# Patient Record
Sex: Female | Born: 1940
Health system: Southern US, Community
[De-identification: ages and names within clinical notes are randomized; demographics above are authoritative.]

## PROBLEM LIST (undated history)

## (undated) DIAGNOSIS — R32 Unspecified urinary incontinence: Secondary | ICD-10-CM

## (undated) DIAGNOSIS — M1990 Unspecified osteoarthritis, unspecified site: Secondary | ICD-10-CM

## (undated) DIAGNOSIS — K269 Duodenal ulcer, unspecified as acute or chronic, without hemorrhage or perforation: Secondary | ICD-10-CM

## (undated) DIAGNOSIS — K429 Umbilical hernia without obstruction or gangrene: Secondary | ICD-10-CM

## (undated) DIAGNOSIS — M722 Plantar fascial fibromatosis: Secondary | ICD-10-CM

## (undated) DIAGNOSIS — R1031 Right lower quadrant pain: Secondary | ICD-10-CM

## (undated) DIAGNOSIS — R7303 Prediabetes: Secondary | ICD-10-CM

## (undated) DIAGNOSIS — I272 Pulmonary hypertension, unspecified: Secondary | ICD-10-CM

## (undated) DIAGNOSIS — G4700 Insomnia, unspecified: Secondary | ICD-10-CM

## (undated) DIAGNOSIS — K219 Gastro-esophageal reflux disease without esophagitis: Secondary | ICD-10-CM

## (undated) DIAGNOSIS — Z981 Arthrodesis status: Secondary | ICD-10-CM

## (undated) DIAGNOSIS — F32A Depression, unspecified: Secondary | ICD-10-CM

## (undated) DIAGNOSIS — J189 Pneumonia, unspecified organism: Secondary | ICD-10-CM

## (undated) DIAGNOSIS — M8580 Other specified disorders of bone density and structure, unspecified site: Secondary | ICD-10-CM

## (undated) DIAGNOSIS — D649 Anemia, unspecified: Secondary | ICD-10-CM

## (undated) DIAGNOSIS — I5189 Other ill-defined heart diseases: Secondary | ICD-10-CM

## (undated) DIAGNOSIS — G4733 Obstructive sleep apnea (adult) (pediatric): Secondary | ICD-10-CM

## (undated) DIAGNOSIS — E119 Type 2 diabetes mellitus without complications: Secondary | ICD-10-CM

## (undated) DIAGNOSIS — G629 Polyneuropathy, unspecified: Secondary | ICD-10-CM

## (undated) DIAGNOSIS — N390 Urinary tract infection, site not specified: Secondary | ICD-10-CM

## (undated) DIAGNOSIS — T8859XA Other complications of anesthesia, initial encounter: Secondary | ICD-10-CM

## (undated) DIAGNOSIS — R0602 Shortness of breath: Secondary | ICD-10-CM

## (undated) DIAGNOSIS — T7840XA Allergy, unspecified, initial encounter: Secondary | ICD-10-CM

## (undated) DIAGNOSIS — Z5189 Encounter for other specified aftercare: Secondary | ICD-10-CM

## (undated) DIAGNOSIS — Z974 Presence of external hearing-aid: Secondary | ICD-10-CM

## (undated) DIAGNOSIS — J449 Chronic obstructive pulmonary disease, unspecified: Secondary | ICD-10-CM

## (undated) DIAGNOSIS — E785 Hyperlipidemia, unspecified: Secondary | ICD-10-CM

## (undated) DIAGNOSIS — G459 Transient cerebral ischemic attack, unspecified: Secondary | ICD-10-CM

## (undated) DIAGNOSIS — G2581 Restless legs syndrome: Secondary | ICD-10-CM

## (undated) DIAGNOSIS — K4090 Unilateral inguinal hernia, without obstruction or gangrene, not specified as recurrent: Secondary | ICD-10-CM

## (undated) DIAGNOSIS — R0600 Dyspnea, unspecified: Secondary | ICD-10-CM

## (undated) DIAGNOSIS — K635 Polyp of colon: Secondary | ICD-10-CM

## (undated) DIAGNOSIS — I10 Essential (primary) hypertension: Secondary | ICD-10-CM

## (undated) DIAGNOSIS — S32010A Wedge compression fracture of first lumbar vertebra, initial encounter for closed fracture: Secondary | ICD-10-CM

## (undated) DIAGNOSIS — J45909 Unspecified asthma, uncomplicated: Secondary | ICD-10-CM

## (undated) DIAGNOSIS — S22080A Wedge compression fracture of T11-T12 vertebra, initial encounter for closed fracture: Secondary | ICD-10-CM

## (undated) DIAGNOSIS — G4730 Sleep apnea, unspecified: Secondary | ICD-10-CM

## (undated) HISTORY — DX: Urinary tract infection, site not specified: N39.0

## (undated) HISTORY — DX: Depression, unspecified: F32.A

## (undated) HISTORY — PX: BACK SURGERY: SHX140

## (undated) HISTORY — DX: Unspecified osteoarthritis, unspecified site: M19.90

## (undated) HISTORY — DX: Hyperlipidemia, unspecified: E78.5

## (undated) HISTORY — DX: Polyp of colon: K63.5

## (undated) HISTORY — DX: Restless legs syndrome: G25.81

## (undated) HISTORY — PX: TUBAL LIGATION: SHX77

## (undated) HISTORY — DX: Type 2 diabetes mellitus without complications: E11.9

## (undated) HISTORY — PX: JOINT REPLACEMENT: SHX530

## (undated) HISTORY — DX: Unspecified urinary incontinence: R32

## (undated) HISTORY — DX: Encounter for other specified aftercare: Z51.89

## (undated) HISTORY — DX: Allergy, unspecified, initial encounter: T78.40XA

## (undated) HISTORY — DX: Shortness of breath: R06.02

## (undated) HISTORY — DX: Prediabetes: R73.03

## (undated) HISTORY — DX: Other specified disorders of bone density and structure, unspecified site: M85.80

## (undated) HISTORY — DX: Duodenal ulcer, unspecified as acute or chronic, without hemorrhage or perforation: K26.9

## (undated) HISTORY — PX: HERNIA REPAIR: SHX51

## (undated) HISTORY — PX: EYE SURGERY: SHX253

## (undated) HISTORY — DX: Gastro-esophageal reflux disease without esophagitis: K21.9

## (undated) HISTORY — DX: Right lower quadrant pain: R10.31

## (undated) HISTORY — DX: Essential (primary) hypertension: I10

---
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
## 1970-02-27 HISTORY — PX: ABDOMINAL HYSTERECTOMY: SHX81

## 1998-09-24 DIAGNOSIS — R7303 Prediabetes: Secondary | ICD-10-CM | POA: Insufficient documentation

## 2000-02-28 HISTORY — PX: SPINAL FUSION: SHX223

## 2000-02-28 HISTORY — PX: BACK SURGERY: SHX140

## 2000-02-28 HISTORY — PX: LUMBAR FUSION: SHX111

## 2000-10-25 DIAGNOSIS — K5730 Diverticulosis of large intestine without perforation or abscess without bleeding: Secondary | ICD-10-CM | POA: Insufficient documentation

## 2012-06-24 DIAGNOSIS — R0602 Shortness of breath: Secondary | ICD-10-CM | POA: Insufficient documentation

## 2012-06-24 DIAGNOSIS — R0609 Other forms of dyspnea: Secondary | ICD-10-CM | POA: Insufficient documentation

## 2016-02-28 HISTORY — PX: HERNIA REPAIR: SHX51

## 2016-07-21 DIAGNOSIS — I2510 Atherosclerotic heart disease of native coronary artery without angina pectoris: Secondary | ICD-10-CM

## 2016-07-21 HISTORY — PX: CARDIAC CATHETERIZATION: SHX172

## 2016-07-21 HISTORY — DX: Atherosclerotic heart disease of native coronary artery without angina pectoris: I25.10

## 2016-12-29 DIAGNOSIS — J449 Chronic obstructive pulmonary disease, unspecified: Secondary | ICD-10-CM | POA: Insufficient documentation

## 2016-12-29 DIAGNOSIS — G4733 Obstructive sleep apnea (adult) (pediatric): Secondary | ICD-10-CM | POA: Insufficient documentation

## 2017-01-05 HISTORY — PX: INGUINAL HERNIA REPAIR: SUR1180

## 2017-01-05 HISTORY — PX: UMBILICAL HERNIA REPAIR: SHX196

## 2017-02-27 HISTORY — PX: LAPAROSCOPIC BILATERAL SALPINGO OOPHERECTOMY: SHX5890

## 2017-02-27 HISTORY — PX: CARDIAC CATHETERIZATION: SHX172

## 2018-01-07 DIAGNOSIS — Z90722 Acquired absence of ovaries, bilateral: Secondary | ICD-10-CM | POA: Insufficient documentation

## 2018-01-07 HISTORY — PX: BILATERAL SALPINGOOPHORECTOMY: SHX1223

## 2018-09-24 ENCOUNTER — Encounter: Payer: Self-pay | Admitting: Gastroenterology

## 2018-10-04 ENCOUNTER — Other Ambulatory Visit: Payer: Self-pay

## 2018-10-04 ENCOUNTER — Encounter: Payer: Self-pay | Admitting: Family Medicine

## 2018-10-04 ENCOUNTER — Ambulatory Visit (INDEPENDENT_AMBULATORY_CARE_PROVIDER_SITE_OTHER): Payer: Medicare Other | Admitting: Family Medicine

## 2018-10-04 VITALS — BP 120/64 | HR 81 | Temp 97.9°F | Ht <= 58 in | Wt 158.9 lb

## 2018-10-04 DIAGNOSIS — E2839 Other primary ovarian failure: Secondary | ICD-10-CM | POA: Diagnosis not present

## 2018-10-04 DIAGNOSIS — R0602 Shortness of breath: Secondary | ICD-10-CM

## 2018-10-04 DIAGNOSIS — Z1231 Encounter for screening mammogram for malignant neoplasm of breast: Secondary | ICD-10-CM

## 2018-10-04 DIAGNOSIS — Z7689 Persons encountering health services in other specified circumstances: Secondary | ICD-10-CM

## 2018-10-04 DIAGNOSIS — Z1239 Encounter for other screening for malignant neoplasm of breast: Secondary | ICD-10-CM

## 2018-10-04 DIAGNOSIS — N3010 Interstitial cystitis (chronic) without hematuria: Secondary | ICD-10-CM | POA: Diagnosis not present

## 2018-10-04 DIAGNOSIS — M79652 Pain in left thigh: Secondary | ICD-10-CM

## 2018-10-04 DIAGNOSIS — R1031 Right lower quadrant pain: Secondary | ICD-10-CM

## 2018-10-04 DIAGNOSIS — G8929 Other chronic pain: Secondary | ICD-10-CM

## 2018-10-04 DIAGNOSIS — G629 Polyneuropathy, unspecified: Secondary | ICD-10-CM

## 2018-10-04 MED ORDER — ALBUTEROL SULFATE HFA 108 (90 BASE) MCG/ACT IN AERS: 2.0000 | INHALATION_SPRAY | RESPIRATORY_TRACT | 1 refills | Status: DC | PRN

## 2018-10-04 NOTE — Patient Instructions (Signed)
Nice to meet you today  I will send your regular medications to [facility]  Please try compression of your left thigh  I have sent in an inhaler to your pharmacy to use prior to exercise  Schedule your Annual wellness visit and a 6 month follow up  You will get a call about your specialist appointments   How to Use a Metered Dose Inhaler A metered dose inhaler is a handheld device for taking medicine that must be breathed into the lungs (inhaled). The device can be used to deliver a variety of inhaled medicines, including:  Quick relief or rescue medicines, such as bronchodilators.  Controller medicines, such as corticosteroids. The medicine is delivered by pushing down on a metal canister to release a preset amount of spray and medicine. Each device contains the amount of medicine that is needed for a preset number of uses (inhalations). Your health care provider may recommend that you use a spacer with your inhaler to help you take the medicine more effectively. A spacer is a plastic tube with a mouthpiece on one end and an opening that connects to the inhaler on the other end. A spacer holds the medicine in a tube for a short time, which allows you to inhale more medicine. What are the risks? If you do not use your inhaler correctly, medicine might not reach your lungs to help you breathe. Inhaler medicine can cause side effects, such as:  Mouth or throat infection.  Cough.  Hoarseness.  Headache.  Nausea and vomiting.  Lung infection (pneumonia) in people who have a lung condition called COPD. How to use a metered dose inhaler without a spacer  1. Remove the cap from the inhaler. 2. If you are using the inhaler for the first time, shake it for 5 seconds, turn it away from your face, then release 4 puffs into the air. This is called priming. 3. Shake the inhaler for 5 seconds. 4. Position the inhaler so the top of the canister faces up. 5. Put your index finger on the top of  the medicine canister. Support the bottom of the inhaler with your thumb. 6. Breathe out normally and as completely as possible, away from the inhaler. 7. Either place the inhaler between your teeth and close your lips tightly around the mouthpiece, or hold the inhaler 1-2 inches (2.5-5 cm) away from your open mouth. Keep your tongue down out of the way. If you are unsure which technique to use, ask your health care provider. 8. Press the canister down with your index finger to release the medicine, then inhale deeply and slowly through your mouth (not your nose) until your lungs are completely filled. Inhaling should take 4-6 seconds. 9. Hold the medicine in your lungs for 5-10 seconds (10 seconds is best). This helps the medicine get into the small airways of your lungs. 10. With your lips in a tight circle (pursed), breathe out slowly. 11. Repeat steps 3-10 until you have taken the number of puffs that your health care provider directed. Wait about 1 minute between puffs or as directed. 12. Put the cap on the inhaler. 13. If you are using a steroid inhaler, rinse your mouth with water, gargle, and spit out the water. Do not swallow the water. How to use a metered dose inhaler with a spacer  1. Remove the cap from the inhaler. 2. If you are using the inhaler for the first time, shake it for 5 seconds, turn it away from your  face, then release 4 puffs into the air. This is called priming. 3. Shake the inhaler for 5 seconds. 4. Place the open end of the spacer onto the inhaler mouthpiece. 5. Position the inhaler so the top of the canister faces up and the spacer mouthpiece faces you. 6. Put your index finger on the top of the medicine canister. Support the bottom of the inhaler and the spacer with your thumb. 7. Breathe out normally and as completely as possible, away from the spacer. 8. Place the spacer between your teeth and close your lips tightly around it. Keep your tongue down out of the way.  9. Press the canister down with your index finger to release the medicine, then inhale deeply and slowly through your mouth (not your nose) until your lungs are completely filled. Inhaling should take 4-6 seconds. 10. Hold the medicine in your lungs for 5-10 seconds (10 seconds is best). This helps the medicine get into the small airways of your lungs. 11. With your lips in a tight circle (pursed), breathe out slowly. 12. Repeat steps 3-11 until you have taken the number of puffs that your health care provider directed. Wait about 1 minute between puffs or as directed. 13. Remove the spacer from the inhaler and put the cap on the inhaler. 14. If you are using a steroid inhaler, rinse your mouth with water, gargle, and spit out the water. Do not swallow the water. Follow these instructions at home:  Take your inhaled medicine only as told by your health care provider. Do not use the inhaler more than directed by your health care provider.  Keep all follow-up visits as told by your health care provider. This is important.  If your inhaler has a counter, you can check it to determine how full your inhaler is. If your inhaler does not have a counter, ask your health care provider when you will need to refill your inhaler and write the refill date on a calendar or on your inhaler canister. Note that you cannot know when an inhaler is empty by shaking it.  Follow directions on the package insert for care and cleaning of your inhaler and spacer. Contact a health care provider if:  Symptoms are only partially relieved with your inhaler.  You are having trouble using your inhaler.  You have an increase in phlegm.  You have headaches. Get help right away if:  You feel little or no relief after using your inhaler.  You have dizziness.  You have a fast heart rate.  You have chills or a fever.  You have night sweats.  There is blood in your phlegm. Summary  A metered dose inhaler is a  handheld device for taking medicine that must be breathed into the lungs (inhaled).  The medicine is delivered by pushing down on a metal canister to release a preset amount of spray and medicine.  Each device contains the amount of medicine that is needed for a preset number of uses (inhalations). This information is not intended to replace advice given to you by your health care provider. Make sure you discuss any questions you have with your health care provider. Document Released: 02/13/2005 Document Revised: 01/26/2017 Document Reviewed: 01/04/2016 Elsevier Patient Education  2020 [employer].

## 2018-10-04 NOTE — Progress Notes (Signed)
Subjective:    Patient ID: Erin Petersen, female    DOB: 1940/05/28, 78 y.o.   MRN: [id number]  HPI This is a 78 yo female who presents today to establish care. She recently moved her from [place]. Lives near her daughter.   Last CPE- 02/2018 Mammo- 10/31/2017 Pap- total hysterectomy Colonoscopy-  2018 Tdap- 2003 Flu- annual Eye- regular Dental- regular Exercise- walks 2x/ day 1 mile with friend  SOB- Since 2014. Always activity induce. Has seen pulmonary. Had slightly abnormal PFTs. Was tried on different inhalers. Has reaction with ? Inhaler. Had cardiac work up- negative cardiac cath. Has been walking 2x a day, feels sob and her friends can hear her breathing and ? Wheezing. No PND. 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   Left leg, mid thigh lateral leg pain- worse with recent with moving. ? Neuropathy, varicose veins. No weakness or falls. Tried IT band, didn't help. Has used lidocaine ointment a couple of times a day, little relief. Worse after walking or standing. Improvement with sitting.   RLS- many years (over 20 years). Good relief with ropinerole. Also on gabapentin for neuropathy.   GERD- good relief with Dexilant. Currently no symptoms.   RLQ pain x several years- mostly at night, worse iwht lying on right side. Has seen GI, surgery, had hernia repairs.   Past Medical History:  Diagnosis Date  . Arthritis   . Colon polyps   . GERD (gastroesophageal reflux disease)   . Hyperlipidemia   . Hypertension   . Prediabetes   . RLS (restless legs syndrome)   . SOB (shortness of breath)   . Urinary incontinence   . Urinary tract infection    Past Surgical History:  Procedure Laterality Date  . ABDOMINAL HYSTERECTOMY  1972  . APPENDECTOMY  1958  . TONSILLECTOMY AND ADENOIDECTOMY  1961   History reviewed. No pertinent family history. Social History   Tobacco Use  . Smoking status: Never Smoker  . Smokeless tobacco: Never Used  Substance Use Topics  . Alcohol  use: Yes    Comment: rare  . Drug use: Never    Review of Systems Per HPI    Objective:   Physical Exam Vitals signs reviewed.  Constitutional:      General: She is not in acute distress.    Appearance: Normal appearance. She is obese. She is not ill-appearing, toxic-appearing or diaphoretic.  HENT:     Head: Normocephalic and atraumatic.  Cardiovascular:     Rate and Rhythm: Normal rate and regular rhythm.     Heart sounds: Normal heart sounds.  Pulmonary:     Effort: Pulmonary effort is normal.     Breath sounds: Normal breath sounds.  Musculoskeletal:     Comments: Left upper anterior thigh with mild tenderness to palpation. No erythema, swelling or deformity.   Skin:    General: Skin is warm and dry.  Neurological:     Mental Status: She is alert and oriented to person, place, and time.       BP 120/64 (BP Location: Left Arm, Patient Position: Sitting, Cuff Size: Normal)   Pulse 81   Temp 97.9 F (36.6 C) (Temporal)   Ht 4' 9.87" (1.47 m)   Wt 158 lb 14.4 oz (72.1 kg)   SpO2 97%   BMI 33.35 kg/m      Assessment & Plan:  1. Encounter to establish care - records visible in Care Everywhere - Reviewed health maintenance and provided recommendations -  referrals for ongoing specialty care made  2. Screening for breast cancer - Screening mammogram  3. Estrogen deficiency - HM DEXA SCAN; Future  4. Interstitial cystitis - Ambulatory referral to Urology  5. Chronic right lower quadrant pain - Ambulatory referral to Gastroenterology  6. Neuropathy - Ambulatory referral to Neurology  7. Left thigh pain - likely strain, discussed heat/ice, compression  8. SOB with exertion - longstanding problem with prior work up by pulmonary and cardiology - will have her try pre exercise albuterol - discussed use, possible side effects  - follow up in 6 months for AWV/follow up Clarene Reamer, FNP-BC  [facility], [place]  10/08/2018 10:13 AM

## 2018-10-08 ENCOUNTER — Encounter: Payer: Self-pay | Admitting: Family Medicine

## 2018-10-08 DIAGNOSIS — R3982 Chronic bladder pain: Secondary | ICD-10-CM | POA: Insufficient documentation

## 2018-10-08 DIAGNOSIS — M1990 Unspecified osteoarthritis, unspecified site: Secondary | ICD-10-CM | POA: Insufficient documentation

## 2018-10-08 DIAGNOSIS — G629 Polyneuropathy, unspecified: Secondary | ICD-10-CM | POA: Insufficient documentation

## 2018-10-08 DIAGNOSIS — J309 Allergic rhinitis, unspecified: Secondary | ICD-10-CM | POA: Insufficient documentation

## 2018-10-08 DIAGNOSIS — I10 Essential (primary) hypertension: Secondary | ICD-10-CM | POA: Insufficient documentation

## 2018-10-09 ENCOUNTER — Other Ambulatory Visit: Payer: Self-pay | Admitting: Family Medicine

## 2018-10-09 ENCOUNTER — Encounter: Payer: Self-pay | Admitting: Family Medicine

## 2018-10-09 MED ORDER — ROPINIROLE HCL 0.5 MG PO TABS: 0.5000 mg | ORAL_TABLET | Freq: Three times a day (TID) | ORAL | 3 refills | Status: DC

## 2018-10-09 MED ORDER — ESTRADIOL 0.1 MG/GM VA CREA: 1.0000 | TOPICAL_CREAM | VAGINAL | 3 refills | Status: AC

## 2018-10-09 MED ORDER — MELOXICAM 15 MG PO TABS: 15.0000 mg | ORAL_TABLET | Freq: Every day | ORAL | 3 refills | Status: DC | PRN

## 2018-10-09 MED ORDER — SIMVASTATIN 10 MG PO TABS: 10.0000 mg | ORAL_TABLET | Freq: Every day | ORAL | 3 refills | Status: DC

## 2018-10-09 MED ORDER — HYDROXYZINE HCL 25 MG PO TABS: 25.0000 mg | ORAL_TABLET | Freq: Two times a day (BID) | ORAL | 3 refills | Status: DC

## 2018-10-09 MED ORDER — GABAPENTIN 300 MG PO CAPS: 900.0000 mg | ORAL_CAPSULE | Freq: Two times a day (BID) | ORAL | 3 refills | Status: DC

## 2018-10-09 MED ORDER — HYDROCHLOROTHIAZIDE 25 MG PO TABS: 25.0000 mg | ORAL_TABLET | Freq: Every day | ORAL | 1 refills | Status: DC

## 2018-10-09 MED ORDER — AMITRIPTYLINE HCL 25 MG PO TABS: 25.0000 mg | ORAL_TABLET | Freq: Every day | ORAL | 3 refills | Status: DC

## 2018-10-09 MED ORDER — AMLODIPINE BESYLATE 5 MG PO TABS: 5.0000 mg | ORAL_TABLET | Freq: Every day | ORAL | 3 refills | Status: DC

## 2018-10-09 MED ORDER — DEXILANT 60 MG PO CPDR: 60.0000 mg | DELAYED_RELEASE_CAPSULE | Freq: Every day | ORAL | 1 refills | Status: DC

## 2018-10-09 NOTE — Progress Notes (Signed)
Patient's regular medications sent to her mail order pharmacy per her request.

## 2018-10-24 ENCOUNTER — Ambulatory Visit: Payer: Self-pay | Admitting: Gastroenterology

## 2018-10-31 ENCOUNTER — Other Ambulatory Visit: Payer: Self-pay

## 2018-10-31 ENCOUNTER — Encounter: Payer: Self-pay | Admitting: Gastroenterology

## 2018-10-31 ENCOUNTER — Ambulatory Visit (INDEPENDENT_AMBULATORY_CARE_PROVIDER_SITE_OTHER): Payer: Medicare Other | Admitting: Gastroenterology

## 2018-10-31 VITALS — BP 160/74 | HR 72 | Temp 98.1°F | Resp 17 | Ht <= 58 in | Wt 158.8 lb

## 2018-10-31 DIAGNOSIS — R1031 Right lower quadrant pain: Secondary | ICD-10-CM

## 2018-10-31 DIAGNOSIS — R933 Abnormal findings on diagnostic imaging of other parts of digestive tract: Secondary | ICD-10-CM | POA: Diagnosis not present

## 2018-10-31 DIAGNOSIS — K5000 Crohn's disease of small intestine without complications: Secondary | ICD-10-CM

## 2018-10-31 MED ORDER — PREDNISONE 10 MG PO TABS: ORAL_TABLET | ORAL | 0 refills | Status: DC

## 2018-10-31 NOTE — Progress Notes (Signed)
Erin Spinner, MD [address]  [place]  [place], [place] [address]  Main: [phone]  Fax: [phone]    Gastroenterology Consultation  Referring Provider:     Elby Beck, FNP Primary Care Physician:  Elby Beck, FNP Primary Gastroenterologist:  Dr. [name] Reason for Consultation:     Right lower quadrant pain, abnormal CT ileum        HPI:   Erin Petersen is a 78 y.o. female referred by Dr. Carlean Purl, Dalbert Batman, FNP  for consultation & management of right lower quadrant pain.  Patient reports having approximately 4 years history of right lower quadrant pain that has progressed over years.  She recently moved from [place] to live with her daughter in 08/2018.  Over the last 4 years, she was evaluated by general surgery and was found to have inguinal hernias which were thought to have caused the pain, underwent hernia repair.  The pain persisted, later evaluated by OB/GYN for ovarian cyst and underwent bilateral oophorectomy.  The pain still persisted.  She then saw urology for possible bladder etiology as she was having urinary urgency nocturnally.  She underwent CT abdomen and pelvis in [place] and was found to have thickening of the terminal ileum, mesenteric edema as well as fluid in the cul-de-sac.  Therefore, she is referred to GI for further evaluation and rule out Crohn's.  Patient reports that the pain has become progressive in last 1 year, worse lying on right side, wakes her up from sleep.  Patient has been taking Advil at night in addition to meloxicam.  She also describes fatigue, large joint arthralgias.  She never smoked in her life.  Does not drink alcohol.  The severity of pain has significantly impacted her physical activity Patient denies having exposure to TB.  She started on amitriptyline, meloxicam and hydroxyzine for her lower abdominal pain.  Patient also reports localized patchy area of burning and tingling in her left  lateral upper thigh  NSAIDs: Advil and meloxicam daily  Antiplts/Anticoagulants/Anti thrombotics: None  GI Procedures: Colonoscopy in 2018, reportedly normal  Past Medical History:  Diagnosis Date  . Arthritis   . Colon polyps   . GERD (gastroesophageal reflux disease)   . Hyperlipidemia   . Hypertension   . Prediabetes   . RLS (restless legs syndrome)   . SOB (shortness of breath)   . Urinary incontinence   . Urinary tract infection     Past Surgical History:  Procedure Laterality Date  . ABDOMINAL HYSTERECTOMY  1972  . APPENDECTOMY  1958  . BILATERAL SALPINGOOPHORECTOMY  01/07/2018  . HERNIA REPAIR     1982 (x1), 2018(x3)  . SPINAL FUSION  2002  . TONSILLECTOMY AND ADENOIDECTOMY  1961    Current Outpatient Medications:  .  albuterol (VENTOLIN HFA) 108 (90 Base) MCG/ACT inhaler, Inhale 2 puffs into the lungs every 4 (four) hours as needed for wheezing or shortness of breath (cough, shortness of breath or wheezing.)., Disp: 16 g, Rfl: 1 .  amitriptyline (ELAVIL) 25 MG tablet, Take 1 tablet (25 mg total) by mouth at bedtime., Disp: 90 tablet, Rfl: 3 .  amLODipine (NORVASC) 5 MG tablet, Take 1 tablet (5 mg total) by mouth daily., Disp: 90 tablet, Rfl: 3 .  aspirin 81 MG chewable tablet, Chew 81 mg DAILY., Disp: , Rfl:  .  Cholecalciferol 50 MCG (2000 UT) TABS, Take by mouth., Disp: , Rfl:  .  DEXILANT 60 MG capsule, Take 1  capsule (60 mg total) by mouth daily., Disp: 90 capsule, Rfl: 1 .  EPINEPHrine 0.3 mg/0.3 mL IJ SOAJ injection, Inject into the muscle., Disp: , Rfl:  .  estradiol (ESTRACE) 0.1 MG/GM vaginal cream, Place 1 Applicatorful vaginally 3 (three) times a week., Disp: 42.5 g, Rfl: 3 .  gabapentin (NEURONTIN) 300 MG capsule, Take 3-4 capsules (900-1,200 mg total) by mouth 2 (two) times daily. Take 3 tablets in am, 4 tablets pm, Disp: 630 capsule, Rfl: 3 .  hydrochlorothiazide (HYDRODIURIL) 25 MG tablet, Take 1 tablet (25 mg total) by mouth daily., Disp: 90 tablet,  Rfl: 1 .  hydrOXYzine (ATARAX/VISTARIL) 25 MG tablet, Take 1 tablet (25 mg total) by mouth 2 (two) times daily., Disp: 180 tablet, Rfl: 3 .  loratadine (CLARITIN) 10 MG tablet, Take by mouth., Disp: , Rfl:  .  Melatonin (MELATONIN MAXIMUM STRENGTH) 5 MG TABS, Take by mouth., Disp: , Rfl:  .  meloxicam (MOBIC) 15 MG tablet, Take 1 tablet (15 mg total) by mouth daily as needed., Disp: 90 tablet, Rfl: 3 .  polyethylene glycol (MIRALAX / GLYCOLAX) 17 g packet, Take 1 packet by mouth daily as needed., Disp: , Rfl:  .  rOPINIRole (REQUIP) 0.5 MG tablet, Take 1-3 tablets (0.5-1.5 mg total) by mouth 3 (three) times daily. Take 1 tablet am, 1 pm and 3 tablets at bedtime, Disp: 450 tablet, Rfl: 3 .  simvastatin (ZOCOR) 10 MG tablet, Take 1 tablet (10 mg total) by mouth daily before supper., Disp: 90 tablet, Rfl: 3 .  vitamin B-12 (CYANOCOBALAMIN) 1000 MCG tablet, Take by mouth., Disp: , Rfl:  .  diclofenac sodium (VOLTAREN) 1 % GEL, Apply 1 application topically 2 (two) times daily as needed., Disp: , Rfl:  .  predniSONE (DELTASONE) 10 MG tablet, Take 4pills daily for 5 days then 3 pills daily for 5 days, 2 pills daily for 5 days, 1 pill daily for 5 days, Disp: 60 tablet, Rfl: 0   Family History  Problem Relation Age of Onset  . Alcohol abuse Mother   . Cancer Mother   . Stroke Mother   . Alcohol abuse Father   . Arthritis Sister   . Arthritis Daughter   . COPD Daughter   . Arthritis Paternal Grandmother   . Asthma Paternal Grandmother   . COPD Paternal Grandmother      Social History   Tobacco Use  . Smoking status: Never Smoker  . Smokeless tobacco: Never Used  Substance Use Topics  . Alcohol use: Yes    Comment: rare  . Drug use: Never    Allergies as of 10/31/2018 - Review Complete 10/31/2018  Allergen Reaction Noted  . Bee pollen Hives 12/19/2009  . Ciprofloxacin hcl  12/29/2016  . Sulfa antibiotics Rash 06/10/2012    Review of Systems:    All systems reviewed and negative  except where noted in HPI.   Physical Exam:  BP (!) 160/74 (BP Location: Left Arm, Patient Position: Sitting, Cuff Size: Large)   Pulse 72   Temp 98.1 F (36.7 C)   Resp 17   Ht 4' 9.87" (1.47 m)   Wt 158 lb 12.8 oz (72 kg)   BMI 33.34 kg/m  No LMP recorded.  General:   Alert,  Well-developed, well-nourished, pleasant and cooperative in NAD Head:  Normocephalic and atraumatic. Eyes:  Sclera clear, no icterus.   Conjunctiva pink. Ears:  Normal auditory acuity. Nose:  No deformity, discharge, or lesions. Mouth:  No deformity or lesions,oropharynx pink &  moist. Neck:  Supple; no masses or thyromegaly. Lungs:  Respirations even and unlabored.  Clear throughout to auscultation.   No wheezes, crackles, or rhonchi. No acute distress. Heart:  Regular rate and rhythm; no murmurs, clicks, rubs, or gallops. Abdomen:  Normal bowel sounds. Soft, mild tenderness in the suprapubic and right lower quadrant and non-distended without masses, hepatosplenomegaly or hernias noted.  No guarding or rebound tenderness.   Rectal: Not performed Msk:  Symmetrical without gross deformities. Good, equal movement & strength bilaterally. Pulses:  Normal pulses noted. Extremities:  No clubbing or edema.  No cyanosis. Neurologic:  Alert and oriented x3;  grossly normal neurologically. Skin:  Intact without significant lesions or rashes. No jaundice. Psych:  Alert and cooperative. Normal mood and affect.  Imaging Studies: Reviewed  Assessment and Plan:   Erin Petersen is a 78 y.o. female with history of hypertension, chronic right lower quadrant pain, history of hernia repair, bilateral oophorectomy seen in consultation for chronic right lower quadrant pain, CT abdomen concerning for ileitis.  Differentials include Crohn's disease or lymphoma or TB enteritis or NSAID induced enteropathy  Recommend colonoscopy with TI evaluation Will hold off on prednisone at this time until colonoscopy is performed Check  CRP, iron studies, B12 and folate levels given chronic fatigue Strongly advised her to avoid NSAIDs if possible   Follow up in 2 to 3 weeks   [name], MD

## 2018-11-01 ENCOUNTER — Other Ambulatory Visit: Payer: Self-pay | Admitting: Gastroenterology

## 2018-11-01 ENCOUNTER — Encounter: Payer: Self-pay | Admitting: Gastroenterology

## 2018-11-01 DIAGNOSIS — D508 Other iron deficiency anemias: Secondary | ICD-10-CM

## 2018-11-01 LAB — IRON AND TIBC
Iron Saturation: 13 % — ABNORMAL LOW (ref 15–55)
Iron: 56 ug/dL (ref 27–139)
Total Iron Binding Capacity: 432 ug/dL (ref 250–450)
UIBC: 376 ug/dL — ABNORMAL HIGH (ref 118–369)

## 2018-11-01 LAB — C-REACTIVE PROTEIN: CRP: <1 mg/L (ref 0–10)

## 2018-11-01 LAB — FERRITIN: Ferritin: 11 ng/mL — ABNORMAL LOW (ref 15–150)

## 2018-11-01 LAB — VITAMIN B12: Vitamin B-12: 782 pg/mL (ref 232–1245)

## 2018-11-01 MED ORDER — FUSION PLUS PO CAPS: 1.0000 | ORAL_CAPSULE | Freq: Every day | ORAL | 2 refills | Status: AC

## 2018-11-01 NOTE — Telephone Encounter (Signed)
Called patient to hold off on prednisone.  Also, discussed with her about iron deficiency and sent in prescription for fusion plus.  She is willing to try  [name], MD [address]  [place]  [place], [place] [address]  Main: [phone]  Fax: [phone] Pager: [phone]

## 2018-11-07 ENCOUNTER — Other Ambulatory Visit: Payer: Self-pay

## 2018-11-07 ENCOUNTER — Other Ambulatory Visit
Admission: RE | Admit: 2018-11-07 | Discharge: 2018-11-07 | Disposition: A | Discharge status: Home / Self Care | Payer: Medicare Other | Source: Ambulatory Visit | Attending: Gastroenterology | Admitting: Gastroenterology

## 2018-11-07 DIAGNOSIS — Z01812 Encounter for preprocedural laboratory examination: Secondary | ICD-10-CM | POA: Insufficient documentation

## 2018-11-07 DIAGNOSIS — K635 Polyp of colon: Secondary | ICD-10-CM | POA: Insufficient documentation

## 2018-11-07 DIAGNOSIS — Z20828 Contact with and (suspected) exposure to other viral communicable diseases: Secondary | ICD-10-CM | POA: Diagnosis not present

## 2018-11-07 LAB — SARS CORONAVIRUS 2 (TAT 6-24 HRS): SARS Coronavirus 2: NEGATIVE

## 2018-11-08 ENCOUNTER — Encounter: Payer: Self-pay | Admitting: *Deleted

## 2018-11-11 ENCOUNTER — Ambulatory Visit
Admission: RE | Admit: 2018-11-11 | Discharge: 2018-11-11 | Disposition: A | Discharge status: Home / Self Care | Payer: Medicare Other | Attending: Gastroenterology | Admitting: Gastroenterology

## 2018-11-11 ENCOUNTER — Ambulatory Visit: Payer: Medicare Other | Admitting: Anesthesiology

## 2018-11-11 ENCOUNTER — Encounter: Payer: Self-pay | Admitting: *Deleted

## 2018-11-11 ENCOUNTER — Other Ambulatory Visit: Payer: Self-pay

## 2018-11-11 ENCOUNTER — Encounter
Admission: RE | Disposition: A | Discharge status: Home / Self Care | Payer: Self-pay | Source: Home / Self Care | Attending: Gastroenterology

## 2018-11-11 DIAGNOSIS — R7303 Prediabetes: Secondary | ICD-10-CM | POA: Diagnosis not present

## 2018-11-11 DIAGNOSIS — K219 Gastro-esophageal reflux disease without esophagitis: Secondary | ICD-10-CM | POA: Insufficient documentation

## 2018-11-11 DIAGNOSIS — R635 Abnormal weight gain: Secondary | ICD-10-CM | POA: Diagnosis not present

## 2018-11-11 DIAGNOSIS — D124 Benign neoplasm of descending colon: Secondary | ICD-10-CM | POA: Diagnosis not present

## 2018-11-11 DIAGNOSIS — R1031 Right lower quadrant pain: Secondary | ICD-10-CM | POA: Diagnosis not present

## 2018-11-11 DIAGNOSIS — Z79899 Other long term (current) drug therapy: Secondary | ICD-10-CM | POA: Diagnosis not present

## 2018-11-11 DIAGNOSIS — G2581 Restless legs syndrome: Secondary | ICD-10-CM | POA: Diagnosis not present

## 2018-11-11 DIAGNOSIS — Z791 Long term (current) use of non-steroidal anti-inflammatories (NSAID): Secondary | ICD-10-CM | POA: Diagnosis not present

## 2018-11-11 DIAGNOSIS — D122 Benign neoplasm of ascending colon: Secondary | ICD-10-CM | POA: Diagnosis not present

## 2018-11-11 DIAGNOSIS — M1990 Unspecified osteoarthritis, unspecified site: Secondary | ICD-10-CM | POA: Insufficient documentation

## 2018-11-11 DIAGNOSIS — E785 Hyperlipidemia, unspecified: Secondary | ICD-10-CM | POA: Insufficient documentation

## 2018-11-11 DIAGNOSIS — I10 Essential (primary) hypertension: Secondary | ICD-10-CM | POA: Insufficient documentation

## 2018-11-11 DIAGNOSIS — R948 Abnormal results of function studies of other organs and systems: Secondary | ICD-10-CM | POA: Insufficient documentation

## 2018-11-11 DIAGNOSIS — Z7982 Long term (current) use of aspirin: Secondary | ICD-10-CM | POA: Insufficient documentation

## 2018-11-11 HISTORY — PX: COLONOSCOPY WITH PROPOFOL: SHX5780

## 2018-11-11 SURGERY — COLONOSCOPY WITH PROPOFOL
Anesthesia: General

## 2018-11-11 MED ORDER — PROPOFOL 500 MG/50ML IV EMUL
INTRAVENOUS | Status: AC
  Filled 2018-11-11: qty 50

## 2018-11-11 MED ORDER — PROPOFOL 10 MG/ML IV BOLUS
INTRAVENOUS | Status: DC | PRN
  Administered 2018-11-11: 20 mg via INTRAVENOUS
  Administered 2018-11-11 (×2): 40 mg via INTRAVENOUS

## 2018-11-11 MED ORDER — LIDOCAINE HCL (PF) 2 % IJ SOLN
INTRAMUSCULAR | Status: AC
  Filled 2018-11-11: qty 10

## 2018-11-11 MED ORDER — DICYCLOMINE HCL 10 MG PO CAPS: 10.0000 mg | ORAL_CAPSULE | Freq: Every day | ORAL | 0 refills | Status: DC

## 2018-11-11 MED ORDER — LIDOCAINE HCL (CARDIAC) PF 100 MG/5ML IV SOSY
PREFILLED_SYRINGE | INTRAVENOUS | Status: DC | PRN
  Administered 2018-11-11: 50 mg via INTRAVENOUS

## 2018-11-11 MED ORDER — SODIUM CHLORIDE 0.9 % IV SOLN
INTRAVENOUS | Status: DC
  Administered 2018-11-11: 10:00:00 1000 mL via INTRAVENOUS

## 2018-11-11 MED ORDER — PROPOFOL 500 MG/50ML IV EMUL
INTRAVENOUS | Status: DC | PRN
  Administered 2018-11-11: 150 ug/kg/min via INTRAVENOUS

## 2018-11-11 NOTE — Anesthesia Procedure Notes (Signed)
Date/Time: 11/11/2018 10:23 AM Performed by: Johnna Acosta, CRNA Pre-anesthesia Checklist: Patient identified, Emergency Drugs available, Suction available, Patient being monitored and Timeout performed Patient Re-evaluated:Patient Re-evaluated prior to induction Oxygen Delivery Method: Nasal cannula Preoxygenation: Pre-oxygenation with 100% oxygen Induction Type: IV induction

## 2018-11-11 NOTE — Anesthesia Postprocedure Evaluation (Signed)
Anesthesia Post Note  Patient: Erin Petersen  Procedure(s) Performed: COLONOSCOPY WITH PROPOFOL (N/A )  Patient location during evaluation: Endoscopy Anesthesia Type: General Level of consciousness: awake and alert Pain management: pain level controlled Vital Signs Assessment: post-procedure vital signs reviewed and stable Respiratory status: spontaneous breathing and respiratory function stable Cardiovascular status: stable Anesthetic complications: no     Last Vitals:  Vitals:   11/11/18 1131 11/11/18 1141  BP: (!) 158/77 (!) 170/69  Pulse: 60 60  Resp: 14   Temp:    SpO2: 99%     Last Pain:  Vitals:   11/11/18 1141  TempSrc:   PainSc: 0-No pain                 KEPHART,WILLIAM K

## 2018-11-11 NOTE — Anesthesia Preprocedure Evaluation (Signed)
Anesthesia Evaluation  Patient identified by MRN, date of birth, ID band Patient awake    Reviewed: Allergy & Precautions, NPO status , Patient's Chart, lab work & pertinent test results  History of Anesthesia Complications Negative for: history of anesthetic complications  Airway Mallampati: I       Dental   Pulmonary sleep apnea and Continuous Positive Airway Pressure Ventilation , COPD,  COPD inhaler, Not current smoker,           Cardiovascular hypertension, Pt. on medications (-) Past MI and (-) CHF (-) dysrhythmias (-) Valvular Problems/Murmurs     Neuro/Psych neg Seizures    GI/Hepatic Neg liver ROS, GERD  Medicated,  Endo/Other  neg diabetes  Renal/GU negative Renal ROS     Musculoskeletal   Abdominal   Peds  Hematology   Anesthesia Other Findings   Reproductive/Obstetrics                             Anesthesia Physical Anesthesia Plan  ASA: III  Anesthesia Plan: General   Post-op Pain Management:    Induction: Intravenous  PONV Risk Score and Plan: 3 and Propofol infusion, TIVA and Ondansetron  Airway Management Planned: Nasal Cannula  Additional Equipment:   Intra-op Plan:   Post-operative Plan:   Informed Consent: I have reviewed the patients History and Physical, chart, labs and discussed the procedure including the risks, benefits and alternatives for the proposed anesthesia with the patient or authorized representative who has indicated his/her understanding and acceptance.       Plan Discussed with:   Anesthesia Plan Comments:         Anesthesia Quick Evaluation

## 2018-11-11 NOTE — Anesthesia Post-op Follow-up Note (Signed)
Anesthesia QCDR form completed.        

## 2018-11-11 NOTE — H&P (Signed)
[name], MD [address]  [place], [place] [address]  Main: [phone]  Fax: [phone] Pager: [phone]  Primary Care Physician:  Elby Beck, FNP Primary Gastroenterologist:  Dr. [name]  Pre-Procedure History & Physical: HPI:  Erin Petersen is a 78 y.o. female is here for an colonoscopy.   Past Medical History:  Diagnosis Date  . Arthritis   . Colon polyps   . GERD (gastroesophageal reflux disease)   . Hyperlipidemia   . Hypertension   . Prediabetes   . RLS (restless legs syndrome)   . SOB (shortness of breath)   . Urinary incontinence   . Urinary tract infection     Past Surgical History:  Procedure Laterality Date  . ABDOMINAL HYSTERECTOMY  1972  . APPENDECTOMY  1958  . BACK SURGERY    . BILATERAL SALPINGOOPHORECTOMY  01/07/2018  . HERNIA REPAIR     1982 (x1), 2018(x3)  . SPINAL FUSION  2002  . TONSILLECTOMY AND ADENOIDECTOMY  1961    Prior to Admission medications   Medication Sig Start Date End Date Taking? Authorizing Provider  amitriptyline (ELAVIL) 25 MG tablet Take 1 tablet (25 mg total) by mouth at bedtime. 10/09/18  Yes Elby Beck, FNP  amLODipine (NORVASC) 5 MG tablet Take 1 tablet (5 mg total) by mouth daily. 10/09/18  Yes Elby Beck, FNP  aspirin 81 MG chewable tablet Chew 81 mg DAILY.   Yes [provider]  Cholecalciferol 50 MCG (2000 UT) TABS Take by mouth.   Yes [provider]  DEXILANT 60 MG capsule Take 1 capsule (60 mg total) by mouth daily. 10/09/18  Yes Elby Beck, FNP  diclofenac sodium (VOLTAREN) 1 % GEL Apply 1 application topically 2 (two) times daily as needed.   Yes [provider]  estradiol (ESTRACE) 0.1 MG/GM vaginal cream Place 1 Applicatorful vaginally 3 (three) times a week. 10/09/18 10/09/19 Yes Elby Beck, FNP  gabapentin (NEURONTIN) 300 MG capsule Take 3-4 capsules (900-1,200 mg total) by mouth 2 (two) times daily. Take 3 tablets  in am, 4 tablets pm 10/09/18  Yes Elby Beck, FNP  hydrochlorothiazide (HYDRODIURIL) 25 MG tablet Take 1 tablet (25 mg total) by mouth daily. 10/09/18  Yes Elby Beck, FNP  hydrOXYzine (ATARAX/VISTARIL) 25 MG tablet Take 1 tablet (25 mg total) by mouth 2 (two) times daily. 10/09/18  Yes Elby Beck, FNP  Iron-FA-B Cmp-C-Biot-Probiotic (FUSION PLUS) CAPS Take 1 capsule by mouth daily with breakfast. 11/01/18 12/01/18 Yes Vanga, Tally Due, MD  loratadine (CLARITIN) 10 MG tablet Take by mouth.   Yes [provider]  Melatonin (MELATONIN MAXIMUM STRENGTH) 5 MG TABS Take by mouth.   Yes [provider]  polyethylene glycol (MIRALAX / GLYCOLAX) 17 g packet Take 1 packet by mouth daily as needed.   Yes [provider]  rOPINIRole (REQUIP) 0.5 MG tablet Take 1-3 tablets (0.5-1.5 mg total) by mouth 3 (three) times daily. Take 1 tablet am, 1 pm and 3 tablets at bedtime 10/09/18  Yes Elby Beck, FNP  simvastatin (ZOCOR) 10 MG tablet Take 1 tablet (10 mg total) by mouth daily before supper. 10/09/18  Yes Elby Beck, FNP  vitamin B-12 (CYANOCOBALAMIN) 1000 MCG tablet Take by mouth.   Yes [provider]  albuterol (VENTOLIN HFA) 108 (90 Base) MCG/ACT inhaler Inhale 2 puffs into the lungs every 4 (four) hours as needed for wheezing or shortness of breath (cough, shortness  of breath or wheezing.). 10/04/18   Elby Beck, FNP  EPINEPHrine 0.3 mg/0.3 mL IJ SOAJ injection Inject into the muscle. 07/20/14   [provider]  meloxicam (MOBIC) 15 MG tablet Take 1 tablet (15 mg total) by mouth daily as needed. Patient not taking: Reported on 11/11/2018 10/09/18   Elby Beck, FNP  predniSONE (DELTASONE) 10 MG tablet Take 4pills daily for 5 days then 3 pills daily for 5 days, 2 pills daily for 5 days, 1 pill daily for 5 days Patient not taking: Reported on 11/11/2018 10/31/18   [name], MD    Allergies as of 10/31/2018 -  Review Complete 10/31/2018  Allergen Reaction Noted  . Bee pollen Hives 12/19/2009  . Ciprofloxacin hcl  12/29/2016  . Sulfa antibiotics Rash 06/10/2012    Family History  Problem Relation Age of Onset  . Alcohol abuse Mother   . Cancer Mother   . Stroke Mother   . Alcohol abuse Father   . Arthritis Sister   . Arthritis Daughter   . COPD Daughter   . Arthritis Paternal Grandmother   . Asthma Paternal Grandmother   . COPD Paternal Grandmother     Social History   Socioeconomic History  . Marital status: Widowed    Spouse name: Not on file  . Number of children: 3  . Years of education: Not on file  . Highest education level: Bachelor's degree (e.g., BA, AB, BS)  Occupational History  . Not on file  Social Needs  . Financial resource strain: Not on file  . Food insecurity    Worry: Not on file    Inability: Not on file  . Transportation needs    Medical: Not on file    Non-medical: Not on file  Tobacco Use  . Smoking status: Never Smoker  . Smokeless tobacco: Never Used  Substance and Sexual Activity  . Alcohol use: Yes    Comment: rare  . Drug use: Never  . Sexual activity: Not Currently  Lifestyle  . Physical activity    Days per week: Not on file    Minutes per session: Not on file  . Stress: Not on file  Relationships  . Social Herbalist on phone: Not on file    Gets together: Not on file    Attends religious service: Not on file    Active member of club or organization: Not on file    Attends meetings of clubs or organizations: Not on file    Relationship status: Not on file  . Intimate partner violence    Fear of current or ex partner: Not on file    Emotionally abused: Not on file    Physically abused: Not on file    Forced sexual activity: Not on file  Other Topics Concern  . Not on file  Social History Narrative   Moved from [place]. 2020, lives near daughter. Retired. Enjoys walking.     Review of Systems: See HPI, otherwise  negative ROS  Physical Exam: BP (!) 151/75   Pulse 72   Temp (!) 96.4 F (35.8 C) (Tympanic)   Resp 16   Ht 4' 10"  (1.473 m)   Wt 70.3 kg   LMP  (LMP Unknown)   SpO2 100%   BMI 32.40 kg/m  General:   Alert,  pleasant and cooperative in NAD Head:  Normocephalic and atraumatic. Neck:  Supple; no masses or thyromegaly. Lungs:  Clear throughout to auscultation.  Heart:  Regular rate and rhythm. Abdomen:  Soft, nontender and nondistended. Normal bowel sounds, without guarding, and without rebound.   Neurologic:  Alert and  oriented x4;  grossly normal neurologically.  Impression/Plan: Erin Petersen is here for an colonoscopy to be performed for RLQ pain, abnormal CT  Risks, benefits, limitations, and alternatives regarding  colonoscopy have been reviewed with the patient.  Questions have been answered.  All parties agreeable.   Sherri Sear, MD  11/11/2018, 10:08 AM

## 2018-11-11 NOTE — Op Note (Signed)
[facility] Gastroenterology Patient Name: Erin Petersen Procedure Date: 11/11/2018 10:13 AM MRN: [id number] Account #: [id number] Date of Birth: 12/25/1940 Admit Type: Outpatient Age: 78 [facility]: [facility] ENDO ROOM 3 Gender: Female Note Status: Finalized Procedure:            Colonoscopy Indications:          Abdominal pain in the right lower quadrant, Abnormal CT                        of the GI tract Providers:            [name] MD, MD Referring MD:         Elby Beck (Referring MD) Medicines:            Monitored Anesthesia Care Complications:        No immediate complications. Estimated blood loss: None. Procedure:            Pre-Anesthesia Assessment:                       - Prior to the procedure, a History and Physical was                        performed, and patient medications and allergies were                        reviewed. The patient is competent. The risks and                        benefits of the procedure and the sedation options and                        risks were discussed with the patient. All questions                        were answered and informed consent was obtained.                        Patient identification and proposed procedure were                        verified by the physician, the nurse, the                        anesthesiologist, the anesthetist and the technician in                        the pre-procedure area in the procedure room in the                        endoscopy suite. Mental Status Examination: alert and                        oriented. Airway Examination: normal oropharyngeal                        airway and neck mobility. Respiratory Examination:                        clear to auscultation. CV Examination: normal.  Prophylactic Antibiotics: The patient does not require                        prophylactic antibiotics. Prior Anticoagulants: The                         patient has taken no previous anticoagulant or                        antiplatelet agents. ASA Grade Assessment: III - A                        patient with severe systemic disease. After reviewing                        the risks and benefits, the patient was deemed in                        satisfactory condition to undergo the procedure. The                        anesthesia plan was to use monitored anesthesia care                        (MAC). Immediately prior to administration of                        medications, the patient was re-assessed for adequacy                        to receive sedatives. The heart rate, respiratory rate,                        oxygen saturations, blood pressure, adequacy of                        pulmonary ventilation, and response to care were                        monitored throughout the procedure. The physical status                        of the patient was re-assessed after the procedure.                       After obtaining informed consent, the colonoscope was                        passed under direct vision. Throughout the procedure,                        the patient's blood pressure, pulse, and oxygen                        saturations were monitored continuously. The                        Colonoscope was introduced through the anus and  advanced to the the terminal ileum, with identification                        of the appendiceal orifice and IC valve. The                        colonoscopy was unusually difficult due to significant                        looping and the patient's body habitus. Successful                        completion of the procedure was aided by applying                        abdominal pressure. The patient tolerated the procedure                        well. The quality of the bowel preparation was adequate. Findings:      The terminal ileum appeared normal. Biopsies were taken with a cold        forceps for histology.      Four sessile polyps were found in the descending colon and ascending       colon. The polyps were 2 to 5 mm in size. These polyps were removed with       a cold snare. Resection and retrieval were complete.      The retroflexed view of the distal rectum and anal verge was normal and       showed no anal or rectal abnormalities.      Normal mucosa was found in the entire colon. Impression:           - The examined portion of the ileum was normal.                        Biopsied.                       - Four 2 to 5 mm polyps in the descending colon and in                        the ascending colon, removed with a cold snare.                        Resected and retrieved.                       - The distal rectum and anal verge are normal on                        retroflexion view.                       - Normal mucosa in the entire examined colon. Recommendation:       - Discharge patient to home (with escort).                       - Resume previous diet today.                       -  Continue present medications.                       - Await pathology results.                       - Repeat colonoscopy in 5 years for surveillance of                        multiple polyps.                       - Return to my office as previously scheduled. Procedure Code(s):    --- Professional ---                       [phone], Colonoscopy, flexible; with removal of tumor(s),                        polyp(s), or other lesion(s) by snare technique                       45380, 59, Colonoscopy, flexible; with biopsy, single                        or multiple Diagnosis Code(s):    --- Professional ---                       K63.5, Polyp of colon                       R10.31, Right lower quadrant pain                       R93.3, Abnormal findings on diagnostic imaging of other                        parts of digestive tract CPT copyright 2019 American Medical Association.  All rights reserved. The codes documented in this report are preliminary and upon coder review may  be revised to meet current compliance requirements. Dr. Ulyess Mort [name] MD, MD 11/11/2018 11:07:54 AM This report has been signed electronically. Number of Addenda: 0 Note Initiated On: 11/11/2018 10:13 AM Scope Withdrawal Time: 0 hours 24 minutes 51 seconds  Total Procedure Duration: 0 hours 35 minutes 11 seconds  Estimated Blood Loss: Estimated blood loss: none.      [facility]

## 2018-11-11 NOTE — Transfer of Care (Signed)
Immediate Anesthesia Transfer of Care Note  Patient: Erin Petersen  Procedure(s) Performed: COLONOSCOPY WITH PROPOFOL (N/A )  Patient Location: PACU  Anesthesia Type:General  Level of Consciousness: sedated  Airway & Oxygen Therapy: Patient Spontanous Breathing and Patient connected to nasal cannula oxygen  Post-op Assessment: Report given to RN and Post -op Vital signs reviewed and stable  Post vital signs: Reviewed and stable  Last Vitals:  Vitals Value Taken Time  BP 110/60 11/11/18 1111  Temp    Pulse 67 11/11/18 1111  Resp 17 11/11/18 1111  SpO2 99 % 11/11/18 1111  Vitals shown include unvalidated device data.  Last Pain:  Vitals:   11/11/18 0941  TempSrc: Tympanic  PainSc: 2       Patients Stated Pain Goal: 0 (58/06/38 6854)  Complications: No apparent anesthesia complications

## 2018-11-12 ENCOUNTER — Encounter: Payer: Self-pay | Admitting: Gastroenterology

## 2018-11-12 LAB — SURGICAL PATHOLOGY

## 2018-11-18 ENCOUNTER — Ambulatory Visit (INDEPENDENT_AMBULATORY_CARE_PROVIDER_SITE_OTHER): Payer: Medicare Other | Admitting: Urology

## 2018-11-18 ENCOUNTER — Encounter: Payer: Self-pay | Admitting: Urology

## 2018-11-18 ENCOUNTER — Other Ambulatory Visit: Payer: Self-pay

## 2018-11-18 VITALS — BP 137/72 | HR 73 | Ht <= 58 in | Wt 158.0 lb

## 2018-11-18 DIAGNOSIS — R300 Dysuria: Secondary | ICD-10-CM | POA: Diagnosis not present

## 2018-11-18 DIAGNOSIS — N3020 Other chronic cystitis without hematuria: Secondary | ICD-10-CM

## 2018-11-18 LAB — MICROSCOPIC EXAMINATION

## 2018-11-18 LAB — URINALYSIS, COMPLETE
Bilirubin, UA: NEGATIVE
Glucose, UA: NEGATIVE
Ketones, UA: NEGATIVE
Nitrite, UA: POSITIVE — AB
Protein,UA: NEGATIVE
Specific Gravity, UA: 1.02 (ref 1.005–1.030)
Urobilinogen, Ur: 0.2 mg/dL (ref 0.2–1.0)
pH, UA: 5 (ref 5.0–7.5)

## 2018-11-18 NOTE — Progress Notes (Signed)
11/18/2018 10:23 AM   Fredda Hammed 1940-04-11 [id number]  Referring provider: Elby Beck, [place],  [place] [address]  Chief Complaint  Patient presents with  . Cystitis    HPI: I was consulted to assess the patient's suprapubic pain.  3 or 4 times a night she wakes up with suprapubic discomfort that is quite severe.  It is relieved by voiding.  If she goes frequently during the day she does not have the pain unless she tries to hold it.  She voids every 2 hour in somewhat of a time voiding fashion gets up to 3-4 times a night.  She rarely at night if she holds it has foot on the floor syndrome is continent during the day  [place] and she said that hydroxyzine helped minimally and she failed amitriptyline and is also been given local estrogen cream.  She thinks she gets 3 or 4 bladder infections a year with frequency urethral dysuria that respond favorably antibiotic and has been worked up for microhematuria.  She [place] CT [place] 02, 2018 demonstrating a right renal cyst and had a recent normal colonoscopy for possible enteritis.  Patient has never been on urinary prophylaxis.  He takes gabapentin for neuropathy and Mobic for arthritis.  She is prone to constipation.  She has had a hysterectomy  No history of previous GU surgery.  No kidney stones  Modifying factors: There are no other modifying factors  Associated signs and symptoms: There are no other associated signs and symptoms Aggravating and relieving factors: There are no other aggravating or relieving factors Severity: Moderate Duration: Persistent   PMH: Past Medical History:  Diagnosis Date  . Arthritis   . Colon polyps   . GERD (gastroesophageal reflux disease)   . Hyperlipidemia   . Hypertension   . Prediabetes   . RLS (restless legs syndrome)   . SOB (shortness of breath)   . Urinary incontinence   . Urinary tract infection      Surgical History: Past Surgical History:  Procedure Laterality Date  . ABDOMINAL HYSTERECTOMY  1972  . APPENDECTOMY  1958  . BACK SURGERY    . BILATERAL SALPINGOOPHORECTOMY  01/07/2018  . COLONOSCOPY WITH PROPOFOL N/A 11/11/2018   Procedure: COLONOSCOPY WITH PROPOFOL;  Surgeon: [name], MD;  Location: [facility] ENDOSCOPY;  Service: Gastroenterology;  Laterality: N/A;  . [place] (x1), 2018(x3)  . SPINAL FUSION  2002  . TONSILLECTOMY AND ADENOIDECTOMY  1961    Home Medications:  Allergies as of 11/18/2018      Reactions   Bee Pollen Hives   Ciprofloxacin Hcl    Sulfa Antibiotics Rash   Bactrim Bactrim, Cipro      Medication List       Accurate as of November 18, 2018 10:23 AM. If you have any questions, ask your nurse or doctor.        STOP taking these medications   predniSONE 10 MG tablet Commonly known as: DELTASONE Stopped by: Reece Packer, MD     TAKE these medications   albuterol 108 (90 Base) MCG/ACT inhaler Commonly known as: VENTOLIN HFA Inhale 2 puffs into the lungs every 4 (four) hours as needed for wheezing or shortness of breath (cough, shortness of breath or wheezing.).   amitriptyline 25 MG tablet Commonly known as: ELAVIL Take 1 tablet (25 mg total) by mouth at bedtime.  amLODipine 5 MG tablet Commonly known as: NORVASC Take 1 tablet (5 mg total) by mouth daily.   aspirin 81 MG chewable tablet Chew 81 mg DAILY.   Cholecalciferol 50 MCG (2000 UT) Tabs Take by mouth.   Dexilant 60 MG capsule Generic drug: dexlansoprazole Take 1 capsule (60 mg total) by mouth daily.   diclofenac sodium 1 % Gel Commonly known as: VOLTAREN Apply 1 application topically 2 (two) times daily as needed.   dicyclomine 10 MG capsule Commonly known as: Bentyl Take 1 capsule (10 mg total) by mouth at bedtime.   EPINEPHrine 0.3 mg/0.3 mL Soaj injection Commonly known as: EPI-PEN Inject into the muscle.   estradiol 0.1 MG/GM vaginal  cream Commonly known as: ESTRACE Place 1 Applicatorful vaginally 3 (three) times a week.   Fusion Plus Caps Take 1 capsule by mouth daily with breakfast.   gabapentin 300 MG capsule Commonly known as: NEURONTIN Take 3-4 capsules (900-1,200 mg total) by mouth 2 (two) times daily. Take 3 tablets in am, 4 tablets pm   hydrochlorothiazide 25 MG tablet Commonly known as: HYDRODIURIL Take 1 tablet (25 mg total) by mouth daily.   hydrOXYzine 25 MG tablet Commonly known as: ATARAX/VISTARIL Take 1 tablet (25 mg total) by mouth 2 (two) times daily.   loratadine 10 MG tablet Commonly known as: CLARITIN Take by mouth.   Melatonin Maximum Strength 5 MG Tabs Generic drug: Melatonin Take by mouth.   meloxicam 15 MG tablet Commonly known as: MOBIC Take 1 tablet (15 mg total) by mouth daily as needed.   polyethylene glycol 17 g packet Commonly known as: MIRALAX / GLYCOLAX Take 1 packet by mouth daily as needed.   rOPINIRole 0.5 MG tablet Commonly known as: REQUIP Take 1-3 tablets (0.5-1.5 mg total) by mouth 3 (three) times daily. Take 1 tablet am, 1 pm and 3 tablets at bedtime   simvastatin 10 MG tablet Commonly known as: ZOCOR Take 1 tablet (10 mg total) by mouth daily before supper.   vitamin B-12 1000 MCG tablet Commonly known as: CYANOCOBALAMIN Take by mouth.       Allergies:  Allergies  Allergen Reactions  . Bee Pollen Hives  . Ciprofloxacin Hcl   . Sulfa Antibiotics Rash    Bactrim Bactrim, Cipro     Family History: Family History  Problem Relation Age of Onset  . Alcohol abuse Mother   . Cancer Mother   . Stroke Mother   . Alcohol abuse Father   . Arthritis Sister   . Arthritis Daughter   . COPD Daughter   . Arthritis Paternal Grandmother   . Asthma Paternal Grandmother   . COPD Paternal Grandmother     Social History:  reports that she has never smoked. She has never used smokeless tobacco. She reports current alcohol use. She reports that she does  not use drugs.  ROS: UROLOGY Frequent Urination?: Yes Hard to postpone urination?: No Burning/pain with urination?: Yes Leakage of urine?: No Urine stream starts and stops?: No Trouble starting stream?: No Do you have to strain to urinate?: No Blood in urine?: Yes Urinary tract infection?: Yes Sexually transmitted disease?: No Injury to kidneys or bladder?: No Painful intercourse?: No Weak stream?: No Currently pregnant?: No Vaginal bleeding?: No Last menstrual period?: n  Gastrointestinal Nausea?: No Vomiting?: No Indigestion/heartburn?: No Diarrhea?: No Constipation?: Yes  Constitutional Fever: No Night sweats?: No Weight loss?: No Fatigue?: No  Skin Skin rash/lesions?: No Itching?: No  Eyes Blurred vision?: No Double vision?: No  Ears/Nose/Throat Sore throat?: No Sinus problems?: No  Hematologic/Lymphatic Swollen glands?: No Easy bruising?: No  Cardiovascular Leg swelling?: No Chest pain?: No  Respiratory Cough?: No Shortness of breath?: No  Endocrine Excessive thirst?: No  Musculoskeletal Back pain?: Yes Joint pain?: Yes  Neurological Headaches?: No Dizziness?: No  Psychologic Depression?: No Anxiety?: No  Physical Exam: BP 137/72   Pulse 73   Ht 4' 10"  (1.473 m)   Wt 71.7 kg   LMP  (LMP Unknown)   BMI 33.02 kg/m   Constitutional:  Alert and oriented, No acute distress. HEENT: [place] AT, moist mucus membranes.  Trachea midline, no masses. Cardiovascular: No clubbing, cyanosis, or edema. Respiratory: Normal respiratory effort, no increased work of breathing. GI: Abdomen is soft, nontender, nondistended, no abdominal masses GU: High grade 1 cystocele with little to no rectocele and no stress incontinence.  Well supported bladder neck.  Tissues looked atrophied.  Bladder and introitus was minimally tender Skin: No rashes, bruises or suspicious lesions. Lymph: No cervical or inguinal adenopathy. Neurologic: Grossly intact, no focal  deficits, moving all 4 extremities. Psychiatric: Normal mood and affect.  Laboratory Data: No results found for: WBC, HGB, HCT, MCV, PLT  No results found for: CREATININE  No results found for: PSA  No results found for: TESTOSTERONE  No results found for: HGBA1C  Urinalysis No results found for: COLORURINE, APPEARANCEUR, LABSPEC, [place], GLUCOSEU, HGBUR, BILIRUBINUR, KETONESUR, PROTEINUR, UROBILINOGEN, NITRITE, LEUKOCYTESUR  Pertinent Imaging:   Assessment & Plan: Patient may have interstitial cystitis and likely does.  The role of a hydrodistention discussed.  Patient had been given a diagnosis of interstitial cystitis I do state and describes a normal cystoscopy.  Work-up for hematuria normal.  Has never had a hydrodistention.  Usual template utilized.  Rescue treatments she was aware of but I would like to do the hydrodistention first for diagnosis and hopefully for treatment and to rule out ulcer.  We will call if urine culture is positive  1. Dysuria  - CULTURE, URINE COMPREHENSIVE - Urinalysis, Complete   No follow-ups on file.  Reece Packer, MD  [facility] [address] [place], [place] [address] [phone]

## 2018-11-21 LAB — CULTURE, URINE COMPREHENSIVE

## 2018-11-25 ENCOUNTER — Telehealth: Payer: Self-pay | Admitting: Family Medicine

## 2018-11-25 MED ORDER — NITROFURANTOIN MACROCRYSTAL 100 MG PO CAPS: 100.0000 mg | ORAL_CAPSULE | Freq: Two times a day (BID) | ORAL | 0 refills | Status: DC

## 2018-11-25 NOTE — Telephone Encounter (Signed)
Patient notified and voiced understanding. RX sent to pharmacy.

## 2018-11-25 NOTE — Telephone Encounter (Signed)
-----   Message from Bjorn Loser, MD sent at 11/22/2018 12:27 PM EDT -----  Macrodantin 100 mg twice a day for 1 week     ----- Message ----- From: Kyra Manges, CMA Sent: 11/20/2018  11:28 AM EDT To: Bjorn Loser, MD   ----- Message ----- From: Interface, [facility] Lab Results In Sent: 11/18/2018   4:36 PM EDT To: [name] Clinical

## 2018-11-27 ENCOUNTER — Encounter: Payer: Self-pay | Admitting: Family Medicine

## 2018-11-28 DIAGNOSIS — G629 Polyneuropathy, unspecified: Secondary | ICD-10-CM | POA: Insufficient documentation

## 2018-11-28 DIAGNOSIS — G2581 Restless legs syndrome: Secondary | ICD-10-CM | POA: Insufficient documentation

## 2018-11-29 ENCOUNTER — Other Ambulatory Visit: Payer: Self-pay | Admitting: Neurology

## 2018-11-29 DIAGNOSIS — R413 Other amnesia: Secondary | ICD-10-CM

## 2018-12-02 ENCOUNTER — Other Ambulatory Visit: Payer: Self-pay | Admitting: Family Medicine

## 2018-12-02 DIAGNOSIS — E2839 Other primary ovarian failure: Secondary | ICD-10-CM

## 2018-12-03 ENCOUNTER — Encounter: Payer: Self-pay | Admitting: Family Medicine

## 2018-12-03 ENCOUNTER — Other Ambulatory Visit: Payer: Self-pay

## 2018-12-03 ENCOUNTER — Encounter: Payer: Self-pay | Admitting: Gastroenterology

## 2018-12-03 ENCOUNTER — Ambulatory Visit (INDEPENDENT_AMBULATORY_CARE_PROVIDER_SITE_OTHER): Payer: Medicare Other | Admitting: Gastroenterology

## 2018-12-03 ENCOUNTER — Ambulatory Visit (INDEPENDENT_AMBULATORY_CARE_PROVIDER_SITE_OTHER): Payer: Medicare Other | Admitting: Family Medicine

## 2018-12-03 ENCOUNTER — Ambulatory Visit: Payer: Medicare Other | Admitting: Gastroenterology

## 2018-12-03 ENCOUNTER — Ambulatory Visit (INDEPENDENT_AMBULATORY_CARE_PROVIDER_SITE_OTHER): Payer: Medicare Other

## 2018-12-03 VITALS — BP 144/78 | HR 70 | Ht <= 58 in

## 2018-12-03 VITALS — BP 162/82 | HR 60 | Temp 98.1°F | Ht <= 58 in | Wt 157.4 lb

## 2018-12-03 DIAGNOSIS — G8929 Other chronic pain: Secondary | ICD-10-CM

## 2018-12-03 DIAGNOSIS — D508 Other iron deficiency anemias: Secondary | ICD-10-CM

## 2018-12-03 DIAGNOSIS — I10 Essential (primary) hypertension: Secondary | ICD-10-CM

## 2018-12-03 DIAGNOSIS — K219 Gastro-esophageal reflux disease without esophagitis: Secondary | ICD-10-CM

## 2018-12-03 DIAGNOSIS — R0789 Other chest pain: Secondary | ICD-10-CM

## 2018-12-03 DIAGNOSIS — T50B95A Adverse effect of other viral vaccines, initial encounter: Secondary | ICD-10-CM

## 2018-12-03 DIAGNOSIS — R1031 Right lower quadrant pain: Secondary | ICD-10-CM

## 2018-12-03 DIAGNOSIS — Z23 Encounter for immunization: Secondary | ICD-10-CM | POA: Diagnosis not present

## 2018-12-03 DIAGNOSIS — E559 Vitamin D deficiency, unspecified: Secondary | ICD-10-CM | POA: Insufficient documentation

## 2018-12-03 MED ORDER — DICYCLOMINE HCL 10 MG PO CAPS: 10.0000 mg | ORAL_CAPSULE | Freq: Every day | ORAL | 0 refills | Status: DC

## 2018-12-03 MED ORDER — DEXILANT 30 MG PO CPDR: 30.0000 mg | DELAYED_RELEASE_CAPSULE | Freq: Every day | ORAL | 0 refills | Status: DC

## 2018-12-03 NOTE — Progress Notes (Signed)
Erin Darby, MD [address]  [place]  [place], [place] [address]  Main: [phone]  Fax: [phone]    Gastroenterology Consultation  Referring Provider:     Elby Beck, FNP Primary Care Physician:  Elby Beck, FNP Primary Gastroenterologist:  Dr. Cephas Petersen Reason for Consultation:     Right lower quadrant pain, abnormal CT ileum        HPI:   Erin Petersen is a 78 y.o. female referred by Dr. Carlean Purl, Dalbert Batman, FNP  for consultation & management of right lower quadrant pain.  Patient reports having approximately 4 years history of right lower quadrant pain that has progressed over years.  She recently moved from [place] to live with her daughter in 08/2018.  Over the last 4 years, she was evaluated by general surgery and was found to have inguinal hernias which were thought to have caused the pain, underwent hernia repair.  The pain persisted, later evaluated by OB/GYN for ovarian cyst and underwent bilateral oophorectomy.  The pain still persisted.  She then saw urology for possible bladder etiology as she was having urinary urgency nocturnally.  She underwent CT abdomen and pelvis in [place] and was found to have thickening of the terminal ileum, mesenteric edema as well as fluid in the cul-de-sac.  Therefore, she is referred to GI for further evaluation and rule out Crohn's.  Patient reports that the pain has become progressive in last 1 year, worse lying on right side, wakes her up from sleep.  Patient has been taking Advil at night in addition to meloxicam.  She also describes fatigue, large joint arthralgias.  She never smoked in her life.  Does not drink alcohol.  The severity of pain has significantly impacted her physical activity Patient denies having exposure to TB.  She started on amitriptyline, meloxicam and hydroxyzine for her lower abdominal pain.  Patient also reports localized patchy area of burning and tingling in her left  lateral upper thigh  Follow-up visit in 12/03/2018  Patient underwent colonoscopy to evaluate for abnormal CT of the terminal ileum.  It was unremarkable including TI biopsies.  There was no evidence of inflammatory bowel disease.  I empirically started her on Bentyl at bedtime which has significantly improved right lower quadrant discomfort.  She does not wake up during sleep due to severe pain.  She has chronic cystitis for which she is currently being followed by urology.  Patient reports that she has been taking Dexilant 60 mg for chronic GERD.  She does report regurgitation of food, known small hiatal hernia.  When she tried to stop Dexilant, she developed severe reflux symptoms in 3 days after stopping the medication. She used to take Nexium for several years which stopped working.  Therefore, switched to [place].  She reports having an upper endoscopy and possibly a pH test about 20 years ago.  She denies dysphagia.  NSAIDs: Advil and meloxicam daily  Antiplts/Anticoagulants/Anti thrombotics: None  GI Procedures: Colonoscopy in 2018, reportedly normal  Colonoscopy 11/11/2018 - The examined portion of the ileum was normal. Biopsied. - Four 2 to 5 mm polyps in the descending colon and in the ascending colon, removed with a cold snare. Resected and retrieved. - The distal rectum and anal verge are normal on retroflexion view. - Normal mucosa in the entire examined colon.  DIAGNOSIS:  A. TERMINAL ILEUM; COLD BIOPSY:  - ILEAL MUCOSA WITH PROMINENT INTRAMUCOSAL LYMPHOID AGGREGATES.  - NEGATIVE FOR ACTIVE INFLAMMATION, DYSPLASIA, AND  MALIGNANCY.   B. COLON POLYP X2, ASCENDING; COLD SNARE:  - TUBULAR ADENOMA, TWO FRAGMENTS.  - NEGATIVE FOR HIGH-GRADE DYSPLASIA AND MALIGNANCY.   C. COLON POLYP X2, DESCENDING; COLD SNARE:  - TUBULAR ADENOMA, ONE FRAGMENT.  - POLYPOID BENIGN COLONIC MUCOSA, ONE FRAGMENT.  - NEGATIVE FOR HIGH-GRADE DYSPLASIA AND MALIGNANCY.  Past Medical History:   Diagnosis Date  . Arthritis   . Colon polyps   . GERD (gastroesophageal reflux disease)   . Hyperlipidemia   . Hypertension   . Prediabetes   . RLS (restless legs syndrome)   . SOB (shortness of breath)   . Urinary incontinence   . Urinary tract infection     Past Surgical History:  Procedure Laterality Date  . ABDOMINAL HYSTERECTOMY  1972  . APPENDECTOMY  1958  . BACK SURGERY    . BILATERAL SALPINGOOPHORECTOMY  01/07/2018  . COLONOSCOPY WITH PROPOFOL N/A 11/11/2018   Procedure: COLONOSCOPY WITH PROPOFOL;  Surgeon: [name], MD;  Location: [facility]COPY;  Service: Gastroenterology;  Laterality: N/A;  . Bandera (x1), 2018(x3)  . SPINAL FUSION  2002  . TONSILLECTOMY AND ADENOIDECTOMY  1961    Current Outpatient Medications:  .  albuterol (VENTOLIN HFA) 108 (90 Base) MCG/ACT inhaler, Inhale 2 puffs into the lungs every 4 (four) hours as needed for wheezing or shortness of breath (cough, shortness of breath or wheezing.)., Disp: 16 g, Rfl: 1 .  amitriptyline (ELAVIL) 25 MG tablet, Take 1 tablet (25 mg total) by mouth at bedtime., Disp: 90 tablet, Rfl: 3 .  amLODipine (NORVASC) 5 MG tablet, Take 1 tablet (5 mg total) by mouth daily., Disp: 90 tablet, Rfl: 3 .  aspirin 81 MG chewable tablet, Chew 81 mg DAILY., Disp: , Rfl:  .  Cholecalciferol 50 MCG (2000 UT) TABS, Take by mouth., Disp: , Rfl:  .  DEXILANT 60 MG capsule, Take 1 capsule (60 mg total) by mouth daily., Disp: 90 capsule, Rfl: 1 .  diclofenac sodium (VOLTAREN) 1 % GEL, Apply 1 application topically 2 (two) times daily as needed., Disp: , Rfl:  .  dicyclomine (BENTYL) 10 MG capsule, Take 1 capsule (10 mg total) by mouth at bedtime., Disp: 90 capsule, Rfl: 0 .  EPINEPHrine 0.3 mg/0.3 mL IJ SOAJ injection, Inject into the muscle., Disp: , Rfl:  .  estradiol (ESTRACE) 0.1 MG/GM vaginal cream, Place 1 Applicatorful vaginally 3 (three) times a week., Disp: 42.5 g, Rfl: 3 .  gabapentin (NEURONTIN) 300  MG capsule, Take 3-4 capsules (900-1,200 mg total) by mouth 2 (two) times daily. Take 3 tablets in am, 4 tablets pm, Disp: 630 capsule, Rfl: 3 .  hydrochlorothiazide (HYDRODIURIL) 25 MG tablet, Take 1 tablet (25 mg total) by mouth daily., Disp: 90 tablet, Rfl: 1 .  hydrOXYzine (ATARAX/VISTARIL) 25 MG tablet, Take 1 tablet (25 mg total) by mouth 2 (two) times daily., Disp: 180 tablet, Rfl: 3 .  loratadine (CLARITIN) 10 MG tablet, Take by mouth., Disp: , Rfl:  .  Melatonin (MELATONIN MAXIMUM STRENGTH) 5 MG TABS, Take by mouth., Disp: , Rfl:  .  meloxicam (MOBIC) 15 MG tablet, Take 1 tablet (15 mg total) by mouth daily as needed., Disp: 90 tablet, Rfl: 3 .  nitrofurantoin (MACRODANTIN) 100 MG capsule, Take 1 capsule (100 mg total) by mouth 2 (two) times daily., Disp: 14 capsule, Rfl: 0 .  polyethylene glycol (MIRALAX / GLYCOLAX) 17 g packet, Take 1 packet by mouth daily as needed., Disp: , Rfl:  .  pregabalin (LYRICA) 100 MG capsule, Take by mouth., Disp: , Rfl:  .  rOPINIRole (REQUIP) 0.5 MG tablet, Take 1-3 tablets (0.5-1.5 mg total) by mouth 3 (three) times daily. Take 1 tablet am, 1 pm and 3 tablets at bedtime, Disp: 450 tablet, Rfl: 3 .  simvastatin (ZOCOR) 10 MG tablet, Take 1 tablet (10 mg total) by mouth daily before supper., Disp: 90 tablet, Rfl: 3 .  vitamin B-12 (CYANOCOBALAMIN) 1000 MCG tablet, Take by mouth., Disp: , Rfl:  .  Dexlansoprazole (DEXILANT) 30 MG capsule, Take 1 capsule (30 mg total) by mouth daily., Disp: 30 capsule, Rfl: 0 .  sulfamethoxazole-trimethoprim (BACTRIM) 400-80 MG tablet, Take by mouth., Disp: , Rfl:    Family History  Problem Relation Age of Onset  . Alcohol abuse Mother   . Cancer Mother   . Stroke Mother   . Alcohol abuse Father   . Arthritis Sister   . Arthritis Daughter   . COPD Daughter   . Arthritis Paternal Grandmother   . Asthma Paternal Grandmother   . COPD Paternal Grandmother      Social History   Tobacco Use  . Smoking status: Never  Smoker  . Smokeless tobacco: Never Used  Substance Use Topics  . Alcohol use: Yes    Comment: rare  . Drug use: Never    Allergies as of 12/03/2018 - Review Complete 12/03/2018  Allergen Reaction Noted  . Other Anaphylaxis 12/03/2018  . Ciprofloxacin hcl  12/29/2016  . Sulfa antibiotics Rash 06/10/2012    Review of Systems:    All systems reviewed and negative except where noted in HPI.   Physical Exam:  BP (!) 162/82   Pulse 60   Temp 98.1 F (36.7 C) (Oral)   Ht 4' 10"  (1.473 m)   Wt 157 lb 6.4 oz (71.4 kg)   LMP  (LMP Unknown)   BMI 32.90 kg/m  No LMP recorded (lmp unknown). Patient is postmenopausal.  General:   Alert,  Well-developed, well-nourished, pleasant and cooperative in NAD Head:  Normocephalic and atraumatic. Eyes:  Sclera clear, no icterus.   Conjunctiva pink. Ears:  Normal auditory acuity. Nose:  No deformity, discharge, or lesions. Mouth:  No deformity or lesions,oropharynx pink & moist. Neck:  Supple; no masses or thyromegaly. Lungs:  Respirations even and unlabored.  Clear throughout to auscultation.   No wheezes, crackles, or rhonchi. No acute distress. Heart:  Regular rate and rhythm; no murmurs, clicks, rubs, or gallops. Abdomen:  Normal bowel sounds. Soft, mild tenderness in the suprapubic and right lower quadrant and non-distended without masses, hepatosplenomegaly or hernias noted.  No guarding or rebound tenderness.   Rectal: Not performed Msk:  Symmetrical without gross deformities. Good, equal movement & strength bilaterally. Pulses:  Normal pulses noted. Extremities:  No clubbing or edema.  No cyanosis. Neurologic:  Alert and oriented x3;  grossly normal neurologically. Skin:  Intact without significant lesions or rashes. No jaundice. Psych:  Alert and cooperative. Normal mood and affect.  Imaging Studies: Reviewed  Assessment and Plan:   Liliyana Thobe is a 78 y.o. female with history of metabolic syndrome, chronic right lower  quadrant pain, history of hernia repair, bilateral oophorectomy seen for follow-up of chronic right lower quadrant pain, CT abdomen concerning for ileitis.  Colonoscopy with TI evaluation was unremarkable including biopsies.  Chronic right lower quadrant pain Work-up unremarkable, likely functional pain Continue Bentyl 10 mg at bedtime which is significantly helping  Severe iron deficiency, microcytosis without anemia contributing to chronic  fatigue and restless legs Took fusion plus for 1 month Recommend to continue for 1 more month Recheck iron studies during next follow-up  Chronic GERD, long-term PPI use Advised to decrease Dexilant to 30 mg daily, monitor symptoms If patient is not able to wean off PPI, recommend EGD for further evaluation   Follow up in 4 weeks   Erin Darby, MD

## 2018-12-03 NOTE — Patient Instructions (Addendum)
I think combination of high dose flu shot and recent cross taper from gabapentin to lyrica contributed to today's symptoms. EKG looking ok today.  I'm glad you're feeling better.  We should probably avoid high dose flu shots in the future.

## 2018-12-03 NOTE — Assessment & Plan Note (Signed)
BP elevated today - she will start monitoring BP more closely at home and let us know if consistently elevated.

## 2018-12-03 NOTE — Progress Notes (Signed)
Patient given HD flu vaccine at 11:00, left deltoid IM. Instantly began having sx's of possible reaction. Pt c.o of metallic taste in mouth, chest tightness/discomfort, dizziness, elevated BP and headache(base of skull). Patient given some water, sx's relieved a little by drinking.  Pt vitals taken,  BP 158/76 L HR 70 O2 98%  Spoke with Dr Darnell Level regarding sx's and he requested she be added on to his schedule to be evaluated vs calling EMS right away. Pt brought into office in a wheelchair d/t dizziness, placed in Dr Synthia Innocent exam room. Spoke with Dr Darnell Level and Lattie Haw to give handoff.   Varney Daily, [place] 12/03/18

## 2018-12-03 NOTE — Assessment & Plan Note (Signed)
Anticipate combination of high dose flu vaccine and current cross taper gabapentin -> lyrica. Did suggest she avoid high dose flu shots in the future. EKG reassuring today. She is feeling better after resting in office 20-30 minutes. Red flags to seek urgent or ER care reviewed including recurrent or worsening dyspnea, chest tightness, dizziness. Pt agrees with plan.

## 2018-12-03 NOTE — Progress Notes (Signed)
This visit was conducted in person.  BP (!) 144/78 (BP Location: Right Arm, Cuff Size: Normal)   Pulse 70   Ht 4' 10"  (1.473 m)   LMP  (LMP Unknown)   SpO2 98%   BMI 33.02 kg/m   BP Readings from Last 3 Encounters:  12/03/18 (!) 162/82  12/03/18 (!) 144/78  11/18/18 137/72    CC: ?flu shot reaction Subjective:    Patient ID: Erin Petersen, female    DOB: 1940-11-07, 78 y.o.   MRN: [id number]  HPI: Erin Petersen is a 79 y.o. female presenting on 12/03/2018 for Allergic Reaction (Pt recieved HD flu shot at office today.  Now c/o bad HA at base of head, dizziness, metallic taste and chest pain. )   Work in for acute reaction after receiving high dose flu shot. Right when she received flu shot she started experiencing metallic taste in mouth, chest tightness and mild dyspnea, lightheadedness with blurry vision, headache at base of skull. Some nausea now with dry mouth.   She already drank a bottle of water.  Last year she did have high dose flu shot and tolerated it fine.  She is currently cross tapering from gabapentin to lyrica (started last week). This morning she decreased from 4 gabapentin 366m to 3, she is currently taking lyrica 596mBID (SManuella Ghazi   H/o bee allergy.  She had colonoscopy last month.   She did take her amlodipine and hctz this morning.      Relevant past medical, surgical, family and social history reviewed and updated as indicated. Interim medical history since our last visit reviewed. Allergies and medications reviewed and updated. Outpatient Medications Prior to Visit  Medication Sig Dispense Refill  . albuterol (VENTOLIN HFA) 108 (90 Base) MCG/ACT inhaler Inhale 2 puffs into the lungs every 4 (four) hours as needed for wheezing or shortness of breath (cough, shortness of breath or wheezing.). 16 g 1  . amitriptyline (ELAVIL) 25 MG tablet Take 1 tablet (25 mg total) by mouth at bedtime. 90 tablet 3  . amLODipine (NORVASC) 5 MG tablet Take 1 tablet (5 mg  total) by mouth daily. 90 tablet 3  . aspirin 81 MG chewable tablet Chew 81 mg DAILY.    . Marland Kitchenholecalciferol 50 MCG (2000 UT) TABS Take by mouth.    . DEXILANT 60 MG capsule Take 1 capsule (60 mg total) by mouth daily. 90 capsule 1  . diclofenac sodium (VOLTAREN) 1 % GEL Apply 1 application topically 2 (two) times daily as needed.    . dicyclomine (BENTYL) 10 MG capsule Take 1 capsule (10 mg total) by mouth at bedtime. 30 capsule 0  . EPINEPHrine 0.3 mg/0.3 mL IJ SOAJ injection Inject into the muscle.    . estradiol (ESTRACE) 0.1 MG/GM vaginal cream Place 1 Applicatorful vaginally 3 (three) times a week. 42.5 g 3  . gabapentin (NEURONTIN) 300 MG capsule Take 3-4 capsules (900-1,200 mg total) by mouth 2 (two) times daily. Take 3 tablets in am, 4 tablets pm 630 capsule 3  . hydrochlorothiazide (HYDRODIURIL) 25 MG tablet Take 1 tablet (25 mg total) by mouth daily. 90 tablet 1  . hydrOXYzine (ATARAX/VISTARIL) 25 MG tablet Take 1 tablet (25 mg total) by mouth 2 (two) times daily. 180 tablet 3  . loratadine (CLARITIN) 10 MG tablet Take by mouth.    . Melatonin (MELATONIN MAXIMUM STRENGTH) 5 MG TABS Take by mouth.    . meloxicam (MOBIC) 15 MG tablet Take 1 tablet (15 mg total) by  mouth daily as needed. 90 tablet 3  . nitrofurantoin (MACRODANTIN) 100 MG capsule Take 1 capsule (100 mg total) by mouth 2 (two) times daily. 14 capsule 0  . polyethylene glycol (MIRALAX / GLYCOLAX) 17 g packet Take 1 packet by mouth daily as needed.    Marland Kitchen rOPINIRole (REQUIP) 0.5 MG tablet Take 1-3 tablets (0.5-1.5 mg total) by mouth 3 (three) times daily. Take 1 tablet am, 1 pm and 3 tablets at bedtime 450 tablet 3  . simvastatin (ZOCOR) 10 MG tablet Take 1 tablet (10 mg total) by mouth daily before supper. 90 tablet 3  . vitamin B-12 (CYANOCOBALAMIN) 1000 MCG tablet Take by mouth.     No facility-administered medications prior to visit.      Per HPI unless specifically indicated in ROS section below Review of Systems  Objective:    BP (!) 144/78 (BP Location: Right Arm, Cuff Size: Normal)   Pulse 70   Ht 4' 10"  (1.473 m)   LMP  (LMP Unknown)   SpO2 98%   BMI 33.02 kg/m   Wt Readings from Last 3 Encounters:  12/03/18 157 lb 6.4 oz (71.4 kg)  11/18/18 158 lb (71.7 kg)  11/11/18 155 lb (70.3 kg)    Physical Exam Vitals signs and nursing note reviewed.  Constitutional:      General: She is not in acute distress.    Appearance: Normal appearance. She is not ill-appearing.  HENT:     Mouth/Throat:     Mouth: Mucous membranes are moist.     Pharynx: Oropharynx is clear. No posterior oropharyngeal erythema.  Eyes:     Extraocular Movements: Extraocular movements intact.     Conjunctiva/sclera: Conjunctivae normal.     Pupils: Pupils are equal, round, and reactive to light.  Neck:     Musculoskeletal: Normal range of motion and neck supple.     Vascular: No carotid bruit.  Cardiovascular:     Rate and Rhythm: Normal rate and regular rhythm.     Pulses: Normal pulses.     Heart sounds: Normal heart sounds. No murmur.  Pulmonary:     Effort: Pulmonary effort is normal. No respiratory distress.     Breath sounds: Normal breath sounds. No wheezing, rhonchi or rales.  Musculoskeletal:     Right lower leg: No edema.     Left lower leg: No edema.  Lymphadenopathy:     Cervical: No cervical adenopathy.  Skin:    General: Skin is warm and dry.     Findings: No rash.  Neurological:     General: No focal deficit present.     Mental Status: She is alert and oriented to person, place, and time.     Cranial Nerves: Cranial nerves are intact.     Sensory: Sensation is intact.     Motor: Motor function is intact.     Coordination: Coordination is intact.     Comments:  CN 2-12 intact EOMI FTN intact   Psychiatric:        Mood and Affect: Mood normal.        Behavior: Behavior normal.       EKG - NSR rate 60, mild LAD, normal intervals, no acute ST/T changes, no old to compare.  Assessment &  Plan:  After 10-15 min, she started feeling better, symptoms largely resolved prior to leaving office. She was able to drink 1 small bottle of water and then another glass of water and tolerated well. She did have 1pm appt  with GI.  Problem List Items Addressed This Visit    Benign essential hypertension    BP elevated today - she will start monitoring BP more closely at home and let us know if consistently elevated.       Adverse reaction to influenza vaccine, initial encounter - Primary    Anticipate combination of high dose flu vaccine and current cross taper gabapentin -> lyrica. Did suggest she avoid high dose flu shots in the future. EKG reassuring today. She is feeling better after resting in office 20-30 minutes. Red flags to seek urgent or ER care reviewed including recurrent or worsening dyspnea, chest tightness, dizziness. Pt agrees with plan.        Other Visit Diagnoses    Feeling of chest tightness       Relevant Orders   EKG 12-Lead (Completed)       No orders of the defined types were placed in this encounter.  Orders Placed This Encounter  Procedures  . EKG 12-Lead    Patient Instructions  I think combination of high dose flu shot and recent cross taper from gabapentin to lyrica contributed to today's symptoms. EKG looking ok today.  I'm glad you're feeling better.  We should probably avoid high dose flu shots in the future.    Follow up plan: Return if symptoms worsen or fail to improve.  Ria Bush, MD

## 2018-12-09 ENCOUNTER — Other Ambulatory Visit: Payer: Self-pay | Admitting: Family Medicine

## 2018-12-09 DIAGNOSIS — R0602 Shortness of breath: Secondary | ICD-10-CM

## 2018-12-09 DIAGNOSIS — J449 Chronic obstructive pulmonary disease, unspecified: Secondary | ICD-10-CM

## 2018-12-09 DIAGNOSIS — G4733 Obstructive sleep apnea (adult) (pediatric): Secondary | ICD-10-CM

## 2018-12-09 NOTE — Progress Notes (Signed)
Referral to pulmonary for DOE, OSA on CPAP, COPD

## 2018-12-11 ENCOUNTER — Other Ambulatory Visit: Payer: Self-pay

## 2018-12-11 ENCOUNTER — Ambulatory Visit
Admission: RE | Admit: 2018-12-11 | Discharge: 2018-12-11 | Disposition: A | Discharge status: Home / Self Care | Payer: Medicare Other | Source: Ambulatory Visit | Attending: Neurology | Admitting: Neurology

## 2018-12-11 DIAGNOSIS — R413 Other amnesia: Secondary | ICD-10-CM | POA: Diagnosis present

## 2018-12-11 LAB — POCT I-STAT CREATININE: Creatinine, Ser: 0.7 mg/dL (ref 0.44–1.00)

## 2018-12-11 MED ORDER — GADOBUTROL 1 MMOL/ML IV SOLN
7.0000 mL | Freq: Once | INTRAVENOUS | Status: AC | PRN
  Administered 2018-12-11: 7 mL via INTRAVENOUS

## 2018-12-26 ENCOUNTER — Other Ambulatory Visit: Payer: Self-pay

## 2018-12-26 ENCOUNTER — Encounter
Admission: RE | Admit: 2018-12-26 | Discharge: 2018-12-26 | Disposition: A | Discharge status: Home / Self Care | Payer: Medicare Other | Source: Ambulatory Visit | Attending: Urology | Admitting: Urology

## 2018-12-26 DIAGNOSIS — Z20828 Contact with and (suspected) exposure to other viral communicable diseases: Secondary | ICD-10-CM | POA: Insufficient documentation

## 2018-12-26 DIAGNOSIS — Z01812 Encounter for preprocedural laboratory examination: Secondary | ICD-10-CM | POA: Insufficient documentation

## 2018-12-26 HISTORY — DX: Dyspnea, unspecified: R06.00

## 2018-12-26 HISTORY — DX: Unspecified asthma, uncomplicated: J45.909

## 2018-12-26 LAB — SARS CORONAVIRUS 2 (TAT 6-24 HRS): SARS Coronavirus 2: NEGATIVE

## 2018-12-26 NOTE — Patient Instructions (Addendum)
Your procedure is scheduled on: Mon 11/2 Report to Day Surgery. To find out your arrival time please call [phone] between 1PM - 3PM on Friday 11/30.  Remember: Instructions that are not followed completely may result in serious medical risk,  up to and including death, or upon the discretion of your surgeon and anesthesiologist your  surgery may need to be rescheduled.     _X__ 1. Do not eat food after midnight the night before your procedure.                 No gum chewing or hard candies. You may drink clear liquids up to 2 hours                 before you are scheduled to arrive for your surgery- DO not drink clear                 liquids within 2 hours of the start of your surgery.                 Clear Liquids include:  water, apple juice without pulp, clear carbohydrate                 drink such as Clearfast of Gatorade, Black Coffee or Tea (Do not add                 anything to coffee or tea).  __X__2.  On the morning of surgery brush your teeth with toothpaste and water, you                may rinse your mouth with mouthwash if you wish.  Do not swallow any toothpaste of mouthwash.     ___ 3.  No Alcohol for 24 hours before or after surgery.   ___ 4.  Do Not Smoke or use e-cigarettes For 24 Hours Prior to Your Surgery.                 Do not use any chewable tobacco products for at least 6 hours prior to                 surgery.  ____  5.  Bring all medications with you on the day of surgery if instructed.   __x__  6.  Notify your doctor if there is any change in your medical condition      (cold, fever, infections).     Do not wear jewelry, make-up, hairpins, clips or nail polish. Do not wear lotions, powders, or perfumes. You may wear deodorant. Do not shave 48 hours prior to surgery. Men may shave face and neck. Do not bring valuables to the hospital.    [facility] is not responsible for any belongings or valuables.  Contacts, dentures  or bridgework may not be worn into surgery. Leave your suitcase in the car. After surgery it may be brought to your room. For patients admitted to the hospital, discharge time is determined by your treatment team.   Patients discharged the day of surgery will not be allowed to drive home.   Please read over the following fact sheets that you were given:    __x__ Take these medicines the morning of surgery with A SIP OF WATER:    1.acetaminophen (TYLENOL) 650 MG CR tablet if needed  2. amLODipine (NORVASC) 5 MG tablet  3. DEXILANT 60 MG capsule  Night before and morning of surgery  4.gabapentin (NEURONTIN) 300 MG capsule  5.pregabalin (  LYRICA) 100 MG capsule  6.  ____ Fleet Enema (as directed)   ___ Use CHG Soap as directed   _x___ Use inhalers on the day of surgeryalbuterol (VENTOLIN HFA) 108 (90 Base) MCG/ACT inhaler    ____ Stop metformin 2 days prior to surgery    ____ Take 1/2 of usual insulin dose the night before surgery. No insulin the morning          of surgery.   __x__ Stop aspirin today  __x__ Stop Anti-inflammatories  meloxicam (MOBIC) 15 MG tablet today   ____ Stop supplements until after surgery.    _x___ Bring C-Pap to the hospital.

## 2018-12-27 NOTE — H&P (Signed)
I was consulted to assess the patient's suprapubic pain.  3 or 4 times a night she wakes up with suprapubic discomfort that is quite severe.  It is relieved by voiding.  If she goes frequently during the day she does not have the pain unless she tries to hold it.  She voids every 2 hour in somewhat of a time voiding fashion gets up to 3-4 times a night.  She rarely at night if she holds it has foot on the floor syndrome is continent during the day  [place] and she said that hydroxyzine helped minimally and she failed amitriptyline and is also been given local estrogen cream.  She thinks she gets 3 or 4 bladder infections a year with frequency urethral dysuria that respond favorably antibiotic and has been worked up for microhematuria.  [place] 02, 2018 demonstrating a right renal cyst and had a recent normal colonoscopy for possible enteritis.  Patient has never been on urinary prophylaxis.  He takes gabapentin for neuropathy and Mobic for arthritis.  She is prone to constipation.  She has had a hysterectomy  No history of previous GU surgery.  No kidney stones  Modifying factors: There are no other modifying factors  Associated signs and symptoms: There are no other associated signs and symptoms Aggravating and relieving factors: There are no other aggravating or relieving factors Severity: Moderate Duration: Persistent   PMH:     Past Medical History:  Diagnosis Date  . Arthritis   . Colon polyps   . GERD (gastroesophageal reflux disease)   . Hyperlipidemia   . Hypertension   . Prediabetes   . RLS (restless legs syndrome)   . SOB (shortness of breath)   . Urinary incontinence   . Urinary tract infection     Surgical History:      Past Surgical History:  Procedure Laterality Date  . ABDOMINAL HYSTERECTOMY  1972  . APPENDECTOMY  1958  . BACK SURGERY    . BILATERAL SALPINGOOPHORECTOMY  01/07/2018   . COLONOSCOPY WITH PROPOFOL N/A 11/11/2018   Procedure: COLONOSCOPY WITH PROPOFOL;  Surgeon: [name], MD;  Location: [facility] ENDOSCOPY;  Service: Gastroenterology;  Laterality: N/A;  . [place] (x1), 2018(x3)  . SPINAL FUSION  2002  . TONSILLECTOMY AND ADENOIDECTOMY  1961    Home Medications:       Allergies as of 11/18/2018      Reactions   Bee Pollen Hives   Ciprofloxacin Hcl    Sulfa Antibiotics Rash   Bactrim Bactrim, Cipro         Medication List       Accurate as of November 18, 2018 10:23 AM. If you have any questions, ask your nurse or doctor.        STOP taking these medications   predniSONE 10 MG tablet Commonly known as: DELTASONE Stopped by: Reece Packer, MD     TAKE these medications   albuterol 108 (90 Base) MCG/ACT inhaler Commonly known as: VENTOLIN HFA Inhale 2 puffs into the lungs every 4 (four) hours as needed for wheezing or shortness of breath (cough, shortness of breath or wheezing.).   amitriptyline 25 MG tablet Commonly known as: ELAVIL Take 1 tablet (25 mg total) by mouth at bedtime.   amLODipine 5 MG tablet Commonly known as: NORVASC Take 1 tablet (5 mg total) by mouth daily.   aspirin 81  MG chewable tablet Chew 81 mg DAILY.   Cholecalciferol 50 MCG (2000 UT) Tabs Take by mouth.   Dexilant 60 MG capsule Generic drug: dexlansoprazole Take 1 capsule (60 mg total) by mouth daily.   diclofenac sodium 1 % Gel Commonly known as: VOLTAREN Apply 1 application topically 2 (two) times daily as needed.   dicyclomine 10 MG capsule Commonly known as: Bentyl Take 1 capsule (10 mg total) by mouth at bedtime.   EPINEPHrine 0.3 mg/0.3 mL Soaj injection Commonly known as: EPI-PEN Inject into the muscle.   estradiol 0.1 MG/GM vaginal cream Commonly known as: ESTRACE Place 1 Applicatorful vaginally 3 (three) times a week.   Fusion Plus Caps Take 1 capsule by mouth daily with  breakfast.   gabapentin 300 MG capsule Commonly known as: NEURONTIN Take 3-4 capsules (900-1,200 mg total) by mouth 2 (two) times daily. Take 3 tablets in am, 4 tablets pm   hydrochlorothiazide 25 MG tablet Commonly known as: HYDRODIURIL Take 1 tablet (25 mg total) by mouth daily.   hydrOXYzine 25 MG tablet Commonly known as: ATARAX/VISTARIL Take 1 tablet (25 mg total) by mouth 2 (two) times daily.   loratadine 10 MG tablet Commonly known as: CLARITIN Take by mouth.   Melatonin Maximum Strength 5 MG Tabs Generic drug: Melatonin Take by mouth.   meloxicam 15 MG tablet Commonly known as: MOBIC Take 1 tablet (15 mg total) by mouth daily as needed.   polyethylene glycol 17 g packet Commonly known as: MIRALAX / GLYCOLAX Take 1 packet by mouth daily as needed.   rOPINIRole 0.5 MG tablet Commonly known as: REQUIP Take 1-3 tablets (0.5-1.5 mg total) by mouth 3 (three) times daily. Take 1 tablet am, 1 pm and 3 tablets at bedtime   simvastatin 10 MG tablet Commonly known as: ZOCOR Take 1 tablet (10 mg total) by mouth daily before supper.   vitamin B-12 1000 MCG tablet Commonly known as: CYANOCOBALAMIN Take by mouth.       Allergies:       Allergies  Allergen Reactions  . Bee Pollen Hives  . Ciprofloxacin Hcl   . Sulfa Antibiotics Rash    Bactrim Bactrim, Cipro     Family History:      Family History  Problem Relation Age of Onset  . Alcohol abuse Mother   . Cancer Mother   . Stroke Mother   . Alcohol abuse Father   . Arthritis Sister   . Arthritis Daughter   . COPD Daughter   . Arthritis Paternal Grandmother   . Asthma Paternal Grandmother   . COPD Paternal Grandmother     Social History:  reports that she has never smoked. She has never used smokeless tobacco. She reports current alcohol use. She reports that she does not use drugs.  ROS: UROLOGY Frequent Urination?: Yes Hard to postpone urination?: No Burning/pain  with urination?: Yes Leakage of urine?: No Urine stream starts and stops?: No Trouble starting stream?: No Do you have to strain to urinate?: No Blood in urine?: Yes Urinary tract infection?: Yes Sexually transmitted disease?: No Injury to kidneys or bladder?: No Painful intercourse?: No Weak stream?: No Currently pregnant?: No Vaginal bleeding?: No Last menstrual period?: n  Gastrointestinal Nausea?: No Vomiting?: No Indigestion/heartburn?: No Diarrhea?: No Constipation?: Yes  Constitutional Fever: No Night sweats?: No Weight loss?: No Fatigue?: No  Skin Skin rash/lesions?: No Itching?: No  Eyes Blurred vision?: No Double vision?: No  Ears/Nose/Throat Sore throat?: No Sinus problems?: No  Hematologic/Lymphatic Swollen  glands?: No Easy bruising?: No  Cardiovascular Leg swelling?: No Chest pain?: No  Respiratory Cough?: No Shortness of breath?: No  Endocrine Excessive thirst?: No  Musculoskeletal Back pain?: Yes Joint pain?: Yes  Neurological Headaches?: No Dizziness?: No  Psychologic Depression?: No Anxiety?: No  Physical Exam: BP 137/72   Pulse 73   Ht 4' 10"  (1.473 m)   Wt 71.7 kg   LMP  (LMP Unknown)   BMI 33.02 kg/m   Constitutional:  Alert and oriented, No acute distress. HEENT: [place] AT, moist mucus membranes.  Trachea midline, no masses. Cardiovascular: No clubbing, cyanosis, or edema. Respiratory: Normal respiratory effort, no increased work of breathing. GI: Abdomen is soft, nontender, nondistended, no abdominal masses GU: High grade 1 cystocele with little to no rectocele and no stress incontinence.  Well supported bladder neck.  Tissues looked atrophied.  Bladder and introitus was minimally tender Skin: No rashes, bruises or suspicious lesions. Lymph: No cervical or inguinal adenopathy. Neurologic: Grossly intact, no focal deficits, moving all 4 extremities. Psychiatric: Normal mood and affect.  Laboratory  Data: Recent Labs  No results found for: WBC, HGB, HCT, MCV, PLT    Recent Labs  No results found for: CREATININE    Recent Labs  No results found for: PSA    Recent Labs  No results found for: TESTOSTERONE    Recent Labs  No results found for: HGBA1C    Urinalysis Labs (Brief)  No results found for: COLORURINE, APPEARANCEUR, LABSPEC, [place], GLUCOSEU, HGBUR, BILIRUBINUR, KETONESUR, PROTEINUR, UROBILINOGEN, NITRITE, LEUKOCYTESUR    Pertinent Imaging:   Assessment & Plan: Patient may have interstitial cystitis and likely does.  The role of a hydrodistention discussed.  Patient had been given a diagnosis of interstitial cystitis I do state and describes a normal cystoscopy.  Work-up for hematuria normal.  Has never had a hydrodistention.  Usual template utilized.  Rescue treatments she was aware of but I would like to do the hydrodistention first for diagnosis and hopefully for treatment and to rule out ulcer.  We will call if urine culture is positive

## 2018-12-30 ENCOUNTER — Other Ambulatory Visit: Payer: Self-pay

## 2018-12-30 ENCOUNTER — Ambulatory Visit
Admission: RE | Admit: 2018-12-30 | Discharge: 2018-12-30 | Disposition: A | Discharge status: Home / Self Care | Payer: Medicare Other | Attending: Urology | Admitting: Urology

## 2018-12-30 ENCOUNTER — Ambulatory Visit: Payer: Medicare Other | Admitting: Anesthesiology

## 2018-12-30 ENCOUNTER — Encounter
Admission: RE | Disposition: A | Discharge status: Home / Self Care | Payer: Self-pay | Source: Home / Self Care | Attending: Urology

## 2018-12-30 ENCOUNTER — Encounter: Payer: Self-pay | Admitting: Anesthesiology

## 2018-12-30 DIAGNOSIS — Z881 Allergy status to other antibiotic agents status: Secondary | ICD-10-CM | POA: Diagnosis not present

## 2018-12-30 DIAGNOSIS — Z7982 Long term (current) use of aspirin: Secondary | ICD-10-CM | POA: Insufficient documentation

## 2018-12-30 DIAGNOSIS — Z882 Allergy status to sulfonamides status: Secondary | ICD-10-CM | POA: Diagnosis not present

## 2018-12-30 DIAGNOSIS — E785 Hyperlipidemia, unspecified: Secondary | ICD-10-CM | POA: Insufficient documentation

## 2018-12-30 DIAGNOSIS — N3010 Interstitial cystitis (chronic) without hematuria: Secondary | ICD-10-CM | POA: Insufficient documentation

## 2018-12-30 DIAGNOSIS — G4730 Sleep apnea, unspecified: Secondary | ICD-10-CM | POA: Insufficient documentation

## 2018-12-30 DIAGNOSIS — G629 Polyneuropathy, unspecified: Secondary | ICD-10-CM | POA: Insufficient documentation

## 2018-12-30 DIAGNOSIS — J449 Chronic obstructive pulmonary disease, unspecified: Secondary | ICD-10-CM | POA: Diagnosis not present

## 2018-12-30 DIAGNOSIS — M1990 Unspecified osteoarthritis, unspecified site: Secondary | ICD-10-CM | POA: Diagnosis not present

## 2018-12-30 DIAGNOSIS — Z791 Long term (current) use of non-steroidal anti-inflammatories (NSAID): Secondary | ICD-10-CM | POA: Diagnosis not present

## 2018-12-30 DIAGNOSIS — G2581 Restless legs syndrome: Secondary | ICD-10-CM | POA: Diagnosis not present

## 2018-12-30 DIAGNOSIS — K219 Gastro-esophageal reflux disease without esophagitis: Secondary | ICD-10-CM | POA: Diagnosis not present

## 2018-12-30 DIAGNOSIS — Z79899 Other long term (current) drug therapy: Secondary | ICD-10-CM | POA: Diagnosis not present

## 2018-12-30 DIAGNOSIS — I10 Essential (primary) hypertension: Secondary | ICD-10-CM | POA: Insufficient documentation

## 2018-12-30 HISTORY — PX: CYSTO WITH HYDRODISTENSION: SHX5453

## 2018-12-30 SURGERY — CYSTOSCOPY, WITH BLADDER HYDRODISTENSION
Anesthesia: General | Site: Bladder

## 2018-12-30 MED ORDER — FLUORESCEIN SODIUM 10 % IV SOLN
INTRAVENOUS | Status: AC
  Filled 2018-12-30: qty 5

## 2018-12-30 MED ORDER — BUPIVACAINE HCL (PF) 0.5 % IJ SOLN
INTRAMUSCULAR | Status: AC
  Filled 2018-12-30: qty 30

## 2018-12-30 MED ORDER — PROPOFOL 10 MG/ML IV BOLUS
INTRAVENOUS | Status: DC | PRN
  Administered 2018-12-30: 150 mg via INTRAVENOUS

## 2018-12-30 MED ORDER — PROPOFOL 10 MG/ML IV BOLUS
INTRAVENOUS | Status: AC
  Filled 2018-12-30: qty 20

## 2018-12-30 MED ORDER — OXYCODONE HCL 5 MG PO TABS
5.0000 mg | ORAL_TABLET | Freq: Once | ORAL | Status: AC | PRN
  Administered 2018-12-30: 5 mg via ORAL

## 2018-12-30 MED ORDER — BUPIVACAINE HCL (PF) 0.5 % IJ SOLN
INTRAMUSCULAR | Status: DC | PRN
  Administered 2018-12-30: 30 mL

## 2018-12-30 MED ORDER — BELLADONNA ALKALOIDS-OPIUM 16.2-60 MG RE SUPP
RECTAL | Status: AC
  Filled 2018-12-30: qty 1

## 2018-12-30 MED ORDER — LACTATED RINGERS IV SOLN
INTRAVENOUS | Status: DC | PRN
  Administered 2018-12-30: 14:00:00 via INTRAVENOUS

## 2018-12-30 MED ORDER — LIDOCAINE HCL URETHRAL/MUCOSAL 2 % EX GEL
CUTANEOUS | Status: AC
  Filled 2018-12-30: qty 10

## 2018-12-30 MED ORDER — LIDOCAINE HCL (CARDIAC) PF 100 MG/5ML IV SOSY
PREFILLED_SYRINGE | INTRAVENOUS | Status: DC | PRN
  Administered 2018-12-30: 80 mg via INTRAVENOUS

## 2018-12-30 MED ORDER — FENTANYL CITRATE (PF) 100 MCG/2ML IJ SOLN: 25.0000 ug | INTRAMUSCULAR | Status: DC | PRN

## 2018-12-30 MED ORDER — CEFAZOLIN SODIUM-DEXTROSE 2-4 GM/100ML-% IV SOLN
INTRAVENOUS | Status: AC
  Filled 2018-12-30: qty 100

## 2018-12-30 MED ORDER — HYDROCODONE-ACETAMINOPHEN 5-325 MG PO TABS: 1.0000 | ORAL_TABLET | Freq: Four times a day (QID) | ORAL | 0 refills | Status: DC | PRN

## 2018-12-30 MED ORDER — OXYCODONE HCL 5 MG PO TABS
ORAL_TABLET | ORAL | Status: AC
  Filled 2018-12-30: qty 1

## 2018-12-30 MED ORDER — OXYCODONE HCL 5 MG/5ML PO SOLN: 5.0000 mg | Freq: Once | ORAL | Status: AC | PRN

## 2018-12-30 MED ORDER — FENTANYL CITRATE (PF) 100 MCG/2ML IJ SOLN
INTRAMUSCULAR | Status: DC | PRN
  Administered 2018-12-30: 50 ug via INTRAVENOUS

## 2018-12-30 MED ORDER — FENTANYL CITRATE (PF) 100 MCG/2ML IJ SOLN
INTRAMUSCULAR | Status: AC
  Filled 2018-12-30: qty 2

## 2018-12-30 MED ORDER — METHYLENE BLUE 0.5 % INJ SOLN
INTRAVENOUS | Status: AC
  Filled 2018-12-30: qty 10

## 2018-12-30 MED ORDER — LIDOCAINE HCL (PF) 2 % IJ SOLN
INTRAMUSCULAR | Status: AC
  Filled 2018-12-30: qty 10

## 2018-12-30 SURGICAL SUPPLY — 13 items
BAG DRAIN CYSTO-URO LG1000N (MISCELLANEOUS) ×3 IMPLANT
CATH ROBINSON RED A/P 16FR (CATHETERS) ×3 IMPLANT
DRAPE UNDER BUTTOCK W/FLU (DRAPES) IMPLANT
GLOVE BIOGEL M STRL SZ7.5 (GLOVE) ×3 IMPLANT
GOWN STRL REUS W/TWL XL LVL3 (GOWN DISPOSABLE) ×3 IMPLANT
NDL HYPO 25X1 1.5 SAFETY (NEEDLE) IMPLANT
NEEDLE HYPO 25X1 1.5 SAFETY (NEEDLE) IMPLANT
PACK CYSTO AR (MISCELLANEOUS) ×3 IMPLANT
SET CYSTO W/LG BORE CLAMP LF (SET/KITS/TRAYS/PACK) ×3 IMPLANT
SURGILUBE 2OZ TUBE FLIPTOP (MISCELLANEOUS) ×3 IMPLANT
SYR 10ML LL (SYRINGE) ×3 IMPLANT
TUBING CONNECTING 10 (TUBING) ×2 IMPLANT
TUBING CONNECTING 10' (TUBING) ×1

## 2018-12-30 NOTE — Progress Notes (Signed)
Report given to OR nurse

## 2018-12-30 NOTE — Progress Notes (Signed)
CV exam and HS normal Lung exam normal

## 2018-12-30 NOTE — Anesthesia Procedure Notes (Signed)
Procedure Name: LMA Insertion Date/Time: 12/30/2018 2:55 PM Performed by: Allean Found, CRNA Pre-anesthesia Checklist: Patient identified, Patient being monitored, Timeout performed, Emergency Drugs available and Suction available Patient Re-evaluated:Patient Re-evaluated prior to induction Oxygen Delivery Method: Circle system utilized Preoxygenation: Pre-oxygenation with 100% oxygen Induction Type: IV induction Ventilation: Mask ventilation without difficulty LMA: LMA inserted LMA Size: 4.0 Tube type: Oral Number of attempts: 1 Placement Confirmation: positive ETCO2 and breath sounds checked- equal and bilateral Tube secured with: Tape Dental Injury: Teeth and Oropharynx as per pre-operative assessment

## 2018-12-30 NOTE — Op Note (Signed)
Preoperative diagnosis: Interstitial cystitis Postoperative diagnosis: Chronic interstitial cystitis Surgery: Cystoscopy bladder hydrodistention and bladder instillation treatment Surgeon: Dr. Nicki Reaper Suraya Vidrine  The patient has the above diagnosis and consented the above procedure.  She has been refractory to medication.  Preoperative antibiotics were given.  Extra care was taken leg positioning to minimize the risk of compartment syndrome and neuropathy and deep vein thrombosis  Initial cystoscopy was normal.  Bladder mucosa and trigone were normal.  No cystitis or carcinoma.  She was hydrodistended for 5 minutes at 500 mL capacity  On reinspection of the bladder there were no findings in keeping with interstitial cystitis.  She had diffuse submucosal blood vessels within normal limits.  No ulcers.  As a separate procedure after emptying her bladder I instilled 30 cc of 0.5% Marcaine with a red rubber catheter.  She was taken to recovery and be treated for interstitial cystitis

## 2018-12-30 NOTE — H&P (Addendum)
was consulted to assess the patient's suprapubic pain. 3 or 4 times a night she wakes up with suprapubic discomfort that is quite severe. It is relieved by voiding. If she goes frequently during the day she does not have the pain unless she tries to hold it. She voids every 2 hour in somewhat of a time voiding fashion gets up to 3-4 times a night. She rarely at night if she holds it has foot on the floor syndrome is continent during the day  [place] and she said that hydroxyzine helped minimally and she failed amitriptyline and is also been given local estrogen cream.  She thinks she gets 3 or 4 bladder infections a year with frequency urethral dysuria that respond favorably antibiotic and has been worked up for microhematuria. She [place], 2018 demonstrating a right renal cyst and had a recent normalcolonoscopy for possible enteritis.  Patient has never been on urinary prophylaxis. He takes gabapentin for neuropathy and Mobic for arthritis.  She is prone to constipation. She has had a hysterectomy  No history of previous GU surgery. No kidney stones  Modifying factors: There are no other modifying factors  Associated signs and symptoms: There are no other associated signs and symptoms Aggravating and relieving factors: There are no other aggravating or relieving factors Severity: Moderate Duration: Persistent   PMH:     Past Medical History:  Diagnosis Date  . Arthritis   . Colon polyps   . GERD (gastroesophageal reflux disease)   . Hyperlipidemia   . Hypertension   . Prediabetes   . RLS (restless legs syndrome)   . SOB (shortness of breath)   . Urinary incontinence   . Urinary tract infection     Surgical History:      Past Surgical History:  Procedure Laterality Date  . ABDOMINAL HYSTERECTOMY  1972  . APPENDECTOMY  1958  . BACK SURGERY    . BILATERAL SALPINGOOPHORECTOMY   01/07/2018  . COLONOSCOPY WITH PROPOFOL N/A 11/11/2018   Procedure: COLONOSCOPY WITH PROPOFOL; Surgeon: [name], MD; Location: [facility] ENDOSCOPY; Service: Gastroenterology; Laterality: N/A;  . [place] (x1), 2018(x3)  . SPINAL FUSION  2002  . TONSILLECTOMY AND ADENOIDECTOMY  1961    Home Medications:              Allergies as of 11/18/2018    Reactions   Bee Pollen Hives   Ciprofloxacin Hcl    Sulfa Antibiotics Rash   Bactrim Bactrim, Cipro           Medication List            Accurate as of November 18, 2018 10:23 AM.If you have any questions, ask your nurse or doctor.              STOP taking these medications    predniSONE10 MG tablet Commonly known as: DELTASONE Stopped by: Reece Packer, MD         TAKE these medications    albuterol108 (90 Base) MCG/ACT inhaler Commonly known as: VENTOLIN HFA Inhale 2 puffs into the lungs every 4 (four) hours as needed for wheezing or shortness of breath (cough, shortness of breath or wheezing.).     amitriptyline25 MG tablet Commonly known as: ELAVIL Take 1 tablet (25 mg total) by mouth at bedtime.     amLODipine5 MG tablet Commonly known as: NORVASC Take 1 tablet (5 mg  total) by mouth daily.     aspirin81 MG chewable tablet Chew 81 mg DAILY.     Cholecalciferol50 MCG (2000 UT) Tabs Take by mouth.     Dexilant60 MG capsule Generic drug: dexlansoprazole Take 1 capsule (60 mg total) by mouth daily.     diclofenac sodium1 % Gel Commonly known as: VOLTAREN Apply 1 application topically 2 (two) times daily as needed.     dicyclomine10 MG capsule Commonly known as: Bentyl Take 1 capsule (10 mg total) by mouth at bedtime.     EPINEPHrine0.3 mg/0.3 mL Soaj injection Commonly known as: EPI-PEN Inject into the muscle.     estradiol0.1 MG/GM vaginal cream Commonly known as: ESTRACE Place 1 Applicatorful vaginally 3 (three) times a  week.     Fusion PlusCaps Take 1 capsule by mouth daily with breakfast.     gabapentin300 MG capsule Commonly known as: NEURONTIN Take 3-4 capsules (900-1,200 mg total) by mouth 2 (two) times daily. Take 3 tablets in am, 4 tablets pm     hydrochlorothiazide25 MG tablet Commonly known as: HYDRODIURIL Take 1 tablet (25 mg total) by mouth daily.     hydrOXYzine25 MG tablet Commonly known as: ATARAX/VISTARIL Take 1 tablet (25 mg total) by mouth 2 (two) times daily.     loratadine10 MG tablet Commonly known as: CLARITIN Take by mouth.     Melatonin Maximum Strength5 MG Tabs Generic drug: Melatonin Take by mouth.     meloxicam15 MG tablet Commonly known as: MOBIC Take 1 tablet (15 mg total) by mouth daily as needed.     polyethylene glycol17 g packet Commonly known as: MIRALAX / GLYCOLAX Take 1 packet by mouth daily as needed.     rOPINIRole0.5 MG tablet Commonly known as: REQUIP Take 1-3 tablets (0.5-1.5 mg total) by mouth 3 (three) times daily. Take 1 tablet am, 1 pm and 3 tablets at bedtime     simvastatin10 MG tablet Commonly known as: ZOCOR Take 1 tablet (10 mg total) by mouth daily before supper.     vitamin B-121000 MCG tablet Commonly known as: CYANOCOBALAMIN Take by mouth.           Allergies:      Allergies  Allergen Reactions  . Bee Pollen Hives  . Ciprofloxacin Hcl   . Sulfa Antibiotics Rash    Bactrim Bactrim, Cipro     Family History:      Family History  Problem Relation Age of Onset  . Alcohol abuse Mother   . Cancer Mother   . Stroke Mother   . Alcohol abuse Father   . Arthritis Sister   . Arthritis Daughter   . COPD Daughter   . Arthritis Paternal Grandmother   . Asthma Paternal Grandmother   . COPD Paternal Grandmother     Social History:reports that she has never smoked. She has never used smokeless tobacco. She reports current alcohol use. She reports that she does not use  drugs.  ROS: UROLOGY Frequent Urination?: Yes Hard to postpone urination?: No Burning/pain with urination?: Yes Leakage of urine?: No Urine stream starts and stops?: No Trouble starting stream?: No Do you have to strain to urinate?: No Blood in urine?: Yes Urinary tract infection?: Yes Sexually transmitted disease?: No Injury to kidneys or bladder?: No Painful intercourse?: No Weak stream?: No Currently pregnant?: No Vaginal bleeding?: No Last menstrual period?: n  Gastrointestinal Nausea?: No Vomiting?: No Indigestion/heartburn?: No Diarrhea?: No Constipation?: Yes  Constitutional Fever: No Night sweats?: No Weight loss?: No Fatigue?:  No  Skin Skin rash/lesions?: No Itching?: No  Eyes Blurred vision?: No Double vision?: No  Ears/Nose/Throat Sore throat?: No Sinus problems?: No  Hematologic/Lymphatic Swollen glands?: No Easy bruising?: No  Cardiovascular Leg swelling?: No Chest pain?: No  Respiratory Cough?: No Shortness of breath?: No  Endocrine Excessive thirst?: No  Musculoskeletal Back pain?: Yes Joint pain?: Yes  Neurological Headaches?: No Dizziness?: No  Psychologic Depression?: No Anxiety?: No  Physical Exam: BP 137/72  Pulse 73  Ht 4' 10"  (1.473 m)  Wt 71.7 kg  LMP (LMP Unknown)  BMI 33.02 kg/m  Constitutional: Alert and oriented, No acute distress. HEENT: [place] AT, moist mucus membranes. Trachea midline, no masses. Cardiovascular: No clubbing, cyanosis, or edema. Respiratory:Normal respiratory effort,no increased work of breathing. GI: Abdomen is soft, nontender, nondistended, no abdominal masses IO:EVOJ grade 1 cystocele with little to no rectocele and no stress incontinence. Well supported bladder neck. Tissues looked atrophied. Bladder and introitus was minimally tender Skin: No rashes, bruises or suspicious lesions. Lymph: No cervical or inguinal adenopathy. Neurologic: Grossly intact, no  focal deficits, moving all 4 extremities. Psychiatric: Normal mood and affect.  Laboratory Data: Recent Labs  No results found for: WBC, HGB, HCT, MCV, PLT    Recent Labs  No results found for: CREATININE    Recent Labs  No results found for: PSA    Recent Labs  No results found for: TESTOSTERONE    Recent Labs  No results found for: HGBA1C    Urinalysis Labs (Brief)  No results found for: COLORURINE, APPEARANCEUR, [place], [place], GLUCOSEU, HGBUR, BILIRUBINUR, KETONESUR, PROTEINUR, UROBILINOGEN, NITRITE, LEUKOCYTESUR    Pertinent Imaging:   Assessment & Plan:Patient may have interstitial cystitis and likely does. The role of a hydrodistention discussed. Patient had been given a diagnosis of interstitial cystitis I do state and describes a normal cystoscopy. Work-up for hematuria normal. Has never had a hydrodistention. Usual template utilized.Rescue treatments she was aware of but I would like to do the hydrodistention first for diagnosis and hopefully for treatment and to rule out ulcer. We will call if urine culture is positive   After a thorough review of the management options for the patient's condition the patient  elected to proceed with surgical therapy as noted above. We have discussed the potential benefits and risks of the procedure, side effects of the proposed treatment, the likelihood of the patient achieving the goals of the procedure, and any potential problems that might occur during the procedure or recuperation. Informed consent has been obtained.

## 2018-12-30 NOTE — Transfer of Care (Signed)
Immediate Anesthesia Transfer of Care Note  Patient: Erin Petersen  Procedure(s) Performed: CYSTOSCOPY/HYDRODISTENSION WITH MARCAINE (N/A Bladder)  Patient Location: PACU  Anesthesia Type:General  Level of Consciousness: sedated  Airway & Oxygen Therapy: Patient Spontanous Breathing and Patient connected to face mask oxygen  Post-op Assessment: Report given to RN and Post -op Vital signs reviewed and stable  Post vital signs: Reviewed and stable  Last Vitals:  Vitals Value Taken Time  BP 141/65 12/30/18 1421  Temp    Pulse 72 12/30/18 1421  Resp    SpO2 98 % 12/30/18 1421  Vitals shown include unvalidated device data.  Last Pain:  Vitals:   12/30/18 1227  TempSrc: Tympanic  PainSc: 0-No pain         Complications: No apparent anesthesia complications

## 2018-12-30 NOTE — Discharge Instructions (Signed)
I have reviewed discharge instructions in detail with the patient. They will follow-up with me or their physician as scheduled. My nurse will also be calling the patients as per protocol.     AMBULATORY SURGERY  DISCHARGE INSTRUCTIONS   1) The drugs that you were given will stay in your system until tomorrow so for the next 24 hours you should not:  A) Drive an automobile B) Make any legal decisions C) Drink any alcoholic beverage   2) You may resume regular meals tomorrow.  Today it is better to start with liquids and gradually work up to solid foods.  You may eat anything you prefer, but it is better to start with liquids, then soup and crackers, and gradually work up to solid foods.   3) Please notify your doctor immediately if you have any unusual bleeding, trouble breathing, redness and pain at the surgery site, drainage, fever, or pain not relieved by medication.     Please contact your physician with any problems or Same Day Surgery at [phone], Monday through Friday 6 am to 4 pm, or [place] at [facility] number at [phone].

## 2018-12-30 NOTE — Interval H&P Note (Signed)
History and Physical Interval Note:  12/30/2018 12:55 PM  Erin Petersen  has presented today for surgery, with the diagnosis of chronic cystitis.  The various methods of treatment have been discussed with the patient and family. After consideration of risks, benefits and other options for treatment, the patient has consented to  Procedure(s): CYSTOSCOPY/HYDRODISTENSION WITH MARCAINE (N/A) as a surgical intervention.  The patient's history has been reviewed, patient examined, no change in status, stable for surgery.  I have reviewed the patient's chart and labs.  Questions were answered to the patient's satisfaction.     Dionisio Aragones A Omkar Stratmann

## 2018-12-30 NOTE — Anesthesia Post-op Follow-up Note (Signed)
Anesthesia QCDR form completed.        

## 2018-12-30 NOTE — Anesthesia Preprocedure Evaluation (Signed)
Anesthesia Evaluation  Patient identified by MRN, date of birth, ID band Patient awake    Reviewed: Allergy & Precautions, H&P , NPO status , Patient's Chart, lab work & pertinent test results  History of Anesthesia Complications Negative for: history of anesthetic complications  Airway Mallampati: II  TM Distance: >3 FB Neck ROM: limited    Dental  (+) Chipped   Pulmonary shortness of breath and with exertion, asthma , sleep apnea , COPD,           Cardiovascular Exercise Tolerance: Good hypertension, (-) angina(-) Past MI and (-) DOE      Neuro/Psych  Neuromuscular disease negative psych ROS   GI/Hepatic Neg liver ROS, GERD  Medicated and Controlled,  Endo/Other  negative endocrine ROS  Renal/GU      Musculoskeletal   Abdominal   Peds  Hematology negative hematology ROS (+)   Anesthesia Other Findings Past Medical History: No date: Arthritis No date: Asthma     Comment:  exercise induced asthma No date: Colon polyps No date: Dyspnea No date: GERD (gastroesophageal reflux disease) No date: Hyperlipidemia No date: Hypertension No date: Prediabetes No date: RLS (restless legs syndrome) No date: SOB (shortness of breath) No date: Urinary incontinence No date: Urinary tract infection  Past Surgical History: 1972: ABDOMINAL HYSTERECTOMY 1958: APPENDECTOMY No date: BACK SURGERY     Comment:  lumbar fusion 01/07/2018: BILATERAL SALPINGOOPHORECTOMY 11/11/2018: COLONOSCOPY WITH PROPOFOL; N/A     Comment:  Procedure: COLONOSCOPY WITH PROPOFOL;  Surgeon: [name], MD;  Location: [facility] ENDOSCOPY;  Service:               Gastroenterology;  Laterality: N/A; No date: HERNIA REPAIR     Comment:  abd No date: HERNIA REPAIR     Comment:  umbilical No date: INGUINAL HERNIA REPAIR; Bilateral     Comment:  inguinal No date: LAPAROSCOPIC BILATERAL SALPINGO OOPHERECTOMY 2002: SPINAL  FUSION 1961: TONSILLECTOMY AND ADENOIDECTOMY     Reproductive/Obstetrics negative OB ROS                             Anesthesia Physical Anesthesia Plan  ASA: III  Anesthesia Plan: General LMA   Post-op Pain Management:    Induction: Intravenous  PONV Risk Score and Plan: Dexamethasone, Ondansetron, Midazolam and Treatment may vary due to age or medical condition  Airway Management Planned: LMA  Additional Equipment:   Intra-op Plan:   Post-operative Plan: Extubation in OR  Informed Consent: I have reviewed the patients History and Physical, chart, labs and discussed the procedure including the risks, benefits and alternatives for the proposed anesthesia with the patient or authorized representative who has indicated his/her understanding and acceptance.     Dental Advisory Given  Plan Discussed with: Anesthesiologist, CRNA and Surgeon  Anesthesia Plan Comments: (Patient consented for risks of anesthesia including but not limited to:  - adverse reactions to medications - damage to teeth, lips or other oral mucosa - sore throat or hoarseness - Damage to heart, brain, lungs or loss of life  Patient voiced understanding.)        Anesthesia Quick Evaluation

## 2018-12-30 NOTE — Interval H&P Note (Signed)
History and Physical Interval Note:  12/30/2018 12:55 PM  Erin Petersen  has presented today for surgery, with the diagnosis of chronic cystitis.  The various methods of treatment have been discussed with the patient and family. After consideration of risks, benefits and other options for treatment, the patient has consented to  Procedure(s): CYSTOSCOPY/HYDRODISTENSION WITH MARCAINE (N/A) as a surgical intervention.  The patient's history has been reviewed, patient examined, no change in status, stable for surgery.  I have reviewed the patient's chart and labs.  Questions were answered to the patient's satisfaction.     Vonya Ohalloran A Annabell Oconnor

## 2018-12-30 NOTE — Interval H&P Note (Signed)
History and Physical Interval Note:  12/30/2018 12:55 PM  Erin Petersen  has presented today for surgery, with the diagnosis of chronic cystitis.  The various methods of treatment have been discussed with the patient and family. After consideration of risks, benefits and other options for treatment, the patient has consented to  Procedure(s): CYSTOSCOPY/HYDRODISTENSION WITH MARCAINE (N/A) as a surgical intervention.  The patient's history has been reviewed, patient examined, no change in status, stable for surgery.  I have reviewed the patient's chart and labs.  Questions were answered to the patient's satisfaction.     Anees Vanecek A Koya Hunger

## 2018-12-31 ENCOUNTER — Encounter: Payer: Self-pay | Admitting: Urology

## 2018-12-31 NOTE — Anesthesia Postprocedure Evaluation (Signed)
Anesthesia Post Note  Patient: Genevra Orne  Procedure(s) Performed: CYSTOSCOPY/HYDRODISTENSION WITH MARCAINE (N/A Bladder)  Patient location during evaluation: PACU Anesthesia Type: General Level of consciousness: awake and alert Pain management: pain level controlled Vital Signs Assessment: post-procedure vital signs reviewed and stable Respiratory status: spontaneous breathing, nonlabored ventilation, respiratory function stable and patient connected to nasal cannula oxygen Cardiovascular status: blood pressure returned to baseline and stable Postop Assessment: no apparent nausea or vomiting Anesthetic complications: no     Last Vitals:  Vitals:   12/30/18 1521 12/30/18 1609  BP:  138/65  Pulse:  74  Resp:  18  Temp: 36.5 C   SpO2:  100%    Last Pain:  Vitals:   12/30/18 1609  TempSrc:   PainSc: 2                  Precious Haws Piscitello

## 2019-01-01 ENCOUNTER — Other Ambulatory Visit: Payer: Self-pay

## 2019-01-01 ENCOUNTER — Encounter: Payer: Self-pay | Admitting: Family Medicine

## 2019-01-01 ENCOUNTER — Ambulatory Visit (INDEPENDENT_AMBULATORY_CARE_PROVIDER_SITE_OTHER): Payer: Medicare Other | Admitting: Family Medicine

## 2019-01-01 VITALS — BP 142/80 | HR 66 | Temp 98.7°F | Ht 59.0 in | Wt 161.1 lb

## 2019-01-01 DIAGNOSIS — R609 Edema, unspecified: Secondary | ICD-10-CM

## 2019-01-01 DIAGNOSIS — G4452 New daily persistent headache (NDPH): Secondary | ICD-10-CM | POA: Diagnosis not present

## 2019-01-01 DIAGNOSIS — J309 Allergic rhinitis, unspecified: Secondary | ICD-10-CM

## 2019-01-01 DIAGNOSIS — R7303 Prediabetes: Secondary | ICD-10-CM | POA: Diagnosis not present

## 2019-01-01 DIAGNOSIS — E669 Obesity, unspecified: Secondary | ICD-10-CM

## 2019-01-01 MED ORDER — FLUTICASONE PROPIONATE 50 MCG/ACT NA SUSP: 2.0000 | Freq: Every day | NASAL | 6 refills | Status: DC

## 2019-01-01 NOTE — Progress Notes (Signed)
Subjective:    Patient ID: Erin Petersen, female    DOB: [birth year], 78 y.o.   MRN: [id number]  HPI Chief Complaint  Patient presents with  . Follow-up    Discuss A1c / Headaches, possibly related to stopping Gabapentin. Pt also states that she has been having some upset stomach - denies vomiting.    Hgba1c- mildly elevated 12/06/18 was 6.3. Dietary recall shows that she eats large amount of simple carbs daily.   Daily headache and nausea- has noticed since she started gabapentin wean. Mostly over right eye, sometimes over left. Achy. Has gnawing feeling in stomach, relieved with eating. Little relief of headache with tylenol. Had negative MRI 12/13/2018. ? Related to allergies, has itchy, watery eyes. Takes Zyrtec, but hasn't taken lately.   Increased weight. Notices that weight went up 9 pounds during the day yesterday, felt bloated and mildly SOB. No leg swelling. She had eaten a homemade crust less quiche, seafood salad and lasagna. Walks daily with her friends. No SOB.     Past Medical History:  Diagnosis Date  . Arthritis   . Asthma    exercise induced asthma  . Colon polyps   . Dyspnea   . GERD (gastroesophageal reflux disease)   . Hyperlipidemia   . Hypertension   . Prediabetes   . RLS (restless legs syndrome)   . SOB (shortness of breath)   . Urinary incontinence   . Urinary tract infection    Past Surgical History:  Procedure Laterality Date  . ABDOMINAL HYSTERECTOMY  1972  . APPENDECTOMY  1958  . BACK SURGERY     lumbar fusion  . BILATERAL SALPINGOOPHORECTOMY  01/07/2018  . COLONOSCOPY WITH PROPOFOL N/A 11/11/2018   Procedure: COLONOSCOPY WITH PROPOFOL;  Surgeon: [name], MD;  Location: [facility] ENDOSCOPY;  Service: Gastroenterology;  Laterality: N/A;  . CYSTO WITH HYDRODISTENSION N/A 12/30/2018   Procedure: CYSTOSCOPY/HYDRODISTENSION WITH MARCAINE;  Surgeon: Bjorn Loser, MD;  Location: [facility] ORS;  Service: Urology;  Laterality: N/A;  . HERNIA  REPAIR     abd  . HERNIA REPAIR     umbilical  . INGUINAL HERNIA REPAIR Bilateral    inguinal  . LAPAROSCOPIC BILATERAL SALPINGO OOPHERECTOMY    . SPINAL FUSION  2002  . TONSILLECTOMY AND ADENOIDECTOMY  1961   Family History  Problem Relation Age of Onset  . Alcohol abuse Mother   . Cancer Mother   . Stroke Mother   . Alcohol abuse Father   . Arthritis Sister   . Arthritis Daughter   . COPD Daughter   . Arthritis Paternal Grandmother   . Asthma Paternal Grandmother   . COPD Paternal Grandmother    Social History   Tobacco Use  . Smoking status: Never Smoker  . Smokeless tobacco: Never Used  Substance Use Topics  . Alcohol use: Yes    Comment: rare  . Drug use: Never    Past Medical History:  Diagnosis Date  . Arthritis   . Asthma    exercise induced asthma  . Colon polyps   . Dyspnea   . GERD (gastroesophageal reflux disease)   . Hyperlipidemia   . Hypertension   . Prediabetes   . RLS (restless legs syndrome)   . SOB (shortness of breath)   . Urinary incontinence   . Urinary tract infection    Past Surgical History:  Procedure Laterality Date  . ABDOMINAL HYSTERECTOMY  1972  . APPENDECTOMY  1958  . BACK SURGERY  lumbar fusion  . BILATERAL SALPINGOOPHORECTOMY  01/07/2018  . COLONOSCOPY WITH PROPOFOL N/A 11/11/2018   Procedure: COLONOSCOPY WITH PROPOFOL;  Surgeon: [name], MD;  Location: [facility] ENDOSCOPY;  Service: Gastroenterology;  Laterality: N/A;  . CYSTO WITH HYDRODISTENSION N/A 12/30/2018   Procedure: CYSTOSCOPY/HYDRODISTENSION WITH MARCAINE;  Surgeon: Bjorn Loser, MD;  Location: [facility] ORS;  Service: Urology;  Laterality: N/A;  . HERNIA REPAIR     abd  . HERNIA REPAIR     umbilical  . INGUINAL HERNIA REPAIR Bilateral    inguinal  . LAPAROSCOPIC BILATERAL SALPINGO OOPHERECTOMY    . SPINAL FUSION  2002  . TONSILLECTOMY AND ADENOIDECTOMY  1961   Family History  Problem Relation Age of Onset  . Alcohol abuse Mother   . Cancer  Mother   . Stroke Mother   . Alcohol abuse Father   . Arthritis Sister   . Arthritis Daughter   . COPD Daughter   . Arthritis Paternal Grandmother   . Asthma Paternal Grandmother   . COPD Paternal Grandmother    Social History   Tobacco Use  . Smoking status: Never Smoker  . Smokeless tobacco: Never Used  Substance Use Topics  . Alcohol use: Yes    Comment: rare  . Drug use: Never      Review of Systems Per HPI    Objective:   Physical Exam Vitals signs reviewed.  Constitutional:      General: She is not in acute distress.    Appearance: Normal appearance. She is obese. She is not ill-appearing, toxic-appearing or diaphoretic.  HENT:     Head: Normocephalic and atraumatic.  Eyes:     Conjunctiva/sclera: Conjunctivae normal.  Cardiovascular:     Rate and Rhythm: Normal rate.  Pulmonary:     Effort: Pulmonary effort is normal.  Neurological:     Mental Status: She is alert and oriented to person, place, and time.  Psychiatric:        Mood and Affect: Mood normal.        Behavior: Behavior normal.        Thought Content: Thought content normal.        Judgment: Judgment normal.      BP (!) 142/80 (BP Location: Left Arm, Patient Position: Sitting, Cuff Size: Normal)   Pulse 66   Temp 98.7 F (37.1 C) (Temporal)   Ht 4' 11"  (1.499 m)   Wt 161 lb 1.9 oz (73.1 kg)   LMP  (LMP Unknown)   SpO2 98%   BMI 32.54 kg/m  Wt Readings from Last 3 Encounters:  01/01/19 161 lb 1.9 oz (73.1 kg)  12/30/18 158 lb 1.1 oz (71.7 kg)  12/26/18 158 lb (71.7 kg)        Assessment & Plan:  1. Allergic rhinitis, unspecified seasonality, unspecified trigger - discussed use of otc long acting antihistamines, inhaled nasal steroid - fluticasone (FLONASE) 50 MCG/ACT nasal spray; Place 2 sprays into both nostrils daily.  Dispense: 16 g; Refill: 6  2. New daily persistent headache - had recent negative MRI, ? Related to weaning of gabapentin. Will treat her allergies and if no  improvement, will consult her neurologist  3. Swelling - singular occurrence with high salt intake, encouraged increased water intake, decrease sodium intake  4. Prediabetes - reviewed her A1C and discussed dietary modifications, provided written and verbal information regarding balanced diet  5. Obesity (BMI 30.0-34.9) - see #4   Clarene Reamer, FNP-BC  [facility],  [facility]  01/01/2019 4:56 PM

## 2019-01-01 NOTE — Patient Instructions (Addendum)
Good to see you today  For headaches, let treat your allergies, I have sent fluticasone nasal spray, use at bedtime, can add Zyrtec, be in touch in 2-3 weeks if no improvement  A resource that I like is www.dietdoctor.com/diabetes/diet  Here are some guidelines to help you with meal planning -  Avoid all processed and packaged foods (bread, pasta, crackers, chips, etc) and beverages containing calories.  Avoid added sugars and excessive natural sugars.  Attention to how you feel if you consume artificial sweeteners.  Do they make you more hungry or raise your blood sugar?  With every meal and snack, aim to get 20 g of protein (3 ounces of meat, 4 ounces of fish, 3 eggs, protein powder, 1 cup [place] yogurt, 1 cup cottage cheese, etc.)  Increase fiber in the form of non-starchy vegetables.  These help you feel full with very little carbohydrates and are good for gut health.  Eat 1 serving healthy carb per meal- 1/2 cup brown rice, beans, potato, corn- pay attention to whether or not this significantly raises your blood sugar. If it does, reduce the frequency you consume these.   Eat 2-3 servings of lower sugar fruits daily.  This includes berries, apples, oranges, peaches, pears, one half banana.  Have small amounts of good fats such as avocado, nuts, olive oil, nut butters, olives.  Add a little cheese to your salads to make them tasty.

## 2019-01-08 ENCOUNTER — Encounter: Payer: Self-pay | Admitting: Family Medicine

## 2019-01-13 ENCOUNTER — Ambulatory Visit (INDEPENDENT_AMBULATORY_CARE_PROVIDER_SITE_OTHER): Payer: Medicare Other | Admitting: Urology

## 2019-01-13 ENCOUNTER — Other Ambulatory Visit: Payer: Self-pay

## 2019-01-13 ENCOUNTER — Encounter: Payer: Self-pay | Admitting: Urology

## 2019-01-13 VITALS — BP 145/68 | HR 82 | Wt 164.0 lb

## 2019-01-13 DIAGNOSIS — N3946 Mixed incontinence: Secondary | ICD-10-CM | POA: Diagnosis not present

## 2019-01-13 MED ORDER — SODIUM BICARBONATE 8.4 % IV SOLN
11.0000 mL | Freq: Once | INTRAVENOUS | Status: AC
  Administered 2019-01-13: 11 mL

## 2019-01-13 NOTE — Progress Notes (Signed)
01/13/2019 1:09 PM   Fredda Hammed 07-23-1940 [id number]  Referring provider: Elby Beck, [place] [place],  [place] [address]  Chief Complaint  Patient presents with   Post-op Follow-up    2wk    HPI: I was consulted to assess the patient's suprapubic pain. 3 or 4 times a night she wakes up with suprapubic discomfort that is quite severe. It is relieved by voiding. If she goes frequently during the day she does not have the pain unless she tries to hold it. She voids every 2 hour in somewhat of a time voiding fashion gets up to 3-4 times a night. She rarely at night if she holds it has foot on the floor syndrome is continent during the day  [place] and she said that hydroxyzine helped minimally and she failed amitriptyline and is also been given local estrogen cream.  She thinks she gets 3 or 4 bladder infections a year with frequency urethral dysuria that respond favorably antibiotic and has been worked up for microhematuria. She [place] CT [place] 02, 2018 demonstrating a right renal cyst and had a recent normalcolonoscopy for possible enteritis.  Patient has never been on urinary prophylaxis. He takes gabapentin for neuropathy and Mobic for arthritis  High grade 1 cystocele with little to no rectocele and no stress incontinence. Well supported bladder neck. Tissues looked atrophied. Bladder and introitus was minimally tender Skin: No rashes, bruises or suspicious   Patient may have interstitial cystitis and likely does. The role of a hydrodistention discussed. Patient had been given a diagnosis of interstitial cystitis I do state Work-up for hematuria normal. Has never had a hydrodistention. Usual template utilized.Rescue treatments she was aware of but I would like to do the hydrodistention first for diagnosis and hopefully for treatment and to rule out ulcer. We will call if urine culture is  positive.   Today Frequency stable.  Urine culture positive in September.  Hydrodistention recently normal with no findings in keeping with interstitial cystitis and performed December 30, 2018  The patient said the hydrodistention did not help.  She understands that the diagnosis is not crystal-clear.  She says she gets up 4 times a night and feels burning in the suprapubic area and it does sound like she likely has interstitial cystitis.  Clinically not infected today but urine sent for culture  She will do bladder instillation treatments with triple mix 2-3 times a week and see me again in 3 weeks.  Hopefully this will help alleviate symptoms   PMH: Past Medical History:  Diagnosis Date   Arthritis    Asthma    exercise induced asthma   Colon polyps    Dyspnea    GERD (gastroesophageal reflux disease)    Hyperlipidemia    Hypertension    Prediabetes    RLS (restless legs syndrome)    SOB (shortness of breath)    Urinary incontinence    Urinary tract infection     Surgical History: Past Surgical History:  Procedure Laterality Date   [place]   BACK SURGERY     lumbar fusion   BILATERAL SALPINGOOPHORECTOMY  01/07/2018   COLONOSCOPY WITH PROPOFOL N/A 11/11/2018   Procedure: COLONOSCOPY WITH PROPOFOL;  Surgeon: [name], MD;  Location: [place];  Service: Gastroenterology;  Laterality: N/A;   CYSTO WITH HYDRODISTENSION N/A 12/30/2018   Procedure: CYSTOSCOPY/HYDRODISTENSION WITH  MARCAINE;  Surgeon: Bjorn Loser, MD;  Location: [facility] ORS;  Service: Urology;  Laterality: N/A;   HERNIA REPAIR     abd   HERNIA REPAIR     umbilical   INGUINAL HERNIA REPAIR Bilateral    inguinal   LAPAROSCOPIC BILATERAL SALPINGO OOPHERECTOMY     SPINAL FUSION  2002   TONSILLECTOMY AND ADENOIDECTOMY  1961    Home Medications:  Allergies as of 01/13/2019      Reactions   Other Anaphylaxis   Bee Sting     Ciprofloxacin Hcl Rash   Mild rash after long term use   Sulfa Antibiotics Rash   Bactrim Bactrim, Cipro      Medication List       Accurate as of January 13, 2019  1:09 PM. If you have any questions, ask your nurse or doctor.        STOP taking these medications   gabapentin 300 MG capsule Commonly known as: NEURONTIN Stopped by: Reece Packer, MD     TAKE these medications   acetaminophen 650 MG CR tablet Commonly known as: TYLENOL Take 1,300 mg by mouth at bedtime.   albuterol 108 (90 Base) MCG/ACT inhaler Commonly known as: VENTOLIN HFA Inhale 2 puffs into the lungs every 4 (four) hours as needed for wheezing or shortness of breath (cough, shortness of breath or wheezing.).   amLODipine 5 MG tablet Commonly known as: NORVASC Take 1 tablet (5 mg total) by mouth daily.   aspirin 81 MG EC tablet Take 81 mg by mouth at bedtime.   Cholecalciferol 50 MCG (2000 UT) Tabs Take 2,000 Units by mouth daily.   Dexilant 60 MG capsule Generic drug: dexlansoprazole Take 1 capsule (60 mg total) by mouth daily. What changed: when to take this   diclofenac sodium 1 % Gel Commonly known as: VOLTAREN Apply 1 application topically 2 (two) times daily as needed (pain.).   dicyclomine 10 MG capsule Commonly known as: Bentyl Take 1 capsule (10 mg total) by mouth at bedtime.   EPINEPHrine 0.3 mg/0.3 mL Soaj injection Commonly known as: EPI-PEN Inject 0.3 mg into the muscle as needed for anaphylaxis.   estradiol 0.1 MG/GM vaginal cream Commonly known as: ESTRACE Place 1 Applicatorful vaginally 3 (three) times a week. What changed: when to take this   fluticasone 50 MCG/ACT nasal spray Commonly known as: FLONASE Place 2 sprays into both nostrils daily.   Fusion Plus Caps Take 1 capsule by mouth daily with breakfast.   hydrochlorothiazide 25 MG tablet Commonly known as: HYDRODIURIL Take 1 tablet (25 mg total) by mouth daily.   lidocaine 2 % jelly Commonly known  as: XYLOCAINE Apply 1 application topically as needed (pain.).   Melatonin 3 MG Tabs Take 3 mg by mouth at bedtime.   meloxicam 15 MG tablet Commonly known as: MOBIC Take 1 tablet (15 mg total) by mouth daily as needed. What changed: when to take this   polyethylene glycol 17 g packet Commonly known as: MIRALAX / GLYCOLAX Take 1 packet by mouth daily.   pregabalin 100 MG capsule Commonly known as: LYRICA Take 100 mg by mouth 2 (two) times daily.   rOPINIRole 0.5 MG tablet Commonly known as: REQUIP Take 1-3 tablets (0.5-1.5 mg total) by mouth 3 (three) times daily. Take 1 tablet am, 1 pm and 3 tablets at bedtime What changed:   when to take this  additional instructions   simvastatin 10 MG tablet Commonly known as: ZOCOR Take 1 tablet (10  mg total) by mouth daily before supper. What changed: when to take this   Vitamin B-12 3000 MCG Subl Take 3,000 mcg by mouth daily.       Allergies:  Allergies  Allergen Reactions   Other Anaphylaxis    Bee Sting    Ciprofloxacin Hcl Rash    Mild rash after long term use   Sulfa Antibiotics Rash    Bactrim Bactrim, Cipro     Family History: Family History  Problem Relation Age of Onset   Alcohol abuse Mother    Cancer Mother    Stroke Mother    Alcohol abuse Father    Arthritis Sister    Arthritis Daughter    COPD Daughter    Arthritis Paternal Grandmother    Asthma Paternal Grandmother    COPD Paternal Grandmother     Social History:  reports that she has never smoked. She has never used smokeless tobacco. She reports current alcohol use. She reports that she does not use drugs.  ROS: UROLOGY Frequent Urination?: Yes Hard to postpone urination?: Yes Burning/pain with urination?: Yes Get up at night to urinate?: Yes Leakage of urine?: No Urine stream starts and stops?: No Trouble starting stream?: No Do you have to strain to urinate?: No Blood in urine?: No Urinary tract infection?:  No Sexually transmitted disease?: No Injury to kidneys or bladder?: No Painful intercourse?: No Weak stream?: No Currently pregnant?: No Vaginal bleeding?: No Last menstrual period?: n  Gastrointestinal Nausea?: No Vomiting?: No Indigestion/heartburn?: No Diarrhea?: No Constipation?: Yes  Constitutional Fever: No Night sweats?: No Weight loss?: No Fatigue?: Yes  Skin Skin rash/lesions?: No Itching?: No  Eyes Blurred vision?: Yes Double vision?: No  Ears/Nose/Throat Sore throat?: No Sinus problems?: Yes  Hematologic/Lymphatic Swollen glands?: No Easy bruising?: No  Cardiovascular Leg swelling?: No Chest pain?: No  Respiratory Cough?: No Shortness of breath?: Yes  Endocrine Excessive thirst?: Yes  Musculoskeletal Back pain?: Yes Joint pain?: Yes  Neurological Headaches?: Yes Dizziness?: No  Psychologic Depression?: No Anxiety?: No  Physical Exam: BP (!) 145/68    Pulse 82    Wt 74.4 kg    LMP  (LMP Unknown)    BMI 33.12 kg/m     Laboratory Data: No results found for: WBC, HGB, HCT, MCV, PLT  Lab Results  Component Value Date   CREATININE 0.70 12/11/2018    No results found for: PSA  No results found for: TESTOSTERONE  No results found for: HGBA1C  Urinalysis    Component Value Date/Time   APPEARANCEUR Hazy (A) 11/18/2018 1124   GLUCOSEU Negative 11/18/2018 1124   BILIRUBINUR Negative 11/18/2018 1124   PROTEINUR Negative 11/18/2018 1124   NITRITE Positive (A) 11/18/2018 1124   LEUKOCYTESUR Trace (A) 11/18/2018 1124    Pertinent Imaging:   Assessment & Plan: As noted above  There are no diagnoses linked to this encounter.  No follow-ups on file.  Reece Packer, [facility] [address] [place], [place] [address] [phone]

## 2019-01-13 NOTE — Progress Notes (Signed)
Bladder Rescue Solution Instillation  Due to IC patient is present today for a Rescue Solution Treatment.  Patient was cleaned and prepped in a sterile fashion with betadine and lidocaine 2% jelly was instilled into the urethra.  A 14FR catheter was inserted, urine return was noted 279m, urine was yellow in color.  Instilled a solution consisting of 886mof Sodium Bicarb, 2 ml Lidocaine and 1 ml of Heparin. The catheter was then removed. Patient tolerated well, no complications were noted.   Performed by: ReVerlene MayerCMA  Follow up/ Additional Notes: Bladder rescue for 3 weeks Monday, Wed, and Friday then follow up with Dr. MaMatilde Sprang

## 2019-01-14 ENCOUNTER — Ambulatory Visit: Payer: Medicare Other | Admitting: Gastroenterology

## 2019-01-15 ENCOUNTER — Ambulatory Visit (INDEPENDENT_AMBULATORY_CARE_PROVIDER_SITE_OTHER): Payer: Medicare Other | Admitting: Physician Assistant

## 2019-01-15 ENCOUNTER — Other Ambulatory Visit: Payer: Self-pay

## 2019-01-15 ENCOUNTER — Encounter: Payer: Self-pay | Admitting: Physician Assistant

## 2019-01-15 VITALS — BP 132/73 | HR 71

## 2019-01-15 DIAGNOSIS — N3020 Other chronic cystitis without hematuria: Secondary | ICD-10-CM

## 2019-01-15 DIAGNOSIS — R300 Dysuria: Secondary | ICD-10-CM | POA: Diagnosis not present

## 2019-01-15 LAB — MICROSCOPIC EXAMINATION
Bacteria, UA: NONE SEEN
Epithelial Cells (non renal): NONE SEEN /hpf (ref 0–10)

## 2019-01-15 LAB — URINALYSIS, COMPLETE
Bilirubin, UA: NEGATIVE
Glucose, UA: NEGATIVE
Ketones, UA: NEGATIVE
Leukocytes,UA: NEGATIVE
Nitrite, UA: NEGATIVE
Protein,UA: NEGATIVE
Specific Gravity, UA: 1.015 (ref 1.005–1.030)
Urobilinogen, Ur: 0.2 mg/dL (ref 0.2–1.0)
pH, UA: 5.5 (ref 5.0–7.5)

## 2019-01-15 MED ORDER — SODIUM BICARBONATE 8.4 % IV SOLN
11.0000 mL | Freq: Once | INTRAVENOUS | Status: AC
  Administered 2019-01-15: 11 mL

## 2019-01-15 NOTE — Progress Notes (Signed)
Bladder Rescue Solution Instillation  Due to IC patient is present today for a Rescue Solution Treatment.  Patient was cleaned and prepped in a sterile fashion with betadine and lidocaine 2% jelly was instilled into the urethra.  A 14 FR catheter was inserted, urine return was noted 22m, urine was clear in color.  Instilled a solution consisting of 844mof Sodium Bicarb, 2 ml Lidocaine and 1 ml of Heparin. The catheter was then removed. Patient tolerated well, no complications were noted.   Performed by: SaCleon DewPAC SaAlva GarnetFollow up/ Additional Notes: keep appt on Friday for next treatment

## 2019-01-17 ENCOUNTER — Encounter: Payer: Self-pay | Admitting: Physician Assistant

## 2019-01-17 ENCOUNTER — Other Ambulatory Visit: Payer: Self-pay

## 2019-01-17 ENCOUNTER — Ambulatory Visit (INDEPENDENT_AMBULATORY_CARE_PROVIDER_SITE_OTHER): Payer: Medicare Other | Admitting: Physician Assistant

## 2019-01-17 VITALS — BP 133/76 | HR 62 | Ht 59.0 in | Wt 164.0 lb

## 2019-01-17 DIAGNOSIS — N3020 Other chronic cystitis without hematuria: Secondary | ICD-10-CM | POA: Diagnosis not present

## 2019-01-17 DIAGNOSIS — R339 Retention of urine, unspecified: Secondary | ICD-10-CM

## 2019-01-17 LAB — URINALYSIS, COMPLETE
Bilirubin, UA: NEGATIVE
Glucose, UA: NEGATIVE
Ketones, UA: NEGATIVE
Leukocytes,UA: NEGATIVE
Nitrite, UA: NEGATIVE
Protein,UA: NEGATIVE
Specific Gravity, UA: 1.02 (ref 1.005–1.030)
Urobilinogen, Ur: 0.2 mg/dL (ref 0.2–1.0)
pH, UA: 6.5 (ref 5.0–7.5)

## 2019-01-17 LAB — BLADDER SCAN AMB NON-IMAGING: Scan Result: 5

## 2019-01-17 LAB — MICROSCOPIC EXAMINATION
Bacteria, UA: NONE SEEN
Epithelial Cells (non renal): NONE SEEN /hpf (ref 0–10)
RBC, Urine: NONE SEEN /hpf (ref 0–2)

## 2019-01-17 MED ORDER — SODIUM BICARBONATE 8.4 % IV SOLN
11.0000 mL | Freq: Once | INTRAVENOUS | Status: AC
  Administered 2019-01-17: 11 mL

## 2019-01-17 NOTE — Progress Notes (Signed)
Bladder Rescue Solution Instillation  Due to IC patient is present today for a Rescue Solution Treatment.  Patient was cleaned and prepped in a sterile fashion with betadine and lidocaine 2% jelly was instilled into the urethra.  A 14 FR catheter was inserted, urine return was noted 450m, urine was yellow in color.  Instilled a solution consisting of 840mof Sodium Bicarb, 2 ml Lidocaine and 1 ml of Heparin. The catheter was then removed. Patient tolerated well, no complications were noted.   Performed by: SaDebroah LoopPA-C and ReVerlene MayerCMA  Follow up/ Additional Notes: Patient reports that she believes she is retaining urine. Bladder scan ordered with 50m50mNo concern for retention at this time, no treatment indicated.  Results for orders placed or performed in visit on 01/17/19  Bladder Scan (Post Void Residual) in office  Result Value Ref Range   Scan Result 5

## 2019-01-20 ENCOUNTER — Ambulatory Visit (INDEPENDENT_AMBULATORY_CARE_PROVIDER_SITE_OTHER): Payer: Medicare Other | Admitting: Physician Assistant

## 2019-01-20 ENCOUNTER — Other Ambulatory Visit: Payer: Self-pay

## 2019-01-20 VITALS — BP 127/74 | HR 67

## 2019-01-20 DIAGNOSIS — N3020 Other chronic cystitis without hematuria: Secondary | ICD-10-CM

## 2019-01-20 DIAGNOSIS — R339 Retention of urine, unspecified: Secondary | ICD-10-CM

## 2019-01-20 LAB — MICROSCOPIC EXAMINATION
Bacteria, UA: NONE SEEN
RBC, Urine: NONE SEEN /hpf (ref 0–2)

## 2019-01-20 LAB — URINALYSIS, COMPLETE
Bilirubin, UA: NEGATIVE
Glucose, UA: NEGATIVE
Ketones, UA: NEGATIVE
Leukocytes,UA: NEGATIVE
Nitrite, UA: NEGATIVE
Protein,UA: NEGATIVE
RBC, UA: NEGATIVE
Specific Gravity, UA: 1.02 (ref 1.005–1.030)
Urobilinogen, Ur: 0.2 mg/dL (ref 0.2–1.0)
pH, UA: 7 (ref 5.0–7.5)

## 2019-01-20 MED ORDER — SODIUM BICARBONATE 8.4 % IV SOLN
11.0000 mL | Freq: Once | INTRAVENOUS | Status: AC
  Administered 2019-01-20: 11 mL

## 2019-01-20 NOTE — Progress Notes (Signed)
Bladder Rescue Solution Instillation  Due to IC patient is present today for a Rescue Solution Treatment.  Patient was cleaned and prepped in a sterile fashion with betadine and lidocaine 2% jelly was instilled into the urethra.  A 14 FR catheter was inserted, urine return was noted 51m, urine was clear yellow in color.  Instilled a solution consisting of 821mof Sodium Bicarb, 2 ml Lidocaine and 1 ml of Heparin. The catheter was then removed. Patient tolerated well, no complications were noted.   Performed by: SaDebroah LoopPA-C and SaFonnie JarvisCMA  Follow up/ Additional Notes: Patient reports improvement in nocturia, only x1-2 over the past couple of days (x5-6 prior to treatments). Patient to return Wednesday for next treatment.

## 2019-01-21 ENCOUNTER — Ambulatory Visit
Admission: RE | Admit: 2019-01-21 | Discharge: 2019-01-21 | Disposition: A | Discharge status: Home / Self Care | Payer: Medicare Other | Source: Ambulatory Visit | Attending: Family Medicine | Admitting: Family Medicine

## 2019-01-21 ENCOUNTER — Encounter: Payer: Self-pay | Admitting: Radiology

## 2019-01-21 DIAGNOSIS — E2839 Other primary ovarian failure: Secondary | ICD-10-CM

## 2019-01-21 DIAGNOSIS — Z1231 Encounter for screening mammogram for malignant neoplasm of breast: Secondary | ICD-10-CM

## 2019-01-22 ENCOUNTER — Encounter: Payer: Self-pay | Admitting: Family Medicine

## 2019-01-22 ENCOUNTER — Encounter: Payer: Self-pay | Admitting: Urology

## 2019-01-22 ENCOUNTER — Ambulatory Visit (INDEPENDENT_AMBULATORY_CARE_PROVIDER_SITE_OTHER): Payer: Medicare Other | Admitting: Urology

## 2019-01-22 ENCOUNTER — Other Ambulatory Visit: Payer: Self-pay

## 2019-01-22 VITALS — BP 146/74 | HR 73 | Ht 59.0 in | Wt 164.0 lb

## 2019-01-22 DIAGNOSIS — R300 Dysuria: Secondary | ICD-10-CM

## 2019-01-22 DIAGNOSIS — R339 Retention of urine, unspecified: Secondary | ICD-10-CM

## 2019-01-22 DIAGNOSIS — N3020 Other chronic cystitis without hematuria: Secondary | ICD-10-CM | POA: Diagnosis not present

## 2019-01-22 LAB — URINALYSIS, COMPLETE
Bilirubin, UA: NEGATIVE
Glucose, UA: NEGATIVE
Ketones, UA: NEGATIVE
Leukocytes,UA: NEGATIVE
Nitrite, UA: NEGATIVE
Protein,UA: NEGATIVE
Specific Gravity, UA: 1.015 (ref 1.005–1.030)
Urobilinogen, Ur: 0.2 mg/dL (ref 0.2–1.0)
pH, UA: 5.5 (ref 5.0–7.5)

## 2019-01-22 LAB — MICROSCOPIC EXAMINATION
Epithelial Cells (non renal): NONE SEEN /hpf (ref 0–10)
RBC, Urine: NONE SEEN /hpf (ref 0–2)

## 2019-01-22 MED ORDER — SODIUM BICARBONATE 8.4 % IV SOLN
11.0000 mL | Freq: Once | INTRAVENOUS | Status: AC
  Administered 2019-01-22: 11 mL

## 2019-01-22 MED ORDER — LIDOCAINE HCL URETHRAL/MUCOSAL 2 % EX GEL
1.0000 "application " | Freq: Once | CUTANEOUS | Status: AC
  Administered 2019-01-22: 1 via URETHRAL

## 2019-01-22 NOTE — Telephone Encounter (Signed)
Sent a message back to patient asking is she or anyone in her group are having any symptoms.   Please advise, thanks.

## 2019-01-22 NOTE — Progress Notes (Signed)
Bladder Rescue Solution Instillation  Due to IC patient is present today for a Rescue Solution Treatment.  Patient was cleaned and prepped in a sterile fashion with betadine and lidocaine 2% jelly was instilled into the urethra.  A 14 FR catheter was inserted, urine return was noted 50 ml, urine was yellow in color.  Instilled a solution consisting of 24m of Sodium Bicarb, 2 ml Lidocaine and 1 ml of Heparin. The catheter was then removed. Patient tolerated well, no complications were noted.   Performed by: SZara Council PA and OGordy Clement COakley  Follow up/ Additional Notes: RTC Monday for next rescue solution

## 2019-01-27 ENCOUNTER — Encounter: Payer: Self-pay | Admitting: Physician Assistant

## 2019-01-27 ENCOUNTER — Other Ambulatory Visit: Payer: Self-pay

## 2019-01-27 ENCOUNTER — Ambulatory Visit (INDEPENDENT_AMBULATORY_CARE_PROVIDER_SITE_OTHER): Payer: Medicare Other | Admitting: Physician Assistant

## 2019-01-27 VITALS — BP 123/73 | HR 70

## 2019-01-27 DIAGNOSIS — R300 Dysuria: Secondary | ICD-10-CM

## 2019-01-27 DIAGNOSIS — N3020 Other chronic cystitis without hematuria: Secondary | ICD-10-CM

## 2019-01-27 LAB — URINALYSIS, COMPLETE
Bilirubin, UA: NEGATIVE
Glucose, UA: NEGATIVE
Ketones, UA: NEGATIVE
Leukocytes,UA: NEGATIVE
Nitrite, UA: NEGATIVE
Protein,UA: NEGATIVE
RBC, UA: NEGATIVE
Specific Gravity, UA: 1.02 (ref 1.005–1.030)
Urobilinogen, Ur: 0.2 mg/dL (ref 0.2–1.0)
pH, UA: 7 (ref 5.0–7.5)

## 2019-01-27 LAB — MICROSCOPIC EXAMINATION: RBC, Urine: NONE SEEN /hpf (ref 0–2)

## 2019-01-27 MED ORDER — SODIUM BICARBONATE 8.4 % IV SOLN
11.0000 mL | Freq: Once | INTRAVENOUS | Status: AC
  Administered 2019-01-27: 11 mL

## 2019-01-27 NOTE — Progress Notes (Signed)
Bladder Rescue Solution Instillation  Due to bladder pain patient is present today for a Rescue Solution Treatment.  Patient was cleaned and prepped in a sterile fashion with betadine and lidocaine 2% jelly was instilled into the urethra.  A 14 FR catheter was inserted, urine return was noted 9m, urine was yellow in color.  Instilled a solution consisting of 842mof Sodium Bicarb, 2 ml Lidocaine and 1 ml of Heparin. The catheter was then removed. Patient tolerated well, no complications were noted.   Performed by: SaDebroah LoopPA-C and ReVerlene MayerCMA  Follow up/ Additional Notes: Patient reports limited to no therapeutic benefit from the last 5 treatments. She is curious about other treatment options. I spoke with Dr. MaMatilde Sprangwho recommends Elmiron and/or [facility]. Patient is hesitant to start Elmiron and would prefer to defer this. Referral placed today. Patient to follow up in 2 days for next rescue solution, will complete one more week of treatments to fully evaluate for therapeutic benefit.

## 2019-01-29 ENCOUNTER — Ambulatory Visit: Payer: Medicare Other | Admitting: Physician Assistant

## 2019-01-29 ENCOUNTER — Telehealth: Payer: Self-pay | Admitting: Physician Assistant

## 2019-01-29 NOTE — Telephone Encounter (Signed)
Pt LMOM and states that the treatments are not working for her and she wants to know if she should come in for her appt today. Please advise.

## 2019-01-29 NOTE — Telephone Encounter (Signed)
Sam spoke to patient and she has been cancelled.

## 2019-01-31 ENCOUNTER — Ambulatory Visit: Payer: Medicare Other | Admitting: Physician Assistant

## 2019-01-31 DIAGNOSIS — J45909 Unspecified asthma, uncomplicated: Secondary | ICD-10-CM | POA: Insufficient documentation

## 2019-02-03 ENCOUNTER — Ambulatory Visit: Payer: Medicare Other | Admitting: Physician Assistant

## 2019-02-10 ENCOUNTER — Ambulatory Visit (INDEPENDENT_AMBULATORY_CARE_PROVIDER_SITE_OTHER): Payer: Medicare Other | Admitting: Urology

## 2019-02-10 ENCOUNTER — Encounter: Payer: Self-pay | Admitting: Urology

## 2019-02-10 ENCOUNTER — Other Ambulatory Visit: Payer: Self-pay

## 2019-02-10 VITALS — BP 138/68 | HR 82 | Ht 59.0 in | Wt 164.0 lb

## 2019-02-10 DIAGNOSIS — N3020 Other chronic cystitis without hematuria: Secondary | ICD-10-CM | POA: Diagnosis not present

## 2019-02-10 MED ORDER — NITROFURANTOIN MONOHYD MACRO 100 MG PO CAPS: 100.0000 mg | ORAL_CAPSULE | Freq: Two times a day (BID) | ORAL | 11 refills | Status: DC

## 2019-02-10 MED ORDER — CEPHALEXIN 500 MG PO CAPS: 500.0000 mg | ORAL_CAPSULE | Freq: Four times a day (QID) | ORAL | 0 refills | Status: DC

## 2019-02-10 NOTE — Progress Notes (Signed)
02/10/2019 1:37 PM   Erin Petersen [birth year] [id number]  Referring provider: Elby Beck, [place],  [place] [address]  Chief Complaint  Patient presents with  . Follow-up    HPI: I was consulted to assess the patient's suprapubic pain.  3 or 4 times a night she wakes up with suprapubic discomfort that is quite severe.  It is relieved by voiding.  If she goes frequently during the day she does not have the pain unless she tries to hold it.  She voids every 2 hour in somewhat of a time voiding fashion gets up to 3-4 times a night.  She rarely at night if she holds it has foot on the floor syndrome is continent during the day  [place] and she said that hydroxyzine helped minimally and she failed amitriptyline and is also been given local estrogen cream.  She thinks she gets 3 or 4 bladder infections a year with frequency urethral dysuria that respond favorably antibiotic and has been worked up for microhematuria.  She [place] CT [place] 02, 2018 demonstrating a right renal cyst and had a recent normal colonoscopy for possible enteritis.  Patient has never been on urinary prophylaxis.  He takes gabapentin for neuropathy and Mobic for arthritis.  Today Patient was diagnosed with MRSA cystitis out of state.  Normal work-up for hematuria.  Negative hydrodistention.  She had bladder rescue treatments.  Last September she had one positive urine culture.  She will get up 4 times a night due to suprapubic burning.  Patient remains symptomatic but she thinks she might have an infection.  Urine is foul-smelling.  Little bit more burning than usual.  Modifying factors: There are no other modifying factors  Associated signs and symptoms: There are no other associated signs and symptoms Aggravating and relieving factors: There are no other aggravating or relieving factors Severity: Moderate Duration:  Persistent     PMH: Past Medical History:  Diagnosis Date  . Arthritis   . Asthma    exercise induced asthma  . Colon polyps   . Dyspnea   . GERD (gastroesophageal reflux disease)   . Hyperlipidemia   . Hypertension   . Prediabetes   . RLS (restless legs syndrome)   . SOB (shortness of breath)   . Urinary incontinence   . Urinary tract infection     Surgical History: Past Surgical History:  Procedure Laterality Date  . ABDOMINAL HYSTERECTOMY  1972  . APPENDECTOMY  1958  . BACK SURGERY     lumbar fusion  . BILATERAL SALPINGOOPHORECTOMY  01/07/2018  . COLONOSCOPY WITH PROPOFOL N/A 11/11/2018   Procedure: COLONOSCOPY WITH PROPOFOL;  Surgeon: [name], MD;  Location: [facility] ENDOSCOPY;  Service: Gastroenterology;  Laterality: N/A;  . CYSTO WITH HYDRODISTENSION N/A 12/30/2018   Procedure: CYSTOSCOPY/HYDRODISTENSION WITH MARCAINE;  Surgeon: Bjorn Loser, MD;  Location: [facility] ORS;  Service: Urology;  Laterality: N/A;  . HERNIA REPAIR     abd  . HERNIA REPAIR     umbilical  . INGUINAL HERNIA REPAIR Bilateral    inguinal  . LAPAROSCOPIC BILATERAL SALPINGO OOPHERECTOMY    . SPINAL FUSION  2002  . TONSILLECTOMY AND ADENOIDECTOMY  1961    Home Medications:  Allergies as of 02/10/2019      Reactions   Other Anaphylaxis   Bee Sting    Ciprofloxacin Hcl Rash   Mild rash after long term use  Sulfa Antibiotics Rash   Bactrim Bactrim, Cipro      Medication List       Accurate as of February 10, 2019  1:37 PM. If you have any questions, ask your nurse or doctor.        acetaminophen 650 MG CR tablet Commonly known as: TYLENOL Take 1,300 mg by mouth at bedtime.   albuterol 108 (90 Base) MCG/ACT inhaler Commonly known as: VENTOLIN HFA Inhale 2 puffs into the lungs every 4 (four) hours as needed for wheezing or shortness of breath (cough, shortness of breath or wheezing.).   amLODipine 5 MG tablet Commonly known as: NORVASC Take 1 tablet (5 mg total) by  mouth daily.   aspirin 81 MG EC tablet Take 81 mg by mouth at bedtime.   Cholecalciferol 50 MCG (2000 UT) Tabs Take 2,000 Units by mouth daily.   Dexilant 60 MG capsule Generic drug: dexlansoprazole Take 1 capsule (60 mg total) by mouth daily. What changed: when to take this   diclofenac sodium 1 % Gel Commonly known as: VOLTAREN Apply 1 application topically 2 (two) times daily as needed (pain.).   dicyclomine 10 MG capsule Commonly known as: Bentyl Take 1 capsule (10 mg total) by mouth at bedtime.   EPINEPHrine 0.3 mg/0.3 mL Soaj injection Commonly known as: EPI-PEN Inject 0.3 mg into the muscle as needed for anaphylaxis.   estradiol 0.1 MG/GM vaginal cream Commonly known as: ESTRACE Place 1 Applicatorful vaginally 3 (three) times a week. What changed: when to take this   fluticasone 44 MCG/ACT inhaler Commonly known as: FLOVENT HFA Inhale into the lungs.   fluticasone 50 MCG/ACT nasal spray Commonly known as: FLONASE Place 2 sprays into both nostrils daily.   Fusion Plus Caps Take 1 capsule by mouth daily with breakfast.   hydrochlorothiazide 25 MG tablet Commonly known as: HYDRODIURIL Take 1 tablet (25 mg total) by mouth daily.   lidocaine 2 % jelly Commonly known as: XYLOCAINE Apply 1 application topically as needed (pain.).   Melatonin 3 MG Tabs Take 3 mg by mouth at bedtime.   meloxicam 15 MG tablet Commonly known as: MOBIC Take 1 tablet (15 mg total) by mouth daily as needed. What changed: when to take this   montelukast 10 MG tablet Commonly known as: SINGULAIR Take by mouth.   polyethylene glycol 17 g packet Commonly known as: MIRALAX / GLYCOLAX Take 1 packet by mouth daily.   pregabalin 100 MG capsule Commonly known as: LYRICA Take 100 mg by mouth 2 (two) times daily.   rOPINIRole 0.5 MG tablet Commonly known as: REQUIP Take 1-3 tablets (0.5-1.5 mg total) by mouth 3 (three) times daily. Take 1 tablet am, 1 pm and 3 tablets at  bedtime What changed:   when to take this  additional instructions   simvastatin 10 MG tablet Commonly known as: ZOCOR Take 1 tablet (10 mg total) by mouth daily before supper. What changed: when to take this   Vitamin B-12 3000 MCG Subl Take 3,000 mcg by mouth daily.       Allergies:  Allergies  Allergen Reactions  . Other Anaphylaxis    Bee Sting   . Ciprofloxacin Hcl Rash    Mild rash after long term use  . Sulfa Antibiotics Rash    Bactrim Bactrim, Cipro     Family History: Family History  Problem Relation Age of Onset  . Alcohol abuse Mother   . Cancer Mother   . Stroke Mother   . Alcohol  abuse Father   . Arthritis Sister   . Arthritis Daughter   . COPD Daughter   . Arthritis Paternal Grandmother   . Asthma Paternal Grandmother   . COPD Paternal Grandmother   . Breast cancer Paternal Aunt     Social History:  reports that she has never smoked. She has never used smokeless tobacco. She reports current alcohol use. She reports that she does not use drugs.  ROS: UROLOGY Frequent Urination?: Yes Hard to postpone urination?: Yes Burning/pain with urination?: Yes Get up at night to urinate?: Yes Leakage of urine?: No Urine stream starts and stops?: No Trouble starting stream?: No Do you have to strain to urinate?: No Blood in urine?: No Urinary tract infection?: No Sexually transmitted disease?: No Injury to kidneys or bladder?: No Painful intercourse?: No Weak stream?: No Currently pregnant?: No Vaginal bleeding?: No Last menstrual period?: n  Gastrointestinal Nausea?: No Vomiting?: No Indigestion/heartburn?: No Diarrhea?: No Constipation?: No  Constitutional Fever: No Night sweats?: No Weight loss?: No Fatigue?: No  Skin Skin rash/lesions?: No Itching?: No  Eyes Blurred vision?: No Double vision?: No  Ears/Nose/Throat Sore throat?: No Sinus problems?: No  Hematologic/Lymphatic Swollen glands?: No Easy bruising?:  No  Cardiovascular Leg swelling?: No Chest pain?: No  Respiratory Cough?: No Shortness of breath?: No  Endocrine Excessive thirst?: No  Musculoskeletal Back pain?: No Joint pain?: No  Neurological Headaches?: No Dizziness?: No  Psychologic Depression?: No Anxiety?: No  Physical Exam: BP 138/68   Pulse 82   Ht 4' 11"  (1.499 m)   Wt 164 lb (74.4 kg)   LMP  (LMP Unknown)   BMI 33.12 kg/m     Laboratory Data: No results found for: WBC, HGB, HCT, MCV, PLT  Lab Results  Component Value Date   CREATININE 0.70 12/11/2018    No results found for: PSA  No results found for: TESTOSTERONE  No results found for: HGBA1C  Urinalysis    Component Value Date/Time   APPEARANCEUR Clear 01/27/2019 1105   GLUCOSEU Negative 01/27/2019 1105   BILIRUBINUR Negative 01/27/2019 1105   PROTEINUR Negative 01/27/2019 1105   NITRITE Negative 01/27/2019 1105   LEUKOCYTESUR Negative 01/27/2019 1105    Pertinent Imaging:   Assessment & Plan: Patient urine sent for culture.  She might have a UTI.  Keflex sent for 1 week followed by daily Macrodantin.  Patient has already has appointment see Dr. Amalia Hailey in February and she will see me as necessary or in a year on or off prophylaxis.  He thinks this is her second recent infection and she did not respond to the installation  1. Chronic cystitis  - Urinalysis, Complete   No follow-ups on file.  Reece Packer, MD  [place] [address], [place], [place] [address] [phone]

## 2019-02-11 LAB — URINALYSIS, COMPLETE
Bilirubin, UA: NEGATIVE
Glucose, UA: NEGATIVE
Ketones, UA: NEGATIVE
Leukocytes,UA: NEGATIVE
Nitrite, UA: POSITIVE — AB
Protein,UA: NEGATIVE
RBC, UA: NEGATIVE
Specific Gravity, UA: 1.02 (ref 1.005–1.030)
Urobilinogen, Ur: 0.2 mg/dL (ref 0.2–1.0)
pH, UA: 6 (ref 5.0–7.5)

## 2019-02-11 LAB — MICROSCOPIC EXAMINATION
Epithelial Cells (non renal): NONE SEEN /hpf (ref 0–10)
RBC, Urine: NONE SEEN /hpf (ref 0–2)

## 2019-02-12 ENCOUNTER — Encounter: Payer: Self-pay | Admitting: Family Medicine

## 2019-02-12 ENCOUNTER — Other Ambulatory Visit: Payer: Self-pay | Admitting: Physician Assistant

## 2019-02-12 DIAGNOSIS — M810 Age-related osteoporosis without current pathological fracture: Secondary | ICD-10-CM | POA: Insufficient documentation

## 2019-02-12 LAB — CULTURE, URINE COMPREHENSIVE

## 2019-02-12 MED ORDER — NITROFURANTOIN MONOHYD MACRO 100 MG PO CAPS: 100.0000 mg | ORAL_CAPSULE | Freq: Every day | ORAL | 11 refills | Status: DC

## 2019-02-13 ENCOUNTER — Other Ambulatory Visit: Payer: Self-pay | Admitting: *Deleted

## 2019-02-13 DIAGNOSIS — R300 Dysuria: Secondary | ICD-10-CM

## 2019-02-13 MED ORDER — CEFDINIR 300 MG PO CAPS: 300.0000 mg | ORAL_CAPSULE | Freq: Two times a day (BID) | ORAL | 0 refills | Status: AC

## 2019-02-13 NOTE — Telephone Encounter (Signed)
patient stop the Keflex due to resistance. Have her take Omnicef 300 mg twice a day for 7 days and then start to daily Macrodantin as prescribed previously  . Rx sent

## 2019-02-18 ENCOUNTER — Other Ambulatory Visit: Payer: Self-pay | Admitting: Gastroenterology

## 2019-02-18 DIAGNOSIS — G8929 Other chronic pain: Secondary | ICD-10-CM

## 2019-02-18 DIAGNOSIS — R1031 Right lower quadrant pain: Secondary | ICD-10-CM

## 2019-02-18 NOTE — Telephone Encounter (Signed)
Medication has been refilled and sent to pharmacy

## 2019-03-04 ENCOUNTER — Encounter: Payer: Self-pay | Admitting: Family Medicine

## 2019-03-06 ENCOUNTER — Telehealth: Payer: Self-pay

## 2019-03-06 NOTE — Telephone Encounter (Signed)
Pt changed insurance to [facility] for 2021.  [employer] benefits resubmitted.

## 2019-03-19 ENCOUNTER — Telehealth: Payer: Self-pay

## 2019-03-19 NOTE — Telephone Encounter (Signed)
Discussed [employer] benefits w/pt.  Pt would owe approx. $265.  Pt understands and agrees.

## 2019-04-03 ENCOUNTER — Ambulatory Visit: Payer: Medicare Other

## 2019-04-03 DIAGNOSIS — R102 Pelvic and perineal pain: Secondary | ICD-10-CM | POA: Insufficient documentation

## 2019-04-03 DIAGNOSIS — N9489 Other specified conditions associated with female genital organs and menstrual cycle: Secondary | ICD-10-CM | POA: Insufficient documentation

## 2019-04-03 DIAGNOSIS — G588 Other specified mononeuropathies: Secondary | ICD-10-CM | POA: Insufficient documentation

## 2019-04-03 DIAGNOSIS — N3010 Interstitial cystitis (chronic) without hematuria: Secondary | ICD-10-CM | POA: Diagnosis not present

## 2019-04-04 ENCOUNTER — Encounter: Payer: Self-pay | Admitting: Family Medicine

## 2019-04-04 DIAGNOSIS — Z9989 Dependence on other enabling machines and devices: Secondary | ICD-10-CM | POA: Diagnosis not present

## 2019-04-04 DIAGNOSIS — G2581 Restless legs syndrome: Secondary | ICD-10-CM | POA: Diagnosis not present

## 2019-04-04 DIAGNOSIS — G4733 Obstructive sleep apnea (adult) (pediatric): Secondary | ICD-10-CM | POA: Diagnosis not present

## 2019-04-04 DIAGNOSIS — G629 Polyneuropathy, unspecified: Secondary | ICD-10-CM | POA: Diagnosis not present

## 2019-04-08 ENCOUNTER — Ambulatory Visit: Payer: Medicare Other | Admitting: Gastroenterology

## 2019-04-08 ENCOUNTER — Other Ambulatory Visit: Payer: Self-pay

## 2019-04-08 ENCOUNTER — Encounter: Payer: Self-pay | Admitting: Gastroenterology

## 2019-04-08 VITALS — BP 133/73 | HR 79 | Temp 98.4°F | Wt 164.0 lb

## 2019-04-08 DIAGNOSIS — R1031 Right lower quadrant pain: Secondary | ICD-10-CM | POA: Diagnosis not present

## 2019-04-08 DIAGNOSIS — K5909 Other constipation: Secondary | ICD-10-CM | POA: Diagnosis not present

## 2019-04-08 DIAGNOSIS — G8929 Other chronic pain: Secondary | ICD-10-CM | POA: Diagnosis not present

## 2019-04-08 DIAGNOSIS — K219 Gastro-esophageal reflux disease without esophagitis: Secondary | ICD-10-CM | POA: Diagnosis not present

## 2019-04-08 DIAGNOSIS — R718 Other abnormality of red blood cells: Secondary | ICD-10-CM

## 2019-04-08 MED ORDER — DEXILANT 60 MG PO CPDR: 60.0000 mg | DELAYED_RELEASE_CAPSULE | Freq: Every day | ORAL | 0 refills | Status: DC

## 2019-04-08 MED ORDER — DEXILANT 60 MG PO CPDR: 60.0000 mg | DELAYED_RELEASE_CAPSULE | Freq: Every day | ORAL | 3 refills | Status: DC

## 2019-04-08 NOTE — Progress Notes (Signed)
d 

## 2019-04-08 NOTE — Progress Notes (Signed)
[name], MD [address]  [place]  [place], [place] [address]  Main: [phone]  Fax: [phone]    Gastroenterology Consultation  Referring Provider:     Elby Beck, FNP Primary Care Physician:  Elby Beck, FNP Primary Gastroenterologist:  Dr. [name] Reason for Consultation: Nausea, regurgitation        HPI:   Erin Petersen is a 79 y.o. female referred by Dr. Carlean Purl, Dalbert Batman, FNP  for consultation & management of right lower quadrant pain.  Patient reports having approximately 4 years history of right lower quadrant pain that has progressed over years.  She recently moved from [place] to live with her daughter in 08/2018.  Over the last 4 years, she was evaluated by general surgery and was found to have inguinal hernias which were thought to have caused the pain, underwent hernia repair.  The pain persisted, later evaluated by OB/GYN for ovarian cyst and underwent bilateral oophorectomy.  The pain still persisted.  She then saw urology for possible bladder etiology as she was having urinary urgency nocturnally.  She underwent CT abdomen and pelvis in [place] and was found to have thickening of the terminal ileum, mesenteric edema as well as fluid in the cul-de-sac.  Therefore, she is referred to GI for further evaluation and rule out Crohn's.  Patient reports that the pain has become progressive in last 1 year, worse lying on right side, wakes her up from sleep.  Patient has been taking Advil at night in addition to meloxicam.  She also describes fatigue, large joint arthralgias.  She never smoked in her life.  Does not drink alcohol.  The severity of pain has significantly impacted her physical activity Patient denies having exposure to TB.  She started on amitriptyline, meloxicam and hydroxyzine for her lower abdominal pain.  Patient also reports localized patchy area of burning and tingling in her left lateral upper thigh  Follow-up  visit in 12/03/2018  Patient underwent colonoscopy to evaluate for abnormal CT of the terminal ileum.  It was unremarkable including TI biopsies.  There was no evidence of inflammatory bowel disease.  I empirically started her on Bentyl at bedtime which has significantly improved right lower quadrant discomfort.  She does not wake up during sleep due to severe pain.  She has chronic cystitis for which she is currently being followed by urology.  Patient reports that she has been taking Dexilant 60 mg for chronic GERD.  She does report regurgitation of food, known small hiatal hernia.  When she tried to stop Dexilant, she developed severe reflux symptoms in 3 days after stopping the medication. She used to take Nexium for several years which stopped working.  Therefore, switched to Cobre.  She reports having an upper endoscopy and possibly a pH test about 20 years ago.  She denies dysphagia.  Follow-up visit 04/08/2019 Patient weaned off Dexilant from 30 mg to Pepcid as needed since last visit.  She noticed recurrence of regurgitation as well as severe nausea when she decreased Dexilant to 5m. Her symptoms got worse when she was completely off Dexilant.  She is here to discuss about further management.  Patient also gained about 7 pounds since last visit.  She is also suffering from constipation since increasing gabapentin as well as on diazepam for her interstitial nephritis.  She takes MiraLAX as needed.  She is also dealing with flareup of plantar fascitis which is limiting her physical activity.  Patient drinks decaffeinated  coffee daily.  She does eat chocolate regularly. Patient has followed up with urologist for second opinion for interstitial cystitis at [facility] who started her on Elmiron, hydroxyzine and amitriptyline.  Patient is not taking amitriptyline.  She does not smoke cigarettes are drink alcohol regularly  NSAIDs: Advil and meloxicam  daily  Antiplts/Anticoagulants/Anti thrombotics: None  GI Procedures: Colonoscopy in 2018, reportedly normal  Colonoscopy 11/11/2018 - The examined portion of the ileum was normal. Biopsied. - Four 2 to 5 mm polyps in the descending colon and in the ascending colon, removed with a cold snare. Resected and retrieved. - The distal rectum and anal verge are normal on retroflexion view. - Normal mucosa in the entire examined colon.  DIAGNOSIS:  A. TERMINAL ILEUM; COLD BIOPSY:  - ILEAL MUCOSA WITH PROMINENT INTRAMUCOSAL LYMPHOID AGGREGATES.  - NEGATIVE FOR ACTIVE INFLAMMATION, DYSPLASIA, AND MALIGNANCY.   B. COLON POLYP X2, ASCENDING; COLD SNARE:  - TUBULAR ADENOMA, TWO FRAGMENTS.  - NEGATIVE FOR HIGH-GRADE DYSPLASIA AND MALIGNANCY.   C. COLON POLYP X2, DESCENDING; COLD SNARE:  - TUBULAR ADENOMA, ONE FRAGMENT.  - POLYPOID BENIGN COLONIC MUCOSA, ONE FRAGMENT.  - NEGATIVE FOR HIGH-GRADE DYSPLASIA AND MALIGNANCY.  Past Medical History:  Diagnosis Date  . Arthritis   . Asthma    exercise induced asthma  . Colon polyps   . Dyspnea   . GERD (gastroesophageal reflux disease)   . Hyperlipidemia   . Hypertension   . Prediabetes   . RLS (restless legs syndrome)   . SOB (shortness of breath)   . Urinary incontinence   . Urinary tract infection     Past Surgical History:  Procedure Laterality Date  . ABDOMINAL HYSTERECTOMY  1972  . APPENDECTOMY  1958  . BACK SURGERY     lumbar fusion  . BILATERAL SALPINGOOPHORECTOMY  01/07/2018  . COLONOSCOPY WITH PROPOFOL N/A 11/11/2018   Procedure: COLONOSCOPY WITH PROPOFOL;  Surgeon: [name], MD;  Location: [facility] ENDOSCOPY;  Service: Gastroenterology;  Laterality: N/A;  . CYSTO WITH HYDRODISTENSION N/A 12/30/2018   Procedure: CYSTOSCOPY/HYDRODISTENSION WITH MARCAINE;  Surgeon: Bjorn Loser, MD;  Location: [facility] ORS;  Service: Urology;  Laterality: N/A;  . HERNIA REPAIR     abd  . HERNIA REPAIR     umbilical  . INGUINAL HERNIA  REPAIR Bilateral    inguinal  . LAPAROSCOPIC BILATERAL SALPINGO OOPHERECTOMY    . SPINAL FUSION  2002  . TONSILLECTOMY AND ADENOIDECTOMY  1961    Current Outpatient Medications:  .  acetaminophen (TYLENOL) 650 MG CR tablet, Take 1,300 mg by mouth at bedtime., Disp: , Rfl:  .  albuterol (VENTOLIN HFA) 108 (90 Base) MCG/ACT inhaler, Inhale 2 puffs into the lungs every 4 (four) hours as needed for wheezing or shortness of breath (cough, shortness of breath or wheezing.)., Disp: 16 g, Rfl: 1 .  amLODipine (NORVASC) 5 MG tablet, Take 1 tablet (5 mg total) by mouth daily., Disp: 90 tablet, Rfl: 3 .  aspirin 81 MG EC tablet, Take 81 mg by mouth at bedtime. , Disp: , Rfl:  .  Coenzyme Q10 100 MG capsule, Take by mouth., Disp: , Rfl:  .  Cyanocobalamin (VITAMIN B-12) 3000 MCG SUBL, Take 3,000 mcg by mouth daily., Disp: , Rfl:  .  diclofenac sodium (VOLTAREN) 1 % GEL, Apply 1 application topically 2 (two) times daily as needed (pain.). , Disp: , Rfl:  .  dicyclomine (BENTYL) 10 MG capsule, TAKE 1 CAPSULE BY MOUTH AT  BEDTIME, Disp:  90 capsule, Rfl: 0 .  EPINEPHrine 0.3 mg/0.3 mL IJ SOAJ injection, Inject 0.3 mg into the muscle as needed for anaphylaxis. , Disp: , Rfl:  .  estradiol (ESTRACE) 0.1 MG/GM vaginal cream, Place 1 Applicatorful vaginally 3 (three) times a week. (Patient taking differently: Place 1 Applicatorful vaginally at bedtime. ), Disp: 42.5 g, Rfl: 3 .  fluticasone (FLONASE) 50 MCG/ACT nasal spray, Place 2 sprays into both nostrils daily., Disp: 16 g, Rfl: 6 .  fluticasone (FLOVENT HFA) 44 MCG/ACT inhaler, Inhale into the lungs., Disp: , Rfl:  .  hydrochlorothiazide (HYDRODIURIL) 25 MG tablet, Take 1 tablet (25 mg total) by mouth daily., Disp: 90 tablet, Rfl: 1 .  hydrOXYzine (ATARAX/VISTARIL) 25 MG tablet, 1po  one hour before sleep, Disp: , Rfl:  .  lidocaine (XYLOCAINE) 2 % jelly, Apply 1 application topically as needed (pain.)., Disp: , Rfl:  .  lidocaine (XYLOCAINE) 5 % ointment, ,  Disp: , Rfl:  .  loratadine (CLARITIN) 10 MG tablet, Take by mouth., Disp: , Rfl:  .  Melatonin 3 MG TABS, Take 3 mg by mouth at bedtime., Disp: , Rfl:  .  meloxicam (MOBIC) 15 MG tablet, Take 1 tablet (15 mg total) by mouth daily as needed. (Patient taking differently: Take 15 mg by mouth daily. ), Disp: 90 tablet, Rfl: 3 .  Meth-Hyo-M Bl-Na Phos-Ph Sal (URIBEL) 118 MG CAPS, 1 po q6 hr prn burning, Disp: , Rfl:  .  pentosan polysulfate (ELMIRON) 100 MG capsule, Take by mouth., Disp: , Rfl:  .  polyethylene glycol (MIRALAX / GLYCOLAX) 17 g packet, Take 1 packet by mouth daily. , Disp: , Rfl:  .  pregabalin (LYRICA) 100 MG capsule, Take 150 mg by mouth 2 (two) times daily. , Disp: , Rfl:  .  rOPINIRole (REQUIP) 0.5 MG tablet, Take 1-3 tablets (0.5-1.5 mg total) by mouth 3 (three) times daily. Take 1 tablet am, 1 pm and 3 tablets at bedtime (Patient taking differently: Take 0.5-1.5 mg by mouth See admin instructions. Take 1 tablet (0.5 mg) by mouth in the morning, 1 tablet (0.5 mg) by mouth in the afternoon, & take 3 tablets (1.5 mg) by mouth at bedtime.), Disp: 450 tablet, Rfl: 3 .  simvastatin (ZOCOR) 10 MG tablet, Take 1 tablet (10 mg total) by mouth daily before supper. (Patient taking differently: Take 10 mg by mouth at bedtime. ), Disp: 90 tablet, Rfl: 3 .  amitriptyline (ELAVIL) 25 MG tablet, Take by mouth., Disp: , Rfl:  .  dexlansoprazole (DEXILANT) 60 MG capsule, Take 1 capsule (60 mg total) by mouth daily., Disp: 90 capsule, Rfl: 0   Family History  Problem Relation Age of Onset  . Alcohol abuse Mother   . Cancer Mother   . Stroke Mother   . Alcohol abuse Father   . Arthritis Sister   . Arthritis Daughter   . COPD Daughter   . Arthritis Paternal Grandmother   . Asthma Paternal Grandmother   . COPD Paternal Grandmother   . Breast cancer Paternal Aunt      Social History   Tobacco Use  . Smoking status: Never Smoker  . Smokeless tobacco: Never Used  Substance Use Topics  .  Alcohol use: Yes    Comment: rare  . Drug use: Never    Allergies as of 04/08/2019 - Review Complete 04/08/2019  Allergen Reaction Noted  . Other Anaphylaxis 12/03/2018  . Ciprofloxacin hcl Rash 12/29/2016  . Sulfa antibiotics Rash 06/10/2012    Review  of Systems:    All systems reviewed and negative except where noted in HPI.   Physical Exam:  BP 133/73 (BP Location: Left Arm, Patient Position: Sitting, Cuff Size: Normal)   Pulse 79   Temp 98.4 F (36.9 C) (Oral)   Wt 164 lb (74.4 kg)   LMP  (LMP Unknown)   BMI 33.12 kg/m  No LMP recorded (lmp unknown). Patient has had a hysterectomy.  General:   Alert,  Well-developed, well-nourished, pleasant and cooperative in NAD Head:  Normocephalic and atraumatic. Eyes:  Sclera clear, no icterus.   Conjunctiva pink. Ears:  Normal auditory acuity. Nose:  No deformity, discharge, or lesions. Mouth:  No deformity or lesions,oropharynx pink & moist. Neck:  Supple; no masses or thyromegaly. Lungs:  Respirations even and unlabored.  Clear throughout to auscultation.   No wheezes, crackles, or rhonchi. No acute distress. Heart:  Regular rate and rhythm; no murmurs, clicks, rubs, or gallops. Abdomen:  Normal bowel sounds. Soft, mild tenderness in the suprapubic and right lower quadrant and non-distended without masses, hepatosplenomegaly or hernias noted.  No guarding or rebound tenderness.   Rectal: Not performed Msk:  Symmetrical without gross deformities. Good, equal movement & strength bilaterally. Pulses:  Normal pulses noted. Extremities:  No clubbing or edema.  No cyanosis. Neurologic:  Alert and oriented x3;  grossly normal neurologically. Skin:  Intact without significant lesions or rashes. No jaundice. Psych:  Alert and cooperative. Normal mood and affect.  Imaging Studies: Reviewed  Assessment and Plan:   Erin Petersen is a 79 y.o. female with history of metabolic syndrome, chronic right lower quadrant pain, history of  hernia repair, bilateral oophorectomy, interstitial cystitis seen for follow-up of new onset of constipation, flareup of regurgitation and nausea since stopping PPI  Recent exacerbation of regurgitation and nausea Most likely secondary to reflux Restart Dexilant 60 mg daily Recommend EGD for further evaluation as patient is not able to wean off PPI with recurrence of reflux symptoms Advised patient to completely eliminate chocolate for few weeks and see if it helps Follow antireflux lifestyle Encouraged patient not to gain weight further  Chronic right lower quadrant pain Work-up unremarkable from GI standpoint.  Her pain is most likely related to interstitial cystitis Follow-up with urology  Severe iron deficiency, microcytosis without anemia contributing to chronic fatigue and restless legs Treated with took fusion plus for 2 months Microcytosis has resolved, hemoglobin has improved  Chronic constipation: Most likely medication related with increase in dose of gabapentin, addition of diazepam and hydroxyzine Encouraged high-fiber diet, adequate intake of water Continues to take MiraLAX 1-2 times a day Discussed about prescription medications to treat constipation, patient would like to try MiraLAX first   Follow up in 2 months   [name], MD

## 2019-04-09 ENCOUNTER — Telehealth: Payer: Self-pay

## 2019-04-09 ENCOUNTER — Encounter: Payer: Self-pay | Admitting: Gastroenterology

## 2019-04-09 ENCOUNTER — Other Ambulatory Visit: Payer: Self-pay

## 2019-04-09 DIAGNOSIS — M722 Plantar fascial fibromatosis: Secondary | ICD-10-CM | POA: Diagnosis not present

## 2019-04-09 NOTE — Telephone Encounter (Signed)
Pt wants to know if orders are in for her to come in for her labs prior to her physical w/Debbie Carlean Purl and when she should come in for these labs.

## 2019-04-09 NOTE — Telephone Encounter (Signed)
Noted  

## 2019-04-09 NOTE — Telephone Encounter (Signed)
Spoke w/pt and advised her about [facility] payment plan.  She will think about if she wants to do this and will c/b to schedule.  If not she will wait until she can afford.  Also lmom to see if any available options for pt.

## 2019-04-10 NOTE — Telephone Encounter (Signed)
Spoke w/Prolia rep and pt this morning.  Pt to apply to see if she can get $1000 grant from [email or web address].  She will let me know if she is approved and then we will send Rx for Prolia to her pharmacy and bring to her appt.  If pt chooses to send to [facility] they can send it to [place].

## 2019-04-11 ENCOUNTER — Other Ambulatory Visit: Payer: Self-pay

## 2019-04-11 ENCOUNTER — Other Ambulatory Visit
Admission: RE | Admit: 2019-04-11 | Discharge: 2019-04-11 | Disposition: A | Discharge status: Home / Self Care | Payer: Medicare Other | Source: Ambulatory Visit | Attending: Gastroenterology | Admitting: Gastroenterology

## 2019-04-11 DIAGNOSIS — Z20822 Contact with and (suspected) exposure to covid-19: Secondary | ICD-10-CM | POA: Insufficient documentation

## 2019-04-11 DIAGNOSIS — Z01812 Encounter for preprocedural laboratory examination: Secondary | ICD-10-CM | POA: Insufficient documentation

## 2019-04-11 NOTE — Discharge Instructions (Signed)
General Anesthesia, Adult, Care After This sheet gives you information about how to care for yourself after your procedure. Your health care provider may also give you more specific instructions. If you have problems or questions, contact your health care provider. What can I expect after the procedure? After the procedure, the following side effects are common:  Pain or discomfort at the IV site.  Nausea.  Vomiting.  Sore throat.  Trouble concentrating.  Feeling cold or chills.  Weak or tired.  Sleepiness and fatigue.  Soreness and body aches. These side effects can affect parts of the body that were not involved in surgery. Follow these instructions at home:  For at least 24 hours after the procedure:  Have a responsible adult stay with you. It is important to have someone help care for you until you are awake and alert.  Rest as needed.  Do not: ? Participate in activities in which you could fall or become injured. ? Drive. ? Use heavy machinery. ? Drink alcohol. ? Take sleeping pills or medicines that cause drowsiness. ? Make important decisions or sign legal documents. ? Take care of children on your own. Eating and drinking  Follow any instructions from your health care provider about eating or drinking restrictions.  When you feel hungry, start by eating small amounts of foods that are soft and easy to digest (bland), such as toast. Gradually return to your regular diet.  Drink enough fluid to keep your urine pale yellow.  If you vomit, rehydrate by drinking water, juice, or clear broth. General instructions  If you have sleep apnea, surgery and certain medicines can increase your risk for breathing problems. Follow instructions from your health care provider about wearing your sleep device: ? Anytime you are sleeping, including during daytime naps. ? While taking prescription pain medicines, sleeping medicines, or medicines that make you drowsy.  Return to  your normal activities as told by your health care provider. Ask your health care provider what activities are safe for you.  Take over-the-counter and prescription medicines only as told by your health care provider.  If you smoke, do not smoke without supervision.  Keep all follow-up visits as told by your health care provider. This is important. Contact a health care provider if:  You have nausea or vomiting that does not get better with medicine.  You cannot eat or drink without vomiting.  You have pain that does not get better with medicine.  You are unable to pass urine.  You develop a skin rash.  You have a fever.  You have redness around your IV site that gets worse. Get help right away if:  You have difficulty breathing.  You have chest pain.  You have blood in your urine or stool, or you vomit blood. Summary  After the procedure, it is common to have a sore throat or nausea. It is also common to feel tired.  Have a responsible adult stay with you for the first 24 hours after general anesthesia. It is important to have someone help care for you until you are awake and alert.  When you feel hungry, start by eating small amounts of foods that are soft and easy to digest (bland), such as toast. Gradually return to your regular diet.  Drink enough fluid to keep your urine pale yellow.  Return to your normal activities as told by your health care provider. Ask your health care provider what activities are safe for you. This information is not   intended to replace advice given to you by your health care provider. Make sure you discuss any questions you have with your health care provider. Document Revised: 02/16/2017 Document Reviewed: 09/29/2016 Elsevier Patient Education  2020 Elsevier Inc.  

## 2019-04-12 LAB — SARS CORONAVIRUS 2 (TAT 6-24 HRS): SARS Coronavirus 2: NEGATIVE

## 2019-04-14 ENCOUNTER — Ambulatory Visit: Payer: Medicare Other

## 2019-04-14 ENCOUNTER — Other Ambulatory Visit: Payer: Self-pay | Admitting: Family Medicine

## 2019-04-14 DIAGNOSIS — E7800 Pure hypercholesterolemia, unspecified: Secondary | ICD-10-CM

## 2019-04-14 DIAGNOSIS — R7303 Prediabetes: Secondary | ICD-10-CM

## 2019-04-14 DIAGNOSIS — E559 Vitamin D deficiency, unspecified: Secondary | ICD-10-CM

## 2019-04-14 DIAGNOSIS — E2839 Other primary ovarian failure: Secondary | ICD-10-CM

## 2019-04-15 ENCOUNTER — Other Ambulatory Visit: Payer: Self-pay | Admitting: Gastroenterology

## 2019-04-15 ENCOUNTER — Ambulatory Visit: Payer: Medicare Other | Admitting: Anesthesiology

## 2019-04-15 ENCOUNTER — Ambulatory Visit
Admission: RE | Admit: 2019-04-15 | Discharge: 2019-04-15 | Disposition: A | Discharge status: Home / Self Care | Payer: Medicare Other | Attending: Gastroenterology | Admitting: Gastroenterology

## 2019-04-15 ENCOUNTER — Encounter
Admission: RE | Disposition: A | Discharge status: Home / Self Care | Payer: Self-pay | Source: Home / Self Care | Attending: Gastroenterology

## 2019-04-15 ENCOUNTER — Other Ambulatory Visit: Payer: Self-pay

## 2019-04-15 ENCOUNTER — Ambulatory Visit: Payer: Medicare Other

## 2019-04-15 ENCOUNTER — Encounter: Payer: Self-pay | Admitting: Gastroenterology

## 2019-04-15 DIAGNOSIS — K3189 Other diseases of stomach and duodenum: Secondary | ICD-10-CM | POA: Insufficient documentation

## 2019-04-15 DIAGNOSIS — G2581 Restless legs syndrome: Secondary | ICD-10-CM | POA: Insufficient documentation

## 2019-04-15 DIAGNOSIS — Z882 Allergy status to sulfonamides status: Secondary | ICD-10-CM | POA: Insufficient documentation

## 2019-04-15 DIAGNOSIS — E785 Hyperlipidemia, unspecified: Secondary | ICD-10-CM | POA: Diagnosis not present

## 2019-04-15 DIAGNOSIS — Z881 Allergy status to other antibiotic agents status: Secondary | ICD-10-CM | POA: Diagnosis not present

## 2019-04-15 DIAGNOSIS — G4730 Sleep apnea, unspecified: Secondary | ICD-10-CM | POA: Diagnosis not present

## 2019-04-15 DIAGNOSIS — M1990 Unspecified osteoarthritis, unspecified site: Secondary | ICD-10-CM | POA: Insufficient documentation

## 2019-04-15 DIAGNOSIS — J45909 Unspecified asthma, uncomplicated: Secondary | ICD-10-CM | POA: Diagnosis not present

## 2019-04-15 DIAGNOSIS — I10 Essential (primary) hypertension: Secondary | ICD-10-CM | POA: Diagnosis not present

## 2019-04-15 DIAGNOSIS — Z7982 Long term (current) use of aspirin: Secondary | ICD-10-CM | POA: Insufficient documentation

## 2019-04-15 DIAGNOSIS — Z79899 Other long term (current) drug therapy: Secondary | ICD-10-CM | POA: Insufficient documentation

## 2019-04-15 DIAGNOSIS — K219 Gastro-esophageal reflux disease without esophagitis: Secondary | ICD-10-CM | POA: Insufficient documentation

## 2019-04-15 DIAGNOSIS — K2980 Duodenitis without bleeding: Secondary | ICD-10-CM | POA: Diagnosis not present

## 2019-04-15 DIAGNOSIS — K269 Duodenal ulcer, unspecified as acute or chronic, without hemorrhage or perforation: Secondary | ICD-10-CM

## 2019-04-15 DIAGNOSIS — G629 Polyneuropathy, unspecified: Secondary | ICD-10-CM | POA: Insufficient documentation

## 2019-04-15 DIAGNOSIS — R1013 Epigastric pain: Secondary | ICD-10-CM | POA: Diagnosis not present

## 2019-04-15 HISTORY — DX: Polyneuropathy, unspecified: G62.9

## 2019-04-15 HISTORY — DX: Plantar fascial fibromatosis: M72.2

## 2019-04-15 HISTORY — PX: BIOPSY: SHX5522

## 2019-04-15 HISTORY — PX: ESOPHAGOGASTRODUODENOSCOPY (EGD) WITH PROPOFOL: SHX5813

## 2019-04-15 HISTORY — DX: Sleep apnea, unspecified: G47.30

## 2019-04-15 HISTORY — DX: Presence of external hearing-aid: Z97.4

## 2019-04-15 SURGERY — ESOPHAGOGASTRODUODENOSCOPY (EGD) WITH PROPOFOL
Anesthesia: General | Site: Throat

## 2019-04-15 MED ORDER — LIDOCAINE HCL (CARDIAC) PF 100 MG/5ML IV SOSY
PREFILLED_SYRINGE | INTRAVENOUS | Status: DC | PRN
  Administered 2019-04-15: 50 mg via INTRAVENOUS

## 2019-04-15 MED ORDER — STERILE WATER FOR IRRIGATION IR SOLN
Status: DC | PRN
  Administered 2019-04-15: 12:00:00 50 mL

## 2019-04-15 MED ORDER — GLYCOPYRROLATE 0.2 MG/ML IJ SOLN
INTRAMUSCULAR | Status: DC | PRN
  Administered 2019-04-15: .1 mg via INTRAVENOUS

## 2019-04-15 MED ORDER — OMEPRAZOLE 40 MG PO CPDR: 40.0000 mg | DELAYED_RELEASE_CAPSULE | Freq: Two times a day (BID) | ORAL | 2 refills | Status: DC

## 2019-04-15 MED ORDER — PROPOFOL 10 MG/ML IV BOLUS
INTRAVENOUS | Status: DC | PRN
  Administered 2019-04-15 (×3): 20 mg via INTRAVENOUS
  Administered 2019-04-15: 80 mg via INTRAVENOUS

## 2019-04-15 MED ORDER — LACTATED RINGERS IV SOLN: INTRAVENOUS | Status: DC

## 2019-04-15 SURGICAL SUPPLY — 33 items
BALLN DILATOR 10-12 8 (BALLOONS)
BALLN DILATOR 12-15 8 (BALLOONS)
BALLN DILATOR 15-18 8 (BALLOONS)
BALLN DILATOR CRE 0-12 8 (BALLOONS)
BALLN DILATOR ESOPH 8 10 CRE (MISCELLANEOUS) IMPLANT
BALLOON DILATOR 12-15 8 (BALLOONS) IMPLANT
BALLOON DILATOR 15-18 8 (BALLOONS) IMPLANT
BALLOON DILATOR CRE 0-12 8 (BALLOONS) IMPLANT
BLOCK BITE 60FR ADLT L/F GRN (MISCELLANEOUS) ×4 IMPLANT
CANISTER SUCT 1200ML W/VALVE (MISCELLANEOUS) ×4 IMPLANT
CLIP HMST 235XBRD CATH ROT (MISCELLANEOUS) IMPLANT
CLIP RESOLUTION 360 11X235 (MISCELLANEOUS)
ELECT REM PT RETURN 9FT ADLT (ELECTROSURGICAL)
ELECTRODE REM PT RTRN 9FT ADLT (ELECTROSURGICAL) IMPLANT
FCP ESCP3.2XJMB 240X2.8X (MISCELLANEOUS) ×2
FORCEPS BIOP RAD 4 LRG CAP 4 (CUTTING FORCEPS) IMPLANT
FORCEPS BIOP RJ4 240 W/NDL (MISCELLANEOUS) ×2
FORCEPS ESCP3.2XJMB 240X2.8X (MISCELLANEOUS) ×2 IMPLANT
GOWN CVR UNV OPN BCK APRN NK (MISCELLANEOUS) ×4 IMPLANT
GOWN ISOL THUMB LOOP REG UNIV (MISCELLANEOUS) ×4
INJECTOR VARIJECT VIN23 (MISCELLANEOUS) IMPLANT
KIT DEFENDO VALVE AND CONN (KITS) IMPLANT
KIT ENDO PROCEDURE OLY (KITS) ×4 IMPLANT
MARKER SPOT ENDO TATTOO 5ML (MISCELLANEOUS) IMPLANT
RETRIEVER NET PLAT FOOD (MISCELLANEOUS) IMPLANT
SNARE SHORT THROW 13M SML OVAL (MISCELLANEOUS) IMPLANT
SNARE SHORT THROW 30M LRG OVAL (MISCELLANEOUS) IMPLANT
SPOT EX ENDOSCOPIC TATTOO (MISCELLANEOUS)
SYR INFLATION 60ML (SYRINGE) IMPLANT
TRAP ETRAP POLY (MISCELLANEOUS) IMPLANT
VARIJECT INJECTOR VIN23 (MISCELLANEOUS)
WATER STERILE IRR 250ML POUR (IV SOLUTION) ×4 IMPLANT
WIRE CRE 18-20MM 8CM F G (MISCELLANEOUS) IMPLANT

## 2019-04-15 NOTE — Op Note (Signed)
[facility] Patient Name: Erin Petersen Procedure Date: 04/15/2019 11:29 AM MRN: [id number] Account #: [id number] Date of Birth: [birth year] Admit Type: Outpatient Age: 79 Room: [facility] OR ROOM 01 Gender: Female Note Status: Finalized Procedure:             Upper GI endoscopy Indications:           Epigastric abdominal pain, Follow-up of esophageal                         reflux Providers:             [name] MD, MD Referring MD:          Elby Beck (Referring MD) Medicines:             Monitored Anesthesia Care Complications:         No immediate complications. Estimated blood loss: None. Procedure:             Pre-Anesthesia Assessment:                        - Prior to the procedure, a History and Physical was                         performed, and patient medications and allergies were                         reviewed. The patient is competent. The risks and                         benefits of the procedure and the sedation options and                         risks were discussed with the patient. All questions                         were answered and informed consent was obtained.                         Patient identification and proposed procedure were                         verified by the physician, the nurse, the                         anesthesiologist, the anesthetist and the technician                         in the pre-procedure area in the procedure room in the                         endoscopy suite. Mental Status Examination: alert and                         oriented. Airway Examination: normal oropharyngeal                         airway and neck mobility. Respiratory Examination:  clear to auscultation. CV Examination: normal.                         Prophylactic Antibiotics: The patient does not require                         prophylactic antibiotics. Prior Anticoagulants: The                          patient has taken no previous anticoagulant or                         antiplatelet agents. ASA Grade Assessment: II - A                         patient with mild systemic disease. After reviewing                         the risks and benefits, the patient was deemed in                         satisfactory condition to undergo the procedure. The                         anesthesia plan was to use monitored anesthesia care                         (MAC). Immediately prior to administration of                         medications, the patient was re-assessed for adequacy                         to receive sedatives. The heart rate, respiratory                         rate, oxygen saturations, blood pressure, adequacy of                         pulmonary ventilation, and response to care were                         monitored throughout the procedure. The physical                         status of the patient was re-assessed after the                         procedure.                        After obtaining informed consent, the endoscope was                         passed under direct vision. Throughout the procedure,                         the patient's blood pressure, pulse, and oxygen  saturations were monitored continuously. The was                         introduced through the mouth, and advanced to the                         second part of duodenum. The upper GI endoscopy was                         accomplished without difficulty. The patient tolerated                         the procedure well. Findings:      One non-bleeding cratered duodenal ulcer with a clean ulcer base       (Forrest Class III) was found in the duodenal bulb. The lesion was 15 mm       in largest dimension. Biopsies from the margin were taken with a cold       forceps for histology. Estimated blood loss was minimal.      The second portion of the duodenum was normal.       Diffuse mildly erythematous mucosa without bleeding was found in the       gastric body. Biopsies were taken with a cold forceps for Helicobacter       pylori testing.      The incisura and gastric antrum were normal. Biopsies were taken with a       cold forceps for Helicobacter pylori testing.      Esophagogastric landmarks were identified: the gastroesophageal junction       was found at 35 cm from the incisors.      The gastroesophageal junction and examined esophagus were normal. Impression:            - Non-bleeding duodenal ulcer with a clean ulcer base                         (Forrest Class III). Biopsied.                        - Normal second portion of the duodenum.                        - Erythematous mucosa in the gastric body. Biopsied.                        - Normal incisura and antrum. Biopsied.                        - Esophagogastric landmarks identified.                        - Normal gastroesophageal junction and esophagus. Recommendation:        - Discharge patient to home (with escort).                        - Resume previous diet today.                        - Continue present medications.                        -  Await pathology results.                        - Use Prilosec (omeprazole) 40 mg PO BID for 3 months.                        - Return to my office as previously scheduled. Procedure Code(s):     --- Professional ---                        [id number], Esophagogastroduodenoscopy, flexible,                         transoral; with biopsy, single or multiple Diagnosis Code(s):     --- Professional ---                        K26.9, Duodenal ulcer, unspecified as acute or                         chronic, without hemorrhage or perforation                        K31.89, Other diseases of stomach and duodenum                        R10.13, Epigastric pain                        K21.9, Gastro-esophageal reflux disease without                          esophagitis CPT copyright 2019 American Medical Association. All rights reserved. The codes documented in this report are preliminary and upon coder review may  be revised to meet current compliance requirements. Dr. Ulyess Mort [name] MD, MD 04/15/2019 11:54:25 AM This report has been signed electronically. Number of Addenda: 0 Note Initiated On: 04/15/2019 11:29 AM Total Procedure Duration: 0 hours 7 minutes 50 seconds  Estimated Blood Loss:  Estimated blood loss: none.      [facility]

## 2019-04-15 NOTE — Anesthesia Preprocedure Evaluation (Signed)
Anesthesia Evaluation  Patient identified by MRN, date of birth, ID band Patient awake    Reviewed: Allergy & Precautions, H&P , NPO status , Patient's Chart, lab work & pertinent test results, reviewed documented beta blocker date and time   Airway Mallampati: II  TM Distance: >3 FB Neck ROM: full    Dental no notable dental hx.    Pulmonary shortness of breath, asthma , sleep apnea ,    Pulmonary exam normal breath sounds clear to auscultation       Cardiovascular Exercise Tolerance: Good hypertension, negative cardio ROS   Rhythm:regular Rate:Normal     Neuro/Psych  Neuromuscular disease (RLS, neuropathy in RLE) negative psych ROS   GI/Hepatic Neg liver ROS, GERD  ,  Endo/Other  negative endocrine ROS  Renal/GU negative Renal ROS  negative genitourinary   Musculoskeletal   Abdominal   Peds  Hematology negative hematology ROS (+)   Anesthesia Other Findings   Reproductive/Obstetrics negative OB ROS                             Anesthesia Physical Anesthesia Plan  ASA: II  Anesthesia Plan: General   Post-op Pain Management:    Induction:   PONV Risk Score and Plan: 3 and Propofol infusion, TIVA and Treatment may vary due to age or medical condition  Airway Management Planned:   Additional Equipment:   Intra-op Plan:   Post-operative Plan:   Informed Consent: I have reviewed the patients History and Physical, chart, labs and discussed the procedure including the risks, benefits and alternatives for the proposed anesthesia with the patient or authorized representative who has indicated his/her understanding and acceptance.     Dental Advisory Given  Plan Discussed with: CRNA  Anesthesia Plan Comments:         Anesthesia Quick Evaluation

## 2019-04-15 NOTE — Transfer of Care (Signed)
Immediate Anesthesia Transfer of Care Note  Patient: Erin Petersen  Procedure(s) Performed: ESOPHAGOGASTRODUODENOSCOPY (EGD) WITH PROPOFOL (N/A Throat) BIOPSY (Throat)  Patient Location: PACU  Anesthesia Type: General  Level of Consciousness: awake, alert  and patient cooperative  Airway and Oxygen Therapy: Patient Spontanous Breathing and Patient connected to supplemental oxygen  Post-op Assessment: Post-op Vital signs reviewed, Patient's Cardiovascular Status Stable, Respiratory Function Stable, Patent Airway and No signs of Nausea or vomiting  Post-op Vital Signs: Reviewed and stable  Complications: No apparent anesthesia complications

## 2019-04-15 NOTE — Anesthesia Postprocedure Evaluation (Signed)
Anesthesia Post Note  Patient: Guenevere Roorda  Procedure(s) Performed: ESOPHAGOGASTRODUODENOSCOPY (EGD) WITH PROPOFOL (N/A Throat) BIOPSY (Throat)     Patient location during evaluation: PACU Anesthesia Type: General Level of consciousness: awake and alert Pain management: pain level controlled Vital Signs Assessment: post-procedure vital signs reviewed and stable Respiratory status: spontaneous breathing, nonlabored ventilation, respiratory function stable and patient connected to nasal cannula oxygen Cardiovascular status: blood pressure returned to baseline and stable Postop Assessment: no apparent nausea or vomiting Anesthetic complications: no    Alisa Graff

## 2019-04-15 NOTE — Anesthesia Procedure Notes (Signed)
Performed by: Sunil Hue, CRNA Pre-anesthesia Checklist: Patient identified, Emergency Drugs available, Suction available, Timeout performed and Patient being monitored Patient Re-evaluated:Patient Re-evaluated prior to induction Oxygen Delivery Method: Nasal cannula Placement Confirmation: positive ETCO2       

## 2019-04-15 NOTE — H&P (Signed)
Erin Darby, MD [address]  [place]  [place], [place] [address]  Main: [phone]  Fax: [phone] Pager: [phone]  Primary Care Physician:  Elby Beck, FNP Primary Gastroenterologist:  Dr. Cephas Petersen  Pre-Procedure History & Physical: HPI:  Erin Petersen is a 79 y.o. female is here for an endoscopy.   Past Medical History:  Diagnosis Date  . Arthritis   . Asthma    exercise induced asthma  . Colon polyps   . Dyspnea   . GERD (gastroesophageal reflux disease)   . Hyperlipidemia   . Hypertension   . Neuropathy    right leg and foot  . Plantar fasciitis   . Prediabetes   . RLS (restless legs syndrome)   . Sleep apnea    CPAP  . SOB (shortness of breath)   . Urinary incontinence   . Urinary tract infection   . Wears hearing aid in right ear     Past Surgical History:  Procedure Laterality Date  . ABDOMINAL HYSTERECTOMY  1972  . APPENDECTOMY  1958  . BACK SURGERY     lumbar fusion  . BILATERAL SALPINGOOPHORECTOMY  01/07/2018  . CARDIAC CATHETERIZATION     over 10 yrs ago.  [place], [place]  . COLONOSCOPY WITH PROPOFOL N/A 11/11/2018   Procedure: COLONOSCOPY WITH PROPOFOL;  Surgeon: [name], MD;  Location: [facility] ENDOSCOPY;  Service: Gastroenterology;  Laterality: N/A;  . CYSTO WITH HYDRODISTENSION N/A 12/30/2018   Procedure: CYSTOSCOPY/HYDRODISTENSION WITH MARCAINE;  Surgeon: Bjorn Loser, MD;  Location: [facility] ORS;  Service: Urology;  Laterality: N/A;  . HERNIA REPAIR     abd  . HERNIA REPAIR     umbilical  . INGUINAL HERNIA REPAIR Bilateral    inguinal  . LAPAROSCOPIC BILATERAL SALPINGO OOPHERECTOMY    . SPINAL FUSION  2002  . TONSILLECTOMY AND ADENOIDECTOMY  1961    Prior to Admission medications   Medication Sig Start Date End Date Taking? Authorizing Provider  acetaminophen (TYLENOL) 650 MG CR tablet Take 1,300 mg by mouth at bedtime.   Yes [provider]  albuterol (VENTOLIN HFA)  108 (90 Base) MCG/ACT inhaler Inhale 2 puffs into the lungs every 4 (four) hours as needed for wheezing or shortness of breath (cough, shortness of breath or wheezing.). 10/04/18  Yes Elby Beck, FNP  amLODipine (NORVASC) 5 MG tablet Take 1 tablet (5 mg total) by mouth daily. 10/09/18  Yes Elby Beck, FNP  aspirin 81 MG EC tablet Take 81 mg by mouth at bedtime.    Yes [provider]  Cholecalciferol (VITAMIN D3) 50 MCG (2000 UT) TABS Take by mouth at bedtime.   Yes [provider]  Cyanocobalamin (VITAMIN B-12) 3000 MCG SUBL Take 3,000 mcg by mouth daily.   Yes [provider]  dexlansoprazole (DEXILANT) 60 MG capsule Take 1 capsule (60 mg total) by mouth daily. 04/08/19  Yes Savon Cobbs, Tally Due, MD  DIAZEPAM RE Place 10 mg rectally at bedtime.   Yes [provider]  dicyclomine (BENTYL) 10 MG capsule TAKE 1 CAPSULE BY MOUTH AT  BEDTIME 02/18/19  Yes Tamira Ryland, Tally Due, MD  EPINEPHrine 0.3 mg/0.3 mL IJ SOAJ injection Inject 0.3 mg into the muscle as needed for anaphylaxis.  07/20/14  Yes [provider]  estradiol (ESTRACE) 0.1 MG/GM vaginal cream Place 1 Applicatorful vaginally 3 (three) times a week. Patient taking differently: Place 1 Applicatorful vaginally at bedtime.  10/09/18 10/09/19 Yes Clarene Reamer  B, FNP  fluticasone (FLONASE) 50 MCG/ACT nasal spray Place 2 sprays into both nostrils daily. 01/01/19  Yes Elby Beck, FNP  fluticasone (FLOVENT HFA) 44 MCG/ACT inhaler Inhale into the lungs. 01/31/19 01/31/20 Yes [provider]  hydrochlorothiazide (HYDRODIURIL) 25 MG tablet Take 1 tablet (25 mg total) by mouth daily. 10/09/18  Yes Elby Beck, FNP  hydrOXYzine (ATARAX/VISTARIL) 25 MG tablet 1po  one hour before sleep 04/03/19  Yes [provider]  Melatonin 3 MG TABS Take 3 mg by mouth at bedtime.   Yes [provider]  meloxicam (MOBIC) 15 MG tablet Take 1 tablet (15 mg total) by mouth daily as  needed. Patient taking differently: Take 15 mg by mouth daily.  10/09/18  Yes Elby Beck, FNP  Meth-Hyo-M Barnett Hatter Phos-Ph Sal (URIBEL) 118 MG CAPS 1 po q6 hr prn burning 04/03/19  Yes [provider]  pentosan polysulfate (ELMIRON) 100 MG capsule Take by mouth. 04/03/19  Yes [provider]  polyethylene glycol (MIRALAX / GLYCOLAX) 17 g packet Take 1 packet by mouth daily.    Yes [provider]  pregabalin (LYRICA) 100 MG capsule Take 150 mg by mouth 2 (two) times daily.  11/25/18 04/09/19 Yes [provider]  rOPINIRole (REQUIP) 0.5 MG tablet Take 1-3 tablets (0.5-1.5 mg total) by mouth 3 (three) times daily. Take 1 tablet am, 1 pm and 3 tablets at bedtime Patient taking differently: Take 0.5-1.5 mg by mouth See admin instructions. Take 1 tablet (0.5 mg) by mouth in the morning, 1 tablet (0.5 mg) by mouth in the afternoon, & take 3 tablets (1.5 mg) by mouth at bedtime. 10/09/18  Yes Elby Beck, FNP  simvastatin (ZOCOR) 10 MG tablet Take 1 tablet (10 mg total) by mouth daily before supper. Patient taking differently: Take 10 mg by mouth at bedtime.  10/09/18  Yes Elby Beck, FNP  amitriptyline (ELAVIL) 25 MG tablet Take by mouth.    [provider]  diclofenac sodium (VOLTAREN) 1 % GEL Apply 1 application topically 2 (two) times daily as needed (pain.).     [provider]  lidocaine (XYLOCAINE) 2 % jelly Apply 1 application topically as needed (pain.).    [provider]  lidocaine (XYLOCAINE) 5 % ointment  04/03/19   [provider]  loratadine (CLARITIN) 10 MG tablet Take by mouth.    [provider]    Allergies as of 04/08/2019 - Review Complete 04/08/2019  Allergen Reaction Noted  . Other Anaphylaxis 12/03/2018  . Ciprofloxacin hcl Rash 12/29/2016  . Sulfa antibiotics Rash 06/10/2012    Family History  Problem Relation Age of Onset  . Alcohol abuse Mother   . Cancer Mother   . Stroke Mother    . Alcohol abuse Father   . Arthritis Sister   . Arthritis Daughter   . COPD Daughter   . Arthritis Paternal Grandmother   . Asthma Paternal Grandmother   . COPD Paternal Grandmother   . Breast cancer Paternal Aunt     Social History   Socioeconomic History  . Marital status: Widowed    Spouse name: Not on file  . Number of children: 3  . Years of education: Not on file  . Highest education level: Bachelor's degree (e.g., BA, AB, BS)  Occupational History  . Not on file  Tobacco Use  . Smoking status: Never Smoker  . Smokeless tobacco: Never Used  Substance and Sexual Activity  . Alcohol use: Yes  Comment: rare  . Drug use: Never  . Sexual activity: Not Currently  Other Topics Concern  . Not on file  Social History Narrative   Moved from [place]. 2020, lives near daughter. Retired. Enjoys walking.    Social Determinants of Health   Financial Resource Strain:   . Difficulty of Paying Living Expenses: Not on file  Food Insecurity:   . Worried About Charity fundraiser in the Last Year: Not on file  . Ran Out of Food in the Last Year: Not on file  Transportation Needs:   . Lack of Transportation (Medical): Not on file  . Lack of Transportation (Non-Medical): Not on file  Physical Activity:   . Days of Exercise per Week: Not on file  . Minutes of Exercise per Session: Not on file  Stress:   . Feeling of Stress : Not on file  Social Connections:   . Frequency of Communication with Friends and Family: Not on file  . Frequency of Social Gatherings with Friends and Family: Not on file  . Attends Religious Services: Not on file  . Active Member of Clubs or Organizations: Not on file  . Attends Archivist Meetings: Not on file  . Marital Status: Not on file  Intimate Partner Violence:   . Fear of Current or Ex-Partner: Not on file  . Emotionally Abused: Not on file  . Physically Abused: Not on file  . Sexually Abused: Not on file    Review of  Systems: See HPI, otherwise negative ROS  Physical Exam: BP (!) 164/71   Pulse 67   Temp (!) 97.2 F (36.2 C) (Temporal)   Ht 4' 11"  (1.499 m)   Wt 73 kg   LMP  (LMP Unknown)   SpO2 100%   BMI 32.52 kg/m  General:   Alert,  pleasant and cooperative in NAD Head:  Normocephalic and atraumatic. Neck:  Supple; no masses or thyromegaly. Lungs:  Clear throughout to auscultation.    Heart:  Regular rate and rhythm. Abdomen:  Soft, nontender and nondistended. Normal bowel sounds, without guarding, and without rebound.   Neurologic:  Alert and  oriented x4;  grossly normal neurologically.  Impression/Plan: Wesleigh Markovic is here for an endoscopy to be performed for chronic gerd  Risks, benefits, limitations, and alternatives regarding  endoscopy have been reviewed with the patient.  Questions have been answered.  All parties agreeable.   Sherri Sear, MD  04/15/2019, 11:00 AM

## 2019-04-16 ENCOUNTER — Encounter: Payer: Self-pay | Admitting: *Deleted

## 2019-04-16 ENCOUNTER — Encounter: Payer: Self-pay | Admitting: Gastroenterology

## 2019-04-17 ENCOUNTER — Other Ambulatory Visit: Payer: Medicare Other

## 2019-04-17 ENCOUNTER — Ambulatory Visit (INDEPENDENT_AMBULATORY_CARE_PROVIDER_SITE_OTHER): Payer: Medicare Other

## 2019-04-17 ENCOUNTER — Other Ambulatory Visit: Payer: Self-pay

## 2019-04-17 VITALS — Wt 160.0 lb

## 2019-04-17 DIAGNOSIS — Z0000 Encounter for general adult medical examination without abnormal findings: Secondary | ICD-10-CM

## 2019-04-17 LAB — SURGICAL PATHOLOGY

## 2019-04-17 NOTE — Progress Notes (Signed)
PCP notes:  Health Maintenance: Prevnar 13- postpone, Patient just received COVID vaccines.   Tdap- insurance/financial   Abnormal Screenings: none   Patient concerns: Discuss weight concerns   Nurse concerns: none   Next PCP appt.: 04/21/2019 @ 10:30 am

## 2019-04-17 NOTE — Progress Notes (Signed)
Subjective:   Naba Sneed is a 79 y.o. female who presents for Medicare Annual (Subsequent) preventive examination.  Review of Systems: N/A   This visit is being conducted through telemedicine via telephone at the nurse health advisor's home address due to the COVID-19 pandemic. This patient has given me verbal consent via doximity to conduct this visit, patient states they are participating from their home address. Patient and myself are on the telephone call. There is no referral for this visit. Some vital signs may be absent or patient reported.    Patient identification: identified by name, DOB, and current address   Cardiac Risk Factors include: advanced age (>57mn, >>41women);hypertension;Other (see comment), Risk factor comments: hypercholesterolemia     Objective:     Vitals: Wt 160 lb (72.6 kg)   LMP  (LMP Unknown)   BMI 32.32 kg/m   Body mass index is 32.32 kg/m.  Advanced Directives 04/17/2019 04/15/2019 12/26/2018 11/11/2018  Does Patient Have a Medical Advance Directive? Yes Yes Yes Yes  Type of AParamedicof AProsperLiving will H[place]linLiving will H[place]illingtonLiving will -  Copy of H[place]inein Chart? No - copy requested Yes - validated most recent copy scanned in chart (See row information) No - copy requested -  Would patient like information on creating a medical advance directive? - - No - Patient declined -    Tobacco Social History   Tobacco Use  Smoking Status Never Smoker  Smokeless Tobacco Never Used     Counseling given: Not Answered   Clinical Intake:  Pre-visit preparation completed: Yes  Pain : No/denies pain Pain Score: 0-No pain     Nutritional Risks: Nausea/ vomitting/ diarrhea(has a lot of nausea) Diabetes: No  How often do you need to have someone help you when you read instructions, pamphlets, or other written materials from your doctor or  pharmacy?: 1 - Never What is the last grade level you completed in school?: Some college  Interpreter Needed?: No  Information entered by :: CJohnson, LPN  Past Medical History:  Diagnosis Date  . Arthritis   . Asthma    exercise induced asthma  . Colon polyps   . Dyspnea   . GERD (gastroesophageal reflux disease)   . Hyperlipidemia   . Hypertension   . Neuropathy    right leg and foot  . Plantar fasciitis   . Prediabetes   . RLS (restless legs syndrome)   . Sleep apnea    CPAP  . SOB (shortness of breath)   . Urinary incontinence   . Urinary tract infection   . Wears hearing aid in right ear    Past Surgical History:  Procedure Laterality Date  . ABDOMINAL HYSTERECTOMY  1972  . APPENDECTOMY  1958  . BACK SURGERY     lumbar fusion  . BILATERAL SALPINGOOPHORECTOMY  01/07/2018  . BIOPSY  04/15/2019   Procedure: BIOPSY;  Surgeon: VLin Landsman MD;  [place] [facility]  Service: Endoscopy;;  . CARDIAC CATHETERIZATION     over 10 yrs ago.  [place] COLONOSCOPY WITH PROPOFOL N/A 11/11/2018   Procedure: COLONOSCOPY WITH PROPOFOL;  Surgeon: VLin Landsman MD;  [facility];  Service: Gastroenterology;  Laterality: N/A;  . CYSTO WITH HYDRODISTENSION N/A 12/30/2018   Procedure: CYSTOSCOPY/HYDRODISTENSION WITH MARCAINE;  Surgeon: MBjorn Loser MD;  Location: [facility];  Service: Urology;  Laterality: N/A;  . ESOPHAGOGASTRODUODENOSCOPY (  EGD) WITH PROPOFOL N/A 04/15/2019   Procedure: ESOPHAGOGASTRODUODENOSCOPY (EGD) WITH PROPOFOL;  Surgeon: [name], MD;  Location: [place];  Service: Endoscopy;  Laterality: N/A;  sleep apnea  . HERNIA REPAIR     abd  . HERNIA REPAIR     umbilical  . INGUINAL HERNIA REPAIR Bilateral    inguinal  . LAPAROSCOPIC BILATERAL SALPINGO OOPHERECTOMY    . SPINAL FUSION  2002  . TONSILLECTOMY AND ADENOIDECTOMY  1961   Family History  Problem Relation Age of Onset  .  Alcohol abuse Mother   . Cancer Mother   . Stroke Mother   . Alcohol abuse Father   . Arthritis Sister   . Arthritis Daughter   . COPD Daughter   . Arthritis Paternal Grandmother   . Asthma Paternal Grandmother   . COPD Paternal Grandmother   . Breast cancer Paternal Aunt    Social History   Socioeconomic History  . Marital status: Widowed    Spouse name: Not on file  . Number of children: 3  . Years of education: Not on file  . Highest education level: Bachelor's degree (e.g., BA, AB, BS)  Occupational History  . Not on file  Tobacco Use  . Smoking status: Never Smoker  . Smokeless tobacco: Never Used  Substance and Sexual Activity  . Alcohol use: Not Currently  . Drug use: Never  . Sexual activity: Not Currently  Other Topics Concern  . Not on file  Social History Narrative   Moved from [place]. 2020, lives near daughter. Retired. Enjoys walking.    Social Determinants of Health   Financial Resource Strain: Low Risk   . Difficulty of Paying Living Expenses: Not hard at all  Food Insecurity: No Food Insecurity  . Worried About Charity fundraiser in the Last Year: Never true  . Ran Out of Food in the Last Year: Never true  Transportation Needs: No Transportation Needs  . Lack of Transportation (Medical): No  . Lack of Transportation (Non-Medical): No  Physical Activity: Inactive  . Days of Exercise per Week: 0 days  . Minutes of Exercise per Session: 0 min  Stress: No Stress Concern Present  . Feeling of Stress : Not at all  Social Connections:   . Frequency of Communication with Friends and Family: Not on file  . Frequency of Social Gatherings with Friends and Family: Not on file  . Attends Religious Services: Not on file  . Active Member of Clubs or Organizations: Not on file  . Attends Archivist Meetings: Not on file  . Marital Status: Not on file    Outpatient Encounter Medications as of 04/17/2019  Medication Sig  . acetaminophen (TYLENOL) 650  MG CR tablet Take 1,300 mg by mouth at bedtime.  Marland Kitchen albuterol (VENTOLIN HFA) 108 (90 Base) MCG/ACT inhaler Inhale 2 puffs into the lungs every 4 (four) hours as needed for wheezing or shortness of breath (cough, shortness of breath or wheezing.).  Marland Kitchen amLODipine (NORVASC) 5 MG tablet Take 1 tablet (5 mg total) by mouth daily.  Marland Kitchen aspirin 81 MG EC tablet Take 81 mg by mouth at bedtime.   . Cholecalciferol (VITAMIN D3) 50 MCG (2000 UT) TABS Take by mouth at bedtime.  . Cyanocobalamin (VITAMIN B-12) 3000 MCG SUBL Take 3,000 mcg by mouth daily.  Marland Kitchen DIAZEPAM RE Place 10 mg rectally at bedtime.  . diclofenac sodium (VOLTAREN) 1 % GEL Apply 1 application topically 2 (two) times daily as needed (  pain.).   Marland Kitchen dicyclomine (BENTYL) 10 MG capsule TAKE 1 CAPSULE BY MOUTH AT  BEDTIME  . EPINEPHrine 0.3 mg/0.3 mL IJ SOAJ injection Inject 0.3 mg into the muscle as needed for anaphylaxis.   Marland Kitchen estradiol (ESTRACE) 0.1 MG/GM vaginal cream Place 1 Applicatorful vaginally 3 (three) times a week. (Patient taking differently: Place 1 Applicatorful vaginally at bedtime. )  . fluticasone (FLONASE) 50 MCG/ACT nasal spray Place 2 sprays into both nostrils daily.  . fluticasone (FLOVENT HFA) 44 MCG/ACT inhaler Inhale into the lungs.  . hydrochlorothiazide (HYDRODIURIL) 25 MG tablet Take 1 tablet (25 mg total) by mouth daily.  . hydrOXYzine (ATARAX/VISTARIL) 25 MG tablet 1po  one hour before sleep  . lidocaine (XYLOCAINE) 2 % jelly Apply 1 application topically as needed (pain.).  Marland Kitchen lidocaine (XYLOCAINE) 5 % ointment   . Melatonin 3 MG TABS Take 3 mg by mouth at bedtime.  Marland Kitchen omeprazole (PRILOSEC) 40 MG capsule Take 1 capsule (40 mg total) by mouth 2 (two) times daily before a meal.  . pentosan polysulfate (ELMIRON) 100 MG capsule Take by mouth.  . polyethylene glycol (MIRALAX / GLYCOLAX) 17 g packet Take 1 packet by mouth daily.   Marland Kitchen rOPINIRole (REQUIP) 0.5 MG tablet Take 1-3 tablets (0.5-1.5 mg total) by mouth 3 (three) times  daily. Take 1 tablet am, 1 pm and 3 tablets at bedtime (Patient taking differently: Take 0.5-1.5 mg by mouth See admin instructions. Take 1 tablet (0.5 mg) by mouth in the morning, 1 tablet (0.5 mg) by mouth in the afternoon, & take 3 tablets (1.5 mg) by mouth at bedtime.)  . simvastatin (ZOCOR) 10 MG tablet Take 1 tablet (10 mg total) by mouth daily before supper. (Patient taking differently: Take 10 mg by mouth at bedtime. )  . amitriptyline (ELAVIL) 25 MG tablet Take by mouth.  . loratadine (CLARITIN) 10 MG tablet Take by mouth.  . Meth-Hyo-M Bl-Na Phos-Ph Sal (URIBEL) 118 MG CAPS 1 po q6 hr prn burning  . pregabalin (LYRICA) 100 MG capsule Take 150 mg by mouth 2 (two) times daily.    No facility-administered encounter medications on file as of 04/17/2019.    Activities of Daily Living In your present state of health, do you have any difficulty performing the following activities: 04/17/2019 04/15/2019  Hearing? Y N  Comment wears hearing aid -  Vision? N N  Difficulty concentrating or making decisions? Y N  Comment takes medications for memory loss -  Walking or climbing stairs? N N  Comment - -  Dressing or bathing? N N  Doing errands, shopping? N -  Preparing Food and eating ? N -  Using the Toilet? N -  In the past six months, have you accidently leaked urine? Y -  Comment some leakage at night time -  Do you have problems with loss of bowel control? N -  Managing your Medications? N -  Managing your Finances? N -  Housekeeping or managing your Housekeeping? N -    Patient Care Team: Elby Beck, FNP as PCP - General (Nurse Practitioner)    Assessment:   This is a routine wellness examination for [place].  Exercise Activities and Dietary recommendations Current Exercise Habits: The patient does not participate in regular exercise at present, Exercise limited by: Other - see comments(patient has shortness of breath, foot pain)  Goals    . Patient Stated      04/17/2019, I will maintain and continue medications as prescribed.  Fall Risk Fall Risk  04/17/2019 10/04/2018  Falls in the past year? 1 1  Comment balance issues balance issues  Number falls in past yr: 1 1  Injury with Fall? 0 1  Comment - 2019 one fall resulted in broken ribs  Risk for fall due to : Medication side effect;Impaired balance/gait -  Follow up Falls evaluation completed;Falls prevention discussed -   Is the patient's home free of loose throw rugs in walkways, pet beds, electrical cords, etc?   yes      Grab bars in the bathroom? yes      Handrails on the stairs?   no      Adequate lighting?   yes  Timed Get Up and Go performed: N/A  Depression Screen PHQ 2/9 Scores 04/17/2019 10/04/2018  PHQ - 2 Score 0 0  PHQ- 9 Score 0 -     Cognitive Function MMSE - Mini Mental State Exam 04/17/2019  Orientation to time 5  Orientation to Place 5  Registration 3  Attention/ Calculation 5  Recall 3  Language- repeat 1       Mini Cog  Mini-Cog screen was completed. Maximum score is 22. A value of 0 denotes this part of the MMSE was not completed or the patient failed this part of the Mini-Cog screening.  Immunization History  Administered Date(s) Administered  . Fluad Quad(high Dose 65+) 12/03/2018  . Influenza Split 02/22/2001  . Influenza-Unspecified 11/27/2017  . PPD Test 02/27/2014  . Pneumococcal Polysaccharide-23 10/26/1999  . Td 11/01/2001    Qualifies for Shingles Vaccine: Yes  Screening Tests Health Maintenance  Topic Date Due  . PNA vac Low Risk Adult (1 of 2 - PCV13) 09/24/2006  . TETANUS/TDAP  04/19/2021 (Originally 11/02/2011)  . INFLUENZA VACCINE  Completed  . DEXA SCAN  Completed    Cancer Screenings: Lung: Low Dose CT Chest recommended if Age 39-80 years, 30 pack-year currently smoking OR have quit w/in 15years. Patient does not qualify. Breast:  Up to date on Mammogram? Yes, completed 01/21/2019   Up to date of Bone Density/Dexa? Yes,  completed 01/21/2019 Colorectal: completed 11/11/2018  Additional Screenings:  Hepatitis C Screening: N/A     Plan:   Patient will maintain and continue medications as prescribed.    I have personally reviewed and noted the following in the patient's chart:   . Medical and social history . Use of alcohol, tobacco or illicit drugs  . Current medications and supplements . Functional ability and status . Nutritional status . Physical activity . Advanced directives . List of other physicians . Hospitalizations, surgeries, and ER visits in previous 12 months . Vitals . Screenings to include cognitive, depression, and falls . Referrals and appointments  In addition, I have reviewed and discussed with patient certain preventive protocols, quality metrics, and best practice recommendations. A written personalized care plan for preventive services as well as general preventive health recommendations were provided to patient.     Andrez Grime, LPN  [id number]/[id number]

## 2019-04-17 NOTE — Patient Instructions (Signed)
[name] , Thank you for taking time to come for your Medicare Wellness Visit. I appreciate your ongoing commitment to your health goals. Please review the following plan we discussed and let me know if I can assist you in the future.   Screening recommendations/referrals: Colonoscopy: Up to date, completed 11/11/2018 Mammogram: Up to date, completed 01/21/2019 Bone Density: Up to date, completed 01/21/2019 Recommended yearly ophthalmology/optometry visit for glaucoma screening and checkup Recommended yearly dental visit for hygiene and checkup  Vaccinations: Influenza vaccine: Up to date, completed 12/03/2018 Pneumococcal vaccine: postpone due to COVID vaccine Tdap vaccine: decline Shingles vaccine: discussed    Advanced directives: Please bring a copy of your POA (Power of Attorney) and/or Living Will to your next appointment.  Conditions/risks identified: hypertension, hypercholesterolemia  Next appointment: 04/21/2019 @ 10:30 am    Preventive Care 65 Years and Older, Female Preventive care refers to lifestyle choices and visits with your health care provider that can promote health and wellness. What does preventive care include?  A yearly physical exam. This is also called an annual well check.  Dental exams once or twice a year.  Routine eye exams. Ask your health care provider how often you should have your eyes checked.  Personal lifestyle choices, including:  Daily care of your teeth and gums.  Regular physical activity.  Eating a healthy diet.  Avoiding tobacco and drug use.  Limiting alcohol use.  Practicing safe sex.  Taking low-dose aspirin every day.  Taking vitamin and mineral supplements as recommended by your health care provider. What happens during an annual well check? The services and screenings done by your health care provider during your annual well check will depend on your age, overall health, lifestyle risk factors, and family history of  disease. Counseling  Your health care provider may ask you questions about your:  Alcohol use.  Tobacco use.  Drug use.  Emotional well-being.  Home and relationship well-being.  Sexual activity.  Eating habits.  History of falls.  Memory and ability to understand (cognition).  Work and work Statistician.  Reproductive health. Screening  You may have the following tests or measurements:  Height, weight, and BMI.  Blood pressure.  Lipid and cholesterol levels. These may be checked every 5 years, or more frequently if you are over 76 years old.  Skin check.  Lung cancer screening. You may have this screening every year starting at age 17 if you have a 30-pack-year history of smoking and currently smoke or have quit within the past 15 years.  Fecal occult blood test (FOBT) of the stool. You may have this test every year starting at age 2.  Flexible sigmoidoscopy or colonoscopy. You may have a sigmoidoscopy every 5 years or a colonoscopy every 10 years starting at age 9.  Hepatitis C blood test.  Hepatitis B blood test.  Sexually transmitted disease (STD) testing.  Diabetes screening. This is done by checking your blood sugar (glucose) after you have not eaten for a while (fasting). You may have this done every 1-3 years.  Bone density scan. This is done to screen for osteoporosis. You may have this done starting at age 86.  Mammogram. This may be done every 1-2 years. Talk to your health care provider about how often you should have regular mammograms. Talk with your health care provider about your test results, treatment options, and if necessary, the need for more tests. Vaccines  Your health care provider may recommend certain vaccines, such as:  Influenza  vaccine. This is recommended every year.  Tetanus, diphtheria, and acellular pertussis (Tdap, Td) vaccine. You may need a Td booster every 10 years.  Zoster vaccine. You may need this after age  23.  Pneumococcal 13-valent conjugate (PCV13) vaccine. One dose is recommended after age 1.  Pneumococcal polysaccharide (PPSV23) vaccine. One dose is recommended after age 74. Talk to your health care provider about which screenings and vaccines you need and how often you need them. This information is not intended to replace advice given to you by your health care provider. Make sure you discuss any questions you have with your health care provider. Document Released: 03/12/2015 Document Revised: 11/03/2015 Document Reviewed: 12/15/2014 Elsevier Interactive Patient Education  2017 [place] Prevention in the Home Falls can cause injuries. They can happen to people of all ages. There are many things you can do to make your home safe and to help prevent falls. What can I do on the outside of my home?  Regularly fix the edges of walkways and driveways and fix any cracks.  Remove anything that might make you trip as you walk through a door, such as a raised step or threshold.  Trim any bushes or trees on the path to your home.  Use bright outdoor lighting.  Clear any walking paths of anything that might make someone trip, such as rocks or tools.  Regularly check to see if handrails are loose or broken. Make sure that both sides of any steps have handrails.  Any raised decks and porches should have guardrails on the edges.  Have any leaves, snow, or ice cleared regularly.  Use sand or salt on walking paths during winter.  Clean up any spills in your garage right away. This includes oil or grease spills. What can I do in the bathroom?  Use night lights.  Install grab bars by the toilet and in the tub and shower. Do not use towel bars as grab bars.  Use non-skid mats or decals in the tub or shower.  If you need to sit down in the shower, use a plastic, non-slip stool.  Keep the floor dry. Clean up any water that spills on the floor as soon as it happens.  Remove  soap buildup in the tub or shower regularly.  Attach bath mats securely with double-sided non-slip rug tape.  Do not have throw rugs and other things on the floor that can make you trip. What can I do in the bedroom?  Use night lights.  Make sure that you have a light by your bed that is easy to reach.  Do not use any sheets or blankets that are too big for your bed. They should not hang down onto the floor.  Have a firm chair that has side arms. You can use this for support while you get dressed.  Do not have throw rugs and other things on the floor that can make you trip. What can I do in the kitchen?  Clean up any spills right away.  Avoid walking on wet floors.  Keep items that you use a lot in easy-to-reach places.  If you need to reach something above you, use a strong step stool that has a grab bar.  Keep electrical cords out of the way.  Do not use floor polish or wax that makes floors slippery. If you must use wax, use non-skid floor wax.  Do not have throw rugs and other things on the floor that can  make you trip. What can I do with my stairs?  Do not leave any items on the stairs.  Make sure that there are handrails on both sides of the stairs and use them. Fix handrails that are broken or loose. Make sure that handrails are as long as the stairways.  Check any carpeting to make sure that it is firmly attached to the stairs. Fix any carpet that is loose or worn.  Avoid having throw rugs at the top or bottom of the stairs. If you do have throw rugs, attach them to the floor with carpet tape.  Make sure that you have a light switch at the top of the stairs and the bottom of the stairs. If you do not have them, ask someone to add them for you. What else can I do to help prevent falls?  Wear shoes that:  Do not have high heels.  Have rubber bottoms.  Are comfortable and fit you well.  Are closed at the toe. Do not wear sandals.  If you use a  stepladder:  Make sure that it is fully opened. Do not climb a closed stepladder.  Make sure that both sides of the stepladder are locked into place.  Ask someone to hold it for you, if possible.  Clearly mark and make sure that you can see:  Any grab bars or handrails.  First and last steps.  Where the edge of each step is.  Use tools that help you move around (mobility aids) if they are needed. These include:  Canes.  Walkers.  Scooters.  Crutches.  Turn on the lights when you go into a dark area. Replace any light bulbs as soon as they burn out.  Set up your furniture so you have a clear path. Avoid moving your furniture around.  If any of your floors are uneven, fix them.  If there are any pets around you, be aware of where they are.  Review your medicines with your doctor. Some medicines can make you feel dizzy. This can increase your chance of falling. Ask your doctor what other things that you can do to help prevent falls. This information is not intended to replace advice given to you by your health care provider. Make sure you discuss any questions you have with your health care provider. Document Released: 12/10/2008 Document Revised: 07/22/2015 Document Reviewed: 03/20/2014 Elsevier Interactive Patient Education  2017 [employer].

## 2019-04-21 ENCOUNTER — Other Ambulatory Visit: Payer: Self-pay

## 2019-04-21 ENCOUNTER — Other Ambulatory Visit: Payer: Self-pay | Admitting: Family Medicine

## 2019-04-21 ENCOUNTER — Encounter: Payer: Self-pay | Admitting: Family Medicine

## 2019-04-21 ENCOUNTER — Ambulatory Visit (INDEPENDENT_AMBULATORY_CARE_PROVIDER_SITE_OTHER): Payer: Medicare Other | Admitting: Family Medicine

## 2019-04-21 VITALS — BP 124/76 | HR 72 | Temp 98.0°F | Ht 59.0 in | Wt 167.4 lb

## 2019-04-21 DIAGNOSIS — E559 Vitamin D deficiency, unspecified: Secondary | ICD-10-CM

## 2019-04-21 DIAGNOSIS — J3089 Other allergic rhinitis: Secondary | ICD-10-CM

## 2019-04-21 DIAGNOSIS — Z91030 Bee allergy status: Secondary | ICD-10-CM

## 2019-04-21 DIAGNOSIS — R0602 Shortness of breath: Secondary | ICD-10-CM

## 2019-04-21 DIAGNOSIS — R7303 Prediabetes: Secondary | ICD-10-CM | POA: Diagnosis not present

## 2019-04-21 DIAGNOSIS — G2581 Restless legs syndrome: Secondary | ICD-10-CM

## 2019-04-21 DIAGNOSIS — R350 Frequency of micturition: Secondary | ICD-10-CM | POA: Diagnosis not present

## 2019-04-21 DIAGNOSIS — G588 Other specified mononeuropathies: Secondary | ICD-10-CM

## 2019-04-21 DIAGNOSIS — N3010 Interstitial cystitis (chronic) without hematuria: Secondary | ICD-10-CM

## 2019-04-21 DIAGNOSIS — E2839 Other primary ovarian failure: Secondary | ICD-10-CM | POA: Diagnosis not present

## 2019-04-21 DIAGNOSIS — Z85828 Personal history of other malignant neoplasm of skin: Secondary | ICD-10-CM

## 2019-04-21 DIAGNOSIS — J309 Allergic rhinitis, unspecified: Secondary | ICD-10-CM

## 2019-04-21 DIAGNOSIS — E7800 Pure hypercholesterolemia, unspecified: Secondary | ICD-10-CM | POA: Diagnosis not present

## 2019-04-21 DIAGNOSIS — M6289 Other specified disorders of muscle: Secondary | ICD-10-CM

## 2019-04-21 DIAGNOSIS — N9489 Other specified conditions associated with female genital organs and menstrual cycle: Secondary | ICD-10-CM

## 2019-04-21 DIAGNOSIS — L989 Disorder of the skin and subcutaneous tissue, unspecified: Secondary | ICD-10-CM

## 2019-04-21 LAB — LIPID PANEL
Cholesterol: 179 mg/dL (ref 0–200)
HDL: 54.9 mg/dL (ref >39.00)
LDL Cholesterol: 97 mg/dL (ref 0–99)
NonHDL: 123.86
Total CHOL/HDL Ratio: 3
Triglycerides: 135 mg/dL (ref 0.0–149.0)
VLDL: 27 mg/dL (ref 0.0–40.0)

## 2019-04-21 LAB — HEMOGLOBIN A1C: Hgb A1c MFr Bld: 6.3 % (ref 4.6–6.5)

## 2019-04-21 LAB — VITAMIN D 25 HYDROXY (VIT D DEFICIENCY, FRACTURES): VITD: 33.46 ng/mL (ref 30.00–100.00)

## 2019-04-21 NOTE — Addendum Note (Signed)
Addended by: Clarene Reamer B on: 04/21/2019 11:04 AM   Modules accepted: Orders

## 2019-04-21 NOTE — Progress Notes (Signed)
Subjective:    Patient ID: Erin Petersen, female    DOB: 04/22/1940, 79 y.o.   MRN: [id number]  HPI Chief Complaint  Patient presents with  . Annual Exam    Referral to Dermatologist / Stomach ulcer dx by GI - discuss BP meds /  Muscle stiffness/body soreness.   This is a 79 year old female who presents today for follow-up of chronic medical conditions and with the above chief complaints.  Skin issues-  Prior skin cancer on face. Has lesion on left lower leg. Needs referral to dermatology.   GI concerns-recently been diagnosed with duodenal ulcer by Dr. Marius Ditch.  She was put on omeprazole 40 mg twice daily for 3 months. Has been off meloxicam.   Stiffness, pain- muscles, worse in am. Was taken off meloxicam and noticed increased symptoms. Unable to walk which is her preferred exercise due to plantar fasciitis of left foot and is currently wearing the boot.  Diet recall- quiche for breakfast, deviled eggs. Salad lunch. Dinner is typically spaghetti squash, meatballs; rotisserie chicken on sandwich thins. Has increased vegetables. Eats fish, eggs, proteins. Premier protein shakes as snack. Water. No soda or sweet tea or juice. Snacks in the evenings.  Can struggle with portion control/boredom.  IC- saw urologist. Going to Dr. Amalia Hailey. Right side of vagina with discomfort on exam. Small bladder.  Was diagnosed with high tone pelvic floor dysfunction, neuralgia of both pudendal nerves and interstitial cystitis.  Pelvic floor physical therapy was recommended but patient was unable to go to [facility], [facility], [facility] for this. Taking hydroxyzine at bedtime which has significantly improved her sleep and decreased her nocturia.   Left foot- plantar fasciitis. Wearing a boot for 3 weeks.   Review of Systems Per HPI    Objective:   Physical Exam Vitals reviewed.  Constitutional:      General: She is not in acute distress.    Appearance: Normal appearance. She is obese. She is not ill-appearing, toxic-appearing  or diaphoretic.     Comments: Appears younger than stated age.   HENT:     Head: Normocephalic and atraumatic.  Cardiovascular:     Rate and Rhythm: Normal rate.  Pulmonary:     Effort: Pulmonary effort is normal.  Skin:    General: Skin is warm and dry.  Neurological:     Mental Status: She is alert and oriented to person, place, and time.  Psychiatric:        Mood and Affect: Mood normal.        Behavior: Behavior normal.        Thought Content: Thought content normal.        Judgment: Judgment normal.       BP 124/76 (BP Location: Left Arm, Patient Position: Sitting, Cuff Size: Normal)   Pulse 72   Temp 98 F (36.7 C) (Temporal)   Ht 4' 11"  (1.499 m)   Wt 167 lb 6.4 oz (75.9 kg)   LMP  (LMP Unknown)   SpO2 98%   BMI 33.81 kg/m  Wt Readings from Last 3 Encounters:  04/21/19 167 lb 6.4 oz (75.9 kg)  04/17/19 160 lb (72.6 kg)  04/15/19 161 lb (73 kg)       Assessment & Plan:  1. Prediabetes - reviewed diet recall, encouraged increased activity with chair exercises while wearing boot, continue to watch carbs - Hemoglobin A1c  2. High cholesterol - not at goal, simvastatin increased from 10-20 - Lipid panel - simvastatin (ZOCOR) 20 MG tablet; Take 0.5  tablets (10 mg total) by mouth at bedtime.  Dispense: 90 tablet; Refill: 3  3. Vitamin D deficiency - VITAMIN D 25 Hydroxy (Vit-D Deficiency, Fractures)  4. Estrogen deficiency - VITAMIN D 25 Hydroxy (Vit-D Deficiency, Fractures)  5. Urinary frequency - continued follow up with urology, pelvic floor PT  6. High-tone pelvic floor dysfunction in female - Ambulatory referral to Physical Therapy  7. Interstitial cystitis - Ambulatory referral to Physical Therapy  8. Neuralgia of both pudendal nerves - Ambulatory referral to Physical Therapy  9. History of skin cancer - Ambulatory referral to Dermatology  10. Skin lesion of left lower extremity - Ambulatory referral to Dermatology  11. Allergic rhinitis,  unspecified seasonality, unspecified trigger - fluticasone (FLONASE) 50 MCG/ACT nasal spray; Place 2 sprays into both nostrils daily.  Dispense: 16 g; Refill: 3  12. SOB (shortness of breath) on exertion - albuterol (VENTOLIN HFA) 108 (90 Base) MCG/ACT inhaler; Inhale 2 puffs into the lungs every 4 (four) hours as needed for wheezing or shortness of breath (cough, shortness of breath or wheezing.).  Dispense: 16 g; Refill: 1  13. Restless legs syndrome - rOPINIRole (REQUIP) 0.5 MG tablet; Take 1-3 tablets (0.5-1.5 mg total) by mouth See admin instructions. Take 1 tablet (0.5 mg) by mouth in the morning, 1 tablet (0.5 mg) by mouth in the afternoon, & take 3 tablets (1.5 mg) by mouth at bedtime.  Dispense: 450 tablet; Refill: 3  14. Non-seasonal allergic rhinitis, unspecified trigger - continue fluticasone nasal spray  15. Bee sting allergy - needs refill of epi pen, hers has expired - EPINEPHrine 0.3 mg/0.3 mL IJ SOAJ injection; Inject 0.3 mLs (0.3 mg total) into the muscle as needed for anaphylaxis.  Dispense: 1 each; Refill: 1  This visit occurred during the SARS-CoV-2 public health emergency.  Safety protocols were in place, including screening questions prior to the visit, additional usage of staff PPE, and extensive cleaning of exam room while observing appropriate contact time as indicated for disinfecting solutions.    Clarene Reamer, FNP-BC  [facility]  04/22/2019 3:00 PM

## 2019-04-21 NOTE — Telephone Encounter (Signed)
Pt seen today in office. Not filled by our office prior.   Please advise, thanks.

## 2019-04-21 NOTE — Patient Instructions (Addendum)
Work on anti- inflammation diet-  No grains or sugars  Avoid seed oils.  Use olive oil, grass fed butter, avocado oil and coconut  Look on Youtube for seated yoga, seated exercises for seniors  Follow up in 6 months

## 2019-04-22 ENCOUNTER — Encounter: Payer: Self-pay | Admitting: Family Medicine

## 2019-04-22 ENCOUNTER — Other Ambulatory Visit (INDEPENDENT_AMBULATORY_CARE_PROVIDER_SITE_OTHER): Payer: Medicare Other

## 2019-04-22 DIAGNOSIS — Z85828 Personal history of other malignant neoplasm of skin: Secondary | ICD-10-CM | POA: Insufficient documentation

## 2019-04-22 DIAGNOSIS — E119 Type 2 diabetes mellitus without complications: Secondary | ICD-10-CM | POA: Insufficient documentation

## 2019-04-22 DIAGNOSIS — K269 Duodenal ulcer, unspecified as acute or chronic, without hemorrhage or perforation: Secondary | ICD-10-CM | POA: Diagnosis not present

## 2019-04-22 DIAGNOSIS — R7303 Prediabetes: Secondary | ICD-10-CM | POA: Insufficient documentation

## 2019-04-22 DIAGNOSIS — E2839 Other primary ovarian failure: Secondary | ICD-10-CM | POA: Insufficient documentation

## 2019-04-22 DIAGNOSIS — R350 Frequency of micturition: Secondary | ICD-10-CM | POA: Insufficient documentation

## 2019-04-22 LAB — CBC WITH DIFFERENTIAL/PLATELET
Basophils Absolute: 0.1 10*3/uL (ref 0.0–0.1)
Basophils Relative: 0.8 % (ref 0.0–3.0)
Eosinophils Absolute: 0.2 10*3/uL (ref 0.0–0.7)
Eosinophils Relative: 2.1 % (ref 0.0–5.0)
HCT: 41.2 % (ref 36.0–46.0)
Hemoglobin: 13.6 g/dL (ref 12.0–15.0)
Lymphocytes Relative: 29.7 % (ref 12.0–46.0)
Lymphs Abs: 2.3 10*3/uL (ref 0.7–4.0)
MCHC: 33.1 g/dL (ref 30.0–36.0)
MCV: 83.2 fl (ref 78.0–100.0)
Monocytes Absolute: 0.8 10*3/uL (ref 0.1–1.0)
Monocytes Relative: 10.8 % (ref 3.0–12.0)
Neutro Abs: 4.4 10*3/uL (ref 1.4–7.7)
Neutrophils Relative %: 56.6 % (ref 43.0–77.0)
Platelets: 365 10*3/uL (ref 150.0–400.0)
RBC: 4.95 Mil/uL (ref 3.87–5.11)
RDW: 15.7 % — ABNORMAL HIGH (ref 11.5–15.5)
WBC: 7.8 10*3/uL (ref 4.0–10.5)

## 2019-04-22 LAB — IBC PANEL
Iron: 58 ug/dL (ref 42–145)
Saturation Ratios: 15 % — ABNORMAL LOW (ref 20.0–50.0)
Transferrin: 276 mg/dL (ref 212.0–360.0)

## 2019-04-22 LAB — FERRITIN: Ferritin: 16.8 ng/mL (ref 10.0–291.0)

## 2019-04-22 LAB — VITAMIN B12: Vitamin B-12: 457 pg/mL (ref 211–911)

## 2019-04-22 LAB — FOLATE: Folate: 20.7 ng/mL (ref >5.9)

## 2019-04-22 MED ORDER — ALBUTEROL SULFATE HFA 108 (90 BASE) MCG/ACT IN AERS: 2.0000 | INHALATION_SPRAY | RESPIRATORY_TRACT | 1 refills | Status: DC | PRN

## 2019-04-22 MED ORDER — EPINEPHRINE 0.3 MG/0.3ML IJ SOAJ: 0.3000 mg | INTRAMUSCULAR | 1 refills | Status: DC | PRN

## 2019-04-22 MED ORDER — ROPINIROLE HCL 0.5 MG PO TABS: 0.5000 mg | ORAL_TABLET | ORAL | 3 refills | Status: DC

## 2019-04-22 MED ORDER — SIMVASTATIN 20 MG PO TABS: 10.0000 mg | ORAL_TABLET | Freq: Every day | ORAL | 3 refills | Status: DC

## 2019-04-22 MED ORDER — FLUTICASONE PROPIONATE 50 MCG/ACT NA SUSP: 2.0000 | Freq: Every day | NASAL | 3 refills | Status: DC

## 2019-04-22 MED ORDER — AMLODIPINE BESYLATE 5 MG PO TABS: 5.0000 mg | ORAL_TABLET | Freq: Every day | ORAL | 3 refills | Status: DC

## 2019-04-25 ENCOUNTER — Telehealth: Payer: Self-pay

## 2019-04-25 ENCOUNTER — Encounter: Payer: Self-pay | Admitting: Gastroenterology

## 2019-04-25 MED ORDER — LINACLOTIDE 72 MCG PO CAPS: 72.0000 ug | ORAL_CAPSULE | Freq: Every day | ORAL | 0 refills | Status: DC

## 2019-04-25 NOTE — Telephone Encounter (Signed)
Patient verbalized  understanding. Patient states she wants medication sent to her Mail order pharmacy for a 90 day supply. Sent medication to the pharmacy

## 2019-04-25 NOTE — Telephone Encounter (Signed)
-----   Message from [name], MD sent at 04/25/2019  9:19 AM EST ----- Yes, she can  I don't think she was on it before Will try 38mg daily given her age  [name] Please send it in or she can pick up samples Let her know  Thanks  RV ----- Message ----- From: GElby Beck FNP Sent: 04/24/2019   7:03 PM EST To: RLin Landsman MD  FYI- I had these labs drawn that you had ordered when patient was in the office to save her a trip/ stick. She was wanting to know about going back on Linzess for her constipation? Thanks!

## 2019-04-27 ENCOUNTER — Other Ambulatory Visit: Payer: Self-pay | Admitting: Gastroenterology

## 2019-04-27 DIAGNOSIS — R1031 Right lower quadrant pain: Secondary | ICD-10-CM

## 2019-04-27 DIAGNOSIS — G8929 Other chronic pain: Secondary | ICD-10-CM

## 2019-04-28 NOTE — Telephone Encounter (Signed)
Are we prescribing Lyrica for this patient. I see on Patient Medication list but it says historical medication

## 2019-04-29 ENCOUNTER — Telehealth: Payer: Self-pay

## 2019-04-29 ENCOUNTER — Other Ambulatory Visit: Payer: Self-pay | Admitting: Family Medicine

## 2019-04-29 MED ORDER — PREGABALIN 150 MG PO CAPS: 150.0000 mg | ORAL_CAPSULE | Freq: Two times a day (BID) | ORAL | 0 refills | Status: DC

## 2019-04-29 NOTE — Telephone Encounter (Signed)
Please tell patient that I have sent 10 day of Lyrica to her pharmacy. I am looking into her other question regarding Prolia and will let her know what I find out.

## 2019-04-29 NOTE — Telephone Encounter (Signed)
[name] Rx: Pregabalin 150 mb bid won't arrive until March 8 or 10th.  She has been cutting down her dose to make it last but she only has enough for tomorrow.  Can you send a script to [facility] for a 7 to 10 day supply?

## 2019-04-29 NOTE — Telephone Encounter (Signed)
Pt states didn't qualify for 1K grant from [facility] and feels she can't afford Prolia.  Pt wants to know your thoughts on going back on Fosamax.  She had endoscopy done (report in chart) and has a 20m ulcer.

## 2019-04-29 NOTE — Telephone Encounter (Signed)
Pt aware that Lyrica at pharmacy.   Pts concern/question was if she can take Fosamax instead of Prolia - pt cannot afford Prolia at this time.  If so, Will Fosamax interfere with stomach ulcers?

## 2019-04-30 NOTE — Telephone Encounter (Signed)
Please call patient and let her know that I am checking with her gastroenterologist.

## 2019-05-01 NOTE — Telephone Encounter (Signed)
Patient aware. Will await our call back

## 2019-05-04 ENCOUNTER — Encounter: Payer: Self-pay | Admitting: Family Medicine

## 2019-05-05 NOTE — Telephone Encounter (Signed)
See mychart message for further details. Patient's gastroenterologist consulted and ok to put on bisphosphonate.

## 2019-05-05 NOTE — Telephone Encounter (Signed)
Sent patient mychart message

## 2019-05-06 ENCOUNTER — Encounter: Payer: Self-pay | Admitting: Family Medicine

## 2019-05-07 ENCOUNTER — Encounter: Payer: Self-pay | Admitting: Family Medicine

## 2019-05-07 NOTE — Telephone Encounter (Signed)
Pt is responding to your question about her Fosamax Elby Beck, FNP to Fredda Hammed  2:40 PM Do you want me to send generic Fosamax to your mail order pharmacy. Dr. Marius Ditch thought it would be fine. Jackelyn Poling

## 2019-05-09 ENCOUNTER — Other Ambulatory Visit: Payer: Self-pay | Admitting: Family Medicine

## 2019-05-09 DIAGNOSIS — M8580 Other specified disorders of bone density and structure, unspecified site: Secondary | ICD-10-CM

## 2019-05-09 MED ORDER — ALENDRONATE SODIUM 70 MG PO TABS: 70.0000 mg | ORAL_TABLET | ORAL | 4 refills | Status: DC

## 2019-05-09 NOTE — Progress Notes (Signed)
ale

## 2019-05-12 DIAGNOSIS — M7672 Peroneal tendinitis, left leg: Secondary | ICD-10-CM | POA: Diagnosis not present

## 2019-05-12 DIAGNOSIS — M76822 Posterior tibial tendinitis, left leg: Secondary | ICD-10-CM | POA: Diagnosis not present

## 2019-05-12 DIAGNOSIS — M722 Plantar fascial fibromatosis: Secondary | ICD-10-CM | POA: Diagnosis not present

## 2019-05-14 ENCOUNTER — Encounter: Payer: Self-pay | Admitting: Physical Therapy

## 2019-05-18 ENCOUNTER — Other Ambulatory Visit: Payer: Self-pay | Admitting: Family Medicine

## 2019-05-18 ENCOUNTER — Encounter: Payer: Self-pay | Admitting: Family Medicine

## 2019-05-18 MED ORDER — ALBUTEROL SULFATE HFA 108 (90 BASE) MCG/ACT IN AERS: 2.0000 | INHALATION_SPRAY | Freq: Four times a day (QID) | RESPIRATORY_TRACT | 2 refills | Status: DC | PRN

## 2019-05-19 DIAGNOSIS — M1711 Unilateral primary osteoarthritis, right knee: Secondary | ICD-10-CM | POA: Diagnosis not present

## 2019-05-28 DIAGNOSIS — M9903 Segmental and somatic dysfunction of lumbar region: Secondary | ICD-10-CM | POA: Diagnosis not present

## 2019-05-28 DIAGNOSIS — M722 Plantar fascial fibromatosis: Secondary | ICD-10-CM | POA: Diagnosis not present

## 2019-05-28 DIAGNOSIS — M25561 Pain in right knee: Secondary | ICD-10-CM | POA: Diagnosis not present

## 2019-05-28 DIAGNOSIS — M9906 Segmental and somatic dysfunction of lower extremity: Secondary | ICD-10-CM | POA: Diagnosis not present

## 2019-06-02 ENCOUNTER — Encounter: Payer: Self-pay | Admitting: Family Medicine

## 2019-06-02 NOTE — Telephone Encounter (Signed)
Called and spoke to patient. Feels a little better today. Feels like she has a virus/ flu. She will touch base with me tomorrow to let me know how she is doing. ER/UC precautions reviewed.

## 2019-06-02 NOTE — Telephone Encounter (Signed)
Please advise, thanks.  I sent a message back for further information about symptoms.

## 2019-06-03 ENCOUNTER — Emergency Department: Payer: Medicare Other

## 2019-06-03 ENCOUNTER — Inpatient Hospital Stay
Admission: EM | Admit: 2019-06-03 | Discharge: 2019-06-07 | DRG: 690 | Disposition: A | Discharge status: Home / Self Care | Payer: Medicare Other | Attending: Internal Medicine | Admitting: Internal Medicine

## 2019-06-03 ENCOUNTER — Other Ambulatory Visit: Payer: Self-pay

## 2019-06-03 ENCOUNTER — Encounter: Payer: Self-pay | Admitting: Family Medicine

## 2019-06-03 ENCOUNTER — Ambulatory Visit: Payer: Medicare Other | Attending: Family Medicine | Admitting: Physical Therapy

## 2019-06-03 ENCOUNTER — Telehealth: Payer: Self-pay

## 2019-06-03 DIAGNOSIS — G629 Polyneuropathy, unspecified: Secondary | ICD-10-CM | POA: Diagnosis present

## 2019-06-03 DIAGNOSIS — B9689 Other specified bacterial agents as the cause of diseases classified elsewhere: Secondary | ICD-10-CM | POA: Diagnosis present

## 2019-06-03 DIAGNOSIS — E876 Hypokalemia: Secondary | ICD-10-CM

## 2019-06-03 DIAGNOSIS — G8929 Other chronic pain: Secondary | ICD-10-CM | POA: Insufficient documentation

## 2019-06-03 DIAGNOSIS — A419 Sepsis, unspecified organism: Secondary | ICD-10-CM | POA: Diagnosis not present

## 2019-06-03 DIAGNOSIS — I4581 Long QT syndrome: Secondary | ICD-10-CM

## 2019-06-03 DIAGNOSIS — E860 Dehydration: Secondary | ICD-10-CM | POA: Diagnosis present

## 2019-06-03 DIAGNOSIS — G4730 Sleep apnea, unspecified: Secondary | ICD-10-CM | POA: Diagnosis present

## 2019-06-03 DIAGNOSIS — R2689 Other abnormalities of gait and mobility: Secondary | ICD-10-CM | POA: Insufficient documentation

## 2019-06-03 DIAGNOSIS — E861 Hypovolemia: Secondary | ICD-10-CM | POA: Diagnosis present

## 2019-06-03 DIAGNOSIS — Z6834 Body mass index (BMI) 34.0-34.9, adult: Secondary | ICD-10-CM

## 2019-06-03 DIAGNOSIS — Z79899 Other long term (current) drug therapy: Secondary | ICD-10-CM

## 2019-06-03 DIAGNOSIS — Z90710 Acquired absence of both cervix and uterus: Secondary | ICD-10-CM

## 2019-06-03 DIAGNOSIS — R7881 Bacteremia: Secondary | ICD-10-CM | POA: Diagnosis present

## 2019-06-03 DIAGNOSIS — Z981 Arthrodesis status: Secondary | ICD-10-CM

## 2019-06-03 DIAGNOSIS — K529 Noninfective gastroenteritis and colitis, unspecified: Secondary | ICD-10-CM | POA: Diagnosis not present

## 2019-06-03 DIAGNOSIS — K219 Gastro-esophageal reflux disease without esophagitis: Secondary | ICD-10-CM | POA: Diagnosis present

## 2019-06-03 DIAGNOSIS — G4733 Obstructive sleep apnea (adult) (pediatric): Secondary | ICD-10-CM | POA: Diagnosis present

## 2019-06-03 DIAGNOSIS — I10 Essential (primary) hypertension: Secondary | ICD-10-CM | POA: Diagnosis not present

## 2019-06-03 DIAGNOSIS — Z20822 Contact with and (suspected) exposure to covid-19: Secondary | ICD-10-CM | POA: Diagnosis present

## 2019-06-03 DIAGNOSIS — Z811 Family history of alcohol abuse and dependence: Secondary | ICD-10-CM

## 2019-06-03 DIAGNOSIS — Z974 Presence of external hearing-aid: Secondary | ICD-10-CM

## 2019-06-03 DIAGNOSIS — M2170 Unequal limb length (acquired), unspecified site: Secondary | ICD-10-CM | POA: Insufficient documentation

## 2019-06-03 DIAGNOSIS — Z8719 Personal history of other diseases of the digestive system: Secondary | ICD-10-CM

## 2019-06-03 DIAGNOSIS — R509 Fever, unspecified: Secondary | ICD-10-CM | POA: Diagnosis not present

## 2019-06-03 DIAGNOSIS — E785 Hyperlipidemia, unspecified: Secondary | ICD-10-CM | POA: Diagnosis present

## 2019-06-03 DIAGNOSIS — E871 Hypo-osmolality and hyponatremia: Secondary | ICD-10-CM | POA: Diagnosis not present

## 2019-06-03 DIAGNOSIS — N3010 Interstitial cystitis (chronic) without hematuria: Secondary | ICD-10-CM | POA: Diagnosis present

## 2019-06-03 DIAGNOSIS — E7800 Pure hypercholesterolemia, unspecified: Secondary | ICD-10-CM | POA: Diagnosis present

## 2019-06-03 DIAGNOSIS — M5136 Other intervertebral disc degeneration, lumbar region: Secondary | ICD-10-CM | POA: Diagnosis present

## 2019-06-03 DIAGNOSIS — B9620 Unspecified Escherichia coli [E. coli] as the cause of diseases classified elsewhere: Secondary | ICD-10-CM | POA: Diagnosis present

## 2019-06-03 DIAGNOSIS — J45990 Exercise induced bronchospasm: Secondary | ICD-10-CM | POA: Diagnosis present

## 2019-06-03 DIAGNOSIS — R109 Unspecified abdominal pain: Secondary | ICD-10-CM | POA: Diagnosis not present

## 2019-06-03 DIAGNOSIS — R197 Diarrhea, unspecified: Secondary | ICD-10-CM | POA: Diagnosis not present

## 2019-06-03 DIAGNOSIS — M1990 Unspecified osteoarthritis, unspecified site: Secondary | ICD-10-CM | POA: Diagnosis present

## 2019-06-03 DIAGNOSIS — G2581 Restless legs syndrome: Secondary | ICD-10-CM | POA: Diagnosis present

## 2019-06-03 DIAGNOSIS — M722 Plantar fascial fibromatosis: Secondary | ICD-10-CM | POA: Insufficient documentation

## 2019-06-03 DIAGNOSIS — Z823 Family history of stroke: Secondary | ICD-10-CM

## 2019-06-03 DIAGNOSIS — M4125 Other idiopathic scoliosis, thoracolumbar region: Secondary | ICD-10-CM | POA: Insufficient documentation

## 2019-06-03 DIAGNOSIS — N12 Tubulo-interstitial nephritis, not specified as acute or chronic: Secondary | ICD-10-CM | POA: Diagnosis not present

## 2019-06-03 DIAGNOSIS — J45909 Unspecified asthma, uncomplicated: Secondary | ICD-10-CM | POA: Diagnosis present

## 2019-06-03 DIAGNOSIS — U071 COVID-19: Secondary | ICD-10-CM

## 2019-06-03 DIAGNOSIS — Z803 Family history of malignant neoplasm of breast: Secondary | ICD-10-CM

## 2019-06-03 DIAGNOSIS — Z7982 Long term (current) use of aspirin: Secondary | ICD-10-CM

## 2019-06-03 DIAGNOSIS — Z825 Family history of asthma and other chronic lower respiratory diseases: Secondary | ICD-10-CM

## 2019-06-03 DIAGNOSIS — Z7951 Long term (current) use of inhaled steroids: Secondary | ICD-10-CM

## 2019-06-03 DIAGNOSIS — N281 Cyst of kidney, acquired: Secondary | ICD-10-CM | POA: Diagnosis present

## 2019-06-03 DIAGNOSIS — I959 Hypotension, unspecified: Secondary | ICD-10-CM | POA: Diagnosis not present

## 2019-06-03 DIAGNOSIS — R7303 Prediabetes: Secondary | ICD-10-CM | POA: Diagnosis present

## 2019-06-03 DIAGNOSIS — E669 Obesity, unspecified: Secondary | ICD-10-CM | POA: Diagnosis present

## 2019-06-03 DIAGNOSIS — M25561 Pain in right knee: Secondary | ICD-10-CM | POA: Insufficient documentation

## 2019-06-03 LAB — CBC
HCT: 36.1 % (ref 36.0–46.0)
Hemoglobin: 12.1 g/dL (ref 12.0–15.0)
MCH: 27.4 pg (ref 26.0–34.0)
MCHC: 33.5 g/dL (ref 30.0–36.0)
MCV: 81.9 fL (ref 80.0–100.0)
Platelets: 226 10*3/uL (ref 150–400)
RBC: 4.41 MIL/uL (ref 3.87–5.11)
RDW: 13.9 % (ref 11.5–15.5)
WBC: 10.7 10*3/uL — ABNORMAL HIGH (ref 4.0–10.5)
nRBC: 0 % (ref 0.0–0.2)

## 2019-06-03 LAB — COMPREHENSIVE METABOLIC PANEL
ALT: 32 U/L (ref 0–44)
AST: 28 U/L (ref 15–41)
Albumin: 3.3 g/dL — ABNORMAL LOW (ref 3.5–5.0)
Alkaline Phosphatase: 75 U/L (ref 38–126)
Anion gap: 13 (ref 5–15)
BUN: 27 mg/dL — ABNORMAL HIGH (ref 8–23)
CO2: 25 mmol/L (ref 22–32)
Calcium: 7.8 mg/dL — ABNORMAL LOW (ref 8.9–10.3)
Chloride: 92 mmol/L — ABNORMAL LOW (ref 98–111)
Creatinine, Ser: 1.03 mg/dL — ABNORMAL HIGH (ref 0.44–1.00)
GFR calc Af Amer: >60 mL/min (ref >60)
GFR calc non Af Amer: 52 mL/min — ABNORMAL LOW (ref >60)
Glucose, Bld: 131 mg/dL — ABNORMAL HIGH (ref 70–99)
Potassium: 2.5 mmol/L — CL (ref 3.5–5.1)
Sodium: 130 mmol/L — ABNORMAL LOW (ref 135–145)
Total Bilirubin: 0.8 mg/dL (ref 0.3–1.2)
Total Protein: 6.7 g/dL (ref 6.5–8.1)

## 2019-06-03 LAB — GASTROINTESTINAL PANEL BY PCR, STOOL (REPLACES STOOL CULTURE)

## 2019-06-03 LAB — POC SARS CORONAVIRUS 2 AG: SARS Coronavirus 2 Ag: NEGATIVE

## 2019-06-03 LAB — URINALYSIS, COMPLETE (UACMP) WITH MICROSCOPIC
Bilirubin Urine: NEGATIVE
Glucose, UA: NEGATIVE mg/dL
Ketones, ur: NEGATIVE mg/dL
Nitrite: NEGATIVE
Protein, ur: 30 mg/dL — AB
Specific Gravity, Urine: 1.013 (ref 1.005–1.030)
pH: 5 (ref 5.0–8.0)

## 2019-06-03 LAB — C DIFFICILE QUICK SCREEN W PCR REFLEX
C Diff antigen: NEGATIVE
C Diff interpretation: NOT DETECTED
C Diff toxin: NEGATIVE

## 2019-06-03 LAB — MAGNESIUM: Magnesium: 1.8 mg/dL (ref 1.7–2.4)

## 2019-06-03 LAB — LACTIC ACID, PLASMA: Lactic Acid, Venous: 0.9 mmol/L (ref 0.5–1.9)

## 2019-06-03 LAB — LIPASE, BLOOD: Lipase: 17 U/L (ref 11–51)

## 2019-06-03 LAB — PROCALCITONIN: Procalcitonin: 83.89 ng/mL

## 2019-06-03 MED ORDER — POTASSIUM CHLORIDE IN NACL 40-0.9 MEQ/L-% IV SOLN
INTRAVENOUS | Status: AC
  Administered 2019-06-04: 01:00:00 100 mL/h via INTRAVENOUS
  Filled 2019-06-03 (×2): qty 1000

## 2019-06-03 MED ORDER — POTASSIUM CHLORIDE 20 MEQ PO PACK
40.0000 meq | PACK | Freq: Once | ORAL | Status: AC
  Administered 2019-06-03: 40 meq via ORAL
  Filled 2019-06-03: qty 2

## 2019-06-03 MED ORDER — IOHEXOL 9 MG/ML PO SOLN
500.0000 mL | ORAL | Status: AC
  Administered 2019-06-03: 500 mL via ORAL

## 2019-06-03 MED ORDER — POTASSIUM CHLORIDE 20 MEQ/15ML (10%) PO SOLN
40.0000 meq | Freq: Once | ORAL | Status: AC
  Administered 2019-06-04: 40 meq via ORAL
  Filled 2019-06-03: qty 30

## 2019-06-03 MED ORDER — ONDANSETRON 4 MG PO TBDP
4.0000 mg | ORAL_TABLET | Freq: Once | ORAL | Status: AC | PRN
  Administered 2019-06-03: 17:00:00 4 mg via ORAL
  Filled 2019-06-03: qty 1

## 2019-06-03 MED ORDER — SODIUM CHLORIDE 0.9 % IV BOLUS
500.0000 mL | Freq: Once | INTRAVENOUS | Status: AC
  Administered 2019-06-03: 500 mL via INTRAVENOUS

## 2019-06-03 MED ORDER — ENOXAPARIN SODIUM 40 MG/0.4ML ~~LOC~~ SOLN
40.0000 mg | SUBCUTANEOUS | Status: DC
  Administered 2019-06-04 – 2019-06-06 (×4): 40 mg via SUBCUTANEOUS
  Filled 2019-06-03 (×5): qty 0.4

## 2019-06-03 MED ORDER — SODIUM CHLORIDE 0.9% FLUSH
3.0000 mL | Freq: Two times a day (BID) | INTRAVENOUS | Status: DC
  Administered 2019-06-04 – 2019-06-07 (×7): 3 mL via INTRAVENOUS

## 2019-06-03 MED ORDER — MAGNESIUM SULFATE IN D5W 1-5 GM/100ML-% IV SOLN
1.0000 g | Freq: Once | INTRAVENOUS | Status: AC
  Administered 2019-06-04: 1 g via INTRAVENOUS
  Filled 2019-06-03: qty 100

## 2019-06-03 MED ORDER — ACETAMINOPHEN 325 MG PO TABS
ORAL_TABLET | ORAL | Status: AC
  Filled 2019-06-03: qty 2

## 2019-06-03 MED ORDER — ACETAMINOPHEN 650 MG RE SUPP: 650.0000 mg | Freq: Four times a day (QID) | RECTAL | Status: DC | PRN

## 2019-06-03 MED ORDER — SODIUM CHLORIDE 0.9 % IV SOLN
1.0000 g | INTRAVENOUS | Status: DC
  Filled 2019-06-03: qty 10

## 2019-06-03 MED ORDER — SIMVASTATIN 10 MG PO TABS
10.0000 mg | ORAL_TABLET | Freq: Every day | ORAL | Status: DC
  Administered 2019-06-04 – 2019-06-06 (×4): 10 mg via ORAL
  Filled 2019-06-03 (×5): qty 1

## 2019-06-03 MED ORDER — ACETAMINOPHEN 325 MG PO TABS
650.0000 mg | ORAL_TABLET | Freq: Four times a day (QID) | ORAL | Status: DC | PRN
  Administered 2019-06-03 – 2019-06-04 (×3): 650 mg via ORAL
  Filled 2019-06-03 (×2): qty 2

## 2019-06-03 MED ORDER — POTASSIUM CHLORIDE 10 MEQ/100ML IV SOLN
10.0000 meq | Freq: Once | INTRAVENOUS | Status: AC
  Administered 2019-06-03: 20:00:00 10 meq via INTRAVENOUS
  Filled 2019-06-03: qty 100

## 2019-06-03 MED ORDER — BUDESONIDE 0.25 MG/2ML IN SUSP
2.0000 mL | Freq: Two times a day (BID) | RESPIRATORY_TRACT | Status: DC
  Administered 2019-06-04 – 2019-06-07 (×7): 0.25 mg via RESPIRATORY_TRACT
  Filled 2019-06-03 (×7): qty 2

## 2019-06-03 MED ORDER — ONDANSETRON HCL 4 MG/2ML IJ SOLN: 4.0000 mg | Freq: Four times a day (QID) | INTRAMUSCULAR | Status: DC | PRN

## 2019-06-03 MED ORDER — ACETAMINOPHEN 325 MG PO TABS: 650.0000 mg | ORAL_TABLET | Freq: Four times a day (QID) | ORAL | Status: DC | PRN

## 2019-06-03 MED ORDER — SODIUM CHLORIDE 0.9 % IV SOLN
1.0000 g | Freq: Once | INTRAVENOUS | Status: AC
  Administered 2019-06-03: 1 g via INTRAVENOUS
  Filled 2019-06-03: qty 10

## 2019-06-03 MED ORDER — IOHEXOL 300 MG/ML  SOLN
100.0000 mL | Freq: Once | INTRAMUSCULAR | Status: AC | PRN
  Administered 2019-06-03: 100 mL via INTRAVENOUS

## 2019-06-03 MED ORDER — LOPERAMIDE HCL 2 MG PO CAPS
2.0000 mg | ORAL_CAPSULE | ORAL | Status: DC | PRN
  Administered 2019-06-04 – 2019-06-05 (×2): 2 mg via ORAL
  Filled 2019-06-03 (×2): qty 1

## 2019-06-03 MED ORDER — ONDANSETRON HCL 4 MG PO TABS: 4.0000 mg | ORAL_TABLET | Freq: Four times a day (QID) | ORAL | Status: DC | PRN

## 2019-06-03 NOTE — Telephone Encounter (Signed)
Noted  

## 2019-06-03 NOTE — ED Provider Notes (Signed)
[facility] Emergency Department Provider Note   ____________________________________________   First MD Initiated Contact with Patient 06/03/19 1841     (approximate)  I have reviewed the triage vital signs and the nursing notes.   HISTORY  Chief Complaint Fever and Diarrhea    HPI Erin Petersen is a 79 y.o. female history of prediabetes, urinary tract infection, hyperlipidemia hypertension  Patient presents today states since Sunday she been having pretty severe body aches chills intermittent fluctuating fevers nausea.  Yesterday she had diarrhea with several loose watery stools and nausea decreased appetite, but able to eat a little bit of Jell-O today and has not been vomiting  Not really having any severe abdominal pain, but reports some soreness especially over the left.  She feels a little short of breath but has not been coughing  Reports she received a completed Covid vaccine series at the end of January  Denies known Covid exposure  She is also been having a mild throbbing headache for the last day.  She feels dry and dehydrated   Past Medical History:  Diagnosis Date  . Arthritis   . Asthma    exercise induced asthma  . Colon polyps   . Dyspnea   . GERD (gastroesophageal reflux disease)   . Hyperlipidemia   . Hypertension   . Neuropathy    right leg and foot  . Plantar fasciitis   . Prediabetes   . RLS (restless legs syndrome)   . Sleep apnea    CPAP  . SOB (shortness of breath)   . Urinary incontinence   . Urinary tract infection   . Wears hearing aid in right ear     Patient Active Problem List   Diagnosis Date Noted  . Prediabetes 04/22/2019  . Estrogen deficiency 04/22/2019  . Urinary frequency 04/22/2019  . History of skin cancer 04/22/2019  . Duodenal ulcer   . High-tone pelvic floor dysfunction 04/03/2019  . IC (interstitial cystitis) 04/03/2019  . Neuralgia of both pudendal nerves 04/03/2019  . Pelvic pain  in female 04/03/2019  . Osteoporosis 02/12/2019  . Asthma 01/31/2019  . Adverse reaction to influenza vaccine, initial encounter 12/03/2018  . Vitamin D deficiency 12/03/2018  . Restless legs syndrome 11/28/2018  . Small fiber neuropathy 11/28/2018  . RLQ abdominal pain   . Allergic rhinitis 10/08/2018  . Benign essential hypertension 10/08/2018  . Chronic bladder pain 10/08/2018  . Neuropathy 10/08/2018  . Osteoarthritis 10/08/2018  . OSA on CPAP 12/29/2016  . Shortness of breath 06/24/2012  . Other and unspecified disc disorder of lumbar region 10/25/2000  . High cholesterol 09/24/1998  . GERD (gastroesophageal reflux disease) 09/23/1988  . Tinnitus of both ears 09/24/1983    Past Surgical History:  Procedure Laterality Date  . ABDOMINAL HYSTERECTOMY  1972  . APPENDECTOMY  1958  . BACK SURGERY     lumbar fusion  . BILATERAL SALPINGOOPHORECTOMY  01/07/2018  . BIOPSY  04/15/2019   Procedure: BIOPSY;  Surgeon: [name], MD;  Location: [facility];  Service: Endoscopy;;  . CARDIAC CATHETERIZATION     over 10 yrs ago.  [place], [employer]  . COLONOSCOPY WITH PROPOFOL N/A 11/11/2018   Procedure: COLONOSCOPY WITH PROPOFOL;  Surgeon: [name], MD;  Location: [facility] ENDOSCOPY;  Service: Gastroenterology;  Laterality: N/A;  . CYSTO WITH HYDRODISTENSION N/A 12/30/2018   Procedure: CYSTOSCOPY/HYDRODISTENSION WITH MARCAINE;  Surgeon: Bjorn Loser, MD;  Location: [facility] ORS;  Service: Urology;  Laterality:  N/A;  . ESOPHAGOGASTRODUODENOSCOPY (EGD) WITH PROPOFOL N/A 04/15/2019   Procedure: ESOPHAGOGASTRODUODENOSCOPY (EGD) WITH PROPOFOL;  Surgeon: [name], MD;  Location: [place];  Service: Endoscopy;  Laterality: N/A;  sleep apnea  . HERNIA REPAIR     abd  . HERNIA REPAIR     umbilical  . INGUINAL HERNIA REPAIR Bilateral    inguinal  . LAPAROSCOPIC BILATERAL SALPINGO OOPHERECTOMY    . SPINAL FUSION  2002  . TONSILLECTOMY  AND ADENOIDECTOMY  1961    Prior to Admission medications   Medication Sig Start Date End Date Taking? Authorizing Provider  alendronate (FOSAMAX) 70 MG tablet Take 70 mg by mouth once a week. Take with a full glass of water on an empty stomach.   Yes [provider]  acetaminophen (TYLENOL) 650 MG CR tablet Take 1,300 mg by mouth at bedtime.    [provider]  albuterol (VENTOLIN HFA) 108 (90 Base) MCG/ACT inhaler Inhale 2 puffs into the lungs every 6 (six) hours as needed for wheezing or shortness of breath. 05/18/19   Elby Beck, FNP  amLODipine (NORVASC) 5 MG tablet Take 1 tablet (5 mg total) by mouth daily. 04/22/19   Elby Beck, FNP  aspirin 81 MG EC tablet Take 81 mg by mouth at bedtime.     [provider]  Cholecalciferol (VITAMIN D3) 50 MCG (2000 UT) TABS Take by mouth at bedtime.    [provider]  Cyanocobalamin (VITAMIN B-12) 3000 MCG SUBL Take 3,000 mcg by mouth daily.    [provider]  DIAZEPAM RE Place 10 mg rectally at bedtime.    [provider]  diclofenac sodium (VOLTAREN) 1 % GEL Apply 1 application topically 2 (two) times daily as needed (pain.).     [provider]  dicyclomine (BENTYL) 10 MG capsule TAKE 1 CAPSULE BY MOUTH AT  BEDTIME 04/29/19   Vanga, Tally Due, MD  EPINEPHrine 0.3 mg/0.3 mL IJ SOAJ injection Inject 0.3 mLs (0.3 mg total) into the muscle as needed for anaphylaxis. 04/22/19   Elby Beck, FNP  estradiol (ESTRACE) 0.1 MG/GM vaginal cream Place 1 Applicatorful vaginally 3 (three) times a week. Patient taking differently: Place 1 Applicatorful vaginally at bedtime.  10/09/18 10/09/19  Elby Beck, FNP  fluticasone (FLONASE) 50 MCG/ACT nasal spray Place 2 sprays into both nostrils daily. 04/22/19   Elby Beck, FNP  fluticasone (FLOVENT HFA) 44 MCG/ACT inhaler Inhale into the lungs. 01/31/19 01/31/20  [provider]  hydrochlorothiazide (HYDRODIURIL) 25  MG tablet TAKE 1 TABLET BY MOUTH  DAILY 04/21/19   Elby Beck, FNP  hydrOXYzine (ATARAX/VISTARIL) 25 MG tablet 1po  one hour before sleep 04/03/19   [provider]  lidocaine (XYLOCAINE) 2 % jelly Apply 1 application topically as needed (pain.).    [provider]  lidocaine (XYLOCAINE) 5 % ointment  04/03/19   [provider]  linaclotide (LINZESS) 72 MCG capsule Take 1 capsule (72 mcg total) by mouth daily before breakfast. 04/25/19   Vanga, Tally Due, MD  omeprazole (PRILOSEC) 40 MG capsule Take 1 capsule (40 mg total) by mouth 2 (two) times daily before a meal. 04/15/19 05/15/19  Vanga, Tally Due, MD  pentosan polysulfate (ELMIRON) 100 MG capsule Take by mouth. 04/03/19   [provider]  polyethylene glycol (MIRALAX / GLYCOLAX) 17 g packet Take 1 packet by mouth daily.     [provider]  pregabalin (LYRICA) 150 MG capsule Take 1 capsule (  150 mg total) by mouth 2 (two) times daily. 04/29/19   Elby Beck, FNP  rOPINIRole (REQUIP) 0.5 MG tablet Take 1-3 tablets (0.5-1.5 mg total) by mouth See admin instructions. Take 1 tablet (0.5 mg) by mouth in the morning, 1 tablet (0.5 mg) by mouth in the afternoon, & take 3 tablets (1.5 mg) by mouth at bedtime. 04/22/19   Elby Beck, FNP  simvastatin (ZOCOR) 20 MG tablet Take 0.5 tablets (10 mg total) by mouth at bedtime. 04/22/19   Elby Beck, FNP    Allergies Other, Ciprofloxacin hcl, and Sulfa antibiotics  Family History  Problem Relation Age of Onset  . Alcohol abuse Mother   . Cancer Mother   . Stroke Mother   . Alcohol abuse Father   . Arthritis Sister   . Arthritis Daughter   . COPD Daughter   . Arthritis Paternal Grandmother   . Asthma Paternal Grandmother   . COPD Paternal Grandmother   . Breast cancer Paternal Aunt     Social History Social History   Tobacco Use  . Smoking status: Never Smoker  . Smokeless tobacco: Never Used  Substance Use Topics  . Alcohol  use: Not Currently  . Drug use: Never    Review of Systems Constitutional: No fever/chills Eyes: No visual changes. ENT: No sore throat. Cardiovascular: Denies chest pain. Respiratory: A little short of breath over the last day Gastrointestinal: See HPI Genitourinary: Negative for dysuria. Musculoskeletal: Negative for back pain.  General muscle aches. Skin: Negative for rash. Neurological: Negative for areas of focal weakness or numbness.    ____________________________________________   PHYSICAL EXAM:  VITAL SIGNS: ED Triage Vitals  Enc Vitals Group     BP 06/03/19 1721 (!) 103/56     Pulse Rate 06/03/19 1721 92     Resp 06/03/19 1721 20     Temp 06/03/19 1721 (!) 100.7 F (38.2 C)     Temp Source 06/03/19 1721 Oral     SpO2 06/03/19 1721 95 %     Weight 06/03/19 1722 160 lb (72.6 kg)     Height 06/03/19 1722 4' 11"  (1.499 m)     Head Circumference --      Peak Flow --      Pain Score 06/03/19 1722 6     Pain Loc --      Pain Edu? --      Excl. in [place]? --     Constitutional: Alert and oriented.  Mildly ill-appearing but in no acute distress.  Transfer self from wheelchair to bed without difficulty Eyes: Conjunctivae are normal. Head: Atraumatic. Nose: No congestion/rhinnorhea. Mouth/Throat: Mucous membranes are dry. Neck: No stridor.  Cardiovascular: Normal rate, regular rhythm. Grossly normal heart sounds.  Good peripheral circulation. Respiratory: Normal respiratory effort.  No retractions. Lungs CTAB. Gastrointestinal: Soft and mild to moderate tenderness over the left side of the abdomen.  No rebound or guarding.  No right-sided abdominal tenderness. Musculoskeletal: No lower extremity tenderness nor edema. Neurologic:  Normal speech and language. No gross focal neurologic deficits are appreciated.  Skin:  Skin is warm, dry and intact.  Hot to touch.  No rash noted. Psychiatric: Mood and affect are normal. Speech and behavior are  normal.  ____________________________________________   LABS (all labs ordered are listed, but only abnormal results are displayed)  Labs Reviewed  COMPREHENSIVE METABOLIC PANEL - Abnormal; Notable for the following components:      Result Value   Sodium 130 (*)  Potassium 2.5 (*)    Chloride 92 (*)    Glucose, Bld 131 (*)    BUN 27 (*)    Creatinine, Ser 1.03 (*)    Calcium 7.8 (*)    Albumin 3.3 (*)    GFR calc non Af Amer 52 (*)    All other components within normal limits  CBC - Abnormal; Notable for the following components:   WBC 10.7 (*)    All other components within normal limits  URINALYSIS, COMPLETE (UACMP) WITH MICROSCOPIC - Abnormal; Notable for the following components:   Color, Urine YELLOW (*)    APPearance HAZY (*)    Hgb urine dipstick SMALL (*)    Protein, ur 30 (*)    Leukocytes,Ua SMALL (*)    Bacteria, UA MANY (*)    All other components within normal limits  GASTROINTESTINAL PANEL BY PCR, STOOL (REPLACES STOOL CULTURE)  C DIFFICILE QUICK SCREEN W PCR REFLEX  CULTURE, BLOOD (ROUTINE X 2)  CULTURE, BLOOD (ROUTINE X 2)  LIPASE, BLOOD  MAGNESIUM  PROCALCITONIN  LACTIC ACID, PLASMA  POC SARS CORONAVIRUS 2 AG -  ED  POC SARS CORONAVIRUS 2 AG   ____________________________________________  EKG  Reviewed inter by me at 1730 Heart rate 90 QRS 99 QTc 480 Normal sinus rhythm, slight slurring of the T waves and lengthening of QT appears possible in precordial leads  QTc appears slightly prolonged compared to previous   ____________________________________________  RADIOLOGY  CT ABDOMEN PELVIS W CONTRAST  Result Date: 06/03/2019 CLINICAL DATA:  Left-sided abdominal pain. Fever. Diarrhea. EXAM: CT ABDOMEN AND PELVIS WITH CONTRAST TECHNIQUE: Multidetector CT imaging of the abdomen and pelvis was performed using the standard protocol following bolus administration of intravenous contrast. CONTRAST:  155m OMNIPAQUE IOHEXOL 300 MG/ML  SOLN  COMPARISON:  None. FINDINGS: Lower chest: Normal. Hepatobiliary: No focal liver abnormality is seen. No gallstones, gallbladder wall thickening, or biliary dilatation. Pancreas: Unremarkable. No pancreatic ductal dilatation or surrounding inflammatory changes. Spleen: Normal in size without focal abnormality. Adrenals/Urinary Tract: There is perinephric soft tissue stranding adjacent to a slightly complex cyst on the lower pole right kidney with slight abnormal perfusion to the adjacent renal parenchyma. This lesion is approximately 3.2 cm in size. There is slight enhancement of the mucosa of the renal pelvis and the right ureter is slightly prominent with enhancement of the mucosa. The left kidney appears normal. Bladder is normal. Adrenal glands are normal. Stomach/Bowel: Stomach is within normal limits. Appendix has been removed. No evidence of bowel wall thickening, distention, or inflammatory changes. Vascular/Lymphatic: Aortic atherosclerosis. No enlarged abdominal or pelvic lymph nodes. Reproductive: Status post hysterectomy. No adnexal masses. Other: Small amount of fluid in the pelvic cul-de-sac. No free air. No abdominal hernia. Musculoskeletal: Old compression fracture of L1. Previous lumbar fusion at L4-5 and L5-S1. Severe degenerative disc disease at L2-3 and L3-4. No acute bone abnormality. IMPRESSION: 1. Slightly complex cyst on the lower pole of the right kidney with adjacent perinephric soft tissue stranding and enhancement of the mucosa of the right renal pelvis and right ureter consistent with pyelonephritis. 2. No other significant abnormality. 3. Aortic atherosclerosis. 4. Old compression fracture of L1. Previous lumbar fusion at L4-5 and L5-S1. 5. Small amount of nonspecific fluid in the pelvic cul-de-sac. Aortic Atherosclerosis (ICD10-I70.0). Electronically Signed   By: JLorriane ShireM.D.   On: 06/03/2019 21:50   DG Chest Port 1 View  Result Date: 06/03/2019 CLINICAL DATA:  Fever. EXAM:  PORTABLE CHEST 1 VIEW COMPARISON:  None. FINDINGS: Very mildly increased lung markings are seen with a trace amount of atelectasis noted within the left lung base. There is no evidence of a pleural effusion or pneumothorax. The heart size and mediastinal contours are within normal limits. There is marked severity calcification of the aortic arch. The visualized skeletal structures are unremarkable. IMPRESSION: No active disease. Electronically Signed   By: Virgina Norfolk M.D.   On: 06/03/2019 20:08     CT imaging reviewed, incidental findings.  Most notable though is possible right-sided pyelonephritis with associated complex cyst  ____________________________________________   PROCEDURES  Procedure(s) performed: None  Procedures  Critical Care performed: No  ____________________________________________   INITIAL IMPRESSION / ASSESSMENT AND PLAN / ED COURSE  Pertinent labs & imaging results that were available during my care of the patient were reviewed by me and considered in my medical decision making (see chart for details).   Patient presents with symptoms that seem viral-like in etiology, but certainly we will work her up here in the ER for concerns of possible bacterial infection including pulmonary, GI, urinary, and other etiologies.  Await rapid Covid test as this certainly does appear on my differential given her presentation.  If her Covid test is negative anticipate CT abdomen pelvis to evaluate for intra-abdominal etiology.  In the interim we will start to hydrate her, replete potassium as she does demonstrate mild QT prolongation and hypokalemia  ----------------------------------------- 10:29 PM on 06/03/2019 -----------------------------------------  Patient understanding comfortable plan for admission.  Admission discussed with Dr. Posey Pronto.      ____________________________________________   FINAL CLINICAL IMPRESSION(S) / ED DIAGNOSES  Final diagnoses:  EKG  abnormality (Q-T prolongation present at birth)  Hypokalemia  Fever, unspecified fever cause  Pyelonephritis  Sepsis, due to unspecified organism, unspecified whether acute organ dysfunction present [facility])  Renal cyst, right      Note:  This document was prepared using Dragon voice recognition software and may include unintentional dictation errors       Delman Kitten, MD 06/03/19 2229

## 2019-06-03 NOTE — Telephone Encounter (Signed)
I spoke with Zacarias Pontes (DPR signed) pts symptoms seemed to start 2 days ago with temp fluctuations going from 95. 3 to 103.8, shivering and pt in extreme pain in entire body with no appetite. Pt is continuously moaning. Zacarias Pontes is on  Her way to [facility] ED with pt; pt is concerned pt is dehydrated after not eating or drinking in 1 1/2 days. Zacarias Pontes will cb with update after pt's eval at ED. FYI to Glenda Chroman FNP.

## 2019-06-03 NOTE — ED Triage Notes (Signed)
Reports fever of 103F since Sunday, gets better with Tylenol. Body aches, nausea, diarrhea. Took tylenol at 200PM today.

## 2019-06-03 NOTE — H&P (Signed)
History and Physical    Margarita Croke IRS:[id number] DOB: [birth year] DOA: 06/03/2019  PCP: Elby Beck, FNP  Patient coming from: Home  I have personally briefly reviewed patient's old medical records in [place]  Chief Complaint: Fevers, diarrhea  HPI: Galadriel Shroff is a 79 y.o. female with medical history significant for asthma, GERD, small fiber neuropathy, restless leg syndrome, and OSA on CPAP who presents to the ED for evaluation of fevers and diarrhea.    Patient states she was in her usual state of health until 2 days ago when she began to develop intermittent fevers with rigors.  She has been taking Tylenol with transient improvement.  Yesterday she developed frequent watery nonbloody diarrhea.  She has had associated nausea without emesis as well as abdominal cramping.  She has had significant fatigue, generalized weakness, muscle aches, and lightheadedness without syncope or fall.  She denies any dysuria, chest pain, dyspnea, cough.  She reports occasional swelling in her legs at night which improves with elevation but no swelling currently.  She denies any prior positive COVID-19 testing.  She does state that she has received her 2 doses of COVID-19 vaccination.  ED Course:  Initial vitals showed BP 103/56, pulse 92, RR 20, temp 100.7 Fahrenheit, SPO2 95% on room air.  Labs are notable for potassium 2.5, magnesium 1.8, sodium 130, chloride 92, bicarb 25, BUN 27, creatinine 1.03, serum glucose 131, LFTs within normal limits, WBC 10.7, hemoglobin 12.1, platelets 226,000, lipase 17, procalcitonin 83.89, lactic acid 0.9.  POC SARS-CoV-2 test is negative.  Urinalysis shows negative nitrates, small leukocytes, 0-5 RBC/hpf, 21-50 WBC/hpf, many bacteria on microscopy.  Blood cultures were obtained and pending.  C. difficile and GI pathogen panels were obtained and negative.  Portable chest x-ray was negative for focal consolidation, effusion, or pneumothorax.  CT  abdomen/pelvis with contrast showed slightly complex cyst on the lower pole of the right kidney which changes consistent with pyelonephritis.  No other significant abnormality noted.  Old compression fracture of L1 and prior lumbar fusion at L4-5 and L5-S1 noted.  Patient was given 500 cc normal saline, IV K 10 mEq x 1 and oral K 40 mEq x 1, IV ceftriaxone 1 g, and Zofran.  The hospitalist service was consulted to admit for further evaluation and management.  Review of Systems: All systems reviewed and are negative except as documented in history of present illness above.   Past Medical History:  Diagnosis Date  . Arthritis   . Asthma    exercise induced asthma  . Colon polyps   . Dyspnea   . GERD (gastroesophageal reflux disease)   . Hyperlipidemia   . Hypertension   . Neuropathy    right leg and foot  . Plantar fasciitis   . Prediabetes   . RLS (restless legs syndrome)   . Sleep apnea    CPAP  . SOB (shortness of breath)   . Urinary incontinence   . Urinary tract infection   . Wears hearing aid in right ear     Past Surgical History:  Procedure Laterality Date  . ABDOMINAL HYSTERECTOMY  1972  . APPENDECTOMY  1958  . BACK SURGERY     lumbar fusion  . BILATERAL SALPINGOOPHORECTOMY  01/07/2018  . BIOPSY  04/15/2019   Procedure: BIOPSY;  Surgeon: [name], MD;  Location: [place];  Service: Endoscopy;;  . CARDIAC CATHETERIZATION     over 10 yrs ago.  [place], [employer]  .  COLONOSCOPY WITH PROPOFOL N/A 11/11/2018   Procedure: COLONOSCOPY WITH PROPOFOL;  Surgeon: [name], MD;  Location: [facility] ENDOSCOPY;  Service: Gastroenterology;  Laterality: N/A;  . CYSTO WITH HYDRODISTENSION N/A 12/30/2018   Procedure: CYSTOSCOPY/HYDRODISTENSION WITH MARCAINE;  Surgeon: Bjorn Loser, MD;  Location: [facility] ORS;  Service: Urology;  Laterality: N/A;  . ESOPHAGOGASTRODUODENOSCOPY (EGD) WITH PROPOFOL N/A 04/15/2019   Procedure:  ESOPHAGOGASTRODUODENOSCOPY (EGD) WITH PROPOFOL;  Surgeon: [name], MD;  Location: [place];  Service: Endoscopy;  Laterality: N/A;  sleep apnea  . HERNIA REPAIR     abd  . HERNIA REPAIR     umbilical  . INGUINAL HERNIA REPAIR Bilateral    inguinal  . LAPAROSCOPIC BILATERAL SALPINGO OOPHERECTOMY    . SPINAL FUSION  2002  . TONSILLECTOMY AND ADENOIDECTOMY  1961    Social History:  reports that she has never smoked. She has never used smokeless tobacco. She reports previous alcohol use. She reports that she does not use drugs.  Allergies  Allergen Reactions  . Other Anaphylaxis    Bee Sting   . Ciprofloxacin Hcl Rash    Mild rash after long term use  . Sulfa Antibiotics Rash    Bactrim Bactrim, Cipro     Family History  Problem Relation Age of Onset  . Alcohol abuse Mother   . Cancer Mother   . Stroke Mother   . Alcohol abuse Father   . Arthritis Sister   . Arthritis Daughter   . COPD Daughter   . Arthritis Paternal Grandmother   . Asthma Paternal Grandmother   . COPD Paternal Grandmother   . Breast cancer Paternal Aunt      Prior to Admission medications   Medication Sig Start Date End Date Taking? Authorizing Provider  alendronate (FOSAMAX) 70 MG tablet Take 70 mg by mouth once a week. Take with a full glass of water on an empty stomach.   Yes [provider]  acetaminophen (TYLENOL) 650 MG CR tablet Take 1,300 mg by mouth at bedtime.    [provider]  albuterol (VENTOLIN HFA) 108 (90 Base) MCG/ACT inhaler Inhale 2 puffs into the lungs every 6 (six) hours as needed for wheezing or shortness of breath. 05/18/19   Elby Beck, FNP  amLODipine (NORVASC) 5 MG tablet Take 1 tablet (5 mg total) by mouth daily. 04/22/19   Elby Beck, FNP  aspirin 81 MG EC tablet Take 81 mg by mouth at bedtime.     [provider]  Cholecalciferol (VITAMIN D3) 50 MCG (2000 UT) TABS Take by mouth at bedtime.    [provider]  Cyanocobalamin (VITAMIN B-12) 3000 MCG SUBL Take 3,000 mcg by mouth daily.    [provider]  DIAZEPAM RE Place 10 mg rectally at bedtime.    [provider]  diclofenac sodium (VOLTAREN) 1 % GEL Apply 1 application topically 2 (two) times daily as needed (pain.).     [provider]  dicyclomine (BENTYL) 10 MG capsule TAKE 1 CAPSULE BY MOUTH AT  BEDTIME 04/29/19   Vanga, Tally Due, MD  EPINEPHrine 0.3 mg/0.3 mL IJ SOAJ injection Inject 0.3 mLs (0.3 mg total) into the muscle as needed for anaphylaxis. 04/22/19   Elby Beck, FNP  estradiol (ESTRACE) 0.1 MG/GM vaginal cream Place 1 Applicatorful vaginally 3 (three) times a week. Patient taking differently: Place 1 Applicatorful vaginally at bedtime.  10/09/18 10/09/19  Elby Beck, FNP  fluticasone (FLONASE) 50 MCG/ACT nasal spray  Place 2 sprays into both nostrils daily. 04/22/19   Elby Beck, FNP  fluticasone (FLOVENT HFA) 44 MCG/ACT inhaler Inhale into the lungs. 01/31/19 01/31/20  [provider]  hydrochlorothiazide (HYDRODIURIL) 25 MG tablet TAKE 1 TABLET BY MOUTH  DAILY 04/21/19   Elby Beck, FNP  hydrOXYzine (ATARAX/VISTARIL) 25 MG tablet 1po  one hour before sleep 04/03/19   [provider]  lidocaine (XYLOCAINE) 2 % jelly Apply 1 application topically as needed (pain.).    [provider]  lidocaine (XYLOCAINE) 5 % ointment  04/03/19   [provider]  linaclotide (LINZESS) 72 MCG capsule Take 1 capsule (72 mcg total) by mouth daily before breakfast. 04/25/19   Vanga, Tally Due, MD  omeprazole (PRILOSEC) 40 MG capsule Take 1 capsule (40 mg total) by mouth 2 (two) times daily before a meal. 04/15/19 05/15/19  Vanga, Tally Due, MD  pentosan polysulfate (ELMIRON) 100 MG capsule Take by mouth. 04/03/19   [provider]  polyethylene glycol (MIRALAX / GLYCOLAX) 17 g packet Take 1 packet by mouth daily.     [provider]    pregabalin (LYRICA) 150 MG capsule Take 1 capsule (150 mg total) by mouth 2 (two) times daily. 04/29/19   Elby Beck, FNP  rOPINIRole (REQUIP) 0.5 MG tablet Take 1-3 tablets (0.5-1.5 mg total) by mouth See admin instructions. Take 1 tablet (0.5 mg) by mouth in the morning, 1 tablet (0.5 mg) by mouth in the afternoon, & take 3 tablets (1.5 mg) by mouth at bedtime. 04/22/19   Elby Beck, FNP  simvastatin (ZOCOR) 20 MG tablet Take 0.5 tablets (10 mg total) by mouth at bedtime. 04/22/19   Elby Beck, FNP    Physical Exam: Vitals:   06/03/19 2251 06/03/19 2252 06/03/19 2253 06/03/19 2254  BP:      Pulse: 78 80 79 80  Resp:      Temp:      TempSrc:      SpO2: 97% 99% 100% 97%  Weight:      Height:       Constitutional: Resting supine in bed, NAD, calm, appears tired but comfortable with intermittent rigors Eyes: PERRL, lids and conjunctivae normal ENMT: Mucous membranes are dry. Posterior pharynx clear of any exudate or lesions.Normal dentition.  Neck: normal, supple, no masses. Respiratory: clear to auscultation bilaterally, no wheezing, no crackles. Normal respiratory effort. No accessory muscle use.  Cardiovascular: Regular rate and rhythm, no murmurs / rubs / gallops. No extremity edema. 2+ pedal pulses. Abdomen: Mild lower abdominal and suprapubic tenderness, no masses palpated. No hepatosplenomegaly. Bowel sounds hyperactive.  Musculoskeletal: no clubbing / cyanosis. No joint deformity upper and lower extremities. Good ROM, no contractures. Normal muscle tone.  Skin: no rashes, lesions, ulcers. No induration Neurologic: CN 2-12 grossly intact. Sensation intact, Strength 5/5 in all 4.  Psychiatric: Normal judgment and insight. Alert and oriented x 3. Normal mood.   Labs on Admission: I have personally reviewed following labs and imaging studies  CBC: Recent Labs  Lab 06/03/19 1724  WBC 10.7*  HGB 12.1  HCT 36.1  MCV 81.9  PLT 297   Basic Metabolic  Panel: Recent Labs  Lab 06/03/19 1724  NA 130*  K 2.5*  CL 92*  CO2 25  GLUCOSE 131*  BUN 27*  CREATININE 1.03*  CALCIUM 7.8*  MG 1.8   GFR: Estimated Creatinine Clearance: 39.7 mL/min (A) (by C-G formula based on SCr of 1.03 mg/dL (H)). Liver Function Tests:  Recent Labs  Lab 06/03/19 1724  AST 28  ALT 32  ALKPHOS 75  BILITOT 0.8  PROT 6.7  ALBUMIN 3.3*   Recent Labs  Lab 06/03/19 1724  LIPASE 17   No results for input(s): AMMONIA in the last 168 hours. Coagulation Profile: No results for input(s): INR, PROTIME in the last 168 hours. Cardiac Enzymes: No results for input(s): CKTOTAL, CKMB, CKMBINDEX, TROPONINI in the last 168 hours. BNP (last 3 results) No results for input(s): PROBNP in the last 8760 hours. HbA1C: No results for input(s): HGBA1C in the last 72 hours. CBG: No results for input(s): GLUCAP in the last 168 hours. Lipid Profile: No results for input(s): CHOL, HDL, LDLCALC, TRIG, CHOLHDL, LDLDIRECT in the last 72 hours. Thyroid Function Tests: No results for input(s): TSH, T4TOTAL, FREET4, T3FREE, THYROIDAB in the last 72 hours. Anemia Panel: No results for input(s): VITAMINB12, FOLATE, FERRITIN, TIBC, IRON, RETICCTPCT in the last 72 hours. Urine analysis:    Component Value Date/Time   COLORURINE YELLOW (A) 06/03/2019 1948   APPEARANCEUR HAZY (A) 06/03/2019 1948   APPEARANCEUR Hazy (A) 02/10/2019 1335   LABSPEC 1.013 06/03/2019 1948   PHURINE 5.0 06/03/2019 1948   GLUCOSEU NEGATIVE 06/03/2019 1948   HGBUR SMALL (A) 06/03/2019 [place] NEGATIVE 06/03/2019 1948   BILIRUBINUR Negative 02/10/2019 [place] 06/03/2019 1948   PROTEINUR 30 (A) 06/03/2019 1948   NITRITE NEGATIVE 06/03/2019 1948   LEUKOCYTESUR SMALL (A) 06/03/2019 1948    Radiological Exams on Admission: CT ABDOMEN PELVIS W CONTRAST  Result Date: 06/03/2019 CLINICAL DATA:  Left-sided abdominal pain. Fever. Diarrhea. EXAM: CT ABDOMEN AND PELVIS WITH  CONTRAST TECHNIQUE: Multidetector CT imaging of the abdomen and pelvis was performed using the standard protocol following bolus administration of intravenous contrast. CONTRAST:  194m OMNIPAQUE IOHEXOL 300 MG/ML  SOLN COMPARISON:  None. FINDINGS: Lower chest: Normal. Hepatobiliary: No focal liver abnormality is seen. No gallstones, gallbladder wall thickening, or biliary dilatation. Pancreas: Unremarkable. No pancreatic ductal dilatation or surrounding inflammatory changes. Spleen: Normal in size without focal abnormality. Adrenals/Urinary Tract: There is perinephric soft tissue stranding adjacent to a slightly complex cyst on the lower pole right kidney with slight abnormal perfusion to the adjacent renal parenchyma. This lesion is approximately 3.2 cm in size. There is slight enhancement of the mucosa of the renal pelvis and the right ureter is slightly prominent with enhancement of the mucosa. The left kidney appears normal. Bladder is normal. Adrenal glands are normal. Stomach/Bowel: Stomach is within normal limits. Appendix has been removed. No evidence of bowel wall thickening, distention, or inflammatory changes. Vascular/Lymphatic: Aortic atherosclerosis. No enlarged abdominal or pelvic lymph nodes. Reproductive: Status post hysterectomy. No adnexal masses. Other: Small amount of fluid in the pelvic cul-de-sac. No free air. No abdominal hernia. Musculoskeletal: Old compression fracture of L1. Previous lumbar fusion at L4-5 and L5-S1. Severe degenerative disc disease at L2-3 and L3-4. No acute bone abnormality. IMPRESSION: 1. Slightly complex cyst on the lower pole of the right kidney with adjacent perinephric soft tissue stranding and enhancement of the mucosa of the right renal pelvis and right ureter consistent with pyelonephritis. 2. No other significant abnormality. 3. Aortic atherosclerosis. 4. Old compression fracture of L1. Previous lumbar fusion at L4-5 and L5-S1. 5. Small amount of nonspecific  fluid in the pelvic cul-de-sac. Aortic Atherosclerosis (ICD10-I70.0). Electronically Signed   By: JLorriane ShireM.D.   On: 06/03/2019 21:50   DG Chest Port 1 View  Result Date: 06/03/2019 CLINICAL  DATA:  Fever. EXAM: PORTABLE CHEST 1 VIEW COMPARISON:  None. FINDINGS: Very mildly increased lung markings are seen with a trace amount of atelectasis noted within the left lung base. There is no evidence of a pleural effusion or pneumothorax. The heart size and mediastinal contours are within normal limits. There is marked severity calcification of the aortic arch. The visualized skeletal structures are unremarkable. IMPRESSION: No active disease. Electronically Signed   By: Virgina Norfolk M.D.   On: 06/03/2019 20:08    EKG: Independently reviewed. Sinus rhythm, no acute ischemic changes.  Not significantly changed when compared to prior.  Assessment/Plan Principal Problem:   Gastroenteritis Active Problems:   Benign essential hypertension   OSA on CPAP   High cholesterol   Restless legs syndrome   Small fiber neuropathy   Asthma   Pyelonephritis of right kidney   Hypokalemia   Hyponatremia  Zyriah Mask is a 79 y.o. female with medical history significant for asthma, GERD, small fiber neuropathy, restless leg syndrome, and OSA on CPAP who is admitted with gastroenteritis and right-sided pyelonephritis.  Gastroenteritis with dehydration: Presumed viral etiology as C. difficile and GI pathogen panels are negative.  She is having continued watery diarrhea.  We will continue supportive care. -Continue IV fluid resuscitation overnight -Imodium and antiemetics as needed -Obtain SARS-CoV-2 PCR -Advance diet as tolerated  Complicated UTI with right-sided pyelonephritis: Patient with suprapubic pain, UA suggestive of UTI, and CT findings suggestive of a right-sided pyelonephritis.  Last urine culture from 02/10/2019 grew pansensitive E. Coli. -Continue empiric IV ceftriaxone -Add on urine  culture, follow blood cultures  Hypokalemia: K 2.5 in setting of GI losses and HCTZ.  Initial supplementation provided in the ED.  We will also replete magnesium and give additional potassium with fluids and orally.  Recheck labs in the morning.  Hyponatremia: Mild, likely hypovolemic from GI losses.  Continue IV fluid hydration as above and repeat labs in the morning.  Hypertension: Hold home antihypertensives in setting of hypotension.  Hyperlipidemia: Continue simvastatin.  Asthma: Chronic and stable.  Continue Flovent.  Neuropathy: Holding home Lyrica for now.  Restless leg syndrome: Holding home Requip for now.  OSA: Continue CPAP nightly.  DVT prophylaxis: Lovenox Code Status: Full code, confirmed with patient Family Communication: Discussed with patient, she has discussed with family Disposition Plan: From home, likely discharge to home pending improvement in electrolyte abnormalities, diarrhea, and ability to maintain adequate oral intake and transition to oral antibiotics. Consults called: None Admission status: Observation   Zada Finders MD [facility]  If 7PM-7AM, please contact night-coverage [email or web address]  06/03/2019, 11:05 PM

## 2019-06-03 NOTE — Telephone Encounter (Signed)
The following message came through [place], please triage:  This is Saylor's daughter Erin Petersen. She is having huge temp fluctuations, shivering until she is in extreme pain, and zero appetite for two days. Yer skin does not bounce back at all when pinched. I am very concerned. Can this be a result of her ulcer? Can it be bleeding?  She is moaning in distress.  You have a release to speak with me in her chart. My direct number is [phone]. Erin Petersen.

## 2019-06-03 NOTE — ED Notes (Signed)
Date and time results received: 06/03/19 6:02 PM    Test: potassium Critical Value: 2.5 Name of Provider Notified: Dr. Merlyn Lot

## 2019-06-04 ENCOUNTER — Inpatient Hospital Stay (HOSPITAL_COMMUNITY)
Admit: 2019-06-04 | Discharge: 2019-06-04 | Disposition: A | Discharge status: Home / Self Care | Payer: Medicare Other | Attending: Internal Medicine | Admitting: Internal Medicine

## 2019-06-04 DIAGNOSIS — B9620 Unspecified Escherichia coli [E. coli] as the cause of diseases classified elsewhere: Secondary | ICD-10-CM | POA: Diagnosis present

## 2019-06-04 DIAGNOSIS — E785 Hyperlipidemia, unspecified: Secondary | ICD-10-CM | POA: Diagnosis present

## 2019-06-04 DIAGNOSIS — B9689 Other specified bacterial agents as the cause of diseases classified elsewhere: Secondary | ICD-10-CM | POA: Diagnosis present

## 2019-06-04 DIAGNOSIS — I10 Essential (primary) hypertension: Secondary | ICD-10-CM | POA: Diagnosis not present

## 2019-06-04 DIAGNOSIS — E871 Hypo-osmolality and hyponatremia: Secondary | ICD-10-CM | POA: Diagnosis present

## 2019-06-04 DIAGNOSIS — R7881 Bacteremia: Secondary | ICD-10-CM | POA: Diagnosis not present

## 2019-06-04 DIAGNOSIS — E669 Obesity, unspecified: Secondary | ICD-10-CM | POA: Diagnosis present

## 2019-06-04 DIAGNOSIS — N3010 Interstitial cystitis (chronic) without hematuria: Secondary | ICD-10-CM | POA: Diagnosis present

## 2019-06-04 DIAGNOSIS — K529 Noninfective gastroenteritis and colitis, unspecified: Secondary | ICD-10-CM | POA: Diagnosis not present

## 2019-06-04 DIAGNOSIS — G4730 Sleep apnea, unspecified: Secondary | ICD-10-CM | POA: Diagnosis present

## 2019-06-04 DIAGNOSIS — Z6834 Body mass index (BMI) 34.0-34.9, adult: Secondary | ICD-10-CM | POA: Diagnosis not present

## 2019-06-04 DIAGNOSIS — G629 Polyneuropathy, unspecified: Secondary | ICD-10-CM | POA: Diagnosis present

## 2019-06-04 DIAGNOSIS — G4733 Obstructive sleep apnea (adult) (pediatric): Secondary | ICD-10-CM | POA: Diagnosis present

## 2019-06-04 DIAGNOSIS — Z20822 Contact with and (suspected) exposure to covid-19: Secondary | ICD-10-CM | POA: Diagnosis present

## 2019-06-04 DIAGNOSIS — E861 Hypovolemia: Secondary | ICD-10-CM | POA: Diagnosis present

## 2019-06-04 DIAGNOSIS — N12 Tubulo-interstitial nephritis, not specified as acute or chronic: Principal | ICD-10-CM

## 2019-06-04 DIAGNOSIS — E860 Dehydration: Secondary | ICD-10-CM | POA: Diagnosis present

## 2019-06-04 DIAGNOSIS — I959 Hypotension, unspecified: Secondary | ICD-10-CM | POA: Diagnosis present

## 2019-06-04 DIAGNOSIS — G2581 Restless legs syndrome: Secondary | ICD-10-CM | POA: Diagnosis present

## 2019-06-04 DIAGNOSIS — K219 Gastro-esophageal reflux disease without esophagitis: Secondary | ICD-10-CM | POA: Diagnosis present

## 2019-06-04 DIAGNOSIS — E7800 Pure hypercholesterolemia, unspecified: Secondary | ICD-10-CM | POA: Diagnosis present

## 2019-06-04 DIAGNOSIS — E876 Hypokalemia: Secondary | ICD-10-CM | POA: Diagnosis not present

## 2019-06-04 HISTORY — DX: Bacteremia: R78.81

## 2019-06-04 LAB — BLOOD CULTURE ID PANEL (REFLEXED)
Acinetobacter baumannii: NOT DETECTED
Candida albicans: NOT DETECTED
Candida glabrata: NOT DETECTED
Candida krusei: NOT DETECTED
Candida parapsilosis: NOT DETECTED
Candida tropicalis: NOT DETECTED
Carbapenem resistance: NOT DETECTED
Enterobacter cloacae complex: NOT DETECTED
Enterobacteriaceae species: DETECTED — AB
Enterococcus species: NOT DETECTED
Escherichia coli: DETECTED — AB
Haemophilus influenzae: NOT DETECTED
Klebsiella oxytoca: NOT DETECTED
Klebsiella pneumoniae: NOT DETECTED
Listeria monocytogenes: NOT DETECTED
Methicillin resistance: NOT DETECTED
Neisseria meningitidis: NOT DETECTED
Proteus species: NOT DETECTED
Pseudomonas aeruginosa: NOT DETECTED
Serratia marcescens: NOT DETECTED
Staphylococcus aureus (BCID): NOT DETECTED
Staphylococcus species: NOT DETECTED
Streptococcus agalactiae: NOT DETECTED
Streptococcus pneumoniae: NOT DETECTED
Streptococcus pyogenes: NOT DETECTED
Streptococcus species: NOT DETECTED
Vancomycin resistance: NOT DETECTED

## 2019-06-04 LAB — BASIC METABOLIC PANEL
Anion gap: 11 (ref 5–15)
BUN: 17 mg/dL (ref 8–23)
CO2: 25 mmol/L (ref 22–32)
Calcium: 7.7 mg/dL — ABNORMAL LOW (ref 8.9–10.3)
Chloride: 98 mmol/L (ref 98–111)
Creatinine, Ser: 0.82 mg/dL (ref 0.44–1.00)
GFR calc Af Amer: >60 mL/min (ref >60)
GFR calc non Af Amer: >60 mL/min (ref >60)
Glucose, Bld: 103 mg/dL — ABNORMAL HIGH (ref 70–99)
Potassium: 3.9 mmol/L (ref 3.5–5.1)
Sodium: 134 mmol/L — ABNORMAL LOW (ref 135–145)

## 2019-06-04 LAB — MAGNESIUM: Magnesium: 2.6 mg/dL — ABNORMAL HIGH (ref 1.7–2.4)

## 2019-06-04 LAB — CBC
HCT: 35.2 % — ABNORMAL LOW (ref 36.0–46.0)
Hemoglobin: 11.7 g/dL — ABNORMAL LOW (ref 12.0–15.0)
MCH: 27.4 pg (ref 26.0–34.0)
MCHC: 33.2 g/dL (ref 30.0–36.0)
MCV: 82.4 fL (ref 80.0–100.0)
Platelets: 233 10*3/uL (ref 150–400)
RBC: 4.27 MIL/uL (ref 3.87–5.11)
RDW: 14.2 % (ref 11.5–15.5)
WBC: 9.8 10*3/uL (ref 4.0–10.5)
nRBC: 0 % (ref 0.0–0.2)

## 2019-06-04 LAB — ECHOCARDIOGRAM COMPLETE
Height: 58 in
Weight: 2620.8 oz

## 2019-06-04 LAB — SARS CORONAVIRUS 2 (TAT 6-24 HRS): SARS Coronavirus 2: NEGATIVE

## 2019-06-04 LAB — PHOSPHORUS: Phosphorus: 1.8 mg/dL — ABNORMAL LOW (ref 2.5–4.6)

## 2019-06-04 MED ORDER — SODIUM PHOSPHATES 45 MMOLE/15ML IV SOLN
20.0000 mmol | Freq: Once | INTRAVENOUS | Status: AC
  Administered 2019-06-04: 20 mmol via INTRAVENOUS
  Filled 2019-06-04: qty 6.67

## 2019-06-04 MED ORDER — ROPINIROLE HCL 1 MG PO TABS
0.5000 mg | ORAL_TABLET | Freq: Two times a day (BID) | ORAL | Status: DC
  Administered 2019-06-04 – 2019-06-06 (×6): 0.5 mg via ORAL
  Filled 2019-06-04 (×7): qty 1

## 2019-06-04 MED ORDER — TRAMADOL HCL 50 MG PO TABS
50.0000 mg | ORAL_TABLET | Freq: Two times a day (BID) | ORAL | Status: DC | PRN
  Administered 2019-06-04 – 2019-06-06 (×3): 50 mg via ORAL
  Filled 2019-06-04 (×3): qty 1

## 2019-06-04 MED ORDER — ONDANSETRON HCL 4 MG/2ML IJ SOLN
4.0000 mg | Freq: Three times a day (TID) | INTRAMUSCULAR | Status: DC | PRN
  Administered 2019-06-05 – 2019-06-07 (×6): 4 mg via INTRAVENOUS
  Filled 2019-06-04 (×6): qty 2

## 2019-06-04 MED ORDER — ROPINIROLE HCL 1 MG PO TABS
1.5000 mg | ORAL_TABLET | Freq: Every day | ORAL | Status: DC
  Administered 2019-06-04 – 2019-06-06 (×3): 1.5 mg via ORAL
  Filled 2019-06-04 (×3): qty 2

## 2019-06-04 MED ORDER — SODIUM CHLORIDE 0.9 % IV SOLN
1.0000 g | Freq: Two times a day (BID) | INTRAVENOUS | Status: DC
  Administered 2019-06-04 (×2): 1 g via INTRAVENOUS
  Filled 2019-06-04 (×5): qty 1

## 2019-06-04 MED ORDER — ROPINIROLE HCL 1 MG PO TABS: 0.5000 mg | ORAL_TABLET | ORAL | Status: DC

## 2019-06-04 NOTE — Progress Notes (Signed)
Patient refused cpap. States she wears one at home however since she is sick at the moment she cannot tolerate therapy at this time.

## 2019-06-04 NOTE — Consult Note (Signed)
PHARMACY CONSULT NOTE - FOLLOW UP  Pharmacy Consult for Electrolyte Monitoring and Replacement   Recent Labs: Potassium (mmol/L)  Date Value  06/04/2019 3.9   Magnesium (mg/dL)  Date Value  06/04/2019 2.6 (H)   Calcium (mg/dL)  Date Value  06/04/2019 7.7 (L)   Albumin (g/dL)  Date Value  06/03/2019 3.3 (L)   Phosphorus (mg/dL)  Date Value  06/04/2019 1.8 (L)   Sodium (mmol/L)  Date Value  06/04/2019 134 (L)   Corrected Ca 8.3  Assessment: 79 y.o. female with medical history significant for asthma, GERD, small fiber neuropathy, restless leg syndrome, and OSA on CPAP who presents to the ED for evaluation of fevers and diarrhea.    Goal of Therapy:  WNL  Plan:  Pharmacy ordered sodium phosphate 20 mmol. Will follow up with BMP and phos level with AM labs.   Oswald Hillock ,PharmD, BCPS Clinical Pharmacist 06/04/2019 8:17 AM

## 2019-06-04 NOTE — Progress Notes (Signed)
PROGRESS NOTE    Erin Petersen   BHA:[id number]  DOB: 1940-11-25  PCP: Elby Beck, FNP    DOA: 06/03/2019 LOS: 0    Brief Narrative  Erin Petersen is a 79 y.o. female with medical history significant for  interstitial cystitis, asthma, GERD, small fiber neuropathy, restless leg syndrome, and OSA on CPAP who presented to the ED on 4/6 for evaluation of fevers and diarrhea x 2 days.  Associated symptoms of intermittent fevers with rigors, nausea without emesis, abdominal cramping, fatigue, generalized weakness, body aches and lightheadedness.  In the ED, borderline BP 103/56, HR 92, RR 20, temp 100.7 (later up to 103).  Labs with marked electrolytes abnormalities, WBC 10.7k, normal lipase, normal lactic acid.  Covid-19 negative.  UA consistent with infection, sent for cultures.  C diff and GI pathogen panel negative.  CXR negative.  CT abdomen/pelvis showed R pyelonephritis.  Patient's initial blood culture returned positive for GNR's.  Antibiotics changed to meropenem pending identification and susceptibilities.  Treated with IV fluids, potassium and IV antibiotics.  Admitted to hospitalist service.    Assessment & Plan   Principal Problem:   Gastroenteritis Active Problems:   Pyelonephritis of right kidney   Hypokalemia   Hyponatremia   Bacteremia due to Gram-negative bacteria   Benign essential hypertension   OSA on CPAP   High cholesterol   Restless legs syndrome   Small fiber neuropathy   Asthma   Gastroenteritis -patient presented with persistent watery, nonbloody diarrhea.  C. difficile and GI panel negative.  COVID-19 negative as well.  Suspect this is a viral gastroenteritis versus secondary to bacteremia pyelonephritis.  Improved after Imodium.  Continue supportive care with IV fluids, antiemetics as needed, Imodium as needed, advance diet as tolerated. --Diet advanced to soft at lunch today  Bacteremia due to Gram-negative bacteria Complicated UTI with  Pyelonephritis of right kidney Patient presented with suprapubic pain, UA consistent with UTI, CT abdomen pelvis with changes consistent with right-sided pyelonephritis.  Patient was initially treated with Rocephin based on prior cultures showing pansensitive E. coli.  Initial blood culture subsequently grew gram-negative rods.  Antibiotics switched to meropenem pending identification and susceptibility. --Follow-up urine and blood cultures --Repeat blood culture tomorrow --Continue meropenem for now --Echo    Hypokalemia -replaced.  Potassium 2.5 setting of diarrhea. --Monitor daily and replete as needed  Hyponatremia -likely hypovolemic in the setting of diarrhea --IV hydration as above  Benign essential hypertension -chronic, stable, currently borderline low secondary to hypovolemia.  Home regimen is Norvasc 5 mg, HCTZ 25 mg. --Hold HCTZ given hyponatremia --Hold Norvasc as well due to borderline blood pressures  OSA on CPAP -CPAP ordered  Hyperlipidemia - continue home statin  Restless legs syndrome - Requip on hold due to BP's borderline    Small fiber neuropathy - Lyrica held due to BP's borderline  Asthma -not acutely exacerbated.  Continue home Flovent  Obesity: Body mass index is 34.23 kg/m.    DVT prophylaxis: Lovenox  Diet Orders (From admission, onward)    Start     Ordered   06/04/19 1143  DIET SOFT Room service appropriate? Yes; Fluid consistency: Thin  Diet effective now    Question Answer Comment  Room service appropriate? Yes   Fluid consistency: Thin      06/04/19 1142            Code Status: Full Code    Subjective 06/04/19   Patient seen at bedside this morning.  No acute events reported  overnight.  She reports tolerating clear liquid diet and wants to try something like a blueberry muffin today.  She continues to report intermittent hot sweats and chills.  States Imodium helped her diarrhea.  Denies nausea or vomiting at this  time.   Disposition Plan & Communication   Dispo & Barriers: Gram-negative bacteremia with identification and susceptibilities pending Coming from: Home Exp d/c date: 4/9 Medically stable for d/c?  No  Severity of Illness: The appropriate patient status for this patient is INPATIENT. It is not anticipated that the patient will be medically stable for discharge from the hospital within 2 midnights of admission.  The following factors support the patient status of inpatient: --Presenting symptoms include persistent watery diarrhea, weakness, lightheadedness, fever/chills. Abdominal cramping, fatigue, body aches.  --Initial radiographic and laboratory data are worrisome because of hypokalemia, hyponatremia. Gram negative bacteremia, CT showing pyelonephritis, UA showing infection. --Chronic co-morbidities include Hypertension, hyperlipidemia, obstructive sleep apnea, small fiber neuropathy, asthma restless leg syndrome, interstitial cystitis. Further evaluation and management in hospital, as outlined above, is warranted because patient's initial blood cultures returned positive for gram-negative rods, with identification and susceptibilities pending.  Need to continue broad IV antibiotic coverage at this time.   Family Communication: None at bedside during encounter, will attempt to call   Consults, Procedures, Significant Events   Consultants:   None  Procedures:   None  Antimicrobials:   Rocephin: 4/6 >> 4/7  Meropenem: 4/7 >>    Objective   Vitals:   06/04/19 0808 06/04/19 0855 06/04/19 1018 06/04/19 1213  BP:    (!) 111/55  Pulse:    72  Resp:    17  Temp:  (!) 101.3 F (38.5 C) 99.9 F (37.7 C) 98.8 F (37.1 C)  TempSrc:  Oral Oral Oral  SpO2: 95%   95%  Weight:      Height:        Intake/Output Summary (Last 24 hours) at 06/04/2019 1356 Last data filed at 06/04/2019 1331 Gross per 24 hour  Intake 1041.54 ml  Output 1325 ml  Net -283.46 ml   Filed Weights    06/03/19 1722 06/04/19 0005  Weight: 72.6 kg 74.3 kg    Physical Exam:  General exam: awake, alert, no acute distress, overweight HEENT: atraumatic, clear conjunctiva, anicteric sclera, moist mucus membranes, hearing grossly normal  Respiratory system: CTAB, no wheezes, rales or rhonchi, normal respiratory effort. Cardiovascular system: normal S1/S2, RRR, no pedal edema.   Gastrointestinal system: soft, mild epigastric tenderness on palpation, no distention, hyperactive bowel sounds. Central nervous system: A&O x4. no gross focal neurologic deficits, normal speech Extremities: moves all, no edema, normal tone Psychiatry: normal mood, congruent affect, judgement and insight appear normal  Labs   Data Reviewed: I have personally reviewed following labs and imaging studies  CBC: Recent Labs  Lab 06/03/19 1724 06/04/19 0449  WBC 10.7* 9.8  HGB 12.1 11.7*  HCT 36.1 35.2*  MCV 81.9 82.4  PLT 226 287   Basic Metabolic Panel: Recent Labs  Lab 06/03/19 1724 06/04/19 0449  NA 130* 134*  K 2.5* 3.9  CL 92* 98  CO2 25 25  GLUCOSE 131* 103*  BUN 27* 17  CREATININE 1.03* 0.82  CALCIUM 7.8* 7.7*  MG 1.8 2.6*  PHOS  --  1.8*   GFR: Estimated Creatinine Clearance: 49.3 mL/min (by C-G formula based on SCr of 0.82 mg/dL). Liver Function Tests: Recent Labs  Lab 06/03/19 1724  AST 28  ALT 32  ALKPHOS 75  BILITOT 0.8  PROT 6.7  ALBUMIN 3.3*   Recent Labs  Lab 06/03/19 1724  LIPASE 17   No results for input(s): AMMONIA in the last 168 hours. Coagulation Profile: No results for input(s): INR, PROTIME in the last 168 hours. Cardiac Enzymes: No results for input(s): CKTOTAL, CKMB, CKMBINDEX, TROPONINI in the last 168 hours. BNP (last 3 results) No results for input(s): PROBNP in the last 8760 hours. HbA1C: No results for input(s): HGBA1C in the last 72 hours. CBG: No results for input(s): GLUCAP in the last 168 hours. Lipid Profile: No results for input(s): CHOL,  HDL, LDLCALC, TRIG, CHOLHDL, LDLDIRECT in the last 72 hours. Thyroid Function Tests: No results for input(s): TSH, T4TOTAL, FREET4, T3FREE, THYROIDAB in the last 72 hours. Anemia Panel: No results for input(s): VITAMINB12, FOLATE, FERRITIN, TIBC, IRON, RETICCTPCT in the last 72 hours. Sepsis Labs: Recent Labs  Lab 06/03/19 1724 06/03/19 1926  PROCALCITON 83.89  --   LATICACIDVEN  --  0.9    Recent Results (from the past 240 hour(s))  Blood Culture (routine x 2)     Status: None (Preliminary result)   Collection Time: 06/03/19  7:26 PM   Specimen: BLOOD  Result Value Ref Range Status   Specimen Description BLOOD LEFT ANTECUBITAL  Final   Special Requests   Final    BOTTLES DRAWN AEROBIC AND ANAEROBIC Blood Culture adequate volume   Culture  Setup Time   Final    Organism ID to follow GRAM NEGATIVE RODS AEROBIC BOTTLE ONLY CRITICAL RESULT CALLED TO, READ BACK BY AND VERIFIED WITH: CHRISTINE KATSOUDAS AT 8101 06/04/19 [place] Performed at [facility], [address] [place], [place] [address]    Culture GRAM NEGATIVE RODS  Final   Report Status PENDING  Incomplete  Blood Culture (routine x 2)     Status: None (Preliminary result)   Collection Time: 06/03/19  7:26 PM   Specimen: BLOOD  Result Value Ref Range Status   Specimen Description BLOOD RIGHT ANTECUBITAL  Final   Special Requests   Final    BOTTLES DRAWN AEROBIC AND ANAEROBIC Blood Culture adequate volume   Culture   Final    NO GROWTH < 12 HOURS Performed at [facility], [address] [place], [place] [address]    Report Status PENDING  Incomplete  Blood Culture ID Panel (Reflexed)     Status: Abnormal   Collection Time: 06/03/19  7:26 PM  Result Value Ref Range Status   Enterococcus species NOT DETECTED NOT DETECTED Final   Vancomycin resistance NOT DETECTED NOT DETECTED Final   Listeria monocytogenes NOT DETECTED NOT DETECTED Final   Staphylococcus species NOT DETECTED NOT DETECTED Final    Staphylococcus aureus (BCID) NOT DETECTED NOT DETECTED Final   Methicillin resistance NOT DETECTED NOT DETECTED Final   Streptococcus species NOT DETECTED NOT DETECTED Final   Streptococcus agalactiae NOT DETECTED NOT DETECTED Final   Streptococcus pneumoniae NOT DETECTED NOT DETECTED Final   Streptococcus pyogenes NOT DETECTED NOT DETECTED Final   Acinetobacter baumannii NOT DETECTED NOT DETECTED Final   Enterobacteriaceae species DETECTED (A) NOT DETECTED Final    Comment: CRITICAL RESULT CALLED TO, READ BACK BY AND VERIFIED WITH:  CHRISTINE KATSOUDAS AT 7824 06/04/19 [facility] Enterobacteriaceae represent a large family of gram-negative bacteria, not a single organism.    Enterobacter cloacae complex NOT DETECTED NOT DETECTED Final   Escherichia coli DETECTED (A) NOT DETECTED Final    Comment: CRITICAL RESULT CALLED TO, READ BACK BY AND  VERIFIED WITH:  [place] AT 6720 06/04/19 [facility]    Klebsiella oxytoca NOT DETECTED NOT DETECTED Final   Klebsiella pneumoniae NOT DETECTED NOT DETECTED Final   Proteus species NOT DETECTED NOT DETECTED Final   Serratia marcescens NOT DETECTED NOT DETECTED Final   Carbapenem resistance NOT DETECTED NOT DETECTED Final   Haemophilus influenzae NOT DETECTED NOT DETECTED Final   Neisseria meningitidis NOT DETECTED NOT DETECTED Final   Pseudomonas aeruginosa NOT DETECTED NOT DETECTED Final   Candida albicans NOT DETECTED NOT DETECTED Final   Candida glabrata NOT DETECTED NOT DETECTED Final   Candida krusei NOT DETECTED NOT DETECTED Final   Candida parapsilosis NOT DETECTED NOT DETECTED Final   Candida tropicalis NOT DETECTED NOT DETECTED Final    Comment: Performed at [facility], [address] [place],  [address]  Gastrointestinal Panel by PCR , Stool     Status: None   Collection Time: 06/03/19  8:27 PM   Specimen: Stool  Result Value Ref Range Status   Campylobacter species NOT DETECTED NOT DETECTED Final   Plesimonas  shigelloides NOT DETECTED NOT DETECTED Final   Salmonella species NOT DETECTED NOT DETECTED Final   Yersinia enterocolitica NOT DETECTED NOT DETECTED Final   Vibrio species NOT DETECTED NOT DETECTED Final   Vibrio cholerae NOT DETECTED NOT DETECTED Final   Enteroaggregative E coli (EAEC) NOT DETECTED NOT DETECTED Final   Enteropathogenic E coli (EPEC) NOT DETECTED NOT DETECTED Final   Enterotoxigenic E coli (ETEC) NOT DETECTED NOT DETECTED Final   Shiga like toxin producing E coli (STEC) NOT DETECTED NOT DETECTED Final   Shigella/Enteroinvasive E coli (EIEC) NOT DETECTED NOT DETECTED Final   Cryptosporidium NOT DETECTED NOT DETECTED Final   Cyclospora cayetanensis NOT DETECTED NOT DETECTED Final   Entamoeba histolytica NOT DETECTED NOT DETECTED Final   Giardia lamblia NOT DETECTED NOT DETECTED Final   Adenovirus F40/41 NOT DETECTED NOT DETECTED Final   Astrovirus NOT DETECTED NOT DETECTED Final   Norovirus GI/GII NOT DETECTED NOT DETECTED Final   Rotavirus A NOT DETECTED NOT DETECTED Final   Sapovirus (I, II, IV, and V) NOT DETECTED NOT DETECTED Final    Comment: Performed at [facility], [place], [place] [address]  C Difficile Quick Screen w PCR reflex     Status: None   Collection Time: 06/03/19  8:27 PM   Specimen: Stool  Result Value Ref Range Status   C Diff antigen NEGATIVE NEGATIVE Final   C Diff toxin NEGATIVE NEGATIVE Final   C Diff interpretation No C. difficile detected.  Final    Comment: Performed at [facility], [place], [place] [address]  SARS CORONAVIRUS 2 (TAT 6-24 HRS) Nasopharyngeal Nasopharyngeal Swab     Status: None   Collection Time: 06/03/19 10:53 PM   Specimen: Nasopharyngeal Swab  Result Value Ref Range Status   SARS Coronavirus 2 NEGATIVE NEGATIVE Final    Comment: (NOTE) SARS-CoV-2 target nucleic acids are NOT DETECTED. The SARS-CoV-2 RNA is generally detectable in upper and lower respiratory  specimens during the acute phase of infection. Negative results do not preclude SARS-CoV-2 infection, do not rule out co-infections with other pathogens, and should not be used as the sole basis for treatment or other patient management decisions. Negative results must be combined with clinical observations, patient history, and epidemiological information. The expected result is Negative. Fact Sheet for Patients: SugarRoll.be Fact Sheet for Healthcare Providers: https://www.woods-mathews.com/ This test is not yet approved  or cleared by the [place] and  has been authorized for detection and/or diagnosis of SARS-CoV-2 by FDA under an Emergency Use Authorization (EUA). This EUA will remain  in effect (meaning this test can be used) for the duration of the COVID-19 declaration under Section 56 4(b)(1) of the Act, 21 U.S.C. section 360bbb-3(b)(1), unless the authorization is terminated or revoked sooner. Performed at [facility], [place] [address], [place], [place] [address]       Imaging Studies   CT ABDOMEN PELVIS W CONTRAST  Result Date: 06/03/2019 CLINICAL DATA:  Left-sided abdominal pain. Fever. Diarrhea. EXAM: CT ABDOMEN AND PELVIS WITH CONTRAST TECHNIQUE: Multidetector CT imaging of the abdomen and pelvis was performed using the standard protocol following bolus administration of intravenous contrast. CONTRAST:  163m OMNIPAQUE IOHEXOL 300 MG/ML  SOLN COMPARISON:  None. FINDINGS: Lower chest: Normal. Hepatobiliary: No focal liver abnormality is seen. No gallstones, gallbladder wall thickening, or biliary dilatation. Pancreas: Unremarkable. No pancreatic ductal dilatation or surrounding inflammatory changes. Spleen: Normal in size without focal abnormality. Adrenals/Urinary Tract: There is perinephric soft tissue stranding adjacent to a slightly complex cyst on the lower pole right kidney with slight abnormal perfusion to the adjacent  renal parenchyma. This lesion is approximately 3.2 cm in size. There is slight enhancement of the mucosa of the renal pelvis and the right ureter is slightly prominent with enhancement of the mucosa. The left kidney appears normal. Bladder is normal. Adrenal glands are normal. Stomach/Bowel: Stomach is within normal limits. Appendix has been removed. No evidence of bowel wall thickening, distention, or inflammatory changes. Vascular/Lymphatic: Aortic atherosclerosis. No enlarged abdominal or pelvic lymph nodes. Reproductive: Status post hysterectomy. No adnexal masses. Other: Small amount of fluid in the pelvic cul-de-sac. No free air. No abdominal hernia. Musculoskeletal: Old compression fracture of L1. Previous lumbar fusion at L4-5 and L5-S1. Severe degenerative disc disease at L2-3 and L3-4. No acute bone abnormality. IMPRESSION: 1. Slightly complex cyst on the lower pole of the right kidney with adjacent perinephric soft tissue stranding and enhancement of the mucosa of the right renal pelvis and right ureter consistent with pyelonephritis. 2. No other significant abnormality. 3. Aortic atherosclerosis. 4. Old compression fracture of L1. Previous lumbar fusion at L4-5 and L5-S1. 5. Small amount of nonspecific fluid in the pelvic cul-de-sac. Aortic Atherosclerosis (ICD10-I70.0). Electronically Signed   By: JLorriane ShireM.D.   On: 06/03/2019 21:50   DG Chest Port 1 View  Result Date: 06/03/2019 CLINICAL DATA:  Fever. EXAM: PORTABLE CHEST 1 VIEW COMPARISON:  None. FINDINGS: Very mildly increased lung markings are seen with a trace amount of atelectasis noted within the left lung base. There is no evidence of a pleural effusion or pneumothorax. The heart size and mediastinal contours are within normal limits. There is marked severity calcification of the aortic arch. The visualized skeletal structures are unremarkable. IMPRESSION: No active disease. Electronically Signed   By: TVirgina NorfolkM.D.   On:  06/03/2019 20:08     Medications   Scheduled Meds: . budesonide  2 mL Inhalation BID  . enoxaparin (LOVENOX) injection  40 mg Subcutaneous Q24H  . rOPINIRole  0.5 mg Oral BID   And  . rOPINIRole  1.5 mg Oral QHS  . simvastatin  10 mg Oral QHS  . sodium chloride flush  3 mL Intravenous Q12H   Continuous Infusions: . meropenem (MERREM) IV 1 g (06/04/19 1331)  . sodium phosphate  Dextrose 5% IVPB 20 mmol (06/04/19 0916)  LOS: 0 days    Time spent: 40 minutes    Ezekiel Slocumb, DO [facility]   If 7PM-7AM, please contact night-coverage [email or web address] 06/04/2019, 1:56 PM

## 2019-06-04 NOTE — Consult Note (Signed)
PHARMACY - PHYSICIAN COMMUNICATION CRITICAL VALUE ALERT - BLOOD CULTURE IDENTIFICATION (BCID)  Erin Petersen is an 79 y.o. female who presented to [facility] on 06/03/2019 with a chief complaint of fever and diarrhea  Assessment: 1 of 4 bottles (aerobic), GNR, Enterobacteriaciae, E. Coli, KPC not detected (include suspected source if known)  Name of physician (or Provider) Contacted: Dr. Arbutus Ped   Current antibiotics: Ceftriaxone   Changes to prescribed antibiotics recommended: Meropenem 1 g q12H Recommendations accepted by provider  Results for orders placed or performed during the hospital encounter of 06/03/19  Blood Culture ID Panel (Reflexed) (Collected: 06/03/2019  7:26 PM)  Result Value Ref Range   Enterococcus species NOT DETECTED NOT DETECTED   Vancomycin resistance NOT DETECTED NOT DETECTED   Listeria monocytogenes NOT DETECTED NOT DETECTED   Staphylococcus species NOT DETECTED NOT DETECTED   Staphylococcus aureus (BCID) NOT DETECTED NOT DETECTED   Methicillin resistance NOT DETECTED NOT DETECTED   Streptococcus species NOT DETECTED NOT DETECTED   Streptococcus agalactiae NOT DETECTED NOT DETECTED   Streptococcus pneumoniae NOT DETECTED NOT DETECTED   Streptococcus pyogenes NOT DETECTED NOT DETECTED   Acinetobacter baumannii NOT DETECTED NOT DETECTED   Enterobacteriaceae species DETECTED (A) NOT DETECTED   Enterobacter cloacae complex NOT DETECTED NOT DETECTED   Escherichia coli DETECTED (A) NOT DETECTED   Klebsiella oxytoca NOT DETECTED NOT DETECTED   Klebsiella pneumoniae NOT DETECTED NOT DETECTED   Proteus species NOT DETECTED NOT DETECTED   Serratia marcescens NOT DETECTED NOT DETECTED   Carbapenem resistance NOT DETECTED NOT DETECTED   Haemophilus influenzae NOT DETECTED NOT DETECTED   Neisseria meningitidis NOT DETECTED NOT DETECTED   Pseudomonas aeruginosa NOT DETECTED NOT DETECTED   Candida albicans NOT DETECTED NOT DETECTED   Candida glabrata NOT DETECTED NOT  DETECTED   Candida krusei NOT DETECTED NOT DETECTED   Candida parapsilosis NOT DETECTED NOT DETECTED   Candida tropicalis NOT DETECTED NOT DETECTED    Oswald Hillock 06/04/2019  10:47 AM

## 2019-06-04 NOTE — Progress Notes (Signed)
*  PRELIMINARY RESULTS* Echocardiogram 2D Echocardiogram has been performed.  Erin Petersen 06/04/2019, 2:52 PM

## 2019-06-04 NOTE — Plan of Care (Signed)
Patient is alert and oriented. Her temperature is 103. She was given tylenol in the ED and I gave her tramadol for her headache.  Will reassess around 0200. Christene Slates  06/04/2019  1:46 AM

## 2019-06-04 NOTE — Progress Notes (Signed)
Nurse tech rechecked temperature and it was 100.8 axillary. Will continue to monitor.  Christene Slates

## 2019-06-04 NOTE — Progress Notes (Signed)
Spoke with daughter Zacarias Pontes. Added Zacarias Pontes as one of the designated visitors. Updated Lora on patient's status.   Fuller Mandril, RN

## 2019-06-04 NOTE — Telephone Encounter (Signed)
Erin Petersen (DPR signed), pts daughter left v/m that pt was admitted to [facility] on 06/03/19 and Lora wants D Carlean Purl FNP to be aware pt was admitted for kidney infection and low K with dehydration and Erin Petersen wants to know how to get an update on pts condition. Pt seems confused with temp 102. Erin Petersen said pt does not want visitors at this time but due to confusion Erin Petersen is not sure this is correct. Erin Petersen has not heard from anyone at [facility] about pt; I was going to transfer Erin Petersen to [facility] nursing unit but I spoke with Bess Harvest at [facility] nursing unit and she asked if Erin Petersen would cb on [phone] and ask for nurse Misty. Lora notified as instructed and voiced understanding and very appreciative. FYI to Glenda Chroman FNP.

## 2019-06-04 NOTE — Telephone Encounter (Signed)
Noted  

## 2019-06-05 ENCOUNTER — Encounter: Payer: Self-pay | Admitting: Physical Therapy

## 2019-06-05 DIAGNOSIS — E876 Hypokalemia: Secondary | ICD-10-CM

## 2019-06-05 LAB — BASIC METABOLIC PANEL
Anion gap: 7 (ref 5–15)
BUN: 8 mg/dL (ref 8–23)
CO2: 27 mmol/L (ref 22–32)
Calcium: 7.5 mg/dL — ABNORMAL LOW (ref 8.9–10.3)
Chloride: 100 mmol/L (ref 98–111)
Creatinine, Ser: 0.63 mg/dL (ref 0.44–1.00)
GFR calc Af Amer: >60 mL/min (ref >60)
GFR calc non Af Amer: >60 mL/min (ref >60)
Glucose, Bld: 121 mg/dL — ABNORMAL HIGH (ref 70–99)
Potassium: 2.8 mmol/L — ABNORMAL LOW (ref 3.5–5.1)
Sodium: 134 mmol/L — ABNORMAL LOW (ref 135–145)

## 2019-06-05 LAB — PHOSPHORUS: Phosphorus: 2.2 mg/dL — ABNORMAL LOW (ref 2.5–4.6)

## 2019-06-05 LAB — MAGNESIUM: Magnesium: 2.3 mg/dL (ref 1.7–2.4)

## 2019-06-05 MED ORDER — POTASSIUM CHLORIDE CRYS ER 20 MEQ PO TBCR
40.0000 meq | EXTENDED_RELEASE_TABLET | Freq: Once | ORAL | Status: AC
  Administered 2019-06-05: 40 meq via ORAL
  Filled 2019-06-05: qty 2

## 2019-06-05 MED ORDER — CHLORHEXIDINE GLUCONATE CLOTH 2 % EX PADS
6.0000 | MEDICATED_PAD | Freq: Every day | CUTANEOUS | Status: DC
  Administered 2019-06-06 – 2019-06-07 (×2): 6 via TOPICAL

## 2019-06-05 MED ORDER — POTASSIUM CHLORIDE 2 MEQ/ML IV SOLN
INTRAVENOUS | Status: DC
  Filled 2019-06-05 (×2): qty 1000

## 2019-06-05 MED ORDER — SODIUM CHLORIDE 0.9 % IV SOLN
1.0000 g | Freq: Three times a day (TID) | INTRAVENOUS | Status: DC
  Administered 2019-06-05 – 2019-06-06 (×2): 1 g via INTRAVENOUS
  Filled 2019-06-05 (×5): qty 1

## 2019-06-05 MED ORDER — SODIUM CHLORIDE 0.9% FLUSH: 10.0000 mL | INTRAVENOUS | Status: DC | PRN

## 2019-06-05 MED ORDER — POTASSIUM PHOSPHATES 15 MMOLE/5ML IV SOLN
20.0000 mmol | Freq: Once | INTRAVENOUS | Status: AC
  Administered 2019-06-05: 10:00:00 20 mmol via INTRAVENOUS
  Filled 2019-06-05: qty 6.67

## 2019-06-05 NOTE — Progress Notes (Signed)
PROGRESS NOTE    Erin Petersen   KHV:[id number]  DOB: 03-Oct-1940  PCP: Elby Beck, FNP    DOA: 06/03/2019 LOS: 1    Brief Narrative  Erin Petersen is a 79 y.o. female with medical history significant for  interstitial cystitis, asthma, GERD, small fiber neuropathy, restless leg syndrome, and OSA on CPAP who presented to the ED on 4/6 for evaluation of fevers and diarrhea x 2 days.  Associated symptoms of intermittent fevers with rigors, nausea without emesis, abdominal cramping, fatigue, generalized weakness, body aches and lightheadedness.  In the ED, borderline BP 103/56, HR 92, RR 20, temp 100.7 (later up to 103).  Labs with marked electrolytes abnormalities, WBC 10.7k, normal lipase, normal lactic acid.  Covid-19 negative.  UA consistent with infection, sent for cultures.  C diff and GI pathogen panel negative.  CXR negative.  CT abdomen/pelvis showed R pyelonephritis.  Patient's initial blood culture returned positive for GNR's.  Antibiotics changed to meropenem pending identification and susceptibilities.  Treated with IV fluids, potassium and IV antibiotics.  Admitted to hospitalist service.    Assessment & Plan   Principal Problem:   Gastroenteritis Active Problems:   Pyelonephritis of right kidney   Hypokalemia   Hyponatremia   Bacteremia due to Gram-negative bacteria   Benign essential hypertension   OSA on CPAP   High cholesterol   Restless legs syndrome   Small fiber neuropathy   Asthma   Gastroenteritis, Nausea/Vomiting, Diarrhea Patient presented with persistent watery, nonbloody diarrhea.  C. difficile and GI panel negative.  COVID-19 negative as well.  Suspect this is a viral gastroenteritis versus secondary to bacteremia and pyelonephritis.  Diarrhea mproved with Imodium.   --Continue supportive care with IV fluids, antiemetics as needed, Imodium as needed --Advance diet as tolerated. --Diet advanced to soft at lunch today --IV fluids: LR + 64mq KCl @  75 /hr  Bacteremia due to Gram-negative bacteria Complicated UTI with Pyelonephritis of right kidney Patient presented with suprapubic pain, UA consistent with UTI, CT abdomen pelvis with changes consistent with right-sided pyelonephritis.  Patient was initially treated with Rocephin based on prior cultures showing pansensitive E. coli.  Initial blood culture subsequently grew gram-negative rods.  Antibiotics switched to meropenem pending identification and susceptibility. --Follow-up urine and blood cultures --Repeat blood culture tomorrow --Continue meropenem for now --Echo   Hypophosphatemia  Hypokalemia  Hypocalcemia --Monitor daily and replete as needed --maintain K>4.0 --pharmacy consulted   Hyponatremia -likely hypovolemic in the setting of diarrhea.  Improved. --IV hydration as above  Benign essential hypertension -chronic, stable, currently borderline low secondary to hypovolemia.  Home regimen is Norvasc 5 mg, HCTZ 25 mg. --Hold HCTZ given hyponatremia --Hold Norvasc as well due to borderline blood pressures  OSA on CPAP -CPAP ordered  Hyperlipidemia - continue home statin  Restless legs syndrome - Requip on hold due to BP's borderline    Small fiber neuropathy - Lyrica held due to BP's borderline  Asthma -not acutely exacerbated.  Continue home Flovent  Obesity: Body mass index is 34.23 kg/m.    DVT prophylaxis: Lovenox  Diet Orders (From admission, onward)    Start     Ordered   06/04/19 1143  DIET SOFT Room service appropriate? Yes; Fluid consistency: Thin  Diet effective now    Question Answer Comment  Room service appropriate? Yes   Fluid consistency: Thin      06/04/19 1142            Code Status: Full Code  Subjective 06/05/19    Patient seen at bedside this morning.  No acute events reported overnight.  She reports constant nausea and a headache.  Not vomiting.  Had more diarrhea this AM.  She states concern she's not feeling better  yet.   Disposition Plan & Communication   Dispo & Barriers: Gram-negative bacteremia with identification and susceptibilities pending, patient not tolerating PO Coming from: Home Exp d/c date: 4/9 Medically stable for d/c?  No  Family Communication: None at bedside during encounter, will attempt to call   Consults, Procedures, Significant Events   Consultants:   None  Procedures:   None  Antimicrobials:   Rocephin: 4/6 >> 4/7  Meropenem: 4/7 >>    Objective   Vitals:   06/04/19 1608 06/04/19 1917 06/04/19 1939 06/05/19 0415  BP:   (!) 110/52 110/61  Pulse:   71 70  Resp:   16 16  Temp: (!) 102.9 F (39.4 C) 98.3 F (36.8 C) 98.7 F (37.1 C) 98.3 F (36.8 C)  TempSrc: Oral  Oral Oral  SpO2:   97% 94%  Weight:      Height:        Intake/Output Summary (Last 24 hours) at 06/05/2019 0717 Last data filed at 06/05/2019 0441 Gross per 24 hour  Intake 1073.91 ml  Output 1275 ml  Net -201.09 ml   Filed Weights   06/03/19 1722 06/04/19 0005  Weight: 72.6 kg 74.3 kg    Physical Exam:  General exam: awake, alert, no acute distress, overweight HEENT: atraumatic, clear conjunctiva, anicteric sclera, moist mucus membranes, hearing grossly normal  Respiratory system: CTAB, no wheezes, rales or rhonchi, normal respiratory effort. Cardiovascular system: normal S1/S2, RRR, no pedal edema.   Gastrointestinal system: soft, mild epigastric tenderness on palpation, no distention, hyperactive bowel sounds. Central nervous system: A&O x4. no gross focal neurologic deficits, normal speech Extremities: moves all, no edema, normal tone Psychiatry: normal mood, congruent affect, judgement and insight appear normal  Labs   Data Reviewed: I have personally reviewed following labs and imaging studies  CBC: Recent Labs  Lab 06/03/19 1724 06/04/19 0449  WBC 10.7* 9.8  HGB 12.1 11.7*  HCT 36.1 35.2*  MCV 81.9 82.4  PLT 226 465   Basic Metabolic Panel: Recent Labs  Lab  06/03/19 1724 06/04/19 0449 06/05/19 0559  NA 130* 134* 134*  K 2.5* 3.9 2.8*  CL 92* 98 100  CO2 25 25 27   GLUCOSE 131* 103* 121*  BUN 27* 17 8  CREATININE 1.03* 0.82 0.63  CALCIUM 7.8* 7.7* 7.5*  MG 1.8 2.6* 2.3  PHOS  --  1.8* 2.2*   GFR: Estimated Creatinine Clearance: 50.5 mL/min (by C-G formula based on SCr of 0.63 mg/dL). Liver Function Tests: Recent Labs  Lab 06/03/19 1724  AST 28  ALT 32  ALKPHOS 75  BILITOT 0.8  PROT 6.7  ALBUMIN 3.3*   Recent Labs  Lab 06/03/19 1724  LIPASE 17   No results for input(s): AMMONIA in the last 168 hours. Coagulation Profile: No results for input(s): INR, PROTIME in the last 168 hours. Cardiac Enzymes: No results for input(s): CKTOTAL, CKMB, CKMBINDEX, TROPONINI in the last 168 hours. BNP (last 3 results) No results for input(s): PROBNP in the last 8760 hours. HbA1C: No results for input(s): HGBA1C in the last 72 hours. CBG: No results for input(s): GLUCAP in the last 168 hours. Lipid Profile: No results for input(s): CHOL, HDL, LDLCALC, TRIG, CHOLHDL, LDLDIRECT in the last 72 hours. Thyroid  Function Tests: No results for input(s): TSH, T4TOTAL, FREET4, T3FREE, THYROIDAB in the last 72 hours. Anemia Panel: No results for input(s): VITAMINB12, FOLATE, FERRITIN, TIBC, IRON, RETICCTPCT in the last 72 hours. Sepsis Labs: Recent Labs  Lab 06/03/19 1724 06/03/19 1926  PROCALCITON 83.89  --   LATICACIDVEN  --  0.9    Recent Results (from the past 240 hour(s))  Blood Culture (routine x 2)     Status: None (Preliminary result)   Collection Time: 06/03/19  7:26 PM   Specimen: BLOOD  Result Value Ref Range Status   Specimen Description BLOOD LEFT ANTECUBITAL  Final   Special Requests   Final    BOTTLES DRAWN AEROBIC AND ANAEROBIC Blood Culture adequate volume   Culture  Setup Time   Final    Organism ID to follow GRAM NEGATIVE RODS AEROBIC BOTTLE ONLY CRITICAL RESULT CALLED TO, READ BACK BY AND VERIFIED  WITH: CHRISTINE KATSOUDAS AT 1638 06/04/19 [place] Performed at [facility], [address] [place], [place] [address]    Culture GRAM NEGATIVE RODS  Final   Report Status PENDING  Incomplete  Blood Culture (routine x 2)     Status: None (Preliminary result)   Collection Time: 06/03/19  7:26 PM   Specimen: BLOOD  Result Value Ref Range Status   Specimen Description BLOOD RIGHT ANTECUBITAL  Final   Special Requests   Final    BOTTLES DRAWN AEROBIC AND ANAEROBIC Blood Culture adequate volume   Culture   Final    NO GROWTH < 12 HOURS Performed at [facility], [address] [place], [place] [address]    Report Status PENDING  Incomplete  Blood Culture ID Panel (Reflexed)     Status: Abnormal   Collection Time: 06/03/19  7:26 PM  Result Value Ref Range Status   Enterococcus species NOT DETECTED NOT DETECTED Final   Vancomycin resistance NOT DETECTED NOT DETECTED Final   Listeria monocytogenes NOT DETECTED NOT DETECTED Final   Staphylococcus species NOT DETECTED NOT DETECTED Final   Staphylococcus aureus (BCID) NOT DETECTED NOT DETECTED Final   Methicillin resistance NOT DETECTED NOT DETECTED Final   Streptococcus species NOT DETECTED NOT DETECTED Final   Streptococcus agalactiae NOT DETECTED NOT DETECTED Final   Streptococcus pneumoniae NOT DETECTED NOT DETECTED Final   Streptococcus pyogenes NOT DETECTED NOT DETECTED Final   Acinetobacter baumannii NOT DETECTED NOT DETECTED Final   Enterobacteriaceae species DETECTED (A) NOT DETECTED Final    Comment: CRITICAL RESULT CALLED TO, READ BACK BY AND VERIFIED WITH:  CHRISTINE KATSOUDAS AT 1224 06/04/19 [facility] Enterobacteriaceae represent a large family of gram-negative bacteria, not a single organism.    Enterobacter cloacae complex NOT DETECTED NOT DETECTED Final   Escherichia coli DETECTED (A) NOT DETECTED Final    Comment: CRITICAL RESULT CALLED TO, READ BACK BY AND VERIFIED WITH:  [place] AT 8250 06/04/19  SD[facility]    Klebsiella oxytoca NOT DETECTED NOT DETECTED Final   Klebsiella pneumoniae NOT DETECTED NOT DETECTED Final   Proteus species NOT DETECTED NOT DETECTED Final   Serratia marcescens NOT DETECTED NOT DETECTED Final   Carbapenem resistance NOT DETECTED NOT DETECTED Final   Haemophilus influenzae NOT DETECTED NOT DETECTED Final   Neisseria meningitidis NOT DETECTED NOT DETECTED Final   Pseudomonas aeruginosa NOT DETECTED NOT DETECTED Final   Candida albicans NOT DETECTED NOT DETECTED Final   Candida glabrata NOT DETECTED NOT DETECTED Final   Candida krusei NOT DETECTED NOT DETECTED Final   Candida parapsilosis  NOT DETECTED NOT DETECTED Final   Candida tropicalis NOT DETECTED NOT DETECTED Final    Comment: Performed at [facility], [address] [place], [place] [address]  Gastrointestinal Panel by PCR , Stool     Status: None   Collection Time: 06/03/19  8:27 PM   Specimen: Stool  Result Value Ref Range Status   Campylobacter species NOT DETECTED NOT DETECTED Final   Plesimonas shigelloides NOT DETECTED NOT DETECTED Final   Salmonella species NOT DETECTED NOT DETECTED Final   Yersinia enterocolitica NOT DETECTED NOT DETECTED Final   Vibrio species NOT DETECTED NOT DETECTED Final   Vibrio cholerae NOT DETECTED NOT DETECTED Final   Enteroaggregative E coli (EAEC) NOT DETECTED NOT DETECTED Final   Enteropathogenic E coli (EPEC) NOT DETECTED NOT DETECTED Final   Enterotoxigenic E coli (ETEC) NOT DETECTED NOT DETECTED Final   Shiga like toxin producing E coli (STEC) NOT DETECTED NOT DETECTED Final   Shigella/Enteroinvasive E coli (EIEC) NOT DETECTED NOT DETECTED Final   Cryptosporidium NOT DETECTED NOT DETECTED Final   Cyclospora cayetanensis NOT DETECTED NOT DETECTED Final   Entamoeba histolytica NOT DETECTED NOT DETECTED Final   Giardia lamblia NOT DETECTED NOT DETECTED Final   Adenovirus F40/41 NOT DETECTED NOT DETECTED Final   Astrovirus NOT DETECTED NOT DETECTED  Final   Norovirus GI/GII NOT DETECTED NOT DETECTED Final   Rotavirus A NOT DETECTED NOT DETECTED Final   Sapovirus (I, II, IV, and V) NOT DETECTED NOT DETECTED Final    Comment: Performed at [facility], [address] [place], [place] [address]  C Difficile Quick Screen w PCR reflex     Status: None   Collection Time: 06/03/19  8:27 PM   Specimen: Stool  Result Value Ref Range Status   C Diff antigen NEGATIVE NEGATIVE Final   C Diff toxin NEGATIVE NEGATIVE Final   C Diff interpretation No C. difficile detected.  Final    Comment: Performed at [facility], [place], [place] [address]  SARS CORONAVIRUS 2 (TAT 6-24 HRS) Nasopharyngeal Nasopharyngeal Swab     Status: None   Collection Time: 06/03/19 10:53 PM   Specimen: Nasopharyngeal Swab  Result Value Ref Range Status   SARS Coronavirus 2 NEGATIVE NEGATIVE Final    Comment: (NOTE) SARS-CoV-2 target nucleic acids are NOT DETECTED. The SARS-CoV-2 RNA is generally detectable in upper and lower respiratory specimens during the acute phase of infection. Negative results do not preclude SARS-CoV-2 infection, do not rule out co-infections with other pathogens, and should not be used as the sole basis for treatment or other patient management decisions. Negative results must be combined with clinical observations, patient history, and epidemiological information. The expected result is Negative. Fact Sheet for Patients: SugarRoll.be Fact Sheet for Healthcare Providers: https://www.woods-mathews.com/ This test is not yet approved or cleared by the [place] FDA and  has been authorized for detection and/or diagnosis of SARS-CoV-2 by FDA under an Emergency Use Authorization (EUA). This EUA will remain  in effect (meaning this test can be used) for the duration of the COVID-19 declaration under Section 56 4(b)(1) of the Act, 21 U.S.C. section 360bbb-3(b)(1),  unless the authorization is terminated or revoked sooner. Performed at [place], [place] [address], [place], [place] [address]       Imaging Studies   CT ABDOMEN PELVIS W CONTRAST  Result Date: 06/03/2019 CLINICAL DATA:  Left-sided abdominal pain. Fever. Diarrhea. EXAM: CT ABDOMEN AND PELVIS WITH CONTRAST TECHNIQUE:  Multidetector CT imaging of the abdomen and pelvis was performed using the standard protocol following bolus administration of intravenous contrast. CONTRAST:  164m OMNIPAQUE IOHEXOL 300 MG/ML  SOLN COMPARISON:  None. FINDINGS: Lower chest: Normal. Hepatobiliary: No focal liver abnormality is seen. No gallstones, gallbladder wall thickening, or biliary dilatation. Pancreas: Unremarkable. No pancreatic ductal dilatation or surrounding inflammatory changes. Spleen: Normal in size without focal abnormality. Adrenals/Urinary Tract: There is perinephric soft tissue stranding adjacent to a slightly complex cyst on the lower pole right kidney with slight abnormal perfusion to the adjacent renal parenchyma. This lesion is approximately 3.2 cm in size. There is slight enhancement of the mucosa of the renal pelvis and the right ureter is slightly prominent with enhancement of the mucosa. The left kidney appears normal. Bladder is normal. Adrenal glands are normal. Stomach/Bowel: Stomach is within normal limits. Appendix has been removed. No evidence of bowel wall thickening, distention, or inflammatory changes. Vascular/Lymphatic: Aortic atherosclerosis. No enlarged abdominal or pelvic lymph nodes. Reproductive: Status post hysterectomy. No adnexal masses. Other: Small amount of fluid in the pelvic cul-de-sac. No free air. No abdominal hernia. Musculoskeletal: Old compression fracture of L1. Previous lumbar fusion at L4-5 and L5-S1. Severe degenerative disc disease at L2-3 and L3-4. No acute bone abnormality. IMPRESSION: 1. Slightly complex cyst on the lower pole of the right kidney with  adjacent perinephric soft tissue stranding and enhancement of the mucosa of the right renal pelvis and right ureter consistent with pyelonephritis. 2. No other significant abnormality. 3. Aortic atherosclerosis. 4. Old compression fracture of L1. Previous lumbar fusion at L4-5 and L5-S1. 5. Small amount of nonspecific fluid in the pelvic cul-de-sac. Aortic Atherosclerosis (ICD10-I70.0). Electronically Signed   By: JLorriane ShireM.D.   On: 06/03/2019 21:50   DG Chest Port 1 View  Result Date: 06/03/2019 CLINICAL DATA:  Fever. EXAM: PORTABLE CHEST 1 VIEW COMPARISON:  None. FINDINGS: Very mildly increased lung markings are seen with a trace amount of atelectasis noted within the left lung base. There is no evidence of a pleural effusion or pneumothorax. The heart size and mediastinal contours are within normal limits. There is marked severity calcification of the aortic arch. The visualized skeletal structures are unremarkable. IMPRESSION: No active disease. Electronically Signed   By: TVirgina NorfolkM.D.   On: 06/03/2019 20:08   ECHOCARDIOGRAM COMPLETE  Result Date: 06/04/2019    ECHOCARDIOGRAM REPORT   Patient Name:   BANUSHREE DORSIDate of Exam: 06/04/2019 Medical Rec #:  [id number]     Height:       58.0 in Accession #:    [id number]    Weight:       163.8 lb Date of Birth:  [birth year]     BSA:          1.673 m Patient Age:    89years       BP:           111/55 mmHg Patient Gender: F              HR:           79 bpm. Exam Location:  [facility] Procedure: 2D Echo, Color Doppler and Cardiac Doppler Indications:     R78.81 Bacteremia  History:         Patient has no prior history of Echocardiogram examinations.                  Risk Factors:Hypertension and Dyslipidemia.  Sonographer:  Charmayne Sheer RDCS (AE) Referring Phys:  [id number] Floyce Stakes Morley Gaumer Diagnosing Phys: Kate Sable MD IMPRESSIONS  1. Left ventricular ejection fraction, by estimation, is 55 to 60%. The left ventricle has normal function. The  left ventricle has no regional wall motion abnormalities. Left ventricular diastolic parameters were normal.  2. Right ventricular systolic function is normal. The right ventricular size is normal. There is moderately elevated pulmonary artery systolic pressure.  3. Left atrial size was mildly dilated.  4. The mitral valve is normal in structure. No evidence of mitral valve regurgitation. No evidence of mitral stenosis.  5. The aortic valve is normal in structure. Aortic valve regurgitation is not visualized. No aortic stenosis is present.  6. The inferior vena cava is dilated in size with >50% respiratory variability, suggesting right atrial pressure of 8 mmHg. FINDINGS  Left Ventricle: Left ventricular ejection fraction, by estimation, is 55 to 60%. The left ventricle has normal function. The left ventricle has no regional wall motion abnormalities. The left ventricular internal cavity size was normal in size. There is  no left ventricular hypertrophy. Left ventricular diastolic parameters were normal. Right Ventricle: The right ventricular size is normal. No increase in right ventricular wall thickness. Right ventricular systolic function is normal. There is moderately elevated pulmonary artery systolic pressure. The tricuspid regurgitant velocity is 2.84 m/s, and with an assumed right atrial pressure of 8 mmHg, the estimated right ventricular systolic pressure is 76.5 mmHg. Left Atrium: Left atrial size was mildly dilated. Right Atrium: Right atrial size was normal in size. Pericardium: There is no evidence of pericardial effusion. Mitral Valve: The mitral valve is normal in structure. Normal mobility of the mitral valve leaflets. No evidence of mitral valve regurgitation. No evidence of mitral valve stenosis. MV peak gradient, 7.4 mmHg. The mean mitral valve gradient is 3.0 mmHg. Tricuspid Valve: The tricuspid valve is normal in structure. Tricuspid valve regurgitation is not demonstrated. No evidence of  tricuspid stenosis. Aortic Valve: The aortic valve is normal in structure. Aortic valve regurgitation is not visualized. No aortic stenosis is present. Aortic valve mean gradient measures 4.0 mmHg. Aortic valve peak gradient measures 8.0 mmHg. Aortic valve area, by VTI measures 2.51 cm. Pulmonic Valve: The pulmonic valve was not well visualized. Pulmonic valve regurgitation is not visualized. No evidence of pulmonic stenosis. Aorta: The aortic root is normal in size and structure. Venous: The inferior vena cava is dilated in size with greater than 50% respiratory variability, suggesting right atrial pressure of 8 mmHg. IAS/Shunts: No atrial level shunt detected by color flow Doppler.  LEFT VENTRICLE PLAX 2D LVIDd:         4.37 cm  Diastology LVIDs:         3.32 cm  LV e' lateral:   8.59 cm/s LV PW:         1.02 cm  LV E/e' lateral: 12.0 LV IVS:        0.95 cm  LV e' medial:    8.16 cm/s LVOT diam:     2.00 cm  LV E/e' medial:  12.6 LV SV:         67 LV SV Index:   40 LVOT Area:     3.14 cm  RIGHT VENTRICLE RV Basal diam:  2.99 cm LEFT ATRIUM             Index       RIGHT ATRIUM           Index LA diam:  4.60 cm 2.75 cm/m  RA Area:     11.80 cm LA Vol (A2C):   54.2 ml 32.40 ml/m RA Volume:   28.20 ml  16.86 ml/m LA Vol (A4C):   55.1 ml 32.94 ml/m LA Biplane Vol: 55.5 ml 33.18 ml/m  AORTIC VALVE                   PULMONIC VALVE AV Area (Vmax):    2.45 cm    PV Vmax:       1.09 m/s AV Area (Vmean):   2.63 cm    PV Vmean:      73.700 cm/s AV Area (VTI):     2.51 cm    PV VTI:        0.206 m AV Vmax:           141.00 cm/s PV Peak grad:  4.8 mmHg AV Vmean:          94.700 cm/s PV Mean grad:  3.0 mmHg AV VTI:            0.265 m AV Peak Grad:      8.0 mmHg AV Mean Grad:      4.0 mmHg LVOT Vmax:         110.00 cm/s LVOT Vmean:        79.300 cm/s LVOT VTI:          0.212 m LVOT/AV VTI ratio: 0.80  AORTA Ao Root diam: 3.00 cm MITRAL VALVE                TRICUSPID VALVE MV Area (PHT): 3.99 cm     TR Peak grad:    32.3 mmHg MV Peak grad:  7.4 mmHg     TR Vmax:        284.00 cm/s MV Mean grad:  3.0 mmHg MV Vmax:       1.36 m/s     SHUNTS MV Vmean:      80.7 cm/s    Systemic VTI:  0.21 m MV Decel Time: 190 msec     Systemic Diam: 2.00 cm MV E velocity: 103.00 cm/s MV A velocity: 93.60 cm/s MV E/A ratio:  1.10 Kate Sable MD Electronically signed by Kate Sable MD Signature Date/Time: 06/04/2019/3:28:02 PM    Final      Medications   Scheduled Meds: . budesonide  2 mL Inhalation BID  . enoxaparin (LOVENOX) injection  40 mg Subcutaneous Q24H  . rOPINIRole  0.5 mg Oral BID   And  . rOPINIRole  1.5 mg Oral QHS  . simvastatin  10 mg Oral QHS  . sodium chloride flush  3 mL Intravenous Q12H   Continuous Infusions: . meropenem (MERREM) IV Stopped (06/04/19 2302)       LOS: 1 day    Time spent: 30 minutes    Ezekiel Slocumb, DO [facility]   If 7PM-7AM, please contact night-coverage [email or web address] 06/05/2019, 7:17 AM

## 2019-06-05 NOTE — Consult Note (Signed)
PHARMACY CONSULT NOTE - FOLLOW UP  Pharmacy Consult for Electrolyte Monitoring and Replacement   Recent Labs: Potassium (mmol/L)  Date Value  06/05/2019 2.8 (L)   Magnesium (mg/dL)  Date Value  06/05/2019 2.3   Calcium (mg/dL)  Date Value  06/05/2019 7.5 (L)   Albumin (g/dL)  Date Value  06/03/2019 3.3 (L)   Phosphorus (mg/dL)  Date Value  06/05/2019 2.2 (L)   Sodium (mmol/L)  Date Value  06/05/2019 134 (L)   Corrected Ca 8.3  Assessment: 79 y.o. female with medical history significant for asthma, GERD, small fiber neuropathy, restless leg syndrome, and OSA on CPAP who presents to the ED for evaluation of fevers and diarrhea. Patient with E.coli UTI and bacteremia.    Goal of Therapy:  WNL  Plan:  Pharmacy ordered potassium phosphate 20 mmol IV x 1 and potassium 40 mEq PO x 1. MD ordered LR w/ 40 K to run at 75 ml/hr. Will follow up all electrolytes with morning labs.  Tawnya Crook ,PharmD Clinical Pharmacist 06/05/2019 1:41 PM

## 2019-06-05 NOTE — Progress Notes (Signed)
PHARMACY NOTE:  ANTIMICROBIAL RENAL DOSAGE ADJUSTMENT  Current antimicrobial regimen includes a mismatch between antimicrobial dosage and estimated renal function.  As per policy approved by the Pharmacy & Therapeutics and Medical Executive Committees, the antimicrobial dosage will be adjusted accordingly.  Current antimicrobial dosage:  Meropenem 1 g IV q12h  Indication: E.coli bacteremia (pending sensitivities)  Renal Function:  Estimated Creatinine Clearance: 50.5 mL/min (by C-G formula based on SCr of 0.63 mg/dL).     Antimicrobial dosage has been changed to:  Meropenem 1 g IV q8h    Thank you for allowing pharmacy to be a part of this patient's care.  Dorena Bodo, PharmD 06/05/2019 1:43 PM

## 2019-06-06 DIAGNOSIS — I10 Essential (primary) hypertension: Secondary | ICD-10-CM

## 2019-06-06 LAB — CULTURE, BLOOD (ROUTINE X 2): Special Requests: ADEQUATE

## 2019-06-06 LAB — URINE CULTURE: Culture: >=100000 — AB

## 2019-06-06 LAB — CBC
HCT: 34.6 % — ABNORMAL LOW (ref 36.0–46.0)
Hemoglobin: 11.3 g/dL — ABNORMAL LOW (ref 12.0–15.0)
MCH: 27.6 pg (ref 26.0–34.0)
MCHC: 32.7 g/dL (ref 30.0–36.0)
MCV: 84.4 fL (ref 80.0–100.0)
Platelets: 308 10*3/uL (ref 150–400)
RBC: 4.1 MIL/uL (ref 3.87–5.11)
RDW: 14.2 % (ref 11.5–15.5)
WBC: 8 10*3/uL (ref 4.0–10.5)
nRBC: 0 % (ref 0.0–0.2)

## 2019-06-06 LAB — BASIC METABOLIC PANEL
Anion gap: 6 (ref 5–15)
BUN: 7 mg/dL — ABNORMAL LOW (ref 8–23)
CO2: 25 mmol/L (ref 22–32)
Calcium: 7.6 mg/dL — ABNORMAL LOW (ref 8.9–10.3)
Chloride: 103 mmol/L (ref 98–111)
Creatinine, Ser: 0.68 mg/dL (ref 0.44–1.00)
GFR calc Af Amer: >60 mL/min (ref >60)
GFR calc non Af Amer: >60 mL/min (ref >60)
Glucose, Bld: 107 mg/dL — ABNORMAL HIGH (ref 70–99)
Potassium: 4.3 mmol/L (ref 3.5–5.1)
Sodium: 134 mmol/L — ABNORMAL LOW (ref 135–145)

## 2019-06-06 LAB — PHOSPHORUS: Phosphorus: 2.1 mg/dL — ABNORMAL LOW (ref 2.5–4.6)

## 2019-06-06 LAB — MAGNESIUM: Magnesium: 2.5 mg/dL — ABNORMAL HIGH (ref 1.7–2.4)

## 2019-06-06 MED ORDER — SODIUM CHLORIDE 0.9 % IV SOLN
INTRAVENOUS | Status: DC | PRN
  Administered 2019-06-06: 40 mL via INTRAVENOUS
  Administered 2019-06-07: 15:00:00 30 mL via INTRAVENOUS

## 2019-06-06 MED ORDER — SODIUM PHOSPHATES 45 MMOLE/15ML IV SOLN
30.0000 mmol | Freq: Once | INTRAVENOUS | Status: AC
  Administered 2019-06-06: 30 mmol via INTRAVENOUS
  Filled 2019-06-06: qty 10

## 2019-06-06 MED ORDER — PREGABALIN 75 MG PO CAPS
150.0000 mg | ORAL_CAPSULE | Freq: Two times a day (BID) | ORAL | Status: DC
  Administered 2019-06-06 – 2019-06-07 (×2): 150 mg via ORAL
  Filled 2019-06-06 (×2): qty 2

## 2019-06-06 MED ORDER — AMLODIPINE BESYLATE 5 MG PO TABS
5.0000 mg | ORAL_TABLET | Freq: Every day | ORAL | Status: DC
  Administered 2019-06-07: 5 mg via ORAL
  Filled 2019-06-06: qty 1

## 2019-06-06 MED ORDER — ROPINIROLE HCL 1 MG PO TABS
0.5000 mg | ORAL_TABLET | Freq: Two times a day (BID) | ORAL | Status: DC
  Administered 2019-06-07: 0.5 mg via ORAL
  Filled 2019-06-06: qty 1

## 2019-06-06 MED ORDER — CEFAZOLIN SODIUM-DEXTROSE 2-4 GM/100ML-% IV SOLN
2.0000 g | Freq: Three times a day (TID) | INTRAVENOUS | Status: DC
  Administered 2019-06-06 – 2019-06-07 (×4): 2 g via INTRAVENOUS
  Filled 2019-06-06 (×8): qty 100

## 2019-06-06 NOTE — Progress Notes (Signed)
PROGRESS NOTE    Schylar Allard   NUU:[id number]  DOB: [birth year]  PCP: Elby Beck, FNP    DOA: 06/03/2019 LOS: 2    Brief Narrative   Erin Petersen is a 79 y.o. female with medical history significant for  interstitial cystitis, asthma, GERD, small fiber neuropathy, restless leg syndrome, and OSA on CPAP who presented to the ED on 4/6 for evaluation of fevers and diarrhea x 2 days.  Associated symptoms of intermittent fevers with rigors, nausea without emesis, abdominal cramping, fatigue, generalized weakness, body aches and lightheadedness.  In the ED, borderline BP 103/56, HR 92, RR 20, temp 100.7 (later up to 103).  Labs with marked electrolytes abnormalities, WBC 10.7k, normal lipase, normal lactic acid.  Covid-19 negative.  UA consistent with infection, sent for cultures.  C diff and GI pathogen panel negative.  CXR negative.  CT abdomen/pelvis showed R pyelonephritis.  Patient's initial blood culture returned positive for GNR's.  Antibiotics changed to meropenem pending identification and susceptibilities.  Treated with IV fluids, potassium and IV antibiotics.  Admitted to hospitalist service.    Assessment & Plan   Principal Problem:   Gastroenteritis Active Problems:   Pyelonephritis of right kidney   Hypokalemia   Hyponatremia   Bacteremia due to Gram-negative bacteria   Benign essential hypertension   OSA on CPAP   High cholesterol   Restless legs syndrome   Small fiber neuropathy   Asthma   Gastroenteritis, Nausea/Vomiting, Diarrhea Patient presented with persistent watery, nonbloody diarrhea.  C. difficile and GI panel negative.  COVID-19 negative as well.  Suspect this is a viral gastroenteritis versus secondary to bacteremia and pyelonephritis.  Diarrhea improved with Imodium.  Continues to have intermittent nausea. --Imodium as needed for diarrhea --Soft diet --IV fluids: stopped --antiemetics PRN  Bacteremia due to Gram-negative bacteria Complicated  UTI with Pyelonephritis of right kidney Patient presented with suprapubic pain, UA consistent with UTI, CT abdomen pelvis with changes consistent with right-sided pyelonephritis.  Patient was initially treated with Rocephin based on prior cultures showing pansensitive E. coli.  Initial blood culture subsequently grew gram-negative rods.  Antibiotics switched to meropenem pending identification and susceptibility.  TTE negative for vegetations.  Repeat cultures no growth to date.   --de-escalate antibiotics to Ancef based on sensitivities --Keflex on d/c to complete 7 day course  Hypophosphatemia - improved Hypokalemia - resolved Hypocalcemia --Monitor daily and replete as needed --maintain K>4.0 --pharmacy consulted for electrolyte replacement  Hyponatremia -likely hypovolemic in the setting of diarrhea.  Improved. --off IV fluids --monitor BMP  Benign essential hypertension -chronic, stable, currently borderline low secondary to hypovolemia.  Home regimen is Norvasc 5 mg, HCTZ 25 mg. --Hold HCTZ given hyponatremia --resume Norvasc tomorrow  OSA on CPAP -CPAP ordered  Hyperlipidemia - continue home statin  Restless legs syndrome - resume Requip    Small fiber neuropathy - resume Lyrica   Asthma -not acutely exacerbated.  Continue home Flovent.  Obesity: Body mass index is 34.23 kg/m.    DVT prophylaxis: Lovenox  Diet Orders (From admission, onward)    Start     Ordered   06/04/19 1143  DIET SOFT Room service appropriate? Yes; Fluid consistency: Thin  Diet effective now    Question Answer Comment  Room service appropriate? Yes   Fluid consistency: Thin      06/04/19 1142            Code Status: Full Code    Subjective 06/06/19    Patient seen at  bedside this morning.  No acute events reported overnight.  She reports feeling a little better today.  Still with some intermittent nausea but no vomiting.  Has been afebrile but reports subjective chills intermittently.   Is eating a little but causes nausea.  States having breakthrough neuropathy and would like Lyrica resumed.  Asked that I call daughter to update her.   Disposition Plan & Communication   Dispo & Barriers: Anticipate discharge home in 24 to 48 hours pending further clinical improvement and adequate p.o. intake and hydration.   Coming from: Home Exp d/c date: 4/9 Medically stable for d/c?  No  Family Communication: Attempted to call daughter, Zacarias Pontes, twice this afternoon but was unsuccessful in reaching her.   Consults, Procedures, Significant Events   Consultants:   None  Procedures:   None  Antimicrobials:   Rocephin: 4/6 >> 4/7  Meropenem: 4/7 >> 4/9  Ancef: 4/9 >>   Objective   Vitals:   06/05/19 0745 06/05/19 1313 06/05/19 1930 06/06/19 0411  BP:  128/63 127/62 137/64  Pulse:  72 74 67  Resp:   16 20  Temp:  98 F (36.7 C) 99.3 F (37.4 C) 98.5 F (36.9 C)  TempSrc:  Oral Oral Oral  SpO2: 95% 97% 95% 95%  Weight:      Height:        Intake/Output Summary (Last 24 hours) at 06/06/2019 0726 Last data filed at 06/06/2019 0300 Gross per 24 hour  Intake 1137.62 ml  Output 300 ml  Net 837.62 ml   Filed Weights   06/03/19 1722 06/04/19 0005  Weight: 72.6 kg 74.3 kg    Physical Exam:  General exam: awake, alert, no acute distress, overweight Respiratory system: CTAB, no wheezes, rales or rhonchi, normal respiratory effort. Cardiovascular system: normal S1/S2, RRR, no pedal edema.   Gastrointestinal system: soft, nontender, no distention, +bowel sounds. Central nervous system: A&O x4. no gross focal neurologic deficits, normal speech Extremities: moves all, no edema, normal tone Psychiatry: normal mood, congruent affect, judgement and insight appear normal  Labs   Data Reviewed: I have personally reviewed following labs and imaging studies  CBC: Recent Labs  Lab 06/03/19 1724 06/04/19 0449 06/06/19 0537  WBC 10.7* 9.8 8.0  HGB 12.1 11.7* 11.3*    HCT 36.1 35.2* 34.6*  MCV 81.9 82.4 84.4  PLT 226 233 893   Basic Metabolic Panel: Recent Labs  Lab 06/03/19 1724 06/04/19 0449 06/05/19 0559 06/06/19 0537  NA 130* 134* 134* 134*  K 2.5* 3.9 2.8* 4.3  CL 92* 98 100 103  CO2 25 25 27 25   GLUCOSE 131* 103* 121* 107*  BUN 27* 17 8 7*  CREATININE 1.03* 0.82 0.63 0.68  CALCIUM 7.8* 7.7* 7.5* 7.6*  MG 1.8 2.6* 2.3 2.5*  PHOS  --  1.8* 2.2* 2.1*   GFR: Estimated Creatinine Clearance: 50.5 mL/min (by C-G formula based on SCr of 0.68 mg/dL). Liver Function Tests: Recent Labs  Lab 06/03/19 1724  AST 28  ALT 32  ALKPHOS 75  BILITOT 0.8  PROT 6.7  ALBUMIN 3.3*   Recent Labs  Lab 06/03/19 1724  LIPASE 17   No results for input(s): AMMONIA in the last 168 hours. Coagulation Profile: No results for input(s): INR, PROTIME in the last 168 hours. Cardiac Enzymes: No results for input(s): CKTOTAL, CKMB, CKMBINDEX, TROPONINI in the last 168 hours. BNP (last 3 results) No results for input(s): PROBNP in the last 8760 hours. HbA1C: No results for input(s): HGBA1C  in the last 72 hours. CBG: No results for input(s): GLUCAP in the last 168 hours. Lipid Profile: No results for input(s): CHOL, HDL, LDLCALC, TRIG, CHOLHDL, LDLDIRECT in the last 72 hours. Thyroid Function Tests: No results for input(s): TSH, T4TOTAL, FREET4, T3FREE, THYROIDAB in the last 72 hours. Anemia Panel: No results for input(s): VITAMINB12, FOLATE, FERRITIN, TIBC, IRON, RETICCTPCT in the last 72 hours. Sepsis Labs: Recent Labs  Lab 06/03/19 1724 06/03/19 1926  PROCALCITON 83.89  --   LATICACIDVEN  --  0.9    Recent Results (from the past 240 hour(s))  Blood Culture (routine x 2)     Status: Abnormal (Preliminary result)   Collection Time: 06/03/19  7:26 PM   Specimen: BLOOD  Result Value Ref Range Status   Specimen Description   Final    BLOOD LEFT ANTECUBITAL Performed at [facility], [address], [place], [place] [address]     Special Requests   Final    BOTTLES DRAWN AEROBIC AND ANAEROBIC Blood Culture adequate volume Performed at [facility], [address], [place], [place] [address]    Culture  Setup Time   Final    GRAM NEGATIVE RODS AEROBIC BOTTLE ONLY CRITICAL RESULT CALLED TO, READ BACK BY AND VERIFIED WITH: [place] AT 1155 06/04/19 SDR    Culture (A)  Final    ESCHERICHIA COLI SUSCEPTIBILITIES TO FOLLOW Performed at [facility], [place] [address], [place], [place] [address]    Report Status PENDING  Incomplete  Blood Culture (routine x 2)     Status: None (Preliminary result)   Collection Time: 06/03/19  7:26 PM   Specimen: BLOOD  Result Value Ref Range Status   Specimen Description BLOOD RIGHT ANTECUBITAL  Final   Special Requests   Final    BOTTLES DRAWN AEROBIC AND ANAEROBIC Blood Culture adequate volume   Culture   Final    NO GROWTH 2 DAYS Performed at [facility], [address] [place], [place] [address]    Report Status PENDING  Incomplete  Blood Culture ID Panel (Reflexed)     Status: Abnormal   Collection Time: 06/03/19  7:26 PM  Result Value Ref Range Status   Enterococcus species NOT DETECTED NOT DETECTED Final   Vancomycin resistance NOT DETECTED NOT DETECTED Final   Listeria monocytogenes NOT DETECTED NOT DETECTED Final   Staphylococcus species NOT DETECTED NOT DETECTED Final   Staphylococcus aureus (BCID) NOT DETECTED NOT DETECTED Final   Methicillin resistance NOT DETECTED NOT DETECTED Final   Streptococcus species NOT DETECTED NOT DETECTED Final   Streptococcus agalactiae NOT DETECTED NOT DETECTED Final   Streptococcus pneumoniae NOT DETECTED NOT DETECTED Final   Streptococcus pyogenes NOT DETECTED NOT DETECTED Final   Acinetobacter baumannii NOT DETECTED NOT DETECTED Final   Enterobacteriaceae species DETECTED (A) NOT DETECTED Final    Comment: CRITICAL RESULT CALLED TO, READ BACK BY AND VERIFIED WITH:  CHRISTINE KATSOUDAS AT 2244  06/04/19 SDR Enterobacteriaceae represent a large family of gram-negative bacteria, not a single organism.    Enterobacter cloacae complex NOT DETECTED NOT DETECTED Final   Escherichia coli DETECTED (A) NOT DETECTED Final    Comment: CRITICAL RESULT CALLED TO, READ BACK BY AND VERIFIED WITH:  CHRISTINE KATSOUDAS AT 9753 06/04/19 SDR    Klebsiella oxytoca NOT DETECTED NOT DETECTED Final   Klebsiella pneumoniae NOT DETECTED NOT DETECTED Final   Proteus species NOT DETECTED NOT DETECTED Final   Serratia marcescens NOT DETECTED NOT DETECTED Final  Carbapenem resistance NOT DETECTED NOT DETECTED Final   Haemophilus influenzae NOT DETECTED NOT DETECTED Final   Neisseria meningitidis NOT DETECTED NOT DETECTED Final   Pseudomonas aeruginosa NOT DETECTED NOT DETECTED Final   Candida albicans NOT DETECTED NOT DETECTED Final   Candida glabrata NOT DETECTED NOT DETECTED Final   Candida krusei NOT DETECTED NOT DETECTED Final   Candida parapsilosis NOT DETECTED NOT DETECTED Final   Candida tropicalis NOT DETECTED NOT DETECTED Final    Comment: Performed at [facility], [address] [place], [place] [address]  Urine Culture     Status: Abnormal (Preliminary result)   Collection Time: 06/03/19  7:48 PM   Specimen: Urine, Random  Result Value Ref Range Status   Specimen Description   Final    URINE, RANDOM Performed at [facility], [address] [place], [place] [address]    Special Requests   Final    NONE Performed at [facility], [address] [place], [place] [address]    Culture >=100,000 COLONIES/mL ESCHERICHIA COLI (A)  Final   Report Status PENDING  Incomplete  Gastrointestinal Panel by PCR , Stool     Status: None   Collection Time: 06/03/19  8:27 PM   Specimen: Stool  Result Value Ref Range Status   Campylobacter species NOT DETECTED NOT DETECTED Final   Plesimonas shigelloides NOT DETECTED NOT DETECTED Final   Salmonella species NOT DETECTED  NOT DETECTED Final   Yersinia enterocolitica NOT DETECTED NOT DETECTED Final   Vibrio species NOT DETECTED NOT DETECTED Final   Vibrio cholerae NOT DETECTED NOT DETECTED Final   Enteroaggregative E coli (EAEC) NOT DETECTED NOT DETECTED Final   Enteropathogenic E coli (EPEC) NOT DETECTED NOT DETECTED Final   Enterotoxigenic E coli (ETEC) NOT DETECTED NOT DETECTED Final   Shiga like toxin producing E coli (STEC) NOT DETECTED NOT DETECTED Final   Shigella/Enteroinvasive E coli (EIEC) NOT DETECTED NOT DETECTED Final   Cryptosporidium NOT DETECTED NOT DETECTED Final   Cyclospora cayetanensis NOT DETECTED NOT DETECTED Final   Entamoeba histolytica NOT DETECTED NOT DETECTED Final   Giardia lamblia NOT DETECTED NOT DETECTED Final   Adenovirus F40/41 NOT DETECTED NOT DETECTED Final   Astrovirus NOT DETECTED NOT DETECTED Final   Norovirus GI/GII NOT DETECTED NOT DETECTED Final   Rotavirus A NOT DETECTED NOT DETECTED Final   Sapovirus (I, II, IV, and V) NOT DETECTED NOT DETECTED Final    Comment: Performed at [facility], [address] [place], [place] [address]  C Difficile Quick Screen w PCR reflex     Status: None   Collection Time: 06/03/19  8:27 PM   Specimen: Stool  Result Value Ref Range Status   C Diff antigen NEGATIVE NEGATIVE Final   C Diff toxin NEGATIVE NEGATIVE Final   C Diff interpretation No C. difficile detected.  Final    Comment: Performed at [facility], [place], [place] [address]  SARS CORONAVIRUS 2 (TAT 6-24 HRS) Nasopharyngeal Nasopharyngeal Swab     Status: None   Collection Time: 06/03/19 10:53 PM   Specimen: Nasopharyngeal Swab  Result Value Ref Range Status   SARS Coronavirus 2 NEGATIVE NEGATIVE Final    Comment: (NOTE) SARS-CoV-2 target nucleic acids are NOT DETECTED. The SARS-CoV-2 RNA is generally detectable in upper and lower respiratory specimens during the acute phase of infection. Negative results do not preclude  SARS-CoV-2 infection, do not rule out co-infections with other pathogens, and should not  be used as the sole basis for treatment or other patient management decisions. Negative results must be combined with clinical observations, patient history, and epidemiological information. The expected result is Negative. Fact Sheet for Patients: SugarRoll.be Fact Sheet for Healthcare Providers: https://www.woods-mathews.com/ This test is not yet approved or cleared by the [place] FDA and  has been authorized for detection and/or diagnosis of SARS-CoV-2 by FDA under an Emergency Use Authorization (EUA). This EUA will remain  in effect (meaning this test can be used) for the duration of the COVID-19 declaration under Section 56 4(b)(1) of the Act, 21 U.S.C. section 360bbb-3(b)(1), unless the authorization is terminated or revoked sooner. Performed at [facility],  [address], [place], [place] [address]       Imaging Studies   ECHOCARDIOGRAM COMPLETE  Result Date: 06/04/2019    ECHOCARDIOGRAM REPORT   Patient Name:   LYNELL GREENHOUSE Date of Exam: 06/04/2019 Medical Rec #:  [id number]      Height:       58.0 in Accession #:    [id number]     Weight:       163.8 lb Date of Birth:  December 05, 1940      BSA:          1.673 m Patient Age:    59 years       BP:           111/55 mmHg Patient Gender: F              HR:           79 bpm. Exam Location:  [facility] Procedure: 2D Echo, Color Doppler and Cardiac Doppler Indications:     R78.81 Bacteremia  History:         Patient has no prior history of Echocardiogram examinations.                  Risk Factors:Hypertension and Dyslipidemia.  Sonographer:     Charmayne Sheer RDCS (AE) Referring Phys:  [id number] Floyce Stakes Sigurd Pugh Diagnosing Phys: Kate Sable MD IMPRESSIONS  1. Left ventricular ejection fraction, by estimation, is 55 to 60%. The left ventricle has normal function. The left ventricle has no regional wall motion  abnormalities. Left ventricular diastolic parameters were normal.  2. Right ventricular systolic function is normal. The right ventricular size is normal. There is moderately elevated pulmonary artery systolic pressure.  3. Left atrial size was mildly dilated.  4. The mitral valve is normal in structure. No evidence of mitral valve regurgitation. No evidence of mitral stenosis.  5. The aortic valve is normal in structure. Aortic valve regurgitation is not visualized. No aortic stenosis is present.  6. The inferior vena cava is dilated in size with >50% respiratory variability, suggesting right atrial pressure of 8 mmHg. FINDINGS  Left Ventricle: Left ventricular ejection fraction, by estimation, is 55 to 60%. The left ventricle has normal function. The left ventricle has no regional wall motion abnormalities. The left ventricular internal cavity size was normal in size. There is  no left ventricular hypertrophy. Left ventricular diastolic parameters were normal. Right Ventricle: The right ventricular size is normal. No increase in right ventricular wall thickness. Right ventricular systolic function is normal. There is moderately elevated pulmonary artery systolic pressure. The tricuspid regurgitant velocity is 2.84 m/s, and with an assumed right atrial pressure of 8 mmHg, the estimated right ventricular systolic pressure is 15.4 mmHg. Left Atrium: Left atrial size was mildly dilated. Right Atrium: Right atrial size  was normal in size. Pericardium: There is no evidence of pericardial effusion. Mitral Valve: The mitral valve is normal in structure. Normal mobility of the mitral valve leaflets. No evidence of mitral valve regurgitation. No evidence of mitral valve stenosis. MV peak gradient, 7.4 mmHg. The mean mitral valve gradient is 3.0 mmHg. Tricuspid Valve: The tricuspid valve is normal in structure. Tricuspid valve regurgitation is not demonstrated. No evidence of tricuspid stenosis. Aortic Valve: The aortic  valve is normal in structure. Aortic valve regurgitation is not visualized. No aortic stenosis is present. Aortic valve mean gradient measures 4.0 mmHg. Aortic valve peak gradient measures 8.0 mmHg. Aortic valve area, by VTI measures 2.51 cm. Pulmonic Valve: The pulmonic valve was not well visualized. Pulmonic valve regurgitation is not visualized. No evidence of pulmonic stenosis. Aorta: The aortic root is normal in size and structure. Venous: The inferior vena cava is dilated in size with greater than 50% respiratory variability, suggesting right atrial pressure of 8 mmHg. IAS/Shunts: No atrial level shunt detected by color flow Doppler.  LEFT VENTRICLE PLAX 2D LVIDd:         4.37 cm  Diastology LVIDs:         3.32 cm  LV e' lateral:   8.59 cm/s LV PW:         1.02 cm  LV E/e' lateral: 12.0 LV IVS:        0.95 cm  LV e' medial:    8.16 cm/s LVOT diam:     2.00 cm  LV E/e' medial:  12.6 LV SV:         67 LV SV Index:   40 LVOT Area:     3.14 cm  RIGHT VENTRICLE RV Basal diam:  2.99 cm LEFT ATRIUM             Index       RIGHT ATRIUM           Index LA diam:        4.60 cm 2.75 cm/m  RA Area:     11.80 cm LA Vol (A2C):   54.2 ml 32.40 ml/m RA Volume:   28.20 ml  16.86 ml/m LA Vol (A4C):   55.1 ml 32.94 ml/m LA Biplane Vol: 55.5 ml 33.18 ml/m  AORTIC VALVE                   PULMONIC VALVE AV Area (Vmax):    2.45 cm    PV Vmax:       1.09 m/s AV Area (Vmean):   2.63 cm    PV Vmean:      73.700 cm/s AV Area (VTI):     2.51 cm    PV VTI:        0.206 m AV Vmax:           141.00 cm/s PV Peak grad:  4.8 mmHg AV Vmean:          94.700 cm/s PV Mean grad:  3.0 mmHg AV VTI:            0.265 m AV Peak Grad:      8.0 mmHg AV Mean Grad:      4.0 mmHg LVOT Vmax:         110.00 cm/s LVOT Vmean:        79.300 cm/s LVOT VTI:          0.212 m LVOT/AV VTI ratio: 0.80  AORTA Ao Root diam: 3.00 cm MITRAL VALVE  TRICUSPID VALVE MV Area (PHT): 3.99 cm     TR Peak grad:   32.3 mmHg MV Peak grad:  7.4 mmHg     TR  Vmax:        284.00 cm/s MV Mean grad:  3.0 mmHg MV Vmax:       1.36 m/s     SHUNTS MV Vmean:      80.7 cm/s    Systemic VTI:  0.21 m MV Decel Time: 190 msec     Systemic Diam: 2.00 cm MV E velocity: 103.00 cm/s MV A velocity: 93.60 cm/s MV E/A ratio:  1.10 Kate Sable MD Electronically signed by Kate Sable MD Signature Date/Time: 06/04/2019/3:28:02 PM    Final      Medications   Scheduled Meds: . budesonide  2 mL Inhalation BID  . Chlorhexidine Gluconate Cloth  6 each Topical Daily  . enoxaparin (LOVENOX) injection  40 mg Subcutaneous Q24H  . rOPINIRole  0.5 mg Oral BID   And  . rOPINIRole  1.5 mg Oral QHS  . simvastatin  10 mg Oral QHS  . sodium chloride flush  3 mL Intravenous Q12H   Continuous Infusions: . meropenem (MERREM) IV 1 g (06/06/19 0536)       LOS: 2 days    Time spent: 35 minutes    Ezekiel Slocumb, DO [facility]   If 7PM-7AM, please contact night-coverage [email or web address] 06/06/2019, 7:26 AM

## 2019-06-06 NOTE — Consult Note (Signed)
PHARMACY CONSULT NOTE - FOLLOW UP  Pharmacy Consult for Electrolyte Monitoring and Replacement   Recent Labs: Potassium (mmol/L)  Date Value  06/06/2019 4.3   Magnesium (mg/dL)  Date Value  06/06/2019 2.5 (H)   Calcium (mg/dL)  Date Value  06/06/2019 7.6 (L)   Albumin (g/dL)  Date Value  06/03/2019 3.3 (L)   Phosphorus (mg/dL)  Date Value  06/06/2019 2.1 (L)   Sodium (mmol/L)  Date Value  06/06/2019 134 (L)   Corrected Ca  9.12 mg/dL  Assessment: 79 y.o. female with medical history significant for asthma, GERD, small fiber neuropathy, restless leg syndrome, and OSA on CPAP who presents to the ED for evaluation of fevers and diarrhea. Patient with E.coli UTI and bacteremia.  MD ordered LR w/ 40 K to run at 75 ml/hr. Additionally, on 4/8 she received 20 mmol IV potassium phosphate with no improvement in serum phosphate.  Goal of Therapy:  Electrolytes WNL  Plan:   sodium phosphate 20 mmol IV x 1  (this provides 40 mEq Na)  LR w/ 40 has been stopped  follow up all electrolytes with morning labs.  Dallie Piles ,PharmD Clinical Pharmacist 06/06/2019 7:22 AM

## 2019-06-06 NOTE — Care Management Important Message (Signed)
Important Message  Patient Details  Name: Erin Petersen MRN: [id number] Date of Birth: [birth year]   Medicare Important Message Given:  Yes  Initial Medicare IM given by Patient Access Associate on 06/05/2019 at 10:56am.   Dannette Jawanna 06/06/2019, 8:13 AM

## 2019-06-07 LAB — BASIC METABOLIC PANEL
Anion gap: 9 (ref 5–15)
BUN: 6 mg/dL — ABNORMAL LOW (ref 8–23)
CO2: 26 mmol/L (ref 22–32)
Calcium: 7.6 mg/dL — ABNORMAL LOW (ref 8.9–10.3)
Chloride: 99 mmol/L (ref 98–111)
Creatinine, Ser: 0.64 mg/dL (ref 0.44–1.00)
GFR calc Af Amer: >60 mL/min (ref >60)
GFR calc non Af Amer: >60 mL/min (ref >60)
Glucose, Bld: 107 mg/dL — ABNORMAL HIGH (ref 70–99)
Potassium: 3.7 mmol/L (ref 3.5–5.1)
Sodium: 134 mmol/L — ABNORMAL LOW (ref 135–145)

## 2019-06-07 LAB — CBC
HCT: 36.3 % (ref 36.0–46.0)
Hemoglobin: 11.9 g/dL — ABNORMAL LOW (ref 12.0–15.0)
MCH: 27.6 pg (ref 26.0–34.0)
MCHC: 32.8 g/dL (ref 30.0–36.0)
MCV: 84.2 fL (ref 80.0–100.0)
Platelets: 417 10*3/uL — ABNORMAL HIGH (ref 150–400)
RBC: 4.31 MIL/uL (ref 3.87–5.11)
RDW: 14.4 % (ref 11.5–15.5)
WBC: 11.2 10*3/uL — ABNORMAL HIGH (ref 4.0–10.5)
nRBC: 0 % (ref 0.0–0.2)

## 2019-06-07 LAB — MAGNESIUM: Magnesium: 2.3 mg/dL (ref 1.7–2.4)

## 2019-06-07 LAB — PHOSPHORUS: Phosphorus: 2.8 mg/dL (ref 2.5–4.6)

## 2019-06-07 MED ORDER — CEPHALEXIN 500 MG PO CAPS: 500.0000 mg | ORAL_CAPSULE | Freq: Four times a day (QID) | ORAL | 0 refills | Status: AC

## 2019-06-07 MED ORDER — POLYETHYLENE GLYCOL 3350 17 G PO PACK
17.0000 g | PACK | Freq: Every day | ORAL | Status: DC
  Administered 2019-06-07: 17 g via ORAL
  Filled 2019-06-07: qty 1

## 2019-06-07 MED ORDER — POTASSIUM CHLORIDE CRYS ER 20 MEQ PO TBCR
20.0000 meq | EXTENDED_RELEASE_TABLET | Freq: Once | ORAL | Status: AC
  Administered 2019-06-07: 09:00:00 20 meq via ORAL
  Filled 2019-06-07: qty 1

## 2019-06-07 MED ORDER — ONDANSETRON HCL 4 MG PO TABS: 4.0000 mg | ORAL_TABLET | Freq: Three times a day (TID) | ORAL | 0 refills | Status: DC | PRN

## 2019-06-07 NOTE — Progress Notes (Signed)
Physical Therapy Evaluation Patient Details Name: Tonisha Silvey MRN: [id number] DOB: 02-May-1940 Today's Date: 06/07/2019   History of Present Illness  Per MD note:Erin Petersen is a 79 y.o. female with medical history significant for asthma, GERD, small fiber neuropathy, restless leg syndrome, and OSA on CPAP who presents to the ED for evaluation of fevers and diarrhea.    Clinical Impression  Patient agrees to PT evaluation. She has no strength deficits BLE, sitting balance and standing balance is WNL. She is independent with bed mobility and transfers without AD. She is able to ambulate 300 feet without AD. She has no skilled PT needs at this time and will be DC from PT .     Follow Up Recommendations No PT follow up    Equipment Recommendations  None recommended by PT    Recommendations for Other Services       Precautions / Restrictions Precautions Precautions: Fall Restrictions Weight Bearing Restrictions: No      Mobility  Bed Mobility Overal bed mobility: Independent                Transfers Overall transfer level: Independent Equipment used: None                Ambulation/Gait Ambulation/Gait assistance: Nurse, learning disability (Feet): 300 Feet Assistive device: None Gait Pattern/deviations: Step-through pattern        Stairs            Wheelchair Mobility    Modified Rankin (Stroke Patients Only)       Balance Overall balance assessment: Independent                                           Pertinent Vitals/Pain Pain Assessment: No/denies pain    Home Living Family/patient expects to be discharged to:: Private residence Living Arrangements: Alone     Home Access: Stairs to enter Entrance Stairs-Rails: Right Entrance Stairs-Number of Steps: 1 Home Layout: One level        Prior Function Level of Independence: Independent               Hand Dominance   Dominant Hand: Right     Extremity/Trunk Assessment   Upper Extremity Assessment Upper Extremity Assessment: Overall WFL for tasks assessed    Lower Extremity Assessment Lower Extremity Assessment: Overall WFL for tasks assessed       Communication   Communication: No difficulties  Cognition Arousal/Alertness: Awake/alert Behavior During Therapy: WFL for tasks assessed/performed Overall Cognitive Status: Within Functional Limits for tasks assessed                                        General Comments      Exercises     Assessment/Plan    PT Assessment Patent does not need any further PT services  PT Problem List         PT Treatment Interventions      PT Goals (Current goals can be found in the Care Plan section)  Acute Rehab PT Goals Patient Stated Goal: no goals stated PT Goal Formulation: All assessment and education complete, DC therapy    Frequency     Barriers to discharge        Co-evaluation  AM-PAC PT "6 Clicks" Mobility  Outcome Measure Help needed turning from your back to your side while in a flat bed without using bedrails?: None Help needed moving from lying on your back to sitting on the side of a flat bed without using bedrails?: None Help needed moving to and from a bed to a chair (including a wheelchair)?: None Help needed standing up from a chair using your arms (e.g., wheelchair or bedside chair)?: None Help needed to walk in hospital room?: None Help needed climbing 3-5 steps with a railing? : None 6 Click Score: 24    End of Session Equipment Utilized During Treatment: Gait belt Activity Tolerance: Patient tolerated treatment well Patient left: in bed;with bed alarm set Nurse Communication: Mobility status PT Visit Diagnosis: Difficulty in walking, not elsewhere classified (R26.2)    Time: 1245-1305 PT Time Calculation (min) (ACUTE ONLY): 20 min   Charges:   PT Evaluation $PT Eval Low Complexity: 1 Low             Bransford, Sherryl Barters, PT DPT 06/07/2019, 1:45 PM

## 2019-06-07 NOTE — Consult Note (Signed)
PHARMACY CONSULT NOTE - FOLLOW UP  Pharmacy Consult for Electrolyte Monitoring and Replacement   Recent Labs: Potassium (mmol/L)  Date Value  06/07/2019 3.7   Magnesium (mg/dL)  Date Value  06/07/2019 2.3   Calcium (mg/dL)  Date Value  06/07/2019 7.6 (L)   Albumin (g/dL)  Date Value  06/03/2019 3.3 (L)   Phosphorus (mg/dL)  Date Value  06/07/2019 2.8   Sodium (mmol/L)  Date Value  06/07/2019 134 (L)   Corrected Ca  9.12 mg/dL  Assessment: 79 y.o. female with medical history significant for asthma, GERD, small fiber neuropathy, restless leg syndrome, and OSA on CPAP who presents to the ED for evaluation of fevers and diarrhea. Patient with E.coli UTI and bacteremia.  MD ordered LR w/ 40 K to run at 75 ml/hr. Additionally, on 4/8 she received 20 mmol IV potassium phosphate with no improvement in serum phosphate.  Goal of Therapy:  Electrolytes WNL  Plan:   Will give 66mq KCl po  follow up all electrolytes with morning labs.  CLu Duffel PharmD, BCPS Clinical Pharmacist 06/07/2019 7:11 AM

## 2019-06-07 NOTE — Discharge Summary (Signed)
Physician Discharge Summary  Erin Petersen VQX:[id number] DOB: 1940/08/01 DOA: 06/03/2019  PCP: Elby Beck, FNP  Admit date: 06/03/2019 Discharge date: 06/07/2019  Admitted From: home Disposition:  home  Recommendations for Outpatient Follow-up:  1. Follow up with PCP in 1-2 weeks 2. Please obtain BMP/CBC in one week 3. Please follow up on patient's blood pressure medications.  Her HCTZ was held during admission, and at time of discharge, because patient was requiring potassium replacement during her stay.  Please recheck her potassium and renal function, and resume HCTZ if needed.  Patient's BP remained fairly well controlled with just 5 mg amlodipine during admission.  She was advised to check BP at home, and increase amlodipine to 10 mg daily if BP staying elevated.  Home Health: No  Equipment/Devices: None   Discharge Condition: Stable  CODE STATUS: Full  Diet recommendation: soft bland foods, advance as tolerated with nausea improving  Brief/Interim Summary:  Erin Petersen a 79 y.o.femalewith medical history significant for interstitial cystitis, asthma, GERD, small fiber neuropathy, restless leg syndrome, and OSA on CPAP who presented to the ED on 4/6 for evaluation of fevers and diarrhea x 2 days.  Associated symptoms of intermittent fevers with rigors, nausea without emesis, abdominal cramping, fatigue, generalized weakness, body aches and lightheadedness. In the ED, borderline BP 103/56, HR 92, RR 20, temp 100.7 (later up to 103).  Labs with marked electrolytes abnormalities, WBC 10.7k, normal lipase, normal lactic acid.  Covid-19 negative.  UA consistent with infection, sent for cultures.  C diff and GI pathogen panel negative.  CXR negative.  CT abdomen/pelvis showed R pyelonephritis.  Patient's initial blood culture returned positive for GNR's.  Antibiotics changed to meropenem pending identification and susceptibilities.  Treated with IV fluids, potassium and IV  antibiotics.  Admitted to hospitalist service.   Gastroenteritis, Nausea/Vomiting, Diarrhea Patient presented with persistent watery, nonbloody diarrhea.  C. difficile and GI panel negative.  COVID-19 negative as well.  Suspect this is a viral gastroenteritis versus secondary to bacteremia and pyelonephritis.  Diarrhea improved with Imodium.  Continues to have intermittent nausea. --Imodium as needed for diarrhea --Soft diet --IV fluids: stopped --antiemetics PRN  Bacteremia due to Gram-negative bacteria Complicated UTI with Pyelonephritis of right kidney Patient presented with suprapubic pain, UA consistent with UTI, CT abdomen pelvis with changes consistent with right-sided pyelonephritis.  Patient was initially treated with Rocephin based on prior cultures showing pansensitive E. coli.  Initial blood culture subsequently grew gram-negative rods.  Antibiotics switched to meropenem pending identification and susceptibility.  TTE negative for vegetations.  Repeat cultures no growth to date.   --de-escalated IV antibiotics to Ancef based on sensitivities --Keflex on d/c to complete 7 day course  Generalized Weakness - evaluated by PT and no follow needs recommended. Improved with treatment of infection and improved diarrhea.  Hypophosphatemia - improved Hypokalemia - resolved Hypocalcemia --Monitor daily and replete as needed --maintain K>4.0 --pharmacy consulted for electrolyte replacement  Hyponatremia -likely hypovolemic in the setting of diarrhea.  Improved with IV hydration.  Follow up BMP outpatient to recheck.  Benign essential hypertension -chronic, stable, currently borderline low secondary to hypovolemia.  Home regimen is Norvasc 5 mg, HCTZ 25 mg. HCTZ held given hyponatremia, discontinued on d/c to prevent hypotension as patient BP controlled on Norvasc alone during admission.  PCP to follow up.  OSA on CPAP - continue CPAP qHS  Hyperlipidemia - continue home  statin  Restless legs syndrome - continue Requip    Small fiber neuropathy -  continue Lyrica   Asthma -not acutely exacerbated.  Continue Flovent.  Obesity: Body mass index is 34.23 kg/m.    Discharge Diagnoses: Principal Problem:   Gastroenteritis Active Problems:   Pyelonephritis of right kidney   Hypokalemia   Hyponatremia   Bacteremia due to Gram-negative bacteria   Benign essential hypertension   OSA on CPAP   High cholesterol   Restless legs syndrome   Small fiber neuropathy   Asthma    Discharge Instructions    Allergies as of 06/07/2019      Reactions   Other Anaphylaxis   Bee Sting    Ciprofloxacin Hcl Rash   Mild rash after long term use   Sulfa Antibiotics Rash   Bactrim Bactrim, Cipro      Medication List    STOP taking these medications   hydrochlorothiazide 25 MG tablet Commonly known as: HYDRODIURIL     TAKE these medications   acetaminophen 650 MG CR tablet Commonly known as: TYLENOL Take 1,300 mg by mouth at bedtime.   albuterol 108 (90 Base) MCG/ACT inhaler Commonly known as: VENTOLIN HFA Inhale 2 puffs into the lungs every 6 (six) hours as needed for wheezing or shortness of breath.   alendronate 70 MG tablet Commonly known as: FOSAMAX Take 70 mg by mouth once a week. Take with a full glass of water on an empty stomach.   amLODipine 5 MG tablet Commonly known as: NORVASC Take 1 tablet (5 mg total) by mouth daily.   aspirin 81 MG EC tablet Take 81 mg by mouth at bedtime.   cephALEXin 500 MG capsule Commonly known as: KEFLEX Take 1 capsule (500 mg total) by mouth 4 (four) times daily for 4 days.   DIAZEPAM RE Place 10 mg rectally at bedtime.   diclofenac sodium 1 % Gel Commonly known as: VOLTAREN Apply 1 application topically 2 (two) times daily as needed (pain.).   dicyclomine 10 MG capsule Commonly known as: BENTYL TAKE 1 CAPSULE BY MOUTH AT  BEDTIME   Elmiron 100 MG capsule Generic drug: pentosan  polysulfate Take 100 mg by mouth 3 (three) times daily. With meals   EPINEPHrine 0.3 mg/0.3 mL Soaj injection Commonly known as: EPI-PEN Inject 0.3 mLs (0.3 mg total) into the muscle as needed for anaphylaxis.   estradiol 0.1 MG/GM vaginal cream Commonly known as: ESTRACE Place 1 Applicatorful vaginally 3 (three) times a week. What changed: when to take this   fluticasone 44 MCG/ACT inhaler Commonly known as: FLOVENT HFA Inhale 2 puffs into the lungs 2 (two) times daily.   fluticasone 50 MCG/ACT nasal spray Commonly known as: FLONASE Place 2 sprays into both nostrils daily. What changed: how much to take   hydrOXYzine 25 MG tablet Commonly known as: ATARAX/VISTARIL 1po  one hour before sleep   lidocaine 2 % jelly Commonly known as: XYLOCAINE Apply 1 application topically as needed (pain.).   lidocaine 5 % ointment Commonly known as: XYLOCAINE   linaclotide 72 MCG capsule Commonly known as: LINZESS Take 1 capsule (72 mcg total) by mouth daily before breakfast.   omeprazole 40 MG capsule Commonly known as: PRILOSEC Take 1 capsule (40 mg total) by mouth 2 (two) times daily before a meal.   ondansetron 4 MG tablet Commonly known as: Zofran Take 1 tablet (4 mg total) by mouth every 8 (eight) hours as needed for up to 7 days for nausea or vomiting.   polyethylene glycol 17 g packet Commonly known as: MIRALAX / GLYCOLAX Take 1 packet  by mouth daily.   pregabalin 150 MG capsule Commonly known as: LYRICA Take 1 capsule (150 mg total) by mouth 2 (two) times daily.   rOPINIRole 0.5 MG tablet Commonly known as: REQUIP Take 1-3 tablets (0.5-1.5 mg total) by mouth See admin instructions. Take 1 tablet (0.5 mg) by mouth in the morning, 1 tablet (0.5 mg) by mouth in the afternoon, & take 3 tablets (1.5 mg) by mouth at bedtime.   simvastatin 20 MG tablet Commonly known as: ZOCOR Take 0.5 tablets (10 mg total) by mouth at bedtime. What changed: how much to take   traMADol  50 MG tablet Commonly known as: ULTRAM Take 50 mg by mouth every 6 (six) hours as needed.   Vitamin B-12 3000 MCG Subl Take 3,000 mcg by mouth daily.   Vitamin D3 50 MCG (2000 UT) Tabs Take 2,000 Units by mouth daily.       Allergies  Allergen Reactions  . Other Anaphylaxis    Bee Sting   . Ciprofloxacin Hcl Rash    Mild rash after long term use  . Sulfa Antibiotics Rash    Bactrim Bactrim, Cipro     Consultations:  PHarmacy    Procedures/Studies: CT ABDOMEN PELVIS W CONTRAST  Result Date: 06/03/2019 CLINICAL DATA:  Left-sided abdominal pain. Fever. Diarrhea. EXAM: CT ABDOMEN AND PELVIS WITH CONTRAST TECHNIQUE: Multidetector CT imaging of the abdomen and pelvis was performed using the standard protocol following bolus administration of intravenous contrast. CONTRAST:  151m OMNIPAQUE IOHEXOL 300 MG/ML  SOLN COMPARISON:  None. FINDINGS: Lower chest: Normal. Hepatobiliary: No focal liver abnormality is seen. No gallstones, gallbladder wall thickening, or biliary dilatation. Pancreas: Unremarkable. No pancreatic ductal dilatation or surrounding inflammatory changes. Spleen: Normal in size without focal abnormality. Adrenals/Urinary Tract: There is perinephric soft tissue stranding adjacent to a slightly complex cyst on the lower pole right kidney with slight abnormal perfusion to the adjacent renal parenchyma. This lesion is approximately 3.2 cm in size. There is slight enhancement of the mucosa of the renal pelvis and the right ureter is slightly prominent with enhancement of the mucosa. The left kidney appears normal. Bladder is normal. Adrenal glands are normal. Stomach/Bowel: Stomach is within normal limits. Appendix has been removed. No evidence of bowel wall thickening, distention, or inflammatory changes. Vascular/Lymphatic: Aortic atherosclerosis. No enlarged abdominal or pelvic lymph nodes. Reproductive: Status post hysterectomy. No adnexal masses. Other: Small amount of fluid  in the pelvic cul-de-sac. No free air. No abdominal hernia. Musculoskeletal: Old compression fracture of L1. Previous lumbar fusion at L4-5 and L5-S1. Severe degenerative disc disease at L2-3 and L3-4. No acute bone abnormality. IMPRESSION: 1. Slightly complex cyst on the lower pole of the right kidney with adjacent perinephric soft tissue stranding and enhancement of the mucosa of the right renal pelvis and right ureter consistent with pyelonephritis. 2. No other significant abnormality. 3. Aortic atherosclerosis. 4. Old compression fracture of L1. Previous lumbar fusion at L4-5 and L5-S1. 5. Small amount of nonspecific fluid in the pelvic cul-de-sac. Aortic Atherosclerosis (ICD10-I70.0). Electronically Signed   By: JLorriane ShireM.D.   On: 06/03/2019 21:50   DG Chest Port 1 View  Result Date: 06/03/2019 CLINICAL DATA:  Fever. EXAM: PORTABLE CHEST 1 VIEW COMPARISON:  None. FINDINGS: Very mildly increased lung markings are seen with a trace amount of atelectasis noted within the left lung base. There is no evidence of a pleural effusion or pneumothorax. The heart size and mediastinal contours are within normal limits. There is marked severity  calcification of the aortic arch. The visualized skeletal structures are unremarkable. IMPRESSION: No active disease. Electronically Signed   By: Virgina Norfolk M.D.   On: 06/03/2019 20:08   ECHOCARDIOGRAM COMPLETE  Result Date: 06/04/2019    ECHOCARDIOGRAM REPORT   Patient Name:   Erin Petersen Date of Exam: 06/04/2019 Medical Rec #:  [id number]      Height:       58.0 in Accession #:    [id number]     Weight:       163.8 lb Date of Birth:  20-Mar-1940      BSA:          1.673 m Patient Age:    79 years       BP:           111/55 mmHg Patient Gender: F              HR:           79 bpm. Exam Location:  [facility] Procedure: 2D Echo, Color Doppler and Cardiac Doppler Indications:     R78.81 Bacteremia  History:         Patient has no prior history of Echocardiogram  examinations.                  Risk Factors:Hypertension and Dyslipidemia.  Sonographer:     Charmayne Sheer RDCS (AE) Referring Phys:  [id number] Floyce Stakes Copelyn Widmer Diagnosing Phys: Kate Sable MD IMPRESSIONS  1. Left ventricular ejection fraction, by estimation, is 55 to 60%. The left ventricle has normal function. The left ventricle has no regional wall motion abnormalities. Left ventricular diastolic parameters were normal.  2. Right ventricular systolic function is normal. The right ventricular size is normal. There is moderately elevated pulmonary artery systolic pressure.  3. Left atrial size was mildly dilated.  4. The mitral valve is normal in structure. No evidence of mitral valve regurgitation. No evidence of mitral stenosis.  5. The aortic valve is normal in structure. Aortic valve regurgitation is not visualized. No aortic stenosis is present.  6. The inferior vena cava is dilated in size with >50% respiratory variability, suggesting right atrial pressure of 8 mmHg. FINDINGS  Left Ventricle: Left ventricular ejection fraction, by estimation, is 55 to 60%. The left ventricle has normal function. The left ventricle has no regional wall motion abnormalities. The left ventricular internal cavity size was normal in size. There is  no left ventricular hypertrophy. Left ventricular diastolic parameters were normal. Right Ventricle: The right ventricular size is normal. No increase in right ventricular wall thickness. Right ventricular systolic function is normal. There is moderately elevated pulmonary artery systolic pressure. The tricuspid regurgitant velocity is 2.84 m/s, and with an assumed right atrial pressure of 8 mmHg, the estimated right ventricular systolic pressure is 40.1 mmHg. Left Atrium: Left atrial size was mildly dilated. Right Atrium: Right atrial size was normal in size. Pericardium: There is no evidence of pericardial effusion. Mitral Valve: The mitral valve is normal in structure. Normal  mobility of the mitral valve leaflets. No evidence of mitral valve regurgitation. No evidence of mitral valve stenosis. MV peak gradient, 7.4 mmHg. The mean mitral valve gradient is 3.0 mmHg. Tricuspid Valve: The tricuspid valve is normal in structure. Tricuspid valve regurgitation is not demonstrated. No evidence of tricuspid stenosis. Aortic Valve: The aortic valve is normal in structure. Aortic valve regurgitation is not visualized. No aortic stenosis is present. Aortic valve mean gradient measures 4.0 mmHg. Aortic valve peak gradient  measures 8.0 mmHg. Aortic valve area, by VTI measures 2.51 cm. Pulmonic Valve: The pulmonic valve was not well visualized. Pulmonic valve regurgitation is not visualized. No evidence of pulmonic stenosis. Aorta: The aortic root is normal in size and structure. Venous: The inferior vena cava is dilated in size with greater than 50% respiratory variability, suggesting right atrial pressure of 8 mmHg. IAS/Shunts: No atrial level shunt detected by color flow Doppler.  LEFT VENTRICLE PLAX 2D LVIDd:         4.37 cm  Diastology LVIDs:         3.32 cm  LV e' lateral:   8.59 cm/s LV PW:         1.02 cm  LV E/e' lateral: 12.0 LV IVS:        0.95 cm  LV e' medial:    8.16 cm/s LVOT diam:     2.00 cm  LV E/e' medial:  12.6 LV SV:         67 LV SV Index:   40 LVOT Area:     3.14 cm  RIGHT VENTRICLE RV Basal diam:  2.99 cm LEFT ATRIUM             Index       RIGHT ATRIUM           Index LA diam:        4.60 cm 2.75 cm/m  RA Area:     11.80 cm LA Vol (A2C):   54.2 ml 32.40 ml/m RA Volume:   28.20 ml  16.86 ml/m LA Vol (A4C):   55.1 ml 32.94 ml/m LA Biplane Vol: 55.5 ml 33.18 ml/m  AORTIC VALVE                   PULMONIC VALVE AV Area (Vmax):    2.45 cm    PV Vmax:       1.09 m/s AV Area (Vmean):   2.63 cm    PV Vmean:      73.700 cm/s AV Area (VTI):     2.51 cm    PV VTI:        0.206 m AV Vmax:           141.00 cm/s PV Peak grad:  4.8 mmHg AV Vmean:          94.700 cm/s PV Mean grad:   3.0 mmHg AV VTI:            0.265 m AV Peak Grad:      8.0 mmHg AV Mean Grad:      4.0 mmHg LVOT Vmax:         110.00 cm/s LVOT Vmean:        79.300 cm/s LVOT VTI:          0.212 m LVOT/AV VTI ratio: 0.80  AORTA Ao Root diam: 3.00 cm MITRAL VALVE                TRICUSPID VALVE MV Area (PHT): 3.99 cm     TR Peak grad:   32.3 mmHg MV Peak grad:  7.4 mmHg     TR Vmax:        284.00 cm/s MV Mean grad:  3.0 mmHg MV Vmax:       1.36 m/s     SHUNTS MV Vmean:      80.7 cm/s    Systemic VTI:  0.21 m MV Decel Time: 190 msec     Systemic Diam: 2.00 cm  MV E velocity: 103.00 cm/s MV A velocity: 93.60 cm/s MV E/A ratio:  1.10 Kate Sable MD Electronically signed by Kate Sable MD Signature Date/Time: 06/04/2019/3:28:02 PM    Final        Subjective: Patient reports still some intermittent nausea but better tolerating PO intake now.  No fever chills.  No abdominal pain.  No diarrhea.  States ready to get home.   Discharge Exam: Vitals:   06/07/19 0857 06/07/19 1221  BP: 136/62 (!) 130/59  Pulse: 76 71  Resp: 20   Temp: 99.3 F (37.4 C) 98.4 F (36.9 C)  SpO2: 96% 98%   Vitals:   06/06/19 2011 06/07/19 0558 06/07/19 0857 06/07/19 1221  BP: 139/67 139/68 136/62 (!) 130/59  Pulse: 71 67 76 71  Resp: 16 20 20    Temp: 98.8 F (37.1 C) 98.8 F (37.1 C) 99.3 F (37.4 C) 98.4 F (36.9 C)  TempSrc: Oral Oral Oral Oral  SpO2: 99% 99% 96% 98%  Weight:      Height:        General: Pt is alert, awake, not in acute distress, obese Cardiovascular: RRR, S1/S2 +, no rubs, no gallops Respiratory: CTA bilaterally, no wheezing, no rhonchi Abdominal: Soft, NT, ND, bowel sounds + Extremities: no edema, no cyanosis    The results of significant diagnostics from this hospitalization (including imaging, microbiology, ancillary and laboratory) are listed below for reference.     Microbiology: Recent Results (from the past 240 hour(s))  Blood Culture (routine x 2)     Status: Abnormal   Collection  Time: 06/03/19  7:26 PM   Specimen: BLOOD  Result Value Ref Range Status   Specimen Description   Final    BLOOD LEFT ANTECUBITAL Performed at [facility], [address], [place], [place] [address]    Special Requests   Final    BOTTLES DRAWN AEROBIC AND ANAEROBIC Blood Culture adequate volume Performed at [facility], [address] [place], [place] [address]    Culture  Setup Time   Final    GRAM NEGATIVE RODS AEROBIC BOTTLE ONLY CRITICAL RESULT CALLED TO, READ BACK BY AND VERIFIED WITH: CHRISTINE KATSOUDAS AT 5681 06/04/19 SDR Performed at [facility], [place] [address] [place], [place] [address]    Culture ESCHERICHIA COLI (A)  Final   Report Status 06/06/2019 FINAL  Final   Organism ID, Bacteria ESCHERICHIA COLI  Final      Susceptibility   Escherichia coli - MIC*    AMPICILLIN <=2 SENSITIVE Sensitive     CEFAZOLIN <=4 SENSITIVE Sensitive     CEFEPIME <=0.12 SENSITIVE Sensitive     CEFTAZIDIME <=1 SENSITIVE Sensitive     CEFTRIAXONE <=0.25 SENSITIVE Sensitive     CIPROFLOXACIN <=0.25 SENSITIVE Sensitive     GENTAMICIN <=1 SENSITIVE Sensitive     IMIPENEM <=0.25 SENSITIVE Sensitive     TRIMETH/SULFA <=20 SENSITIVE Sensitive     AMPICILLIN/SULBACTAM <=2 SENSITIVE Sensitive     PIP/TAZO <=4 SENSITIVE Sensitive     * ESCHERICHIA COLI  Blood Culture (routine x 2)     Status: None (Preliminary result)   Collection Time: 06/03/19  7:26 PM   Specimen: BLOOD  Result Value Ref Range Status   Specimen Description BLOOD RIGHT ANTECUBITAL  Final   Special Requests   Final    BOTTLES DRAWN AEROBIC AND ANAEROBIC Blood Culture adequate volume   Culture   Final    NO GROWTH 4 DAYS Performed at [facility], [address] [place], [place] [address]    Report Status PENDING  Incomplete  Blood Culture ID Panel (Reflexed)     Status: Abnormal   Collection Time: 06/03/19  7:26 PM  Result Value Ref Range Status   Enterococcus species NOT DETECTED  NOT DETECTED Final   Vancomycin resistance NOT DETECTED NOT DETECTED Final   Listeria monocytogenes NOT DETECTED NOT DETECTED Final   Staphylococcus species NOT DETECTED NOT DETECTED Final   Staphylococcus aureus (BCID) NOT DETECTED NOT DETECTED Final   Methicillin resistance NOT DETECTED NOT DETECTED Final   Streptococcus species NOT DETECTED NOT DETECTED Final   Streptococcus agalactiae NOT DETECTED NOT DETECTED Final   Streptococcus pneumoniae NOT DETECTED NOT DETECTED Final   Streptococcus pyogenes NOT DETECTED NOT DETECTED Final   Acinetobacter baumannii NOT DETECTED NOT DETECTED Final   Enterobacteriaceae species DETECTED (A) NOT DETECTED Final    Comment: CRITICAL RESULT CALLED TO, READ BACK BY AND VERIFIED WITH:  CHRISTINE KATSOUDAS AT 3546 06/04/19 SDR Enterobacteriaceae represent a large family of gram-negative bacteria, not a single organism.    Enterobacter cloacae complex NOT DETECTED NOT DETECTED Final   Escherichia coli DETECTED (A) NOT DETECTED Final    Comment: CRITICAL RESULT CALLED TO, READ BACK BY AND VERIFIED WITH:  CHRISTINE KATSOUDAS AT 5681 06/04/19 SDR    Klebsiella oxytoca NOT DETECTED NOT DETECTED Final   Klebsiella pneumoniae NOT DETECTED NOT DETECTED Final   Proteus species NOT DETECTED NOT DETECTED Final   Serratia marcescens NOT DETECTED NOT DETECTED Final   Carbapenem resistance NOT DETECTED NOT DETECTED Final   Haemophilus influenzae NOT DETECTED NOT DETECTED Final   Neisseria meningitidis NOT DETECTED NOT DETECTED Final   Pseudomonas aeruginosa NOT DETECTED NOT DETECTED Final   Candida albicans NOT DETECTED NOT DETECTED Final   Candida glabrata NOT DETECTED NOT DETECTED Final   Candida krusei NOT DETECTED NOT DETECTED Final   Candida parapsilosis NOT DETECTED NOT DETECTED Final   Candida tropicalis NOT DETECTED NOT DETECTED Final    Comment: Performed at [facility], [address], [place], [place] [address]  Urine Culture     Status:  Abnormal   Collection Time: 06/03/19  7:48 PM   Specimen: Urine, Random  Result Value Ref Range Status   Specimen Description   Final    URINE, RANDOM Performed at [facility], [address] [place], [place] [address]    Special Requests   Final    NONE Performed at [facility], [address] [place], [place] [address]    Culture >=100,000 COLONIES/mL ESCHERICHIA COLI (A)  Final   Report Status 06/06/2019 FINAL  Final   Organism ID, Bacteria ESCHERICHIA COLI (A)  Final      Susceptibility   Escherichia coli - MIC*    AMPICILLIN <=2 SENSITIVE Sensitive     CEFAZOLIN <=4 SENSITIVE Sensitive     CEFTRIAXONE <=0.25 SENSITIVE Sensitive     CIPROFLOXACIN <=0.25 SENSITIVE Sensitive     GENTAMICIN <=1 SENSITIVE Sensitive     IMIPENEM <=0.25 SENSITIVE Sensitive     NITROFURANTOIN <=16 SENSITIVE Sensitive     TRIMETH/SULFA <=20 SENSITIVE Sensitive     AMPICILLIN/SULBACTAM <=2 SENSITIVE Sensitive     PIP/TAZO <=4 SENSITIVE Sensitive     * >=100,000 COLONIES/mL ESCHERICHIA COLI  Gastrointestinal Panel by PCR , Stool     Status: None   Collection Time: 06/03/19  8:27 PM   Specimen: Stool  Result Value Ref Range Status   Campylobacter  species NOT DETECTED NOT DETECTED Final   Plesimonas shigelloides NOT DETECTED NOT DETECTED Final   Salmonella species NOT DETECTED NOT DETECTED Final   Yersinia enterocolitica NOT DETECTED NOT DETECTED Final   Vibrio species NOT DETECTED NOT DETECTED Final   Vibrio cholerae NOT DETECTED NOT DETECTED Final   Enteroaggregative E coli (EAEC) NOT DETECTED NOT DETECTED Final   Enteropathogenic E coli (EPEC) NOT DETECTED NOT DETECTED Final   Enterotoxigenic E coli (ETEC) NOT DETECTED NOT DETECTED Final   Shiga like toxin producing E coli (STEC) NOT DETECTED NOT DETECTED Final   Shigella/Enteroinvasive E coli (EIEC) NOT DETECTED NOT DETECTED Final   Cryptosporidium NOT DETECTED NOT DETECTED Final   Cyclospora cayetanensis NOT DETECTED  NOT DETECTED Final   Entamoeba histolytica NOT DETECTED NOT DETECTED Final   Giardia lamblia NOT DETECTED NOT DETECTED Final   Adenovirus F40/41 NOT DETECTED NOT DETECTED Final   Astrovirus NOT DETECTED NOT DETECTED Final   Norovirus GI/GII NOT DETECTED NOT DETECTED Final   Rotavirus A NOT DETECTED NOT DETECTED Final   Sapovirus (I, II, IV, and V) NOT DETECTED NOT DETECTED Final    Comment: Performed at [facility], [address] [place], [place] [address]  C Difficile Quick Screen w PCR reflex     Status: None   Collection Time: 06/03/19  8:27 PM   Specimen: Stool  Result Value Ref Range Status   C Diff antigen NEGATIVE NEGATIVE Final   C Diff toxin NEGATIVE NEGATIVE Final   C Diff interpretation No C. difficile detected.  Final    Comment: Performed at [facility], [place], [place] [address]  SARS CORONAVIRUS 2 (TAT 6-24 HRS) Nasopharyngeal Nasopharyngeal Swab     Status: None   Collection Time: 06/03/19 10:53 PM   Specimen: Nasopharyngeal Swab  Result Value Ref Range Status   SARS Coronavirus 2 NEGATIVE NEGATIVE Final    Comment: (NOTE) SARS-CoV-2 target nucleic acids are NOT DETECTED. The SARS-CoV-2 RNA is generally detectable in upper and lower respiratory specimens during the acute phase of infection. Negative results do not preclude SARS-CoV-2 infection, do not rule out co-infections with other pathogens, and should not be used as the sole basis for treatment or other patient management decisions. Negative results must be combined with clinical observations, patient history, and epidemiological information. The expected result is Negative. Fact Sheet for Patients: SugarRoll.be Fact Sheet for Healthcare Providers: https://www.woods-mathews.com/ This test is not yet approved or cleared by the [place] FDA and  has been authorized for detection and/or diagnosis of SARS-CoV-2 by FDA under an  Emergency Use Authorization (EUA). This EUA will remain  in effect (meaning this test can be used) for the duration of the COVID-19 declaration under Section 56 4(b)(1) of the Act, 21 U.S.C. section 360bbb-3(b)(1), unless the authorization is terminated or revoked sooner. Performed at [facility], [place] [address] [place], [place] [address]      Labs: BNP (last 3 results) No results for input(s): BNP in the last 8760 hours. Basic Metabolic Panel: Recent Labs  Lab 06/03/19 1724 06/04/19 0449 06/05/19 0559 06/06/19 0537 06/07/19 0521  NA 130* 134* 134* 134* 134*  K 2.5* 3.9 2.8* 4.3 3.7  CL 92* 98 100 103 99  CO2 25 25 27 25 26   GLUCOSE 131* 103* 121* 107* 107*  BUN 27* 17 8 7* 6*  CREATININE 1.03* 0.82 0.63 0.68 0.64  CALCIUM 7.8* 7.7* 7.5* 7.6* 7.6*  MG 1.8 2.6* 2.3 2.5*  2.3  PHOS  --  1.8* 2.2* 2.1* 2.8   Liver Function Tests: Recent Labs  Lab 06/03/19 1724  AST 28  ALT 32  ALKPHOS 75  BILITOT 0.8  PROT 6.7  ALBUMIN 3.3*   Recent Labs  Lab 06/03/19 1724  LIPASE 17   No results for input(s): AMMONIA in the last 168 hours. CBC: Recent Labs  Lab 06/03/19 1724 06/04/19 0449 06/06/19 0537 06/07/19 0521  WBC 10.7* 9.8 8.0 11.2*  HGB 12.1 11.7* 11.3* 11.9*  HCT 36.1 35.2* 34.6* 36.3  MCV 81.9 82.4 84.4 84.2  PLT 226 233 308 417*   Cardiac Enzymes: No results for input(s): CKTOTAL, CKMB, CKMBINDEX, TROPONINI in the last 168 hours. BNP: Invalid input(s): POCBNP CBG: No results for input(s): GLUCAP in the last 168 hours. D-Dimer No results for input(s): DDIMER in the last 72 hours. Hgb A1c No results for input(s): HGBA1C in the last 72 hours. Lipid Profile No results for input(s): CHOL, HDL, LDLCALC, TRIG, CHOLHDL, LDLDIRECT in the last 72 hours. Thyroid function studies No results for input(s): TSH, T4TOTAL, T3FREE, THYROIDAB in the last 72 hours.  Invalid input(s): FREET3 Anemia work up No results for input(s): VITAMINB12, FOLATE,  FERRITIN, TIBC, IRON, RETICCTPCT in the last 72 hours. Urinalysis    Component Value Date/Time   COLORURINE YELLOW (A) 06/03/2019 1948   APPEARANCEUR HAZY (A) 06/03/2019 1948   APPEARANCEUR Hazy (A) 02/10/2019 1335   LABSPEC 1.013 06/03/2019 1948   PHURINE 5.0 06/03/2019 1948   GLUCOSEU NEGATIVE 06/03/2019 1948   HGBUR SMALL (A) 06/03/2019 [place] NEGATIVE 06/03/2019 1948   BILIRUBINUR Negative 02/10/2019 [place] NEGATIVE 06/03/2019 1948   PROTEINUR 30 (A) 06/03/2019 1948   NITRITE NEGATIVE 06/03/2019 1948   LEUKOCYTESUR SMALL (A) 06/03/2019 1948   Sepsis Labs Invalid input(s): PROCALCITONIN,  WBC,  LACTICIDVEN Microbiology Recent Results (from the past 240 hour(s))  Blood Culture (routine x 2)     Status: Abnormal   Collection Time: 06/03/19  7:26 PM   Specimen: BLOOD  Result Value Ref Range Status   Specimen Description   Final    BLOOD LEFT ANTECUBITAL Performed at [facility], [address] [place], [place] [address]    Special Requests   Final    BOTTLES DRAWN AEROBIC AND ANAEROBIC Blood Culture adequate volume Performed at [facility], [address], [place], [place] [address]    Culture  Setup Time   Final    GRAM NEGATIVE RODS AEROBIC BOTTLE ONLY CRITICAL RESULT CALLED TO, READ BACK BY AND VERIFIED WITH: CHRISTINE KATSOUDAS AT 3532 06/04/19 SDR Performed at [facility], [place] [address] [place], [place] [address]    Culture ESCHERICHIA COLI (A)  Final   Report Status 06/06/2019 FINAL  Final   Organism ID, Bacteria ESCHERICHIA COLI  Final      Susceptibility   Escherichia coli - MIC*    AMPICILLIN <=2 SENSITIVE Sensitive     CEFAZOLIN <=4 SENSITIVE Sensitive     CEFEPIME <=0.12 SENSITIVE Sensitive     CEFTAZIDIME <=1 SENSITIVE Sensitive     CEFTRIAXONE <=0.25 SENSITIVE Sensitive     CIPROFLOXACIN <=0.25 SENSITIVE Sensitive     GENTAMICIN <=1 SENSITIVE Sensitive     IMIPENEM <=0.25 SENSITIVE Sensitive      TRIMETH/SULFA <=20 SENSITIVE Sensitive     AMPICILLIN/SULBACTAM <=2 SENSITIVE Sensitive     PIP/TAZO <=4 SENSITIVE Sensitive     * ESCHERICHIA COLI  Blood Culture (routine x 2)  Status: None (Preliminary result)   Collection Time: 06/03/19  7:26 PM   Specimen: BLOOD  Result Value Ref Range Status   Specimen Description BLOOD RIGHT ANTECUBITAL  Final   Special Requests   Final    BOTTLES DRAWN AEROBIC AND ANAEROBIC Blood Culture adequate volume   Culture   Final    NO GROWTH 4 DAYS Performed at [facility], [address] [place], [place] [address]    Report Status PENDING  Incomplete  Blood Culture ID Panel (Reflexed)     Status: Abnormal   Collection Time: 06/03/19  7:26 PM  Result Value Ref Range Status   Enterococcus species NOT DETECTED NOT DETECTED Final   Vancomycin resistance NOT DETECTED NOT DETECTED Final   Listeria monocytogenes NOT DETECTED NOT DETECTED Final   Staphylococcus species NOT DETECTED NOT DETECTED Final   Staphylococcus aureus (BCID) NOT DETECTED NOT DETECTED Final   Methicillin resistance NOT DETECTED NOT DETECTED Final   Streptococcus species NOT DETECTED NOT DETECTED Final   Streptococcus agalactiae NOT DETECTED NOT DETECTED Final   Streptococcus pneumoniae NOT DETECTED NOT DETECTED Final   Streptococcus pyogenes NOT DETECTED NOT DETECTED Final   Acinetobacter baumannii NOT DETECTED NOT DETECTED Final   Enterobacteriaceae species DETECTED (A) NOT DETECTED Final    Comment: CRITICAL RESULT CALLED TO, READ BACK BY AND VERIFIED WITH:  CHRISTINE KATSOUDAS AT 7062 06/04/19 SDR Enterobacteriaceae represent a large family of gram-negative bacteria, not a single organism.    Enterobacter cloacae complex NOT DETECTED NOT DETECTED Final   Escherichia coli DETECTED (A) NOT DETECTED Final    Comment: CRITICAL RESULT CALLED TO, READ BACK BY AND VERIFIED WITH:  CHRISTINE KATSOUDAS AT 3762 06/04/19 SDR    Klebsiella oxytoca NOT DETECTED NOT DETECTED Final    Klebsiella pneumoniae NOT DETECTED NOT DETECTED Final   Proteus species NOT DETECTED NOT DETECTED Final   Serratia marcescens NOT DETECTED NOT DETECTED Final   Carbapenem resistance NOT DETECTED NOT DETECTED Final   Haemophilus influenzae NOT DETECTED NOT DETECTED Final   Neisseria meningitidis NOT DETECTED NOT DETECTED Final   Pseudomonas aeruginosa NOT DETECTED NOT DETECTED Final   Candida albicans NOT DETECTED NOT DETECTED Final   Candida glabrata NOT DETECTED NOT DETECTED Final   Candida krusei NOT DETECTED NOT DETECTED Final   Candida parapsilosis NOT DETECTED NOT DETECTED Final   Candida tropicalis NOT DETECTED NOT DETECTED Final    Comment: Performed at [facility], [address] [place], [place] [address]  Urine Culture     Status: Abnormal   Collection Time: 06/03/19  7:48 PM   Specimen: Urine, Random  Result Value Ref Range Status   Specimen Description   Final    URINE, RANDOM Performed at [facility], [address] [place], [place] [address]    Special Requests   Final    NONE Performed at [facility], [address] [place],  [address]    Culture >=100,000 COLONIES/mL ESCHERICHIA COLI (A)  Final   Report Status 06/06/2019 FINAL  Final   Organism ID, Bacteria ESCHERICHIA COLI (A)  Final      Susceptibility   Escherichia coli - MIC*    AMPICILLIN <=2 SENSITIVE Sensitive     CEFAZOLIN <=4 SENSITIVE Sensitive     CEFTRIAXONE <=0.25 SENSITIVE Sensitive     CIPROFLOXACIN <=0.25 SENSITIVE Sensitive     GENTAMICIN <=1 SENSITIVE Sensitive     IMIPENEM <=0.25 SENSITIVE Sensitive     NITROFURANTOIN <=16 SENSITIVE Sensitive  TRIMETH/SULFA <=20 SENSITIVE Sensitive     AMPICILLIN/SULBACTAM <=2 SENSITIVE Sensitive     PIP/TAZO <=4 SENSITIVE Sensitive     * >=100,000 COLONIES/mL ESCHERICHIA COLI  Gastrointestinal Panel by PCR , Stool     Status: None   Collection Time: 06/03/19  8:27 PM   Specimen: Stool  Result Value Ref  Range Status   Campylobacter species NOT DETECTED NOT DETECTED Final   Plesimonas shigelloides NOT DETECTED NOT DETECTED Final   Salmonella species NOT DETECTED NOT DETECTED Final   Yersinia enterocolitica NOT DETECTED NOT DETECTED Final   Vibrio species NOT DETECTED NOT DETECTED Final   Vibrio cholerae NOT DETECTED NOT DETECTED Final   Enteroaggregative E coli (EAEC) NOT DETECTED NOT DETECTED Final   Enteropathogenic E coli (EPEC) NOT DETECTED NOT DETECTED Final   Enterotoxigenic E coli (ETEC) NOT DETECTED NOT DETECTED Final   Shiga like toxin producing E coli (STEC) NOT DETECTED NOT DETECTED Final   Shigella/Enteroinvasive E coli (EIEC) NOT DETECTED NOT DETECTED Final   Cryptosporidium NOT DETECTED NOT DETECTED Final   Cyclospora cayetanensis NOT DETECTED NOT DETECTED Final   Entamoeba histolytica NOT DETECTED NOT DETECTED Final   Giardia lamblia NOT DETECTED NOT DETECTED Final   Adenovirus F40/41 NOT DETECTED NOT DETECTED Final   Astrovirus NOT DETECTED NOT DETECTED Final   Norovirus GI/GII NOT DETECTED NOT DETECTED Final   Rotavirus A NOT DETECTED NOT DETECTED Final   Sapovirus (I, II, IV, and V) NOT DETECTED NOT DETECTED Final    Comment: Performed at [facility], [address] [place], [place] [address]  C Difficile Quick Screen w PCR reflex     Status: None   Collection Time: 06/03/19  8:27 PM   Specimen: Stool  Result Value Ref Range Status   C Diff antigen NEGATIVE NEGATIVE Final   C Diff toxin NEGATIVE NEGATIVE Final   C Diff interpretation No C. difficile detected.  Final    Comment: Performed at [facility], [place], [place] [address]  SARS CORONAVIRUS 2 (TAT 6-24 HRS) Nasopharyngeal Nasopharyngeal Swab     Status: None   Collection Time: 06/03/19 10:53 PM   Specimen: Nasopharyngeal Swab  Result Value Ref Range Status   SARS Coronavirus 2 NEGATIVE NEGATIVE Final    Comment: (NOTE) SARS-CoV-2 target nucleic acids are NOT  DETECTED. The SARS-CoV-2 RNA is generally detectable in upper and lower respiratory specimens during the acute phase of infection. Negative results do not preclude SARS-CoV-2 infection, do not rule out co-infections with other pathogens, and should not be used as the sole basis for treatment or other patient management decisions. Negative results must be combined with clinical observations, patient history, and epidemiological information. The expected result is Negative. Fact Sheet for Patients: SugarRoll.be Fact Sheet for Healthcare Providers: https://www.woods-mathews.com/ This test is not yet approved or cleared by the [place] FDA and  has been authorized for detection and/or diagnosis of SARS-CoV-2 by FDA under an Emergency Use Authorization (EUA). This EUA will remain  in effect (meaning this test can be used) for the duration of the COVID-19 declaration under Section 56 4(b)(1) of the Act, 21 U.S.C. section 360bbb-3(b)(1), unless the authorization is terminated or revoked sooner. Performed at [facility], [place] [address] [place], [place] [address]      Time coordinating discharge: Over 30 minutes  SIGNED:   Ezekiel Slocumb, DO [facility] 06/07/2019, 3:05 PM   If 7PM-7AM, please contact night-coverage [email or web address]

## 2019-06-07 NOTE — Progress Notes (Signed)
Erin Petersen to be D/C'd Home per MD order.  Discussed prescriptions and follow up appointments with the patient. Prescriptions given to patient, medication list explained in detail. Pt verbalized understanding.  Allergies as of 06/07/2019      Reactions   Other Anaphylaxis   Bee Sting    Ciprofloxacin Hcl Rash   Mild rash after long term use   Sulfa Antibiotics Rash   Bactrim Bactrim, Cipro      Medication List    STOP taking these medications   hydrochlorothiazide 25 MG tablet Commonly known as: HYDRODIURIL     TAKE these medications   acetaminophen 650 MG CR tablet Commonly known as: TYLENOL Take 1,300 mg by mouth at bedtime.   albuterol 108 (90 Base) MCG/ACT inhaler Commonly known as: VENTOLIN HFA Inhale 2 puffs into the lungs every 6 (six) hours as needed for wheezing or shortness of breath.   alendronate 70 MG tablet Commonly known as: FOSAMAX Take 70 mg by mouth once a week. Take with a full glass of water on an empty stomach.   amLODipine 5 MG tablet Commonly known as: NORVASC Take 1 tablet (5 mg total) by mouth daily.   aspirin 81 MG EC tablet Take 81 mg by mouth at bedtime.   cephALEXin 500 MG capsule Commonly known as: KEFLEX Take 1 capsule (500 mg total) by mouth 4 (four) times daily for 4 days.   DIAZEPAM RE Place 10 mg rectally at bedtime.   diclofenac sodium 1 % Gel Commonly known as: VOLTAREN Apply 1 application topically 2 (two) times daily as needed (pain.).   dicyclomine 10 MG capsule Commonly known as: BENTYL TAKE 1 CAPSULE BY MOUTH AT  BEDTIME   Elmiron 100 MG capsule Generic drug: pentosan polysulfate Take 100 mg by mouth 3 (three) times daily. With meals   EPINEPHrine 0.3 mg/0.3 mL Soaj injection Commonly known as: EPI-PEN Inject 0.3 mLs (0.3 mg total) into the muscle as needed for anaphylaxis.   estradiol 0.1 MG/GM vaginal cream Commonly known as: ESTRACE Place 1 Applicatorful vaginally 3 (three) times a week. What changed:  when to take this   fluticasone 44 MCG/ACT inhaler Commonly known as: FLOVENT HFA Inhale 2 puffs into the lungs 2 (two) times daily.   fluticasone 50 MCG/ACT nasal spray Commonly known as: FLONASE Place 2 sprays into both nostrils daily. What changed: how much to take   hydrOXYzine 25 MG tablet Commonly known as: ATARAX/VISTARIL 1po  one hour before sleep   lidocaine 2 % jelly Commonly known as: XYLOCAINE Apply 1 application topically as needed (pain.).   lidocaine 5 % ointment Commonly known as: XYLOCAINE   linaclotide 72 MCG capsule Commonly known as: LINZESS Take 1 capsule (72 mcg total) by mouth daily before breakfast.   omeprazole 40 MG capsule Commonly known as: PRILOSEC Take 1 capsule (40 mg total) by mouth 2 (two) times daily before a meal.   ondansetron 4 MG tablet Commonly known as: Zofran Take 1 tablet (4 mg total) by mouth every 8 (eight) hours as needed for up to 7 days for nausea or vomiting.   polyethylene glycol 17 g packet Commonly known as: MIRALAX / GLYCOLAX Take 1 packet by mouth daily.   pregabalin 150 MG capsule Commonly known as: LYRICA Take 1 capsule (150 mg total) by mouth 2 (two) times daily.   rOPINIRole 0.5 MG tablet Commonly known as: REQUIP Take 1-3 tablets (0.5-1.5 mg total) by mouth See admin instructions. Take 1 tablet (0.5 mg) by mouth  in the morning, 1 tablet (0.5 mg) by mouth in the afternoon, & take 3 tablets (1.5 mg) by mouth at bedtime.   simvastatin 20 MG tablet Commonly known as: ZOCOR Take 0.5 tablets (10 mg total) by mouth at bedtime. What changed: how much to take   traMADol 50 MG tablet Commonly known as: ULTRAM Take 50 mg by mouth every 6 (six) hours as needed.   Vitamin B-12 3000 MCG Subl Take 3,000 mcg by mouth daily.   Vitamin D3 50 MCG (2000 UT) Tabs Take 2,000 Units by mouth daily.       Vitals:   06/07/19 0857 06/07/19 1221  BP: 136/62 (!) 130/59  Pulse: 76 71  Resp: 20   Temp: 99.3 F (37.4 C)  98.4 F (36.9 C)  SpO2: 96% 98%    Midline catheter discontinued intact. Site without signs and symptoms of complications. Dressing and pressure applied. Pt denies pain at this time. No complaints noted.  An After Visit Summary was printed and given to the patient. Patient escorted via [employer], and D/C home via private auto.  Marry Guan 06/07/2019 6:00 PM

## 2019-06-08 ENCOUNTER — Encounter: Payer: Self-pay | Admitting: Family Medicine

## 2019-06-08 ENCOUNTER — Encounter: Payer: Self-pay | Admitting: Physical Therapy

## 2019-06-08 LAB — CULTURE, BLOOD (ROUTINE X 2)
Culture: NO GROWTH
Special Requests: ADEQUATE

## 2019-06-09 ENCOUNTER — Telehealth: Payer: Self-pay

## 2019-06-09 NOTE — Telephone Encounter (Signed)
Transition Care Management Follow-up Telephone Call  Date of discharge and from where: 06/07/2019, [facility]   How have you been since you were released from the hospital? Spoke with patient and she stated that she has a rash that she is concerned about. Sent images in MyChart to the provider.   Any questions or concerns? Yes , see above  Items Reviewed:  Did the pt receive and understand the discharge instructions provided? Yes   Medications obtained and verified? Yes   Any new allergies since your discharge? No   Dietary orders reviewed? Yes  Do you have support at home? Yes   Functional Questionnaire: (I = Independent and D = Dependent) ADLs: I  Bathing/Dressing- I  Meal Prep- I  Eating- I  Maintaining continence- I  Transferring/Ambulation- I  Managing Meds- I  Follow up appointments reviewed:   PCP Hospital f/u appt confirmed? Yes  Scheduled to see Clarene Reamer, NP on 06/11/2019 @ 3 pm.  [facility] f/u appt confirmed? N/A   Are transportation arrangements needed? No   If their condition worsens, is the pt aware to call PCP or go to the Emergency Dept.? Yes  Was the patient provided with contact information for the PCP's office or ED? Yes  Was to pt encouraged to call back with questions or concerns? Yes

## 2019-06-09 NOTE — Telephone Encounter (Signed)
Please call patient and see how her rash is today? Does she have a fever? Was she discharged from hospital on new medications? Please advise her that mychart messages are for non urgent questions only, they are not monitored overnight or on the weekends.

## 2019-06-09 NOTE — Telephone Encounter (Signed)
Noted  

## 2019-06-11 ENCOUNTER — Other Ambulatory Visit: Payer: Self-pay

## 2019-06-11 ENCOUNTER — Ambulatory Visit: Payer: Medicare Other | Admitting: Dermatology

## 2019-06-11 ENCOUNTER — Ambulatory Visit (INDEPENDENT_AMBULATORY_CARE_PROVIDER_SITE_OTHER): Payer: Medicare Other | Admitting: Family Medicine

## 2019-06-11 ENCOUNTER — Encounter: Payer: Self-pay | Admitting: Family Medicine

## 2019-06-11 VITALS — BP 118/70 | HR 87 | Temp 97.9°F | Ht <= 58 in | Wt 161.0 lb

## 2019-06-11 DIAGNOSIS — E876 Hypokalemia: Secondary | ICD-10-CM | POA: Diagnosis not present

## 2019-06-11 DIAGNOSIS — Z09 Encounter for follow-up examination after completed treatment for conditions other than malignant neoplasm: Secondary | ICD-10-CM

## 2019-06-11 DIAGNOSIS — R7881 Bacteremia: Secondary | ICD-10-CM

## 2019-06-11 DIAGNOSIS — R21 Rash and other nonspecific skin eruption: Secondary | ICD-10-CM | POA: Diagnosis not present

## 2019-06-11 DIAGNOSIS — Z90710 Acquired absence of both cervix and uterus: Secondary | ICD-10-CM | POA: Insufficient documentation

## 2019-06-11 NOTE — Progress Notes (Signed)
Subjective:    Patient ID: Erin Petersen, female    DOB: 1940/12/04, 80 y.o.   MRN: [id number]  HPI Chief Complaint  Patient presents with  . Hospitalization Follow-up   This is a 79 yo female who presents today for hospital follow up. She was admitted 4/6-4/11/2019 for gastroenteritis, bacteremia. Has two more days of antibiotics (cephalexin).  Continues to have itchy rash on her abdomen. Some decreased itch, not spreading.   Nauseous, no vomiting. Tired. Drinking at least 64 ounces of water daily. Urine dark. Able to drink protein shakes, eat oatmeal, eating small amounts meat/potatoes, food brought to her by her neighbor. Taking ondansetron prior to antibiotic. Appetite improved, able to sit up longer. Doesn't feel like weakness, lightheadedness has improved.  Nose is very dry, has not been able to use cpap. Sleeping better.  No UTI symptoms now or prior to admission. No bm since discharge. Resumed Linzess. No fevers.   Past Medical History:  Diagnosis Date  . Arthritis   . Asthma    exercise induced asthma  . Colon polyps   . Dyspnea   . GERD (gastroesophageal reflux disease)   . Hyperlipidemia   . Hypertension   . Neuropathy    right leg and foot  . Plantar fasciitis   . Prediabetes   . RLS (restless legs syndrome)   . Sleep apnea    CPAP  . SOB (shortness of breath)   . Urinary incontinence   . Urinary tract infection   . Wears hearing aid in right ear    Past Surgical History:  Procedure Laterality Date  . ABDOMINAL HYSTERECTOMY  1972  . APPENDECTOMY  1958  . BACK SURGERY     lumbar fusion  . BILATERAL SALPINGOOPHORECTOMY  01/07/2018  . BIOPSY  04/15/2019   Procedure: BIOPSY;  Surgeon: [name], MD;  Location: [facility];  Service: Endoscopy;;  . CARDIAC CATHETERIZATION     over 10 yrs ago.  [place], [employer]  . COLONOSCOPY WITH PROPOFOL N/A 11/11/2018   Procedure: COLONOSCOPY WITH PROPOFOL;  Surgeon: [name],  MD;  Location: [facility] ENDOSCOPY;  Service: Gastroenterology;  Laterality: N/A;  . CYSTO WITH HYDRODISTENSION N/A 12/30/2018   Procedure: CYSTOSCOPY/HYDRODISTENSION WITH MARCAINE;  Surgeon: Bjorn Loser, MD;  Location: [facility];  Service: Urology;  Laterality: N/A;  . ESOPHAGOGASTRODUODENOSCOPY (EGD) WITH PROPOFOL N/A 04/15/2019   Procedure: ESOPHAGOGASTRODUODENOSCOPY (EGD) WITH PROPOFOL;  Surgeon: [name], MD;  Location: [facility];  Service: Endoscopy;  Laterality: N/A;  sleep apnea  . HERNIA REPAIR     abd  . HERNIA REPAIR     umbilical  . INGUINAL HERNIA REPAIR Bilateral    inguinal  . LAPAROSCOPIC BILATERAL SALPINGO OOPHERECTOMY    . SPINAL FUSION  2002  . TONSILLECTOMY AND ADENOIDECTOMY  1961   Family History  Problem Relation Age of Onset  . Alcohol abuse Mother   . Cancer Mother   . Stroke Mother   . Alcohol abuse Father   . Arthritis Sister   . Arthritis Daughter   . COPD Daughter   . Arthritis Paternal Grandmother   . Asthma Paternal Grandmother   . COPD Paternal Grandmother   . Breast cancer Paternal Aunt    Social History   Tobacco Use  . Smoking status: Never Smoker  . Smokeless tobacco: Never Used  Substance Use Topics  . Alcohol use: Not Currently  . Drug use: Never   Outpatient Encounter Medications as  of 06/11/2019  Medication Sig Note  . acetaminophen (TYLENOL) 650 MG CR tablet Take 1,300 mg by mouth at bedtime.   Marland Kitchen albuterol (VENTOLIN HFA) 108 (90 Base) MCG/ACT inhaler Inhale 2 puffs into the lungs every 6 (six) hours as needed for wheezing or shortness of breath.   Marland Kitchen alendronate (FOSAMAX) 70 MG tablet Take 70 mg by mouth once a week. Take with a full glass of water on an empty stomach.   Marland Kitchen amLODipine (NORVASC) 5 MG tablet Take 1 tablet (5 mg total) by mouth daily.   Marland Kitchen aspirin 81 MG EC tablet Take 81 mg by mouth at bedtime.    . [EXPIRED] cephALEXin (KEFLEX) 500 MG capsule Take 1 capsule (500 mg total) by mouth 4 (four) times daily  for 4 days.   . Cholecalciferol (VITAMIN D3) 50 MCG (2000 UT) TABS Take 2,000 Units by mouth daily.    . Cyanocobalamin (VITAMIN B-12) 3000 MCG SUBL Take 3,000 mcg by mouth daily.   Marland Kitchen DIAZEPAM RE Place 10 mg rectally at bedtime.   . diclofenac sodium (VOLTAREN) 1 % GEL Apply 1 application topically 2 (two) times daily as needed (pain.).  12/19/2018: On hand  . dicyclomine (BENTYL) 10 MG capsule TAKE 1 CAPSULE BY MOUTH AT  BEDTIME   . EPINEPHrine 0.3 mg/0.3 mL IJ SOAJ injection Inject 0.3 mLs (0.3 mg total) into the muscle as needed for anaphylaxis.   Marland Kitchen estradiol (ESTRACE) 0.1 MG/GM vaginal cream Place 1 Applicatorful vaginally 3 (three) times a week. (Patient taking differently: Place 1 Applicatorful vaginally at bedtime. )   . fluticasone (FLONASE) 50 MCG/ACT nasal spray Place 2 sprays into both nostrils daily. (Patient taking differently: Place 1 spray into both nostrils daily. )   . fluticasone (FLOVENT HFA) 44 MCG/ACT inhaler Inhale 2 puffs into the lungs 2 (two) times daily.    . hydrOXYzine (ATARAX/VISTARIL) 25 MG tablet 1po  one hour before sleep   . lidocaine (XYLOCAINE) 2 % jelly Apply 1 application topically as needed (pain.).   Marland Kitchen lidocaine (XYLOCAINE) 5 % ointment    . linaclotide (LINZESS) 72 MCG capsule Take 1 capsule (72 mcg total) by mouth daily before breakfast.   . ondansetron (ZOFRAN) 4 MG tablet Take 1 tablet (4 mg total) by mouth every 8 (eight) hours as needed for up to 7 days for nausea or vomiting.   . pentosan polysulfate (ELMIRON) 100 MG capsule Take 100 mg by mouth 3 (three) times daily. With meals   . polyethylene glycol (MIRALAX / GLYCOLAX) 17 g packet Take 1 packet by mouth daily.    . pregabalin (LYRICA) 150 MG capsule Take 1 capsule (150 mg total) by mouth 2 (two) times daily.   Marland Kitchen rOPINIRole (REQUIP) 0.5 MG tablet Take 1-3 tablets (0.5-1.5 mg total) by mouth See admin instructions. Take 1 tablet (0.5 mg) by mouth in the morning, 1 tablet (0.5 mg) by mouth in the  afternoon, & take 3 tablets (1.5 mg) by mouth at bedtime.   . simvastatin (ZOCOR) 20 MG tablet Take 0.5 tablets (10 mg total) by mouth at bedtime. (Patient taking differently: Take 20 mg by mouth at bedtime. )   . traMADol (ULTRAM) 50 MG tablet Take 50 mg by mouth every 6 (six) hours as needed.   Marland Kitchen omeprazole (PRILOSEC) 40 MG capsule Take 1 capsule (40 mg total) by mouth 2 (two) times daily before a meal.    No facility-administered encounter medications on file as of 06/11/2019.     Review of  Systems Per HPI    Objective:   Physical Exam Vitals reviewed.  Constitutional:      General: She is not in acute distress.    Appearance: She is obese. She is ill-appearing. She is not toxic-appearing or diaphoretic.  HENT:     Head: Normocephalic and atraumatic.  Cardiovascular:     Rate and Rhythm: Normal rate and regular rhythm.     Heart sounds: Normal heart sounds.  Pulmonary:     Effort: Pulmonary effort is normal.     Breath sounds: Normal breath sounds.  Skin:    General: Skin is warm and dry.     Findings: Rash (mild, erythematous, macular rash on bilateral low abdomen. ) present.  Neurological:     Mental Status: She is alert and oriented to person, place, and time.  Psychiatric:        Mood and Affect: Mood normal.        Behavior: Behavior normal.        Thought Content: Thought content normal.        Judgment: Judgment normal.       BP 118/70 (BP Location: Left Arm, Patient Position: Sitting, Cuff Size: Normal)   Pulse 87   Temp 97.9 F (36.6 C) (Temporal)   Ht 4' 10"  (1.473 m)   Wt 161 lb (73 kg)   LMP  (LMP Unknown)   SpO2 97%   BMI 33.65 kg/m  Wt Readings from Last 3 Encounters:  06/11/19 161 lb (73 kg)  06/04/19 163 lb 12.8 oz (74.3 kg)  04/21/19 167 lb 6.4 oz (75.9 kg)       Orthostatic VS for the past 72 hrs (Last 3 readings):  Orthostatic BP Patient Position BP Location Cuff Size Orthostatic Pulse  06/11/19 1650 118/60 Standing -- -- 82  06/11/19  1649 128/62 Sitting -- -- 84  06/11/19 1648 132/64 Supine -- -- 74  06/11/19 1457 -- Sitting Left Arm Normal --    Assessment & Plan:  1. Hospital discharge follow-up - reviewed records, patient medications, discharge instructions  2. Hypokalemia - Comprehensive metabolic panel - CBC with Differential  3. Bacteremia - she will finish oral antibiotic tomorrow - Comprehensive metabolic panel - CBC with Differential  4. Rash - unclear etiology, not worsening, ? Related to viral gastroenteritis  - follow up to be determined by labs/ patient's progress  This visit occurred during the SARS-CoV-2 public health emergency.  Safety protocols were in place, including screening questions prior to the visit, additional usage of staff PPE, and extensive cleaning of exam room while observing appropriate contact time as indicated for disinfecting solutions.      Clarene Reamer, FNP-BC  [facility]  06/12/2019 11:28 AM

## 2019-06-11 NOTE — Patient Instructions (Signed)
Good to see you today, I am sorry you have had such a rough time!  I will notify you of lab results  Take 1/2 dose Miralax tonight, if no bowel movement by lunch tomorrow, take a full dose. Remember that ondansetron can make you constipated, only take as needed

## 2019-06-12 ENCOUNTER — Encounter: Payer: Self-pay | Admitting: Family Medicine

## 2019-06-12 LAB — COMPREHENSIVE METABOLIC PANEL
ALT: 14 U/L (ref 0–35)
AST: 21 U/L (ref 0–37)
Albumin: 3.5 g/dL (ref 3.5–5.2)
Alkaline Phosphatase: 90 U/L (ref 39–117)
BUN: 15 mg/dL (ref 6–23)
CO2: 26 mEq/L (ref 19–32)
Calcium: 9.4 mg/dL (ref 8.4–10.5)
Chloride: 99 mEq/L (ref 96–112)
Creatinine, Ser: 0.7 mg/dL (ref 0.40–1.20)
GFR: 80.99 mL/min (ref >60.00)
Glucose, Bld: 92 mg/dL (ref 70–99)
Potassium: 4.3 mEq/L (ref 3.5–5.1)
Sodium: 134 mEq/L — ABNORMAL LOW (ref 135–145)
Total Bilirubin: 0.3 mg/dL (ref 0.2–1.2)
Total Protein: 7.2 g/dL (ref 6.0–8.3)

## 2019-06-12 LAB — CBC WITH DIFFERENTIAL/PLATELET
Basophils Absolute: 0.1 10*3/uL (ref 0.0–0.1)
Basophils Relative: 0.7 % (ref 0.0–3.0)
Eosinophils Absolute: 0.1 10*3/uL (ref 0.0–0.7)
Eosinophils Relative: 0.8 % (ref 0.0–5.0)
HCT: 36.8 % (ref 36.0–46.0)
Hemoglobin: 11.9 g/dL — ABNORMAL LOW (ref 12.0–15.0)
Lymphocytes Relative: 12.4 % (ref 12.0–46.0)
Lymphs Abs: 1.6 10*3/uL (ref 0.7–4.0)
MCHC: 32.4 g/dL (ref 30.0–36.0)
MCV: 84.4 fl (ref 78.0–100.0)
Monocytes Absolute: 1.2 10*3/uL — ABNORMAL HIGH (ref 0.1–1.0)
Monocytes Relative: 9.1 % (ref 3.0–12.0)
Neutro Abs: 10.2 10*3/uL — ABNORMAL HIGH (ref 1.4–7.7)
Neutrophils Relative %: 77 % (ref 43.0–77.0)
Platelets: 836 10*3/uL — ABNORMAL HIGH (ref 150.0–400.0)
RBC: 4.37 Mil/uL (ref 3.87–5.11)
RDW: 14.6 % (ref 11.5–15.5)
WBC: 13.2 10*3/uL — ABNORMAL HIGH (ref 4.0–10.5)

## 2019-06-12 MED ORDER — CEPHALEXIN 500 MG PO CAPS: 500.0000 mg | ORAL_CAPSULE | Freq: Four times a day (QID) | ORAL | 0 refills | Status: AC

## 2019-06-12 MED ORDER — ONDANSETRON HCL 4 MG PO TABS: 4.0000 mg | ORAL_TABLET | Freq: Three times a day (TID) | ORAL | 0 refills | Status: AC | PRN

## 2019-06-12 NOTE — Addendum Note (Signed)
Addended by: Clarene Reamer B on: 06/12/2019 02:24 PM   Modules accepted: Orders

## 2019-06-13 ENCOUNTER — Other Ambulatory Visit (INDEPENDENT_AMBULATORY_CARE_PROVIDER_SITE_OTHER): Payer: Medicare Other

## 2019-06-13 ENCOUNTER — Other Ambulatory Visit: Payer: Medicare Other

## 2019-06-13 ENCOUNTER — Encounter: Payer: Medicare Other | Admitting: Physical Therapy

## 2019-06-13 ENCOUNTER — Other Ambulatory Visit: Payer: Self-pay

## 2019-06-13 DIAGNOSIS — R7881 Bacteremia: Secondary | ICD-10-CM | POA: Diagnosis not present

## 2019-06-13 LAB — CBC WITH DIFFERENTIAL/PLATELET
Basophils Absolute: 0 10*3/uL (ref 0.0–0.1)
Basophils Relative: 0.7 % (ref 0.0–3.0)
Eosinophils Absolute: 0.1 10*3/uL (ref 0.0–0.7)
Eosinophils Relative: 1.5 % (ref 0.0–5.0)
HCT: 35.7 % — ABNORMAL LOW (ref 36.0–46.0)
Hemoglobin: 12.1 g/dL (ref 12.0–15.0)
Lymphocytes Relative: 21.2 % (ref 12.0–46.0)
Lymphs Abs: 1.6 10*3/uL (ref 0.7–4.0)
MCHC: 33.9 g/dL (ref 30.0–36.0)
MCV: 82.5 fl (ref 78.0–100.0)
Monocytes Absolute: 0.9 10*3/uL (ref 0.1–1.0)
Monocytes Relative: 12 % (ref 3.0–12.0)
Neutro Abs: 4.9 10*3/uL (ref 1.4–7.7)
Neutrophils Relative %: 64.6 % (ref 43.0–77.0)
Platelets: 777 10*3/uL — ABNORMAL HIGH (ref 150.0–400.0)
RBC: 4.33 Mil/uL (ref 3.87–5.11)
RDW: 14.5 % (ref 11.5–15.5)
WBC: 7.5 10*3/uL (ref 4.0–10.5)

## 2019-06-14 LAB — URINE CULTURE
MICRO NUMBER:: 10373166
Result:: NO GROWTH
SPECIMEN QUALITY:: ADEQUATE

## 2019-06-16 ENCOUNTER — Other Ambulatory Visit: Payer: Self-pay

## 2019-06-17 ENCOUNTER — Encounter: Payer: Self-pay | Admitting: Gastroenterology

## 2019-06-17 ENCOUNTER — Ambulatory Visit: Payer: Medicare Other | Admitting: Gastroenterology

## 2019-06-17 ENCOUNTER — Encounter: Payer: Medicare Other | Admitting: Physical Therapy

## 2019-06-17 ENCOUNTER — Ambulatory Visit: Payer: Medicare Other | Admitting: Physical Therapy

## 2019-06-17 ENCOUNTER — Other Ambulatory Visit: Payer: Self-pay

## 2019-06-17 VITALS — BP 143/75 | HR 65 | Temp 98.1°F | Ht <= 58 in | Wt 159.2 lb

## 2019-06-17 DIAGNOSIS — M722 Plantar fascial fibromatosis: Secondary | ICD-10-CM | POA: Diagnosis not present

## 2019-06-17 DIAGNOSIS — M4125 Other idiopathic scoliosis, thoracolumbar region: Secondary | ICD-10-CM | POA: Diagnosis not present

## 2019-06-17 DIAGNOSIS — G8929 Other chronic pain: Secondary | ICD-10-CM | POA: Diagnosis not present

## 2019-06-17 DIAGNOSIS — K269 Duodenal ulcer, unspecified as acute or chronic, without hemorrhage or perforation: Secondary | ICD-10-CM | POA: Diagnosis not present

## 2019-06-17 DIAGNOSIS — K5909 Other constipation: Secondary | ICD-10-CM

## 2019-06-17 DIAGNOSIS — M25561 Pain in right knee: Secondary | ICD-10-CM | POA: Diagnosis not present

## 2019-06-17 DIAGNOSIS — M2170 Unequal limb length (acquired), unspecified site: Secondary | ICD-10-CM

## 2019-06-17 DIAGNOSIS — R2689 Other abnormalities of gait and mobility: Secondary | ICD-10-CM | POA: Diagnosis not present

## 2019-06-17 MED ORDER — OMEPRAZOLE 40 MG PO CPDR: 40.0000 mg | DELAYED_RELEASE_CAPSULE | Freq: Two times a day (BID) | ORAL | 1 refills | Status: DC

## 2019-06-17 MED ORDER — LINACLOTIDE 72 MCG PO CAPS: 72.0000 ug | ORAL_CAPSULE | Freq: Every day | ORAL | 1 refills | Status: DC

## 2019-06-17 NOTE — Progress Notes (Signed)
[name], MD [address]  [place]  [place], [place] [address]  Main: [phone]  Fax: [phone]    Gastroenterology Consultation  Referring Provider:     Elby Beck, FNP Primary Care Physician:  Elby Beck, FNP Primary Gastroenterologist:  Dr. [name] Reason for Consultation: Nausea, regurgitation        HPI:   Erin Petersen is a 79 y.o. female referred by Dr. Carlean Purl, Dalbert Batman, FNP  for consultation & management of right lower quadrant pain.  Patient reports having approximately 4 years history of right lower quadrant pain that has progressed over years.  She recently moved from [place] to live with her daughter in 08/2018.  Over the last 4 years, she was evaluated by general surgery and was found to have inguinal hernias which were thought to have caused the pain, underwent hernia repair.  The pain persisted, later evaluated by OB/GYN for ovarian cyst and underwent bilateral oophorectomy.  The pain still persisted.  She then saw urology for possible bladder etiology as she was having urinary urgency nocturnally.  She underwent CT abdomen and pelvis in [place] and was found to have thickening of the terminal ileum, mesenteric edema as well as fluid in the cul-de-sac.  Therefore, she is referred to GI for further evaluation and rule out Crohn's.  Patient reports that the pain has become progressive in last 1 year, worse lying on right side, wakes her up from sleep.  Patient has been taking Advil at night in addition to meloxicam.  She also describes fatigue, large joint arthralgias.  She never smoked in her life.  Does not drink alcohol.  The severity of pain has significantly impacted her physical activity Patient denies having exposure to TB.  She started on amitriptyline, meloxicam and hydroxyzine for her lower abdominal pain.  Patient also reports localized patchy area of burning and tingling in her left lateral upper  thigh  Follow-up visit in 12/03/2018  Patient underwent colonoscopy to evaluate for abnormal CT of the terminal ileum.  It was unremarkable including TI biopsies.  There was no evidence of inflammatory bowel disease.  I empirically started her on Bentyl at bedtime which has significantly improved right lower quadrant discomfort.  She does not wake up during sleep due to severe pain.  She has chronic cystitis for which she is currently being followed by urology.  Patient reports that she has been taking Dexilant 60 mg for chronic GERD.  She does report regurgitation of food, known small hiatal hernia.  When she tried to stop Dexilant, she developed severe reflux symptoms in 3 days after stopping the medication. She used to take Nexium for several years which stopped working.  Therefore, switched to [place].  She reports having an upper endoscopy and possibly a pH test about 20 years ago.  She denies dysphagia.  Follow-up visit 04/08/2019 Patient weaned off Dexilant from 30 mg to Pepcid as needed since last visit.  She noticed recurrence of regurgitation as well as severe nausea when she decreased Dexilant to 42m. Her symptoms got worse when she was completely off Dexilant.  She is here to discuss about further management.  Patient also gained about 7 pounds since last visit.  She is also suffering from constipation since increasing gabapentin as well as on diazepam for her interstitial nephritis.  She takes MiraLAX as needed.  She is also dealing with flareup of plantar fascitis which is limiting her physical activity.  Patient drinks  decaffeinated coffee daily.  She does eat chocolate regularly. Patient has followed up with urologist for second opinion for interstitial cystitis at [facility] who started her on Elmiron, hydroxyzine and amitriptyline.  Patient is not taking amitriptyline.  Follow-up visit 06/17/2019 Since last visit, patient underwent upper endoscopy which revealed  clean-based duodenal ulcer.  I started her on omeprazole 40 mg 2 times a day sucralfate.  Earlier in April, patient was admitted to [facility] secondary to pyelonephritis E. coli UTI which has been treated with Keflex and.  She also had 2 days of nonbloody diarrhea.  Stool studies were negative for infectious diarrhea.  Her diarrhea is currently resolved.  Today, patient like to know if she can restart her meloxicam for severe arthritis of her right knee.  She is currently on a right knee brace due to severe pain and unable to take any nonsteroidal anti-inflammatory medication secondary to peptic ulcer disease.  There is no evidence of edema.  She is currently on omeprazole 40 mg 2 times a day for a peptic ulcer disease.  Patient is taking linaclotide 72 MCG daily which is controlling her constipation  She does not smoke cigarettes or drink alcohol regularly  NSAIDs: Advil and meloxicam daily  Antiplts/Anticoagulants/Anti thrombotics: None  GI Procedures: Colonoscopy in 2018, reportedly normal  Colonoscopy 11/11/2018 - The examined portion of the ileum was normal. Biopsied. - Four 2 to 5 mm polyps in the descending colon and in the ascending colon, removed with a cold snare. Resected and retrieved. - The distal rectum and anal verge are normal on retroflexion view. - Normal mucosa in the entire examined colon.  DIAGNOSIS:  A. TERMINAL ILEUM; COLD BIOPSY:  - ILEAL MUCOSA WITH PROMINENT INTRAMUCOSAL LYMPHOID AGGREGATES.  - NEGATIVE FOR ACTIVE INFLAMMATION, DYSPLASIA, AND MALIGNANCY.   B. COLON POLYP X2, ASCENDING; COLD SNARE:  - TUBULAR ADENOMA, TWO FRAGMENTS.  - NEGATIVE FOR HIGH-GRADE DYSPLASIA AND MALIGNANCY.   C. COLON POLYP X2, DESCENDING; COLD SNARE:  - TUBULAR ADENOMA, ONE FRAGMENT.  - POLYPOID BENIGN COLONIC MUCOSA, ONE FRAGMENT.  - NEGATIVE FOR HIGH-GRADE DYSPLASIA AND MALIGNANCY.  Upper endoscopy 04/15/2019 - Non-bleeding duodenal ulcer with a clean ulcer base ([place] Class III).  Biopsied. - Normal second portion of the duodenum. - Erythematous mucosa in the gastric body. Biopsied. - Normal incisura and antrum. Biopsied. - Esophagogastric landmarks identified. - Normal gastroesophageal junction and esophagus.  DIAGNOSIS:  A. DUODENAL ULCER; COLD BIOPSY:  - ENTERIC MUCOSA WITH FEATURES OF PEPTIC DUODENITIS.  - NEGATIVE FOR FEATURES OF CELIAC, DYSPLASIA, AND MALIGNANCY.   B. STOMACH, RANDOM; COLD BIOPSY:  - GASTRIC ANTRAL AND OXYNTIC MUCOSA WITH FEATURES OF REACTIVE  GASTROPATHY AND PPI EFFECT.  - NEGATIVE FOR H. PYLORI, DYSPLASIA, AND MALIGNANCY.    Past Medical History:  Diagnosis Date  . Arthritis   . Asthma    exercise induced asthma  . Colon polyps   . Dyspnea   . GERD (gastroesophageal reflux disease)   . Hyperlipidemia   . Hypertension   . Neuropathy    right leg and foot  . Plantar fasciitis   . Prediabetes   . RLS (restless legs syndrome)   . Sleep apnea    CPAP  . SOB (shortness of breath)   . Urinary incontinence   . Urinary tract infection   . Wears hearing aid in right ear     Past Surgical History:  Procedure Laterality Date  . ABDOMINAL HYSTERECTOMY  1972  . APPENDECTOMY  1958  .  BACK SURGERY     lumbar fusion  . BILATERAL SALPINGOOPHORECTOMY  01/07/2018  . BIOPSY  04/15/2019   Procedure: BIOPSY;  Surgeon: [name], MD;  Location: [facility];  Service: Endoscopy;;  . CARDIAC CATHETERIZATION     over 10 yrs ago.  [place], [employer]  . COLONOSCOPY WITH PROPOFOL N/A 11/11/2018   Procedure: COLONOSCOPY WITH PROPOFOL;  Surgeon: [name], MD;  Location: [facility];  Service: Gastroenterology;  Laterality: N/A;  . CYSTO WITH HYDRODISTENSION N/A 12/30/2018   Procedure: CYSTOSCOPY/HYDRODISTENSION WITH MARCAINE;  Surgeon: Bjorn Loser, MD;  Location: [facility];  Service: Urology;  Laterality: N/A;  . ESOPHAGOGASTRODUODENOSCOPY (EGD) WITH PROPOFOL N/A 04/15/2019   Procedure:  ESOPHAGOGASTRODUODENOSCOPY (EGD) WITH PROPOFOL;  Surgeon: [name], MD;  Location: [facility];  Service: Endoscopy;  Laterality: N/A;  sleep apnea  . HERNIA REPAIR     abd  . HERNIA REPAIR     umbilical  . INGUINAL HERNIA REPAIR Bilateral    inguinal  . LAPAROSCOPIC BILATERAL SALPINGO OOPHERECTOMY    . SPINAL FUSION  2002  . TONSILLECTOMY AND ADENOIDECTOMY  1961    Current Outpatient Medications:  .  acetaminophen (TYLENOL) 650 MG CR tablet, Take 1,300 mg by mouth at bedtime., Disp: , Rfl:  .  albuterol (VENTOLIN HFA) 108 (90 Base) MCG/ACT inhaler, Inhale 2 puffs into the lungs every 6 (six) hours as needed for wheezing or shortness of breath., Disp: 18 g, Rfl: 2 .  alendronate (FOSAMAX) 70 MG tablet, Take 70 mg by mouth once a week. Take with a full glass of water on an empty stomach., Disp: , Rfl:  .  amLODipine (NORVASC) 5 MG tablet, Take 1 tablet (5 mg total) by mouth daily., Disp: 90 tablet, Rfl: 3 .  aspirin 81 MG EC tablet, Take 81 mg by mouth at bedtime. , Disp: , Rfl:  .  cephALEXin (KEFLEX) 500 MG capsule, Take 1 capsule (500 mg total) by mouth 4 (four) times daily for 5 days., Disp: 20 capsule, Rfl: 0 .  Cholecalciferol (VITAMIN D3) 50 MCG (2000 UT) TABS, Take 2,000 Units by mouth daily. , Disp: , Rfl:  .  Cyanocobalamin (VITAMIN B-12) 3000 MCG SUBL, Take 3,000 mcg by mouth daily., Disp: , Rfl:  .  DIAZEPAM RE, Place 10 mg rectally at bedtime., Disp: , Rfl:  .  diclofenac sodium (VOLTAREN) 1 % GEL, Apply 1 application topically 2 (two) times daily as needed (pain.). , Disp: , Rfl:  .  dicyclomine (BENTYL) 10 MG capsule, TAKE 1 CAPSULE BY MOUTH AT  BEDTIME, Disp: 90 capsule, Rfl: 0 .  EPINEPHrine 0.3 mg/0.3 mL IJ SOAJ injection, Inject 0.3 mLs (0.3 mg total) into the muscle as needed for anaphylaxis., Disp: 1 each, Rfl: 1 .  estradiol (ESTRACE) 0.1 MG/GM vaginal cream, Place 1 Applicatorful vaginally 3 (three) times a week. (Patient taking differently: Place 1  Applicatorful vaginally at bedtime. ), Disp: 42.5 g, Rfl: 3 .  fluticasone (FLONASE) 50 MCG/ACT nasal spray, Place 2 sprays into both nostrils daily. (Patient taking differently: Place 1 spray into both nostrils daily. ), Disp: 16 g, Rfl: 3 .  hydrOXYzine (ATARAX/VISTARIL) 25 MG tablet, 1po  one hour before sleep, Disp: , Rfl:  .  lidocaine (XYLOCAINE) 2 % jelly, Apply 1 application topically as needed (pain.)., Disp: , Rfl:  .  lidocaine (XYLOCAINE) 5 % ointment, , Disp: , Rfl:  .  linaclotide (LINZESS) 72 MCG capsule, Take 1 capsule (72  mcg total) by mouth daily before breakfast., Disp: 90 capsule, Rfl: 1 .  ondansetron (ZOFRAN) 4 MG tablet, Take 1 tablet (4 mg total) by mouth every 8 (eight) hours as needed for up to 10 days for nausea or vomiting., Disp: 30 tablet, Rfl: 0 .  pentosan polysulfate (ELMIRON) 100 MG capsule, Take 100 mg by mouth 3 (three) times daily. With meals, Disp: , Rfl:  .  polyethylene glycol (MIRALAX / GLYCOLAX) 17 g packet, Take 1 packet by mouth daily. , Disp: , Rfl:  .  pregabalin (LYRICA) 150 MG capsule, Take 1 capsule (150 mg total) by mouth 2 (two) times daily., Disp: 20 capsule, Rfl: 0 .  rOPINIRole (REQUIP) 0.5 MG tablet, Take 1-3 tablets (0.5-1.5 mg total) by mouth See admin instructions. Take 1 tablet (0.5 mg) by mouth in the morning, 1 tablet (0.5 mg) by mouth in the afternoon, & take 3 tablets (1.5 mg) by mouth at bedtime., Disp: 450 tablet, Rfl: 3 .  simvastatin (ZOCOR) 20 MG tablet, Take 0.5 tablets (10 mg total) by mouth at bedtime. (Patient taking differently: Take 20 mg by mouth at bedtime. ), Disp: 90 tablet, Rfl: 3 .  traMADol (ULTRAM) 50 MG tablet, Take 50 mg by mouth every 6 (six) hours as needed., Disp: , Rfl:  .  omeprazole (PRILOSEC) 40 MG capsule, Take 1 capsule (40 mg total) by mouth 2 (two) times daily before a meal., Disp: 180 capsule, Rfl: 1   Family History  Problem Relation Age of Onset  . Alcohol abuse Mother   . Cancer Mother   . Stroke  Mother   . Alcohol abuse Father   . Arthritis Sister   . Arthritis Daughter   . COPD Daughter   . Arthritis Paternal Grandmother   . Asthma Paternal Grandmother   . COPD Paternal Grandmother   . Breast cancer Paternal Aunt      Social History   Tobacco Use  . Smoking status: Never Smoker  . Smokeless tobacco: Never Used  Substance Use Topics  . Alcohol use: Not Currently  . Drug use: Never    Allergies as of 06/17/2019 - Review Complete 06/17/2019  Allergen Reaction Noted  . Other Anaphylaxis 12/03/2018  . Ciprofloxacin hcl Rash 12/29/2016  . Sulfa antibiotics Rash 06/10/2012    Review of Systems:    All systems reviewed and negative except where noted in HPI.   Physical Exam:  BP (!) 143/75 (BP Location: Left Arm, Patient Position: Sitting, Cuff Size: Normal)   Pulse 65   Temp 98.1 F (36.7 C) (Oral)   Ht 4' 10"  (1.473 m)   Wt 159 lb 4 oz (72.2 kg)   LMP  (LMP Unknown)   BMI 33.28 kg/m  No LMP recorded (lmp unknown). Patient has had a hysterectomy.  General:   Alert,  Well-developed, well-nourished, pleasant and cooperative in NAD Head:  Normocephalic and atraumatic. Eyes:  Sclera clear, no icterus.   Conjunctiva pink. Ears:  Normal auditory acuity. Nose:  No deformity, discharge, or lesions. Mouth:  No deformity or lesions,oropharynx pink & moist. Neck:  Supple; no masses or thyromegaly. Lungs:  Respirations even and unlabored.  Clear throughout to auscultation.   No wheezes, crackles, or rhonchi. No acute distress. Heart:  Regular rate and rhythm; no murmurs, clicks, rubs, or gallops. Abdomen:  Normal bowel sounds. Soft, nontender and non-distended without masses, hepatosplenomegaly or hernias noted.  No guarding or rebound tenderness.   Rectal: Not performed Msk:  Symmetrical without gross deformities.  Decreased movement in her right lower extremity due to knee osteoarthritis Pulses:  Normal pulses noted. Extremities:  No clubbing or edema.  No  cyanosis. Neurologic:  Alert and oriented x3;  grossly normal neurologically. Skin:  Intact without significant lesions or rashes. No jaundice. Psych:  Alert and cooperative. Normal mood and affect.  Imaging Studies: Reviewed  Assessment and Plan:   SUPRENA TRAVAGLINI is a 79 y.o. female with history of metabolic syndrome, chronic right lower quadrant pain, history of hernia repair, bilateral oophorectomy, interstitial cystitis seen for follow-up of constipation, peptic ulcer disease  Because of disease, clean-based duodenal ulcer based on EGD in 03/2019 performed for nausea and regurgitation Continue omeprazole 40 mg twice daily Reiterated to the patient that she has to stay on acid suppressive therapy as long as she is on nonsteroidal anti-inflammatory medication for osteoarthritis Since she would like to go back on meloxicam for osteoarthritis, I suggested her to have a repeat endoscopy in 2 months to check the status of duodenal ulcer  Chronic right lower quadrant pain Work-up unremarkable from GI standpoint.  Her pain is most likely related to interstitial cystitis Follow-up with urology  Severe iron deficiency, microcytosis without anemia contributing to chronic fatigue and restless legs Treated with took fusion plus for 2 months Microcytosis has resolved, hemoglobin has improved  Chronic constipation: Currently well controlled on Linzess 72 MCG daily Continue the current dose for now  Follow up in 3-4 months   [name], MD

## 2019-06-17 NOTE — Patient Instructions (Signed)
seat marching  2 min  3 different times  Of day    ____   Wear CPAP every night    ___  Bring shoes in next session

## 2019-06-18 ENCOUNTER — Encounter: Payer: Self-pay | Admitting: Family Medicine

## 2019-06-18 NOTE — Therapy (Addendum)
[place] MAIN [facility] SERVICES [address] [place], [place], [address] Phone: [phone]   Fax:  [phone]  Physical Therapy Evaluation  Patient Details  Name: Erin Petersen MRN: [id number] Date of Birth: May 18, 1940 Referring Provider (PT): Carlean Purl, NP   Encounter Date: 06/17/2019  PT End of Session - 06/17/19 2331    Visit Number  1    Number of Visits  10    Date for PT Re-Evaluation  08/27/19   eval 06/17/19   Equipment Utilized During Treatment  Gait belt    Activity Tolerance  Patient tolerated treatment well    Behavior During Therapy  [facility] for tasks assessed/performed       Past Medical History:  Diagnosis Date  . Arthritis   . Asthma    exercise induced asthma  . Colon polyps   . Dyspnea   . GERD (gastroesophageal reflux disease)   . Hyperlipidemia   . Hypertension   . Neuropathy    right leg and foot  . Plantar fasciitis   . Prediabetes   . RLS (restless legs syndrome)   . Sleep apnea    CPAP  . SOB (shortness of breath)   . Urinary incontinence   . Urinary tract infection   . Wears hearing aid in right ear     Past Surgical History:  Procedure Laterality Date  . ABDOMINAL HYSTERECTOMY  1972  . APPENDECTOMY  1958  . BACK SURGERY     lumbar fusion  . BILATERAL SALPINGOOPHORECTOMY  01/07/2018  . BIOPSY  04/15/2019   Procedure: BIOPSY;  Surgeon: [name], MD;  Location: [place];  Service: Endoscopy;;  . CARDIAC CATHETERIZATION     over 10 yrs ago.  [place] [employer]  . COLONOSCOPY WITH PROPOFOL N/A 11/11/2018   Procedure: COLONOSCOPY WITH PROPOFOL;  Surgeon: [name], MD;  Location: [facility] ENDOSCOPY;  Service: Gastroenterology;  Laterality: N/A;  . CYSTO WITH HYDRODISTENSION N/A 12/30/2018   Procedure: CYSTOSCOPY/HYDRODISTENSION WITH MARCAINE;  Surgeon: Bjorn Loser, MD;  Location: [facility] ORS;  Service: Urology;  Laterality: N/A;  . ESOPHAGOGASTRODUODENOSCOPY  (EGD) WITH PROPOFOL N/A 04/15/2019   Procedure: ESOPHAGOGASTRODUODENOSCOPY (EGD) WITH PROPOFOL;  Surgeon: [name], MD;  Location: [place];  Service: Endoscopy;  Laterality: N/A;  sleep apnea  . HERNIA REPAIR     abd  . HERNIA REPAIR     umbilical  . INGUINAL HERNIA REPAIR Bilateral    inguinal  . LAPAROSCOPIC BILATERAL SALPINGO OOPHERECTOMY    . SPINAL FUSION  2002  . TONSILLECTOMY AND ADENOIDECTOMY  1961    There were no vitals filed for this visit.   Subjective Assessment - 06/24/19 1320    Subjective 1) IC related Sx for the past 3 years:   1a) When her bladder is full, it feels like it is on fire and she wakes 3-4 x a night with feeling. Pt does empty her bladder completely each time. Pt strains everytime to get every drop out. Pt has been to many doctors for IC Tx but they have not been helpful for decreasing this  burning sensation bladder. Pt has also tried a medication and it has not showed improvement.   1b)  During her internal pelvic assessment by a provider, pt noticed pain at at a R sided deep area where there is scar tissue. Pain level 8/10 when doctor was pressing on it. Pt is currently not sexually active but in the past, pt  has had painful intercourse for the past 10 years.   1c) urinary frequency has increased since she had this infection. Pt goes to the bathroom before leaving house. Pt avoids public restrooms.   Denied initiating urine.    2) R sided groin pain that started 5-6 years ago which came on gradually.  Pt dealt with it with her gynecologist. CT scan showed 3 hernias with one of them at this spot and the other one was at the belly button and L  abdominal area.  All hernias been repaired in 3 years ago.  The pain did not go away. Pt insisted another CT scan which showed a benign ovarian cyst on the R which was then removed along with L ovary and B fallopian tubes. Pt had already partial vaginal hysterectomy years ago.  The pain was still  there after these cysts. Currently the R sided groin pain only occurs with R sidelying for 20 min before she has to change positions. Pt sleeps on her L side with pillow between knees.  Pt does end up on her back at night.    3) R knee pain since her last fall 1/1/1 21 when she fell onto her R knee and hands in her garage. Pt had a fall on her R knee and hands.  Pt has recovered ok except her R knee.   4) CLBP: Hx of fusion surgery, skiinginjury 2004 caused fractured in the middle of fusion.  CLBP occurs with lifting, standing too long for cooking ( sits on a bench to peel potatoes).  Denied radiating pain.    5) L plantar fascitiis . Pt used to walk with her neighborhood with less than 1 mile but then her R knee and L planat fasciits started hurting.      Pertinent History Gynecological - 4 vaginal deliveries with epiostomies everytime. Falls onto tailbone down a steps for "half a dozen times" with the last being 15 years ago.   GI: Recent colonscopy included removal of benign polyps and was explained that intestines were looped and twisted and it was difficult to remove the scope. Takes Linzess medicaiton with daily bowel movements. No straining with bowel movements. Daily fluid intake: 64 fl oz of water, 1 cup of tea.   Pt has OSA and has been using her CPAP until she has had a bacteria infection 2 weeks ago and ended up in the hospital where she slept without it and she felt she slept better without it.   Pt was in the hospital for 5 days ( 06/03/19 - 06/07/19 ) .  Bacterial infection was found in her R kidney, blood, and also a UTI was present.  Pt has been taking oral antibiotics and on her 4th day, pt saw her PCP for f/u.  Her WBC/ platelets was higher than in the hospital and she was put on antibiotics again. Labs came back normal since 2nd round of antibiotics. Her last dose will be tonight and will be seeing Dr. Ellard Artis ( urologist) this Thursday.              [facility] PT Assessment -  06/24/19 1321      Assessment   Medical Diagnosis  high pelvic floor tone     Referring Provider (PT)  Carlean Purl, NP      Precautions   Precautions  None      Restrictions   Weight Bearing Restrictions  No      Balance Screen   Has the patient  fallen in the past 6 months  Yes      [place] residence      Prior Function   Level of Independence  Independent      Observation/Other Assessments   Scoliosis  L shoulder/ iliac crest higher than R, L thoracic posterior curve,      Palpation   Spinal mobility  Tightness at L T3-5 intercostals, medial scapular mm/ interspinals,    SI assessment   R iliacc rest lowered     Palpation comment          Gait: decreased R stance, minimize thoracic rotation, SOB from clinic waiting room to office, requried standing rest breaks.          Objective measurements completed on examination: See above findings.      [place] [facility] PT Treatment/Exercise - 06/24/19 1321      Ambulation/Gait   Gait Comments  0.99 m/s       Therapeutic Activites    Other Therapeutic Activities  explained anatomy/ physiology explanations, POC for addressing pt's multiple Sx                   PT Long Term Goals - 06/18/19 1429      PT LONG TERM GOAL #1   Title  Pt will report on FOTO, less leakage  "How much urine usually leaks before you can get to the toilet?" in order to improve ADLs    Baseline  Enough to make underpants / pads wet    Time  5    Period  Weeks    Status  New    Target Date  07/23/19      PT LONG TERM GOAL #2   Title  Pt 's FOTO for Constipation will improve from 60 pts to > 64 pts in order to restore GI and pelvic function    Time  10    Period  Weeks    Status  New    Target Date  08/27/19      PT LONG TERM GOAL #3   Title  Pt will demo equal alignment of pelvis, less spinal deviations with compliance to scoliosis specific HEP in order to optimize IAP system for continence and  bowel elimination and less pelvic pain    Time  5    Period  Weeks    Status  New    Target Date  07/23/19      PT LONG TERM GOAL #4   Title  Pt will demo decreased abdominal scar adhesions to progress to deep core exercises and improve motility    Time  8    Period  Weeks    Status  New    Target Date  08/13/19      PT LONG TERM GOAL #5   Title  Pt will demo IND with body mechanics to minimize straining pelvic floor and spine    Time  6    Period  Weeks    Status  New    Target Date  07/30/19      PT LONG TERM GOAL #6   Title  Pt will report decreased pain with plantar fascittis by 50% in order to return to walking with walking group in her neighborhood    Time  4    Period  Weeks    Status  New    Target Date  07/16/19      PT LONG TERM GOAL #  7   Title  Pt will be able to report decreased R groin pain with sidelying to sleep for > 1 hour in this position    Time  8    Period  Weeks    Status  New    Target Date  08/13/19      PT LONG TERM GOAL #8   Title  Pt will report decreased R knee pain by 75% ( < 1-2/10 pain)  in order to walk > 1.0 m/s gait speed    Time  10    Period  Weeks    Status  New    Target Date  08/27/19      PT LONG TERM GOAL  #9   TITLE  Pt will increase gait speed >1.0 m/s without SOB and to be able to walk 6MWT of  feet without reast breaks in order to return to walking for aerobic fitness    Time  10    Period  Weeks    Status  New    Target Date  08/27/19      PT LONG TERM GOAL  #10   TITLE  Pt will be compliant with wearing her CPAP machine daily in order to minimize risk factors associated with noncompliance to CPAP    Time  2    Period  Weeks    Status  New    Target Date  07/02/19             Plan - 06/24/19 1325    Clinical Impression Statement  Pt is a 79 yo female who presents with chronic conditions related to pelvic floor function:   Difficulty with eliminating urine, burning sensation with full bladder, nocturia,  urinary frequency, pelvic pain with pelvic assessments and sexual intercourse. Pt also presents with CLBP, L plantar fasciitis, and R knee pain. These deficits impact her ability with walking, hiking, R sidelying, standing for long periods for cooking, and experience pelvic assessments and sexual intercourse.    Clinical assessment showed leg length difference ( shorter leg on R), pelvic obliquities, thoracolumbar scoliosis, and gait deviations, slowed gait speed with SOB. These impact her intraabdominal pressure system and pelvic floor functions. Provided education with anatomy/physiology and regional interdependent approach to her POC to address scoliosis which in turn will help with R knee and L plantar fasciitis and CLBP issues.  Educated to pt that pelvic functions and gait will improve after scoliosis, leg length difference are addressed. Explained plan to address pelvic misalignment with shoe lift at next session and asked pt to return wearing tennis shoes and not sandals. Pt voiced understanding. Educated ot on the importance to be compliant with CPAP use to minimize complications of OSA. Pt voiced understanding.    Pt benefits from skilled PT      Personal Factors and Comorbidities  Comorbidity 3+;Other    Comorbidities  abdominal surgeries, 3 hernia repair, scoliosis, leg length difference, Arthritis, exercise induced asthma,GERD (gastroesophageal reflux disease), Hyperlipidemia, Hypertension, Neuropathy, right leg and foot, Plantar fasciitis, Prediabetes, RLS, Sleep apnea , SOB (shortness of breath)    Examination-Activity Limitations  Continence;Toileting    Stability/Clinical Decision Making  Evolving/Moderate complexity    Rehab Potential  Good    PT Frequency  1x / week   10   PT Duration  Other (comment)   10   PT Treatment/Interventions  Neuromuscular re-education;Gait training;Moist Heat;Therapeutic exercise;Therapeutic activities;Patient/family education;Manual lymph  drainage;Compression bandaging;Energy conservation;Manual techniques;Scar mobilization;Stair training;Functional mobility training;Balance training  Consulted and Agree with Plan of Care  Patient       Patient will benefit from skilled therapeutic intervention in order to improve the following deficits and impairments:  Decreased activity tolerance, Decreased coordination, Decreased knowledge of use of DME, Decreased safety awareness, Decreased strength, Increased fascial restricitons, Postural dysfunction, Pain, Improper body mechanics, Increased muscle spasms, Decreased endurance, Decreased balance, Abnormal gait, Decreased cognition, Decreased knowledge of precautions, Decreased range of motion, Decreased scar mobility, Impaired tone, Decreased mobility, Decreased skin integrity, Difficulty walking  Visit Diagnosis: Other abnormalities of gait and mobility  Unequal leg length  Other idiopathic scoliosis, thoracolumbar region  Chronic pain of right knee  Plantar fasciitis, left     Problem List Patient Active Problem List   Diagnosis Date Noted  . History of vaginal hysterectomy 06/11/2019  . Bacteremia due to Gram-negative bacteria 06/04/2019  . Pyelonephritis of right kidney 06/03/2019  . Hypokalemia 06/03/2019  . Hyponatremia 06/03/2019  . Prediabetes 04/22/2019  . Estrogen deficiency 04/22/2019  . Urinary frequency 04/22/2019  . History of skin cancer 04/22/2019  . Duodenal ulcer   . High-tone pelvic floor dysfunction 04/03/2019  . IC (interstitial cystitis) 04/03/2019  . Neuralgia of both pudendal nerves 04/03/2019  . Pelvic pain in female 04/03/2019  . Osteoporosis 02/12/2019  . Asthma 01/31/2019  . Adverse reaction to influenza vaccine, initial encounter 12/03/2018  . Vitamin D deficiency 12/03/2018  . Restless legs syndrome 11/28/2018  . Small fiber neuropathy 11/28/2018  . RLQ abdominal pain   . Allergic rhinitis 10/08/2018  . Benign essential hypertension  10/08/2018  . Chronic bladder pain 10/08/2018  . Neuropathy 10/08/2018  . Osteoarthritis 10/08/2018  . S/P BSO (bilateral salpingo-oophorectomy) 01/07/2018  . OSA on CPAP 12/29/2016  . Shortness of breath 06/24/2012  . Other and unspecified disc disorder of lumbar region 10/25/2000  . High cholesterol 09/24/1998  . Tinnitus of both ears 09/24/1983    Jerl Mina ,PT, DPT, E-RYT  06/24/2019, 1:31 PM  [place] MAIN [facility] SERVICES [address] [place], [place], [address] Phone: [phone]   Fax:  [phone]  Name: OLANDA BOUGHNER MRN: [id number] Date of Birth: 11/02/1940

## 2019-06-19 ENCOUNTER — Other Ambulatory Visit: Payer: Self-pay | Admitting: Family Medicine

## 2019-06-19 DIAGNOSIS — R7881 Bacteremia: Secondary | ICD-10-CM

## 2019-06-19 DIAGNOSIS — G588 Other specified mononeuropathies: Secondary | ICD-10-CM | POA: Diagnosis not present

## 2019-06-19 DIAGNOSIS — N3010 Interstitial cystitis (chronic) without hematuria: Secondary | ICD-10-CM | POA: Diagnosis not present

## 2019-06-23 ENCOUNTER — Other Ambulatory Visit: Payer: Self-pay

## 2019-06-23 ENCOUNTER — Ambulatory Visit: Payer: Medicare Other | Admitting: Physical Therapy

## 2019-06-23 DIAGNOSIS — M2170 Unequal limb length (acquired), unspecified site: Secondary | ICD-10-CM | POA: Diagnosis not present

## 2019-06-23 DIAGNOSIS — M4125 Other idiopathic scoliosis, thoracolumbar region: Secondary | ICD-10-CM | POA: Diagnosis not present

## 2019-06-23 DIAGNOSIS — M722 Plantar fascial fibromatosis: Secondary | ICD-10-CM

## 2019-06-23 DIAGNOSIS — G8929 Other chronic pain: Secondary | ICD-10-CM

## 2019-06-23 DIAGNOSIS — R2689 Other abnormalities of gait and mobility: Secondary | ICD-10-CM | POA: Diagnosis not present

## 2019-06-23 DIAGNOSIS — M25561 Pain in right knee: Secondary | ICD-10-CM | POA: Diagnosis not present

## 2019-06-23 NOTE — Patient Instructions (Addendum)
Lengthen Back rib by L  shoulder    Lie on R   side , pillow between knees  Pull  arm overhead over mattress, grab the edge of mattress,pull it upward, drawing elbow away from ears  Breathing 15 reps   Increase L posterior rotation of L shoulder  Open book (handout)    Lie on R   side , pillow between knees ,   rotating L, dragging palm from palm across below breast, and turn 3/4 turn, relax shoulder  only this week onto a pillow behind back 15 reps   ANGEL wings on your back  15 reps    _  wear shoe lift in R shoe

## 2019-06-24 ENCOUNTER — Encounter: Payer: Medicare Other | Admitting: Physical Therapy

## 2019-06-24 NOTE — Therapy (Signed)
[place] MAIN [facility] SERVICES [address] [place], [place], [address] Phone: [phone]   Fax:  [phone]  Physical Therapy Treatment  Patient Details  Name: Erin Petersen MRN: [id number] Date of Birth: May 16, 1940 Referring Provider (PT): Carlean Purl, NP   Encounter Date: 06/23/2019  PT End of Session - 06/24/19 1355    Visit Number  2    Number of Visits  10    Date for PT Re-Evaluation  08/27/19   eval 06/17/19   PT Start Time  0800    PT Stop Time  0900    PT Time Calculation (min)  60 min    Equipment Utilized During Treatment  Gait belt    Activity Tolerance  Patient tolerated treatment well    Behavior During Therapy  [facility] for tasks assessed/performed       Past Medical History:  Diagnosis Date  . Arthritis   . Asthma    exercise induced asthma  . Colon polyps   . Dyspnea   . GERD (gastroesophageal reflux disease)   . Hyperlipidemia   . Hypertension   . Neuropathy    right leg and foot  . Plantar fasciitis   . Prediabetes   . RLS (restless legs syndrome)   . Sleep apnea    CPAP  . SOB (shortness of breath)   . Urinary incontinence   . Urinary tract infection   . Wears hearing aid in right ear     Past Surgical History:  Procedure Laterality Date  . ABDOMINAL HYSTERECTOMY  1972  . APPENDECTOMY  1958  . BACK SURGERY     lumbar fusion  . BILATERAL SALPINGOOPHORECTOMY  01/07/2018  . BIOPSY  04/15/2019   Procedure: BIOPSY;  Surgeon: [name], MD;  Location: [place];  Service: Endoscopy;;  . CARDIAC CATHETERIZATION     over 10 yrs ago.  [place], [employer]  . COLONOSCOPY WITH PROPOFOL N/A 11/11/2018   Procedure: COLONOSCOPY WITH PROPOFOL;  Surgeon: [name], MD;  Location: [facility] ENDOSCOPY;  Service: Gastroenterology;  Laterality: N/A;  . CYSTO WITH HYDRODISTENSION N/A 12/30/2018   Procedure: CYSTOSCOPY/HYDRODISTENSION WITH MARCAINE;  Surgeon: Bjorn Loser, MD;   Location: [facility];  Service: Urology;  Laterality: N/A;  . ESOPHAGOGASTRODUODENOSCOPY (EGD) WITH PROPOFOL N/A 04/15/2019   Procedure: ESOPHAGOGASTRODUODENOSCOPY (EGD) WITH PROPOFOL;  Surgeon: [name], MD;  Location: [place];  Service: Endoscopy;  Laterality: N/A;  sleep apnea  . HERNIA REPAIR     abd  . HERNIA REPAIR     umbilical  . INGUINAL HERNIA REPAIR Bilateral    inguinal  . LAPAROSCOPIC BILATERAL SALPINGO OOPHERECTOMY    . SPINAL FUSION  2002  . TONSILLECTOMY AND ADENOIDECTOMY  1961    There were no vitals filed for this visit.  Subjective Assessment - 06/24/19 1355    Subjective  Pt tried the seated marching and was not able to do it for a full 2 mins.  The first time 1.5 min, second 1 min, and the last time half a min.  Pt has returned to using her CPAP machine every night. Pt has gotten up 5 x during the night    Pertinent History  Gynecological - 4 vaginal deliveries with epiostomies everytime. Falls onto tailbone down a steps for "half a dozen times" with the last being 15 years ago.  Within the last 6 months, pt had a fall but not on her tailbone, instead on her R  knee and hands.  Pt has recovered ok except her R knee. Recent colonscopy included removal of benign polyps and was explained that intestines were looped and twisted and it was difficult to remove the scope. Takes Linzess medicaiton with daily bowel movements. No straining with bowel movements. Daily fluid intake: 64 fl oz of water, 1 cup of tea. 5) L plantar fascitiis . Pt used to walk with her neighborhood with less than 1 mile but then her R knee and L planat fasciits started hurting.         [facility] PT Assessment - 06/24/19 1356      Palpation   Spinal mobility   Knee   Tightness at L T3-5 intercostals, medial scapular mm/ interspinals,    Tightness at R peroneal longus. Brevis, tib ant     SI assessment   with shoe lift in R shoe ( iliac crest levelled)       Ambulation/Gait   Gait  Comments  minimal trunk rotation, limited L posterior UE rotation                    [facility] Adult PT Treatment/Exercise - 06/24/19 1356      Therapeutic Activites    Therapeutic Activities  Other Therapeutic Activities    Other Therapeutic Activities  provided shoe lift in R shoe due to leg length shorter       Neuro Re-ed    Neuro Re-ed Details   cued for cervical retraciton, scapular depression  for HEP       Manual Therapy   Manual therapy comments  STM/MWM at problem areas noted in assessment to promote more downward scapular rotation and L thoracic posterior rotation     STM/MWM at peroneal longus / brevis, tib ant  at R leg                  PT Long Term Goals - 06/18/19 1429      PT LONG TERM GOAL #1   Title  Pt will report on FOTO, less leakage  "How much urine usually leaks before you can get to the toilet?" in order to improve ADLs    Baseline  Enough to make underpants / pads wet    Time  5    Period  Weeks    Status  New    Target Date  07/23/19      PT LONG TERM GOAL #2   Title  Pt 's FOTO for Constipation will improve from 60 pts to > 64 pts in order to restore GI and pelvic function    Time  10    Period  Weeks    Status  New    Target Date  08/27/19      PT LONG TERM GOAL #3   Title  Pt will demo equal alignment of pelvis, less spinal deviations with compliance to scoliosis specific HEP in order to optimize IAP system for continence and bowel elimination and less pelvic pain    Time  5    Period  Weeks    Status  New    Target Date  07/23/19      PT LONG TERM GOAL #4   Title  Pt will demo decreased abdominal scar adhesions to progress to deep core exercises and improve motility    Time  8    Period  Weeks    Status  New    Target Date  08/13/19  PT LONG TERM GOAL #5   Title  Pt will demo IND with body mechanics to minimize straining pelvic floor and spine    Time  6    Period  Weeks    Status  New    Target Date   07/30/19      PT LONG TERM GOAL #6   Title  Pt will report decreased pain with plantar fascittis by 50% in order to return to walking with walking group in her neighborhood    Time  4    Period  Weeks    Status  New    Target Date  07/16/19      PT LONG TERM GOAL #7   Title  Pt will be able to report decreased R groin pain with sidelying to sleep for > 1 hour in this position    Time  8    Period  Weeks    Status  New    Target Date  08/13/19      PT LONG TERM GOAL #8   Title  Pt will report decreased R knee pain by 75% ( < 1-2/10 pain)  in order to walk > 1.0 m/s gait speed    Time  10    Period  Weeks    Status  New    Target Date  08/27/19      PT LONG TERM GOAL  #9   TITLE  Pt will increase gait speed >1.0 m/s without SOB and to be able to walk 6MWT of  feet without reast breaks in order to return to walking for aerobic fitness    Time  10    Period  Weeks    Status  New    Target Date  08/27/19      PT LONG TERM GOAL  #10   TITLE  Pt will be compliant with wearing her CPAP machine daily in order to minimize risk factors associated with noncompliance to CPAP    Time  2    Period  Weeks    Status  New    Target Date  07/02/19            Plan - 06/24/19 1355    Clinical Impression Statement  With the shoe lift in R shoe, pt demo'd levelled pelvic alignment. Manual Tx helped to decrease L thoracic limitations. Following Tx, pt demo'd more reciporcal movement of thorax and pelvic girdle in gait along with L rotation of thorax with cuing for arm swings. Pt was provided manual Tx at R leg and kinesiotape to provide more stability at medial knee. Post Tx, pt reported R knee pain decreased from 6/10 to 3/10.  Plan to address scoliosis, lower kinetic chain to help improve walking. Plan to perform pelvic floor assessment ( external assessment) at upcoming sessions.   Pt continues to benefit from skilled PT    Personal Factors and Comorbidities  Comorbidity 3+;Other     Comorbidities  abdominal surgeries, 3 hernia repair, scoliosis, leg length difference, Arthritis, exercise induced asthma,GERD (gastroesophageal reflux disease), Hyperlipidemia, Hypertension, Neuropathy, right leg and foot, Plantar fasciitis, Prediabetes, RLS, Sleep apnea , SOB (shortness of breath)    Examination-Activity Limitations  Continence;Toileting    Stability/Clinical Decision Making  Evolving/Moderate complexity    Rehab Potential  Good    PT Frequency  1x / week   10   PT Duration  Other (comment)   10   PT Treatment/Interventions  Neuromuscular re-education;Gait training;Moist Heat;Therapeutic exercise;Therapeutic activities;Patient/family education;Manual  lymph drainage;Compression bandaging;Energy conservation;Manual techniques;Scar mobilization;Stair training;Functional mobility training;Balance training    Consulted and Agree with Plan of Care  Patient       Patient will benefit from skilled therapeutic intervention in order to improve the following deficits and impairments:  Decreased activity tolerance, Decreased coordination, Decreased knowledge of use of DME, Decreased safety awareness, Decreased strength, Increased fascial restricitons, Postural dysfunction, Pain, Improper body mechanics, Increased muscle spasms, Decreased endurance, Decreased balance, Abnormal gait, Decreased cognition, Decreased knowledge of precautions, Decreased range of motion, Decreased scar mobility, Impaired tone, Decreased mobility, Decreased skin integrity, Difficulty walking  Visit Diagnosis: Unequal leg length  Other abnormalities of gait and mobility  Other idiopathic scoliosis, thoracolumbar region  Chronic pain of right knee  Plantar fasciitis, left     Problem List Patient Active Problem List   Diagnosis Date Noted  . History of vaginal hysterectomy 06/11/2019  . Bacteremia due to Gram-negative bacteria 06/04/2019  . Pyelonephritis of right kidney 06/03/2019  . Hypokalemia  06/03/2019  . Hyponatremia 06/03/2019  . Prediabetes 04/22/2019  . Estrogen deficiency 04/22/2019  . Urinary frequency 04/22/2019  . History of skin cancer 04/22/2019  . Duodenal ulcer   . High-tone pelvic floor dysfunction 04/03/2019  . IC (interstitial cystitis) 04/03/2019  . Neuralgia of both pudendal nerves 04/03/2019  . Pelvic pain in female 04/03/2019  . Osteoporosis 02/12/2019  . Asthma 01/31/2019  . Adverse reaction to influenza vaccine, initial encounter 12/03/2018  . Vitamin D deficiency 12/03/2018  . Restless legs syndrome 11/28/2018  . Small fiber neuropathy 11/28/2018  . RLQ abdominal pain   . Allergic rhinitis 10/08/2018  . Benign essential hypertension 10/08/2018  . Chronic bladder pain 10/08/2018  . Neuropathy 10/08/2018  . Osteoarthritis 10/08/2018  . S/P BSO (bilateral salpingo-oophorectomy) 01/07/2018  . OSA on CPAP 12/29/2016  . Shortness of breath 06/24/2012  . Other and unspecified disc disorder of lumbar region 10/25/2000  . High cholesterol 09/24/1998  . Tinnitus of both ears 09/24/1983    Jerl Mina ,PT, DPT, E-RYT  06/24/2019, 1:59 PM  [place] [facility] MAIN [facility] SERVICES [address] [place], [place], [address] Phone: [phone]   Fax:  [phone]  Name: TEKELA GARGUILO MRN: [id number] Date of Birth: 03-09-1940

## 2019-06-24 NOTE — Addendum Note (Signed)
Addended by: Jerl Mina on: 06/24/2019 01:52 PM   Modules accepted: Orders

## 2019-07-01 ENCOUNTER — Encounter: Payer: Medicare Other | Admitting: Physical Therapy

## 2019-07-07 ENCOUNTER — Other Ambulatory Visit
Admission: RE | Admit: 2019-07-07 | Discharge: 2019-07-07 | Disposition: A | Discharge status: Home / Self Care | Payer: Medicare Other | Source: Ambulatory Visit | Attending: Gastroenterology | Admitting: Gastroenterology

## 2019-07-07 ENCOUNTER — Other Ambulatory Visit (INDEPENDENT_AMBULATORY_CARE_PROVIDER_SITE_OTHER): Payer: Medicare Other

## 2019-07-07 DIAGNOSIS — Z20822 Contact with and (suspected) exposure to covid-19: Secondary | ICD-10-CM | POA: Diagnosis not present

## 2019-07-07 DIAGNOSIS — R7881 Bacteremia: Secondary | ICD-10-CM | POA: Diagnosis not present

## 2019-07-07 DIAGNOSIS — Z01812 Encounter for preprocedural laboratory examination: Secondary | ICD-10-CM | POA: Diagnosis not present

## 2019-07-07 LAB — CBC WITH DIFFERENTIAL/PLATELET
Basophils Absolute: 0 10*3/uL (ref 0.0–0.1)
Basophils Relative: 0.6 % (ref 0.0–3.0)
Eosinophils Absolute: 0.2 10*3/uL (ref 0.0–0.7)
Eosinophils Relative: 1.8 % (ref 0.0–5.0)
HCT: 36.8 % (ref 36.0–46.0)
Hemoglobin: 11.9 g/dL — ABNORMAL LOW (ref 12.0–15.0)
Lymphocytes Relative: 19.7 % (ref 12.0–46.0)
Lymphs Abs: 1.6 10*3/uL (ref 0.7–4.0)
MCHC: 32.3 g/dL (ref 30.0–36.0)
MCV: 82.6 fl (ref 78.0–100.0)
Monocytes Absolute: 0.9 10*3/uL (ref 0.1–1.0)
Monocytes Relative: 11.1 % (ref 3.0–12.0)
Neutro Abs: 5.5 10*3/uL (ref 1.4–7.7)
Neutrophils Relative %: 66.8 % (ref 43.0–77.0)
Platelets: 343 10*3/uL (ref 150.0–400.0)
RBC: 4.46 Mil/uL (ref 3.87–5.11)
RDW: 15.2 % (ref 11.5–15.5)
WBC: 8.2 10*3/uL (ref 4.0–10.5)

## 2019-07-07 LAB — SARS CORONAVIRUS 2 (TAT 6-24 HRS): SARS Coronavirus 2: NEGATIVE

## 2019-07-08 ENCOUNTER — Other Ambulatory Visit: Payer: Self-pay

## 2019-07-08 ENCOUNTER — Encounter: Payer: Self-pay | Admitting: Physical Therapy

## 2019-07-08 ENCOUNTER — Ambulatory Visit: Payer: Medicare Other | Attending: Family Medicine | Admitting: Physical Therapy

## 2019-07-08 ENCOUNTER — Encounter: Payer: Medicare Other | Admitting: Physical Therapy

## 2019-07-08 DIAGNOSIS — M25561 Pain in right knee: Secondary | ICD-10-CM | POA: Insufficient documentation

## 2019-07-08 DIAGNOSIS — M722 Plantar fascial fibromatosis: Secondary | ICD-10-CM | POA: Insufficient documentation

## 2019-07-08 DIAGNOSIS — M2170 Unequal limb length (acquired), unspecified site: Secondary | ICD-10-CM | POA: Insufficient documentation

## 2019-07-08 DIAGNOSIS — G8929 Other chronic pain: Secondary | ICD-10-CM | POA: Diagnosis not present

## 2019-07-08 DIAGNOSIS — R2689 Other abnormalities of gait and mobility: Secondary | ICD-10-CM | POA: Insufficient documentation

## 2019-07-08 DIAGNOSIS — M4125 Other idiopathic scoliosis, thoracolumbar region: Secondary | ICD-10-CM | POA: Insufficient documentation

## 2019-07-08 NOTE — Patient Instructions (Addendum)
Stretches :    6 directions of the neck :  5 reps   Rotation: rotate the neck with chin tucked   Side flexion: like holding old phone with ear ( do not rotate)        chin up and down   ___  [employer], 10 reps dragging elbow down,   ___  During day 3 x day Bear stretch:  at doorway Rotate L with chin tuck , head against door Feel doorway against middle of L shoulder blade  ___  ___

## 2019-07-08 NOTE — Therapy (Signed)
[place] MAIN [facility] SERVICES [address] [place], [place], [address] Phone: [phone]   Fax:  [phone]  Physical Therapy Treatment  Patient Details  Name: Erin Petersen MRN: [id number] Date of Birth: [birth year] Referring Provider (PT): Carlean Purl, NP   Encounter Date: 07/08/2019  PT End of Session - 07/08/19 1458    Visit Number  3    Number of Visits  10    Date for PT Re-Evaluation  08/27/19   eval 06/17/19   PT Start Time  1402    PT Stop Time  1500    PT Time Calculation (min)  58 min    Equipment Utilized During Treatment  Gait belt    Activity Tolerance  Patient tolerated treatment well    Behavior During Therapy  WFL for tasks assessed/performed       Past Medical History:  Diagnosis Date  . Arthritis   . Asthma    exercise induced asthma  . Colon polyps   . Dyspnea   . GERD (gastroesophageal reflux disease)   . Hyperlipidemia   . Hypertension   . Neuropathy    right leg and foot  . Plantar fasciitis   . Prediabetes   . RLS (restless legs syndrome)   . Sleep apnea    CPAP  . SOB (shortness of breath)   . Urinary incontinence   . Urinary tract infection   . Wears hearing aid in right ear     Past Surgical History:  Procedure Laterality Date  . ABDOMINAL HYSTERECTOMY  1972  . APPENDECTOMY  1958  . BACK SURGERY     lumbar fusion  . BILATERAL SALPINGOOPHORECTOMY  01/07/2018  . BIOPSY  04/15/2019   Procedure: BIOPSY;  Surgeon: [name], MD;  Location: [place];  Service: Endoscopy;;  . CARDIAC CATHETERIZATION     over 10 yrs ago.  [place], [employer]  . COLONOSCOPY WITH PROPOFOL N/A 11/11/2018   Procedure: COLONOSCOPY WITH PROPOFOL;  Surgeon: [name], MD;  Location: [facility] ENDOSCOPY;  Service: Gastroenterology;  Laterality: N/A;  . CYSTO WITH HYDRODISTENSION N/A 12/30/2018   Procedure: CYSTOSCOPY/HYDRODISTENSION WITH MARCAINE;  Surgeon: Bjorn Loser, MD;   Location: [facility];  Service: Urology;  Laterality: N/A;  . ESOPHAGOGASTRODUODENOSCOPY (EGD) WITH PROPOFOL N/A 04/15/2019   Procedure: ESOPHAGOGASTRODUODENOSCOPY (EGD) WITH PROPOFOL;  Surgeon: [name], MD;  Location: [place];  Service: Endoscopy;  Laterality: N/A;  sleep apnea  . HERNIA REPAIR     abd  . HERNIA REPAIR     umbilical  . INGUINAL HERNIA REPAIR Bilateral    inguinal  . LAPAROSCOPIC BILATERAL SALPINGO OOPHERECTOMY    . SPINAL FUSION  2002  . TONSILLECTOMY AND ADENOIDECTOMY  1961    There were no vitals filed for this visit.  Subjective Assessment - 07/08/19 1410    Subjective  [name] R knee hurt while on her beach trip. Pt has been doing her exercises 2 x day    Pertinent History  Gynecological - 4 vaginal deliveries with epiostomies everytime. Falls onto tailbone down a steps for "half a dozen times" with the last being 15 years ago.  Within the last 6 months, pt had a fall but not on her tailbone, instead on her R knee and hands.  Pt has recovered ok except her R knee. Recent colonscopy included removal of benign polyps and was explained that intestines were looped and twisted and it was difficult to remove the  scope. Takes Linzess medicaiton with daily bowel movements. No straining with bowel movements. Daily fluid intake: 64 fl oz of water, 1 cup of tea. 5) L plantar fascitiis . Pt used to walk with her neighborhood with less than 1 mile but then her R knee and L planat fasciits started hurting.         [facility] PT Assessment - 07/08/19 1411      Coordination   Fine Motor Movements are Fluid and Coordinated  --   cervical sideflexion w/ ext ,did not retract first     Single Leg Stance   Comments  without UE support: 3 sec on R, 2 sec on L,   with hand on wall: 30 reps B       Palpation   Spinal mobility  tightness at L medial scapula , hypomobile T 2-3 L ( improved post Tx)    L shoulder / thorax more posteriorly rotated                    [facility] Adult PT Treatment/Exercise - 07/08/19 1453      Neuro Re-ed    Neuro Re-ed Details   cued for cervical retraction, sideflexion without ext       Moist Heat Therapy   Number Minutes Moist Heat  5 Minutes    Moist Heat Location  --   neck/ shoulder during instruction of hew HEP     Manual Therapy   Manual therapy comments  STM/MWM L medial scapula , distraction/ medial glide at  T 2-3 L                  PT Long Term Goals - 06/18/19 1429      PT LONG TERM GOAL #1   Title  Pt will report on FOTO, less leakage  "How much urine usually leaks before you can get to the toilet?" in order to improve ADLs    Baseline  Enough to make underpants / pads wet    Time  5    Period  Weeks    Status  New    Target Date  07/23/19      PT LONG TERM GOAL #2   Title  Pt 's FOTO for Constipation will improve from 60 pts to > 64 pts in order to restore GI and pelvic function    Time  10    Period  Weeks    Status  New    Target Date  08/27/19      PT LONG TERM GOAL #3   Title  Pt will demo equal alignment of pelvis, less spinal deviations with compliance to scoliosis specific HEP in order to optimize IAP system for continence and bowel elimination and less pelvic pain    Time  5    Period  Weeks    Status  New    Target Date  07/23/19      PT LONG TERM GOAL #4   Title  Pt will demo decreased abdominal scar adhesions to progress to deep core exercises and improve motility    Time  8    Period  Weeks    Status  New    Target Date  08/13/19      PT LONG TERM GOAL #5   Title  Pt will demo IND with body mechanics to minimize straining pelvic floor and spine    Time  6    Period  Weeks    Status  New    Target Date  07/30/19      PT LONG TERM GOAL #6   Title  Pt will report decreased pain with plantar fascittis by 50% in order to return to walking with walking group in her neighborhood    Time  4    Period  Weeks    Status  New    Target Date   07/16/19      PT LONG TERM GOAL #7   Title  Pt will be able to report decreased R groin pain with sidelying to sleep for > 1 hour in this position    Time  8    Period  Weeks    Status  New    Target Date  08/13/19      PT LONG TERM GOAL #8   Title  Pt will report decreased R knee pain by 75% ( < 1-2/10 pain)  in order to walk > 1.0 m/s gait speed    Time  10    Period  Weeks    Status  New    Target Date  08/27/19      PT LONG TERM GOAL  #9   TITLE  Pt will increase gait speed >1.0 m/s without SOB and to be able to walk 6MWT of  feet without reast breaks in order to return to walking for aerobic fitness    Time  10    Period  Weeks    Status  New    Target Date  08/27/19      PT LONG TERM GOAL  #10   TITLE  Pt will be compliant with wearing her CPAP machine daily in order to minimize risk factors associated with noncompliance to CPAP    Time  2    Period  Weeks    Status  New    Target Date  07/02/19            Plan - 07/08/19 1503    Clinical Impression Statement Pt demo'd improved trunk stability with compliance with deep core exercises. Pt required manual Tx to decreased tightness at L shoulder/ thoracic segments to address L thoracic convex curve. Pt demo'd increased mobility at this area and anticipate that pt's gait will improve with more reciprocal pattern and thus, will improve lower kinetic chain deficits.  Pt continues to  benefit from skilled PT.    Personal Factors and Comorbidities  Comorbidity 3+;Other    Comorbidities  abdominal surgeries, 3 hernia repair, scoliosis, leg length difference, Arthritis, exercise induced asthma,GERD (gastroesophageal reflux disease), Hyperlipidemia, Hypertension, Neuropathy, right leg and foot, Plantar fasciitis, Prediabetes, RLS, Sleep apnea , SOB (shortness of breath)    Examination-Activity Limitations  Continence;Toileting    Stability/Clinical Decision Making  Evolving/Moderate complexity    Rehab Potential  Good    PT  Frequency  1x / week   10   PT Duration  Other (comment)   10   PT Treatment/Interventions  Neuromuscular re-education;Gait training;Moist Heat;Therapeutic exercise;Therapeutic activities;Patient/family education;Manual lymph drainage;Compression bandaging;Energy conservation;Manual techniques;Scar mobilization;Stair training;Functional mobility training;Balance training    Consulted and Agree with Plan of Care  Patient       Patient will benefit from skilled therapeutic intervention in order to improve the following deficits and impairments:  Decreased activity tolerance, Decreased coordination, Decreased knowledge of use of DME, Decreased safety awareness, Decreased strength, Increased fascial restricitons, Postural dysfunction, Pain, Improper body mechanics, Increased muscle spasms, Decreased endurance, Decreased balance, Abnormal gait, Decreased cognition, Decreased knowledge of  precautions, Decreased range of motion, Decreased scar mobility, Impaired tone, Decreased mobility, Decreased skin integrity, Difficulty walking  Visit Diagnosis: Other idiopathic scoliosis, thoracolumbar region     Problem List Patient Active Problem List   Diagnosis Date Noted  . History of vaginal hysterectomy 06/11/2019  . Bacteremia due to Gram-negative bacteria 06/04/2019  . Pyelonephritis of right kidney 06/03/2019  . Hypokalemia 06/03/2019  . Hyponatremia 06/03/2019  . Prediabetes 04/22/2019  . Estrogen deficiency 04/22/2019  . Urinary frequency 04/22/2019  . History of skin cancer 04/22/2019  . Duodenal ulcer   . High-tone pelvic floor dysfunction 04/03/2019  . IC (interstitial cystitis) 04/03/2019  . Neuralgia of both pudendal nerves 04/03/2019  . Pelvic pain in female 04/03/2019  . Osteoporosis 02/12/2019  . Asthma 01/31/2019  . Adverse reaction to influenza vaccine, initial encounter 12/03/2018  . Vitamin D deficiency 12/03/2018  . Restless legs syndrome 11/28/2018  . Small fiber  neuropathy 11/28/2018  . RLQ abdominal pain   . Allergic rhinitis 10/08/2018  . Benign essential hypertension 10/08/2018  . Chronic bladder pain 10/08/2018  . Neuropathy 10/08/2018  . Osteoarthritis 10/08/2018  . S/P BSO (bilateral salpingo-oophorectomy) 01/07/2018  . OSA on CPAP 12/29/2016  . Shortness of breath 06/24/2012  . Other and unspecified disc disorder of lumbar region 10/25/2000  . High cholesterol 09/24/1998  . Tinnitus of both ears 09/24/1983    Jerl Mina ,PT, DPT, E-RYT  07/08/2019, 3:05 PM  [place] MAIN [facility] SERVICES [address] [place], [place], [address] Phone: [phone]   Fax:  [phone]  Name: CYMONE YESKE MRN: [id number] Date of Birth: March 15, 1940

## 2019-07-09 ENCOUNTER — Other Ambulatory Visit: Payer: Self-pay

## 2019-07-09 ENCOUNTER — Encounter
Admission: RE | Disposition: A | Discharge status: Home / Self Care | Payer: Self-pay | Source: Home / Self Care | Attending: Gastroenterology

## 2019-07-09 ENCOUNTER — Encounter: Payer: Self-pay | Admitting: Gastroenterology

## 2019-07-09 ENCOUNTER — Ambulatory Visit: Payer: Medicare Other | Admitting: Anesthesiology

## 2019-07-09 ENCOUNTER — Ambulatory Visit
Admission: RE | Admit: 2019-07-09 | Discharge: 2019-07-09 | Disposition: A | Discharge status: Home / Self Care | Payer: Medicare Other | Attending: Gastroenterology | Admitting: Gastroenterology

## 2019-07-09 DIAGNOSIS — G4730 Sleep apnea, unspecified: Secondary | ICD-10-CM | POA: Insufficient documentation

## 2019-07-09 DIAGNOSIS — Z7982 Long term (current) use of aspirin: Secondary | ICD-10-CM | POA: Diagnosis not present

## 2019-07-09 DIAGNOSIS — K269 Duodenal ulcer, unspecified as acute or chronic, without hemorrhage or perforation: Secondary | ICD-10-CM

## 2019-07-09 DIAGNOSIS — I10 Essential (primary) hypertension: Secondary | ICD-10-CM | POA: Diagnosis not present

## 2019-07-09 DIAGNOSIS — K3189 Other diseases of stomach and duodenum: Secondary | ICD-10-CM | POA: Diagnosis not present

## 2019-07-09 DIAGNOSIS — Z79899 Other long term (current) drug therapy: Secondary | ICD-10-CM | POA: Insufficient documentation

## 2019-07-09 DIAGNOSIS — Z09 Encounter for follow-up examination after completed treatment for conditions other than malignant neoplasm: Secondary | ICD-10-CM | POA: Diagnosis not present

## 2019-07-09 DIAGNOSIS — Z7983 Long term (current) use of bisphosphonates: Secondary | ICD-10-CM | POA: Insufficient documentation

## 2019-07-09 DIAGNOSIS — G4733 Obstructive sleep apnea (adult) (pediatric): Secondary | ICD-10-CM | POA: Diagnosis not present

## 2019-07-09 DIAGNOSIS — G629 Polyneuropathy, unspecified: Secondary | ICD-10-CM | POA: Insufficient documentation

## 2019-07-09 DIAGNOSIS — Z79890 Hormone replacement therapy (postmenopausal): Secondary | ICD-10-CM | POA: Diagnosis not present

## 2019-07-09 DIAGNOSIS — E7800 Pure hypercholesterolemia, unspecified: Secondary | ICD-10-CM | POA: Diagnosis not present

## 2019-07-09 DIAGNOSIS — Z8711 Personal history of peptic ulcer disease: Secondary | ICD-10-CM | POA: Insufficient documentation

## 2019-07-09 DIAGNOSIS — K219 Gastro-esophageal reflux disease without esophagitis: Secondary | ICD-10-CM | POA: Insufficient documentation

## 2019-07-09 DIAGNOSIS — J45990 Exercise induced bronchospasm: Secondary | ICD-10-CM | POA: Insufficient documentation

## 2019-07-09 DIAGNOSIS — E785 Hyperlipidemia, unspecified: Secondary | ICD-10-CM | POA: Insufficient documentation

## 2019-07-09 DIAGNOSIS — G2581 Restless legs syndrome: Secondary | ICD-10-CM | POA: Insufficient documentation

## 2019-07-09 HISTORY — PX: ESOPHAGOGASTRODUODENOSCOPY (EGD) WITH PROPOFOL: SHX5813

## 2019-07-09 SURGERY — ESOPHAGOGASTRODUODENOSCOPY (EGD) WITH PROPOFOL
Anesthesia: General

## 2019-07-09 MED ORDER — PROPOFOL 10 MG/ML IV BOLUS
INTRAVENOUS | Status: DC | PRN
  Administered 2019-07-09: 70 mg via INTRAVENOUS

## 2019-07-09 MED ORDER — LIDOCAINE HCL (CARDIAC) PF 100 MG/5ML IV SOSY
PREFILLED_SYRINGE | INTRAVENOUS | Status: DC | PRN
  Administered 2019-07-09: 50 mg via INTRAVENOUS

## 2019-07-09 MED ORDER — PROPOFOL 500 MG/50ML IV EMUL
INTRAVENOUS | Status: AC
  Filled 2019-07-09: qty 50

## 2019-07-09 MED ORDER — LIDOCAINE HCL (PF) 2 % IJ SOLN
INTRAMUSCULAR | Status: AC
  Filled 2019-07-09: qty 5

## 2019-07-09 MED ORDER — SODIUM CHLORIDE 0.9 % IV SOLN: INTRAVENOUS | Status: DC

## 2019-07-09 MED ORDER — PROPOFOL 500 MG/50ML IV EMUL
INTRAVENOUS | Status: DC | PRN
  Administered 2019-07-09: 150 ug/kg/min via INTRAVENOUS

## 2019-07-09 MED ORDER — LINACLOTIDE 145 MCG PO CAPS
145.0000 ug | ORAL_CAPSULE | Freq: Every day | ORAL | 2 refills | Status: DC
Start: 2019-07-09 — End: 2019-12-01

## 2019-07-09 MED ORDER — OMEPRAZOLE 40 MG PO CPDR: 40.0000 mg | DELAYED_RELEASE_CAPSULE | Freq: Every day | ORAL | 1 refills | Status: DC

## 2019-07-09 NOTE — Anesthesia Preprocedure Evaluation (Signed)
Anesthesia Evaluation  Patient identified by MRN, date of birth, ID band Patient awake    Reviewed: Allergy & Precautions, H&P , NPO status , Patient's Chart, lab work & pertinent test results  History of Anesthesia Complications Negative for: history of anesthetic complications  Airway Mallampati: II  TM Distance: <3 FB Neck ROM: limited    Dental  (+) Chipped   Pulmonary shortness of breath, asthma , sleep apnea and Continuous Positive Airway Pressure Ventilation ,    Pulmonary exam normal        Cardiovascular Exercise Tolerance: Good hypertension, (-) anginaNormal cardiovascular exam     Neuro/Psych  Neuromuscular disease negative psych ROS   GI/Hepatic Neg liver ROS, PUD, GERD  Medicated and Controlled,  Endo/Other  negative endocrine ROS  Renal/GU negative Renal ROS  negative genitourinary   Musculoskeletal  (+) Arthritis ,   Abdominal   Peds  Hematology negative hematology ROS (+)   Anesthesia Other Findings Past Medical History: No date: Arthritis No date: Asthma     Comment:  exercise induced asthma No date: Colon polyps No date: Dyspnea No date: GERD (gastroesophageal reflux disease) No date: Hyperlipidemia No date: Hypertension No date: Neuropathy     Comment:  right leg and foot No date: Plantar fasciitis No date: Prediabetes No date: RLS (restless legs syndrome) No date: Sleep apnea     Comment:  CPAP No date: SOB (shortness of breath) No date: Urinary incontinence No date: Urinary tract infection No date: Wears hearing aid in right ear  Past Surgical History: 1972: ABDOMINAL HYSTERECTOMY 1958: APPENDECTOMY No date: BACK SURGERY     Comment:  lumbar fusion 01/07/2018: BILATERAL SALPINGOOPHORECTOMY 04/15/2019: BIOPSY     Comment:  Procedure: BIOPSY;  Surgeon: [name], MD;                Location: [place];  Service: Endoscopy;; No date: CARDIAC CATHETERIZATION     Comment:  over 10 yrs ago.  [place], [place] 11/11/2018: COLONOSCOPY WITH PROPOFOL; N/A     Comment:  Procedure: COLONOSCOPY WITH PROPOFOL;  Surgeon: [name], MD;  Location: [facility] ENDOSCOPY;  Service:               Gastroenterology;  Laterality: N/A; 12/30/2018: CYSTO WITH HYDRODISTENSION; N/A     Comment:  Procedure: CYSTOSCOPY/HYDRODISTENSION WITH MARCAINE;                Surgeon: Bjorn Loser, MD;  Location: [facility];                Service: Urology;  Laterality: N/A; 04/15/2019: ESOPHAGOGASTRODUODENOSCOPY (EGD) WITH PROPOFOL; N/A     Comment:  Procedure: ESOPHAGOGASTRODUODENOSCOPY (EGD) WITH               PROPOFOL;  Surgeon: [name], MD;  Location:               [place];  Service: Endoscopy;  Laterality:               N/A;  sleep apnea No date: HERNIA REPAIR     Comment:  abd No date: HERNIA REPAIR     Comment:  umbilical No date: INGUINAL HERNIA REPAIR; Bilateral     Comment:  inguinal No date: LAPAROSCOPIC BILATERAL SALPINGO OOPHERECTOMY 2002: SPINAL FUSION 1961: TONSILLECTOMY AND ADENOIDECTOMY  BMI    Body Mass Index: 31.71 kg/m  Reproductive/Obstetrics negative OB ROS                             Anesthesia Physical Anesthesia Plan  ASA: III  Anesthesia Plan: General   Post-op Pain Management:    Induction: Intravenous  PONV Risk Score and Plan: Propofol infusion and TIVA  Airway Management Planned: Natural Airway and Nasal Cannula  Additional Equipment:   Intra-op Plan:   Post-operative Plan:   Informed Consent: I have reviewed the patients History and Physical, chart, labs and discussed the procedure including the risks, benefits and alternatives for the proposed anesthesia with the patient or authorized representative who has indicated his/her understanding and acceptance.     Dental Advisory Given  Plan Discussed with: Anesthesiologist, CRNA and  Surgeon  Anesthesia Plan Comments: (Patient consented for risks of anesthesia including but not limited to:  - adverse reactions to medications - risk of intubation if required - damage to eyes, teeth, lips or other oral mucosa - nerve damage due to positioning  - sore throat or hoarseness - Damage to heart, brain, nerves, lungs or loss of life  Patient voiced understanding.)        Anesthesia Quick Evaluation

## 2019-07-09 NOTE — Anesthesia Procedure Notes (Signed)
Date/Time: 07/09/2019 10:22 AM Performed by: Johnna Acosta, CRNA Pre-anesthesia Checklist: Patient identified, Emergency Drugs available, Suction available, Patient being monitored and Timeout performed Patient Re-evaluated:Patient Re-evaluated prior to induction Oxygen Delivery Method: Nasal cannula Preoxygenation: Pre-oxygenation with 100% oxygen Induction Type: IV induction

## 2019-07-09 NOTE — Transfer of Care (Signed)
Immediate Anesthesia Transfer of Care Note  Patient: Erin Petersen  Procedure(s) Performed: ESOPHAGOGASTRODUODENOSCOPY (EGD) WITH PROPOFOL (N/A )  Patient Location: PACU  Anesthesia Type:General  Level of Consciousness: sedated  Airway & Oxygen Therapy: Patient Spontanous Breathing  Post-op Assessment: Report given to RN and Post -op Vital signs reviewed and stable  Post vital signs: Reviewed and stable  Last Vitals:  Vitals Value Taken Time  BP 132/63 07/09/19 1041  Temp 36.7 C 07/09/19 1039  Pulse 57 07/09/19 1041  Resp 19 07/09/19 1041  SpO2 95 % 07/09/19 1041    Last Pain:  Vitals:   07/09/19 1039  TempSrc: Temporal  PainSc: Asleep         Complications: No apparent anesthesia complications

## 2019-07-09 NOTE — Anesthesia Postprocedure Evaluation (Signed)
Anesthesia Post Note  Patient: Erin Petersen  Procedure(s) Performed: ESOPHAGOGASTRODUODENOSCOPY (EGD) WITH PROPOFOL (N/A )  Patient location during evaluation: Endoscopy Anesthesia Type: General Level of consciousness: awake and alert Pain management: pain level controlled Vital Signs Assessment: post-procedure vital signs reviewed and stable Respiratory status: spontaneous breathing, nonlabored ventilation, respiratory function stable and patient connected to nasal cannula oxygen Cardiovascular status: blood pressure returned to baseline and stable Postop Assessment: no apparent nausea or vomiting Anesthetic complications: no     Last Vitals:  Vitals:   07/09/19 1049 07/09/19 1059  BP: 135/71 (!) 175/79  Pulse:    Resp:    Temp:    SpO2:      Last Pain:  Vitals:   07/09/19 1059  TempSrc:   PainSc: 0-No pain                 Precious Haws Keona Bilyeu

## 2019-07-09 NOTE — Op Note (Addendum)
[facility] Gastroenterology Patient Name: Erin Petersen Procedure Date: 07/09/2019 10:10 AM MRN: [id number] Account #: [id number] Date of Birth: February 23, 1941 Admit Type: Outpatient Age: 79 Room: [facility] ENDO ROOM 4 Gender: Female Note Status: Finalized Procedure:             Upper GI endoscopy Indications:           Follow-up of acute duodenal ulcer Providers:             [name] MD, MD Referring MD:          Elby Beck (Referring MD) Medicines:             Monitored Anesthesia Care Complications:         No immediate complications. Estimated blood loss: None. Procedure:             Pre-Anesthesia Assessment:                        - Prior to the procedure, a History and Physical was                         performed, and patient medications and allergies were                         reviewed. The patient is competent. The risks and                         benefits of the procedure and the sedation options and                         risks were discussed with the patient. All questions                         were answered and informed consent was obtained.                         Patient identification and proposed procedure were                         verified by the physician, the nurse, the                         anesthesiologist, the anesthetist and the technician                         in the pre-procedure area in the procedure room in the                         endoscopy suite. Mental Status Examination: alert and                         oriented. Airway Examination: normal oropharyngeal                         airway and neck mobility. Respiratory Examination:                         clear to auscultation. CV Examination: normal.  Prophylactic Antibiotics: The patient does not require                         prophylactic antibiotics. Prior Anticoagulants: The                         patient has taken no previous  anticoagulant or                         antiplatelet agents. ASA Grade Assessment: III - A                         patient with severe systemic disease. After reviewing                         the risks and benefits, the patient was deemed in                         satisfactory condition to undergo the procedure. The                         anesthesia plan was to use monitored anesthesia care                         (MAC). Immediately prior to administration of                         medications, the patient was re-assessed for adequacy                         to receive sedatives. The heart rate, respiratory                         rate, oxygen saturations, blood pressure, adequacy of                         pulmonary ventilation, and response to care were                         monitored throughout the procedure. The physical                         status of the patient was re-assessed after the                         procedure.                        After obtaining informed consent, the endoscope was                         passed under direct vision. Throughout the procedure,                         the patient's blood pressure, pulse, and oxygen                         saturations were monitored continuously. The Endoscope  was introduced through the mouth, and advanced to the                         second part of duodenum. The upper GI endoscopy was                         accomplished without difficulty. The patient tolerated                         the procedure well. Findings:      A small healed ulcer was found in the duodenal bulb. The scar tissue was       healthy in appearance.      The second portion of the duodenum was normal.      The entire examined stomach was normal.      The cardia and gastric fundus were normal on retroflexion.      The gastroesophageal junction and examined esophagus were normal. Impression:            - Duodenal  scar.                        - Normal second portion of the duodenum.                        - Normal stomach.                        - Normal gastroesophageal junction and esophagus.                        - No specimens collected. Recommendation:        - Discharge patient to home (with escort).                        - Resume previous diet today.                        - Continue present medications.                        - Await pathology results.                        - Use Prilosec (omeprazole) 40 mg PO daily as long as                         you take NSIADs. Procedure Code(s):     --- Professional ---                        [phone], Esophagogastroduodenoscopy, flexible,                         transoral; diagnostic, including collection of                         specimen(s) by brushing or washing, when performed                         (separate procedure) Diagnosis Code(s):     --- Professional ---  K31.89, Other diseases of stomach and duodenum                        K26.3, Acute duodenal ulcer without hemorrhage or                         perforation CPT copyright 2019 American Medical Association. All rights reserved. The codes documented in this report are preliminary and upon coder review may  be revised to meet current compliance requirements. Dr. Ulyess Mort [name] MD, MD 07/09/2019 10:38:36 AM This report has been signed electronically. Number of Addenda: 0 Note Initiated On: 07/09/2019 10:10 AM Estimated Blood Loss:  Estimated blood loss: none.      [facility]

## 2019-07-09 NOTE — H&P (Signed)
[name], MD [address]  [place]  [place], [place] [address]  Main: [phone]  Fax: [phone] Pager: [phone]  Primary Care Physician:  Elby Beck, FNP Primary Gastroenterologist:  Dr. [name]  Pre-Procedure History & Physical: HPI:  Erin Petersen is a 79 y.o. female is here for an endoscopy.   Past Medical History:  Diagnosis Date  . Arthritis   . Asthma    exercise induced asthma  . Colon polyps   . Dyspnea   . GERD (gastroesophageal reflux disease)   . Hyperlipidemia   . Hypertension   . Neuropathy    right leg and foot  . Plantar fasciitis   . Prediabetes   . RLS (restless legs syndrome)   . Sleep apnea    CPAP  . SOB (shortness of breath)   . Urinary incontinence   . Urinary tract infection   . Wears hearing aid in right ear     Past Surgical History:  Procedure Laterality Date  . ABDOMINAL HYSTERECTOMY  1972  . APPENDECTOMY  1958  . BACK SURGERY     lumbar fusion  . BILATERAL SALPINGOOPHORECTOMY  01/07/2018  . BIOPSY  04/15/2019   Procedure: BIOPSY;  Surgeon: [name], MD;  Location: [place];  Service: Endoscopy;;  . CARDIAC CATHETERIZATION     over 10 yrs ago.  [place], [place]  . COLONOSCOPY WITH PROPOFOL N/A 11/11/2018   Procedure: COLONOSCOPY WITH PROPOFOL;  Surgeon: [name], MD;  Location: [facility] ENDOSCOPY;  Service: Gastroenterology;  Laterality: N/A;  . CYSTO WITH HYDRODISTENSION N/A 12/30/2018   Procedure: CYSTOSCOPY/HYDRODISTENSION WITH MARCAINE;  Surgeon: Bjorn Loser, MD;  Location: [facility] OR[facility];  Service: Urology;  Laterality: N/A;  . ESOPHAGOGASTRODUODENOSCOPY (EGD) WITH PROPOFOL N/A 04/15/2019   Procedure: ESOPHAGOGASTRODUODENOSCOPY (EGD) WITH PROPOFOL;  Surgeon: [name], MD;  Location: [place];  Service: Endoscopy;  Laterality: N/A;  sleep apnea  . HERNIA REPAIR     abd  . HERNIA REPAIR     umbilical  . INGUINAL HERNIA REPAIR  Bilateral    inguinal  . LAPAROSCOPIC BILATERAL SALPINGO OOPHERECTOMY    . SPINAL FUSION  2002  . TONSILLECTOMY AND ADENOIDECTOMY  1961    Prior to Admission medications   Medication Sig Start Date End Date Taking? Authorizing Provider  acetaminophen (TYLENOL) 650 MG CR tablet Take 1,300 mg by mouth at bedtime.   Yes [provider]  albuterol (VENTOLIN HFA) 108 (90 Base) MCG/ACT inhaler Inhale 2 puffs into the lungs every 6 (six) hours as needed for wheezing or shortness of breath. 05/18/19  Yes Elby Beck, FNP  alendronate (FOSAMAX) 70 MG tablet Take 70 mg by mouth once a week. Take with a full glass of water on an empty stomach.   Yes [provider]  amLODipine (NORVASC) 5 MG tablet Take 1 tablet (5 mg total) by mouth daily. 04/22/19  Yes Elby Beck, FNP  aspirin 81 MG EC tablet Take 81 mg by mouth at bedtime.    Yes [provider]  Cholecalciferol (VITAMIN D3) 50 MCG (2000 UT) TABS Take 2,000 Units by mouth daily.    Yes [provider]  Cyanocobalamin (VITAMIN B-12) 3000 MCG SUBL Take 3,000 mcg by mouth daily.   Yes [provider]  diclofenac sodium (VOLTAREN) 1 % GEL Apply 1 application topically 2 (two) times daily as needed (pain.).    Yes [provider]  dicyclomine (BENTYL)  10 MG capsule TAKE 1 CAPSULE BY MOUTH AT  BEDTIME 04/29/19  Yes Garon Melander, Tally Due, MD  EPINEPHrine 0.3 mg/0.3 mL IJ SOAJ injection Inject 0.3 mLs (0.3 mg total) into the muscle as needed for anaphylaxis. 04/22/19  Yes Elby Beck, FNP  estradiol (ESTRACE) 0.1 MG/GM vaginal cream Place 1 Applicatorful vaginally 3 (three) times a week. Patient taking differently: Place 1 Applicatorful vaginally at bedtime.  10/09/18 10/09/19 Yes Elby Beck, FNP  fluticasone (FLONASE) 50 MCG/ACT nasal spray Place 2 sprays into both nostrils daily. Patient taking differently: Place 1 spray into both nostrils daily.  04/22/19  Yes Elby Beck, FNP   hydrOXYzine (ATARAX/VISTARIL) 25 MG tablet 1po  one hour before sleep 04/03/19  Yes [provider]  linaclotide (LINZESS) 72 MCG capsule Take 1 capsule (72 mcg total) by mouth daily before breakfast. 06/17/19  Yes Brandee Markin, Tally Due, MD  omeprazole (PRILOSEC) 40 MG capsule Take 1 capsule (40 mg total) by mouth 2 (two) times daily before a meal. 06/17/19 07/17/19 Yes Nieve Rojero, Tally Due, MD  pentosan polysulfate (ELMIRON) 100 MG capsule Take 100 mg by mouth 3 (three) times daily. With meals 04/03/19  Yes [provider]  polyethylene glycol (MIRALAX / GLYCOLAX) 17 g packet Take 1 packet by mouth daily.    Yes [provider]  pregabalin (LYRICA) 150 MG capsule Take 1 capsule (150 mg total) by mouth 2 (two) times daily. 04/29/19  Yes Elby Beck, FNP  rOPINIRole (REQUIP) 0.5 MG tablet Take 1-3 tablets (0.5-1.5 mg total) by mouth See admin instructions. Take 1 tablet (0.5 mg) by mouth in the morning, 1 tablet (0.5 mg) by mouth in the afternoon, & take 3 tablets (1.5 mg) by mouth at bedtime. 04/22/19  Yes Elby Beck, FNP  simvastatin (ZOCOR) 20 MG tablet Take 0.5 tablets (10 mg total) by mouth at bedtime. Patient taking differently: Take 20 mg by mouth at bedtime.  04/22/19  Yes Elby Beck, FNP  DIAZEPAM RE Place 10 mg rectally at bedtime.    [provider]  lidocaine (XYLOCAINE) 2 % jelly Apply 1 application topically as needed (pain.).    [provider]  lidocaine (XYLOCAINE) 5 % ointment  04/03/19   [provider]  traMADol (ULTRAM) 50 MG tablet Take 50 mg by mouth every 6 (six) hours as needed. 05/28/19   [provider]    Allergies as of 06/17/2019 - Review Complete 06/17/2019  Allergen Reaction Noted  . Other Anaphylaxis 12/03/2018  . Ciprofloxacin hcl Rash 12/29/2016  . Sulfa antibiotics Rash 06/10/2012    Family History  Problem Relation Age of Onset  . Alcohol abuse Mother   . Cancer Mother   . Stroke Mother    . Alcohol abuse Father   . Arthritis Sister   . Arthritis Daughter   . COPD Daughter   . Arthritis Paternal Grandmother   . Asthma Paternal Grandmother   . COPD Paternal Grandmother   . Breast cancer Paternal Aunt     Social History   Socioeconomic History  . Marital status: Widowed    Spouse name: Not on file  . Number of children: 3  . Years of education: Not on file  . Highest education level: Bachelor's degree (e.g., BA, AB, BS)  Occupational History  . Not on file  Tobacco Use  . Smoking status: Never Smoker  . Smokeless tobacco: Never Used  Substance and Sexual Activity  . Alcohol use: Not Currently  . Drug  use: Never  . Sexual activity: Not Currently  Other Topics Concern  . Not on file  Social History Narrative   Moved from [place]. 2020, lives near daughter. Retired. Enjoys walking.    Social Determinants of Health   Financial Resource Strain: Low Risk   . Difficulty of Paying Living Expenses: Not hard at all  Food Insecurity: No Food Insecurity  . Worried About Charity fundraiser in the Last Year: Never true  . Ran Out of Food in the Last Year: Never true  Transportation Needs: No Transportation Needs  . Lack of Transportation (Medical): No  . Lack of Transportation (Non-Medical): No  Physical Activity: Inactive  . Days of Exercise per Week: 0 days  . Minutes of Exercise per Session: 0 min  Stress: No Stress Concern Present  . Feeling of Stress : Not at all  Social Connections:   . Frequency of Communication with Friends and Family:   . Frequency of Social Gatherings with Friends and Family:   . Attends Religious Services:   . Active Member of Clubs or Organizations:   . Attends Archivist Meetings:   Marland Kitchen Marital Status:   Intimate Partner Violence: Not At Risk  . Fear of Current or Ex-Partner: No  . Emotionally Abused: No  . Physically Abused: No  . Sexually Abused: No    Review of Systems: See HPI, otherwise negative ROS  Physical  Exam: BP (!) 148/70   Pulse 77   Temp (!) 96 F (35.6 C) (Temporal)   Resp 20   Ht 4' 11"  (1.499 m)   Wt 71.2 kg   LMP  (LMP Unknown)   SpO2 100%   BMI 31.71 kg/m  General:   Alert,  pleasant and cooperative in NAD Head:  Normocephalic and atraumatic. Neck:  Supple; no masses or thyromegaly. Lungs:  Clear throughout to auscultation.    Heart:  Regular rate and rhythm. Abdomen:  Soft, nontender and nondistended. Normal bowel sounds, without guarding, and without rebound.   Neurologic:  Alert and  oriented x4;  grossly normal neurologically.  Impression/Plan: GIFT RUECKERT is here for an endoscopy to be performed for h/o PUD  Risks, benefits, limitations, and alternatives regarding  endoscopy have been reviewed with the patient.  Questions have been answered.  All parties agreeable.   Sherri Sear, MD  07/09/2019, 10:22 AM

## 2019-07-10 ENCOUNTER — Encounter: Payer: Self-pay | Admitting: *Deleted

## 2019-07-10 NOTE — Progress Notes (Signed)
Discussed with the patient her planned procedure, she stated that Dr. Marius Ditch has spoken with her and answered her questions and she is ready to proceed.  Verbal consent obtained.  Taken to the procedure room via stretcher.

## 2019-07-15 ENCOUNTER — Other Ambulatory Visit: Payer: Self-pay

## 2019-07-15 ENCOUNTER — Encounter: Payer: Medicare Other | Admitting: Physical Therapy

## 2019-07-15 ENCOUNTER — Ambulatory Visit: Payer: Medicare Other | Admitting: Physical Therapy

## 2019-07-15 DIAGNOSIS — M2170 Unequal limb length (acquired), unspecified site: Secondary | ICD-10-CM | POA: Diagnosis not present

## 2019-07-15 DIAGNOSIS — G8929 Other chronic pain: Secondary | ICD-10-CM | POA: Diagnosis not present

## 2019-07-15 DIAGNOSIS — M722 Plantar fascial fibromatosis: Secondary | ICD-10-CM | POA: Diagnosis not present

## 2019-07-15 DIAGNOSIS — M4125 Other idiopathic scoliosis, thoracolumbar region: Secondary | ICD-10-CM | POA: Diagnosis not present

## 2019-07-15 DIAGNOSIS — R2689 Other abnormalities of gait and mobility: Secondary | ICD-10-CM | POA: Diagnosis not present

## 2019-07-15 DIAGNOSIS — M25561 Pain in right knee: Secondary | ICD-10-CM | POA: Diagnosis not present

## 2019-07-15 NOTE — Therapy (Signed)
[place] MAIN [facility] SERVICES [address] [place], [place], [address] Phone: [phone]   Fax:  [phone]  Physical Therapy Treatment  Patient Details  Name: Erin Petersen MRN: [id number] Date of Birth: September 25, 1940 Referring Provider (PT): Carlean Purl, NP   Encounter Date: 07/15/2019  PT End of Session - 07/15/19 1528    Visit Number  4    Number of Visits  10    Date for PT Re-Evaluation  08/27/19   eval 06/17/19   PT Start Time  1400    PT Stop Time  1502    PT Time Calculation (min)  62 min    Equipment Utilized During Treatment  Gait belt    Activity Tolerance  Patient tolerated treatment well    Behavior During Therapy  WFL for tasks assessed/performed       Past Medical History:  Diagnosis Date  . Arthritis   . Asthma    exercise induced asthma  . Colon polyps   . Dyspnea   . GERD (gastroesophageal reflux disease)   . Hyperlipidemia   . Hypertension   . Neuropathy    right leg and foot  . Plantar fasciitis   . Prediabetes   . RLS (restless legs syndrome)   . Sleep apnea    CPAP  . SOB (shortness of breath)   . Urinary incontinence   . Urinary tract infection   . Wears hearing aid in right ear     Past Surgical History:  Procedure Laterality Date  . ABDOMINAL HYSTERECTOMY  1972  . APPENDECTOMY  1958  . BACK SURGERY     lumbar fusion  . BILATERAL SALPINGOOPHORECTOMY  01/07/2018  . BIOPSY  04/15/2019   Procedure: BIOPSY;  Surgeon: [name], MD;  Location: [place];  Service: Endoscopy;;  . CARDIAC CATHETERIZATION     over 10 yrs ago.  [place], [employer]  . COLONOSCOPY WITH PROPOFOL N/A 11/11/2018   Procedure: COLONOSCOPY WITH PROPOFOL;  Surgeon: [name], MD;  Location: [facility] ENDOSCOPY;  Service: Gastroenterology;  Laterality: N/A;  . CYSTO WITH HYDRODISTENSION N/A 12/30/2018   Procedure: CYSTOSCOPY/HYDRODISTENSION WITH MARCAINE;  Surgeon: Bjorn Loser, MD;   Location: [facility] ORS;  Service: Urology;  Laterality: N/A;  . ESOPHAGOGASTRODUODENOSCOPY (EGD) WITH PROPOFOL N/A 04/15/2019   Procedure: ESOPHAGOGASTRODUODENOSCOPY (EGD) WITH PROPOFOL;  Surgeon: [name], MD;  Location: [place];  Service: Endoscopy;  Laterality: N/A;  sleep apnea  . ESOPHAGOGASTRODUODENOSCOPY (EGD) WITH PROPOFOL N/A 07/09/2019   Procedure: ESOPHAGOGASTRODUODENOSCOPY (EGD) WITH PROPOFOL;  Surgeon: [name], MD;  Location: [place];  Service: Gastroenterology;  Laterality: N/A;  . HERNIA REPAIR     abd  . HERNIA REPAIR     umbilical  . INGUINAL HERNIA REPAIR Bilateral    inguinal  . LAPAROSCOPIC BILATERAL SALPINGO OOPHERECTOMY    . SPINAL FUSION  2002  . TONSILLECTOMY AND ADENOIDECTOMY  1961    There were no vitals filed for this visit.  Subjective Assessment - 07/15/19 1405    Subjective  Pt reported she is 80% improved in her shoulder by her scoliosis curve after last session. R knee pain had felt better after one session  but it has been hurting . It got real bad across the past 3 days.  Pt is limping because it feels like it might give out.         [facility] PT Assessment - 07/15/19 1449      Observation/Other  Assessments   Observations  bear stretch withheld bc pt reported knee pain today despite cues       Strength   Overall Strength Comments  hip / knee flex 3+/5 R, L 4/5 , B DF/EV 3+/5       Palpation   Spinal mobility  no spinal deviations, levelled shoulders    Palpation comment  tightness at peroneal longus/ brevis R , hamstring                     [facility] Adult PT Treatment/Exercise - 07/15/19 1530      Neuro Re-ed    Neuro Re-ed Details   cued for wider BOS, more mid foot strike, alignment with new HEP to minimize knee pain      Manual Therapy   Manual therapy comments  STM/MWM peroneal longus/ brevis     Kinesiotex  Facilitate Muscle      Kinesiotix   Facilitate Muscle   everters                   PT Long Term Goals - 07/15/19 1446      PT LONG TERM GOAL #1   Title  Pt will report on FOTO, less leakage  "How much urine usually leaks before you can get to the toilet?" in order to improve ADLs    Baseline  Enough to make underpants / pads wet    Time  5    Period  Weeks    Status  On-going      PT LONG TERM GOAL #2   Title  Pt 's FOTO for Constipation will improve from 60 pts to > 64 pts in order to restore GI and pelvic function    Time  10    Period  Weeks    Status  On-going      PT LONG TERM GOAL #3   Title  Pt will demo equal alignment of pelvis, less spinal deviations with compliance to scoliosis specific HEP in order to optimize IAP system for continence and bowel elimination and less pelvic pain    Time  5    Period  Weeks    Status  Achieved      PT LONG TERM GOAL #4   Title  Pt will demo decreased abdominal scar adhesions to progress to deep core exercises and improve motility    Time  8    Period  Weeks    Status  On-going      PT LONG TERM GOAL #5   Title  Pt will demo IND with body mechanics to minimize straining pelvic floor and spine    Time  6    Period  Weeks    Status  On-going      PT LONG TERM GOAL #6   Title  Pt will report decreased pain with plantar fascittis by 50% in order to return to walking with walking group in her neighborhood ( 07/15/19: 40% )    Time  4    Period  Weeks    Status  Partially Met      PT LONG TERM GOAL #7   Title  Pt will be able to report decreased R groin pain with sidelying to sleep for > 1 hour in this position    Time  8    Period  Weeks    Status  On-going      PT LONG TERM GOAL #8   Title  Pt  will report decreased R knee pain by 75% ( < 1-2/10 pain)  in order to walk > 1.0 m/s gait speed    Time  10    Period  Weeks    Status  On-going      PT LONG TERM GOAL  #9   TITLE  Pt will increase gait speed >1.0 m/s without SOB and to be able to walk 6MWT of  feet without reast breaks in  order to return to walking for aerobic fitness    Time  10    Period  Weeks    Status  New      PT LONG TERM GOAL  #10   TITLE  Pt will be compliant with wearing her CPAP machine daily in order to minimize risk factors associated with noncompliance to CPAP    Time  2    Period  Weeks    Status  New            Plan - 07/15/19 1528    Clinical Impression Statement Pt demo'd more equal shoulder height and less spinal deviations today which indicates good carry voer from last session.  Today, focused on R chronic knee pain which pt reported to be at 8/10. Pt demo'd poor propioception with knee during turns and new HEP and had narrow BOS which lead to genu valgus and associated pain with knee. Pt also demo'd increased peroneal longus/ brevis on R, supination, heel strike. Knee pain caused pt to stop and stabilize with UE support on wall. Following Tx that helped to decreased mm tightness and increase propioception / alignment, pt reported kne pain at 4/10. However, as pt walked the hallway, pt demo'd narrow BOS and reported pain again. Pt continued to require cues for wider BOS . Advised pt to use cane which pt has in her car. DPT called volunteer services to escort pt in [facility] to Val[employer] to minimize further walking. Pt sat in [facility] and was escorted at end of session. Pt was advised to contact her orthopedic MD for pain management.  Plan to continue to work lower kinetic chain. Pt continues to benefit from skilled PT   Personal Factors and Comorbidities  Comorbidity 3+;Other    Comorbidities  abdominal surgeries, 3 hernia repair, scoliosis, leg length difference, Arthritis, exercise induced asthma,GERD (gastroesophageal reflux disease), Hyperlipidemia, Hypertension, Neuropathy, right leg and foot, Plantar fasciitis, Prediabetes, RLS, Sleep apnea , SOB (shortness of breath)    Examination-Activity Limitations  Continence;Toileting    Stability/Clinical Decision Making  Evolving/Moderate complexity     Rehab Potential  Good    PT Frequency  1x / week   10   PT Duration  Other (comment)   10   PT Treatment/Interventions  Neuromuscular re-education;Gait training;Moist Heat;Therapeutic exercise;Therapeutic activities;Patient/family education;Manual lymph drainage;Compression bandaging;Energy conservation;Manual techniques;Scar mobilization;Stair training;Functional mobility training;Balance training    Consulted and Agree with Plan of Care  Patient       Patient will benefit from skilled therapeutic intervention in order to improve the following deficits and impairments:  Decreased activity tolerance, Decreased coordination, Decreased knowledge of use of DME, Decreased safety awareness, Decreased strength, Increased fascial restricitons, Postural dysfunction, Pain, Improper body mechanics, Increased muscle spasms, Decreased endurance, Decreased balance, Abnormal gait, Decreased cognition, Decreased knowledge of precautions, Decreased range of motion, Decreased scar mobility, Impaired tone, Decreased mobility, Decreased skin integrity, Difficulty walking  Visit Diagnosis: Other idiopathic scoliosis, thoracolumbar region     Problem List Patient Active Problem List   Diagnosis Date Noted  .  History of vaginal hysterectomy 06/11/2019  . Bacteremia due to Gram-negative bacteria 06/04/2019  . Pyelonephritis of right kidney 06/03/2019  . Hypokalemia 06/03/2019  . Hyponatremia 06/03/2019  . Prediabetes 04/22/2019  . Estrogen deficiency 04/22/2019  . Urinary frequency 04/22/2019  . History of skin cancer 04/22/2019  . Duodenal ulcer   . High-tone pelvic floor dysfunction 04/03/2019  . IC (interstitial cystitis) 04/03/2019  . Neuralgia of both pudendal nerves 04/03/2019  . Pelvic pain in female 04/03/2019  . Osteoporosis 02/12/2019  . Asthma 01/31/2019  . Adverse reaction to influenza vaccine, initial encounter 12/03/2018  . Vitamin D deficiency 12/03/2018  . Restless legs syndrome  11/28/2018  . Small fiber neuropathy 11/28/2018  . RLQ abdominal pain   . Allergic rhinitis 10/08/2018  . Benign essential hypertension 10/08/2018  . Chronic bladder pain 10/08/2018  . Neuropathy 10/08/2018  . Osteoarthritis 10/08/2018  . S/P BSO (bilateral salpingo-oophorectomy) 01/07/2018  . OSA on CPAP 12/29/2016  . Shortness of breath 06/24/2012  . Other and unspecified disc disorder of lumbar region 10/25/2000  . High cholesterol 09/24/1998  . Tinnitus of both ears 09/24/1983    Jerl Mina ,PT, DPT, E-RYT  07/15/2019, 3:38 PM  [place] MAIN [facility] SERVICES [address] [place], [place], [address] Phone: [phone]   Fax:  [phone]  Name: Erin Petersen MRN: [id number] Date of Birth: 11/04/1940

## 2019-07-15 NOTE — Patient Instructions (Addendum)
Ankle strengthening on L with band band wrapped around outer L side of foot ballmound of L foot pressing onto band , R foot is placed on top of band hip width apart, with the ballmound ,  R hand holds the band 20  reps swinging L pinky toe out to the L  X 3x day  __   ONLY on R :  Loop R foot, spread the toes Lifting R thigh  Press the band down , lower thigh   20 reps x 3 x day   ____ Walk with wider feet under hips  _____ Stop doing bear stretch for now until knees feel better

## 2019-07-16 ENCOUNTER — Other Ambulatory Visit: Payer: Self-pay | Admitting: Orthopedic Surgery

## 2019-07-16 DIAGNOSIS — M1711 Unilateral primary osteoarthritis, right knee: Secondary | ICD-10-CM

## 2019-07-16 DIAGNOSIS — M25561 Pain in right knee: Secondary | ICD-10-CM | POA: Diagnosis not present

## 2019-07-19 ENCOUNTER — Ambulatory Visit
Admission: RE | Admit: 2019-07-19 | Discharge: 2019-07-19 | Disposition: A | Discharge status: Home / Self Care | Payer: Medicare Other | Source: Ambulatory Visit | Attending: Orthopedic Surgery | Admitting: Orthopedic Surgery

## 2019-07-19 DIAGNOSIS — M23221 Derangement of posterior horn of medial meniscus due to old tear or injury, right knee: Secondary | ICD-10-CM | POA: Diagnosis not present

## 2019-07-19 DIAGNOSIS — M1711 Unilateral primary osteoarthritis, right knee: Secondary | ICD-10-CM | POA: Insufficient documentation

## 2019-07-19 DIAGNOSIS — M25461 Effusion, right knee: Secondary | ICD-10-CM | POA: Diagnosis not present

## 2019-07-19 DIAGNOSIS — M25561 Pain in right knee: Secondary | ICD-10-CM | POA: Diagnosis not present

## 2019-07-22 ENCOUNTER — Encounter: Payer: Medicare Other | Admitting: Physical Therapy

## 2019-07-22 ENCOUNTER — Ambulatory Visit: Payer: Medicare Other | Admitting: Physical Therapy

## 2019-07-22 ENCOUNTER — Other Ambulatory Visit: Payer: Self-pay

## 2019-07-22 DIAGNOSIS — M2170 Unequal limb length (acquired), unspecified site: Secondary | ICD-10-CM

## 2019-07-22 DIAGNOSIS — M722 Plantar fascial fibromatosis: Secondary | ICD-10-CM

## 2019-07-22 DIAGNOSIS — M4125 Other idiopathic scoliosis, thoracolumbar region: Secondary | ICD-10-CM

## 2019-07-22 DIAGNOSIS — M25561 Pain in right knee: Secondary | ICD-10-CM | POA: Diagnosis not present

## 2019-07-22 DIAGNOSIS — R2689 Other abnormalities of gait and mobility: Secondary | ICD-10-CM | POA: Diagnosis not present

## 2019-07-22 DIAGNOSIS — G8929 Other chronic pain: Secondary | ICD-10-CM | POA: Diagnosis not present

## 2019-07-22 NOTE — Patient Instructions (Addendum)
Feet care :  Self -feet massage   Handshake : fingers between toes, moving ballmounds/toes back and forth several times while other hand anchors at arch. Do the same at the hind/mid foot.  Heel to toes upward to a letter Big Letter T strokes to spread ballmounds and toes, several times, pinch between webs of toes  Run finger tips along top of foot between long bones "comb between the bones"    Wiggle toes and spread them out when relaxing    ___    Feet slides :   Points of contact at sitting bones  Four points of contact of foot, Heel up, ankle not twist out Lower heel Four points of contact of foot, Slide foot back   Repeated with other foot     ___  Walking with higher knees, arm swings, lower with midfoot and ballmounds , wide toes    ___  Deep core level 1 and 2 *( handout)    ___  Proper body mechanics with getting out of a chair to decrease strain  on back &pelvic floor   Avoid holding your breath when Getting out of the chair:  Scoot to front part of chair chair Heels behind feet, feet are hip width apart, nose over toes  Inhale like you are smelling roses Exhale to stand

## 2019-07-22 NOTE — Therapy (Addendum)
[place] MAIN [facility] SERVICES [address] [place], [place], [address] Phone: [phone]   Fax:  [phone]  Physical Therapy Treatment  Patient Details  Name: Erin Petersen MRN: [id number] Date of Birth: Feb 06, 1941 Referring Provider (PT): Carlean Purl, NP   Encounter Date: 07/22/2019  PT End of Session - 07/22/19 1457    Visit Number  5    Number of Visits  10    Date for PT Re-Evaluation  08/27/19   eval 06/17/19   PT Start Time  1400    PT Stop Time  1457    PT Time Calculation (min)  57 min    Equipment Utilized During Treatment  Gait belt    Activity Tolerance  Patient tolerated treatment well    Behavior During Therapy  [facility] for tasks assessed/performed       Past Medical History:  Diagnosis Date  . Arthritis   . Asthma    exercise induced asthma  . Colon polyps   . Dyspnea   . GERD (gastroesophageal reflux disease)   . Hyperlipidemia   . Hypertension   . Neuropathy    right leg and foot  . Plantar fasciitis   . Prediabetes   . RLS (restless legs syndrome)   . Sleep apnea    CPAP  . SOB (shortness of breath)   . Urinary incontinence   . Urinary tract infection   . Wears hearing aid in right ear     Past Surgical History:  Procedure Laterality Date  . ABDOMINAL HYSTERECTOMY  1972  . APPENDECTOMY  1958  . BACK SURGERY     lumbar fusion  . BILATERAL SALPINGOOPHORECTOMY  01/07/2018  . BIOPSY  04/15/2019   Procedure: BIOPSY;  Surgeon: [name], MD;  Location: [place];  Service: Endoscopy;;  . CARDIAC CATHETERIZATION     over 10 yrs ago.  [place], [employer]  . COLONOSCOPY WITH PROPOFOL N/A 11/11/2018   Procedure: COLONOSCOPY WITH PROPOFOL;  Surgeon: [name], MD;  Location: [facility] ENDOSCOPY;  Service: Gastroenterology;  Laterality: N/A;  . CYSTO WITH HYDRODISTENSION N/A 12/30/2018   Procedure: CYSTOSCOPY/HYDRODISTENSION WITH MARCAINE;  Surgeon: Bjorn Loser, MD;   Location: [facility] ORS;  Service: Urology;  Laterality: N/A;  . ESOPHAGOGASTRODUODENOSCOPY (EGD) WITH PROPOFOL N/A 04/15/2019   Procedure: ESOPHAGOGASTRODUODENOSCOPY (EGD) WITH PROPOFOL;  Surgeon: [name], MD;  Location: [place];  Service: Endoscopy;  Laterality: N/A;  sleep apnea  . ESOPHAGOGASTRODUODENOSCOPY (EGD) WITH PROPOFOL N/A 07/09/2019   Procedure: ESOPHAGOGASTRODUODENOSCOPY (EGD) WITH PROPOFOL;  Surgeon: [name], MD;  Location: [place];  Service: Gastroenterology;  Laterality: N/A;  . HERNIA REPAIR     abd  . HERNIA REPAIR     umbilical  . INGUINAL HERNIA REPAIR Bilateral    inguinal  . LAPAROSCOPIC BILATERAL SALPINGO OOPHERECTOMY    . SPINAL FUSION  2002  . TONSILLECTOMY AND ADENOIDECTOMY  1961    There were no vitals filed for this visit.  Subjective Assessment - 07/22/19 1406    Subjective  Pt reported she saw her orthopedist and had a MRI on her knee.  Her back and plantar fascia are both getting better        [facility] PT Assessment - 07/22/19 1407      Observation/Other Assessments   Observations  shoulder and iliac crest levelled        Sit to Stand   Comments  weight on heels, posterior COM  Strength   Overall Strength Comments  L ankle DF/EV 3+/5, R 4-/5       Palpation   Palpation comment  increased hypmobility at midfoot  PAD/DAB mm, adductor hallucis transverse head B tightness        Ambulation/Gait   Gait Comments  heel striking, short strides                    [facility] Adult PT Treatment/Exercise - 07/22/19 1438      Therapeutic Activites    Other Therapeutic Activities  gait training      Neuro Re-ed    Neuro Re-ed Details   cued for more transverse arch co-activation, les heel streiking, increased hip flexion, cued for deep core strengthening, sit tos tand with more weight in ballmound and less heels        Manual Therapy   Manual therapy comments  STM/ MWM PAD/DAB mm, adductor hallucis  transverse head B   PA/AP mob grade II mdifoot                  PT Long Term Goals - 07/15/19 1446      PT LONG TERM GOAL #1   Title  Pt will report on FOTO, less leakage  "How much urine usually leaks before you can get to the toilet?" in order to improve ADLs    Baseline  Enough to make underpants / pads wet    Time  5    Period  Weeks    Status  On-going      PT LONG TERM GOAL #2   Title  Pt 's FOTO for Constipation will improve from 60 pts to > 64 pts in order to restore GI and pelvic function    Time  10    Period  Weeks    Status  On-going      PT LONG TERM GOAL #3   Title  Pt will demo equal alignment of pelvis, less spinal deviations with compliance to scoliosis specific HEP in order to optimize IAP system for continence and bowel elimination and less pelvic pain    Time  5    Period  Weeks    Status  Achieved      PT LONG TERM GOAL #4   Title  Pt will demo decreased abdominal scar adhesions to progress to deep core exercises and improve motility    Time  8    Period  Weeks    Status  On-going      PT LONG TERM GOAL #5   Title  Pt will demo IND with body mechanics to minimize straining pelvic floor and spine    Time  6    Period  Weeks    Status  On-going      PT LONG TERM GOAL #6   Title  Pt will report decreased pain with plantar fascittis by 50% in order to return to walking with walking group in her neighborhood ( 07/15/19: 40% )    Time  4    Period  Weeks    Status  Partially Met      PT LONG TERM GOAL #7   Title  Pt will be able to report decreased R groin pain with sidelying to sleep for > 1 hour in this position    Time  8    Period  Weeks    Status  On-going      PT LONG TERM GOAL #8  Title  Pt will report decreased R knee pain by 75% ( < 1-2/10 pain)  in order to walk > 1.0 m/s gait speed    Time  10    Period  Weeks    Status  On-going      PT LONG TERM GOAL  #9   TITLE  Pt will increase gait speed >1.0 m/s without SOB and to be  able to walk 6MWT of  feet without reast breaks in order to return to walking for aerobic fitness    Time  10    Period  Weeks    Status  New      PT LONG TERM GOAL  #10   TITLE  Pt will be compliant with wearing her CPAP machine daily in order to minimize risk factors associated with noncompliance to CPAP    Time  2    Period  Weeks    Status  New            Plan - 07/22/19 1457    Clinical Impression Statement Today, pt continues to show levelled shoulders/ iliac crest and no more spinal deviations with shoe lift in the R shoe.  Pt required manual Tx to increase intrinsic feet mm/ midfoot/ hind mobility to minimize heel striking. After Tx,  pt walked 6 minutes with cues for more hip/ knee flexion, transverse arch co-activation with tolerable pain at R knee. This training and today's Tx will continue to help minimize knee pain and plantar fascia pain.   Initiated deep core strengthening today which pt demo'd properly after cues. Anticipate this addition to HEP will help pelvic floor issues and enhance postural stability.   Pt continues to benefit from skilled PT.      Personal Factors and Comorbidities  Comorbidity 3+;Other    Comorbidities  abdominal surgeries, 3 hernia repair, scoliosis, leg length difference, Arthritis, exercise induced asthma,GERD (gastroesophageal reflux disease), Hyperlipidemia, Hypertension, Neuropathy, right leg and foot, Plantar fasciitis, Prediabetes, RLS, Sleep apnea , SOB (shortness of breath)    Examination-Activity Limitations  Continence;Toileting    Stability/Clinical Decision Making  Evolving/Moderate complexity    Rehab Potential  Good    PT Frequency  1x / week   10   PT Duration  Other (comment)   10   PT Treatment/Interventions  Neuromuscular re-education;Gait training;Moist Heat;Therapeutic exercise;Therapeutic activities;Patient/family education;Manual lymph drainage;Compression bandaging;Energy conservation;Manual techniques;Scar  mobilization;Stair training;Functional mobility training;Balance training    Consulted and Agree with Plan of Care  Patient       Patient will benefit from skilled therapeutic intervention in order to improve the following deficits and impairments:  Decreased activity tolerance, Decreased coordination, Decreased knowledge of use of DME, Decreased safety awareness, Decreased strength, Increased fascial restricitons, Postural dysfunction, Pain, Improper body mechanics, Increased muscle spasms, Decreased endurance, Decreased balance, Abnormal gait, Decreased cognition, Decreased knowledge of precautions, Decreased range of motion, Decreased scar mobility, Impaired tone, Decreased mobility, Decreased skin integrity, Difficulty walking  Visit Diagnosis: Other idiopathic scoliosis, thoracolumbar region  Other abnormalities of gait and mobility  Unequal leg length  Chronic pain of right knee  Plantar fasciitis, left     Problem List Patient Active Problem List   Diagnosis Date Noted  . History of vaginal hysterectomy 06/11/2019  . Bacteremia due to Gram-negative bacteria 06/04/2019  . Pyelonephritis of right kidney 06/03/2019  . Hypokalemia 06/03/2019  . Hyponatremia 06/03/2019  . Prediabetes 04/22/2019  . Estrogen deficiency 04/22/2019  . Urinary frequency 04/22/2019  . History of skin cancer  04/22/2019  . Duodenal ulcer   . High-tone pelvic floor dysfunction 04/03/2019  . IC (interstitial cystitis) 04/03/2019  . Neuralgia of both pudendal nerves 04/03/2019  . Pelvic pain in female 04/03/2019  . Osteoporosis 02/12/2019  . Asthma 01/31/2019  . Adverse reaction to influenza vaccine, initial encounter 12/03/2018  . Vitamin D deficiency 12/03/2018  . Restless legs syndrome 11/28/2018  . Small fiber neuropathy 11/28/2018  . RLQ abdominal pain   . Allergic rhinitis 10/08/2018  . Benign essential hypertension 10/08/2018  . Chronic bladder pain 10/08/2018  . Neuropathy 10/08/2018   . Osteoarthritis 10/08/2018  . S/P BSO (bilateral salpingo-oophorectomy) 01/07/2018  . OSA on CPAP 12/29/2016  . Shortness of breath 06/24/2012  . Other and unspecified disc disorder of lumbar region 10/25/2000  . High cholesterol 09/24/1998  . Tinnitus of both ears 09/24/1983    Jerl Mina ,PT, DPT, E-RYT  07/22/2019, 3:00 PM  [place] MAIN [facility] SERVICES [address] [place], [place], [address] Phone: [phone]   Fax:  [phone]  Name: Erin Petersen MRN: [id number] Date of Birth: Mar 17, 1940

## 2019-07-23 ENCOUNTER — Ambulatory Visit: Payer: Medicare Other | Admitting: Psychology

## 2019-07-29 ENCOUNTER — Ambulatory Visit: Payer: Medicare Other | Attending: Family Medicine | Admitting: Physical Therapy

## 2019-07-29 ENCOUNTER — Other Ambulatory Visit: Payer: Self-pay

## 2019-07-29 DIAGNOSIS — R2689 Other abnormalities of gait and mobility: Secondary | ICD-10-CM | POA: Insufficient documentation

## 2019-07-29 DIAGNOSIS — M2170 Unequal limb length (acquired), unspecified site: Secondary | ICD-10-CM | POA: Insufficient documentation

## 2019-07-29 DIAGNOSIS — M4125 Other idiopathic scoliosis, thoracolumbar region: Secondary | ICD-10-CM | POA: Diagnosis not present

## 2019-07-29 DIAGNOSIS — M25561 Pain in right knee: Secondary | ICD-10-CM | POA: Diagnosis not present

## 2019-07-29 DIAGNOSIS — M722 Plantar fascial fibromatosis: Secondary | ICD-10-CM | POA: Insufficient documentation

## 2019-07-29 DIAGNOSIS — G8929 Other chronic pain: Secondary | ICD-10-CM | POA: Diagnosis not present

## 2019-07-29 NOTE — Therapy (Signed)
[place] MAIN [facility] SERVICES [address] [place], [place], [address] Phone: [phone]   Fax:  [phone]  Physical Therapy Treatment  Patient Details  Name: Erin Petersen MRN: [id number] Date of Birth: 1940-05-03 Referring Provider (PT): Carlean Purl, NP   Encounter Date: 07/29/2019  PT End of Session - 07/29/19 1417    Visit Number  6    Number of Visits  10    Date for PT Re-Evaluation  08/27/19   eval 06/17/19   PT Start Time  1404    PT Stop Time  1512    PT Time Calculation (min)  68 min    Equipment Utilized During Treatment  Gait belt    Activity Tolerance  Patient tolerated treatment well    Behavior During Therapy  WFL for tasks assessed/performed       Past Medical History:  Diagnosis Date  . Arthritis   . Asthma    exercise induced asthma  . Colon polyps   . Dyspnea   . GERD (gastroesophageal reflux disease)   . Hyperlipidemia   . Hypertension   . Neuropathy    right leg and foot  . Plantar fasciitis   . Prediabetes   . RLS (restless legs syndrome)   . Sleep apnea    CPAP  . SOB (shortness of breath)   . Urinary incontinence   . Urinary tract infection   . Wears hearing aid in right ear     Past Surgical History:  Procedure Laterality Date  . ABDOMINAL HYSTERECTOMY  1972  . APPENDECTOMY  1958  . BACK SURGERY     lumbar fusion  . BILATERAL SALPINGOOPHORECTOMY  01/07/2018  . BIOPSY  04/15/2019   Procedure: BIOPSY;  Surgeon: [name], MD;  Location: [place];  Service: Endoscopy;;  . CARDIAC CATHETERIZATION     over 10 yrs ago.  [place], [employer]  . COLONOSCOPY WITH PROPOFOL N/A 11/11/2018   Procedure: COLONOSCOPY WITH PROPOFOL;  Surgeon: [name], MD;  Location[facility];  Service: Gastroenterology;  Laterality: N/A;  . CYSTO WITH HYDRODISTENSION N/A 12/30/2018   Procedure: CYSTOSCOPY/HYDRODISTENSION WITH MARCAINE;  Surgeon: Bjorn Loser, MD;   Location: [facility];  Service: Urology;  Laterality: N/A;  . ESOPHAGOGASTRODUODENOSCOPY (EGD) WITH PROPOFOL N/A 04/15/2019   Procedure: ESOPHAGOGASTRODUODENOSCOPY (EGD) WITH PROPOFOL;  Surgeon: [name], MD;  Location: [place];  Service: Endoscopy;  Laterality: N/A;  sleep apnea  . ESOPHAGOGASTRODUODENOSCOPY (EGD) WITH PROPOFOL N/A 07/09/2019   Procedure: ESOPHAGOGASTRODUODENOSCOPY (EGD) WITH PROPOFOL;  Surgeon: [name], MD;  Location: [place];  Service: Gastroenterology;  Laterality: N/A;  . HERNIA REPAIR     abd  . HERNIA REPAIR     umbilical  . INGUINAL HERNIA REPAIR Bilateral    inguinal  . LAPAROSCOPIC BILATERAL SALPINGO OOPHERECTOMY    . SPINAL FUSION  2002  . TONSILLECTOMY AND ADENOIDECTOMY  1961    There were no vitals filed for this visit.  Subjective Assessment - 07/29/19 1409    Subjective  Pt reported her knee felt better until she walked back to the car. When she puts her feet on the floor in the morning , it hurts but not when laying down. The more she walks the more it hurts. Pt consulted her orthopedist who offerred laproscopic surgery and to ice and elevate the knee. Pt is not interested in surgery.  The R low abdomen area remains to be painful for years  when she is R sidelying. Pt is still having pain when her bladder is full. Pt has returned to using her CPAP. Pt stop drinking water after 6pm.  Pt drinks Sleepytime tea at 9pm and goes to bed at 11pm.  Pt has to get up to urinate at every 1.5 hours to 2 hours.         [facility] PT Assessment - 07/29/19 1453      Observation/Other Assessments   Observations  levelled shoulders and pelvis       Single Leg Stance   Comments  with arm reaching outward for stability       Strength   Overall Strength Comments  knee flex/ ext L 4+/5, R 3+/5        Palpation   Spinal mobility  hypomobility at T10-12, intercostals     SI assessment   tightness at lumbar scar       Noted short  breathing,  Limited diaphragmatic expansion              [facility] Adult PT Treatment/Exercise - 07/29/19 1453      Ambulation/Gait   Gait Comments  using cane, upper trap overuse on R, excessive leaning R onto cane which is too high for her height, cued for increased hip flexion      Therapeutic Activites    Other Therapeutic Activities  gait training: attempted to adjust cane height but lowest height was still too high, pt trialed use of hiking poles which showed less shoulder unevenness      Neuro Re-ed    Neuro Re-ed Details   cued for exercise technique       Exercises   Exercises  --   see pt instructions: knee ext/flex strength, thoracic length     Moist Heat Therapy   Number Minutes Moist Heat  5 Minutes    Moist Heat Location  --   back      Manual Therapy   Manual therapy comments  PA mob at T10-13, STM/MWM at intercostalsT10-13 , scar mobilization at lumbar scar                   PT Long Term Goals - 07/15/19 1446      PT LONG TERM GOAL #1   Title  Pt will report on FOTO, less leakage  "How much urine usually leaks before you can get to the toilet?" in order to improve ADLs    Baseline  Enough to make underpants / pads wet    Time  5    Period  Weeks    Status  On-going      PT LONG TERM GOAL #2   Title  Pt 's FOTO for Constipation will improve from 60 pts to > 64 pts in order to restore GI and pelvic function    Time  10    Period  Weeks    Status  On-going      PT LONG TERM GOAL #3   Title  Pt will demo equal alignment of pelvis, less spinal deviations with compliance to scoliosis specific HEP in order to optimize IAP system for continence and bowel elimination and less pelvic pain    Time  5    Period  Weeks    Status  Achieved      PT LONG TERM GOAL #4   Title  Pt will demo decreased abdominal scar adhesions to progress to deep core exercises and improve motility    Time  8    Period  Weeks    Status  On-going      PT LONG TERM  GOAL #5   Title  Pt will demo IND with body mechanics to minimize straining pelvic floor and spine    Time  6    Period  Weeks    Status  On-going      PT LONG TERM GOAL #6   Title  Pt will report decreased pain with plantar fascittis by 50% in order to return to walking with walking group in her neighborhood ( 07/15/19: 40% )    Time  4    Period  Weeks    Status  Partially Met      PT LONG TERM GOAL #7   Title  Pt will be able to report decreased R groin pain with sidelying to sleep for > 1 hour in this position    Time  8    Period  Weeks    Status  On-going      PT LONG TERM GOAL #8   Title  Pt will report decreased R knee pain by 75% ( < 1-2/10 pain)  in order to walk > 1.0 m/s gait speed    Time  10    Period  Weeks    Status  On-going      PT LONG TERM GOAL  #9   TITLE  Pt will increase gait speed >1.0 m/s without SOB and to be able to walk 6MWT of  feet without reast breaks in order to return to walking for aerobic fitness    Time  10    Period  Weeks    Status  New      PT LONG TERM GOAL  #10   TITLE  Pt will be compliant with wearing her CPAP machine daily in order to minimize risk factors associated with noncompliance to CPAP    Time  2    Period  Weeks    Status  New            Plan - 07/29/19 1513    Clinical Impression Statement   Knee focus: Pt showed excessive lean onto cane which was too high for her height to offload on R knee due to pain. Attempted to adjust cane height but lowest height was still too high, pt trialed use of hiking poles which showed less upper trap overuse. Recommended hiking pole use instead of cane to minimize relapse of spinal deviations which has been corrected over the past sessions. Added R knee ext/flex strengthening with theraband and with education to perform in the pool as pt demo'd weakness on the R.   Bladder issues: Assessed thoracic spine which showed less spinal curve deviations. Hypomobility at T10-13 and  intercostals and short breaths are likely related to bladder issues due to innervations of iliohypogastric/ inguinal nerves. Hypomobility at this area is related to the lumbar fusion surgery. Manual Tx addressed this thoracic area and scar restrictions. Pt demo'd increased diaphragmatic expansion post Tx. Plan to address low abdominal/ inguinal, suprapubic area at next session.   Pt continues to benefit from skilled PT        Personal Factors and Comorbidities  Comorbidity 3+;Other    Comorbidities  abdominal surgeries, 3 hernia repair, scoliosis, leg length difference, Arthritis, exercise induced asthma,GERD (gastroesophageal reflux disease), Hyperlipidemia, Hypertension, Neuropathy, right leg and foot, Plantar fasciitis, Prediabetes, RLS, Sleep apnea , SOB (shortness of breath)    Examination-Activity Limitations  Continence;Toileting  Stability/Clinical Decision Making  Evolving/Moderate complexity    Rehab Potential  Good    PT Frequency  1x / week   10   PT Duration  Other (comment)   10   PT Treatment/Interventions  Neuromuscular re-education;Gait training;Moist Heat;Therapeutic exercise;Therapeutic activities;Patient/family education;Manual lymph drainage;Compression bandaging;Energy conservation;Manual techniques;Scar mobilization;Stair training;Functional mobility training;Balance training    Consulted and Agree with Plan of Care  Patient       Patient will benefit from skilled therapeutic intervention in order to improve the following deficits and impairments:  Decreased activity tolerance, Decreased coordination, Decreased knowledge of use of DME, Decreased safety awareness, Decreased strength, Increased fascial restricitons, Postural dysfunction, Pain, Improper body mechanics, Increased muscle spasms, Decreased endurance, Decreased balance, Abnormal gait, Decreased cognition, Decreased knowledge of precautions, Decreased range of motion, Decreased scar mobility, Impaired tone,  Decreased mobility, Decreased skin integrity, Difficulty walking  Visit Diagnosis: Other idiopathic scoliosis, thoracolumbar region  Other abnormalities of gait and mobility  Unequal leg length  Chronic pain of right knee  Plantar fasciitis, left     Problem List Patient Active Problem List   Diagnosis Date Noted  . History of vaginal hysterectomy 06/11/2019  . Bacteremia due to Gram-negative bacteria 06/04/2019  . Pyelonephritis of right kidney 06/03/2019  . Hypokalemia 06/03/2019  . Hyponatremia 06/03/2019  . Prediabetes 04/22/2019  . Estrogen deficiency 04/22/2019  . Urinary frequency 04/22/2019  . History of skin cancer 04/22/2019  . Duodenal ulcer   . High-tone pelvic floor dysfunction 04/03/2019  . IC (interstitial cystitis) 04/03/2019  . Neuralgia of both pudendal nerves 04/03/2019  . Pelvic pain in female 04/03/2019  . Osteoporosis 02/12/2019  . Asthma 01/31/2019  . Adverse reaction to influenza vaccine, initial encounter 12/03/2018  . Vitamin D deficiency 12/03/2018  . Restless legs syndrome 11/28/2018  . Small fiber neuropathy 11/28/2018  . RLQ abdominal pain   . Allergic rhinitis 10/08/2018  . Benign essential hypertension 10/08/2018  . Chronic bladder pain 10/08/2018  . Neuropathy 10/08/2018  . Osteoarthritis 10/08/2018  . S/P BSO (bilateral salpingo-oophorectomy) 01/07/2018  . OSA on CPAP 12/29/2016  . Shortness of breath 06/24/2012  . Other and unspecified disc disorder of lumbar region 10/25/2000  . High cholesterol 09/24/1998  . Tinnitus of both ears 09/24/1983    Jerl Mina ,PT, DPT, E-RYT   07/29/2019, 3:47 PM  [place] MAIN [facility] SERVICES [address] [place], [place], [address] Phone: [phone]   Fax:  [phone]  Name: ODESSIE POLZIN MRN: [id number] Date of Birth: 15-Oct-1940

## 2019-07-29 NOTE — Patient Instructions (Addendum)
  Ardine Eng poses rocking  Toes tucked, shoulders down and back, on forearms , hands shoulder width apart  10 reps  Straight 10 reps with hands to the right  10 reps with hands to the left  * this can be done standing at kitchen counter throughout the day  ___    Thread the needle modified   Table top position, toes tucked under for 6 points of contact, hands shoulder width apart   Wrists under shoulder Slide one forearm under and come back to table top. Repeat on this side for 5 reps,.  Switch   * this can be done standing at kitchen counter throughout the day  ___  Strengthening of R knee  1) lie on back  Green band on ballmounds of R foot,  Keep elbowsand shoulders and head pressed to bed without pillows Knee is at 90 deg above hips, in hale, exhale press band, straightening knees  12 reps    2) seated with Green band on ballmounds of R foot,  Straighten knee  20 reps    The above exercises can be done seated on a step in the pool (without green band, using resistance of water)  ___  Using hiking poles to walk in the community and wear a bookbag to instead of purse  To offload on  R knee

## 2019-07-31 ENCOUNTER — Ambulatory Visit (INDEPENDENT_AMBULATORY_CARE_PROVIDER_SITE_OTHER): Payer: Medicare Other | Admitting: Psychology

## 2019-07-31 DIAGNOSIS — F4322 Adjustment disorder with anxiety: Secondary | ICD-10-CM

## 2019-08-01 DIAGNOSIS — R918 Other nonspecific abnormal finding of lung field: Secondary | ICD-10-CM | POA: Diagnosis not present

## 2019-08-01 DIAGNOSIS — R0602 Shortness of breath: Secondary | ICD-10-CM | POA: Diagnosis not present

## 2019-08-01 DIAGNOSIS — J398 Other specified diseases of upper respiratory tract: Secondary | ICD-10-CM | POA: Diagnosis not present

## 2019-08-01 DIAGNOSIS — J386 Stenosis of larynx: Secondary | ICD-10-CM | POA: Diagnosis not present

## 2019-08-02 ENCOUNTER — Encounter: Payer: Self-pay | Admitting: Gastroenterology

## 2019-08-03 ENCOUNTER — Encounter: Payer: Self-pay | Admitting: Physical Therapy

## 2019-08-05 ENCOUNTER — Ambulatory Visit: Payer: Medicare Other | Admitting: Physical Therapy

## 2019-08-05 ENCOUNTER — Other Ambulatory Visit: Payer: Self-pay

## 2019-08-05 DIAGNOSIS — G8929 Other chronic pain: Secondary | ICD-10-CM | POA: Diagnosis not present

## 2019-08-05 DIAGNOSIS — M4125 Other idiopathic scoliosis, thoracolumbar region: Secondary | ICD-10-CM | POA: Diagnosis not present

## 2019-08-05 DIAGNOSIS — R2689 Other abnormalities of gait and mobility: Secondary | ICD-10-CM

## 2019-08-05 DIAGNOSIS — M25561 Pain in right knee: Secondary | ICD-10-CM | POA: Diagnosis not present

## 2019-08-05 DIAGNOSIS — M722 Plantar fascial fibromatosis: Secondary | ICD-10-CM | POA: Diagnosis not present

## 2019-08-05 DIAGNOSIS — M2170 Unequal limb length (acquired), unspecified site: Secondary | ICD-10-CM

## 2019-08-05 NOTE — Patient Instructions (Addendum)
Water exercises   _backward walking _side step one lap Right, another lap left  _ side step R , minsquat ( knees behind toes, butt back)      Side step L , mini squat   _seated leg circles _seated with h ands on bench, behind hips, scissor legs \ _ standing, hand on wall, kick back, leg circles  10 reps   Relaxation  __   Clam Shell 45 Degrees  Lying with hips and knees bent 45, one pillow between knees and ankles. Lift knee with exhale. Be sure pelvis does not roll backward. Do not arch back. Do 10 times, each leg, 2 times per day.,       Complimentary stretch: Figure-4  Cross _ foot over _ thigh, opposite knee straight  3 breaths  * Keep pelvis levelled with tactile cue with hand under back of hips  * Slide the ankle of the supporting foot out to decrease the angle which can help level the pelvis   Cross thigh over thigh, exhale to hug the thighs in with arms pulling back of thigh, shoulders/ head is relaxed down , Use towel behind thigh is needed.  10 reps

## 2019-08-06 ENCOUNTER — Ambulatory Visit: Payer: Medicare Other | Admitting: Psychology

## 2019-08-06 NOTE — Therapy (Addendum)
[place] MAIN [facility] SERVICES [address] [place], [place], [address] Phone: [phone]   Fax:  [phone]  Physical Therapy Treatment / Discharge Note   Patient Details  Name: Erin Petersen MRN: [id number] Date of Birth: 03-04-1940 Referring Provider (PT): Carlean Purl, NP   Encounter Date: 08/05/2019  PT End of Session - 08/05/19 1428    Visit Number  7    Number of Visits  10    Date for PT Re-Evaluation  08/27/19   eval 06/17/19   PT Start Time  1400    PT Stop Time  1500    PT Time Calculation (min)  60 min    Equipment Utilized During Treatment  Gait belt    Activity Tolerance  Patient tolerated treatment well    Behavior During Therapy  WFL for tasks assessed/performed       Past Medical History:  Diagnosis Date  . Arthritis   . Asthma    exercise induced asthma  . Colon polyps   . Dyspnea   . GERD (gastroesophageal reflux disease)   . Hyperlipidemia   . Hypertension   . Neuropathy    right leg and foot  . Plantar fasciitis   . Prediabetes   . RLS (restless legs syndrome)   . Sleep apnea    CPAP  . SOB (shortness of breath)   . Urinary incontinence   . Urinary tract infection   . Wears hearing aid in right ear     Past Surgical History:  Procedure Laterality Date  . ABDOMINAL HYSTERECTOMY  1972  . APPENDECTOMY  1958  . BACK SURGERY     lumbar fusion  . BILATERAL SALPINGOOPHORECTOMY  01/07/2018  . BIOPSY  04/15/2019   Procedure: BIOPSY;  Surgeon: [name], MD;  Location: [place];  Service: Endoscopy;;  . CARDIAC CATHETERIZATION     over 10 yrs ago.  [place], [employer]  . COLONOSCOPY WITH PROPOFOL N/A 11/11/2018   Procedure: COLONOSCOPY WITH PROPOFOL;  Surgeon: [name], MD;  Location: [facility] ENDOSCOPY;  Service: Gastroenterology;  Laterality: N/A;  . CYSTO WITH HYDRODISTENSION N/A 12/30/2018   Procedure: CYSTOSCOPY/HYDRODISTENSION WITH MARCAINE;  Surgeon: Bjorn Loser, MD;  Location: [facility];  Service: Urology;  Laterality: N/A;  . ESOPHAGOGASTRODUODENOSCOPY (EGD) WITH PROPOFOL N/A 04/15/2019   Procedure: ESOPHAGOGASTRODUODENOSCOPY (EGD) WITH PROPOFOL;  Surgeon: [name], MD;  Location: [place];  Service: Endoscopy;  Laterality: N/A;  sleep apnea  . ESOPHAGOGASTRODUODENOSCOPY (EGD) WITH PROPOFOL N/A 07/09/2019   Procedure: ESOPHAGOGASTRODUODENOSCOPY (EGD) WITH PROPOFOL;  Surgeon: [name], MD;  Location: [place];  Service: Gastroenterology;  Laterality: N/A;  . HERNIA REPAIR     abd  . HERNIA REPAIR     umbilical  . INGUINAL HERNIA REPAIR Bilateral    inguinal  . LAPAROSCOPIC BILATERAL SALPINGO OOPHERECTOMY    . SPINAL FUSION  2002  . TONSILLECTOMY AND ADENOIDECTOMY  1961    There were no vitals filed for this visit.  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 Pt has to stop Pelvic PT due to high cost of copay.  Pt reports her R knee pain is still pain when walking on it. Pt has tried using hte hiking poles  starting this morning         [facility] PT Assessment - 08/06/19 1417      Coordination   Gross Motor Movements are Fluid and Coordinated  --   poor propioception of foot placement      Strength   Overall Strength Comments  hip abd L 2/5                  Pelvic Floor Special Questions - 08/05/19 1429    External Perineal Exam  through clothing with tenderess and tightness at R obt int, anterior triangle B         [facility] Adult PT Treatment/Exercise - 08/06/19 1414      Therapeutic Activites    Other Therapeutic Activities  cued for aquatic exercises to strengthen hip abduction and gluts      Neuro Re-ed    Neuro Re-ed Details   cued for clam shells and minisquat ,m side stepping  and propioception                   PT Long Term Goals - 08/05/19 1410      PT LONG TERM GOAL #1   Title  Pt will report on FOTO, less leakage  "How much urine usually leaks before you can get to the toilet?" in order to improve ADLs.    Baseline  Enough to make underpants / pads wet    Time  5    Period  Weeks    Status  Not Met      PT LONG TERM GOAL #2   Title  Pt 's FOTO for Constipation will improve from 60 pts to > 64 pts in order to restore GI and pelvic function  ( 6/8: 81 pts pt 's Linzess dose was doubled )    Time  10    Period  Weeks    Status  Achieved      PT LONG TERM GOAL #3   Title  Pt will demo equal alignment of pelvis, less spinal deviations with compliance to scoliosis specific HEP in order to optimize IAP system for continence and bowel elimination and less pelvic pain    Time  5    Period  Weeks    Status  Achieved      PT LONG TERM GOAL #4   Title  Pt will demo decreased abdominal scar adhesions to progress to deep core exercises and improve motility    Time  8    Period  Weeks    Status  Achieved      PT LONG TERM GOAL #5   Title  Pt will demo IND with body mechanics to minimize straining pelvic floor and spine    Time  6    Period  Weeks    Status  Achieved      PT LONG TERM GOAL #6   Title  Pt will report decreased pain with plantar fascittis by 50% in order to return to walking with walking group in her neighborhood ( 07/15/19: 40% , 6/ 8: 60% )    Time  4    Period  Weeks    Status  Achieved      PT LONG TERM GOAL #7   Title  Pt will be able to report decreased R groin pain with sidelying to sleep for > 1 hour in this position    Time  8    Period  Weeks    Status  Not  Met      PT LONG TERM GOAL #8   Title  Pt will report decreased R knee pain by 75% ( < 1-2/10 pain)  in order to walk > 1.0 m/s gait speed    Time  10    Period  Weeks    Status  Not Met      PT LONG TERM GOAL  #9   TITLE  Pt will increase gait speed >1.0  m/s without SOB and to be able to walk 6MWT of  feet without rest breaks in order to return to walking for aerobic fitness    Time  10    Period  Weeks    Status  Not Met      PT LONG TERM GOAL  #10   TITLE  Pt will be compliant with wearing her CPAP machine daily in order to minimize risk factors associated with noncompliance to CPAP    Time  2    Period  Weeks    Status  Achieved            Plan - 08/05/19 1428    Clinical Impression Statement Pt has achieved 6/10 goals and was not able to meet her remaining goals  due to finances.  Pt has demo'd equal pelvic girdle, less spinal deviations 2/2 scoliosis and leg length difference, improved gait mechanics, and deep core strength/ coordination.  FOTO score for Constipation has improved from 60 pts to 63 pts, Urinary Problem from 44 pts to 52 pts which indicates improved pelvic function.   Further pelvic floor assessment was planned in upcoming sessions to further address her urinary issues but pt has to stop PT due to finances.   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    While plantar fasciitis has improved with Tx, [name] R knee is still painful but heel lift provided to pt has helped with her leg length difference and gait deviations. Further strengthening at hips will benefit pt and has been provided to pt today to further decrease her knee pain.  Pt was explained exercises to help in the pool to  Continue strengthening her lower kinetic chain and deep core mm.   Pt is self-d/c at this time.     Personal Factors and Comorbidities  Comorbidity 3+;Other    Comorbidities  abdominal surgeries, 3 hernia repair, scoliosis, leg length difference, Arthritis, exercise induced asthma,GERD (gastroesophageal reflux disease), Hyperlipidemia, Hypertension, Neuropathy, right leg and foot,  Plantar fasciitis, Prediabetes, RLS, Sleep apnea , SOB (shortness of breath)    Examination-Activity Limitations  Continence;Toileting    Stability/Clinical Decision Making  Evolving/Moderate complexity    Rehab Potential  Good    PT Frequency  1x / week   10   PT Duration  Other (comment)   10   PT Treatment/Interventions  Neuromuscular re-education;Gait training;Moist Heat;Therapeutic exercise;Therapeutic activities;Patient/family education;Manual lymph drainage;Compression bandaging;Energy conservation;Manual techniques;Scar mobilization;Stair training;Functional mobility training;Balance training    Consulted and Agree with Plan of Care  Patient       Patient will benefit from skilled therapeutic intervention in order to improve the following deficits and impairments:  Decreased activity tolerance, Decreased coordination, Decreased knowledge of use of DME, Decreased safety awareness, Decreased strength, Increased fascial restricitons, Postural dysfunction, Pain, Improper body mechanics, Increased muscle spasms, Decreased endurance, Decreased balance, Abnormal gait, Decreased  cognition, Decreased knowledge of precautions, Decreased range of motion, Decreased scar mobility, Impaired tone, Decreased mobility, Decreased skin integrity, Difficulty walking  Visit Diagnosis: Other idiopathic scoliosis, thoracolumbar region  Other abnormalities of gait and mobility  Unequal leg length  Chronic pain of right knee  Plantar fasciitis, left     Problem List Patient Active Problem List   Diagnosis Date Noted  . History of vaginal hysterectomy 06/11/2019  . Bacteremia due to Gram-negative bacteria 06/04/2019  . Pyelonephritis of right kidney 06/03/2019  . Hypokalemia 06/03/2019  . Hyponatremia 06/03/2019  . Prediabetes 04/22/2019  . Estrogen deficiency 04/22/2019  . Urinary frequency 04/22/2019  . History of skin cancer 04/22/2019  . Duodenal ulcer   . High-tone pelvic floor  dysfunction 04/03/2019  . IC (interstitial cystitis) 04/03/2019  . Neuralgia of both pudendal nerves 04/03/2019  . Pelvic pain in female 04/03/2019  . Osteoporosis 02/12/2019  . Asthma 01/31/2019  . Adverse reaction to influenza vaccine, initial encounter 12/03/2018  . Vitamin D deficiency 12/03/2018  . Restless legs syndrome 11/28/2018  . Small fiber neuropathy 11/28/2018  . RLQ abdominal pain   . Allergic rhinitis 10/08/2018  . Benign essential hypertension 10/08/2018  . Chronic bladder pain 10/08/2018  . Neuropathy 10/08/2018  . Osteoarthritis 10/08/2018  . S/P BSO (bilateral salpingo-oophorectomy) 01/07/2018  . OSA on CPAP 12/29/2016  . Shortness of breath 06/24/2012  . Other and unspecified disc disorder of lumbar region 10/25/2000  . High cholesterol 09/24/1998  . Tinnitus of both ears 09/24/1983    Jerl Mina ,PT, DPT, E-RYT  08/06/2019, 2:18 PM  [place] MAIN [facility] SERVICES [address] [place], [place], [address] Phone: [phone]   Fax:  [phone]  Name: Erin Petersen MRN: [id number] Date of Birth: 1940/04/22

## 2019-08-07 ENCOUNTER — Ambulatory Visit (INDEPENDENT_AMBULATORY_CARE_PROVIDER_SITE_OTHER): Payer: Medicare Other | Admitting: Psychology

## 2019-08-07 DIAGNOSIS — R05 Cough: Secondary | ICD-10-CM | POA: Diagnosis not present

## 2019-08-07 DIAGNOSIS — R061 Stridor: Secondary | ICD-10-CM | POA: Diagnosis not present

## 2019-08-07 DIAGNOSIS — J386 Stenosis of larynx: Secondary | ICD-10-CM | POA: Diagnosis not present

## 2019-08-07 DIAGNOSIS — F4322 Adjustment disorder with anxiety: Secondary | ICD-10-CM

## 2019-08-07 DIAGNOSIS — J383 Other diseases of vocal cords: Secondary | ICD-10-CM | POA: Diagnosis not present

## 2019-08-12 ENCOUNTER — Ambulatory Visit: Payer: Medicare Other | Admitting: Physical Therapy

## 2019-08-19 ENCOUNTER — Ambulatory Visit: Payer: Medicare Other | Admitting: Physical Therapy

## 2019-08-20 ENCOUNTER — Ambulatory Visit: Payer: Medicare Other | Admitting: Psychology

## 2019-08-21 ENCOUNTER — Ambulatory Visit (INDEPENDENT_AMBULATORY_CARE_PROVIDER_SITE_OTHER): Payer: Medicare Other | Admitting: Psychology

## 2019-08-21 ENCOUNTER — Ambulatory Visit: Payer: Medicare Other | Admitting: Dermatology

## 2019-08-21 DIAGNOSIS — F4322 Adjustment disorder with anxiety: Secondary | ICD-10-CM

## 2019-08-28 ENCOUNTER — Encounter: Payer: Medicare Other | Admitting: Physical Therapy

## 2019-09-02 ENCOUNTER — Encounter: Payer: Medicare Other | Admitting: Physical Therapy

## 2019-09-04 ENCOUNTER — Other Ambulatory Visit: Payer: Self-pay | Admitting: Gastroenterology

## 2019-09-04 DIAGNOSIS — G8929 Other chronic pain: Secondary | ICD-10-CM

## 2019-09-11 ENCOUNTER — Encounter: Payer: Medicare Other | Admitting: Physical Therapy

## 2019-09-15 ENCOUNTER — Ambulatory Visit: Payer: Medicare Other | Admitting: Psychology

## 2019-09-16 ENCOUNTER — Encounter: Payer: Medicare Other | Admitting: Physical Therapy

## 2019-09-25 ENCOUNTER — Encounter: Payer: Medicare Other | Admitting: Physical Therapy

## 2019-10-05 DIAGNOSIS — Z20822 Contact with and (suspected) exposure to covid-19: Secondary | ICD-10-CM | POA: Diagnosis not present

## 2019-10-09 DIAGNOSIS — G2581 Restless legs syndrome: Secondary | ICD-10-CM | POA: Diagnosis not present

## 2019-10-09 DIAGNOSIS — Z9989 Dependence on other enabling machines and devices: Secondary | ICD-10-CM | POA: Diagnosis not present

## 2019-10-09 DIAGNOSIS — G4733 Obstructive sleep apnea (adult) (pediatric): Secondary | ICD-10-CM | POA: Diagnosis not present

## 2019-10-09 DIAGNOSIS — G629 Polyneuropathy, unspecified: Secondary | ICD-10-CM | POA: Diagnosis not present

## 2019-10-10 ENCOUNTER — Encounter: Payer: Self-pay | Admitting: Family Medicine

## 2019-10-14 ENCOUNTER — Encounter: Payer: Self-pay | Admitting: Family Medicine

## 2019-10-14 DIAGNOSIS — N3010 Interstitial cystitis (chronic) without hematuria: Secondary | ICD-10-CM | POA: Diagnosis not present

## 2019-10-21 ENCOUNTER — Other Ambulatory Visit: Payer: Self-pay

## 2019-10-21 ENCOUNTER — Ambulatory Visit: Payer: Medicare Other | Admitting: Gastroenterology

## 2019-10-21 ENCOUNTER — Encounter: Payer: Self-pay | Admitting: Gastroenterology

## 2019-10-21 VITALS — BP 136/75 | HR 92 | Temp 97.9°F | Wt 163.1 lb

## 2019-10-21 DIAGNOSIS — D508 Other iron deficiency anemias: Secondary | ICD-10-CM

## 2019-10-21 DIAGNOSIS — R140 Abdominal distension (gaseous): Secondary | ICD-10-CM | POA: Diagnosis not present

## 2019-10-21 DIAGNOSIS — K269 Duodenal ulcer, unspecified as acute or chronic, without hemorrhage or perforation: Secondary | ICD-10-CM | POA: Diagnosis not present

## 2019-10-21 MED ORDER — RIFAXIMIN 550 MG PO TABS: 550.0000 mg | ORAL_TABLET | Freq: Three times a day (TID) | ORAL | 0 refills | Status: AC

## 2019-10-21 NOTE — Progress Notes (Signed)
[name], MD [address]  [place]  [place], [place] [address]  Main: [phone]  Fax: [phone]    Gastroenterology Consultation  Referring Provider:     Elby Beck, FNP Primary Care Physician:  Elby Beck, FNP Primary Gastroenterologist:  Dr. [name] Reason for Consultation: Follow-up of iron deficiency anemia, peptic ulcer disease, chronic constipation        HPI:   Erin Petersen is a 79 y.o. female referred by Dr. Carlean Purl, Dalbert Batman, FNP  for consultation & management of right lower quadrant pain.  Patient reports having approximately 4 years history of right lower quadrant pain that has progressed over years.  She recently moved from [place] to live with her daughter in 08/2018.  Over the last 4 years, she was evaluated by general surgery and was found to have inguinal hernias which were thought to have caused the pain, underwent hernia repair.  The pain persisted, later evaluated by OB/GYN for ovarian cyst and underwent bilateral oophorectomy.  The pain still persisted.  She then saw urology for possible bladder etiology as she was having urinary urgency nocturnally.  She underwent CT abdomen and pelvis in [place] and was found to have thickening of the terminal ileum, mesenteric edema as well as fluid in the cul-de-sac.  Therefore, she is referred to GI for further evaluation and rule out Crohn's.  Patient reports that the pain has become progressive in last 1 year, worse lying on right side, wakes her up from sleep.  Patient has been taking Advil at night in addition to meloxicam.  She also describes fatigue, large joint arthralgias.  She never smoked in her life.  Does not drink alcohol.  The severity of pain has significantly impacted her physical activity Patient denies having exposure to TB.  She started on amitriptyline, meloxicam and hydroxyzine for her lower abdominal pain.  Patient also reports localized patchy area of  burning and tingling in her left lateral upper thigh  Follow-up visit in 12/03/2018  Patient underwent colonoscopy to evaluate for abnormal CT of the terminal ileum.  It was unremarkable including TI biopsies.  There was no evidence of inflammatory bowel disease.  I empirically started her on Bentyl at bedtime which has significantly improved right lower quadrant discomfort.  She does not wake up during sleep due to severe pain.  She has chronic cystitis for which she is currently being followed by urology.  Patient reports that she has been taking Dexilant 60 mg for chronic GERD.  She does report regurgitation of food, known small hiatal hernia.  When she tried to stop Dexilant, she developed severe reflux symptoms in 3 days after stopping the medication. She used to take Nexium for several years which stopped working.  Therefore, switched to [place].  She reports having an upper endoscopy and possibly a pH test about 20 years ago.  She denies dysphagia.  Follow-up visit 04/08/2019 Patient weaned off Dexilant from 30 mg to Pepcid as needed since last visit.  She noticed recurrence of regurgitation as well as severe nausea when she decreased Dexilant to 8m. Her symptoms got worse when she was completely off Dexilant.  She is here to discuss about further management.  Patient also gained about 7 pounds since last visit.  She is also suffering from constipation since increasing gabapentin as well as on diazepam for her interstitial nephritis.  She takes MiraLAX as needed.  She is also dealing with flareup of plantar fascitis which  is limiting her physical activity.  Patient drinks decaffeinated coffee daily.  She does eat chocolate regularly. Patient has followed up with urologist for second opinion for interstitial cystitis at [facility] who started her on [place], hydroxyzine and amitriptyline.  Patient is not taking amitriptyline.  Follow-up visit 06/17/2019 Since last visit,  patient underwent upper endoscopy which revealed clean-based duodenal ulcer.  I started her on omeprazole 40 mg 2 times a day sucralfate.  Earlier in April, patient was admitted to [facility] secondary to pyelonephritis E. coli UTI which has been treated with Keflex and.  She also had 2 days of nonbloody diarrhea.  Stool studies were negative for infectious diarrhea.  Her diarrhea is currently resolved.  Today, patient like to know if she can restart her meloxicam for severe arthritis of her right knee.  She is currently on a right knee brace due to severe pain and unable to take any nonsteroidal anti-inflammatory medication secondary to peptic ulcer disease.  There is no evidence of edema.  She is currently on omeprazole 40 mg 2 times a day for a peptic ulcer disease.  Patient is taking linaclotide 72 MCG daily which is controlling her constipation  Follow-up visit 10/21/2019 Patient reports doing fairly well.  She underwent upper endoscopy which revealed healing of duodenal ulcer.  She is currently taking omeprazole 40 mg twice daily along with meloxicam 15 mg daily.  She reports that [place] is working well for her constipation.  Her main concern today is significant belching which occurs about 30 minutes after she eats, when she bends over.  She takes Premier protein shake for breakfast daily.  She does report her bloating gets worse end of the day and subsides by next day morning.  Her weight has been stable.  She does have history of sleep apnea, she does acknowledge swallowing air when she drinks protein shakes using a straw  She does not smoke cigarettes or drink alcohol regularly  NSAIDs: Advil and meloxicam daily  Antiplts/Anticoagulants/Anti thrombotics: None  GI Procedures: Colonoscopy in 2018, reportedly normal  Colonoscopy 11/11/2018 - The examined portion of the ileum was normal. Biopsied. - Four 2 to 5 mm polyps in the descending colon and in the ascending colon, removed with a cold  snare. Resected and retrieved. - The distal rectum and anal verge are normal on retroflexion view. - Normal mucosa in the entire examined colon.  DIAGNOSIS:  A. TERMINAL ILEUM; COLD BIOPSY:  - ILEAL MUCOSA WITH PROMINENT INTRAMUCOSAL LYMPHOID AGGREGATES.  - NEGATIVE FOR ACTIVE INFLAMMATION, DYSPLASIA, AND MALIGNANCY.   B. COLON POLYP X2, ASCENDING; COLD SNARE:  - TUBULAR ADENOMA, TWO FRAGMENTS.  - NEGATIVE FOR HIGH-GRADE DYSPLASIA AND MALIGNANCY.   C. COLON POLYP X2, DESCENDING; COLD SNARE:  - TUBULAR ADENOMA, ONE FRAGMENT.  - POLYPOID BENIGN COLONIC MUCOSA, ONE FRAGMENT.  - NEGATIVE FOR HIGH-GRADE DYSPLASIA AND MALIGNANCY.  Upper endoscopy 04/15/2019 - Non-bleeding duodenal ulcer with a clean ulcer base ([place] Class III). Biopsied. - Normal second portion of the duodenum. - Erythematous mucosa in the gastric body. Biopsied. - Normal incisura and antrum. Biopsied. - Esophagogastric landmarks identified. - Normal gastroesophageal junction and esophagus.  DIAGNOSIS:  A. DUODENAL ULCER; COLD BIOPSY:  - ENTERIC MUCOSA WITH FEATURES OF PEPTIC DUODENITIS.  - NEGATIVE FOR FEATURES OF CELIAC, DYSPLASIA, AND MALIGNANCY.   B. STOMACH, RANDOM; COLD BIOPSY:  - GASTRIC ANTRAL AND OXYNTIC MUCOSA WITH FEATURES OF REACTIVE  GASTROPATHY AND PPI EFFECT.  - NEGATIVE FOR H. PYLORI, DYSPLASIA,  AND MALIGNANCY.    Past Medical History:  Diagnosis Date  . Arthritis   . Asthma    exercise induced asthma  . Bacteremia due to Gram-negative bacteria 06/04/2019  . Colon polyps   . Duodenal ulcer   . Dyspnea   . GERD (gastroesophageal reflux disease)   . Hyperlipidemia   . Hypertension   . Neuropathy    right leg and foot  . Plantar fasciitis   . Prediabetes   . RLQ abdominal pain   . RLS (restless legs syndrome)   . Sleep apnea    CPAP  . SOB (shortness of breath)   . Urinary incontinence   . Urinary tract infection   . Wears hearing aid in right ear     Past Surgical History:    Procedure Laterality Date  . ABDOMINAL HYSTERECTOMY  1972  . APPENDECTOMY  1958  . BACK SURGERY     lumbar fusion  . BILATERAL SALPINGOOPHORECTOMY  01/07/2018  . BIOPSY  04/15/2019   Procedure: BIOPSY;  Surgeon: [name], MD;  Location: [facility];  Service: Endoscopy;;  . CARDIAC CATHETERIZATION     over 10 yrs ago.  [employer]  . COLONOSCOPY WITH PROPOFOL N/A 11/11/2018   Procedure: COLONOSCOPY WITH PROPOFOL;  Surgeon: [name], MD;  Location: [facility] ENDOSCOPY;  Service: Gastroenterology;  Laterality: N/A;  . CYSTO WITH HYDRODISTENSION N/A 12/30/2018   Procedure: CYSTOSCOPY/HYDRODISTENSION WITH MARCAINE;  Surgeon: Bjorn Loser, MD;  Location: [facility] ORS;  Service: Urology;  Laterality: N/A;  . ESOPHAGOGASTRODUODENOSCOPY (EGD) WITH PROPOFOL N/A 04/15/2019   Procedure: ESOPHAGOGASTRODUODENOSCOPY (EGD) WITH PROPOFOL;  Surgeon: [name], MD;  Location: [facility];  Service: Endoscopy;  Laterality: N/A;  sleep apnea  . ESOPHAGOGASTRODUODENOSCOPY (EGD) WITH PROPOFOL N/A 07/09/2019   Procedure: ESOPHAGOGASTRODUODENOSCOPY (EGD) WITH PROPOFOL;  Surgeon: [name], MD;  Location: [facility];  Service: Gastroenterology;  Laterality: N/A;  . HERNIA REPAIR     abd  . HERNIA REPAIR     umbilical  . INGUINAL HERNIA REPAIR Bilateral    inguinal  . LAPAROSCOPIC BILATERAL SALPINGO OOPHERECTOMY    . SPINAL FUSION  2002  . TONSILLECTOMY AND ADENOIDECTOMY  1961    Current Outpatient Medications:  .  acetaminophen (TYLENOL) 650 MG CR tablet, Take 1,300 mg by mouth at bedtime., Disp: , Rfl:  .  alendronate (FOSAMAX) 70 MG tablet, Take 70 mg by mouth once a week. Take with a full glass of water on an empty stomach., Disp: , Rfl:  .  amitriptyline (ELAVIL) 25 MG tablet, Take 25 mg by mouth at bedtime., Disp: , Rfl:  .  amLODipine (NORVASC) 5 MG tablet, Take 1 tablet (5 mg total) by mouth daily., Disp: 90 tablet, Rfl: 3 .   aspirin 81 MG EC tablet, Take 81 mg by mouth at bedtime. , Disp: , Rfl:  .  Cholecalciferol (VITAMIN D3) 50 MCG (2000 UT) TABS, Take 2,000 Units by mouth daily. , Disp: , Rfl:  .  Cyanocobalamin (VITAMIN B-12) 3000 MCG SUBL, Take 3,000 mcg by mouth daily., Disp: , Rfl:  .  DIAZEPAM RE, Place 10 mg rectally at bedtime., Disp: , Rfl:  .  diclofenac sodium (VOLTAREN) 1 % GEL, Apply 1 application topically 2 (two) times daily as needed (pain.). , Disp: , Rfl:  .  dicyclomine (BENTYL) 10 MG capsule, TAKE 1 CAPSULE BY MOUTH AT  BEDTIME, Disp: 90 capsule, Rfl: 0 .  EPINEPHrine 0.3 mg/0.3 mL IJ  SOAJ injection, Inject 0.3 mLs (0.3 mg total) into the muscle as needed for anaphylaxis., Disp: 1 each, Rfl: 1 .  FLOVENT HFA 44 MCG/ACT inhaler, , Disp: , Rfl:  .  fluticasone (FLONASE) 50 MCG/ACT nasal spray, Place 2 sprays into both nostrils daily. (Patient taking differently: Place 1 spray into both nostrils daily. ), Disp: 16 g, Rfl: 3 .  hydrochlorothiazide (HYDRODIURIL) 25 MG tablet, Take 25 mg by mouth daily., Disp: , Rfl:  .  lidocaine (XYLOCAINE) 2 % jelly, Apply 1 application topically as needed (pain.)., Disp: , Rfl:  .  lidocaine (XYLOCAINE) 5 % ointment, , Disp: , Rfl:  .  linaclotide (LINZESS) 145 MCG CAPS capsule, Take 1 capsule (145 mcg total) by mouth daily before breakfast., Disp: 30 capsule, Rfl: 2 .  meloxicam (MOBIC) 15 MG tablet, Take 15 mg by mouth daily., Disp: , Rfl:  .  omeprazole (PRILOSEC) 40 MG capsule, Take 1 capsule (40 mg total) by mouth daily before breakfast., Disp: 180 capsule, Rfl: 1 .  polyethylene glycol (MIRALAX / GLYCOLAX) 17 g packet, Take 1 packet by mouth daily. , Disp: , Rfl:  .  pregabalin (LYRICA) 150 MG capsule, Take 1 capsule (150 mg total) by mouth 2 (two) times daily., Disp: 20 capsule, Rfl: 0 .  rOPINIRole (REQUIP) 1 MG tablet, Take 1 mg by mouth 3 (three) times daily., Disp: , Rfl:  .  simvastatin (ZOCOR) 20 MG tablet, Take 0.5 tablets (10 mg total) by mouth at  bedtime. (Patient taking differently: Take 20 mg by mouth at bedtime. ), Disp: 90 tablet, Rfl: 3 .  traMADol (ULTRAM) 50 MG tablet, Take 50 mg by mouth every 6 (six) hours as needed., Disp: , Rfl:  .  rifaximin (XIFAXAN) 550 MG TABS tablet, Take 1 tablet (550 mg total) by mouth 3 (three) times daily for 14 days., Disp: 42 tablet, Rfl: 0   Family History  Problem Relation Age of Onset  . Alcohol abuse Mother   . Cancer Mother   . Stroke Mother   . Alcohol abuse Father   . Arthritis Sister   . Arthritis Daughter   . COPD Daughter   . Arthritis Paternal Grandmother   . Asthma Paternal Grandmother   . COPD Paternal Grandmother   . Breast cancer Paternal Aunt      Social History   Tobacco Use  . Smoking status: Never Smoker  . Smokeless tobacco: Never Used  Vaping Use  . Vaping Use: Never used  Substance Use Topics  . Alcohol use: Not Currently  . Drug use: Never    Allergies as of 10/21/2019 - Review Complete 10/21/2019  Allergen Reaction Noted  . Other Anaphylaxis 12/03/2018  . Ciprofloxacin hcl Rash 12/29/2016  . Sulfa antibiotics Rash 06/10/2012    Review of Systems:    All systems reviewed and negative except where noted in HPI.   Physical Exam:  BP 136/75 (BP Location: Left Arm, Patient Position: Sitting, Cuff Size: Normal)   Pulse 92   Temp 97.9 F (36.6 C) (Oral)   Wt 163 lb 2 oz (74 kg)   LMP  (LMP Unknown)   BMI 32.95 kg/m  No LMP recorded (lmp unknown). Patient has had a hysterectomy.  General:   Alert,  Well-developed, well-nourished, pleasant and cooperative in NAD Head:  Normocephalic and atraumatic. Eyes:  Sclera clear, no icterus.   Conjunctiva pink. Ears:  Normal auditory acuity. Nose:  No deformity, discharge, or lesions. Mouth:  No deformity or lesions,oropharynx pink &  moist. Neck:  Supple; no masses or thyromegaly. Lungs:  Respirations even and unlabored.  Clear throughout to auscultation.   No wheezes, crackles, or rhonchi. No acute  distress. Heart:  Regular rate and rhythm; no murmurs, clicks, rubs, or gallops. Abdomen:  Normal bowel sounds. Soft, nontender and moderately distended, tympanic to percussion without masses, hepatosplenomegaly or hernias noted.  No guarding or rebound tenderness.   Rectal: Not performed Msk:  Symmetrical without gross deformities.  Decreased movement in her right lower extremity due to knee osteoarthritis Pulses:  Normal pulses noted. Extremities:  No clubbing or edema.  No cyanosis. Neurologic:  Alert and oriented x3;  grossly normal neurologically. Skin:  Intact without significant lesions or rashes. No jaundice. Psych:  Alert and cooperative. Normal mood and affect.  Imaging Studies: Reviewed  Assessment and Plan:   Erin Petersen is a 79 y.o. female with history of metabolic syndrome, chronic right lower quadrant pain, history of hernia repair, bilateral oophorectomy, interstitial cystitis seen for follow-up of constipation, peptic ulcer disease  Peptic ulcer disease: Resolved No evidence of H. pylori on gastric biopsies Continue omeprazole 40 mg once a day Reiterated to the patient that she has to stay on acid suppressive therapy as long as she is on nonsteroidal anti-inflammatory medication for osteoarthritis  Chronic abdominal bloating Likely component of aerophagia Advised her to stop drinking shakes using a straw Trial of rifaximin 550 mg 3 times daily for 2 weeks if her insurance allows  Chronic right lower quadrant pain Work-up unremarkable from GI standpoint.  Her pain is most likely related to interstitial cystitis Follow-up with urology  Severe iron deficiency, microcytosis without anemia contributing to chronic fatigue and restless legs Treated with fusion plus for 2 months Microcytosis has resolved, hemoglobin has improved Recheck CBC, iron studies today  Chronic constipation: Currently well controlled on Linzess 145 MCG daily Had to increase the dose from 72  MCG Continue the current dose for now  Follow up in 6 to 12 months   [name], MD

## 2019-10-21 NOTE — Telephone Encounter (Signed)
Camille with [employer] requesting status of cpap supplies. Rosendo Gros said looking at order pt does not need machine but does need water chamber, heated tubing,disposable filter, naso mask, naso pillows and head gear. Rosendo Gros said [facility] is in network with pts insurance.

## 2019-10-22 ENCOUNTER — Other Ambulatory Visit: Payer: Self-pay | Admitting: Family Medicine

## 2019-10-22 DIAGNOSIS — K635 Polyp of colon: Secondary | ICD-10-CM | POA: Insufficient documentation

## 2019-10-22 DIAGNOSIS — G4733 Obstructive sleep apnea (adult) (pediatric): Secondary | ICD-10-CM

## 2019-10-22 LAB — IRON,TIBC AND FERRITIN PANEL
Ferritin: 24 ng/mL (ref 15–150)
Iron Saturation: 17 % (ref 15–55)
Iron: 58 ug/dL (ref 27–139)
Total Iron Binding Capacity: 346 ug/dL (ref 250–450)
UIBC: 288 ug/dL (ref 118–369)

## 2019-10-22 LAB — CBC
Hematocrit: 39.4 % (ref 34.0–46.6)
Hemoglobin: 12.5 g/dL (ref 11.1–15.9)
MCH: 24.3 pg — ABNORMAL LOW (ref 26.6–33.0)
MCHC: 31.7 g/dL (ref 31.5–35.7)
MCV: 77 fL — ABNORMAL LOW (ref 79–97)
Platelets: 413 10*3/uL (ref 150–450)
RBC: 5.15 x10E6/uL (ref 3.77–5.28)
RDW: 14.6 % (ref 11.7–15.4)
WBC: 9.9 10*3/uL (ref 3.4–10.8)

## 2019-10-23 ENCOUNTER — Telehealth: Payer: Self-pay

## 2019-10-23 MED ORDER — FUSION PLUS PO CAPS: 1.0000 | ORAL_CAPSULE | Freq: Every day | ORAL | 1 refills | Status: DC

## 2019-10-23 NOTE — Telephone Encounter (Signed)
Got a Fax from pharmacy that patient prescription for Xifaxan needed a prior authorization. Did Prior authorization through cover my meds and sent information to insurance company. Sent last office visit notes also.

## 2019-10-23 NOTE — Telephone Encounter (Signed)
Patient verbalized understanding of results. States she would like the Fusion plus called in to [facility]. Sent medication to the pharmacy

## 2019-10-23 NOTE — Telephone Encounter (Signed)
Called and left a message for call back  

## 2019-10-23 NOTE — Telephone Encounter (Signed)
-----   Message from [name], MD sent at 10/22/2019  4:54 PM EDT ----- Patient's anemia has resolved.  However, she does have mild iron deficiency.  Recommend fusion plus for 1 to 2 months  Erin Petersen

## 2019-10-27 ENCOUNTER — Other Ambulatory Visit: Payer: Self-pay | Admitting: Family Medicine

## 2019-10-27 ENCOUNTER — Telehealth: Payer: Self-pay | Admitting: Family Medicine

## 2019-10-27 DIAGNOSIS — R931 Abnormal findings on diagnostic imaging of heart and coronary circulation: Secondary | ICD-10-CM

## 2019-10-27 DIAGNOSIS — R9431 Abnormal electrocardiogram [ECG] [EKG]: Secondary | ICD-10-CM

## 2019-10-27 NOTE — Telephone Encounter (Signed)
Conversation: Echo cardiogram [facility]) October 27, 2019  Erin Beck, FNP  8:31 AM I agree. I looked to see if you had been seen by Petersen cardiologist while in the hospital, but didn't see Petersen note.  I have put in Petersen referral for you to see Erin Petersen. It can take 1-2 weeks to get Petersen call regarding an appointment.  Warm regards,  Tor Netters, FNP-BC    October 26, 2019 Erin Petersen, Erin Petersen 8:28 PM Thank you so much for all the research.   I think that it is strange that they did not address that in the hospital because they told me that one of the medications that they were giving me could affect my heart. I concluded that it was so minor that they did not think that i needed Petersen cardiologist. However, I would like Petersen referral to someone from [place]. [facility] is very convenient.    October 24, 2019  Erin Beck, FNP  6:41 PM Hi Erin Petersen,  I wanted to review the echocardiogram (the ultrasound of your heart) that was done 06/04/2019 as well as the EKGs (electrocardiogram- heart tracings) that we have on file and one that I was able to see from 12/16/2014.  Your QT interval was very slightly prolonged on the EKG from 06/03/19 at 479 (upper limit of normal for Petersen female is 470). The other EKGs showed normal QT intervals.  Your echocardiogram showed some elevated pulmonary pressures and I couldn't see where this was addressed?  I recommend we have you see Petersen cardiologist (I know, another specialist) to look at the qt and the echocardiogram results.  The cardiologist could help determine if you can resume the hydroxyzine. I was able to review the note from Dr. Amalia Petersen and it sounds like your interstitial cystitis has really been bothering you lately- I'm sorry to hear that! Let me know if you are ok to see the cardiologist and I am happy to put in Petersen referral.  Warm regards,  Tor Netters, FNP-BC   October 17, 2019  Erin Petersen, Erin Petersen  11:17 PM How does that compare  to the echo cardiogram that was done while I was in [facility] hospital.    October 15, 2019 Erin Beck, FNP  1:23 PM Erin Petersen,  The last EKG I see in the computer was from 06/23/2019. It did show some prolonged QT interval that was new from the previous tracing.  It is recommended to avoid hydroxyzine with QT prolongation.  It is also reasonable to have you follow-up with Petersen cardiologist regarding this.  I am happy to place Petersen referral for you. Warm regards,  Tor Netters, FNP-BC   October 14, 2019  Erin Petersen  4:46 PM I saw Erin Petersen today. He received Petersen letter from [employer] stating that he should stop my hydroxixune because of Petersen heart issue.  I don't have any heart issues that I know about.  They are basing this decision on an echo cardio gram done at [facility] when I was in the hospital in April. Dr Erin Petersen wants to know when I had my last cardiogram and if it was normal.  I remember having one in the fall when I had Petersen reaction to my flu shot.

## 2019-10-27 NOTE — Telephone Encounter (Signed)
Patient verbalized understanding of instructions  

## 2019-10-27 NOTE — Telephone Encounter (Signed)
Patient insurance company denied the Xifaxan please advised

## 2019-10-27 NOTE — Telephone Encounter (Signed)
Please try [facility], she can pick up few samples  RV

## 2019-10-27 NOTE — Telephone Encounter (Signed)
Patient is scheduled with Doristine Mango, PA-C  Wednesday 10/29/2019 at 3:15pm  [facility] [place], [place] [address]  (PHONE) [phone]   Message sent to patient making her aware.

## 2019-10-27 NOTE — Progress Notes (Signed)
Referral for cardiology placed.

## 2019-10-30 DIAGNOSIS — E559 Vitamin D deficiency, unspecified: Secondary | ICD-10-CM | POA: Diagnosis not present

## 2019-10-30 DIAGNOSIS — G2581 Restless legs syndrome: Secondary | ICD-10-CM | POA: Diagnosis not present

## 2019-10-30 DIAGNOSIS — E782 Mixed hyperlipidemia: Secondary | ICD-10-CM | POA: Diagnosis not present

## 2019-10-30 DIAGNOSIS — G4733 Obstructive sleep apnea (adult) (pediatric): Secondary | ICD-10-CM | POA: Diagnosis not present

## 2019-10-30 DIAGNOSIS — I10 Essential (primary) hypertension: Secondary | ICD-10-CM | POA: Diagnosis not present

## 2019-11-05 ENCOUNTER — Other Ambulatory Visit: Payer: Self-pay | Admitting: Family Medicine

## 2019-11-05 NOTE — Telephone Encounter (Signed)
Last visit 04/21/2019.  Last refill 09/04/2019 #90.  No upcoming appt.

## 2019-11-06 DIAGNOSIS — J386 Stenosis of larynx: Secondary | ICD-10-CM | POA: Diagnosis not present

## 2019-11-06 DIAGNOSIS — J383 Other diseases of vocal cords: Secondary | ICD-10-CM | POA: Diagnosis not present

## 2019-11-06 DIAGNOSIS — R061 Stridor: Secondary | ICD-10-CM | POA: Diagnosis not present

## 2019-11-06 DIAGNOSIS — R682 Dry mouth, unspecified: Secondary | ICD-10-CM | POA: Diagnosis not present

## 2019-11-07 DIAGNOSIS — G4733 Obstructive sleep apnea (adult) (pediatric): Secondary | ICD-10-CM | POA: Diagnosis not present

## 2019-11-12 DIAGNOSIS — M1711 Unilateral primary osteoarthritis, right knee: Secondary | ICD-10-CM | POA: Diagnosis not present

## 2019-11-21 ENCOUNTER — Encounter: Payer: Self-pay | Admitting: Family Medicine

## 2019-11-21 NOTE — Telephone Encounter (Signed)
Called pt in regards to her mychart message pertaining to flu shot.  I tried to provide information about scheduling a nurse visit and pt said "ok".  I attempted to ask questions about her reaction last year after receiving the flu vaccine but pt didn't have time to talk.  I advise pt to call back and schedule appt when she can.

## 2019-12-01 ENCOUNTER — Other Ambulatory Visit: Payer: Self-pay | Admitting: Gastroenterology

## 2019-12-01 DIAGNOSIS — R1031 Right lower quadrant pain: Secondary | ICD-10-CM

## 2019-12-02 ENCOUNTER — Encounter: Payer: Self-pay | Admitting: Family Medicine

## 2019-12-15 ENCOUNTER — Other Ambulatory Visit: Payer: Self-pay | Admitting: Orthopedic Surgery

## 2019-12-23 ENCOUNTER — Encounter
Admission: RE | Admit: 2019-12-23 | Discharge: 2019-12-23 | Disposition: A | Discharge status: Home / Self Care | Payer: Medicare Other | Source: Ambulatory Visit | Attending: Orthopedic Surgery | Admitting: Orthopedic Surgery

## 2019-12-23 ENCOUNTER — Other Ambulatory Visit: Payer: Self-pay

## 2019-12-23 DIAGNOSIS — Z01812 Encounter for preprocedural laboratory examination: Secondary | ICD-10-CM | POA: Insufficient documentation

## 2019-12-23 HISTORY — DX: Prediabetes: R73.03

## 2019-12-23 LAB — CBC WITH DIFFERENTIAL/PLATELET
Abs Immature Granulocytes: 0.03 10*3/uL (ref 0.00–0.07)
Basophils Absolute: 0.1 10*3/uL (ref 0.0–0.1)
Basophils Relative: 1 %
Eosinophils Absolute: 0.4 10*3/uL (ref 0.0–0.5)
Eosinophils Relative: 5 %
HCT: 38.9 % (ref 36.0–46.0)
Hemoglobin: 12.5 g/dL (ref 12.0–15.0)
Immature Granulocytes: 0 %
Lymphocytes Relative: 26 %
Lymphs Abs: 2.1 10*3/uL (ref 0.7–4.0)
MCH: 25 pg — ABNORMAL LOW (ref 26.0–34.0)
MCHC: 32.1 g/dL (ref 30.0–36.0)
MCV: 77.6 fL — ABNORMAL LOW (ref 80.0–100.0)
Monocytes Absolute: 1 10*3/uL (ref 0.1–1.0)
Monocytes Relative: 13 %
Neutro Abs: 4.4 10*3/uL (ref 1.7–7.7)
Neutrophils Relative %: 55 %
Platelets: 389 10*3/uL (ref 150–400)
RBC: 5.01 MIL/uL (ref 3.87–5.11)
RDW: 16 % — ABNORMAL HIGH (ref 11.5–15.5)
WBC: 7.9 10*3/uL (ref 4.0–10.5)
nRBC: 0 % (ref 0.0–0.2)

## 2019-12-23 LAB — COMPREHENSIVE METABOLIC PANEL
ALT: 20 U/L (ref 0–44)
AST: 20 U/L (ref 15–41)
Albumin: 4.3 g/dL (ref 3.5–5.0)
Alkaline Phosphatase: 92 U/L (ref 38–126)
Anion gap: 10 (ref 5–15)
BUN: 21 mg/dL (ref 8–23)
CO2: 28 mmol/L (ref 22–32)
Calcium: 9.7 mg/dL (ref 8.9–10.3)
Chloride: 99 mmol/L (ref 98–111)
Creatinine, Ser: 0.51 mg/dL (ref 0.44–1.00)
GFR, Estimated: >60 mL/min (ref >60)
Glucose, Bld: 121 mg/dL — ABNORMAL HIGH (ref 70–99)
Potassium: 3.4 mmol/L — ABNORMAL LOW (ref 3.5–5.1)
Sodium: 137 mmol/L (ref 135–145)
Total Bilirubin: 0.4 mg/dL (ref 0.3–1.2)
Total Protein: 7.8 g/dL (ref 6.5–8.1)

## 2019-12-23 LAB — URINALYSIS, ROUTINE W REFLEX MICROSCOPIC
Bilirubin Urine: NEGATIVE
Glucose, UA: NEGATIVE mg/dL
Hgb urine dipstick: NEGATIVE
Ketones, ur: NEGATIVE mg/dL
Leukocytes,Ua: NEGATIVE
Nitrite: NEGATIVE
Protein, ur: NEGATIVE mg/dL
Specific Gravity, Urine: 1.013 (ref 1.005–1.030)
pH: 5 (ref 5.0–8.0)

## 2019-12-23 LAB — SURGICAL PCR SCREEN
MRSA, PCR: NEGATIVE
Staphylococcus aureus: NEGATIVE

## 2019-12-23 LAB — TYPE AND SCREEN
ABO/RH(D): O POS
Antibody Screen: NEGATIVE

## 2019-12-23 NOTE — Patient Instructions (Signed)
Your procedure is scheduled on: Thurs. 11/4 Report to Day Surgery. To find out your arrival time please call [phone] between 1PM - 3PM on Wed. 11/3.  Remember: Instructions that are not followed completely may result in serious medical risk,  up to and including death, or upon the discretion of your surgeon and anesthesiologist your  surgery may need to be rescheduled.     _X__ 1. Do not eat food after midnight the night before your procedure.                 No chewing gum or hard candies. You may drink clear liquids up to 2 hours                 before you are scheduled to arrive for your surgery- DO not drink clear                 liquids within 2 hours of the start of your surgery.                 Clear Liquids include:  water, apple juice without pulp, clear Gatorade, G2 or                  Gatorade Zero (avoid Red/Purple/Blue), Black Coffee or Tea (Do not add                 anything to coffee or tea). __x___2.   Complete the "Ensure Clear Pre-surgery Clear Carbohydrate Drink" provided to you, 2 hours before arrival. **If you       are diabetic you will be provided with an alternative drink, Gatorade Zero or G2.  __X__2.  On the morning of surgery brush your teeth with toothpaste and water, you                may rinse your mouth with mouthwash if you wish.  Do not swallow any toothpaste of mouthwash.     ___ 3.  No Alcohol for 24 hours before or after surgery.   ___ 4.  Do Not Smoke or use e-cigarettes For 24 Hours Prior to Your Surgery.                 Do not use any chewable tobacco products for at least 6 hours prior to                 Surgery.  ___  5.  Do not use any recreational drugs (marijuana, cocaine, heroin, ecstasy, MDMA or other)                For at least one week prior to your surgery.  Combination of these drugs with anesthesia                May have life threatening results.  ____  6.  Bring all medications with you on the day of  surgery if instructed.   __x__  7.  Notify your doctor if there is any change in your medical condition      (cold, fever, infections).     Do not wear jewelry, make-up, hairpins, clips or nail polish. Do not wear lotions, powders, or perfumes. You may wear deodorant. Do not shave 48 hours prior to surgery. Do not bring valuables to the hospital.    [facility] is not responsible for any belongings or valuables.  Contacts, dentures or bridgework may not be worn into surgery. Leave your suitcase in the car. After surgery  it may be brought to your room. For patients admitted to the hospital, discharge time is determined by your treatment team.   Patients discharged the day of surgery will not be allowed to drive home.   Make arrangements for someone to be with you for the first 24 hours of your Same Day Discharge.    Please read over the following fact sheets that you were given:    __x__ Take these medicines the morning of surgery with A SIP OF WATER:    1. acetaminophen (TYLENOL) 650 MG CR tablet if needed  2. amLODipine (NORVASC) 5 MG tablet  3. LINZESS 145 MCG CAPS capsule  4.omeprazole (PRILOSEC) 40 MG capsule  Night before and morning of surgery  5.pregabalin (LYRICA) 150 MG capsule  6.rOPINIRole (REQUIP) 1 MG tablet  ____ Fleet Enema (as directed)   __x__ Use CHG Soap (or wipes) as directed  ____ Use Benzoyl Peroxide Gel as instructed  __x__ Use inhalers on the day of surgery albuterol (VENTOLIN HFA) 108 (90 Base) MCG/ACT inhaler and bring with you  ____ Stop metformin 2 days prior to surgery    ____ Take 1/2 of usual insulin dose the night before surgery. No insulin the morning          of surgery.   __x__ Stop aspirin as per MD instruction   __x__ Stop Anti-inflammatories   meloxicam (MOBIC) 15 MG tablet ibuprofen aleve on 10/28   __x__ Stop supplements until after surgery.  Melatonin 10 MG TABS on 10/28  __x__ Bring C-Pap to the hospital.    If you have  any questions regarding your pre-procedure instructions,  Please call Pre-admit Testing at [place]

## 2019-12-30 ENCOUNTER — Other Ambulatory Visit
Admission: RE | Admit: 2019-12-30 | Discharge: 2019-12-30 | Disposition: A | Discharge status: Home / Self Care | Payer: Medicare Other | Source: Ambulatory Visit | Attending: Orthopedic Surgery | Admitting: Orthopedic Surgery

## 2019-12-30 ENCOUNTER — Other Ambulatory Visit: Payer: Self-pay

## 2019-12-30 DIAGNOSIS — R7303 Prediabetes: Secondary | ICD-10-CM | POA: Diagnosis not present

## 2019-12-30 DIAGNOSIS — Z87440 Personal history of urinary (tract) infections: Secondary | ICD-10-CM | POA: Diagnosis not present

## 2019-12-30 DIAGNOSIS — M1711 Unilateral primary osteoarthritis, right knee: Secondary | ICD-10-CM | POA: Diagnosis not present

## 2019-12-30 DIAGNOSIS — G629 Polyneuropathy, unspecified: Secondary | ICD-10-CM | POA: Diagnosis not present

## 2019-12-30 DIAGNOSIS — I10 Essential (primary) hypertension: Secondary | ICD-10-CM | POA: Diagnosis not present

## 2019-12-30 DIAGNOSIS — Z79899 Other long term (current) drug therapy: Secondary | ICD-10-CM | POA: Diagnosis not present

## 2019-12-30 DIAGNOSIS — E785 Hyperlipidemia, unspecified: Secondary | ICD-10-CM | POA: Diagnosis not present

## 2019-12-30 DIAGNOSIS — Z791 Long term (current) use of non-steroidal anti-inflammatories (NSAID): Secondary | ICD-10-CM | POA: Diagnosis not present

## 2019-12-30 DIAGNOSIS — Z01812 Encounter for preprocedural laboratory examination: Secondary | ICD-10-CM | POA: Insufficient documentation

## 2019-12-30 DIAGNOSIS — M810 Age-related osteoporosis without current pathological fracture: Secondary | ICD-10-CM | POA: Diagnosis not present

## 2019-12-30 DIAGNOSIS — Z87892 Personal history of anaphylaxis: Secondary | ICD-10-CM | POA: Diagnosis not present

## 2019-12-30 DIAGNOSIS — Z20822 Contact with and (suspected) exposure to covid-19: Secondary | ICD-10-CM | POA: Insufficient documentation

## 2019-12-30 DIAGNOSIS — G2581 Restless legs syndrome: Secondary | ICD-10-CM | POA: Diagnosis not present

## 2019-12-30 DIAGNOSIS — M722 Plantar fascial fibromatosis: Secondary | ICD-10-CM | POA: Diagnosis not present

## 2019-12-30 DIAGNOSIS — Z8711 Personal history of peptic ulcer disease: Secondary | ICD-10-CM | POA: Diagnosis not present

## 2019-12-30 DIAGNOSIS — J449 Chronic obstructive pulmonary disease, unspecified: Secondary | ICD-10-CM | POA: Diagnosis not present

## 2019-12-30 DIAGNOSIS — Z981 Arthrodesis status: Secondary | ICD-10-CM | POA: Diagnosis not present

## 2019-12-30 DIAGNOSIS — Z85828 Personal history of other malignant neoplasm of skin: Secondary | ICD-10-CM | POA: Diagnosis not present

## 2019-12-30 DIAGNOSIS — G4733 Obstructive sleep apnea (adult) (pediatric): Secondary | ICD-10-CM | POA: Diagnosis not present

## 2019-12-30 DIAGNOSIS — E559 Vitamin D deficiency, unspecified: Secondary | ICD-10-CM | POA: Diagnosis not present

## 2019-12-30 DIAGNOSIS — Z974 Presence of external hearing-aid: Secondary | ICD-10-CM | POA: Diagnosis not present

## 2019-12-30 DIAGNOSIS — Z7982 Long term (current) use of aspirin: Secondary | ICD-10-CM | POA: Diagnosis not present

## 2019-12-30 DIAGNOSIS — K219 Gastro-esophageal reflux disease without esophagitis: Secondary | ICD-10-CM | POA: Diagnosis not present

## 2019-12-30 DIAGNOSIS — G4730 Sleep apnea, unspecified: Secondary | ICD-10-CM | POA: Diagnosis not present

## 2019-12-30 LAB — SARS CORONAVIRUS 2 (TAT 6-24 HRS): SARS Coronavirus 2: NEGATIVE

## 2020-01-01 ENCOUNTER — Encounter
Admission: RE | Disposition: A | Discharge status: Home Health | Payer: Self-pay | Source: Home / Self Care | Attending: Orthopedic Surgery

## 2020-01-01 ENCOUNTER — Inpatient Hospital Stay: Payer: Medicare Other | Admitting: Certified Registered"

## 2020-01-01 ENCOUNTER — Other Ambulatory Visit: Payer: Self-pay

## 2020-01-01 ENCOUNTER — Inpatient Hospital Stay: Payer: Medicare Other

## 2020-01-01 ENCOUNTER — Encounter: Payer: Self-pay | Admitting: Orthopedic Surgery

## 2020-01-01 ENCOUNTER — Inpatient Hospital Stay
Admission: RE | Admit: 2020-01-01 | Discharge: 2020-01-04 | DRG: 470 | Disposition: A | Discharge status: Home Health | Payer: Medicare Other | Attending: Orthopedic Surgery | Admitting: Orthopedic Surgery

## 2020-01-01 DIAGNOSIS — M722 Plantar fascial fibromatosis: Secondary | ICD-10-CM | POA: Diagnosis present

## 2020-01-01 DIAGNOSIS — Z87440 Personal history of urinary (tract) infections: Secondary | ICD-10-CM

## 2020-01-01 DIAGNOSIS — Z7982 Long term (current) use of aspirin: Secondary | ICD-10-CM | POA: Diagnosis not present

## 2020-01-01 DIAGNOSIS — Z974 Presence of external hearing-aid: Secondary | ICD-10-CM | POA: Diagnosis not present

## 2020-01-01 DIAGNOSIS — R7303 Prediabetes: Secondary | ICD-10-CM | POA: Diagnosis present

## 2020-01-01 DIAGNOSIS — Z20822 Contact with and (suspected) exposure to covid-19: Secondary | ICD-10-CM | POA: Diagnosis present

## 2020-01-01 DIAGNOSIS — K219 Gastro-esophageal reflux disease without esophagitis: Secondary | ICD-10-CM | POA: Diagnosis present

## 2020-01-01 DIAGNOSIS — G4730 Sleep apnea, unspecified: Secondary | ICD-10-CM | POA: Diagnosis present

## 2020-01-01 DIAGNOSIS — J449 Chronic obstructive pulmonary disease, unspecified: Secondary | ICD-10-CM | POA: Diagnosis present

## 2020-01-01 DIAGNOSIS — Z96651 Presence of right artificial knee joint: Secondary | ICD-10-CM | POA: Diagnosis not present

## 2020-01-01 DIAGNOSIS — G4733 Obstructive sleep apnea (adult) (pediatric): Secondary | ICD-10-CM | POA: Diagnosis not present

## 2020-01-01 DIAGNOSIS — M810 Age-related osteoporosis without current pathological fracture: Secondary | ICD-10-CM | POA: Diagnosis present

## 2020-01-01 DIAGNOSIS — G8918 Other acute postprocedural pain: Secondary | ICD-10-CM

## 2020-01-01 DIAGNOSIS — I517 Cardiomegaly: Secondary | ICD-10-CM | POA: Diagnosis not present

## 2020-01-01 DIAGNOSIS — M1711 Unilateral primary osteoarthritis, right knee: Secondary | ICD-10-CM | POA: Diagnosis not present

## 2020-01-01 DIAGNOSIS — Z87892 Personal history of anaphylaxis: Secondary | ICD-10-CM

## 2020-01-01 DIAGNOSIS — Z881 Allergy status to other antibiotic agents status: Secondary | ICD-10-CM

## 2020-01-01 DIAGNOSIS — Z79899 Other long term (current) drug therapy: Secondary | ICD-10-CM

## 2020-01-01 DIAGNOSIS — Z981 Arthrodesis status: Secondary | ICD-10-CM

## 2020-01-01 DIAGNOSIS — R062 Wheezing: Secondary | ICD-10-CM

## 2020-01-01 DIAGNOSIS — E785 Hyperlipidemia, unspecified: Secondary | ICD-10-CM | POA: Diagnosis not present

## 2020-01-01 DIAGNOSIS — E559 Vitamin D deficiency, unspecified: Secondary | ICD-10-CM | POA: Diagnosis present

## 2020-01-01 DIAGNOSIS — E7800 Pure hypercholesterolemia, unspecified: Secondary | ICD-10-CM | POA: Diagnosis not present

## 2020-01-01 DIAGNOSIS — Z85828 Personal history of other malignant neoplasm of skin: Secondary | ICD-10-CM | POA: Diagnosis not present

## 2020-01-01 DIAGNOSIS — J45909 Unspecified asthma, uncomplicated: Secondary | ICD-10-CM | POA: Diagnosis not present

## 2020-01-01 DIAGNOSIS — Z8711 Personal history of peptic ulcer disease: Secondary | ICD-10-CM

## 2020-01-01 DIAGNOSIS — I10 Essential (primary) hypertension: Secondary | ICD-10-CM | POA: Diagnosis present

## 2020-01-01 DIAGNOSIS — R531 Weakness: Secondary | ICD-10-CM | POA: Diagnosis not present

## 2020-01-01 DIAGNOSIS — G629 Polyneuropathy, unspecified: Secondary | ICD-10-CM | POA: Diagnosis present

## 2020-01-01 DIAGNOSIS — Z91030 Bee allergy status: Secondary | ICD-10-CM

## 2020-01-01 DIAGNOSIS — Z79890 Hormone replacement therapy (postmenopausal): Secondary | ICD-10-CM | POA: Diagnosis not present

## 2020-01-01 DIAGNOSIS — Z471 Aftercare following joint replacement surgery: Secondary | ICD-10-CM | POA: Diagnosis not present

## 2020-01-01 DIAGNOSIS — G2581 Restless legs syndrome: Secondary | ICD-10-CM | POA: Diagnosis present

## 2020-01-01 DIAGNOSIS — Z791 Long term (current) use of non-steroidal anti-inflammatories (NSAID): Secondary | ICD-10-CM

## 2020-01-01 DIAGNOSIS — Z882 Allergy status to sulfonamides status: Secondary | ICD-10-CM

## 2020-01-01 HISTORY — PX: TOTAL KNEE ARTHROPLASTY: SHX125

## 2020-01-01 LAB — CBC
HCT: 37.4 % (ref 36.0–46.0)
Hemoglobin: 11.9 g/dL — ABNORMAL LOW (ref 12.0–15.0)
MCH: 25.2 pg — ABNORMAL LOW (ref 26.0–34.0)
MCHC: 31.8 g/dL (ref 30.0–36.0)
MCV: 79.1 fL — ABNORMAL LOW (ref 80.0–100.0)
Platelets: 391 10*3/uL (ref 150–400)
RBC: 4.73 MIL/uL (ref 3.87–5.11)
RDW: 15.5 % (ref 11.5–15.5)
WBC: 15.7 10*3/uL — ABNORMAL HIGH (ref 4.0–10.5)
nRBC: 0 % (ref 0.0–0.2)

## 2020-01-01 LAB — CREATININE, SERUM
Creatinine, Ser: 0.57 mg/dL (ref 0.44–1.00)
GFR, Estimated: >60 mL/min (ref >60)

## 2020-01-01 LAB — ABO/RH: ABO/RH(D): O POS

## 2020-01-01 SURGERY — ARTHROPLASTY, KNEE, TOTAL
Anesthesia: Spinal | Site: Knee | Laterality: Right

## 2020-01-01 MED ORDER — BISACODYL 10 MG RE SUPP
10.0000 mg | Freq: Every day | RECTAL | Status: DC | PRN
  Administered 2020-01-04: 10 mg via RECTAL
  Filled 2020-01-01 (×2): qty 1

## 2020-01-01 MED ORDER — ALBUTEROL SULFATE HFA 108 (90 BASE) MCG/ACT IN AERS
2.0000 | INHALATION_SPRAY | Freq: Four times a day (QID) | RESPIRATORY_TRACT | Status: DC | PRN
  Filled 2020-01-01: qty 6.7

## 2020-01-01 MED ORDER — HYDROMORPHONE HCL 1 MG/ML IJ SOLN
0.2500 mg | INTRAMUSCULAR | Status: DC | PRN
  Administered 2020-01-01 (×2): 0.5 mg via INTRAVENOUS

## 2020-01-01 MED ORDER — PANTOPRAZOLE SODIUM 40 MG PO TBEC
80.0000 mg | DELAYED_RELEASE_TABLET | Freq: Every day | ORAL | Status: DC
  Administered 2020-01-02 – 2020-01-04 (×3): 80 mg via ORAL
  Filled 2020-01-01 (×3): qty 2

## 2020-01-01 MED ORDER — METHOCARBAMOL 500 MG PO TABS
500.0000 mg | ORAL_TABLET | Freq: Four times a day (QID) | ORAL | Status: DC | PRN
  Administered 2020-01-02 – 2020-01-03 (×3): 500 mg via ORAL
  Filled 2020-01-01 (×3): qty 1

## 2020-01-01 MED ORDER — ONDANSETRON HCL 4 MG/2ML IJ SOLN
4.0000 mg | Freq: Four times a day (QID) | INTRAMUSCULAR | Status: DC | PRN
  Administered 2020-01-02: 4 mg via INTRAVENOUS
  Filled 2020-01-01: qty 2

## 2020-01-01 MED ORDER — DOCUSATE SODIUM 100 MG PO CAPS
100.0000 mg | ORAL_CAPSULE | Freq: Two times a day (BID) | ORAL | Status: DC
  Administered 2020-01-01 – 2020-01-04 (×6): 100 mg via ORAL
  Filled 2020-01-01 (×6): qty 1

## 2020-01-01 MED ORDER — ROPINIROLE HCL 1 MG PO TABS: 1.0000 mg | ORAL_TABLET | ORAL | Status: DC

## 2020-01-01 MED ORDER — HYDROCHLOROTHIAZIDE 25 MG PO TABS
25.0000 mg | ORAL_TABLET | Freq: Every day | ORAL | Status: DC
  Administered 2020-01-02 – 2020-01-04 (×3): 25 mg via ORAL
  Filled 2020-01-01 (×3): qty 1

## 2020-01-01 MED ORDER — PROPOFOL 500 MG/50ML IV EMUL
INTRAVENOUS | Status: AC
  Filled 2020-01-01: qty 50

## 2020-01-01 MED ORDER — AMITRIPTYLINE HCL 25 MG PO TABS
25.0000 mg | ORAL_TABLET | Freq: Every day | ORAL | Status: DC
  Administered 2020-01-01 – 2020-01-03 (×3): 25 mg via ORAL
  Filled 2020-01-01 (×4): qty 1

## 2020-01-01 MED ORDER — METOCLOPRAMIDE HCL 10 MG PO TABS: 5.0000 mg | ORAL_TABLET | Freq: Three times a day (TID) | ORAL | Status: DC | PRN

## 2020-01-01 MED ORDER — ROPINIROLE HCL 1 MG PO TABS
1.0000 mg | ORAL_TABLET | Freq: Every day | ORAL | Status: DC
  Administered 2020-01-02 – 2020-01-04 (×3): 1 mg via ORAL
  Filled 2020-01-01 (×3): qty 1

## 2020-01-01 MED ORDER — SODIUM CHLORIDE 0.9 % IV SOLN: INTRAVENOUS | Status: DC

## 2020-01-01 MED ORDER — ASPIRIN EC 81 MG PO TBEC
81.0000 mg | DELAYED_RELEASE_TABLET | Freq: Every day | ORAL | Status: DC
  Administered 2020-01-01 – 2020-01-03 (×3): 81 mg via ORAL
  Filled 2020-01-01 (×3): qty 1

## 2020-01-01 MED ORDER — CEFAZOLIN SODIUM-DEXTROSE 2-4 GM/100ML-% IV SOLN
INTRAVENOUS | Status: AC
  Filled 2020-01-01: qty 100

## 2020-01-01 MED ORDER — ROPINIROLE HCL 1 MG PO TABS
4.0000 mg | ORAL_TABLET | Freq: Every day | ORAL | Status: DC
  Administered 2020-01-01 – 2020-01-03 (×3): 4 mg via ORAL
  Filled 2020-01-01 (×3): qty 4

## 2020-01-01 MED ORDER — FLUTICASONE PROPIONATE 50 MCG/ACT NA SUSP
1.0000 | Freq: Every day | NASAL | Status: DC
  Administered 2020-01-02 – 2020-01-04 (×3): 1 via NASAL
  Filled 2020-01-01: qty 16

## 2020-01-01 MED ORDER — SIMVASTATIN 20 MG PO TABS
20.0000 mg | ORAL_TABLET | Freq: Every day | ORAL | Status: DC
  Administered 2020-01-01 – 2020-01-03 (×3): 20 mg via ORAL
  Filled 2020-01-01 (×3): qty 1

## 2020-01-01 MED ORDER — HYDROCODONE-ACETAMINOPHEN 7.5-325 MG PO TABS
ORAL_TABLET | ORAL | Status: AC
  Filled 2020-01-01: qty 1

## 2020-01-01 MED ORDER — BUPIVACAINE LIPOSOME 1.3 % IJ SUSP
INTRAMUSCULAR | Status: AC
  Filled 2020-01-01: qty 20

## 2020-01-01 MED ORDER — MORPHINE SULFATE (PF) 2 MG/ML IV SOLN
INTRAVENOUS | Status: AC
  Administered 2020-01-01: 1 mg via INTRAVENOUS
  Filled 2020-01-01: qty 1

## 2020-01-01 MED ORDER — FENTANYL CITRATE (PF) 100 MCG/2ML IJ SOLN
25.0000 ug | INTRAMUSCULAR | Status: AC | PRN
  Administered 2020-01-01 (×2): 25 ug via INTRAVENOUS

## 2020-01-01 MED ORDER — ALUM & MAG HYDROXIDE-SIMETH 200-200-20 MG/5ML PO SUSP: 30.0000 mL | ORAL | Status: DC | PRN

## 2020-01-01 MED ORDER — ENOXAPARIN SODIUM 30 MG/0.3ML ~~LOC~~ SOLN
30.0000 mg | Freq: Two times a day (BID) | SUBCUTANEOUS | Status: DC
  Administered 2020-01-02 – 2020-01-04 (×5): 30 mg via SUBCUTANEOUS
  Filled 2020-01-01 (×5): qty 0.3

## 2020-01-01 MED ORDER — MORPHINE SULFATE (PF) 2 MG/ML IV SOLN
0.5000 mg | INTRAVENOUS | Status: DC | PRN
  Administered 2020-01-01 – 2020-01-02 (×2): 1 mg via INTRAVENOUS
  Filled 2020-01-01 (×2): qty 1

## 2020-01-01 MED ORDER — ESTRADIOL 0.1 MG/GM VA CREA
1.0000 | TOPICAL_CREAM | Freq: Every day | VAGINAL | Status: DC
  Administered 2020-01-01: 1 via VAGINAL
  Filled 2020-01-01: qty 42.5

## 2020-01-01 MED ORDER — PHENOL 1.4 % MT LIQD
1.0000 | OROMUCOSAL | Status: DC | PRN
  Filled 2020-01-01: qty 177

## 2020-01-01 MED ORDER — FENTANYL CITRATE (PF) 100 MCG/2ML IJ SOLN
INTRAMUSCULAR | Status: AC
  Filled 2020-01-01: qty 2

## 2020-01-01 MED ORDER — FENTANYL CITRATE (PF) 100 MCG/2ML IJ SOLN
INTRAMUSCULAR | Status: AC
  Administered 2020-01-01: 25 ug via INTRAVENOUS
  Filled 2020-01-01: qty 2

## 2020-01-01 MED ORDER — VITAMIN B-12 1000 MCG PO TABS
3000.0000 ug | ORAL_TABLET | Freq: Every day | ORAL | Status: DC
  Administered 2020-01-02 – 2020-01-04 (×3): 3000 ug via ORAL
  Filled 2020-01-01 (×3): qty 3

## 2020-01-01 MED ORDER — MELATONIN 5 MG PO TABS
5.0000 mg | ORAL_TABLET | Freq: Every day | ORAL | Status: DC
  Administered 2020-01-01 – 2020-01-03 (×3): 5 mg via ORAL
  Filled 2020-01-01 (×3): qty 1

## 2020-01-01 MED ORDER — CEFAZOLIN SODIUM-DEXTROSE 1-4 GM/50ML-% IV SOLN
1.0000 g | Freq: Four times a day (QID) | INTRAVENOUS | Status: AC
  Administered 2020-01-01 (×2): 1 g via INTRAVENOUS
  Filled 2020-01-01 (×2): qty 50

## 2020-01-01 MED ORDER — POLYETHYLENE GLYCOL 3350 17 G PO PACK
1.0000 | PACK | Freq: Every day | ORAL | Status: DC
  Administered 2020-01-02 – 2020-01-04 (×3): 17 g via ORAL
  Filled 2020-01-01 (×3): qty 1

## 2020-01-01 MED ORDER — ONDANSETRON HCL 4 MG PO TABS
4.0000 mg | ORAL_TABLET | Freq: Four times a day (QID) | ORAL | Status: DC | PRN
  Administered 2020-01-01: 4 mg via ORAL
  Filled 2020-01-01: qty 1

## 2020-01-01 MED ORDER — FENTANYL CITRATE (PF) 100 MCG/2ML IJ SOLN
25.0000 ug | INTRAMUSCULAR | Status: AC | PRN
  Administered 2020-01-01 (×4): 25 ug via INTRAVENOUS

## 2020-01-01 MED ORDER — AMLODIPINE BESYLATE 5 MG PO TABS
5.0000 mg | ORAL_TABLET | Freq: Every day | ORAL | Status: DC
  Administered 2020-01-02 – 2020-01-04 (×3): 5 mg via ORAL
  Filled 2020-01-01 (×3): qty 1

## 2020-01-01 MED ORDER — PREGABALIN 75 MG PO CAPS
150.0000 mg | ORAL_CAPSULE | Freq: Two times a day (BID) | ORAL | Status: DC
  Administered 2020-01-01 – 2020-01-04 (×6): 150 mg via ORAL
  Filled 2020-01-01 (×6): qty 2

## 2020-01-01 MED ORDER — MENTHOL 3 MG MT LOZG
1.0000 | LOZENGE | OROMUCOSAL | Status: DC | PRN
  Filled 2020-01-01: qty 9

## 2020-01-01 MED ORDER — ZOLPIDEM TARTRATE 5 MG PO TABS: 5.0000 mg | ORAL_TABLET | Freq: Every evening | ORAL | Status: DC | PRN

## 2020-01-01 MED ORDER — MAGNESIUM HYDROXIDE 400 MG/5ML PO SUSP
30.0000 mL | Freq: Every day | ORAL | Status: DC | PRN
  Filled 2020-01-01: qty 30

## 2020-01-01 MED ORDER — BUPIVACAINE-EPINEPHRINE (PF) 0.25% -1:200000 IJ SOLN
INTRAMUSCULAR | Status: AC
  Filled 2020-01-01: qty 30

## 2020-01-01 MED ORDER — MORPHINE SULFATE (PF) 10 MG/ML IV SOLN
INTRAVENOUS | Status: DC | PRN
  Administered 2020-01-01: 10 mg

## 2020-01-01 MED ORDER — CHLORHEXIDINE GLUCONATE 0.12 % MT SOLN: 15.0000 mL | Freq: Once | OROMUCOSAL | Status: AC

## 2020-01-01 MED ORDER — HYDROMORPHONE HCL 1 MG/ML IJ SOLN
INTRAMUSCULAR | Status: AC
  Administered 2020-01-01: 0.5 mg via INTRAVENOUS
  Filled 2020-01-01: qty 1

## 2020-01-01 MED ORDER — MORPHINE SULFATE (PF) 10 MG/ML IV SOLN
INTRAVENOUS | Status: AC
  Filled 2020-01-01: qty 1

## 2020-01-01 MED ORDER — HYDROXYZINE HCL 25 MG PO TABS
25.0000 mg | ORAL_TABLET | Freq: Every day | ORAL | Status: DC
  Administered 2020-01-01 – 2020-01-03 (×3): 25 mg via ORAL
  Filled 2020-01-01 (×4): qty 1

## 2020-01-01 MED ORDER — PHENYLEPHRINE HCL (PRESSORS) 10 MG/ML IV SOLN
INTRAVENOUS | Status: AC
  Filled 2020-01-01: qty 1

## 2020-01-01 MED ORDER — CEFAZOLIN SODIUM-DEXTROSE 2-4 GM/100ML-% IV SOLN
2.0000 g | INTRAVENOUS | Status: AC
  Administered 2020-01-01: 2 g via INTRAVENOUS

## 2020-01-01 MED ORDER — FUSION PLUS PO CAPS: 1.0000 | ORAL_CAPSULE | Freq: Every day | ORAL | Status: DC

## 2020-01-01 MED ORDER — CHLORHEXIDINE GLUCONATE CLOTH 2 % EX PADS
6.0000 | MEDICATED_PAD | Freq: Every day | CUTANEOUS | Status: DC
  Administered 2020-01-02 – 2020-01-04 (×3): 6 via TOPICAL

## 2020-01-01 MED ORDER — SODIUM CHLORIDE FLUSH 0.9 % IV SOLN
INTRAVENOUS | Status: AC
  Filled 2020-01-01: qty 40

## 2020-01-01 MED ORDER — ONDANSETRON HCL 4 MG/2ML IJ SOLN: 4.0000 mg | Freq: Once | INTRAMUSCULAR | Status: DC | PRN

## 2020-01-01 MED ORDER — BUPIVACAINE-EPINEPHRINE (PF) 0.25% -1:200000 IJ SOLN
INTRAMUSCULAR | Status: DC | PRN
  Administered 2020-01-01: 30 mL

## 2020-01-01 MED ORDER — METOCLOPRAMIDE HCL 5 MG/ML IJ SOLN: 5.0000 mg | Freq: Three times a day (TID) | INTRAMUSCULAR | Status: DC | PRN

## 2020-01-01 MED ORDER — ORAL CARE MOUTH RINSE: 15.0000 mL | Freq: Once | OROMUCOSAL | Status: AC

## 2020-01-01 MED ORDER — HYDROCODONE-ACETAMINOPHEN 5-325 MG PO TABS
1.0000 | ORAL_TABLET | ORAL | Status: DC | PRN
  Administered 2020-01-01: 1 via ORAL
  Administered 2020-01-01: 2 via ORAL
  Administered 2020-01-02: 1 via ORAL
  Filled 2020-01-01 (×2): qty 2
  Filled 2020-01-01 (×2): qty 1

## 2020-01-01 MED ORDER — VITAMIN D3 25 MCG (1000 UNIT) PO TABS
2000.0000 [IU] | ORAL_TABLET | Freq: Every day | ORAL | Status: DC
  Administered 2020-01-02 – 2020-01-04 (×3): 2000 [IU] via ORAL
  Filled 2020-01-01 (×7): qty 2

## 2020-01-01 MED ORDER — NEOMYCIN-POLYMYXIN B GU 40-200000 IR SOLN
Status: DC | PRN
  Administered 2020-01-01: 2 mL

## 2020-01-01 MED ORDER — MAGNESIUM CITRATE PO SOLN
1.0000 | Freq: Once | ORAL | Status: DC | PRN
  Filled 2020-01-01: qty 296

## 2020-01-01 MED ORDER — HYDROCODONE-ACETAMINOPHEN 7.5-325 MG PO TABS
1.0000 | ORAL_TABLET | ORAL | Status: DC | PRN
  Administered 2020-01-01: 1 via ORAL
  Administered 2020-01-02: 2 via ORAL
  Filled 2020-01-01 (×3): qty 2

## 2020-01-01 MED ORDER — METHOCARBAMOL 1000 MG/10ML IJ SOLN
500.0000 mg | Freq: Four times a day (QID) | INTRAVENOUS | Status: DC | PRN
  Filled 2020-01-01: qty 5

## 2020-01-01 MED ORDER — DICYCLOMINE HCL 10 MG PO CAPS
10.0000 mg | ORAL_CAPSULE | Freq: Every day | ORAL | Status: DC
  Administered 2020-01-01 – 2020-01-03 (×3): 10 mg via ORAL
  Filled 2020-01-01 (×4): qty 1

## 2020-01-01 MED ORDER — LACTATED RINGERS IV SOLN: INTRAVENOUS | Status: DC

## 2020-01-01 MED ORDER — SODIUM CHLORIDE 0.9 % IV SOLN
INTRAVENOUS | Status: DC | PRN
  Administered 2020-01-01: 60 mL

## 2020-01-01 MED ORDER — PHENYLEPHRINE HCL (PRESSORS) 10 MG/ML IV SOLN
INTRAVENOUS | Status: DC | PRN
  Administered 2020-01-01: 100 ug via INTRAVENOUS

## 2020-01-01 MED ORDER — PROPOFOL 500 MG/50ML IV EMUL
INTRAVENOUS | Status: DC | PRN
  Administered 2020-01-01: 100 ug/kg/min via INTRAVENOUS

## 2020-01-01 MED ORDER — FENTANYL CITRATE (PF) 100 MCG/2ML IJ SOLN
INTRAMUSCULAR | Status: DC | PRN
  Administered 2020-01-01 (×2): 25 ug via INTRAVENOUS

## 2020-01-01 MED ORDER — TRAMADOL HCL 50 MG PO TABS
50.0000 mg | ORAL_TABLET | Freq: Four times a day (QID) | ORAL | Status: DC
  Administered 2020-01-01 – 2020-01-04 (×12): 50 mg via ORAL
  Filled 2020-01-01 (×12): qty 1

## 2020-01-01 MED ORDER — LINACLOTIDE 145 MCG PO CAPS
145.0000 ug | ORAL_CAPSULE | Freq: Every day | ORAL | Status: DC
  Administered 2020-01-02 – 2020-01-04 (×3): 145 ug via ORAL
  Filled 2020-01-01 (×3): qty 1

## 2020-01-01 MED ORDER — CHLORHEXIDINE GLUCONATE 0.12 % MT SOLN
OROMUCOSAL | Status: AC
  Administered 2020-01-01: 15 mL via OROMUCOSAL
  Filled 2020-01-01: qty 15

## 2020-01-01 MED ORDER — ACETAMINOPHEN 325 MG PO TABS: 325.0000 mg | ORAL_TABLET | Freq: Four times a day (QID) | ORAL | Status: DC | PRN

## 2020-01-01 MED ORDER — SODIUM CHLORIDE 0.9 % IV SOLN
INTRAVENOUS | Status: DC | PRN
  Administered 2020-01-01: 25 ug/min via INTRAVENOUS

## 2020-01-01 SURGICAL SUPPLY — 70 items
BLADE SAGITTAL 25.0X1.19X90 (BLADE) ×2 IMPLANT
BLADE SAGITTAL 25.0X1.19X90MM (BLADE) ×1
BLADE SAGITTAL AGGR TOOTH XLG (BLADE) ×3 IMPLANT
BNDG ELASTIC 6X5.8 VLCR STR LF (GAUZE/BANDAGES/DRESSINGS) ×3 IMPLANT
CANISTER SUCT 1200ML W/VALVE (MISCELLANEOUS) ×3 IMPLANT
CANISTER SUCT 3000ML PPV (MISCELLANEOUS) ×6 IMPLANT
CANISTER WOUND CARE 500ML ATS (WOUND CARE) ×3 IMPLANT
CEMENT HV SMART SET (Cement) ×6 IMPLANT
CHLORAPREP W/TINT 26 (MISCELLANEOUS) ×6 IMPLANT
COOLER POLAR GLACIER W/PUMP (MISCELLANEOUS) ×3 IMPLANT
COVER WAND RF STERILE (DRAPES) ×3 IMPLANT
CUFF TOURN SGL QUICK 24 (TOURNIQUET CUFF)
CUFF TOURN SGL QUICK 30 (TOURNIQUET CUFF)
CUFF TRNQT CYL 24X4X16.5-23 (TOURNIQUET CUFF) IMPLANT
CUFF TRNQT CYL 30X4X21-28X (TOURNIQUET CUFF) IMPLANT
DRAPE 3/4 80X56 (DRAPES) ×6 IMPLANT
DRSG MEPILEX SACRM 8.7X9.8 (GAUZE/BANDAGES/DRESSINGS) ×3 IMPLANT
ELECT CAUTERY BLADE 6.4 (BLADE) ×3 IMPLANT
ELECT REM PT RETURN 9FT ADLT (ELECTROSURGICAL) ×3
ELECTRODE REM PT RTRN 9FT ADLT (ELECTROSURGICAL) ×1 IMPLANT
FEMORAL COMP SZ3P RT SPHERE (Femur) ×3 IMPLANT
GAUZE SPONGE 4X4 12PLY STRL (GAUZE/BANDAGES/DRESSINGS) ×3 IMPLANT
GAUZE XEROFORM 1X8 LF (GAUZE/BANDAGES/DRESSINGS) ×3 IMPLANT
GLOVE BIOGEL PI IND STRL 9 (GLOVE) ×1 IMPLANT
GLOVE BIOGEL PI INDICATOR 9 (GLOVE) ×2
GLOVE INDICATOR 8.0 STRL GRN (GLOVE) ×3 IMPLANT
GLOVE SURG ORTHO 8.0 STRL STRW (GLOVE) ×3 IMPLANT
GLOVE SURG SYN 9.0  PF PI (GLOVE) ×2
GLOVE SURG SYN 9.0 PF PI (GLOVE) ×1 IMPLANT
GOWN SRG 2XL LVL 4 RGLN SLV (GOWNS) ×1 IMPLANT
GOWN STRL NON-REIN 2XL LVL4 (GOWNS) ×2
GOWN STRL REUS W/ TWL LRG LVL3 (GOWN DISPOSABLE) ×1 IMPLANT
GOWN STRL REUS W/ TWL XL LVL3 (GOWN DISPOSABLE) ×1 IMPLANT
GOWN STRL REUS W/TWL LRG LVL3 (GOWN DISPOSABLE) ×2
GOWN STRL REUS W/TWL XL LVL3 (GOWN DISPOSABLE) ×2
HOLDER FOLEY CATH W/STRAP (MISCELLANEOUS) ×3 IMPLANT
HOOD PEEL AWAY FLYTE STAYCOOL (MISCELLANEOUS) ×6 IMPLANT
INSERT TIBAIL SZ3 HT12 FLEX (Insert) ×3 IMPLANT
IRRIGATION SURGIPHOR STRL (IV SOLUTION) IMPLANT
KIT PREVENA INCISION MGT20CM45 (CANNISTER) ×3 IMPLANT
KIT TURNOVER KIT A (KITS) ×3 IMPLANT
NDL SAFETY ECLIPSE 18X1.5 (NEEDLE) ×1 IMPLANT
NEEDLE HYPO 18GX1.5 SHARP (NEEDLE) ×2
NEEDLE SPNL 18GX3.5 QUINCKE PK (NEEDLE) ×3 IMPLANT
NEEDLE SPNL 20GX3.5 QUINCKE YW (NEEDLE) ×3 IMPLANT
NS IRRIG 1000ML POUR BTL (IV SOLUTION) ×3 IMPLANT
PACK TOTAL KNEE (MISCELLANEOUS) ×3 IMPLANT
PAD WRAPON POLAR KNEE (MISCELLANEOUS) ×1 IMPLANT
PATELLA RESURFACING MEDACTA 02 (Bone Implant) ×3 IMPLANT
PENCIL SMOKE EVACUATOR COATED (MISCELLANEOUS) ×3 IMPLANT
PULSAVAC PLUS IRRIG FAN TIP (DISPOSABLE) ×3
SCALPEL PROTECTED #10 DISP (BLADE) ×6 IMPLANT
SOL .9 NS 3000ML IRR  AL (IV SOLUTION) ×2
SOL .9 NS 3000ML IRR UROMATIC (IV SOLUTION) ×1 IMPLANT
STAPLER SKIN PROX 35W (STAPLE) ×3 IMPLANT
STEM EXTENSION 11MMX30MM (Stem) ×3 IMPLANT
SUCTION FRAZIER HANDLE 10FR (MISCELLANEOUS) ×2
SUCTION TUBE FRAZIER 10FR DISP (MISCELLANEOUS) ×1 IMPLANT
SUT DVC 2 QUILL PDO  T11 36X36 (SUTURE) ×2
SUT DVC 2 QUILL PDO T11 36X36 (SUTURE) ×1 IMPLANT
SUT ETHIBOND 2 V 37 (SUTURE) IMPLANT
SUT V-LOC 90 ABS DVC 3-0 CL (SUTURE) ×3 IMPLANT
SYR 20ML LL LF (SYRINGE) ×3 IMPLANT
SYR 50ML LL SCALE MARK (SYRINGE) ×6 IMPLANT
TIP FAN IRRIG PULSAVAC PLUS (DISPOSABLE) ×1 IMPLANT
TOWEL OR 17X26 4PK STRL BLUE (TOWEL DISPOSABLE) ×3 IMPLANT
TOWER CARTRIDGE SMART MIX (DISPOSABLE) ×3 IMPLANT
TRAY FOLEY MTR SLVR 16FR STAT (SET/KITS/TRAYS/PACK) ×3 IMPLANT
TRAY TIB FX RT SZ 3 (Joint) ×3 IMPLANT
WRAPON POLAR PAD KNEE (MISCELLANEOUS) ×3

## 2020-01-01 NOTE — Transfer of Care (Signed)
Immediate Anesthesia Transfer of Care Note  Patient: Erin Petersen  Procedure(s) Performed: Right total knee arthroplasty - [name] to Assist (Right Knee)  Patient Location: PACU  Anesthesia Type:Spinal  Level of Consciousness: awake, alert  and oriented  Airway & Oxygen Therapy: Patient Spontanous Breathing  Post-op Assessment: Report given to RN and Post -op Vital signs reviewed and stable  Post vital signs: Reviewed and stable  Last Vitals:  Vitals Value Taken Time  BP 116/54 01/01/20 0935  Temp 36.8 C 01/01/20 0935  Pulse 77 01/01/20 0935  Resp 14 01/01/20 0935  SpO2 95 % 01/01/20 0935  Vitals shown include unvalidated device data.  Last Pain:  Vitals:   01/01/20 0637  TempSrc: Oral  PainSc: 4          Complications: No complications documented.

## 2020-01-01 NOTE — Anesthesia Preprocedure Evaluation (Signed)
Anesthesia Evaluation  Patient identified by MRN, date of birth, ID band Patient awake    Reviewed: Allergy & Precautions, H&P , NPO status , Patient's Chart, lab work & pertinent test results  History of Anesthesia Complications (+) AWARENESS UNDER ANESTHESIA and history of anesthetic complications  Airway Mallampati: II  TM Distance: <3 FB Neck ROM: limited    Dental  (+) Chipped, Dental Advidsory Given   Pulmonary shortness of breath, asthma , sleep apnea and Continuous Positive Airway Pressure Ventilation , neg recent URI,    Pulmonary exam normal        Cardiovascular Exercise Tolerance: Good hypertension, (-) angina(-) Past MI and (-) Cardiac Stents Normal cardiovascular exam+ dysrhythmias (long QT) (-) Valvular Problems/Murmurs     Neuro/Psych neg Seizures  Neuromuscular disease negative psych ROS   GI/Hepatic Neg liver ROS, PUD, GERD  Medicated and Controlled,  Endo/Other  negative endocrine ROS  Renal/GU negative Renal ROS  negative genitourinary   Musculoskeletal  (+) Arthritis ,   Abdominal   Peds  Hematology negative hematology ROS (+)   Anesthesia Other Findings Past Medical History: No date: Arthritis No date: Asthma     Comment:  exercise induced asthma No date: Colon polyps No date: Dyspnea No date: GERD (gastroesophageal reflux disease) No date: Hyperlipidemia No date: Hypertension No date: Neuropathy     Comment:  right leg and foot No date: Plantar fasciitis No date: Prediabetes No date: RLS (restless legs syndrome) No date: Sleep apnea     Comment:  CPAP No date: SOB (shortness of breath) No date: Urinary incontinence No date: Urinary tract infection No date: Wears hearing aid in right ear  Past Surgical History: 1972: ABDOMINAL HYSTERECTOMY 1958: APPENDECTOMY No date: BACK SURGERY     Comment:  lumbar fusion 01/07/2018: BILATERAL SALPINGOOPHORECTOMY 04/15/2019: BIOPSY      Comment:  Procedure: BIOPSY;  Surgeon: [name], MD;                Location: [facility];  Service: Endoscopy;; No date: CARDIAC CATHETERIZATION     Comment:  over 10 yrs ago.  [place], [place] 11/11/2018: COLONOSCOPY WITH PROPOFOL; N/A     Comment:  Procedure: COLONOSCOPY WITH PROPOFOL;  Surgeon: [name], MD;  Location: [facility];  Service:               Gastroenterology;  Laterality: N/A; 12/30/2018: CYSTO WITH HYDRODISTENSION; N/A     Comment:  Procedure: CYSTOSCOPY/HYDRODISTENSION WITH MARCAINE;                Surgeon: Bjorn Loser, MD;  Location: [facility];                Service: Urology;  Laterality: N/A; 04/15/2019: ESOPHAGOGASTRODUODENOSCOPY (EGD) WITH PROPOFOL; N/A     Comment:  Procedure: ESOPHAGOGASTRODUODENOSCOPY (EGD) WITH               PROPOFOL;  Surgeon: [name], MD;  Location:               [place];  Service: Endoscopy;  Laterality:               N/A;  sleep apnea No date: HERNIA REPAIR     Comment:  abd No date: HERNIA REPAIR     Comment:  umbilical No date: INGUINAL HERNIA REPAIR; Bilateral     Comment:  inguinal  No date: LAPAROSCOPIC BILATERAL SALPINGO OOPHERECTOMY 2002: SPINAL FUSION 1961: TONSILLECTOMY AND ADENOIDECTOMY  BMI    Body Mass Index: 31.71 kg/m      Reproductive/Obstetrics negative OB ROS                             Anesthesia Physical  Anesthesia Plan  ASA: III  Anesthesia Plan: Spinal   Post-op Pain Management:    Induction:   PONV Risk Score and Plan: Propofol infusion and TIVA  Airway Management Planned: Natural Airway and Nasal Cannula  Additional Equipment:   Intra-op Plan:   Post-operative Plan:   Informed Consent: I have reviewed the patients History and Physical, chart, labs and discussed the procedure including the risks, benefits and alternatives for the proposed anesthesia with the patient or authorized  representative who has indicated his/her understanding and acceptance.     Dental Advisory Given  Plan Discussed with: Anesthesiologist, CRNA and Surgeon  Anesthesia Plan Comments: (Patient consented for risks of anesthesia including but not limited to:  - adverse reactions to medications - risk of intubation if required - damage to eyes, teeth, lips or other oral mucosa - nerve damage due to positioning  - sore throat or hoarseness - Damage to heart, brain, nerves, lungs or loss of life  Patient voiced understanding.)        Anesthesia Quick Evaluation

## 2020-01-01 NOTE — Op Note (Signed)
01/01/2020  9:36 AM  PATIENT:  Erin Petersen  79 y.o. female  PRE-OPERATIVE DIAGNOSIS:  Primary osteoarthritis of right knee M17.11  POST-OPERATIVE DIAGNOSIS:  Primary osteoarthritis of right knee M17.11  PROCEDURE:  Procedure(s): Right total knee arthroplasty - [name] to Assist (Right)  SURGEON: Laurene Footman, MD  ASSISTANTS: [name], PA-C  ANESTHESIA:   spinal  EBL:  Total I/O In: 1000 [I.V.:900; IV Piggyback:100] Out: -   BLOOD ADMINISTERED:none  DRAINS: Incisional wound VAC   LOCAL MEDICATIONS USED:  MARCAINE    and OTHER Exparel and morphine  SPECIMEN:  No Specimen  DISPOSITION OF SPECIMEN:  N/A  COUNTS:  YES  TOURNIQUET:   Total Tourniquet Time Documented: Thigh (Right) - 59 minutes Total: Thigh (Right) - 59 minutes   IMPLANTS: Medacta GM K sphere system, 3+ right femur, 3 tibia, short stem, 12 mm insert and 2 patella, all components cemented  DICTATION: .Dragon Dictation patient was brought to the operating room and after adequate spinal anesthesia was obtained the right leg was prepped and draped in the usual sterile fashion.  After patient identification and timeout procedures were completed the leg was exsanguinated and tourniquet raised to 300 mmHg.  A midline skin incision made followed by medial parapatellar arthrotomy.  Medial compartment was bone-on-bone lateral and patellofemoral joints also had moderate to advanced osteoarthritis.  The fat pad was excised along with the ACL and PCL.  The tibial extra medullary guide was applied and appropriate proximal tibia cut carried out followed by excision of menisci.  Distal femoral drill hole was made and a 6 degree valgus cut was made in the distal femur measuring and placing the appropriate block gave a 3+ 03+ cutting guide placed anterior posterior and chamfer cuts carried out.  The 3 tibia was put in position and it appeared to cover the tibial well proximal tibial preparation carried out followed  by application of the 3+ femur and a 12 mm insert gave good range of motion and stability.  The distal femoral drill holes were made following this after removal of the trials and the trochlear groove cut made for final implant.  The patella was cut using the patellar cutting guide and measured to a size 2 after drill holes were made.  At this point the above local was infiltrated in the periarticular tissues.  The bony surfaces were thoroughly irrigated and dried and the tibial component cemented in place first with excess cement removed followed by placing the polyethylene component with setscrew with a torque screwdriver used.  The distal femoral component was placed and the knee held in extension while the patellar button was clamped into place.  After the cemented set excess cement was removed patella tracked well with no touch technique.  The knee was thoroughly irrigated with pulsatile lavage and tourniquet let down.  The arthrotomy was repaired using a heavy Quill suture followed by 3 OV lock for the subcutaneous tissue followed by skin staples.  Incisional wound VAC was applied along with Polar Care.  PLAN OF CARE: Admit to inpatient   PATIENT DISPOSITION:  PACU - hemodynamically stable.

## 2020-01-01 NOTE — Evaluation (Signed)
Physical Therapy Evaluation Patient Details Name: Erin Petersen MRN: [id number] DOB: 13-Sep-1940 Today's Date: 01/01/2020   History of Present Illness  Pt is a 79 y.o. female s/p R TKA 11/4 secondary primary OA.  PMH includes asthma, COPD, htn, sleep apnea, neuro-degenerative disorders, spine fusion 2002, neuropathy R leg and foot, RLS.  Clinical Impression  Prior to hospital admission, pt was independent with ambulation; h/o 3 falls in past 6 months; lives alone in 1 level home with 1 STE.  Pt became nauseas after performing LE ex's in bed; cold wash cloth and emesis basin provided; nurse notified of pt's symptoms and brought nausea medication.  Pt requesting to still get up to chair despite symptoms.  Currently pt is min assist semi-supine to sitting edge of bed and CGA to min assist x2 to perform transfers and walk a few feet bed to recliner with RW.  Pain 6/10 R knee end of session (nurse reporting pt not due for pain medication yet); polar care applied/activated and pt resting in recliner end of session.  Pt would benefit from skilled PT to address noted impairments and functional limitations (see below for any additional details).  Upon hospital discharge, pt would benefit from STR pending pt's progress.    Follow Up Recommendations SNF (pending progress)    Equipment Recommendations  Rolling walker with 5" wheels;3in1 (PT) (youth sized)    Recommendations for Other Services OT consult     Precautions / Restrictions Precautions Precautions: Fall;Knee Precaution Booklet Issued: Yes (comment) Precaution Comments: wound vac Restrictions Weight Bearing Restrictions: Yes RLE Weight Bearing: Weight bearing as tolerated      Mobility  Bed Mobility Overal bed mobility: Needs Assistance Bed Mobility: Supine to Sit     Supine to sit: Min assist;HOB elevated     General bed mobility comments: assist for R LE; vc's for technique; increased effort for pt to perform     Transfers Overall transfer level: Needs assistance Equipment used: Rolling walker (2 wheeled) Transfers: Sit to/from Stand Sit to Stand: Min guard;Min assist;+2 safety/equipment         General transfer comment: vc's for UE/LE placement for transfers; assist to initiate stand up to RW and control descent sitting down in recliner  Ambulation/Gait Ambulation/Gait assistance: Min guard;Min assist;+2 safety/equipment Gait Distance (Feet): 3 Feet (bed to recliner) Assistive device: Rolling walker (2 wheeled)   Gait velocity: decreased   General Gait Details: antalgic; decreased stance time R LE; vc's for walker use and taking steps  Stairs            Wheelchair Mobility    Modified Rankin (Stroke Patients Only)       Balance Overall balance assessment: Needs assistance Sitting-balance support: No upper extremity supported;Feet supported Sitting balance-Leahy Scale: Good Sitting balance - Comments: steady sitting reaching within BOS   Standing balance support: Single extremity supported Standing balance-Leahy Scale: Poor Standing balance comment: pt requiring at least single UE support for static standing balance                             Pertinent Vitals/Pain Pain Assessment: 0-10 Pain Score: 6  Pain Location: R knee Pain Descriptors / Indicators: Aching;Sore;Tender;Operative site guarding Pain Intervention(s): Limited activity within patient's tolerance;Monitored during session;Premedicated before session;Repositioned;Other (comment) (nurse present and aware; polar care applied and activated)  Vitals (HR and O2 on 2 L O2 via nasal cannula) stable and WFL throughout treatment session.    Home  Living Family/patient expects to be discharged to:: Private residence Living Arrangements: Alone   Type of Home: House Home Access: Stairs to enter Entrance Stairs-Rails: None Entrance Stairs-Number of Steps: 1 Home Layout: One level Home Equipment: Grab  bars - tub/shower;Shower seat - built in;Walker - 2 wheels;Cane - single point;Shower seat;Bedside commode      Prior Function Level of Independence: Independent         Comments: 3 falls in past 6 months     Hand Dominance   Dominant Hand: Right    Extremity/Trunk Assessment   Upper Extremity Assessment Upper Extremity Assessment: Overall WFL for tasks assessed    Lower Extremity Assessment Lower Extremity Assessment: RLE deficits/detail (L LE WFL) RLE Deficits / Details: fair R quad set strength; at least 3/5 AROM ankle DF/PF RLE: Unable to fully assess due to pain    Cervical / Trunk Assessment Cervical / Trunk Assessment: Normal  Communication   Communication: HOH (Hearing aide R ear)  Cognition Arousal/Alertness: Awake/alert Behavior During Therapy: WFL for tasks assessed/performed Overall Cognitive Status: Within Functional Limits for tasks assessed                                        General Comments General comments (skin integrity, edema, etc.): R LE dressings and wound vac in place.  Nursing cleared pt for participation in physical therapy.  Pt agreeable to PT session.    Exercises Total Joint Exercises Ankle Circles/Pumps: AROM;Strengthening;Both;10 reps;Supine Quad Sets: AROM;Strengthening;Both;10 reps;Supine Short Arc Quad:  (pt unable to tolerate so deferred) Heel Slides: AAROM;Strengthening;Right;10 reps;Supine Hip ABduction/ADduction: AAROM;Strengthening;Right;10 reps;Supine Goniometric ROM: R knee extension 5 degrees short of neutral; R knee flexion at least to 60 degrees (limited d/t R knee pain)   Assessment/Plan    PT Assessment Patient needs continued PT services  PT Problem List Decreased strength;Decreased range of motion;Decreased activity tolerance;Decreased balance;Decreased mobility;Decreased knowledge of use of DME;Decreased knowledge of precautions;Pain;Decreased skin integrity       PT Treatment Interventions  DME instruction;Gait training;Stair training;Functional mobility training;Therapeutic activities;Therapeutic exercise;Balance training;Patient/family education    PT Goals (Current goals can be found in the Care Plan section)  Acute Rehab PT Goals Patient Stated Goal: to improve pain and mobility PT Goal Formulation: With patient Time For Goal Achievement: 01/15/20 Potential to Achieve Goals: Good    Frequency BID   Barriers to discharge Decreased caregiver support      Co-evaluation               AM-PAC PT "6 Clicks" Mobility  Outcome Measure Help needed turning from your back to your side while in a flat bed without using bedrails?: A Little Help needed moving from lying on your back to sitting on the side of a flat bed without using bedrails?: A Little Help needed moving to and from a bed to a chair (including a wheelchair)?: A Little Help needed standing up from a chair using your arms (e.g., wheelchair or bedside chair)?: A Little Help needed to walk in hospital room?: A Little Help needed climbing 3-5 steps with a railing? : A Lot 6 Click Score: 17    End of Session Equipment Utilized During Treatment: Gait belt;Oxygen (2 L O2 via nasal cannula) Activity Tolerance: Patient limited by pain;Other (comment) (limited d/t nausea) Patient left: in chair;with call bell/phone within reach;with chair alarm set;with SCD's reapplied;Other (comment) (B heels floating via towel rolls;  polar care in place and activated) Nurse Communication: Mobility status;Precautions;Weight bearing status;Other (comment) (pt's nausea and pain) PT Visit Diagnosis: Other abnormalities of gait and mobility (R26.89);Muscle weakness (generalized) (M62.81);History of falling (Z91.81);Difficulty in walking, not elsewhere classified (R26.2);Pain Pain - Right/Left: Right Pain - part of body: Knee    Time: 1549-1630 PT Time Calculation (min) (ACUTE ONLY): 41 min   Charges:   PT Evaluation $PT Eval Low  Complexity: 1 Low PT Treatments $Therapeutic Exercise: 8-22 mins $Therapeutic Activity: 8-22 mins       Leitha Bleak, PT 01/01/20, 5:13 PM

## 2020-01-01 NOTE — H&P (Signed)
Chief Complaint  Patient presents with  . Right Knee - Pain  . Knee Pain  H&P-RT TKA -11.4.21/Sophiarose Eades   Reason for Visit Erin Petersen is a 79 y.o. who presents today for history and physical. She is to undergo a right total knee arthroplasty on 01/01/2020. Last seen in the clinic on 11/12/2019. There is been no change in her condition since that time.  Patient initially presented to the clinic complaining of pain to both knees.Right greater than left. Both knees cause her pain along the medial joint line. She was seen in September 2020 and received cortisone injections to the right knee that worked great for 3 months. Couple weeks later patient had a fall on to both knees, and denies breaking anything but states just has some soreness to the medial joint line of both knees. She had been walking 2 to 3 miles a day but due to deep aching pain only able to walk about 1 mile a day. She has not tried gel injections.  03/12/2019 patient received bilateral knee cortisone injections which gave her very little relief with her right knee pain. Right knee pain is been increased due to left foot plantar fasciitis which she is being treated by podiatry. She is having to put more weight on the right knee, feeling some discomfort along the medial joint line. Has aching in the evenings along the medial joint line and feels as if the knee is wanting to give way.  Patient received Durolane injection with no improvement. Still having severe pain along the medial joint line and medial tibial plateau. She has severe pain with weightbearing activity only. Has tried tramadol with no improvement. Prior to her fall back in January she had no knee pain.   Past Medical History Past Medical History:  Diagnosis Date  . Allergy 1959  Post nasal drip  . Arthritis 1984  . Asthma 01/31/2019  . COPD (chronic obstructive pulmonary disease) (CMS-HCC) 12/29/2016  . GERD (gastroesophageal reflux disease) 10/22/2019  . History of  blood transfusion 2002  Fusion surgery  . Hypertension 25+ years  Amlodopine & HZTZ  . Neuro-degenerative disorders (CMS-HCC) 2002  . Sleep apnea 2009  Cap   Past Surgical History Past Surgical History:  Procedure Laterality Date  . SPINE SURGERY 2002  Fusion  . TONSILLECTOMY 1961   Past Family History Family History  Problem Relation Age of Onset  . Cancer Mother  Died of lung cancer. Heavy smoker 2nd hand smoke  . Cancer Father  Died of brain cancer heavy smoker   Medications Current Outpatient Medications Ordered in Epic  Medication Sig Dispense Refill  . rOPINIRole (REQUIP) 1 MG tablet Take 1 mg by mouth 2 (two) times daily 73m tablet in the morning, 469mtablet at bedtime.  . traMADoL (ULTRAM) 50 mg tablet Take by mouth  . acetaminophen (TYLENOL) 650 MG ER tablet Take by mouth  . acyclovir (ZOVIRAX) 5 % cream Apply 1 Application topically every 4 (four) hours as needed (patient uses 30g as needed.)  . alendronate (FOSAMAX) 70 MG tablet Take 1 tablet by mouth once a week  . amitriptyline (ELAVIL) 25 MG tablet Take 1 tablet by mouth once daily  . amLODIPine (NORVASC) 5 MG tablet Take 5 mg by mouth once daily  . aspirin 81 MG EC tablet Take by mouth Take 81 mg by mouth  . cholecalciferol (VITAMIN D3) 2,000 unit tablet Take by mouth Take 2,000 Units by mouth daily  . cyanocobalamin (VITAMIN B12) 1000 MCG tablet  Take by mouth Take 1,000 mcg by mouth.  . dicyclomine (BENTYL) 10 mg capsule Take 10 mg by mouth nightly  . EPINEPHrine (EPIPEN) 0.3 mg/0.3 mL pen injector Inject 0.3 mg into the muscle once as needed for Anaphylaxis  . estradioL (ESTRACE) 0.01 % (0.1 mg/gram) vaginal cream Place 2 g vaginally once daily  . fluticasone propionate (FLONASE) 50 mcg/actuation nasal spray 1 spray by Nasal route  . hydroCHLOROthiazide (HYDRODIURIL) 25 MG tablet Take 25 mg by mouth once daily  . hydrOXYzine (ATARAX) 25 MG tablet 1po one hour before sleep  . iron fum,ps-FA-vit B,C#18-Lact  (FUSION PLUS) 130 mg iron -1,250 mcg Cap Take by mouth  . linaCLOtide (LINZESS) 145 mcg capsule Take 145 mcg by mouth once daily  . melatonin 5 mg Cap Use  . meloxicam (MOBIC) 15 MG tablet Take 1 tablet by mouth once daily  . omeprazole (PRILOSEC) 40 MG DR capsule Take 40 mg by mouth once daily 60 capsule 11  . polyethylene glycol (MIRALAX) powder Take 17 g by mouth once daily  . pregabalin (LYRICA) 150 MG capsule Take 1 capsule (150 mg total) by mouth 2 (two) times daily for 30 days After initial titration 180 capsule 2  . simvastatin (ZOCOR) 20 MG tablet Take 20 mg by mouth nightly  . UNABLE TO FIND 6 g [place]. Patient uses 6g as needed   No current Epic-ordered facility-administered medications on file.   Allergies Allergies  Allergen Reactions  . Other Anaphylaxis  Bee Sting  . Ciprofloxacin Rash  . Venom-Honey Bee Hives  . Sulfa (Sulfonamide Antibiotics) Rash  Bactrim Bactrim, Cipro    Review of Systems A comprehensive 14 point ROS was performed, reviewed, and the pertinent orthopaedic findings are documented in the HPI.  Exam BP 130/80  Ht 149.9 cm (4' 11" )  Wt 73.8 kg (162 lb 9.6 oz)  LMP (LMP Unknown)  BMI 32.84 kg/m   General: Well-developed well-nourished female seen in no acute distress.   HEENT: Atraumatic,normocephalic. Pupils are equal and reactive to light. Oropharynx is clear with moist mucosa  Lungs: Clear to auscultation bilaterally   Cardiovascular: Regular rate and rhythm. Normal S1, S2. No murmurs. No appreciable gallops or rubs. Peripheral pulses are palpable.  Abdomen: Soft, non-tender, nondistended. Bowel sounds present  Extremity: Right lower Extremities: Examination of the right lower extremity reveals no bony abnormality, no edema, no effusion and no ecchymosis. There is no valgus or varus abnormality. The patient is non-tender along the lateral joint line, and is tender along the medial joint line bilaterally. The patient has full knee  flexion and extension. There is no discomfort with range of motion exercises. The patient has a negative rotational Mcmurray test. There is no retropatellar discomfort. The patient has a negative patella stretch test. The patient has a negative varus stress test and a negative valgus stress test, in looking for stability. The patient has a negative Lachman's test. Patient able to straight leg raise bilaterally.   Neurological:  The patient is alert and oriented Sensation to light touch appears to be intact and within normal limits Gross motor strength appeared to be equal to 5/5  Vascular :  Peripheral pulses felt to be palpable. Capillary refill appears to be intact and within normal limits Mild swelling or edema to the lower extremities  X-ray  1. X-rays taken on 07/16/2019 showed moderate joint space narrowing in the medial compartment with subchondral sclerosing and spurring along the medial tibial plateau. Slight varus deformity. Normal lateral compartment with  mild patellofemoral arthritic changes.  Impression  1. Degenerative arthrosis right knee  Plan   1. Patient is to discontinue her aspirin and meloxicam 1 week prior to surgery 2. Patient is fine most spending 1 night in the hospital 3. Did discuss postop course 4. Return to clinic 2 weeks postop  This note was generated in part with voice recognition software and I apologize for any typographical errors that were not detected and corrected   Watt Climes PA  Electronically signed by Regino Bellow, PA at 12/24/2019 11:41 AM EDT   Reviewed  H+P, No changes noted.

## 2020-01-01 NOTE — Anesthesia Procedure Notes (Signed)
Spinal  Patient location during procedure: OR Staffing Performed: resident/CRNA  Resident/CRNA: Fredderick Phenix, CRNA Preanesthetic Checklist Completed: patient identified, IV checked, site marked, risks and benefits discussed, surgical consent, monitors and equipment checked, pre-op evaluation and timeout performed Spinal Block Patient position: sitting Prep: DuraPrep Patient monitoring: heart rate, cardiac monitor, continuous pulse ox and blood pressure Approach: midline Location: L3-4 Injection technique: single-shot Needle Needle type: Sprotte  Needle gauge: 24 G Needle length: 9 cm Assessment Sensory level: T4

## 2020-01-02 ENCOUNTER — Encounter: Payer: Self-pay | Admitting: Orthopedic Surgery

## 2020-01-02 LAB — CBC
HCT: 33.4 % — ABNORMAL LOW (ref 36.0–46.0)
Hemoglobin: 10.6 g/dL — ABNORMAL LOW (ref 12.0–15.0)
MCH: 25.1 pg — ABNORMAL LOW (ref 26.0–34.0)
MCHC: 31.7 g/dL (ref 30.0–36.0)
MCV: 79 fL — ABNORMAL LOW (ref 80.0–100.0)
Platelets: 328 10*3/uL (ref 150–400)
RBC: 4.23 MIL/uL (ref 3.87–5.11)
RDW: 15.5 % (ref 11.5–15.5)
WBC: 9.5 10*3/uL (ref 4.0–10.5)
nRBC: 0 % (ref 0.0–0.2)

## 2020-01-02 LAB — BASIC METABOLIC PANEL
Anion gap: 7 (ref 5–15)
BUN: 8 mg/dL (ref 8–23)
CO2: 28 mmol/L (ref 22–32)
Calcium: 8.4 mg/dL — ABNORMAL LOW (ref 8.9–10.3)
Chloride: 96 mmol/L — ABNORMAL LOW (ref 98–111)
Creatinine, Ser: 0.71 mg/dL (ref 0.44–1.00)
GFR, Estimated: >60 mL/min (ref >60)
Glucose, Bld: 152 mg/dL — ABNORMAL HIGH (ref 70–99)
Potassium: 3.9 mmol/L (ref 3.5–5.1)
Sodium: 131 mmol/L — ABNORMAL LOW (ref 135–145)

## 2020-01-02 MED ORDER — OXYCODONE HCL ER 15 MG PO T12A: 15.0000 mg | EXTENDED_RELEASE_TABLET | Freq: Two times a day (BID) | ORAL | 0 refills | Status: DC

## 2020-01-02 MED ORDER — OXYCODONE HCL ER 15 MG PO T12A
15.0000 mg | EXTENDED_RELEASE_TABLET | Freq: Two times a day (BID) | ORAL | Status: DC
  Administered 2020-01-02 – 2020-01-04 (×5): 15 mg via ORAL
  Filled 2020-01-02 (×5): qty 1

## 2020-01-02 MED ORDER — ENOXAPARIN SODIUM 40 MG/0.4ML ~~LOC~~ SOLN: 40.0000 mg | SUBCUTANEOUS | 0 refills | Status: DC

## 2020-01-02 MED ORDER — HYDROCODONE-ACETAMINOPHEN 5-325 MG PO TABS
1.0000 | ORAL_TABLET | ORAL | 0 refills | Status: DC | PRN
Start: 2020-01-02 — End: 2020-02-13

## 2020-01-02 MED ORDER — DOCUSATE SODIUM 100 MG PO CAPS: 100.0000 mg | ORAL_CAPSULE | Freq: Two times a day (BID) | ORAL | 0 refills | Status: DC

## 2020-01-02 MED ORDER — TRAMADOL HCL 50 MG PO TABS
50.0000 mg | ORAL_TABLET | Freq: Four times a day (QID) | ORAL | 0 refills | Status: DC | PRN
Start: 2020-01-02 — End: 2020-10-14

## 2020-01-02 NOTE — Discharge Summary (Signed)
Physician Discharge Summary  Patient ID: Erin Petersen MRN: [id number] DOB/AGE: 09-18-1940 79 y.o.  Admit date: 01/01/2020 Discharge date: 01/04/2020  Admission Diagnoses:  S/P TKR (total knee replacement) using cement, right [Z96.651]  Discharge Diagnoses: Patient Active Problem List   Diagnosis Date Noted  . S/P TKR (total knee replacement) using cement, right 01/01/2020  . History of vaginal hysterectomy 06/11/2019  . Pyelonephritis of right kidney 06/03/2019  . Prediabetes 04/22/2019  . Estrogen deficiency 04/22/2019  . Urinary frequency 04/22/2019  . History of skin cancer 04/22/2019  . High-tone pelvic floor dysfunction 04/03/2019  . IC (interstitial cystitis) 04/03/2019  . Neuralgia of both pudendal nerves 04/03/2019  . Pelvic pain in female 04/03/2019  . Osteoporosis 02/12/2019  . Asthma 01/31/2019  . Adverse reaction to influenza vaccine, initial encounter 12/03/2018  . Vitamin D deficiency 12/03/2018  . Restless legs syndrome 11/28/2018  . Small fiber neuropathy 11/28/2018  . Allergic rhinitis 10/08/2018  . Benign essential hypertension 10/08/2018  . Chronic bladder pain 10/08/2018  . Neuropathy 10/08/2018  . Osteoarthritis 10/08/2018  . S/P BSO (bilateral salpingo-oophorectomy) 01/07/2018  . OSA on CPAP 12/29/2016  . Shortness of breath 06/24/2012  . Other and unspecified disc disorder of lumbar region 10/25/2000  . High cholesterol 09/24/1998  . Tinnitus of both ears 09/24/1983    Past Medical History:  Diagnosis Date  . Arthritis   . Asthma    exercise induced asthma  . Bacteremia due to Gram-negative bacteria 06/04/2019  . Colon polyps   . Duodenal ulcer   . Dyspnea   . GERD (gastroesophageal reflux disease)   . Hyperlipidemia   . Hypertension   . Neuropathy    right leg and foot  . Plantar fasciitis   . Pre-diabetes   . Prediabetes   . RLQ abdominal pain   . RLS (restless legs syndrome)   . Sleep apnea    CPAP  . SOB (shortness of  breath)   . Urinary incontinence   . Urinary tract infection   . Wears hearing aid in right ear      Transfusion: none   Consultants (if any):   Discharged Condition: Improved  Hospital Course: ANAIRIS KNICK is an 79 y.o. female who was admitted 01/01/2020 with a diagnosis of right knee osteoarthritis and went to the operating room on 01/01/2020 and underwent the above named procedures.    Surgeries: Procedure(s): Right total knee arthroplasty - [name] to Assist on 01/01/2020 Patient tolerated the surgery well. Taken to PACU where she was stabilized and then transferred to the orthopedic floor.  Started on Lovenox 30 milligrams q 12 hrs. Foot pumps applied bilaterally at 80 mm. Heels elevated on bed with rolled towels. No evidence of DVT. Negative Homan. Physical therapy started on day #1 for gait training and transfer. OT started day #1 for ADL and assisted devices.  Patient's foley was d/c on day #1.  On postop day 1 patient with severe pain, OxyContin 15 mg extended release was added to her regimen and throughout the day pain improved and she was able to make some progress with the afternoon therapy but unfortunately pain was so severe in the morning she could not tolerate physical therapy.  Labs and vital signs stable.  Patient was able to work more with therapy on POD2, currently recommending HHPT upon discharge.  Patient completed all the steps needed for discharge by POD3.  Implants: Medacta GM K sphere system, 3+ right femur, 3 tibia, short stem, 12 mm  insert and 2 patella, all components cemented  She was given perioperative antibiotics:  Anti-infectives (From admission, onward)   Start     Dose/Rate Route Frequency Ordered Stop   01/01/20 1330  ceFAZolin (ANCEF) IVPB 1 g/50 mL premix        1 g 100 mL/hr over 30 Minutes Intravenous Every 6 hours 01/01/20 1211 01/01/20 2037   01/01/20 0618  ceFAZolin (ANCEF) 2-4 GM/100ML-% IVPB       Note to Pharmacy: Myles Lipps    : cabinet override      01/01/20 0618 01/01/20 0752   01/01/20 0600  ceFAZolin (ANCEF) IVPB 2g/100 mL premix        2 g 200 mL/hr over 30 Minutes Intravenous On call to O.R. 01/01/20 0124 01/01/20 0747    .  She was given sequential compression devices, early ambulation, and Lovenox, teds for DVT prophylaxis.  She benefited maximally from the hospital stay and there were no complications.    Recent vital signs:  Vitals:   01/04/20 0343 01/04/20 0808  BP: 134/66 (!) 142/59  Pulse: 79 90  Resp: 18 20  Temp: (!) 97.3 F (36.3 C) 98.5 F (36.9 C)  SpO2: 90% 93%    Recent laboratory studies:  Lab Results  Component Value Date   HGB 10.6 (L) 01/02/2020   HGB 11.9 (L) 01/01/2020   HGB 12.5 12/23/2019   Lab Results  Component Value Date   WBC 9.5 01/02/2020   PLT 328 01/02/2020   No results found for: INR Lab Results  Component Value Date   NA 131 (L) 01/02/2020   K 3.9 01/02/2020   CL 96 (L) 01/02/2020   CO2 28 01/02/2020   BUN 8 01/02/2020   CREATININE 0.71 01/02/2020   GLUCOSE 152 (H) 01/02/2020    Discharge Medications:   Allergies as of 01/04/2020      Reactions   Other Anaphylaxis   Bee Sting    Ciprofloxacin Hcl Rash   Mild rash after long term use   Sulfa Antibiotics Rash   Bactrim      Medication List    TAKE these medications   acetaminophen 650 MG CR tablet Commonly known as: TYLENOL Take 1,300 mg by mouth every 8 (eight) hours as needed for pain.   albuterol 108 (90 Base) MCG/ACT inhaler Commonly known as: VENTOLIN HFA Inhale 2 puffs into the lungs every 6 (six) hours as needed for wheezing or shortness of breath.   alendronate 70 MG tablet Commonly known as: FOSAMAX Take 70 mg by mouth once a week. Take with a full glass of water on an empty stomach.   amitriptyline 25 MG tablet Commonly known as: ELAVIL TAKE 1 TABLET BY MOUTH AT  BEDTIME   amLODipine 5 MG tablet Commonly known as: NORVASC Take 1 tablet (5 mg total) by mouth  daily.   aspirin 81 MG EC tablet Take 81 mg by mouth at bedtime.   DIAZEPAM RE Place 10 mg rectally at bedtime.   diclofenac sodium 1 % Gel Commonly known as: VOLTAREN Apply 1 application topically 2 (two) times daily as needed (pain.).   dicyclomine 10 MG capsule Commonly known as: BENTYL TAKE 1 CAPSULE BY MOUTH AT  BEDTIME   docusate sodium 100 MG capsule Commonly known as: COLACE Take 1 capsule (100 mg total) by mouth 2 (two) times daily.   enoxaparin 40 MG/0.4ML injection Commonly known as: Lovenox Inject 0.4 mLs (40 mg total) into the skin daily for 14 days.  EPINEPHrine 0.3 mg/0.3 mL Soaj injection Commonly known as: EPI-PEN Inject 0.3 mLs (0.3 mg total) into the muscle as needed for anaphylaxis.   estradiol 0.1 MG/GM vaginal cream Commonly known as: ESTRACE Place 1 Applicatorful vaginally at bedtime.   fluticasone 50 MCG/ACT nasal spray Commonly known as: FLONASE Place 2 sprays into both nostrils daily. What changed: how much to take   Fusion Plus Caps Take 1 capsule by mouth daily.   hydrochlorothiazide 25 MG tablet Commonly known as: HYDRODIURIL Take 25 mg by mouth daily.   HYDROcodone-acetaminophen 5-325 MG tablet Commonly known as: NORCO/VICODIN Take 1-2 tablets by mouth every 4 (four) hours as needed for moderate pain (pain score 4-6).   hydrOXYzine 25 MG tablet Commonly known as: ATARAX/VISTARIL Take 25 mg by mouth at bedtime.   Linzess 145 MCG Caps capsule Generic drug: linaclotide TAKE 1 CAPSULE BY MOUTH  DAILY BEFORE BREAKFAST What changed: See the new instructions.   Melatonin 10 MG Tabs Take 10 mg by mouth at bedtime.   meloxicam 15 MG tablet Commonly known as: MOBIC Take 15 mg by mouth daily.   omeprazole 40 MG capsule Commonly known as: PRILOSEC Take 1 capsule (40 mg total) by mouth daily before breakfast.   oxyCODONE 15 mg 12 hr tablet Commonly known as: OXYCONTIN Take 1 tablet (15 mg total) by mouth every 12 (twelve)  hours.   polyethylene glycol 17 g packet Commonly known as: MIRALAX / GLYCOLAX Take 1 packet by mouth daily.   pregabalin 150 MG capsule Commonly known as: LYRICA Take 1 capsule (150 mg total) by mouth 2 (two) times daily.   rOPINIRole 1 MG tablet Commonly known as: REQUIP Take 1-4 mg by mouth See admin instructions. Take 1 mg by mouth in the morning and 4 mg at bedtime   simvastatin 20 MG tablet Commonly known as: ZOCOR Take 0.5 tablets (10 mg total) by mouth at bedtime. What changed: how much to take   traMADol 50 MG tablet Commonly known as: ULTRAM Take 1 tablet (50 mg total) by mouth every 6 (six) hours as needed. What changed: reasons to take this   Vitamin B-12 3000 MCG Subl Take 3,000 mcg by mouth daily.   Vitamin D3 50 MCG (2000 UT) Tabs Take 2,000 Units by mouth daily.            Durable Medical Equipment  (From admission, onward)         Start     Ordered   01/01/20 1212  DME Walker rolling  Once       Question Answer Comment  Walker: With [place] Wheels   Patient needs a walker to treat with the following condition S/P TKR (total knee replacement) using cement, right      01/01/20 1211   01/01/20 1212  DME 3 n 1  Once        01/01/20 1211   01/01/20 1212  DME Bedside commode  Once       Question:  Patient needs a bedside commode to treat with the following condition  Answer:  S/P TKR (total knee replacement) using cement, right   01/01/20 1211          Diagnostic Studies: DG Knee 1-2 Views Right  Result Date: 01/01/2020 CLINICAL DATA:  Post knee replacement EXAM: RIGHT KNEE - 1-2 VIEW COMPARISON:  None. FINDINGS: Postoperative changes of right total knee arthroplasty. There is postoperative soft tissue swelling and air. No acute fracture or malalignment. IMPRESSION: Standard postoperative appearance of  right total knee arthroplasty. Electronically Signed   By: Macy Mis M.D.   On: 01/01/2020 10:05   DG Chest Port 1 View  Result Date:  01/03/2020 CLINICAL DATA:  Wheezing, asthma EXAM: PORTABLE CHEST 1 VIEW COMPARISON:  06/03/2019 chest radiograph. FINDINGS: Stable cardiomediastinal silhouette with mild cardiomegaly. No pneumothorax. No pleural effusion. No overt pulmonary edema. Mild elevation of the right hemidiaphragm. No acute consolidative airspace disease. Mild medial left lung base scarring. IMPRESSION: Stable mild cardiomegaly without overt pulmonary edema. Mild medial left lung base scarring. Electronically Signed   By: Ilona Sorrel M.D.   On: 01/03/2020 15:54   Disposition: Discharge home today pending a bowel movement.    Follow-up Information    Duanne Guess, PA-C Follow up in 2 week(s).   Specialties: Orthopedic Surgery, Emergency Medicine Contact information: [place] [address] [phone]              Signed: Judson Roch PA-C 01/04/2020, 9:06 AM

## 2020-01-02 NOTE — Discharge Instructions (Signed)
TOTAL KNEE REPLACEMENT POSTOPERATIVE DIRECTIONS  Knee Rehabilitation, Guidelines Following Surgery  Results after knee surgery are often greatly improved when you follow the exercise, range of motion and muscle strengthening exercises prescribed by your doctor. Safety measures are also important to protect the knee from further injury. Any time any of these exercises cause you to have increased pain or swelling in your knee joint, decrease the amount until you are comfortable again and slowly increase them. If you have problems or questions, call your caregiver or physical therapist for advice.   HOME CARE INSTRUCTIONS  Remove items at home which could result in a fall. This includes throw rugs or furniture in walking pathways.   Continue to use polar care unit on the knee for pain and swelling from surgery. You may notice swelling that will progress down to the foot and ankle.  This is normal after surgery.  Elevate the leg when you are not up walking on it.    Continue to use the breathing machine which will help keep your temperature down.  It is common for your temperature to cycle up and down following surgery, especially at night when you are not up moving around and exerting yourself.  The breathing machine keeps your lungs expanded and your temperature down.  Do not place pillow under knee, focus on keeping the knee STRAIGHT while resting  DIET You may resume your previous home diet once your are discharged from the hospital.  DRESSING / WOUND CARE / SHOWERING Please remove provena negative pressure dressing on 01/10/2020 and apply honey comb dressing. Keep dressing clean and dry at all times.   ACTIVITY Walk with your walker as instructed.  Pain Scale Information, Adult A pain scale is a tool to help you describe your pain. A pain scale often uses pictures, numbers, or words. It can help you explain to your health care provider:  What your pain feels like, such as dull, achy,  throbbing, or sharp.  Where pain is located in your body.  How often you have pain. Pain scales range from simple to complex. Which pain scale your health care provider uses depends on your condition. Some pain scales only measure pain intensity. These can be useful if the cause of your pain is known. Other pain scales measure more factors, including if you are able to do your usual activities and how the pain is affecting your mood. These scales are useful if you have long-term (chronic) pain. How is a pain scale used? A pain scale may be used in your health care provider's office or in the hospital. Pain scales for adults are usually in the form of a survey. Your health care provider will ask you the questions on the pain scale or have you fill out a form. Your health care provider may also give you a pain scale to use at home. If you have chronic pain, you may use a pain scale for several weeks or months. Keeping a record of your pain symptoms helps your health care provider see how your pain changes over time. Your health care provider can use a pain scale rating to guide your treatment plan. Why is it important to communicate about pain? Being in pain can make you feel unwell and have negative feelings. It can interfere with your daily activities, such as work, school, hobbies, or relationships. Pain can be a sign you have a condition that needs to be treated. A pain scale can help you describe your  pain so your health care provider has a better idea of what you are feeling and how to treat your condition. What are some questions to ask my health care provider?  How accurate are the results of this pain scale?  How often should I use a pain scale?  What is causing my pain?  How long will I need treatment?  What are the risks of treatment with medicines?  What other treatments can help?  What if my pain does not go away with treatment?  Should I keep a record of pain symptoms or pain  scale results at home? This information is not intended to replace advice given to you by your health care provider. Make sure you discuss any questions you have with your health care provider. Document Revised: 03/01/2016 Document Reviewed: 02/28/2015 Elsevier Patient Education  [place].  Use walker as long as suggested by your caregivers. Avoid periods of inactivity such as sitting longer than an hour when not asleep. This helps prevent blood clots.  You may resume a sexual relationship in one month or when given the OK by your doctor.  You may return to work once you are cleared by your doctor.  Do not drive a car for 6 weeks or until released by you surgeon.  Do not drive while taking narcotics.  WEIGHT BEARING Weight bearing as tolerated with assist device (walker, cane, etc) as directed, use it as long as suggested by your surgeon or therapist, typically at least 4-6 weeks.  POSTOPERATIVE CONSTIPATION PROTOCOL Constipation - defined medically as fewer than three stools per week and severe constipation as less than one stool per week.  One of the most common issues patients have following surgery is constipation.  Even if you have a regular bowel pattern at home, your normal regimen is likely to be disrupted due to multiple reasons following surgery.  Combination of anesthesia, postoperative narcotics, change in appetite and fluid intake all can affect your bowels.  In order to avoid complications following surgery, here are some recommendations in order to help you during your recovery period.  Colace (docusate) - Pick up an over-the-counter form of Colace or another stool softener and take twice a day as long as you are requiring postoperative pain medications.  Take with a full glass of water daily.  If you experience loose stools or diarrhea, hold the colace until you stool forms back up.  If your symptoms do not get better within 1 week or if they get worse, check with your  doctor.  Dulcolax (bisacodyl) - Pick up over-the-counter and take as directed by the product packaging as needed to assist with the movement of your bowels.  Take with a full glass of water.  Use this product as needed if not relieved by Colace only.   MiraLax (polyethylene glycol) - Pick up over-the-counter to have on hand.  MiraLax is a solution that will increase the amount of water in your bowels to assist with bowel movements.  Take as directed and can mix with a glass of water, juice, soda, coffee, or tea.  Take if you go more than two days without a movement. Do not use MiraLax more than once per day. Call your doctor if you are still constipated or irregular after using this medication for 7 days in a row.  If you continue to have problems with postoperative constipation, please contact the office for further assistance and recommendations.  If you experience "the worst abdominal pain  ever" or develop nausea or vomiting, please contact the office immediatly for further recommendations for treatment.  ITCHING  If you experience itching with your medications, try taking only a single pain pill, or even half a pain pill at a time.  You can also use Benadryl over the counter for itching or also to help with sleep.   TED HOSE STOCKINGS Wear the elastic stockings on both legs for six weeks following surgery during the day but you may remove then at night for sleeping.  MEDICATIONS See your medication summary on the "After Visit Summary" that the nursing staff will review with you prior to discharge.  You may have some home medications which will be placed on hold until you complete the course of blood thinner medication.  It is important for you to complete the blood thinner medication as prescribed by your surgeon.  Continue your approved medications as instructed at time of discharge.  PRECAUTIONS If you experience chest pain or shortness of breath - call 911 immediately for transfer to the  hospital emergency department.  If you develop a fever greater that 101 F, purulent drainage from wound, increased redness or drainage from wound, foul odor from the wound/dressing, or calf pain - CONTACT YOUR SURGEON.                                                   FOLLOW-UP APPOINTMENTS Make sure you keep all of your appointments after your operation with your surgeon and caregivers. You should call the office at the above phone number and make an appointment for approximately two weeks after the date of your surgery or on the date instructed by your surgeon outlined in the "After Visit Summary".   RANGE OF MOTION AND STRENGTHENING EXERCISES  Rehabilitation of the knee is important following a knee injury or an operation. After just a few days of immobilization, the muscles of the thigh which control the knee become weakened and shrink (atrophy). Knee exercises are designed to build up the tone and strength of the thigh muscles and to improve knee motion. Often times heat used for twenty to thirty minutes before working out will loosen up your tissues and help with improving the range of motion but do not use heat for the first two weeks following surgery. These exercises can be done on a training (exercise) mat, on the floor, on a table or on a bed. Use what ever works the best and is most comfortable for you Knee exercises include:  Leg Lifts - While your knee is still immobilized in a splint or cast, you can do straight leg raises. Lift the leg to 60 degrees, hold for 3 sec, and slowly lower the leg. Repeat 10-20 times 2-3 times daily. Perform this exercise against resistance later as your knee gets better.  Quad and Hamstring Sets - Tighten up the muscle on the front of the thigh (Quad) and hold for 5-10 sec. Repeat this 10-20 times hourly. Hamstring sets are done by pushing the foot backward against an object and holding for 5-10 sec. Repeat as with quad sets.   Leg Slides: Lying on your back,  slowly slide your foot toward your buttocks, bending your knee up off the floor (only go as far as is comfortable). Then slowly slide your foot back down until your leg is flat  on the floor again.  Angel Wings: Lying on your back spread your legs to the side as far apart as you can without causing discomfort.  A rehabilitation program following serious knee injuries can speed recovery and prevent re-injury in the future due to weakened muscles. Contact your doctor or a physical therapist for more information on knee rehabilitation.   IF YOU ARE TRANSFERRED TO A SKILLED REHAB FACILITY If the patient is transferred to a skilled rehab facility following release from the hospital, a list of the current medications will be sent to the facility for the patient to continue.  When discharged from the skilled rehab facility, please have the facility set up the patient's [place] prior to being released. Also, the skilled facility will be responsible for providing the patient with their medications at time of release from the facility to include their pain medication, the muscle relaxants, and their blood thinner medication. If the patient is still at the rehab facility at time of the two week follow up appointment, the skilled rehab facility will also need to assist the patient in arranging follow up appointment in our office and any transportation needs.  MAKE SURE YOU:  Understand these instructions.  Get help right away if you are not doing well or get worse.

## 2020-01-02 NOTE — NC FL2 (Signed)
[place] LEVEL OF CARE SCREENING TOOL     IDENTIFICATION  Patient Name: Erin Petersen Birthdate: Jul 17, 1940 Sex: female Admission Date (Current Location): 01/01/2020  [place] and [place] Number:  Engineering geologist and Address:  [facility], [address] [place], [place] [address]      Provider Number: [id number]  Attending Physician Name and Address:  Hessie Knows, MD  Relative Name and Phone Number:  Lilyan Gilford [phone]    Current Level of Care: Hospital Recommended Level of Care: [place] Prior Approval Number:    Date Approved/Denied:   PASRR Number: [id number]  Discharge Plan: SNF    Current Diagnoses: Patient Active Problem List   Diagnosis Date Noted  . S/P TKR (total knee replacement) using cement, right 01/01/2020  . History of vaginal hysterectomy 06/11/2019  . Pyelonephritis of right kidney 06/03/2019  . Prediabetes 04/22/2019  . Estrogen deficiency 04/22/2019  . Urinary frequency 04/22/2019  . History of skin cancer 04/22/2019  . High-tone pelvic floor dysfunction 04/03/2019  . IC (interstitial cystitis) 04/03/2019  . Neuralgia of both pudendal nerves 04/03/2019  . Pelvic pain in female 04/03/2019  . Osteoporosis 02/12/2019  . Asthma 01/31/2019  . Adverse reaction to influenza vaccine, initial encounter 12/03/2018  . Vitamin D deficiency 12/03/2018  . Restless legs syndrome 11/28/2018  . Small fiber neuropathy 11/28/2018  . Allergic rhinitis 10/08/2018  . Benign essential hypertension 10/08/2018  . Chronic bladder pain 10/08/2018  . Neuropathy 10/08/2018  . Osteoarthritis 10/08/2018  . S/P BSO (bilateral salpingo-oophorectomy) 01/07/2018  . OSA on CPAP 12/29/2016  . Shortness of breath 06/24/2012  . Other and unspecified disc disorder of lumbar region 10/25/2000  . High cholesterol 09/24/1998  . Tinnitus of both ears 09/24/1983    Orientation RESPIRATION BLADDER Height &  Weight     Self, Time, Situation, Place  Normal External catheter Weight: 73 kg Height:  4' 11"  (149.9 cm)  BEHAVIORAL SYMPTOMS/MOOD NEUROLOGICAL BOWEL NUTRITION STATUS      Continent Diet (Regular)  AMBULATORY STATUS COMMUNICATION OF NEEDS Skin   Extensive Assist Verbally Surgical wounds                       Personal Care Assistance Level of Assistance  Dressing, Feeding, Bathing Bathing Assistance: Limited assistance Feeding assistance: Independent Dressing Assistance: Limited assistance     Functional Limitations Info  Sight, Hearing, Speech Sight Info:  (glasses) Hearing Info: Adequate Speech Info: Adequate    SPECIAL CARE FACTORS FREQUENCY  PT (By licensed PT), OT (By licensed OT)                    Contractures Contractures Info: Not present    Additional Factors Info  Code Status, Allergies Code Status Info: Full Allergies Info: Bee Sting, Sulfa, Ciprofloxacin           Current Medications (01/02/2020):  This is the current hospital active medication list Current Facility-Administered Medications  Medication Dose Route Frequency Provider Last Rate Last Admin  . 0.9 %  sodium chloride infusion   Intravenous Continuous Hessie Knows, MD 75 mL/hr at 01/02/20 0400 Rate Verify at 01/02/20 0400  . acetaminophen (TYLENOL) tablet 325-650 mg  325-650 mg Oral Q6H PRN Hessie Knows, MD      . albuterol (VENTOLIN HFA) 108 (90 Base) MCG/ACT inhaler 2 puff  2 puff Inhalation Q6H PRN Hessie Knows, MD      . alum & mag hydroxide-simeth (MAALOX/MYLANTA)  200-200-20 MG/5ML suspension 30 mL  30 mL Oral Q4H PRN Hessie Knows, MD      . amitriptyline (ELAVIL) tablet 25 mg  25 mg Oral QHS Hessie Knows, MD   25 mg at 01/01/20 2001  . amLODipine (NORVASC) tablet 5 mg  5 mg Oral Daily Hessie Knows, MD   5 mg at 01/02/20 0939  . aspirin EC tablet 81 mg  81 mg Oral QHS Hessie Knows, MD   81 mg at 01/01/20 2001  . bisacodyl (DULCOLAX) suppository 10 mg  10 mg Rectal Daily  PRN Hessie Knows, MD      . Chlorhexidine Gluconate Cloth 2 % PADS 6 each  6 each Topical Daily Hessie Knows, MD   6 each at 01/02/20 1000  . cholecalciferol (VITAMIN D) tablet 2,000 Units  2,000 Units Oral Daily Hessie Knows, MD   2,000 Units at 01/02/20 [phone]  . dicyclomine (BENTYL) capsule 10 mg  10 mg Oral QHS Hessie Knows, MD   10 mg at 01/01/20 2002  . docusate sodium (COLACE) capsule 100 mg  100 mg Oral BID Hessie Knows, MD   100 mg at 01/02/20 0939  . enoxaparin (LOVENOX) injection 30 mg  30 mg Subcutaneous Q12H Hessie Knows, MD   30 mg at 01/02/20 0800  . estradiol (ESTRACE) vaginal cream 1 Applicatorful  1 Applicatorful Vaginal QHS Hessie Knows, MD   1 Applicatorful at 40/76/80 2003  . fluticasone (FLONASE) 50 MCG/ACT nasal spray 1 spray  1 spray Each Nare Daily Hessie Knows, MD   1 spray at 01/02/20 0949  . hydrochlorothiazide (HYDRODIURIL) tablet 25 mg  25 mg Oral Daily Hessie Knows, MD   25 mg at 01/02/20 0939  . HYDROcodone-acetaminophen (NORCO) 7.5-325 MG per tablet 1-2 tablet  1-2 tablet Oral Q4H PRN Hessie Knows, MD   2 tablet at 01/02/20 0403  . HYDROcodone-acetaminophen (NORCO/VICODIN) 5-325 MG per tablet 1-2 tablet  1-2 tablet Oral Q4H PRN Hessie Knows, MD   1 tablet at 01/02/20 1314  . hydrOXYzine (ATARAX/VISTARIL) tablet 25 mg  25 mg Oral QHS Hessie Knows, MD   25 mg at 01/01/20 2003  . linaclotide (LINZESS) capsule 145 mcg  145 mcg Oral QAC breakfast Hessie Knows, MD   145 mcg at 01/02/20 0800  . magnesium citrate solution 1 Bottle  1 Bottle Oral Once PRN Hessie Knows, MD      . magnesium hydroxide (MILK OF MAGNESIA) suspension 30 mL  30 mL Oral Daily PRN Hessie Knows, MD      . melatonin tablet 5 mg  5 mg Oral QHS Hessie Knows, MD   5 mg at 01/01/20 2001  . menthol-cetylpyridinium (CEPACOL) lozenge 3 mg  1 lozenge Oral PRN Hessie Knows, MD       Or  . phenol (CHLORASEPTIC) mouth spray 1 spray  1 spray Mouth/Throat PRN Hessie Knows, MD      . methocarbamol  (ROBAXIN) tablet 500 mg  500 mg Oral Q6H PRN Hessie Knows, MD   500 mg at 01/02/20 0800   Or  . methocarbamol (ROBAXIN) 500 mg in dextrose 5 % 50 mL IVPB  500 mg Intravenous Q6H PRN Hessie Knows, MD      . metoCLOPramide (REGLAN) tablet 5-10 mg  5-10 mg Oral Q8H PRN Hessie Knows, MD       Or  . metoCLOPramide (REGLAN) injection 5-10 mg  5-10 mg Intravenous Q8H PRN Hessie Knows, MD      . morphine 2 MG/ML injection 0.5-1 mg  0.5-1  mg Intravenous Q2H PRN Hessie Knows, MD   1 mg at 01/02/20 0942  . ondansetron (ZOFRAN) tablet 4 mg  4 mg Oral Q6H PRN Hessie Knows, MD   4 mg at 01/01/20 1617   Or  . ondansetron (ZOFRAN) injection 4 mg  4 mg Intravenous Q6H PRN Hessie Knows, MD   4 mg at 01/02/20 0749  . oxyCODONE (OXYCONTIN) 12 hr tablet 15 mg  15 mg Oral Q12H Hessie Knows, MD   15 mg at 01/02/20 0748  . pantoprazole (PROTONIX) EC tablet 80 mg  80 mg Oral Daily Hessie Knows, MD   80 mg at 01/02/20 0939  . polyethylene glycol (MIRALAX / GLYCOLAX) packet 17 g  1 packet Oral Daily Hessie Knows, MD   17 g at 01/02/20 1000  . pregabalin (LYRICA) capsule 150 mg  150 mg Oral BID Hessie Knows, MD   150 mg at 01/02/20 0939  . rOPINIRole (REQUIP) tablet 1 mg  1 mg Oral Daily Hessie Knows, MD   1 mg at 01/02/20 0939  . rOPINIRole (REQUIP) tablet 4 mg  4 mg Oral QHS Hessie Knows, MD   4 mg at 01/01/20 2002  . simvastatin (ZOCOR) tablet 20 mg  20 mg Oral QHS Hessie Knows, MD   20 mg at 01/01/20 2002  . traMADol (ULTRAM) tablet 50 mg  50 mg Oral Q6H Hessie Knows, MD   50 mg at 01/02/20 1131  . vitamin B-12 (CYANOCOBALAMIN) tablet 3,000 mcg  3,000 mcg Oral Daily Hessie Knows, MD   3,000 mcg at 01/02/20 5974  . zolpidem (AMBIEN) tablet 5 mg  5 mg Oral QHS PRN Hessie Knows, MD         Discharge Medications: Please see discharge summary for a list of discharge medications.  Relevant Imaging Results:  Relevant Lab Results:   Additional Information SS# [id number]  Shelbie Ammons, RN

## 2020-01-02 NOTE — Evaluation (Signed)
Occupational Therapy Evaluation Patient Details Name: Erin Petersen MRN: [id number] DOB: 11-29-1940 Today's Date: 01/02/2020    History of Present Illness Pt is a 79 y.o. female s/p R TKA 11/4 secondary primary OA.  PMH includes asthma, COPD, htn, sleep apnea, neuro-degenerative disorders, spine fusion 2002, neuropathy R leg and foot, RLS.   Clinical Impression   Pt seen for OT evaluation this date, POD#1 from above surgery. Pt was independent in all ADL prior to surgery and is eager to return to PLOF with less pain and improved safety and independence. Pt currently requires MOD A for LB dressing and bathing while in seated position, Min A for ADL transfers, CGA with RW for limited ADL mobility, and Min A for bed mobility due to significant pain and muscle spasms as well as limited AROM of R knee. Pt instructed in home/routines modifications, DME/AE for LB bathing and dressing tasks, and RW mgt during ADL tasks and ADL transfers. Handout provided. Pt would benefit from skilled OT services including additional instruction in dressing techniques with or without assistive devices for dressing and bathing skills to support recall and carryover prior to discharge and ultimately to maximize safety, independence, and minimize falls risk and caregiver burden. Given current functional deficits, recommending SNF at this time. Will continue to work towards pt's goal of return home with assist and will update discharge recommendation accordingly pending pt's progress.       Follow Up Recommendations  SNF    Equipment Recommendations  3 in 1 bedside commode    Recommendations for Other Services       Precautions / Restrictions Precautions Precautions: Fall;Knee Precaution Comments: wound vac Restrictions Weight Bearing Restrictions: Yes RLE Weight Bearing: Weight bearing as tolerated      Mobility Bed Mobility Overal bed mobility: Needs Assistance Bed Mobility: Supine to Sit     Supine to  sit: Min assist;HOB elevated     General bed mobility comments: Increased time/effort, significant pain, Min A for RLE mgt    Transfers Overall transfer level: Needs assistance Equipment used: Rolling walker (2 wheeled) Transfers: Sit to/from Stand Sit to Stand: Min assist         General transfer comment: VC for RW mgt, hand/foot placement    Balance Overall balance assessment: Needs assistance Sitting-balance support: No upper extremity supported;Feet supported Sitting balance-Leahy Scale: Fair Sitting balance - Comments: pain limited   Standing balance support: Bilateral upper extremity supported Standing balance-Leahy Scale: Poor Standing balance comment: heavy reliance on BUE on RW 2/2 pain                           ADL either performed or assessed with clinical judgement   ADL Overall ADL's : Needs assistance/impaired                                       General ADL Comments: Mod A for LB ADL tasks, set up and supervision for seated UB ADL tasks, Min A for ADL transfers, set up and SBA for pericare with lateral lean from [place] Patient Visual Report: No change from baseline       Perception     Praxis      Pertinent Vitals/Pain Pain Assessment: 0-10 Pain Score: 7  Pain Location: R knee at rest, up to 10/10 with muscle spasms Pain Descriptors /  Indicators: Aching;Sore;Tender;Operative site guarding Pain Intervention(s): Limited activity within patient's tolerance;Monitored during session;Premedicated before session;Repositioned;Ice applied;Patient requesting pain meds-RN notified     Hand Dominance Right   Extremity/Trunk Assessment Upper Extremity Assessment Upper Extremity Assessment: Overall WFL for tasks assessed   Lower Extremity Assessment Lower Extremity Assessment: RLE deficits/detail;Defer to PT evaluation RLE: Unable to fully assess due to pain       Communication Communication Communication: HOH  (Hearing aide R ear)   Cognition Arousal/Alertness: Awake/alert Behavior During Therapy: WFL for tasks assessed/performed Overall Cognitive Status: Within Functional Limits for tasks assessed                                     General Comments       Exercises Other Exercises Other Exercises: Pt/dtr instructed in home/routines modifications, falls prevention, RW mgt for ADL transfers, AE for LB ADL; would benefit from further instruction and teach back   Shoulder Instructions      Home Living Family/patient expects to be discharged to:: Private residence Living Arrangements: Alone   Type of Home: House Home Access: Stairs to enter [employer] of Steps: 1 Entrance Stairs-Rails: None Home Layout: One level     Bathroom Shower/Tub: Occupational psychologist: Handicapped height     Home Equipment: [place];Shower seat - built in;Walker - 2 wheels;Cane - single point;Shower seat   Additional Comments: has BSC frame but no bucket      Prior Functioning/Environment Level of Independence: Independent        Comments: 3 falls in past 6 months        OT Problem List: Decreased strength;Decreased range of motion;Pain;Impaired balance (sitting and/or standing);Decreased knowledge of use of DME or AE;Decreased knowledge of precautions      OT Treatment/Interventions: Self-care/ADL training;Therapeutic exercise;Therapeutic activities;DME and/or AE instruction;Patient/family education;Balance training    OT Goals(Current goals can be found in the care plan section) Acute Rehab OT Goals Patient Stated Goal: to improve pain and mobility OT Goal Formulation: With patient/family Time For Goal Achievement: 01/16/20 Potential to Achieve Goals: Good ADL Goals Pt Will Perform Lower Body Dressing: sit to/from stand;with min guard assist Pt Will Transfer to Toilet: with min guard assist;ambulating;bedside commode (LRAD for  amb) Additional ADL Goal #1: Pt will independently instruct family/caregiver in compression stocking and polar care mgt Additional ADL Goal #2: Pt will verbalize plan to implement at least 1 learned cognitive behavioral pain coping strategy to maximize pain mgt during recovery.  OT Frequency: Min 2X/week   Barriers to D/C:            Co-evaluation              AM-PAC OT "6 Clicks" Daily Activity     Outcome Measure Help from another person eating meals?: None Help from another person taking care of personal grooming?: None Help from another person toileting, which includes using toliet, bedpan, or urinal?: A Little Help from another person bathing (including washing, rinsing, drying)?: A Lot Help from another person to put on and taking off regular upper body clothing?: None Help from another person to put on and taking off regular lower body clothing?: A Lot 6 Click Score: 19   End of Session Equipment Utilized During Treatment: Gait belt;Rolling walker Nurse Communication: Patient requests pain meds;Mobility status;Other (comment) (nasal cannula removed, room air, SpO2 >96%)  Activity Tolerance: Patient tolerated treatment well;Patient  limited by pain Patient left: Other (comment) (seated EOB with PT)  OT Visit Diagnosis: Other abnormalities of gait and mobility (R26.89);Pain Pain - Right/Left: Right Pain - part of body: Knee                Time: 1683-7290 OT Time Calculation (min): 46 min Charges:  OT General Charges $OT Visit: 1 Visit OT Evaluation $OT Eval Moderate Complexity: 1 Mod OT Treatments $Self Care/Home Management : 23-37 mins $Therapeutic Activity: 8-22 mins  Jeni Salles, MPH, MS, OTR/L ascom [phone] 01/02/20, 10:14 AM

## 2020-01-02 NOTE — Progress Notes (Signed)
Physical Therapy Treatment Patient Details Name: Erin Petersen MRN: [id number] DOB: 01-14-1941 Today's Date: 01/02/2020    History of Present Illness Pt is a 79 y.o. female s/p R TKA 11/4 secondary primary OA.  PMH includes asthma, COPD, htn, sleep apnea, neuro-degenerative disorders, spine fusion 2002, neuropathy R leg and foot, RLS.    PT Comments    Pt resting in bed upon PT arrival and reporting feeling better than this morning.  Tolerated LE ex's in bed with assist for R LE.  Min assist semi-supine to sitting edge of bed; min assist with transfers with RW; and CGA to min assist to ambulate a few feet bed to recliner with RW.  Limited activity d/t R knee pain (7/10 at rest beginning/end of session but increased pain noted with activity).  R knee flexion ROM limited d/t significant R knee pain.  Will continue to focus on strengthening, ROM, and progressive functional mobility per pt tolerance.   Follow Up Recommendations  SNF     Equipment Recommendations  Rolling walker with 5" wheels;3in1 (PT) (youth sized)    Recommendations for Other Services OT consult     Precautions / Restrictions Precautions Precautions: Fall;Knee Precaution Booklet Issued: Yes (comment) Precaution Comments: wound vac Restrictions Weight Bearing Restrictions: Yes RLE Weight Bearing: Weight bearing as tolerated    Mobility  Bed Mobility Overal bed mobility: Needs Assistance Bed Mobility: Supine to Sit     Supine to sit: Min assist;HOB elevated     General bed mobility comments: assist for R LE; increased time/effort to perform; vc's for technique  Transfers Overall transfer level: Needs assistance Equipment used: Rolling walker (2 wheeled) Transfers: Sit to/from Stand Sit to Stand: Min assist         General transfer comment: vc's for UE/LE placement and overall technique  Ambulation/Gait Ambulation/Gait assistance: Min guard;Min assist Gait Distance (Feet): 3 Feet (bed to  recliner) Assistive device: Rolling walker (2 wheeled)   Gait velocity: decreased   General Gait Details: antalgic; decreased stance time R LE; vc's for walker use   Stairs             Wheelchair Mobility    Modified Rankin (Stroke Patients Only)       Balance Overall balance assessment: Needs assistance Sitting-balance support: No upper extremity supported;Feet supported Sitting balance-Leahy Scale: Good Sitting balance - Comments: steady sitting reaching within BOS   Standing balance support: Single extremity supported Standing balance-Leahy Scale: Poor Standing balance comment: pt requiring at least single UE support for static standing balance                            Cognition Arousal/Alertness: Awake/alert Behavior During Therapy: WFL for tasks assessed/performed Overall Cognitive Status: Within Functional Limits for tasks assessed                                        Exercises Total Joint Exercises Ankle Circles/Pumps: AROM;Strengthening;Both;10 reps;Supine Quad Sets: AROM;Strengthening;Both;10 reps;Supine Heel Slides: AAROM;Strengthening;Right;10 reps;Supine Hip ABduction/ADduction: AAROM;Strengthening;Right;10 reps;Supine Long Arc Quad: AAROM;Strengthening;Right;10 reps;Seated Knee Flexion: AROM;Strengthening;Right;10 reps;Seated (limited flexion ROM noted) Goniometric ROM: R knee extension 5 degrees short of neutral; R knee flexion at least to 65 degrees (limited d/t R knee pain) General Exercises - Lower Extremity Hip Flexion/Marching: AAROM;Strengthening;Right;10 reps;Seated    General Comments General comments (skin integrity, edema, etc.): R LE dressings  and wound vac in place.  Pt agreeable to PT session.      Pertinent Vitals/Pain Pain Assessment: 0-10 Pain Score: 7  Pain Location: R knee Pain Descriptors / Indicators: Aching;Sore;Tender;Operative site guarding Pain Intervention(s): Limited activity within  patient's tolerance;Monitored during session;Premedicated before session;Repositioned;Other (comment) (polar care applied and activated)  Vitals (HR and O2 on room air) stable and WFL throughout treatment session.    Home Living     Prior Function        PT Goals (current goals can now be found in the care plan section) Acute Rehab PT Goals Patient Stated Goal: to improve pain and mobility PT Goal Formulation: With patient Time For Goal Achievement: 01/15/20 Potential to Achieve Goals: Good Progress towards PT goals: Progressing toward goals    Frequency    BID      PT Plan Current plan remains appropriate    Co-evaluation              AM-PAC PT "6 Clicks" Mobility   Outcome Measure  Help needed turning from your back to your side while in a flat bed without using bedrails?: A Little Help needed moving from lying on your back to sitting on the side of a flat bed without using bedrails?: A Little Help needed moving to and from a bed to a chair (including a wheelchair)?: A Little Help needed standing up from a chair using your arms (e.g., wheelchair or bedside chair)?: A Little Help needed to walk in hospital room?: A Little Help needed climbing 3-5 steps with a railing? : A Lot 6 Click Score: 17    End of Session Equipment Utilized During Treatment: Gait belt Activity Tolerance: Patient limited by pain Patient left: in chair;with call bell/phone within reach;with chair alarm set;with nursing/sitter in room;with SCD's reapplied;Other (comment) (B heels floating via towel rolls; polar care in place and activated) Nurse Communication: Mobility status;Precautions;Weight bearing status;Other (comment) (pt's pain status) PT Visit Diagnosis: Other abnormalities of gait and mobility (R26.89);Muscle weakness (generalized) (M62.81);History of falling (Z91.81);Difficulty in walking, not elsewhere classified (R26.2);Pain Pain - Right/Left: Right Pain - part of body: Knee      Time: 3762-8315 PT Time Calculation (min) (ACUTE ONLY): 33 min  Charges:  $Therapeutic Exercise: 8-22 mins $Therapeutic Activity: 8-22 mins                     Leitha Bleak, PT 01/02/20, 1:24 PM

## 2020-01-02 NOTE — Progress Notes (Signed)
Physical Therapy Treatment Patient Details Name: Erin Petersen MRN: [id number] DOB: [birth year] Today's Date: 01/02/2020    History of Present Illness Pt is a 79 y.o. female s/p R TKA 11/4 secondary primary OA.  PMH includes asthma, COPD, htn, sleep apnea, neuro-degenerative disorders, spine fusion 2002, neuropathy R leg and foot, RLS.    PT Comments    Pt on BSC with NT present upon PT arrival.  Pt reporting 7-8/10 R knee pain beginning, during, and end of session.  Able to progress to ambulating 12 feet x2 with RW CGA (limited distance ambulating d/t R knee pain and fatigue).  Pt would like to go home if possible.  Will continue to progress pt with strengthening, ROM, and progressive functional mobility per pt tolerance.  Will monitor pt's status and update PT recommendations as appropriate.    Follow Up Recommendations  SNF (pending pt's progress)     Equipment Recommendations  Rolling walker with 5" wheels;3in1 (PT) (youth sized)    Recommendations for Other Services OT consult     Precautions / Restrictions Precautions Precautions: Fall;Knee Precaution Booklet Issued: Yes (comment) Precaution Comments: wound vac Restrictions Weight Bearing Restrictions: Yes RLE Weight Bearing: Weight bearing as tolerated    Mobility  Bed Mobility Overal bed mobility: Needs Assistance Bed Mobility: Sit to Supine     Sit to supine: Min assist;HOB elevated   General bed mobility comments: assist for R LE  Transfers Overall transfer level: Needs assistance Equipment used: Rolling walker (2 wheeled) Transfers: Sit to/from Stand Sit to Stand: Min guard         General transfer comment: occasional vc's for technique; increased effort to stand on own  Ambulation/Gait Ambulation/Gait assistance: Min guard Gait Distance (Feet):  (12 feet x2) Assistive device: Rolling walker (2 wheeled) (youth sized)   Gait velocity: decreased   General Gait Details: antalgic; decreased stance  time R LE; vc's for gait technique   Stairs             Wheelchair Mobility    Modified Rankin (Stroke Patients Only)       Balance Overall balance assessment: Needs assistance Sitting-balance support: No upper extremity supported;Feet supported Sitting balance-Leahy Scale: Good Sitting balance - Comments: steady sitting reaching within BOS   Standing balance support: No upper extremity supported Standing balance-Leahy Scale: Fair Standing balance comment: steady static standing                            Cognition Arousal/Alertness: Awake/alert Behavior During Therapy: WFL for tasks assessed/performed Overall Cognitive Status: Within Functional Limits for tasks assessed                                 General Comments: Pt appearing tired      Exercises    General Comments General comments (skin integrity, edema, etc.): R LE dressings and wound vac in place.  Pt agreeable to PT session.      Pertinent Vitals/Pain Pain Assessment: 0-10 Pain Score: 8  Pain Location: R knee Pain Descriptors / Indicators: Aching;Sore;Tender;Operative site guarding Pain Intervention(s): Limited activity within patient's tolerance;Monitored during session;Premedicated before session;Repositioned;Other (comment) (polar care applied and activated)    Home Living                      Prior Function  PT Goals (current goals can now be found in the care plan section) Acute Rehab PT Goals Patient Stated Goal: to improve pain and mobility PT Goal Formulation: With patient Time For Goal Achievement: 01/15/20 Potential to Achieve Goals: Good Progress towards PT goals: Progressing toward goals    Frequency    BID      PT Plan Current plan remains appropriate    Co-evaluation              AM-PAC PT "6 Clicks" Mobility   Outcome Measure  Help needed turning from your back to your side while in a flat bed without using  bedrails?: A Little Help needed moving from lying on your back to sitting on the side of a flat bed without using bedrails?: A Little Help needed moving to and from a bed to a chair (including a wheelchair)?: A Little Help needed standing up from a chair using your arms (e.g., wheelchair or bedside chair)?: A Little Help needed to walk in hospital room?: A Little Help needed climbing 3-5 steps with a railing? : A Lot 6 Click Score: 17    End of Session Equipment Utilized During Treatment: Gait belt Activity Tolerance: Patient limited by pain Patient left: in bed;with call bell/phone within reach;with bed alarm set;with SCD's reapplied;Other (comment) (B heels floating via towel rolls; polar care in place and activated) Nurse Communication: Mobility status;Precautions;Weight bearing status;Other (comment);Patient requests pain meds (Nurse did not answer phone but NT reported she would let nurse know about pt's pain status) PT Visit Diagnosis: Other abnormalities of gait and mobility (R26.89);Muscle weakness (generalized) (M62.81);History of falling (Z91.81);Difficulty in walking, not elsewhere classified (R26.2);Pain Pain - Right/Left: Right Pain - part of body: Knee     Time: 8295-6213 PT Time Calculation (min) (ACUTE ONLY): 36 min  Charges:  $Gait Training: 8-22 mins $Therapeutic Activity: 8-22 mins                    Leitha Bleak, PT 01/02/20, 4:51 PM

## 2020-01-02 NOTE — Progress Notes (Signed)
Occupational Therapy Treatment Patient Details Name: Erin Petersen MRN: [id number] DOB: September 21, 1940 Today's Date: 01/02/2020    History of present illness Pt is a 79 y.o. female s/p R TKA 11/4 secondary primary OA.  PMH includes asthma, COPD, htn, sleep apnea, neuro-degenerative disorders, spine fusion 2002, neuropathy R leg and foot, RLS.   OT comments  Pt seen for OT tx this afternoon. Pt in bed, fatigued, suspect due to recent pain medication taken. Pt instructed in compression stocking mgt, polar care mgt, AE/DME for ADL tasks, and falls prevention strategies. Pt verbalized understanding. Too lethargic to trial learned strategies this session. Will continue to instruct next session.    Follow Up Recommendations  SNF    Equipment Recommendations  3 in 1 bedside commode    Recommendations for Other Services      Precautions / Restrictions Precautions Precautions: Fall;Knee Precaution Booklet Issued: No Precaution Comments: wound vac Restrictions Weight Bearing Restrictions: Yes RLE Weight Bearing: Weight bearing as tolerated       Mobility Bed Mobility Overal bed mobility: Needs Assistance Bed Mobility: Supine to Sit     Supine to sit: Min assist;HOB elevated     General bed mobility comments: deferred 2/2 lethargy  Transfers Overall transfer level: Needs assistance Equipment used: Rolling walker (2 wheeled) Transfers: Sit to/from Stand Sit to Stand: Min assist         General transfer comment: deferred 2/2 lethargy    Balance Overall balance assessment: Needs assistance Sitting-balance support: No upper extremity supported;Feet supported Sitting balance-Leahy Scale: Good Sitting balance - Comments: steady sitting reaching within BOS   Standing balance support: Single extremity supported Standing balance-Leahy Scale: Poor Standing balance comment: pt requiring at least single UE support for static standing balance                            ADL either performed or assessed with clinical judgement   ADL Overall ADL's : Needs assistance/impaired                                       General ADL Comments: Mod A for LB ADL tasks, set up and supervision for seated UB ADL tasks, Min A for ADL transfers, set up and SBA for pericare with lateral lean from [place] Patient Visual Report: No change from baseline     Perception     Praxis      Cognition Arousal/Alertness: Suspect due to medications;Lethargic Behavior During Therapy: WFL for tasks assessed/performed Overall Cognitive Status: Within Functional Limits for tasks assessed                                          Exercises Total Joint Exercises Ankle Circles/Pumps: AROM;Strengthening;Both;10 reps;Supine Quad Sets: AROM;Strengthening;Both;10 reps;Supine Heel Slides: AAROM;Strengthening;Right;10 reps;Supine Hip ABduction/ADduction: AAROM;Strengthening;Right;10 reps;Supine Long Arc Quad: AAROM;Strengthening;Right;10 reps;Seated Knee Flexion: AROM;Strengthening;Right;10 reps;Seated (limited flexion ROM noted) Goniometric ROM: R knee extension 5 degrees short of neutral; R knee flexion at least to 65 degrees (limited d/t R knee pain) General Exercises - Lower Extremity Hip Flexion/Marching: AAROM;Strengthening;Right;10 reps;Seated Other Exercises Other Exercises: Pt/dtr instructed in home/routines modifications, falls prevention, RW mgt for ADL transfers, AE for LB ADL; would benefit from further instruction and teach back Other Exercises:  Pt instructed in compression stocking mgt, polar care mgt, AE/DME for ADL tasks, and falls prevention strategies   Shoulder Instructions       General Comments R LE dressings and wound vac in place    Pertinent Vitals/ Pain       Pain Assessment: 0-10 Pain Score: 8  Pain Location: R knee Pain Descriptors / Indicators: Aching;Sore;Tender;Operative site guarding Pain Intervention(s):  Limited activity within patient's tolerance;Monitored during session;Premedicated before session;Ice applied  Home Living Family/patient expects to be discharged to:: Private residence Living Arrangements: Alone   Type of Home: House Home Access: Stairs to enter Technical brewer of Steps: 1 Entrance Stairs-Rails: None Home Layout: One level     Bathroom Shower/Tub: Occupational psychologist: Handicapped height     Home Equipment: Grab bars - tub/shower;Shower seat - built in;Walker - 2 wheels;Cane - single point;Shower seat   Additional Comments: has BSC frame but no bucket      Prior Functioning/Environment Level of Independence: Independent        Comments: 3 falls in past 6 months   Frequency  Min 2X/week        Progress Toward Goals  OT Goals(current goals can now be found in the care plan section)  Progress towards OT goals: Progressing toward goals;OT to reassess next treatment  Acute Rehab OT Goals Patient Stated Goal: to improve pain and mobility OT Goal Formulation: With patient/family Time For Goal Achievement: 01/16/20 Potential to Achieve Goals: Good ADL Goals Pt Will Perform Lower Body Dressing: sit to/from stand;with min guard assist Pt Will Transfer to Toilet: with min guard assist;ambulating;bedside commode (LRAD for amb) Additional ADL Goal #1: Pt will independently instruct family/caregiver in compression stocking and polar care mgt Additional ADL Goal #2: Pt will verbalize plan to implement at least 1 learned cognitive behavioral pain coping strategy to maximize pain mgt during recovery.  Plan Discharge plan remains appropriate;Frequency remains appropriate    Co-evaluation                 AM-PAC OT "6 Clicks" Daily Activity     Outcome Measure   Help from another person eating meals?: None Help from another person taking care of personal grooming?: None Help from another person toileting, which includes using toliet,  bedpan, or urinal?: A Little Help from another person bathing (including washing, rinsing, drying)?: A Lot Help from another person to put on and taking off regular upper body clothing?: None Help from another person to put on and taking off regular lower body clothing?: A Lot 6 Click Score: 19    End of Session Equipment Utilized During Treatment: Gait belt;Rolling walker  OT Visit Diagnosis: Other abnormalities of gait and mobility (R26.89);Pain Pain - Right/Left: Right Pain - part of body: Knee   Activity Tolerance Patient limited by lethargy;Patient limited by fatigue   Patient Left in bed;with call bell/phone within reach;with bed alarm set;with SCD's reapplied;Other (comment) (polar care in place, rolled towel under R ankle)   Nurse Communication Patient requests pain meds;Mobility status;Other (comment) (nasal cannula removed, room air, SpO2 >96%)        Time: 1315-1340 OT Time Calculation (min): 25 min  Charges: OT General Charges $OT Visit: 1 Visit OT Evaluation $OT Eval Moderate Complexity: 1 Mod OT Treatments $Self Care/Home Management : 23-37 mins $Therapeutic Activity: 8-22 mins  Jeni Salles, MPH, MS, OTR/L ascom [phone] 01/02/20, 1:52 PM

## 2020-01-02 NOTE — TOC Initial Note (Signed)
Transition of Care [facility]) - Initial/Assessment Note    Patient Details  Name: Erin Petersen MRN: [id number] Date of Birth: Sep 29, 1940  Transition of Care [facility]) CM/SW Contact:    Shelbie Ammons, RN Phone Number: 01/02/2020, 2:16 PM  Clinical Narrative:     RNCM met with patient at bedside, patient is sleepy but agreeable to speak with this CM. Patient reports she had a very bad night with pain and has not done as well with this knee replacement as she expected. Patient reports that she lives alone and at this time is unsure if she will be able to go home or if SNF may be a better option. Patient ultimately agreeable to bed search but reports she is still very hopeful to go home.   RNCM will complete PASSR, FL-2 and start bed search.   Patient is already set up with [facility] if she does decide to go home, if that is the case will need to order rolling walker and 3N1.               Expected Discharge Plan: Skilled Nursing Facility Barriers to Discharge: No Barriers Identified   Patient Goals and CMS Choice     Choice offered to / list presented to : Patient  Expected Discharge Plan and Services Expected Discharge Plan: [place]       Living arrangements for the past 2 months: Single Family Home                                      Prior Living Arrangements/Services Living arrangements for the past 2 months: Single Family Home Lives with:: Self Patient language and need for interpreter reviewed:: Yes Do you feel safe going back to the place where you live?: Yes      Need for Family Participation in Patient Care: Yes (Comment) Care giver support system in place?: Yes (comment)   Criminal Activity/Legal Involvement Pertinent to Current Situation/Hospitalization: No - Comment as needed  Activities of Daily Living Home Assistive Devices/Equipment: CPAP, Walker (specify type) ADL Screening (condition at time of admission) Patient's cognitive ability  adequate to safely complete daily activities?: No Is the patient deaf or have difficulty hearing?: No Does the patient have difficulty seeing, even when wearing glasses/contacts?: No Does the patient have difficulty concentrating, remembering, or making decisions?: No Patient able to express need for assistance with ADLs?: Yes Does the patient have difficulty dressing or bathing?: No Independently performs ADLs?: No Does the patient have difficulty walking or climbing stairs?: Yes Weakness of Legs: Right Weakness of Arms/Hands: None  Permission Sought/Granted                  Emotional Assessment Appearance:: Appears stated age   Affect (typically observed): Appropriate Orientation: : Oriented to Self, Oriented to Place, Oriented to  Time, Oriented to Situation Alcohol / Substance Use: Not Applicable Psych Involvement: No (comment)  Admission diagnosis:  S/P TKR (total knee replacement) using cement, right [Z96.651] Patient Active Problem List   Diagnosis Date Noted  . S/P TKR (total knee replacement) using cement, right 01/01/2020  . History of vaginal hysterectomy 06/11/2019  . Pyelonephritis of right kidney 06/03/2019  . Prediabetes 04/22/2019  . Estrogen deficiency 04/22/2019  . Urinary frequency 04/22/2019  . History of skin cancer 04/22/2019  . High-tone pelvic floor dysfunction 04/03/2019  . IC (interstitial cystitis) 04/03/2019  . Neuralgia of  both pudendal nerves 04/03/2019  . Pelvic pain in female 04/03/2019  . Osteoporosis 02/12/2019  . Asthma 01/31/2019  . Adverse reaction to influenza vaccine, initial encounter 12/03/2018  . Vitamin D deficiency 12/03/2018  . Restless legs syndrome 11/28/2018  . Small fiber neuropathy 11/28/2018  . Allergic rhinitis 10/08/2018  . Benign essential hypertension 10/08/2018  . Chronic bladder pain 10/08/2018  . Neuropathy 10/08/2018  . Osteoarthritis 10/08/2018  . S/P BSO (bilateral salpingo-oophorectomy) 01/07/2018  .  OSA on CPAP 12/29/2016  . Shortness of breath 06/24/2012  . Other and unspecified disc disorder of lumbar region 10/25/2000  . High cholesterol 09/24/1998  . Tinnitus of both ears 09/24/1983   PCP:  Elby Beck, FNP Pharmacy:   [facility], [address], [address] [place], [place] [address] Phone: [phone] Fax: [phone]  [id number] [address]Phone: [phone]: [phone]    Social Determinants of Health (SDOH) Interventions    Readmission Risk Interventions No flowsheet data found.

## 2020-01-02 NOTE — Plan of Care (Signed)

## 2020-01-02 NOTE — Anesthesia Postprocedure Evaluation (Signed)
Anesthesia Post Note  Patient: TRINITI GRUETZMACHER  Procedure(s) Performed: Right total knee arthroplasty - [name] to Assist (Right Knee)  Patient location during evaluation: Nursing Unit Anesthesia Type: Spinal Level of consciousness: oriented and awake and alert Pain management: pain level not controlled Vital Signs Assessment: post-procedure vital signs reviewed and stable Respiratory status: spontaneous breathing and respiratory function stable Cardiovascular status: blood pressure returned to baseline and stable Postop Assessment: no headache, no backache, no apparent nausea or vomiting and patient able to bend at knees Anesthetic complications: no Comments: Patient in significant pain.  RN and Surgeon notified. Patient reports no anesthetic questions/concerns.    No complications documented.   Last Vitals:  Vitals:   01/02/20 0451 01/02/20 0828  BP: (!) 147/72 (!) 146/65  Pulse: 78 78  Resp: 17 17  Temp: 37.1 C 36.9 C  SpO2: 99% 99%    Last Pain:  Vitals:   01/02/20 0828  TempSrc: Oral  PainSc:                  Jerrye Noble

## 2020-01-02 NOTE — Progress Notes (Signed)
   Subjective: 1 Day Post-Op Procedure(s) (LRB): Right total knee arthroplasty - [name] to Assist (Right) Patient reports pain as severe.   Patient is well, and has had no acute complaints or problems Denies any CP, SOB, ABD pain. We will continue therapy today.   Objective: Vital signs in last 24 hours: Temp:  [98 F (36.7 C)-98.9 F (37.2 C)] 98.8 F (37.1 C) (11/05 0451) Pulse Rate:  [67-96] 78 (11/05 0451) Resp:  [10-21] 17 (11/05 0451) BP: (110-166)/(54-86) 147/72 (11/05 0451) SpO2:  [94 %-100 %] 99 % (11/05 0451)  Intake/Output from previous day: 11/04 0701 - 11/05 0700 In: 2832.1 [P.O.:200; I.V.:2482.1; IV Piggyback:150] Out: 1800 [Urine:1775; Blood:25] Intake/Output this shift: No intake/output data recorded.  Recent Labs    01/01/20 1444 01/02/20 0357  HGB 11.9* 10.6*   Recent Labs    01/01/20 1444 01/02/20 0357  WBC 15.7* 9.5  RBC 4.73 4.23  HCT 37.4 33.4*  PLT 391 328   Recent Labs    01/01/20 1444 01/02/20 0357  NA  --  131*  K  --  3.9  CL  --  96*  CO2  --  28  BUN  --  8  CREATININE 0.57 0.71  GLUCOSE  --  152*  CALCIUM  --  8.4*   No results for input(s): LABPT, INR in the last 72 hours.  EXAM General - Patient is Alert, Appropriate and Oriented Extremity - Neurovascular intact Sensation intact distally Intact pulses distally Dorsiflexion/Plantar flexion intact No cellulitis present Compartment soft Dressing - dressing C/D/I and no drainage, Praveena intact without drainage Motor Function - intact, moving foot and toes well on exam.   Past Medical History:  Diagnosis Date  . Arthritis   . Asthma    exercise induced asthma  . Bacteremia due to Gram-negative bacteria 06/04/2019  . Colon polyps   . Duodenal ulcer   . Dyspnea   . GERD (gastroesophageal reflux disease)   . Hyperlipidemia   . Hypertension   . Neuropathy    right leg and foot  . Plantar fasciitis   . Pre-diabetes   . Prediabetes   . RLQ abdominal pain    . RLS (restless legs syndrome)   . Sleep apnea    CPAP  . SOB (shortness of breath)   . Urinary incontinence   . Urinary tract infection   . Wears hearing aid in right ear     Assessment/Plan:   1 Day Post-Op Procedure(s) (LRB): Right total knee arthroplasty - [name] to Assist (Right) Active Problems:   S/P TKR (total knee replacement) using cement, right  Estimated body mass index is 32.51 kg/m as calculated from the following:   Height as of this encounter: 4' 11"  (1.499 m).   Weight as of this encounter: 73 kg. Advance diet Up with therapy  Work on bowel movement Labs and vital signs are stable Severe pain-OxyContin 15 mg extended release added to pain regimen.  We will continue to monitor pain today. Care management to assist with discharge.  DVT Prophylaxis - Lovenox, TED hose and SCDs Weight-Bearing as tolerated to right leg   T. [name], PA-C [place] 01/02/2020, 8:19 AM

## 2020-01-03 ENCOUNTER — Inpatient Hospital Stay: Payer: Medicare Other

## 2020-01-03 NOTE — Progress Notes (Signed)
Physical Therapy Treatment Patient Details Name: Erin Petersen MRN: [id number] DOB: [birth year] Today's Date: 01/03/2020    History of Present Illness Pt is a 79 y.o. female s/p R TKA 11/4 secondary primary OA.  PMH includes asthma, COPD, htn, sleep apnea, neuro-degenerative disorders, spine fusion 2002, neuropathy R leg and foot, RLS.    PT Comments    Pt was long sitting in bed upon author returning for PM session. Pt has 2 L o2 reapplied due to sao2 85% on rm air. On 2 L throughout this session. Pt was able to exit bed with supervision + use of sheet to progress RLE. Stood with supervision and ambulated 80 ft. Continues to fatigue quickly but overall less fatigue noted than in earlier session. She returned to room and perform ascending/descending 1 step with RW. She was able to perform with CGA only. Pt has one step to enter home and will benefit from performing step training again in morning. AROM seated EOB 92 degree flexion. Is tolerating towel roll to promote ext in supine. [name] arrived at conclusion of session. Therapist discussed new recommendation for DC home with HHPT to follow if able to have someone home 24/7 at first. Daughter reports she will have needed assistance at home. DC recs updated to home with HHPT. Will perform step training in next session.     Follow Up Recommendations  Home health PT;Supervision/Assistance - 24 hour;Supervision for mobility/OOB;Follow surgeons recommendation for DC plan and follow-up therapies     Equipment Recommendations  Rolling walker with 5" wheels;3in1 (PT)    Recommendations for Other Services       Precautions / Restrictions Precautions Precautions: Fall;Knee Precaution Booklet Issued: Yes (comment) Precaution Comments: wound vac Restrictions Weight Bearing Restrictions: Yes RLE Weight Bearing: Weight bearing as tolerated    Mobility  Bed Mobility Overal bed mobility: Needs Assistance Bed Mobility: Supine to Sit;Sit  to Supine     Supine to sit: Supervision;HOB elevated Sit to supine: HOB elevated;Min assist   General bed mobility comments: supervision with use of sheet to assist leg OOB. required min assist after OOB activity to return to supine   Transfers Overall transfer level: Needs assistance Equipment used: Rolling walker (2 wheeled) Transfers: Sit to/from Stand Sit to Stand: Supervision         General transfer comment: pt was able to STS from EOB and from [facility] with supervision only  Ambulation/Gait Ambulation/Gait assistance: Supervision Gait Distance (Feet): 80 Feet Assistive device: Rolling walker (2 wheeled) Gait Pattern/deviations: Step-through pattern;Trunk flexed Gait velocity: decreased   General Gait Details: pt was able to ambulate further distances this afternoon but does continue to be limited by fatigue.   Stairs Stairs: Yes Stairs assistance: Min guard Stair Management: No rails;Forwards;With walker;Step to pattern Number of Stairs: 1 General stair comments: pt reports having one step to enter home. was able to perform with CGA ionly after author demonstrated correct sequencing       Balance Overall balance assessment: Needs assistance Sitting-balance support: No upper extremity supported;Feet supported Sitting balance-Leahy Scale: Good Sitting balance - Comments: steady sitting reaching within BOS   Standing balance support: Bilateral upper extremity supported;During functional activity Standing balance-Leahy Scale: Fair Standing balance comment: reliant on RW for balance. let go of RW several times but has some unsteadiness        Cognition Arousal/Alertness: Lethargic;Suspect due to medications Behavior During Therapy: [facility] for tasks assessed/performed Overall Cognitive Status: Within Functional Limits for tasks assessed  General Comments: Pt was more alert but xcontinues to struggle keeping eyes open. question if due to medication.       Exercises Total Joint Exercises Goniometric ROM: 92 degrees flexion        Pertinent Vitals/Pain Pain Assessment: 0-10 Pain Score: 4  Pain Location: R knee Pain Descriptors / Indicators: Aching;Sore;Tender;Operative site guarding Pain Intervention(s): Limited activity within patient's tolerance;Premedicated before session;Monitored during session;Repositioned;Ice applied           PT Goals (current goals can now be found in the care plan section) Acute Rehab PT Goals Patient Stated Goal: to go home with 24 hour supervision from sister Progress towards PT goals: Progressing toward goals    Frequency    BID      PT Plan Discharge plan needs to be updated       AM-PAC PT "6 Clicks" Mobility   Outcome Measure  Help needed turning from your back to your side while in a flat bed without using bedrails?: A Little Help needed moving from lying on your back to sitting on the side of a flat bed without using bedrails?: A Little Help needed moving to and from a bed to a chair (including a wheelchair)?: A Little Help needed standing up from a chair using your arms (e.g., wheelchair or bedside chair)?: A Little Help needed to walk in hospital room?: A Little Help needed climbing 3-5 steps with a railing? : A Little 6 Click Score: 18    End of Session Equipment Utilized During Treatment: Gait belt Activity Tolerance: Patient limited by pain;Patient limited by fatigue Patient left: in bed;with call bell/phone within reach;with bed alarm set;with family/visitor present;with SCD's reapplied (discussed recommendation with pt's daughter) Nurse Communication: Mobility status;Precautions;Weight bearing status;Other (comment);Patient requests pain meds PT Visit Diagnosis: Other abnormalities of gait and mobility (R26.89);Muscle weakness (generalized) (M62.81);History of falling (Z91.81);Difficulty in walking, not elsewhere classified (R26.2);Pain Pain - Right/Left: Right Pain - part of  body: Knee     Time: 1700-1749 PT Time Calculation (min) (ACUTE ONLY): 30 min  Charges:  $Gait Training: 8-22 mins $Therapeutic Exercise: 8-22 mins $Therapeutic Activity: 23-37 mins                     Julaine Fusi PTA 01/03/20, 1:22 PM

## 2020-01-03 NOTE — Progress Notes (Signed)
   Subjective: 2 Days Post-Op Procedure(s) (LRB): Right total knee arthroplasty - Erin Petersen to Assist (Right) Patient reports pain as moderate.  Improved from yesterday. Patient is well, and has had no acute complaints or problems Denies any CP, SOB, ABD pain.  Does report increase in wheeze this AM. We will continue therapy today.   Objective: Vital signs in last 24 hours: Temp:  [98.1 F (36.7 C)-99.9 F (37.7 C)] 99 F (37.2 C) (11/06 0812) Pulse Rate:  [76-94] 94 (11/06 0812) Resp:  [16-18] 16 (11/06 0812) BP: (132-164)/(62-70) 163/70 (11/06 0812) SpO2:  [85 %-100 %] 93 % (11/06 0812)  Intake/Output from previous day: 11/05 0701 - 11/06 0700 In: 120 [P.O.:120] Out: 0  Intake/Output this shift: No intake/output data recorded.  Recent Labs    01/01/20 1444 01/02/20 0357  HGB 11.9* 10.6*   Recent Labs    01/01/20 1444 01/02/20 0357  WBC 15.7* 9.5  RBC 4.73 4.23  HCT 37.4 33.4*  PLT 391 328   Recent Labs    01/01/20 1444 01/02/20 0357  NA  --  131*  K  --  3.9  CL  --  96*  CO2  --  28  BUN  --  8  CREATININE 0.57 0.71  GLUCOSE  --  152*  CALCIUM  --  8.4*   No results for input(s): LABPT, INR in the last 72 hours.  EXAM Lungs: Moderate wheeze on auscultation General - Patient is Alert, Appropriate and Oriented Extremity - Neurovascular intact Sensation intact distally Intact pulses distally Dorsiflexion/Plantar flexion intact No cellulitis present Compartment soft Dressing - dressing C/D/I and no drainage, Praveena intact without drainage Motor Function - intact, moving foot and toes well on exam.   Past Medical History:  Diagnosis Date  . Arthritis   . Asthma    exercise induced asthma  . Bacteremia due to Gram-negative bacteria 06/04/2019  . Colon polyps   . Duodenal ulcer   . Dyspnea   . GERD (gastroesophageal reflux disease)   . Hyperlipidemia   . Hypertension   . Neuropathy    right leg and foot  . Plantar fasciitis   .  Pre-diabetes   . Prediabetes   . RLQ abdominal pain   . RLS (restless legs syndrome)   . Sleep apnea    CPAP  . SOB (shortness of breath)   . Urinary incontinence   . Urinary tract infection   . Wears hearing aid in right ear     Assessment/Plan:   2 Days Post-Op Procedure(s) (LRB): Right total knee arthroplasty - Erin Petersen to Assist (Right) Active Problems:   S/P TKR (total knee replacement) using cement, right  Estimated body mass index is 32.51 kg/m as calculated from the following:   Height as of this encounter: 4' 11"  (1.499 m).   Weight as of this encounter: 73 kg. Advance diet Up with therapy   Work on bowel movement Vital signs stable.  No dizziness or sedation. Continue with Oxycontin, Norco and Tramadol as needed for pain. PT currently recommending SNF, patient would like to go home.  Continue to monitor. Possible d/c home tomorrow.  DVT Prophylaxis - Lovenox, TED hose and SCDs Weight-Bearing as tolerated to right leg  J. Cameron Proud, PA-C [place] 01/03/2020, 9:06 AM

## 2020-01-03 NOTE — Progress Notes (Signed)
Pt states she does not want new IV placed due to MD stating she will be discharged tomorrow. Pt aware that IV should be placed in case of emergency tx, but politely prefers to refuse at this time.

## 2020-01-03 NOTE — Plan of Care (Signed)

## 2020-01-03 NOTE — Progress Notes (Signed)
Occupational Therapy Treatment Patient Details Name: Erin Petersen MRN: [id number] DOB: 05/19/1940 Today's Date: 01/03/2020    History of present illness Pt is a 79 y.o. female s/p R TKA 11/4 secondary primary OA.  PMH includes asthma, COPD, htn, sleep apnea, neuro-degenerative disorders, spine fusion 2002, neuropathy R leg and foot, RLS.   OT comments  Erin Petersen was seen for OT treatment on this date. Upon arrival to room pt reclined in bed agreeable to tx session. Pt reports improved pain and alertness this date - no lethargy noted t/o session. Pt requires SBA + RW for toilet t/f and perihygiene (BSC over toilet). SBA + RW for standing hand washing. MOD A don R sock, SBA don L sock in long sitting. Pt asked appropriate questions and return verbalized instruction in polar care/compression stocking mgmt. Pt making good progress toward goals. Pt continues to benefit from skilled OT services to maximize return to PLOF and minimize risk of future falls, injury, caregiver burden, and readmission. Will continue to follow POC. Discharge updated to [place] c 24/7 supervision to reflect pt's progress.   Follow Up Recommendations  Home health OT;Supervision/Assistance - 24 hour    Equipment Recommendations  3 in 1 bedside commode    Recommendations for Other Services      Precautions / Restrictions Precautions Precautions: Fall;Knee Precaution Booklet Issued: Yes (comment) Precaution Comments: wound vac Restrictions Weight Bearing Restrictions: Yes RLE Weight Bearing: Weight bearing as tolerated       Mobility Bed Mobility Overal bed mobility: Needs Assistance Bed Mobility: Supine to Sit     Supine to sit: Supervision;HOB elevated  Transfers Overall transfer level: Needs assistance Equipment used: Rolling walker (2 wheeled) Transfers: Sit to/from Stand Sit to Stand: Supervision         Balance Overall balance assessment: Needs assistance Sitting-balance support: No upper  extremity supported;Feet supported Sitting balance-Leahy Scale: Good Standing balance support: During functional activity;No upper extremity supported Standing balance-Leahy Scale: Fair          ADL either performed or assessed with clinical judgement   ADL Overall ADL's : Needs assistance/impaired     General ADL Comments: SBA + RW for toilet t/f and perihygiene (BSC over toilet). SBA + RW for standing hand washing. MOD A don R sock, SBA don L sock in long sitting               Cognition Arousal/Alertness: Awake/alert Behavior During Therapy: WFL for tasks assessed/performed Overall Cognitive Status: Within Functional Limits for tasks assessed           General Comments: Pt was alert and attentive to conversation, asked appropriate questions         Exercises Exercises: Other exercises Other Exercises Other Exercises: Pt educated re: polar care mgmt, compression stocking mgmt, safe RW t/fs, falls prevention, home/routines modifications Other Exercises: LBD, toileting, hand washing, sup>sit, sit<>stand, sitting/standing balance/tolerance           Pertinent Vitals/ Pain       Pain Assessment: 0-10 Pain Score: 3  Pain Location: R knee Pain Descriptors / Indicators: Aching;Sore;Tender;Operative site guarding Pain Intervention(s): Limited activity within patient's tolerance;Repositioned         Frequency  Min 2X/week        Progress Toward Goals  OT Goals(current goals can now be found in the care plan section)  Progress towards OT goals: Progressing toward goals  Acute Rehab OT Goals Patient Stated Goal: to go home with 24 hour supervision from sister  OT Goal Formulation: With patient/family Time For Goal Achievement: 01/16/20 Potential to Achieve Goals: Good ADL Goals Pt Will Perform Lower Body Dressing: sit to/from stand;with min guard assist Pt Will Transfer to Toilet: with min guard assist;ambulating;bedside commode (LRAD for amb) Additional ADL  Goal #1: Pt will independently instruct family/caregiver in compression stocking and polar care mgt Additional ADL Goal #2: Pt will verbalize plan to implement at least 1 learned cognitive behavioral pain coping strategy to maximize pain mgt during recovery.  Plan Frequency remains appropriate;Discharge plan needs to be updated       AM-PAC OT "6 Clicks" Daily Activity     Outcome Measure   Help from another person eating meals?: None Help from another person taking care of personal grooming?: None Help from another person toileting, which includes using toliet, bedpan, or urinal?: A Little Help from another person bathing (including washing, rinsing, drying)?: A Lot Help from another person to put on and taking off regular upper body clothing?: None Help from another person to put on and taking off regular lower body clothing?: A Lot 6 Click Score: 19    End of Session Equipment Utilized During Treatment: Rolling walker  OT Visit Diagnosis: Other abnormalities of gait and mobility (R26.89);Pain Pain - Right/Left: Right Pain - part of body: Knee   Activity Tolerance Patient tolerated treatment well   Patient Left in chair;with call bell/phone within reach;with chair alarm set   Nurse Communication          Time: 0998-3382 OT Time Calculation (min): 26 min  Charges: OT General Charges $OT Visit: 1 Visit OT Treatments $Self Care/Home Management : 23-37 mins  Dessie Coma, M.S. OTR/L  01/03/20, 4:16 PM  ascom [phone]

## 2020-01-03 NOTE — Progress Notes (Signed)
Pt has call bell on at this time, this nurse in to assess pt needs, pt states, "I don't know what happened but my IV must have come out and my gown and bed is wet." Pt assessed, no bleeding noted, oncoming nurse updated.

## 2020-01-03 NOTE — Progress Notes (Signed)
Physical Therapy Treatment Patient Details Name: Erin Petersen MRN: [id number] DOB: Mar 24, 1940 Today's Date: 01/03/2020    History of Present Illness Pt is a 79 y.o. female s/p R TKA 11/4 secondary primary OA.  PMH includes asthma, COPD, htn, sleep apnea, neuro-degenerative disorders, spine fusion 2002, neuropathy R leg and foot, RLS.    PT Comments    Pt was long sitting in bed upon arriving. Issued pain meds about 1 hour prior to session. Pt struggles staying awake throughout session. She has O2 laying in her bed bedside her upon arriving. sao2 86%. 2 L o2 applied and quickly recovers to mid 90s. Was able to wean from O2 throughout session and maintained > 90 % on rm air. Recommend continued monitoring throughout the day. She required min assistance to achieve EOB short sit however supervision only to stand to RW. CGA for safety during gait training. She was only able to ambulate to doorway of room prior to endorsing severe fatigue and requesting to return to bed/chair. Pt very limited by fatigue and pain. Will return later this date to continue to progress pt. She does not want to go to rehab at DC however due to fatigue and assistance, acute PT continues to recommend at this time. Will return this afternoon to continue to progress as per POC.  Pt does state she is trying to get her sister from [place] to come stay with her for a week. Will see if pt is able to perform better in PM session to improve safety for DC to home. At conclusion of session, pt was in recliner with call bell in reach and chair alarm in place.     Follow Up Recommendations  SNF     Equipment Recommendations  Rolling walker with 5" wheels;3in1 (PT) (youth RW)    Recommendations for Other Services       Precautions / Restrictions Precautions Precautions: Fall;Knee Precaution Booklet Issued: Yes (comment) Restrictions Weight Bearing Restrictions: Yes RLE Weight Bearing: Weight bearing as tolerated    Mobility  Bed  Mobility Overal bed mobility: Needs Assistance Bed Mobility: Supine to Sit     Supine to sit: HOB elevated;Min assist     General bed mobility comments: Increased time due to pain. pt unwilling to attempt without therapist supporting RLE throughout.  Transfers Overall transfer level: Needs assistance Equipment used: Rolling walker (2 wheeled) Transfers: Sit to/from Stand Sit to Stand: Supervision         General transfer comment: pt was able to STS from EOB and from [facility] with supervision only  Ambulation/Gait Ambulation/Gait assistance: Min guard Gait Distance (Feet): 25 Feet Assistive device: Rolling walker (2 wheeled) Gait Pattern/deviations: Antalgic;Step-to pattern;Trunk flexed Gait velocity: decreased   General Gait Details: Pt very fatigued with minimal gait distances. unable to ambulate further than to doorway and back to recliner      Balance Overall balance assessment: Needs assistance Sitting-balance support: No upper extremity supported;Feet supported Sitting balance-Leahy Scale: Good Sitting balance - Comments: steady sitting reaching within BOS   Standing balance support: Bilateral upper extremity supported;During functional activity Standing balance-Leahy Scale: Fair Standing balance comment: reliant on RW for support         Cognition Arousal/Alertness: Lethargic;Suspect due to medications Behavior During Therapy: [facility] for tasks assessed/performed Overall Cognitive Status: Within Functional Limits for tasks assessed          General Comments: pt has difficulty time keeping eyes open during session. she reports its from pain medication given about hour  prior       Pertinent Vitals/Pain Pain Assessment: 0-10 Pain Score: 8  Pain Location: R knee Pain Descriptors / Indicators: Aching;Sore;Tender;Operative site guarding Pain Intervention(s): Limited activity within patient's tolerance;Monitored during session;Premedicated before  session;Repositioned;Ice applied    \       PT Goals (current goals can now be found in the care plan section) Acute Rehab PT Goals Patient Stated Goal: to improve pain and mobility Progress towards PT goals: Progressing toward goals    Frequency    BID      PT Plan Current plan remains appropriate       AM-PAC PT "6 Clicks" Mobility   Outcome Measure  Help needed turning from your back to your side while in a flat bed without using bedrails?: A Little Help needed moving from lying on your back to sitting on the side of a flat bed without using bedrails?: A Little Help needed moving to and from a bed to a chair (including a wheelchair)?: A Little Help needed standing up from a chair using your arms (e.g., wheelchair or bedside chair)?: A Little Help needed to walk in hospital room?: A Little Help needed climbing 3-5 steps with a railing? : A Little 6 Click Score: 18    End of Session Equipment Utilized During Treatment: Gait belt Activity Tolerance: Patient limited by pain;Patient limited by fatigue Patient left: in chair;with call bell/phone within reach;with chair alarm set;with SCD's reapplied Nurse Communication: Mobility status;Precautions;Weight bearing status;Other (comment);Patient requests pain meds PT Visit Diagnosis: Other abnormalities of gait and mobility (R26.89);Muscle weakness (generalized) (M62.81);History of falling (Z91.81);Difficulty in walking, not elsewhere classified (R26.2);Pain Pain - Right/Left: Right Pain - part of body: Knee     Time: 0930-1008 PT Time Calculation (min) (ACUTE ONLY): 38 min  Charges:  $Gait Training: 8-22 mins $Therapeutic Activity: 23-37 mins                     Julaine Fusi PTA 01/03/20, 11:57 AM

## 2020-01-04 NOTE — Progress Notes (Signed)
Physical Therapy Treatment Patient Details Name: Erin Petersen MRN: [id number] DOB: 11/20/1940 Today's Date: 01/04/2020    History of Present Illness Pt is a 79 y.o. female s/p R TKA 11/4 secondary primary OA.  PMH includes asthma, COPD, htn, sleep apnea, neuro-degenerative disorders, spine fusion 2002, neuropathy R leg and foot, RLS.    PT Comments    Pt able to compete lap around nursing unit and stair training with walker and min guard.  Good understanding of mobility, ex and safety concerns.  Sister arrived to assist upon discharge.  No further questions or concerns regarding mobility skills.   Follow Up Recommendations  Home health PT;Supervision/Assistance - 24 hour;Supervision for mobility/OOB;Follow surgeon's recommendation for DC plan and follow-up therapies     Equipment Recommendations  Rolling walker with 5" wheels;3in1 (PT)    Recommendations for Other Services       Precautions / Restrictions Precautions Precautions: Fall;Knee Precaution Booklet Issued: Yes (comment) Restrictions Weight Bearing Restrictions: Yes RLE Weight Bearing: Weight bearing as tolerated    Mobility  Bed Mobility Overal bed mobility: Needs Assistance Bed Mobility: Sit to Supine       Sit to supine: Min assist   General bed mobility comments: For LE management  Transfers Overall transfer level: Needs assistance Equipment used: Rolling walker (2 wheeled) Transfers: Sit to/from Stand Sit to Stand: Supervision            Ambulation/Gait Ambulation/Gait assistance: Supervision Gait Distance (Feet): 200 Feet Assistive device: Rolling walker (2 wheeled) Gait Pattern/deviations: Step-through pattern;Trunk flexed Gait velocity: decreased       Stairs Stairs: Yes Stairs assistance: Min guard Stair Management: No rails;Forwards;With walker;Step to pattern Number of Stairs: 1 General stair comments: pt reports having one step to enter home. was able to perform with CGA  ionly after author demonstrated correct sequencing   Wheelchair Mobility    Modified Rankin (Stroke Patients Only)       Balance Overall balance assessment: Needs assistance Sitting-balance support: No upper extremity supported;Feet supported Sitting balance-Leahy Scale: Good     Standing balance support: During functional activity;No upper extremity supported Standing balance-Leahy Scale: Fair Standing balance comment: reliant on RW for balance.                            Cognition Arousal/Alertness: Awake/alert Behavior During Therapy: WFL for tasks assessed/performed Overall Cognitive Status: Within Functional Limits for tasks assessed                                 General Comments: Pt was alert and attentive to conversation, asked appropriate questions       Exercises Total Joint Exercises Ankle Circles/Pumps: AROM;Strengthening;Both;10 reps;Supine Quad Sets: AROM;Strengthening;Both;10 reps;Supine Heel Slides: AAROM;Strengthening;Right;10 reps;Supine Hip ABduction/ADduction: AAROM;Strengthening;Right;10 reps;Supine Long Arc Quad: AAROM;Strengthening;Right;10 reps;Seated Knee Flexion: AROM;Strengthening;Right;10 reps;Seated (limited flexion ROM noted) Goniometric ROM: 94    General Comments        Pertinent Vitals/Pain Pain Assessment: Faces Faces Pain Scale: Hurts a little bit Pain Location: R knee Pain Descriptors / Indicators: Aching;Sore;Tender;Operative site guarding Pain Intervention(s): Limited activity within patient's tolerance;Monitored during session;Repositioned;Ice applied    Home Living                      Prior Function            PT Goals (current goals can now be found in  the care plan section) Progress towards PT goals: Progressing toward goals    Frequency    BID      PT Plan Current plan remains appropriate    Co-evaluation              AM-PAC PT "6 Clicks" Mobility   Outcome  Measure  Help needed turning from your back to your side while in a flat bed without using bedrails?: A Little Help needed moving from lying on your back to sitting on the side of a flat bed without using bedrails?: A Little Help needed moving to and from a bed to a chair (including a wheelchair)?: A Little Help needed standing up from a chair using your arms (e.g., wheelchair or bedside chair)?: A Little Help needed to walk in hospital room?: A Little Help needed climbing 3-5 steps with a railing? : A Little 6 Click Score: 18    End of Session Equipment Utilized During Treatment: Gait belt Activity Tolerance: Patient tolerated treatment well Patient left: in bed;with call bell/phone within reach;with bed alarm set;with family/visitor present Nurse Communication: Mobility status;Precautions;Weight bearing status;Other (comment);Patient requests pain meds Pain - Right/Left: Right Pain - part of body: Knee     Time: 1610-9604 PT Time Calculation (min) (ACUTE ONLY): 19 min  Charges:  $Gait Training: 8-22 mins                    Chesley Noon, PTA 01/04/20, 10:13 AM

## 2020-01-04 NOTE — TOC Transition Note (Signed)
Transition of Care [facility]) - CM/SW Discharge Note   Patient Details  Name: Erin Petersen MRN: [id number] Date of Birth: May 16, 1940  Transition of Care [facility]) CM/SW Contact:  Boris Sharper, LCSW Phone Number: 01/04/2020, 11:24 AM   Clinical Narrative:    CSW was made aware that pt does not have DME, pt needs RW and 3n1, CSW delivered youth size RW and 3n1 through [employer].     Final next level of care: Home w Home Health Services Barriers to Discharge: No Barriers Identified   Patient Goals and CMS Choice     Choice offered to / list presented to : Patient  Discharge Placement                Patient to be transferred to facility by: Daughter Name of family member notified: Lora Patient and family notified of of transfer: 01/04/20  Discharge Plan and Services                DME Arranged: 3-N-1, Walker rolling DME Agency: [employer] Date DME Agency Contacted: 01/04/20 Time DME Agency Contacted: [phone] Representative spoke with at DME Agency: [place]: PT [place]: [place] ([place]) Date [place]: 01/04/20 Time [place]: [phone] Representative spoke with at [place]: [place] ([place]) Interventions     Readmission Risk Interventions No flowsheet data found.

## 2020-01-04 NOTE — TOC Transition Note (Signed)
Transition of Care [facility]) - CM/SW Discharge Note   Patient Details  Name: Erin Petersen MRN: [id number] Date of Birth: 1940/06/08  Transition of Care [facility]) CM/SW Contact:  Boris Sharper, LCSW Phone Number: 01/04/2020, 10:43 AM   Clinical Narrative:    Pt is medically stable of discharge per MD. Pt will be transported home by her sister. CSW spoke with Vaughan Basta at [facility] and arranged HH PT. Pt notfied CSW that her sister is going to come and stay with her for a few weeks to help her out. Pt stated that she already has a RW.       Barriers to Discharge: No Barriers Identified   Patient Goals and CMS Choice     Choice offered to / list presented to : Patient  Discharge Placement                       Discharge Plan and Services                                     Social Determinants of Health (SDOH) Interventions     Readmission Risk Interventions No flowsheet data found.

## 2020-01-04 NOTE — Progress Notes (Signed)
Discharge note:  Pt discharged to home today. Discharge instructions provided to pt. Verbalized understanding. Pt provided with BSC and walker. Driven home by family member. Wheeled to car by staff.  Ronnette Hila, RN

## 2020-01-04 NOTE — Plan of Care (Signed)

## 2020-01-04 NOTE — Progress Notes (Signed)
   Subjective: 3 Days Post-Op Procedure(s) (LRB): Right total knee arthroplasty - [name] to Assist (Right) Patient reports pain as moderate.  Patient is well, and has had no acute complaints or problems Denies any CP, SOB, ABD pain.  No evidence for pneumonia on chest x-ray. We will continue therapy today.   Currently recommending HHPT.  Objective: Vital signs in last 24 hours: Temp:  [97.3 F (36.3 C)-99.4 F (37.4 C)] 98.5 F (36.9 C) (11/07 0808) Pulse Rate:  [79-90] 90 (11/07 0808) Resp:  [16-20] 20 (11/07 0808) BP: (125-146)/(59-66) 142/59 (11/07 0808) SpO2:  [90 %-94 %] 93 % (11/07 0808)  Intake/Output from previous day: 11/06 0701 - 11/07 0700 In: -  Out: 1 [Stool:1] Intake/Output this shift: No intake/output data recorded.  Recent Labs    01/01/20 1444 01/02/20 0357  HGB 11.9* 10.6*   Recent Labs    01/01/20 1444 01/02/20 0357  WBC 15.7* 9.5  RBC 4.73 4.23  HCT 37.4 33.4*  PLT 391 328   Recent Labs    01/01/20 1444 01/02/20 0357  NA  --  131*  K  --  3.9  CL  --  96*  CO2  --  28  BUN  --  8  CREATININE 0.57 0.71  GLUCOSE  --  152*  CALCIUM  --  8.4*   No results for input(s): LABPT, INR in the last 72 hours.  EXAM Lungs: Moderate wheeze on auscultation General - Patient is Alert, Appropriate and Oriented Extremity - Neurovascular intact Sensation intact distally Intact pulses distally Dorsiflexion/Plantar flexion intact No cellulitis present Compartment soft Dressing - dressing C/D/I and no drainage, Praveena intact without drainage Motor Function - intact, moving foot and toes well on exam.   Past Medical History:  Diagnosis Date  . Arthritis   . Asthma    exercise induced asthma  . Bacteremia due to Gram-negative bacteria 06/04/2019  . Colon polyps   . Duodenal ulcer   . Dyspnea   . GERD (gastroesophageal reflux disease)   . Hyperlipidemia   . Hypertension   . Neuropathy    right leg and foot  . Plantar fasciitis   .  Pre-diabetes   . Prediabetes   . RLQ abdominal pain   . RLS (restless legs syndrome)   . Sleep apnea    CPAP  . SOB (shortness of breath)   . Urinary incontinence   . Urinary tract infection   . Wears hearing aid in right ear     Assessment/Plan:   3 Days Post-Op Procedure(s) (LRB): Right total knee arthroplasty - [name] to Assist (Right) Active Problems:   S/P TKR (total knee replacement) using cement, right  Estimated body mass index is 32.51 kg/m as calculated from the following:   Height as of this encounter: 4' 11"  (1.499 m).   Weight as of this encounter: 73 kg. Advance diet Up with therapy   Work on bowel movement, suppository this AM. Vital signs stable.  No dizziness or sedation with pain meds. Continue with Oxycontin, Norco and Tramadol as needed for pain. PT currently recommending HHPT, plan will be for discharge today pending a BM. No signs of pneumonia on x-ray, no chest pain, tachycardia.  Encouraged incentive spirometer.  DVT Prophylaxis - Lovenox, TED hose and SCDs Weight-Bearing as tolerated to right leg  J. Cameron Proud, PA-C [place] 01/04/2020, 9:04 AM

## 2020-01-05 DIAGNOSIS — K219 Gastro-esophageal reflux disease without esophagitis: Secondary | ICD-10-CM | POA: Diagnosis not present

## 2020-01-05 DIAGNOSIS — Z96651 Presence of right artificial knee joint: Secondary | ICD-10-CM | POA: Diagnosis not present

## 2020-01-05 DIAGNOSIS — G4730 Sleep apnea, unspecified: Secondary | ICD-10-CM | POA: Diagnosis not present

## 2020-01-05 DIAGNOSIS — T7840XD Allergy, unspecified, subsequent encounter: Secondary | ICD-10-CM | POA: Diagnosis not present

## 2020-01-05 DIAGNOSIS — Z7901 Long term (current) use of anticoagulants: Secondary | ICD-10-CM | POA: Diagnosis not present

## 2020-01-05 DIAGNOSIS — Z471 Aftercare following joint replacement surgery: Secondary | ICD-10-CM | POA: Diagnosis not present

## 2020-01-05 DIAGNOSIS — I10 Essential (primary) hypertension: Secondary | ICD-10-CM | POA: Diagnosis not present

## 2020-01-05 DIAGNOSIS — G319 Degenerative disease of nervous system, unspecified: Secondary | ICD-10-CM | POA: Diagnosis not present

## 2020-01-05 DIAGNOSIS — J449 Chronic obstructive pulmonary disease, unspecified: Secondary | ICD-10-CM | POA: Diagnosis not present

## 2020-01-07 DIAGNOSIS — G4730 Sleep apnea, unspecified: Secondary | ICD-10-CM | POA: Diagnosis not present

## 2020-01-07 DIAGNOSIS — T7840XD Allergy, unspecified, subsequent encounter: Secondary | ICD-10-CM | POA: Diagnosis not present

## 2020-01-07 DIAGNOSIS — Z471 Aftercare following joint replacement surgery: Secondary | ICD-10-CM | POA: Diagnosis not present

## 2020-01-07 DIAGNOSIS — G319 Degenerative disease of nervous system, unspecified: Secondary | ICD-10-CM | POA: Diagnosis not present

## 2020-01-07 DIAGNOSIS — Z7901 Long term (current) use of anticoagulants: Secondary | ICD-10-CM | POA: Diagnosis not present

## 2020-01-07 DIAGNOSIS — K219 Gastro-esophageal reflux disease without esophagitis: Secondary | ICD-10-CM | POA: Diagnosis not present

## 2020-01-07 DIAGNOSIS — Z96651 Presence of right artificial knee joint: Secondary | ICD-10-CM | POA: Diagnosis not present

## 2020-01-07 DIAGNOSIS — I10 Essential (primary) hypertension: Secondary | ICD-10-CM | POA: Diagnosis not present

## 2020-01-07 DIAGNOSIS — J449 Chronic obstructive pulmonary disease, unspecified: Secondary | ICD-10-CM | POA: Diagnosis not present

## 2020-01-09 DIAGNOSIS — K219 Gastro-esophageal reflux disease without esophagitis: Secondary | ICD-10-CM | POA: Diagnosis not present

## 2020-01-09 DIAGNOSIS — Z471 Aftercare following joint replacement surgery: Secondary | ICD-10-CM | POA: Diagnosis not present

## 2020-01-09 DIAGNOSIS — G4730 Sleep apnea, unspecified: Secondary | ICD-10-CM | POA: Diagnosis not present

## 2020-01-09 DIAGNOSIS — Z96651 Presence of right artificial knee joint: Secondary | ICD-10-CM | POA: Diagnosis not present

## 2020-01-09 DIAGNOSIS — Z7901 Long term (current) use of anticoagulants: Secondary | ICD-10-CM | POA: Diagnosis not present

## 2020-01-09 DIAGNOSIS — G319 Degenerative disease of nervous system, unspecified: Secondary | ICD-10-CM | POA: Diagnosis not present

## 2020-01-09 DIAGNOSIS — J449 Chronic obstructive pulmonary disease, unspecified: Secondary | ICD-10-CM | POA: Diagnosis not present

## 2020-01-09 DIAGNOSIS — T7840XD Allergy, unspecified, subsequent encounter: Secondary | ICD-10-CM | POA: Diagnosis not present

## 2020-01-09 DIAGNOSIS — I10 Essential (primary) hypertension: Secondary | ICD-10-CM | POA: Diagnosis not present

## 2020-01-12 DIAGNOSIS — I10 Essential (primary) hypertension: Secondary | ICD-10-CM | POA: Diagnosis not present

## 2020-01-12 DIAGNOSIS — K219 Gastro-esophageal reflux disease without esophagitis: Secondary | ICD-10-CM | POA: Diagnosis not present

## 2020-01-12 DIAGNOSIS — T7840XD Allergy, unspecified, subsequent encounter: Secondary | ICD-10-CM | POA: Diagnosis not present

## 2020-01-12 DIAGNOSIS — J449 Chronic obstructive pulmonary disease, unspecified: Secondary | ICD-10-CM | POA: Diagnosis not present

## 2020-01-12 DIAGNOSIS — Z96651 Presence of right artificial knee joint: Secondary | ICD-10-CM | POA: Diagnosis not present

## 2020-01-12 DIAGNOSIS — Z7901 Long term (current) use of anticoagulants: Secondary | ICD-10-CM | POA: Diagnosis not present

## 2020-01-12 DIAGNOSIS — G319 Degenerative disease of nervous system, unspecified: Secondary | ICD-10-CM | POA: Diagnosis not present

## 2020-01-12 DIAGNOSIS — G4730 Sleep apnea, unspecified: Secondary | ICD-10-CM | POA: Diagnosis not present

## 2020-01-12 DIAGNOSIS — Z471 Aftercare following joint replacement surgery: Secondary | ICD-10-CM | POA: Diagnosis not present

## 2020-01-14 DIAGNOSIS — G4730 Sleep apnea, unspecified: Secondary | ICD-10-CM | POA: Diagnosis not present

## 2020-01-14 DIAGNOSIS — I10 Essential (primary) hypertension: Secondary | ICD-10-CM | POA: Diagnosis not present

## 2020-01-14 DIAGNOSIS — Z7901 Long term (current) use of anticoagulants: Secondary | ICD-10-CM | POA: Diagnosis not present

## 2020-01-14 DIAGNOSIS — G319 Degenerative disease of nervous system, unspecified: Secondary | ICD-10-CM | POA: Diagnosis not present

## 2020-01-14 DIAGNOSIS — K219 Gastro-esophageal reflux disease without esophagitis: Secondary | ICD-10-CM | POA: Diagnosis not present

## 2020-01-14 DIAGNOSIS — Z96651 Presence of right artificial knee joint: Secondary | ICD-10-CM | POA: Diagnosis not present

## 2020-01-14 DIAGNOSIS — Z471 Aftercare following joint replacement surgery: Secondary | ICD-10-CM | POA: Diagnosis not present

## 2020-01-14 DIAGNOSIS — T7840XD Allergy, unspecified, subsequent encounter: Secondary | ICD-10-CM | POA: Diagnosis not present

## 2020-01-14 DIAGNOSIS — J449 Chronic obstructive pulmonary disease, unspecified: Secondary | ICD-10-CM | POA: Diagnosis not present

## 2020-01-15 DIAGNOSIS — M6281 Muscle weakness (generalized): Secondary | ICD-10-CM | POA: Diagnosis not present

## 2020-01-15 DIAGNOSIS — M25561 Pain in right knee: Secondary | ICD-10-CM | POA: Diagnosis not present

## 2020-01-15 DIAGNOSIS — Z96651 Presence of right artificial knee joint: Secondary | ICD-10-CM | POA: Diagnosis not present

## 2020-01-15 DIAGNOSIS — G8929 Other chronic pain: Secondary | ICD-10-CM | POA: Diagnosis not present

## 2020-01-15 DIAGNOSIS — M25661 Stiffness of right knee, not elsewhere classified: Secondary | ICD-10-CM | POA: Diagnosis not present

## 2020-01-19 DIAGNOSIS — M6281 Muscle weakness (generalized): Secondary | ICD-10-CM | POA: Diagnosis not present

## 2020-01-19 DIAGNOSIS — M25561 Pain in right knee: Secondary | ICD-10-CM | POA: Diagnosis not present

## 2020-01-19 DIAGNOSIS — M25661 Stiffness of right knee, not elsewhere classified: Secondary | ICD-10-CM | POA: Diagnosis not present

## 2020-01-19 DIAGNOSIS — Z96651 Presence of right artificial knee joint: Secondary | ICD-10-CM | POA: Diagnosis not present

## 2020-01-19 DIAGNOSIS — G8929 Other chronic pain: Secondary | ICD-10-CM | POA: Diagnosis not present

## 2020-01-21 DIAGNOSIS — M25561 Pain in right knee: Secondary | ICD-10-CM | POA: Diagnosis not present

## 2020-01-21 DIAGNOSIS — G8929 Other chronic pain: Secondary | ICD-10-CM | POA: Diagnosis not present

## 2020-01-21 DIAGNOSIS — M6281 Muscle weakness (generalized): Secondary | ICD-10-CM | POA: Diagnosis not present

## 2020-01-21 DIAGNOSIS — M25661 Stiffness of right knee, not elsewhere classified: Secondary | ICD-10-CM | POA: Diagnosis not present

## 2020-01-21 DIAGNOSIS — Z96651 Presence of right artificial knee joint: Secondary | ICD-10-CM | POA: Diagnosis not present

## 2020-01-26 DIAGNOSIS — Z96651 Presence of right artificial knee joint: Secondary | ICD-10-CM | POA: Diagnosis not present

## 2020-02-02 DIAGNOSIS — M25561 Pain in right knee: Secondary | ICD-10-CM | POA: Diagnosis not present

## 2020-02-02 DIAGNOSIS — M25661 Stiffness of right knee, not elsewhere classified: Secondary | ICD-10-CM | POA: Diagnosis not present

## 2020-02-02 DIAGNOSIS — Z96651 Presence of right artificial knee joint: Secondary | ICD-10-CM | POA: Diagnosis not present

## 2020-02-02 DIAGNOSIS — G8929 Other chronic pain: Secondary | ICD-10-CM | POA: Diagnosis not present

## 2020-02-02 DIAGNOSIS — M6281 Muscle weakness (generalized): Secondary | ICD-10-CM | POA: Diagnosis not present

## 2020-02-05 DIAGNOSIS — G8929 Other chronic pain: Secondary | ICD-10-CM | POA: Diagnosis not present

## 2020-02-05 DIAGNOSIS — G4733 Obstructive sleep apnea (adult) (pediatric): Secondary | ICD-10-CM | POA: Diagnosis not present

## 2020-02-05 DIAGNOSIS — Z96651 Presence of right artificial knee joint: Secondary | ICD-10-CM | POA: Diagnosis not present

## 2020-02-05 DIAGNOSIS — M25561 Pain in right knee: Secondary | ICD-10-CM | POA: Diagnosis not present

## 2020-02-05 DIAGNOSIS — M25661 Stiffness of right knee, not elsewhere classified: Secondary | ICD-10-CM | POA: Diagnosis not present

## 2020-02-05 DIAGNOSIS — M6281 Muscle weakness (generalized): Secondary | ICD-10-CM | POA: Diagnosis not present

## 2020-02-10 ENCOUNTER — Encounter: Payer: Self-pay | Admitting: Family Medicine

## 2020-02-11 DIAGNOSIS — M25561 Pain in right knee: Secondary | ICD-10-CM | POA: Diagnosis not present

## 2020-02-11 DIAGNOSIS — M1711 Unilateral primary osteoarthritis, right knee: Secondary | ICD-10-CM | POA: Diagnosis not present

## 2020-02-13 ENCOUNTER — Ambulatory Visit (INDEPENDENT_AMBULATORY_CARE_PROVIDER_SITE_OTHER): Payer: Medicare Other | Admitting: Family Medicine

## 2020-02-13 ENCOUNTER — Other Ambulatory Visit: Payer: Self-pay

## 2020-02-13 VITALS — BP 140/78 | HR 75 | Temp 97.4°F | Ht 59.0 in | Wt 153.8 lb

## 2020-02-13 DIAGNOSIS — R5383 Other fatigue: Secondary | ICD-10-CM

## 2020-02-13 DIAGNOSIS — E6609 Other obesity due to excess calories: Secondary | ICD-10-CM | POA: Diagnosis not present

## 2020-02-13 DIAGNOSIS — Z6831 Body mass index (BMI) 31.0-31.9, adult: Secondary | ICD-10-CM

## 2020-02-13 DIAGNOSIS — D649 Anemia, unspecified: Secondary | ICD-10-CM

## 2020-02-13 DIAGNOSIS — R21 Rash and other nonspecific skin eruption: Secondary | ICD-10-CM

## 2020-02-13 LAB — FERRITIN: Ferritin: 66.9 ng/mL (ref 10.0–291.0)

## 2020-02-13 LAB — CBC WITH DIFFERENTIAL/PLATELET
Basophils Absolute: 0.1 10*3/uL (ref 0.0–0.1)
Basophils Relative: 0.6 % (ref 0.0–3.0)
Eosinophils Absolute: 0.3 10*3/uL (ref 0.0–0.7)
Eosinophils Relative: 2.9 % (ref 0.0–5.0)
HCT: 34 % — ABNORMAL LOW (ref 36.0–46.0)
Hemoglobin: 11.2 g/dL — ABNORMAL LOW (ref 12.0–15.0)
Lymphocytes Relative: 15.9 % (ref 12.0–46.0)
Lymphs Abs: 1.5 10*3/uL (ref 0.7–4.0)
MCHC: 33 g/dL (ref 30.0–36.0)
MCV: 74.3 fl — ABNORMAL LOW (ref 78.0–100.0)
Monocytes Absolute: 0.8 10*3/uL (ref 0.1–1.0)
Monocytes Relative: 8.1 % (ref 3.0–12.0)
Neutro Abs: 6.9 10*3/uL (ref 1.4–7.7)
Neutrophils Relative %: 72.5 % (ref 43.0–77.0)
Platelets: 537 10*3/uL — ABNORMAL HIGH (ref 150.0–400.0)
RBC: 4.57 Mil/uL (ref 3.87–5.11)
RDW: 15.6 % — ABNORMAL HIGH (ref 11.5–15.5)
WBC: 9.5 10*3/uL (ref 4.0–10.5)

## 2020-02-13 LAB — COMPREHENSIVE METABOLIC PANEL WITH GFR
ALT: 9 U/L (ref 0–35)
AST: 9 U/L (ref 0–37)
Albumin: 3.9 g/dL (ref 3.5–5.2)
Alkaline Phosphatase: 120 U/L — ABNORMAL HIGH (ref 39–117)
BUN: 22 mg/dL (ref 6–23)
CO2: 30 meq/L (ref 19–32)
Calcium: 9.7 mg/dL (ref 8.4–10.5)
Chloride: 99 meq/L (ref 96–112)
Creatinine, Ser: 0.72 mg/dL (ref 0.40–1.20)
GFR: 80.1 mL/min
Glucose, Bld: 109 mg/dL — ABNORMAL HIGH (ref 70–99)
Potassium: 3.7 meq/L (ref 3.5–5.1)
Sodium: 136 meq/L (ref 135–145)
Total Bilirubin: 0.2 mg/dL (ref 0.2–1.2)
Total Protein: 7.4 g/dL (ref 6.0–8.3)

## 2020-02-13 MED ORDER — BETAMETHASONE DIPROPIONATE 0.05 % EX CREA: TOPICAL_CREAM | Freq: Two times a day (BID) | CUTANEOUS | 1 refills | Status: DC

## 2020-02-13 NOTE — Patient Instructions (Addendum)
Good to see you today  I will notify you of lab results  Please schedule follow up for Feb.

## 2020-02-13 NOTE — Progress Notes (Signed)
Subjective:    Patient ID: Erin Petersen, female    DOB: [birth year], 79 y.o.   MRN: [id number]  HPI Chief Complaint  Patient presents with  . Rash    Abdominal area x months    This is a 79 yo female who presetns today with above cc. She  has a past medical history of Arthritis, Asthma, Bacteremia due to Gram-negative bacteria (06/04/2019), Colon polyps, Duodenal ulcer, Dyspnea, GERD (gastroesophageal reflux disease), Hyperlipidemia, Hypertension, Neuropathy, Plantar fasciitis, Pre-diabetes, Prediabetes, RLQ abdominal pain, RLS (restless legs syndrome), Sleep apnea, SOB (shortness of breath), Urinary incontinence, Urinary tract infection, and Wears hearing aid in right ear.  She had a right knee replacement 6 weeks ago, has been doing well. Has been fatigued since surgery. No change in SOB, no palpitations, occasional lightheadedness.   Has an iron prescription, fusion plus- makes her very nauseous.   Fosamax- also causes brief stomach upset. Her insurance would not cover Prolia.   Intermittent rash of abdomen, for several months. Noticed after hospitalization 5/21. Was worse immediately following hospital discharge. Has never completely resolved, but it is not as bad as it was. Itchy. Limited to abdomen. No known contact irritant. Has used benadryl cream with some improvement. She has photos of rash at its worse- erythematous, macular rash of lower abdomen.   Weight loss- has been working on [employer], some weight loss related to recent surgery. Feels good. Energy improved.   Review of Systems Denies headache, chest pain, no change in SOB, no abdominal pain, diarrhea/ constipation, leg swelling    Objective:   Physical Exam Physical Exam  Constitutional: Oriented to person, place, and time. Appears well-developed and well-nourished. Obese.  HENT:  Head: Normocephalic and atraumatic.  Eyes: Conjunctivae are normal.  Neck: Normal range of motion. Neck supple.   Cardiovascular: Normal rate, regular rhythm and normal heart sounds.   Pulmonary/Chest: Effort normal and breath sounds normal.  Musculoskeletal: No lower extremity edema.   Neurological: Alert and oriented to person, place, and time.  Skin: Skin is warm and dry. Mildly erythematous, confluent, macular rash of lower abdomen.  Psychiatric: Normal mood and affect. Behavior is normal. Judgment and thought content normal.  Vitals reviewed.     BP 140/78   Pulse 75   Temp (!) 97.4 F (36.3 C) (Temporal)   Ht 4' 11"  (1.499 m)   Wt 153 lb 12 oz (69.7 kg)   LMP  (LMP Unknown)   SpO2 98%   BMI 31.05 kg/m  Wt Readings from Last 3 Encounters:  02/13/20 153 lb 12 oz (69.7 kg)  01/01/20 160 lb 15 oz (73 kg)  10/21/19 163 lb 2 oz (74 kg)       Assessment & Plan:  1. Anemia, unspecified type - CBC with Differential - Ferritin - Comprehensive metabolic panel  2. Fatigue, unspecified type - discussed recovery from surgery, expectations - CBC with Differential - Ferritin - Comprehensive metabolic panel  3. Rash - betamethasone dipropionate 0.05 % cream; Apply topically 2 (two) times daily. No more than 10 days in a row.  Dispense: 30 g; Refill: 1  4. Class 1 obesity due to excess calories without serious comorbidity with body mass index (BMI) of 31.0 to 31.9 in adult - she has lost about 17 pounds in last year, feels better, encouraged her to continue with healthy food choices, exercise as tolerated s/p knee surgery  - follow up in 3 months for TOC to new provider  This visit occurred  during the SARS-CoV-2 public health emergency.  Safety protocols were in place, including screening questions prior to the visit, additional usage of staff PPE, and extensive cleaning of exam room while observing appropriate contact time as indicated for disinfecting solutions.      Clarene Reamer, FNP-BC  [facility]  02/17/2020 6:41 AM

## 2020-02-17 ENCOUNTER — Encounter: Payer: Self-pay | Admitting: Family Medicine

## 2020-02-26 ENCOUNTER — Other Ambulatory Visit: Payer: Self-pay

## 2020-02-26 ENCOUNTER — Other Ambulatory Visit (HOSPITAL_COMMUNITY): Payer: Self-pay | Admitting: Orthopedic Surgery

## 2020-02-26 ENCOUNTER — Ambulatory Visit
Admission: RE | Admit: 2020-02-26 | Discharge: 2020-02-26 | Disposition: A | Discharge status: Home / Self Care | Payer: Medicare Other | Source: Ambulatory Visit | Attending: Orthopedic Surgery | Admitting: Orthopedic Surgery

## 2020-02-26 ENCOUNTER — Other Ambulatory Visit: Payer: Self-pay | Admitting: Orthopedic Surgery

## 2020-02-26 DIAGNOSIS — M79661 Pain in right lower leg: Secondary | ICD-10-CM | POA: Insufficient documentation

## 2020-02-26 DIAGNOSIS — Z96651 Presence of right artificial knee joint: Secondary | ICD-10-CM | POA: Diagnosis not present

## 2020-03-02 ENCOUNTER — Other Ambulatory Visit: Payer: Self-pay | Admitting: Family Medicine

## 2020-03-02 NOTE — Telephone Encounter (Signed)
I looked on [facility]-this actually comes from neurology.  Since it is controlled-needs to come from them

## 2020-03-02 NOTE — Telephone Encounter (Signed)
Note says Rx is for Lyrica. Only refill I see Tor Netters, NP was on 04/29/19 #20 caps 0 refills, TOC appt scheduled with Dr. Glori Bickers on 03/12/20

## 2020-03-02 NOTE — Telephone Encounter (Signed)
Pt called in needed to get a prescription and [facility]

## 2020-03-12 ENCOUNTER — Other Ambulatory Visit: Payer: Self-pay

## 2020-03-12 ENCOUNTER — Ambulatory Visit (INDEPENDENT_AMBULATORY_CARE_PROVIDER_SITE_OTHER): Payer: Medicare Other | Admitting: Family Medicine

## 2020-03-12 ENCOUNTER — Encounter: Payer: Self-pay | Admitting: Family Medicine

## 2020-03-12 VITALS — BP 120/65 | HR 74 | Temp 97.1°F | Ht <= 58 in | Wt 156.1 lb

## 2020-03-12 DIAGNOSIS — M810 Age-related osteoporosis without current pathological fracture: Secondary | ICD-10-CM

## 2020-03-12 DIAGNOSIS — I10 Essential (primary) hypertension: Secondary | ICD-10-CM | POA: Diagnosis not present

## 2020-03-12 DIAGNOSIS — Z9989 Dependence on other enabling machines and devices: Secondary | ICD-10-CM

## 2020-03-12 DIAGNOSIS — N3010 Interstitial cystitis (chronic) without hematuria: Secondary | ICD-10-CM

## 2020-03-12 DIAGNOSIS — Z23 Encounter for immunization: Secondary | ICD-10-CM

## 2020-03-12 DIAGNOSIS — M1991 Primary osteoarthritis, unspecified site: Secondary | ICD-10-CM

## 2020-03-12 DIAGNOSIS — J309 Allergic rhinitis, unspecified: Secondary | ICD-10-CM

## 2020-03-12 DIAGNOSIS — G43909 Migraine, unspecified, not intractable, without status migrainosus: Secondary | ICD-10-CM | POA: Insufficient documentation

## 2020-03-12 DIAGNOSIS — G629 Polyneuropathy, unspecified: Secondary | ICD-10-CM | POA: Diagnosis not present

## 2020-03-12 DIAGNOSIS — R7303 Prediabetes: Secondary | ICD-10-CM | POA: Diagnosis not present

## 2020-03-12 DIAGNOSIS — Z91030 Bee allergy status: Secondary | ICD-10-CM

## 2020-03-12 DIAGNOSIS — K589 Irritable bowel syndrome without diarrhea: Secondary | ICD-10-CM

## 2020-03-12 DIAGNOSIS — G4733 Obstructive sleep apnea (adult) (pediatric): Secondary | ICD-10-CM

## 2020-03-12 DIAGNOSIS — E7800 Pure hypercholesterolemia, unspecified: Secondary | ICD-10-CM | POA: Diagnosis not present

## 2020-03-12 DIAGNOSIS — E559 Vitamin D deficiency, unspecified: Secondary | ICD-10-CM

## 2020-03-12 DIAGNOSIS — R0602 Shortness of breath: Secondary | ICD-10-CM

## 2020-03-12 LAB — LIPID PANEL
Cholesterol: 143 mg/dL (ref 0–200)
HDL: 41.9 mg/dL (ref >39.00)
LDL Cholesterol: 67 mg/dL (ref 0–99)
NonHDL: 101.45
Total CHOL/HDL Ratio: 3
Triglycerides: 174 mg/dL — ABNORMAL HIGH (ref 0.0–149.0)
VLDL: 34.8 mg/dL (ref 0.0–40.0)

## 2020-03-12 LAB — HEMOGLOBIN A1C: Hgb A1c MFr Bld: 6.4 % (ref 4.6–6.5)

## 2020-03-12 MED ORDER — EPINEPHRINE 0.3 MG/0.3ML IJ SOAJ
0.3000 mg | INTRAMUSCULAR | 1 refills | Status: DC | PRN
Start: 2020-03-12 — End: 2020-10-14

## 2020-03-12 MED ORDER — HYDROCHLOROTHIAZIDE 25 MG PO TABS: 25.0000 mg | ORAL_TABLET | Freq: Every day | ORAL | 3 refills | Status: DC

## 2020-03-12 MED ORDER — ROPINIROLE HCL 4 MG PO TABS: 4.0000 mg | ORAL_TABLET | Freq: Every day | ORAL | 3 refills | Status: DC

## 2020-03-12 MED ORDER — AMITRIPTYLINE HCL 25 MG PO TABS: 25.0000 mg | ORAL_TABLET | Freq: Every day | ORAL | 3 refills | Status: DC

## 2020-03-12 MED ORDER — SIMVASTATIN 20 MG PO TABS: 20.0000 mg | ORAL_TABLET | Freq: Every day | ORAL | 3 refills | Status: DC

## 2020-03-12 MED ORDER — FLUTICASONE PROPIONATE 50 MCG/ACT NA SUSP: 2.0000 | Freq: Every day | NASAL | 3 refills | Status: DC

## 2020-03-12 MED ORDER — ROPINIROLE HCL 1 MG PO TABS: ORAL_TABLET | ORAL | 0 refills | Status: DC

## 2020-03-12 MED ORDER — MELOXICAM 15 MG PO TABS: 15.0000 mg | ORAL_TABLET | Freq: Every day | ORAL | 3 refills | Status: DC

## 2020-03-12 MED ORDER — HYDROXYZINE HCL 25 MG PO TABS: 25.0000 mg | ORAL_TABLET | Freq: Every day | ORAL | 3 refills | Status: DC

## 2020-03-12 NOTE — Patient Instructions (Addendum)
Stop the alendronate due to the nausea  If nausea does not get better- hold the meloxicam for 2 days  Then if still not better , go to GI doctor   Check and see the ins covers prolia   I will place a referral to pulmonary

## 2020-03-12 NOTE — Progress Notes (Signed)
Subjective:    Patient ID: Erin Petersen, female    DOB: 03/14/1940, 80 y.o.   MRN: [id number]  This visit occurred during the SARS-CoV-2 public health emergency.  Safety protocols were in place, including screening questions prior to the visit, additional usage of staff PPE, and extensive cleaning of exam room while observing appropriate contact time as indicated for disinfecting solutions.    HPI 80 yo pf of Dr Carlean Purl presents to est care   Wt Readings from Last 3 Encounters:  03/12/20 156 lb 2 oz (70.8 kg)  02/13/20 153 lb 12 oz (69.7 kg)  01/01/20 160 lb 15 oz (73 kg)   33.20 kg/m   Fully vaccinated for covid-will bring card for dates Wants a flu vaccine today  New insurance -needs refills    History of HTN  bp is stable today  No cp or palpitations or headaches or edema  No side effects to medicines  BP Readings from Last 3 Encounters:  03/12/20 (!) 146/72  02/13/20 140/78  01/04/20 (!) 142/59     Takes amlodipine 5 mg daily hctz 25 mg daily    OSA with cpap Uses [employer] for supplies    Neuropathy  lyrica 150 mg twice daily -from her neurologist   She has meloxicam from previous place ([facility])  She takes this for arthritis     Anemia Lab Results  Component Value Date   WBC 9.5 02/13/2020   HGB 11.2 (L) 02/13/2020   HCT 34.0 (L) 02/13/2020   MCV 74.3 (L) 02/13/2020   PLT 537.0 (H) 02/13/2020   Lab Results  Component Value Date   FERRITIN 66.9 02/13/2020   Lab Results  Component Value Date   CREATININE 0.72 02/13/2020   BUN 22 02/13/2020   NA 136 02/13/2020   K 3.7 02/13/2020   CL 99 02/13/2020   CO2 30 02/13/2020   Lab Results  Component Value Date   ALT 9 02/13/2020   AST 9 02/13/2020   ALKPHOS 120 (H) 02/13/2020   BILITOT 0.2 02/13/2020    Hyperlipidemia Lab Results  Component Value Date   CHOL 179 04/21/2019   HDL 54.90 04/21/2019   LDLCALC 97 04/21/2019   TRIG 135.0 04/21/2019   CHOLHDL 3 04/21/2019   Takes zocor 20 mg  daily  Diet is pretty good - low fat   Prediabetes Lab Results  Component Value Date   HGBA1C 6.3 04/21/2019  needs labs Eating low carb -thinks it will be better  Does exercise -(just had a knee replacement) -doing PT and just starting to walk   IC-takes elavil 25 mg  Hydroxyzine prn   She takes for rls  ropirinole 1 mg bid and then 4 mg at night   Tramadol 50 mg -she takes occ for headache/last had it for knee pain  She takes 1-2 per month for migraine   Chronic constipation- has linzess  Also miralax  Sees GI Worse after recent surgery   Alendronate for osteo- nausea  dexa was 11/20    Patient Active Problem List   Diagnosis Date Noted  . Migraine 03/12/2020  . IBS (irritable bowel syndrome) 03/12/2020  . S/P TKR (total knee replacement) using cement, right 01/01/2020  . History of vaginal hysterectomy 06/11/2019  . Prediabetes 04/22/2019  . Estrogen deficiency 04/22/2019  . Urinary frequency 04/22/2019  . History of skin cancer 04/22/2019  . High-tone pelvic floor dysfunction 04/03/2019  . IC (interstitial cystitis) 04/03/2019  . Neuralgia of both pudendal nerves  04/03/2019  . Pelvic pain in female 04/03/2019  . Osteoporosis 02/12/2019  . Asthma 01/31/2019  . Adverse reaction to influenza vaccine, initial encounter 12/03/2018  . Vitamin D deficiency 12/03/2018  . Restless legs syndrome 11/28/2018  . Small fiber neuropathy 11/28/2018  . Allergic rhinitis 10/08/2018  . Benign essential hypertension 10/08/2018  . Chronic bladder pain 10/08/2018  . Neuropathy 10/08/2018  . Osteoarthritis 10/08/2018  . S/P BSO (bilateral salpingo-oophorectomy) 01/07/2018  . OSA on CPAP 12/29/2016  . Shortness of breath 06/24/2012  . Other and unspecified disc disorder of lumbar region 10/25/2000  . High cholesterol 09/24/1998  . Tinnitus of both ears 09/24/1983   Past Medical History:  Diagnosis Date  . Arthritis   . Asthma    exercise induced asthma  . Bacteremia due  to Gram-negative bacteria 06/04/2019  . Colon polyps   . Duodenal ulcer   . Dyspnea   . GERD (gastroesophageal reflux disease)   . Hyperlipidemia   . Hypertension   . Neuropathy    right leg and foot  . Plantar fasciitis   . Pre-diabetes   . Prediabetes   . RLQ abdominal pain   . RLS (restless legs syndrome)   . Sleep apnea    CPAP  . SOB (shortness of breath)   . Urinary incontinence   . Urinary tract infection   . Wears hearing aid in right ear    Past Surgical History:  Procedure Laterality Date  . ABDOMINAL HYSTERECTOMY  1972  . APPENDECTOMY  1958  . BACK SURGERY     lumbar fusion  . BILATERAL SALPINGOOPHORECTOMY  01/07/2018  . BIOPSY  04/15/2019   Procedure: BIOPSY;  Surgeon: [name], MD;  Location: [place];  Service: Endoscopy;;  . CARDIAC CATHETERIZATION  2019  . COLONOSCOPY WITH PROPOFOL N/A 11/11/2018   Procedure: COLONOSCOPY WITH PROPOFOL;  Surgeon: [name], MD;  Location: [facility];  Service: Gastroenterology;  Laterality: N/A;  . CYSTO WITH HYDRODISTENSION N/A 12/30/2018   Procedure: CYSTOSCOPY/HYDRODISTENSION WITH MARCAINE;  Surgeon: Bjorn Loser, MD;  Location: [facility];  Service: Urology;  Laterality: N/A;  . ESOPHAGOGASTRODUODENOSCOPY (EGD) WITH PROPOFOL N/A 04/15/2019   Procedure: ESOPHAGOGASTRODUODENOSCOPY (EGD) WITH PROPOFOL;  Surgeon: [name], MD;  Location: [place];  Service: Endoscopy;  Laterality: N/A;  sleep apnea  . ESOPHAGOGASTRODUODENOSCOPY (EGD) WITH PROPOFOL N/A 07/09/2019   Procedure: ESOPHAGOGASTRODUODENOSCOPY (EGD) WITH PROPOFOL;  Surgeon: [name], MD;  Location: [place];  Service: Gastroenterology;  Laterality: N/A;  . EYE SURGERY Bilateral   . HERNIA REPAIR     abd  . HERNIA REPAIR     umbilical  . INGUINAL HERNIA REPAIR Bilateral    inguinal  . LAPAROSCOPIC BILATERAL SALPINGO OOPHERECTOMY    . SPINAL FUSION  2002  . TONSILLECTOMY AND ADENOIDECTOMY  1961   . TOTAL KNEE ARTHROPLASTY Right 01/01/2020   Procedure: Right total knee arthroplasty - [name] to Assist;  Surgeon: Hessie Knows, MD;  Location: [facility];  Service: Orthopedics;  Laterality: Right;  . TUBAL LIGATION     Social History   Tobacco Use  . Smoking status: Never Smoker  . Smokeless tobacco: Never Used  Vaping Use  . Vaping Use: Never used  Substance Use Topics  . Alcohol use: Not Currently  . Drug use: Never   Family History  Problem Relation Age of Onset  . Alcohol abuse Mother   . Cancer Mother   . Stroke Mother   . Alcohol abuse Father   .  Arthritis Sister   . Arthritis Daughter   . COPD Daughter   . Arthritis Paternal Grandmother   . Asthma Paternal Grandmother   . COPD Paternal Grandmother   . Breast cancer Paternal Aunt    Allergies  Allergen Reactions  . Other Anaphylaxis    Bee Sting   . Fosamax [Alendronate]     Nausea   . Ciprofloxacin Hcl Rash    Mild rash after long term use  . Sulfa Antibiotics Rash    Bactrim   Current Outpatient Medications on File Prior to Visit  Medication Sig Dispense Refill  . acetaminophen (TYLENOL) 650 MG CR tablet Take 1,300 mg by mouth every 8 (eight) hours as needed for pain.     Marland Kitchen albuterol (VENTOLIN HFA) 108 (90 Base) MCG/ACT inhaler Inhale 2 puffs into the lungs every 6 (six) hours as needed for wheezing or shortness of breath.    Marland Kitchen alendronate (FOSAMAX) 70 MG tablet Take 70 mg by mouth once a week. Take with a full glass of water on an empty stomach.    Marland Kitchen amLODipine (NORVASC) 5 MG tablet Take 1 tablet (5 mg total) by mouth daily. 90 tablet 3  . aspirin 81 MG EC tablet Take 81 mg by mouth at bedtime.     . cephALEXin (KEFLEX) 500 MG capsule Take 4 pills one hour prior to dental appointment.    . Cholecalciferol (VITAMIN D3) 50 MCG (2000 UT) TABS Take 2,000 Units by mouth daily.     . Cyanocobalamin (VITAMIN B-12) 3000 MCG SUBL Take 3,000 mcg by mouth daily.    Marland Kitchen dicyclomine (BENTYL) 10 MG capsule TAKE 1  CAPSULE BY MOUTH AT  BEDTIME (Patient taking differently: Take 10 mg by mouth at bedtime.) 90 capsule 1  . docusate sodium (COLACE) 100 MG capsule Take 1 capsule (100 mg total) by mouth 2 (two) times daily. 10 capsule 0  . estradiol (ESTRACE) 0.1 MG/GM vaginal cream Place 1 Applicatorful vaginally at bedtime.    Marland Kitchen LINZESS 145 MCG CAPS capsule TAKE 1 CAPSULE BY MOUTH  DAILY BEFORE BREAKFAST (Patient taking differently: Take 145 mcg by mouth daily before breakfast.) 90 capsule 3  . Melatonin 10 MG TABS Take 10 mg by mouth at bedtime.    . polyethylene glycol (MIRALAX / GLYCOLAX) 17 g packet Take 1 packet by mouth daily.     . pregabalin (LYRICA) 150 MG capsule Take 1 capsule (150 mg total) by mouth 2 (two) times daily. 20 capsule 0  . traMADol (ULTRAM) 50 MG tablet Take 1 tablet (50 mg total) by mouth every 6 (six) hours as needed. 30 tablet 0  . omeprazole (PRILOSEC) 40 MG capsule Take 1 capsule (40 mg total) by mouth daily before breakfast. 180 capsule 1   No current facility-administered medications on file prior to visit.    Review of Systems  Constitutional: Negative for activity change, appetite change, fatigue, fever and unexpected weight change.  HENT: Negative for congestion, ear pain, rhinorrhea, sinus pressure and sore throat.   Eyes: Negative for pain, redness and visual disturbance.  Respiratory: Negative for cough, shortness of breath and wheezing.        Chronic sob/exercise intolerance baseline   Cardiovascular: Negative for chest pain and palpitations.  Gastrointestinal: Positive for constipation. Negative for abdominal pain, blood in stool and diarrhea.  Endocrine: Negative for polydipsia and polyuria.  Genitourinary: Positive for dysuria. Negative for frequency and urgency.  Musculoskeletal: Positive for arthralgias and back pain. Negative for myalgias.  Skin:  Negative for pallor and rash.  Allergic/Immunologic: Negative for environmental allergies.  Neurological: Positive  for headaches. Negative for dizziness and syncope.  Hematological: Negative for adenopathy. Does not bruise/bleed easily.  Psychiatric/Behavioral: Negative for decreased concentration and dysphoric mood. The patient is not nervous/anxious.        Objective:   Physical Exam Constitutional:      General: She is not in acute distress.    Appearance: Normal appearance. She is obese. She is not ill-appearing.  Eyes:     General: No scleral icterus.    Conjunctiva/sclera: Conjunctivae normal.     Pupils: Pupils are equal, round, and reactive to light.  Cardiovascular:     Rate and Rhythm: Normal rate and regular rhythm.     Heart sounds: Normal heart sounds.  Pulmonary:     Effort: Pulmonary effort is normal. No respiratory distress.     Breath sounds: Normal breath sounds. No wheezing or rales.  Musculoskeletal:     Cervical back: Normal range of motion and neck supple.     Right lower leg: No edema.     Left lower leg: No edema.  Lymphadenopathy:     Cervical: No cervical adenopathy.  Neurological:     Mental Status: She is alert.     Cranial Nerves: No cranial nerve deficit.  Psychiatric:        Mood and Affect: Mood normal.     Comments: pleasant           Assessment & Plan:   Problem List Items Addressed This Visit      Cardiovascular and Mediastinum   Benign essential hypertension - Primary    bp in fair control at this time  BP Readings from Last 1 Encounters:  03/12/20 120/65   No changes needed Most recent labs reviewed  Disc lifstyle change with low sodium diet and exercise  Plan to continue amlodipine 5 mg daily and hctz 25 mg daily        Relevant Medications   EPINEPHrine 0.3 mg/0.3 mL IJ SOAJ injection   simvastatin (ZOCOR) 20 MG tablet   hydrochlorothiazide (HYDRODIURIL) 25 MG tablet   Migraine    Does not have often Takes tramadol (not my first choice) about 1-2 times pre mo  Elavil may help with prophylaxis      Relevant Medications    EPINEPHrine 0.3 mg/0.3 mL IJ SOAJ injection   simvastatin (ZOCOR) 20 MG tablet   amitriptyline (ELAVIL) 25 MG tablet   meloxicam (MOBIC) 15 MG tablet   hydrochlorothiazide (HYDRODIURIL) 25 MG tablet     Respiratory   Allergic rhinitis    Continues flonase ns daily      Relevant Medications   fluticasone (FLONASE) 50 MCG/ACT nasal spray   OSA on CPAP    Needs ref to new pulmonologist as hers left the practice  Ref done  Due for adjustment she thinks       Relevant Orders   Ambulatory referral to Pulmonology     Digestive   IBS (irritable bowel syndrome)    Pt sees GI Uses linzess and miralax for constipation  Worse after recent surgery  Encouraged a good fluid intake         Nervous and Auditory   Neuropathy    Sees neurology  Take lyrica 150 mg bid  Fair control        Musculoskeletal and Integument   Osteoporosis (Chronic)    Pt has been on alendronate but suffers from nausea and that  may be a side effect  Asked her to hold it  Last dexa 11/20 -will re check at 2 y  No falls/fractures  Takes ca and D Will check to see if prolia may be covered by her new ins and get back to [place]       Osteoarthritis    Pt takes meloxicam 15 mg daily  Disc pros/cons and risks of this med inst to take with food  Watch for GI side eff Watch for swelling/ inc bp and watch renal fxn      Relevant Medications   meloxicam (MOBIC) 15 MG tablet     Genitourinary   IC (interstitial cystitis)    Pt takes amitriptyline 25 mg and hydroxyzine 25 mg both at bedtime Disc sedation/fall risk with these Fairly well controlled        Other   High cholesterol    Disc goals for lipids and reasons to control them Rev last labs with pt Rev low sat fat diet in detail Takes simvastatin 20 mg daily   Labs drawn today for lipids       Relevant Medications   EPINEPHrine 0.3 mg/0.3 mL IJ SOAJ injection   simvastatin (ZOCOR) 20 MG tablet   hydrochlorothiazide (HYDRODIURIL) 25 MG  tablet   Other Relevant Orders   Lipid panel (Completed)   Shortness of breath    Pt notes chronic sob  Has had pulmonary w/u      Vitamin D deficiency    Level of 33.4 2/21 Taking vit D3 2000 iu daily  Disc imp to bone and overall health      Prediabetes    Due for a1c Drawn today  disc imp of low glycemic diet and wt loss to prevent DM2       Relevant Orders   Hemoglobin A1c (Completed)    Other Visit Diagnoses    Bee sting allergy       Relevant Medications   EPINEPHrine 0.3 mg/0.3 mL IJ SOAJ injection   Need for influenza vaccination       Relevant Orders   Flu Vaccine QUAD 6+ mos PF IM (Fluarix Quad PF) (Completed)

## 2020-03-13 ENCOUNTER — Encounter: Payer: Self-pay | Admitting: Family Medicine

## 2020-03-13 ENCOUNTER — Encounter: Payer: Self-pay | Admitting: Gastroenterology

## 2020-03-13 DIAGNOSIS — K269 Duodenal ulcer, unspecified as acute or chronic, without hemorrhage or perforation: Secondary | ICD-10-CM

## 2020-03-13 NOTE — Assessment & Plan Note (Signed)
Continues flonase ns daily

## 2020-03-13 NOTE — Assessment & Plan Note (Signed)
Does not have often Takes tramadol (not my first choice) about 1-2 times pre mo  Elavil may help with prophylaxis

## 2020-03-13 NOTE — Assessment & Plan Note (Signed)
Pt has been on alendronate but suffers from nausea and that may be a side effect  Asked her to hold it  Last dexa 11/20 -will re check at 2 y  No falls/fractures  Takes ca and D Will check to see if prolia may be covered by her new ins and get back to [place]

## 2020-03-13 NOTE — Assessment & Plan Note (Signed)
Pt takes meloxicam 15 mg daily  Disc pros/cons and risks of this med inst to take with food  Watch for GI side eff Watch for swelling/ inc bp and watch renal fxn

## 2020-03-13 NOTE — Assessment & Plan Note (Signed)
Pt sees GI Uses linzess and miralax for constipation  Worse after recent surgery  Encouraged a good fluid intake

## 2020-03-13 NOTE — Assessment & Plan Note (Signed)
Sees neurology  Take lyrica 150 mg bid  Fair control

## 2020-03-13 NOTE — Assessment & Plan Note (Signed)
Disc goals for lipids and reasons to control them Rev last labs with pt Rev low sat fat diet in detail Takes simvastatin 20 mg daily   Labs drawn today for lipids

## 2020-03-13 NOTE — Assessment & Plan Note (Signed)
Pt takes amitriptyline 25 mg and hydroxyzine 25 mg both at bedtime Disc sedation/fall risk with these Fairly well controlled

## 2020-03-13 NOTE — Assessment & Plan Note (Signed)
Level of 33.4 2/21 Taking vit D3 2000 iu daily  Disc imp to bone and overall health

## 2020-03-13 NOTE — Assessment & Plan Note (Signed)
Pt notes chronic sob  Has had pulmonary w/u

## 2020-03-13 NOTE — Assessment & Plan Note (Signed)
bp in fair control at this time  BP Readings from Last 1 Encounters:  03/12/20 120/65   No changes needed Most recent labs reviewed  Disc lifstyle change with low sodium diet and exercise  Plan to continue amlodipine 5 mg daily and hctz 25 mg daily

## 2020-03-13 NOTE — Assessment & Plan Note (Signed)
Needs ref to new pulmonologist as hers left the practice  Ref done  Due for adjustment she thinks

## 2020-03-13 NOTE — Assessment & Plan Note (Signed)
Due for a1c Drawn today  disc imp of low glycemic diet and wt loss to prevent DM2

## 2020-03-14 ENCOUNTER — Encounter: Payer: Self-pay | Admitting: Family Medicine

## 2020-03-15 ENCOUNTER — Encounter: Payer: Self-pay | Admitting: Family Medicine

## 2020-03-15 MED ORDER — OMEPRAZOLE 40 MG PO CPDR: 40.0000 mg | DELAYED_RELEASE_CAPSULE | Freq: Every day | ORAL | 1 refills | Status: DC

## 2020-03-15 MED ORDER — LINACLOTIDE 145 MCG PO CAPS: ORAL_CAPSULE | ORAL | 1 refills | Status: DC

## 2020-03-15 NOTE — Telephone Encounter (Signed)
Please advised about the Linzess   Last office visit 10/21/2019 Ulcer  Last refill Dicyclomine 12/01/2019 1 refills  Omeprazole 26m 07/09/2019 1 refills

## 2020-03-16 MED ORDER — AMLODIPINE BESYLATE 5 MG PO TABS: 5.0000 mg | ORAL_TABLET | Freq: Every day | ORAL | 3 refills | Status: DC

## 2020-03-31 ENCOUNTER — Institutional Professional Consult (permissible substitution): Payer: Medicare Other | Admitting: Primary Care

## 2020-04-08 ENCOUNTER — Ambulatory Visit: Payer: Medicare Other | Admitting: Internal Medicine

## 2020-04-08 ENCOUNTER — Other Ambulatory Visit: Payer: Self-pay

## 2020-04-08 ENCOUNTER — Telehealth: Payer: Self-pay

## 2020-04-08 ENCOUNTER — Encounter: Payer: Self-pay | Admitting: Internal Medicine

## 2020-04-08 VITALS — BP 146/74 | HR 83 | Temp 97.9°F | Resp 16 | Ht 59.0 in | Wt 161.0 lb

## 2020-04-08 DIAGNOSIS — G4733 Obstructive sleep apnea (adult) (pediatric): Secondary | ICD-10-CM | POA: Diagnosis not present

## 2020-04-08 DIAGNOSIS — Z7189 Other specified counseling: Secondary | ICD-10-CM | POA: Diagnosis not present

## 2020-04-08 DIAGNOSIS — J45990 Exercise induced bronchospasm: Secondary | ICD-10-CM | POA: Diagnosis not present

## 2020-04-08 NOTE — Telephone Encounter (Signed)
[name]. Erin Petersen

## 2020-04-08 NOTE — Patient Instructions (Signed)

## 2020-04-08 NOTE — Progress Notes (Signed)
[facility] [place], [place] [address]  Pulmonary Sleep Medicine   Office Visit Note  Patient Name: Erin Petersen DOB: 1940/04/11 MRN [id number]  Date of Service: 04/08/2020  Complaints/HPI: OSA moved from [place]. She had a home study done which reveals presence of MILD AHI of 12.8 per hour. Her machine is 79 years old and is on autoset.  She is now wanting new machine and she also wants to be reassessed for obstructive sleep apnea.  Patient states that she does feel fatigued she is not well rested on waking up in the morning.  Feels that the CPAP device is not effective at this time.  There is no morning headaches.  She denies falling asleep at the wheel.  Denies having any hallucinations no restlessness of the legs.  Other complaints include shortness of breath.  She has not had any recent pulmonary function evaluation done  ROS  General: (-) fever, (-) chills, (-) night sweats, (-) weakness Skin: (-) rashes, (-) itching,. Eyes: (-) visual changes, (-) redness, (-) itching. Nose and Sinuses: (-) nasal stuffiness or itchiness, (-) postnasal drip, (-) nosebleeds, (-) sinus trouble. Mouth and Throat: (-) sore throat, (-) hoarseness. Neck: (-) swollen glands, (-) enlarged thyroid, (-) neck pain. Respiratory: - cough, (-) bloody sputum, - shortness of breath, - wheezing. Cardiovascular: - ankle swelling, (-) chest pain. Lymphatic: (-) lymph node enlargement. Neurologic: (-) numbness, (-) tingling. Psychiatric: (-) anxiety, (-) depression   Current Medication: Outpatient Encounter Medications as of 04/08/2020  Medication Sig  . acetaminophen (TYLENOL) 650 MG CR tablet Take 1,300 mg by mouth every 8 (eight) hours as needed for pain.   Marland Kitchen albuterol (VENTOLIN HFA) 108 (90 Base) MCG/ACT inhaler Inhale 2 puffs into the lungs every 6 (six) hours as needed for wheezing or shortness of breath.  Marland Kitchen alendronate (FOSAMAX) 70 MG tablet Take 70 mg by mouth once a  week. Take with a full glass of water on an empty stomach.  Marland Kitchen amitriptyline (ELAVIL) 25 MG tablet Take 1 tablet (25 mg total) by mouth at bedtime.  Marland Kitchen amLODipine (NORVASC) 5 MG tablet Take 1 tablet (5 mg total) by mouth daily.  Marland Kitchen aspirin 81 MG EC tablet Take 81 mg by mouth at bedtime.   . cephALEXin (KEFLEX) 500 MG capsule Take 4 pills one hour prior to dental appointment.  . Cholecalciferol (VITAMIN D3) 50 MCG (2000 UT) TABS Take 2,000 Units by mouth daily.   . Cyanocobalamin (VITAMIN B-12) 3000 MCG SUBL Take 3,000 mcg by mouth daily.  Marland Kitchen dicyclomine (BENTYL) 10 MG capsule TAKE 1 CAPSULE BY MOUTH AT  BEDTIME (Patient taking differently: Take 10 mg by mouth at bedtime.)  . docusate sodium (COLACE) 100 MG capsule Take 1 capsule (100 mg total) by mouth 2 (two) times daily.  Marland Kitchen EPINEPHrine 0.3 mg/0.3 mL IJ SOAJ injection Inject 0.3 mg into the muscle as needed for anaphylaxis.  Marland Kitchen estradiol (ESTRACE) 0.1 MG/GM vaginal cream Place 1 Applicatorful vaginally at bedtime.  . fluticasone (FLONASE) 50 MCG/ACT nasal spray Place 2 sprays into both nostrils daily.  . hydrochlorothiazide (HYDRODIURIL) 25 MG tablet Take 1 tablet (25 mg total) by mouth daily.  . hydrOXYzine (ATARAX/VISTARIL) 25 MG tablet Take 1 tablet (25 mg total) by mouth at bedtime.  Marland Kitchen linaclotide (LINZESS) 145 MCG CAPS capsule TAKE 1 CAPSULE BY MOUTH  DAILY BEFORE BREAKFAST  . Melatonin 10 MG TABS Take 10 mg by mouth at bedtime.  . meloxicam (MOBIC) 15 MG tablet Take  1 tablet (15 mg total) by mouth daily. Take with food  . omeprazole (PRILOSEC) 40 MG capsule Take 1 capsule (40 mg total) by mouth daily before breakfast.  . polyethylene glycol (MIRALAX / GLYCOLAX) 17 g packet Take 1 packet by mouth daily.   . pregabalin (LYRICA) 150 MG capsule Take 1 capsule (150 mg total) by mouth 2 (two) times daily.  Marland Kitchen rOPINIRole (REQUIP) 1 MG tablet Take 1 pill in am by mouth, 1 pill in afternoon  . rOPINIRole (REQUIP) 4 MG tablet Take 1 tablet (4 mg total)  by mouth at bedtime.  . simvastatin (ZOCOR) 20 MG tablet Take 1 tablet (20 mg total) by mouth at bedtime.  . traMADol (ULTRAM) 50 MG tablet Take 1 tablet (50 mg total) by mouth every 6 (six) hours as needed.   No facility-administered encounter medications on file as of 04/08/2020.    Surgical History: Past Surgical History:  Procedure Laterality Date  . ABDOMINAL HYSTERECTOMY  1972  . APPENDECTOMY  1958  . BACK SURGERY     lumbar fusion  . BILATERAL SALPINGOOPHORECTOMY  01/07/2018  . BIOPSY  04/15/2019   Procedure: BIOPSY;  Surgeon: [name], MD;  Location: [place];  Service: Endoscopy;;  . CARDIAC CATHETERIZATION  2019  . COLONOSCOPY WITH PROPOFOL N/A 11/11/2018   Procedure: COLONOSCOPY WITH PROPOFOL;  Surgeon: [name], MD;  Location: [facility] ENDOSCOPY;  Service: Gastroenterology;  Laterality: N/A;  . CYSTO WITH HYDRODISTENSION N/A 12/30/2018   Procedure: CYSTOSCOPY/HYDRODISTENSION WITH MARCAINE;  Surgeon: Bjorn Loser, MD;  Location: [facility] ORS;  Service: Urology;  Laterality: N/A;  . ESOPHAGOGASTRODUODENOSCOPY (EGD) WITH PROPOFOL N/A 04/15/2019   Procedure: ESOPHAGOGASTRODUODENOSCOPY (EGD) WITH PROPOFOL;  Surgeon: [name], MD;  Location: [place];  Service: Endoscopy;  Laterality: N/A;  sleep apnea  . ESOPHAGOGASTRODUODENOSCOPY (EGD) WITH PROPOFOL N/A 07/09/2019   Procedure: ESOPHAGOGASTRODUODENOSCOPY (EGD) WITH PROPOFOL;  Surgeon: [name], MD;  Location: [place];  Service: Gastroenterology;  Laterality: N/A;  . EYE SURGERY Bilateral   . HERNIA REPAIR     abd  . HERNIA REPAIR     umbilical  . INGUINAL HERNIA REPAIR Bilateral    inguinal  . LAPAROSCOPIC BILATERAL SALPINGO OOPHERECTOMY    . SPINAL FUSION  2002  . TONSILLECTOMY AND ADENOIDECTOMY  1961  . TOTAL KNEE ARTHROPLASTY Right 01/01/2020   Procedure: Right total knee arthroplasty - [name] to Assist;  Surgeon: Hessie Knows, MD;  Location: [facility]  ORS;  Service: Orthopedics;  Laterality: Right;  . TUBAL LIGATION      Medical History: Past Medical History:  Diagnosis Date  . Arthritis   . Asthma    exercise induced asthma  . Bacteremia due to Gram-negative bacteria 06/04/2019  . Colon polyps   . Duodenal ulcer   . Dyspnea   . GERD (gastroesophageal reflux disease)   . Hyperlipidemia   . Hypertension   . Neuropathy    right leg and foot  . Plantar fasciitis   . Pre-diabetes   . Prediabetes   . RLQ abdominal pain   . RLS (restless legs syndrome)   . Sleep apnea    CPAP  . SOB (shortness of breath)   . Urinary incontinence   . Urinary tract infection   . Wears hearing aid in right ear     Family History: Family History  Problem Relation Age of Onset  . Alcohol abuse Mother   . Cancer Mother   . Stroke Mother   .  Alcohol abuse Father   . Arthritis Sister   . Arthritis Daughter   . COPD Daughter   . Arthritis Paternal Grandmother   . Asthma Paternal Grandmother   . COPD Paternal Grandmother   . Breast cancer Paternal Aunt     Social History: Social History   Socioeconomic History  . Marital status: Widowed    Spouse name: Not on file  . Number of children: 3  . Years of education: Not on file  . Highest education level: Bachelor's degree (e.g., BA, AB, BS)  Occupational History  . Not on file  Tobacco Use  . Smoking status: Never Smoker  . Smokeless tobacco: Never Used  Vaping Use  . Vaping Use: Never used  Substance and Sexual Activity  . Alcohol use: Not Currently  . Drug use: Never  . Sexual activity: Not Currently  Other Topics Concern  . Not on file  Social History Narrative   Moved from [place]. 2020, lives near daughter. Retired. Enjoys walking.    Social Determinants of Health   Financial Resource Strain: Low Risk   . Difficulty of Paying Living Expenses: Not hard at all  Food Insecurity: No Food Insecurity  . Worried About Charity fundraiser in the Last Year: Never true  . Ran Out  of Food in the Last Year: Never true  Transportation Needs: No Transportation Needs  . Lack of Transportation (Medical): No  . Lack of Transportation (Non-Medical): No  Physical Activity: Inactive  . Days of Exercise per Week: 0 days  . Minutes of Exercise per Session: 0 min  Stress: No Stress Concern Present  . Feeling of Stress : Not at all  Social Connections: Not on file  Intimate Partner Violence: Not At Risk  . Fear of Current or Ex-Partner: No  . Emotionally Abused: No  . Physically Abused: No  . Sexually Abused: No    Vital Signs: Blood pressure (!) 146/74, pulse 83, temperature 97.9 F (36.6 C), resp. rate 16, height 4' 11"  (1.499 m), weight 161 lb (73 kg), SpO2 99 %.  Examination: General Appearance: The patient is well-developed, well-nourished, and in no distress. Skin: Gross inspection of skin unremarkable. Head: normocephalic, no gross deformities. Eyes: no gross deformities noted. ENT: ears appear grossly normal no exudates. Neck: Supple. No thyromegaly. No LAD. Respiratory: no rhonchi noted. Cardiovascular: Normal S1 and S2 without murmur or rub. Extremities: No cyanosis. pulses are equal. Neurologic: Alert and oriented. No involuntary movements.  LABS: Recent Results (from the past 2160 hour(s))  CBC with Differential     Status: Abnormal   Collection Time: 02/13/20 11:55 AM  Result Value Ref Range   WBC 9.5 4.0 - 10.5 K/uL   RBC 4.57 3.87 - 5.11 Mil/uL   Hemoglobin 11.2 (L) 12.0 - 15.0 g/dL   HCT 34.0 (L) 36.0 - 46.0 %   MCV 74.3 (L) 78.0 - 100.0 fl   MCHC 33.0 30.0 - 36.0 g/dL   RDW 15.6 (H) 11.5 - 15.5 %   Platelets 537.0 (H) 150.0 - 400.0 K/uL   Neutrophils Relative % 72.5 43.0 - 77.0 %   Lymphocytes Relative 15.9 12.0 - 46.0 %   Monocytes Relative 8.1 3.0 - 12.0 %   Eosinophils Relative 2.9 0.0 - 5.0 %   Basophils Relative 0.6 0.0 - 3.0 %   Neutro Abs 6.9 1.4 - 7.7 K/uL   Lymphs Abs 1.5 0.7 - 4.0 K/uL   Monocytes Absolute 0.8 0.1 - 1.0 K/uL  Eosinophils Absolute 0.3 0.0 - 0.7 K/uL   Basophils Absolute 0.1 0.0 - 0.1 K/uL  Ferritin     Status: None   Collection Time: 02/13/20 11:55 AM  Result Value Ref Range   Ferritin 66.9 10.0 - 291.0 ng/mL  Comprehensive metabolic panel     Status: Abnormal   Collection Time: 02/13/20 11:55 AM  Result Value Ref Range   Sodium 136 135 - 145 mEq/L   Potassium 3.7 3.5 - 5.1 mEq/L   Chloride 99 96 - 112 mEq/L   CO2 30 19 - 32 mEq/L   Glucose, Bld 109 (H) 70 - 99 mg/dL   BUN 22 6 - 23 mg/dL   Creatinine, Ser 0.72 0.40 - 1.20 mg/dL   Total Bilirubin 0.2 0.2 - 1.2 mg/dL   Alkaline Phosphatase 120 (H) 39 - 117 U/L   AST 9 0 - 37 U/L   ALT 9 0 - 35 U/L   Total Protein 7.4 6.0 - 8.3 g/dL   Albumin 3.9 3.5 - 5.2 g/dL   GFR 80.10 >60.00 mL/min    Comment: Calculated using the CKD-EPI Creatinine Equation (2021)   Calcium 9.7 8.4 - 10.5 mg/dL  Hemoglobin A1c     Status: None   Collection Time: 03/12/20 10:47 AM  Result Value Ref Range   Hgb A1c MFr Bld 6.4 4.6 - 6.5 %    Comment: Glycemic Control Guidelines for People with Diabetes:Non Diabetic:  <6%Goal of Therapy: <7%Additional Action Suggested:  >8%   Lipid panel     Status: Abnormal   Collection Time: 03/12/20 10:47 AM  Result Value Ref Range   Cholesterol 143 0 - 200 mg/dL    Comment: ATP III Classification       Desirable:  < 200 mg/dL               Borderline High:  200 - 239 mg/dL          High:  > = 240 mg/dL   Triglycerides 174.0 (H) 0.0 - 149.0 mg/dL    Comment: Normal:  <150 mg/dLBorderline High:  150 - 199 mg/dL   HDL 41.90 >39.00 mg/dL   VLDL 34.8 0.0 - 40.0 mg/dL   LDL Cholesterol 67 0 - 99 mg/dL   Total CHOL/HDL Ratio 3     Comment:                Men          Women1/2 Average Risk     3.4          3.3Average Risk          5.0          4.42X Average Risk          9.6          7.13X Average Risk          15.0          11.0                       NonHDL 101.45     Comment: NOTE:  Non-HDL goal should be 30 mg/dL higher than  patient's LDL goal (i.e. LDL goal of < 70 mg/dL, would have non-HDL goal of < 100 mg/dL)    Radiology: US Venous Img Lower Unilateral Right (DVT)  Result Date: 02/26/2020 CLINICAL DATA:  Right calf pain. EXAM: RIGHT LOWER EXTREMITY VENOUS DOPPLER ULTRASOUND TECHNIQUE: Gray-scale sonography with graded compression, as well as color  Doppler and duplex ultrasound were performed to evaluate the lower extremity deep venous systems from the level of the common femoral vein and including the common femoral, femoral, profunda femoral, popliteal and calf veins including the posterior tibial, peroneal and gastrocnemius veins when visible. The superficial great saphenous vein was also interrogated. Spectral Doppler was utilized to evaluate flow at rest and with distal augmentation maneuvers in the common femoral, femoral and popliteal veins. COMPARISON:  None. FINDINGS: Contralateral Common Femoral Vein: Respiratory phasicity is normal and symmetric with the symptomatic side. No evidence of thrombus. Normal compressibility. Common Femoral Vein: No evidence of thrombus. Normal compressibility, respiratory phasicity and response to augmentation. Saphenofemoral Junction: No evidence of thrombus. Normal compressibility and flow on color Doppler imaging. Profunda Femoral Vein: No evidence of thrombus. Normal compressibility and flow on color Doppler imaging. Femoral Vein: No evidence of thrombus. Normal compressibility, respiratory phasicity and response to augmentation. Popliteal Vein: No evidence of thrombus. Normal compressibility, respiratory phasicity and response to augmentation. Calf Veins: No evidence of thrombus. Normal compressibility and flow on color Doppler imaging. Superficial Great Saphenous Vein: No evidence of thrombus. Normal compressibility. Venous Reflux:  None. Other Findings: No evidence of superficial thrombophlebitis or abnormal fluid collection. IMPRESSION: No evidence of right lower extremity deep  venous thrombosis. Electronically Signed   By: Aletta Edouard M.D.   On: 02/26/2020 17:24    No results found.  No results found.    Assessment and Plan: Patient Active Problem List   Diagnosis Date Noted  . Migraine 03/12/2020  . IBS (irritable bowel syndrome) 03/12/2020  . S/P TKR (total knee replacement) using cement, right 01/01/2020  . History of vaginal hysterectomy 06/11/2019  . Prediabetes 04/22/2019  . Estrogen deficiency 04/22/2019  . Urinary frequency 04/22/2019  . History of skin cancer 04/22/2019  . High-tone pelvic floor dysfunction 04/03/2019  . IC (interstitial cystitis) 04/03/2019  . Neuralgia of both pudendal nerves 04/03/2019  . Pelvic pain in female 04/03/2019  . Osteoporosis 02/12/2019  . Asthma 01/31/2019  . Adverse reaction to influenza vaccine, initial encounter 12/03/2018  . Vitamin D deficiency 12/03/2018  . Restless legs syndrome 11/28/2018  . Small fiber neuropathy 11/28/2018  . Allergic rhinitis 10/08/2018  . Benign essential hypertension 10/08/2018  . Chronic bladder pain 10/08/2018  . Neuropathy 10/08/2018  . Osteoarthritis 10/08/2018  . S/P BSO (bilateral salpingo-oophorectomy) 01/07/2018  . OSA on CPAP 12/29/2016  . Shortness of breath 06/24/2012  . Other and unspecified disc disorder of lumbar region 10/25/2000  . High cholesterol 09/24/1998  . Tinnitus of both ears 09/24/1983     1. OSA (obstructive sleep apnea) We will get follow-up for her to get new machine set up and so therefore I will refer her to the DME of her choice. - For home use only DME continuous positive airway pressure (CPAP)  2. Obesity, morbid ([place]) Obesity Counseling: Had a lengthy discussion regarding patients BMI and weight issues. Patient was instructed on portion control as well as increased activity. Also discussed caloric restrictions with trying to maintain intake less than 2000 Kcal. Discussions were made in accordance with the 5As of weight management.  Simple actions such as not eating late and if able to, taking a walk is suggested.   3. CPAP use counseling CPAP Counseling: had a lengthy discussion with the patient regarding the importance of PAP therapy in management of the sleep apnea. Patient appears to understand the risk factor reduction and also understands the risks associated with untreated sleep apnea. Patient  will try to make a good faith effort to remain compliant with therapy. Also instructed the patient on proper cleaning of the device including the water must be changed daily if possible and use of distilled water is preferred. Patient understands that the machine should be regularly cleaned with appropriate recommended cleaning solutions that do not damage the PAP machine for example given white vinegar and water rinses. Other methods such as ozone treatment may not be as good as these simple methods to achieve cleaning.   4. Exercise-induced asthma Continue inhalers and encourage compliance.  The patient will definitely benefit from inhaler regimen as ordered. - Pulmonary function test; Future    General Counseling: I have discussed the findings of the evaluation and examination with Erin Petersen.  I have also discussed any further diagnostic evaluation thatmay be needed or ordered today. Erin Petersen verbalizes understanding of the findings of todays visit. We also reviewed her medications today and discussed drug interactions and side effects including but not limited excessive drowsiness and altered mental states. We also discussed that there is always a risk not just to her but also people around her. she has been encouraged to call the office with any questions or concerns that should arise related to todays visit.  No orders of the defined types were placed in this encounter.    Time spent: 46  I have personally obtained a history, examined the patient, evaluated laboratory and imaging results, formulated the assessment and plan  and placed orders.    Allyne Gee, MD [facility] Pulmonary and Critical Care Sleep medicine

## 2020-04-12 ENCOUNTER — Encounter: Payer: Self-pay | Admitting: Internal Medicine

## 2020-04-13 NOTE — Telephone Encounter (Signed)
Please see

## 2020-04-14 DIAGNOSIS — H903 Sensorineural hearing loss, bilateral: Secondary | ICD-10-CM | POA: Diagnosis not present

## 2020-04-14 DIAGNOSIS — H6123 Impacted cerumen, bilateral: Secondary | ICD-10-CM | POA: Diagnosis not present

## 2020-04-20 DIAGNOSIS — H903 Sensorineural hearing loss, bilateral: Secondary | ICD-10-CM | POA: Diagnosis not present

## 2020-04-27 ENCOUNTER — Encounter: Payer: Self-pay | Admitting: Family Medicine

## 2020-04-28 ENCOUNTER — Ambulatory Visit (INDEPENDENT_AMBULATORY_CARE_PROVIDER_SITE_OTHER): Payer: Medicare Other | Admitting: Internal Medicine

## 2020-04-28 DIAGNOSIS — J4520 Mild intermittent asthma, uncomplicated: Secondary | ICD-10-CM | POA: Diagnosis not present

## 2020-04-28 DIAGNOSIS — J45990 Exercise induced bronchospasm: Secondary | ICD-10-CM

## 2020-04-28 LAB — PULMONARY FUNCTION TEST

## 2020-05-03 ENCOUNTER — Other Ambulatory Visit: Payer: Self-pay | Admitting: Cardiology

## 2020-05-03 DIAGNOSIS — I10 Essential (primary) hypertension: Secondary | ICD-10-CM | POA: Diagnosis not present

## 2020-05-03 DIAGNOSIS — E782 Mixed hyperlipidemia: Secondary | ICD-10-CM | POA: Diagnosis not present

## 2020-05-03 DIAGNOSIS — G459 Transient cerebral ischemic attack, unspecified: Secondary | ICD-10-CM | POA: Diagnosis not present

## 2020-05-03 DIAGNOSIS — G4733 Obstructive sleep apnea (adult) (pediatric): Secondary | ICD-10-CM | POA: Diagnosis not present

## 2020-05-04 ENCOUNTER — Ambulatory Visit
Admission: RE | Admit: 2020-05-04 | Discharge: 2020-05-04 | Disposition: A | Discharge status: Home / Self Care | Payer: Medicare Other | Source: Ambulatory Visit | Attending: Cardiology | Admitting: Cardiology

## 2020-05-04 ENCOUNTER — Other Ambulatory Visit: Payer: Self-pay

## 2020-05-04 DIAGNOSIS — G459 Transient cerebral ischemic attack, unspecified: Secondary | ICD-10-CM | POA: Insufficient documentation

## 2020-05-04 DIAGNOSIS — I6389 Other cerebral infarction: Secondary | ICD-10-CM | POA: Diagnosis not present

## 2020-05-04 DIAGNOSIS — H532 Diplopia: Secondary | ICD-10-CM | POA: Diagnosis not present

## 2020-05-05 DIAGNOSIS — G4733 Obstructive sleep apnea (adult) (pediatric): Secondary | ICD-10-CM | POA: Diagnosis not present

## 2020-05-06 DIAGNOSIS — R061 Stridor: Secondary | ICD-10-CM | POA: Diagnosis not present

## 2020-05-06 DIAGNOSIS — J386 Stenosis of larynx: Secondary | ICD-10-CM | POA: Diagnosis not present

## 2020-05-06 DIAGNOSIS — J383 Other diseases of vocal cords: Secondary | ICD-10-CM | POA: Diagnosis not present

## 2020-05-09 NOTE — Procedures (Signed)
[facility] [address] [place], [address]    Complete Pulmonary Function Testing Interpretation:  FINDINGS:  The forced vital capacity is normal.  FEV1 is within normal limits.  FEV1 FVC ratio was mildly decreased.  Postbronchodilator no significant change in the FEV1 clinical improvement may still occur in the absence of spirometric improvement.  FEV1 FVC ratio was mildly decreased.  Total lung capacity is normal residual volume is increased residual out of past ratio is increased.  FRC is normal.  DLCO was within normal limits.  The flow volume loop did show somewhat flattening of the expiratory limb clinical correlation is recommended.  IMPRESSION:  This pulmonary function study is borderline normal to mild obstructive lung disease.  There was a mild flattening of the expiratory limb of the flow volume loop clinical correlation is recommended  Allyne Gee, MD [facility] Pulmonary Critical Care Medicine Sleep Medicine

## 2020-05-26 ENCOUNTER — Telehealth: Payer: Self-pay

## 2020-05-26 MED ORDER — ONDANSETRON HCL 4 MG PO TABS: 4.0000 mg | ORAL_TABLET | Freq: Three times a day (TID) | ORAL | 0 refills | Status: DC | PRN

## 2020-05-26 NOTE — Addendum Note (Signed)
Addended by: Loura Pardon A on: 05/26/2020 07:15 PM   Modules accepted: Orders

## 2020-05-26 NOTE — Telephone Encounter (Signed)
Spoke to patient and was advised that Tor Netters NP had prescribe it for her some time ago. Patient stated that she has acid reflux real bad at times which causes her to have nausea. Patient stated that she does not have to take the Ondansetron very often unless her acid reflux gets bad. Patient stated that she likes to keep some on hand when she needs it.

## 2020-05-26 NOTE — Telephone Encounter (Signed)
  LAST APPOINTMENT DATE: 03/12/2020   NEXT APPOINTMENT DATE:@Visit  date not found  MEDICATION:ONDANSETRON HCL 4MG   [id number]  Please advise

## 2020-05-26 NOTE — Telephone Encounter (Signed)
Since it was off her medicine list please ask what she takes it for?  If nausea, what from?  Thanks

## 2020-05-26 NOTE — Telephone Encounter (Signed)
See prev note on refill request, med not on med list but last time ondansetron 4 mg was prescribed was by Tor Netters, NP on 06/12/19 #30 tabs with 0 refills, TOC appt was on 03/12/20 with Dr. Glori Bickers, please advise   [place]

## 2020-05-26 NOTE — Telephone Encounter (Signed)
I sent it  Please f/u if this does not improve

## 2020-05-27 NOTE — Telephone Encounter (Signed)
Pt.notified

## 2020-06-07 ENCOUNTER — Encounter: Payer: Self-pay | Admitting: Gastroenterology

## 2020-06-07 DIAGNOSIS — G8929 Other chronic pain: Secondary | ICD-10-CM

## 2020-06-07 MED ORDER — DICYCLOMINE HCL 10 MG PO CAPS: 10.0000 mg | ORAL_CAPSULE | Freq: Every day | ORAL | 0 refills | Status: DC

## 2020-06-07 NOTE — Telephone Encounter (Signed)
Last office visit 10/21/2019 Duodenal Ulcer abdominal bloating  Last refill 12/01/2019 1 refills

## 2020-06-08 DIAGNOSIS — Z90710 Acquired absence of both cervix and uterus: Secondary | ICD-10-CM | POA: Diagnosis not present

## 2020-06-08 DIAGNOSIS — N3010 Interstitial cystitis (chronic) without hematuria: Secondary | ICD-10-CM | POA: Diagnosis not present

## 2020-06-08 DIAGNOSIS — Z90722 Acquired absence of ovaries, bilateral: Secondary | ICD-10-CM | POA: Diagnosis not present

## 2020-06-08 DIAGNOSIS — R102 Pelvic and perineal pain: Secondary | ICD-10-CM | POA: Diagnosis not present

## 2020-06-28 DIAGNOSIS — G629 Polyneuropathy, unspecified: Secondary | ICD-10-CM | POA: Diagnosis not present

## 2020-06-28 DIAGNOSIS — R4189 Other symptoms and signs involving cognitive functions and awareness: Secondary | ICD-10-CM | POA: Diagnosis not present

## 2020-06-28 DIAGNOSIS — G2581 Restless legs syndrome: Secondary | ICD-10-CM | POA: Diagnosis not present

## 2020-07-01 ENCOUNTER — Encounter: Payer: Self-pay | Admitting: Family Medicine

## 2020-07-01 ENCOUNTER — Other Ambulatory Visit: Payer: Self-pay | Admitting: Family Medicine

## 2020-07-01 NOTE — Telephone Encounter (Signed)
Est care appt was done on 03/12/20, last filled on 03/12/20 #180 tabs 0 refills

## 2020-07-07 ENCOUNTER — Encounter: Payer: Self-pay | Admitting: Gastroenterology

## 2020-07-07 DIAGNOSIS — M25562 Pain in left knee: Secondary | ICD-10-CM | POA: Diagnosis not present

## 2020-07-07 DIAGNOSIS — M1712 Unilateral primary osteoarthritis, left knee: Secondary | ICD-10-CM | POA: Diagnosis not present

## 2020-07-07 MED ORDER — ONDANSETRON HCL 4 MG PO TABS: 4.0000 mg | ORAL_TABLET | Freq: Three times a day (TID) | ORAL | 0 refills | Status: DC | PRN

## 2020-07-07 MED ORDER — DICYCLOMINE HCL 10 MG PO CAPS: 10.0000 mg | ORAL_CAPSULE | Freq: Three times a day (TID) | ORAL | 0 refills | Status: DC

## 2020-07-07 NOTE — Telephone Encounter (Signed)
Sent medication to the pharmacy  

## 2020-07-15 ENCOUNTER — Other Ambulatory Visit: Payer: Self-pay

## 2020-07-15 NOTE — Telephone Encounter (Signed)
Dicyclomine got approved by insurance

## 2020-07-15 NOTE — Telephone Encounter (Signed)
[facility] and [facility] said the Dicyclomine needed a PA on the medication to be refill. Sent PA through cover my meds

## 2020-08-03 ENCOUNTER — Other Ambulatory Visit: Payer: Self-pay

## 2020-08-03 ENCOUNTER — Ambulatory Visit: Payer: Medicare Other | Admitting: Podiatry

## 2020-08-03 DIAGNOSIS — L600 Ingrowing nail: Secondary | ICD-10-CM

## 2020-08-04 ENCOUNTER — Encounter: Payer: Self-pay | Admitting: Podiatry

## 2020-08-04 NOTE — Progress Notes (Signed)
Subjective:  Patient ID: Erin Petersen, female    DOB: 1940-06-04,  MRN: [id number]  Chief Complaint  Patient presents with  . Nail Problem    Left hallux nail possible ingrown     80 y.o. female presents with the above complaint.  Patient presents with complaint left hallux medial lateral border ingrown.  Patient states is painful to touch.  She would like to have removed.  She had it done on the right side long time ago which seems to be doing well.  She has not seen anyone else prior to seeing me.  She denies any other acute complaints.   Review of Systems: Negative except as noted in the HPI. Denies N/V/F/Ch.  Past Medical History:  Diagnosis Date  . Arthritis   . Asthma    exercise induced asthma  . Bacteremia due to Gram-negative bacteria 06/04/2019  . Colon polyps   . Duodenal ulcer   . Dyspnea   . GERD (gastroesophageal reflux disease)   . Hyperlipidemia   . Hypertension   . Neuropathy    right leg and foot  . Plantar fasciitis   . Pre-diabetes   . Prediabetes   . RLQ abdominal pain   . RLS (restless legs syndrome)   . Sleep apnea    CPAP  . SOB (shortness of breath)   . Urinary incontinence   . Urinary tract infection   . Wears hearing aid in right ear     Current Outpatient Medications:  .  acetaminophen (TYLENOL) 650 MG CR tablet, Take 1,300 mg by mouth every 8 (eight) hours as needed for pain. , Disp: , Rfl:  .  albuterol (VENTOLIN HFA) 108 (90 Base) MCG/ACT inhaler, Inhale 2 puffs into the lungs every 6 (six) hours as needed for wheezing or shortness of breath., Disp: , Rfl:  .  alendronate (FOSAMAX) 70 MG tablet, Take 70 mg by mouth once a week. Take with a full glass of water on an empty stomach., Disp: , Rfl:  .  amitriptyline (ELAVIL) 25 MG tablet, Take 1 tablet (25 mg total) by mouth at bedtime., Disp: 90 tablet, Rfl: 3 .  amLODipine (NORVASC) 5 MG tablet, Take 1 tablet (5 mg total) by mouth daily., Disp: 90 tablet, Rfl: 3 .  aspirin 81 MG EC tablet,  Take 81 mg by mouth at bedtime. , Disp: , Rfl:  .  cephALEXin (KEFLEX) 500 MG capsule, Take 4 pills one hour prior to dental appointment., Disp: , Rfl:  .  Cholecalciferol (VITAMIN D3) 50 MCG (2000 UT) TABS, Take 2,000 Units by mouth daily. , Disp: , Rfl:  .  Cyanocobalamin (VITAMIN B-12) 3000 MCG SUBL, Take 3,000 mcg by mouth daily., Disp: , Rfl:  .  dicyclomine (BENTYL) 10 MG capsule, Take 1 capsule (10 mg total) by mouth at bedtime., Disp: 90 capsule, Rfl: 0 .  dicyclomine (BENTYL) 10 MG capsule, Take 1 capsule (10 mg total) by mouth 4 (four) times daily -  before meals and at bedtime., Disp: 90 capsule, Rfl: 0 .  docusate sodium (COLACE) 100 MG capsule, Take 1 capsule (100 mg total) by mouth 2 (two) times daily., Disp: 10 capsule, Rfl: 0 .  EPINEPHrine 0.3 mg/0.3 mL IJ SOAJ injection, Inject 0.3 mg into the muscle as needed for anaphylaxis., Disp: 1 each, Rfl: 1 .  estradiol (ESTRACE) 0.1 MG/GM vaginal cream, Place 1 Applicatorful vaginally at bedtime., Disp: , Rfl:  .  fluticasone (FLONASE) 50 MCG/ACT nasal spray, Place 2 sprays into both  nostrils daily., Disp: 16 g, Rfl: 3 .  hydrochlorothiazide (HYDRODIURIL) 25 MG tablet, Take 1 tablet (25 mg total) by mouth daily., Disp: 90 tablet, Rfl: 3 .  hydrOXYzine (ATARAX/VISTARIL) 25 MG tablet, Take 1 tablet (25 mg total) by mouth at bedtime., Disp: 90 tablet, Rfl: 3 .  linaclotide (LINZESS) 145 MCG CAPS capsule, TAKE 1 CAPSULE BY MOUTH  DAILY BEFORE BREAKFAST, Disp: 90 capsule, Rfl: 1 .  Melatonin 10 MG TABS, Take 10 mg by mouth at bedtime., Disp: , Rfl:  .  meloxicam (MOBIC) 15 MG tablet, Take 1 tablet (15 mg total) by mouth daily. Take with food, Disp: 90 tablet, Rfl: 3 .  omeprazole (PRILOSEC) 40 MG capsule, Take 1 capsule (40 mg total) by mouth daily before breakfast., Disp: 180 capsule, Rfl: 1 .  ondansetron (ZOFRAN) 4 MG tablet, Take 1 tablet (4 mg total) by mouth every 8 (eight) hours as needed for nausea or vomiting., Disp: 20 tablet, Rfl:  0 .  ondansetron (ZOFRAN) 4 MG tablet, Take 1 tablet (4 mg total) by mouth every 8 (eight) hours as needed for nausea or vomiting., Disp: 30 tablet, Rfl: 0 .  polyethylene glycol (MIRALAX / GLYCOLAX) 17 g packet, Take 1 packet by mouth daily. , Disp: , Rfl:  .  pregabalin (LYRICA) 150 MG capsule, Take 1 capsule (150 mg total) by mouth 2 (two) times daily., Disp: 20 capsule, Rfl: 0 .  rOPINIRole (REQUIP) 1 MG tablet, TAKE 1 TABLET IN THE MORNING AND 1 TABLET IN THE AFTERNOON, Disp: 180 tablet, Rfl: 2 .  rOPINIRole (REQUIP) 4 MG tablet, Take 1 tablet (4 mg total) by mouth at bedtime., Disp: 90 tablet, Rfl: 3 .  simvastatin (ZOCOR) 20 MG tablet, Take 1 tablet (20 mg total) by mouth at bedtime., Disp: 90 tablet, Rfl: 3 .  traMADol (ULTRAM) 50 MG tablet, Take 1 tablet (50 mg total) by mouth every 6 (six) hours as needed., Disp: 30 tablet, Rfl: 0  Social History   Tobacco Use  Smoking Status Never Smoker  Smokeless Tobacco Never Used    Allergies  Allergen Reactions  . Other Anaphylaxis    Bee Sting   . Fosamax [Alendronate]     Nausea   . Ciprofloxacin Hcl Rash    Mild rash after long term use  . Sulfa Antibiotics Rash    Bactrim   Objective:  There were no vitals filed for this visit. There is no height or weight on file to calculate BMI. Constitutional Well developed. Well nourished.  Vascular Dorsalis pedis pulses palpable bilaterally. Posterior tibial pulses palpable bilaterally. Capillary refill normal to all digits.  No cyanosis or clubbing noted. Pedal hair growth normal.  Neurologic Normal speech. Oriented to person, place, and time. Epicritic sensation to light touch grossly present bilaterally.  Dermatologic Painful ingrowing nail at Medial lateral border nail borders of the hallux nail left. No other open wounds. No skin lesions.  Orthopedic: Normal joint ROM without pain or crepitus bilaterally. No visible deformities. No bony tenderness.   Radiographs:  None Assessment:   1. Ingrown left big toenail    Plan:  Patient was evaluated and treated and all questions answered.  Ingrown Nail, left -Patient elects to proceed with minor surgery to remove ingrown toenail removal today. Consent reviewed and signed by patient. -Ingrown nail excised. See procedure note. -Educated on post-procedure care including soaking. Written instructions provided and reviewed. -Patient to follow up in 2 weeks for nail check.  Procedure: Excision of Ingrown Toenail Location:  Left 1st toe medial and lateral nail borders. Anesthesia: Lidocaine 1% plain; 1.5 mL and Marcaine 0.5% plain; 1.5 mL, digital block. Skin Prep: Betadine. Dressing: Silvadene; telfa; dry, sterile, compression dressing. Technique: Following skin prep, the toe was exsanguinated and a tourniquet was secured at the base of the toe. The affected nail border was freed, split with a nail splitter, and excised. Chemical matrixectomy was then performed with phenol and irrigated out with alcohol. The tourniquet was then removed and sterile dressing applied. Disposition: Patient tolerated procedure well. Patient to return in 2 weeks for follow-up.   No follow-ups on file.

## 2020-08-07 ENCOUNTER — Encounter: Payer: Self-pay | Admitting: Family Medicine

## 2020-08-09 ENCOUNTER — Telehealth: Payer: Self-pay | Admitting: Family Medicine

## 2020-08-09 ENCOUNTER — Other Ambulatory Visit: Payer: Self-pay

## 2020-08-09 ENCOUNTER — Ambulatory Visit: Payer: Medicare Other | Admitting: Internal Medicine

## 2020-08-09 ENCOUNTER — Encounter: Payer: Self-pay | Admitting: Internal Medicine

## 2020-08-09 VITALS — BP 126/73 | HR 85 | Temp 98.2°F | Resp 16 | Ht 59.0 in | Wt 168.0 lb

## 2020-08-09 DIAGNOSIS — R0602 Shortness of breath: Secondary | ICD-10-CM

## 2020-08-09 DIAGNOSIS — E559 Vitamin D deficiency, unspecified: Secondary | ICD-10-CM

## 2020-08-09 DIAGNOSIS — G4733 Obstructive sleep apnea (adult) (pediatric): Secondary | ICD-10-CM | POA: Diagnosis not present

## 2020-08-09 DIAGNOSIS — M810 Age-related osteoporosis without current pathological fracture: Secondary | ICD-10-CM

## 2020-08-09 DIAGNOSIS — I10 Essential (primary) hypertension: Secondary | ICD-10-CM

## 2020-08-09 DIAGNOSIS — J4520 Mild intermittent asthma, uncomplicated: Secondary | ICD-10-CM

## 2020-08-09 DIAGNOSIS — E7800 Pure hypercholesterolemia, unspecified: Secondary | ICD-10-CM

## 2020-08-09 DIAGNOSIS — Z7189 Other specified counseling: Secondary | ICD-10-CM

## 2020-08-09 DIAGNOSIS — R7303 Prediabetes: Secondary | ICD-10-CM

## 2020-08-09 NOTE — Telephone Encounter (Signed)
-----   Message from Cloyd Stagers, RT sent at 07/27/2020 12:24 PM EDT ----- Regarding: Lab Orders for Tuesday 6.14.2022 Please place lab orders for Tuesday 6.14.2022, office visit for physical on Tuesday 6.21.2022 Thank you, Dyke Maes RT(R)

## 2020-08-09 NOTE — Patient Instructions (Signed)
Sleep Apnea Sleep apnea affects breathing during sleep. It causes breathing to stop for 10 seconds or more, or to become shallow. People with sleep apnea usually snoreloudly. It can also increase the risk of: Heart attack. Stroke. Being very overweight (obese). Diabetes. Heart failure. Irregular heartbeat. High blood pressure. The goal of treatment is to help you breathe normally again. What are the causes?  The most common cause of this condition is a collapsed or blocked airway. There are three kinds of sleep apnea: Obstructive sleep apnea. This is caused by a blocked or collapsed airway. Central sleep apnea. This happens when the brain does not send the right signals to the muscles that control breathing. Mixed sleep apnea. This is a combination of obstructive and central sleep apnea. What increases the risk? Being overweight. Smoking. Having a small airway. Being older. Being female. Drinking alcohol. Taking medicines to calm yourself (sedatives or tranquilizers). Having family members with the condition. Having a tongue or tonsils that are larger than normal. What are the signs or symptoms? Trouble staying asleep. Loud snoring. Headaches in the morning. Waking up gasping. Dry mouth or sore throat in the morning. Being sleepy or tired during the day. If you are sleepy or tired during the day, you may also: Not be able to focus your mind (concentrate). Forget things. Get angry a lot and have mood swings. Feel sad (depressed). Have changes in your personality. Have less interest in sex, if you are female. Be unable to have an erection, if you are female. How is this treated?  Sleeping on your side. Using a medicine to get rid of mucus in your nose (decongestant). Avoiding the use of alcohol, medicines to help you relax, or certain pain medicines (narcotics). Losing weight, if needed. Changing your diet. Quitting smoking. Using a machine to open your airway while you  sleep, such as: An oral appliance. This is a mouthpiece that shifts your lower jaw forward. A CPAP device. This device blows air through a mask when you breathe out (exhale). An EPAP device. This has valves that you put in each nostril. A BPAP device. This device blows air through a mask when you breathe in (inhale) and breathe out. Having surgery if other treatments do not work. Follow these instructions at home: Lifestyle Make changes that your doctor recommends. Eat a healthy diet. Lose weight if needed. Avoid alcohol, medicines to help you relax, and some pain medicines. Do not smoke or use any products that contain nicotine or tobacco. If you need help quitting, ask your doctor. General instructions Take over-the-counter and prescription medicines only as told by your doctor. If you were given a machine to use while you sleep, use it only as told by your doctor. If you are having surgery, make sure to tell your doctor you have sleep apnea. You may need to bring your device with you. Keep all follow-up visits. Contact a doctor if: The machine that you were given to use during sleep bothers you or does not seem to be working. You do not get better. You get worse. Get help right away if: Your chest hurts. You have trouble breathing in enough air. You have an uncomfortable feeling in your back, arms, or stomach. You have trouble talking. One side of your body feels weak. A part of your face is hanging down. These symptoms may be an emergency. Get help right away. Call your local emergency services (911 in the [place] Do not wait to see if the symptoms   will go away. Do not drive yourself to the hospital. Summary This condition affects breathing during sleep. The most common cause is a collapsed or blocked airway. The goal of treatment is to help you breathe normally while you sleep. This information is not intended to replace advice given to you by your health care provider. Make  sure you discuss any questions you have with your healthcare provider. Document Revised: 01/23/2020 Document Reviewed: 01/23/2020 [employer] Patient Education  2022 [employer]  

## 2020-08-09 NOTE — Progress Notes (Signed)
[facility] [place] [facility] [place], [place] [address]  Pulmonary Sleep Medicine   Office Visit Note  Patient Name: Erin Petersen DOB: 1940/11/13 MRN [id number]  Date of Service: 08/09/2020  Complaints/HPI: Erin Petersen is doing Ok overall. Erin Petersen notes some upper airway wheeze noted on occasion when Erin Petersen wakes up in the morning. Erin Petersen does note only having it in the mornings. It does seem to go away after an hour. PFT done looked good with no major issues noted. Erin Petersen has not had any admissions since her knee replacement  ROS  General: (-) fever, (-) chills, (-) night sweats, (-) weakness Skin: (-) rashes, (-) itching,. Eyes: (-) visual changes, (-) redness, (-) itching. Nose and Sinuses: (-) nasal stuffiness or itchiness, (-) postnasal drip, (-) nosebleeds, (-) sinus trouble. Mouth and Throat: (-) sore throat, (-) hoarseness. Neck: (-) swollen glands, (-) enlarged thyroid, (-) neck pain. Respiratory: - cough, (-) bloody sputum, + shortness of breath, - wheezing. Cardiovascular: - ankle swelling, (-) chest pain. Lymphatic: (-) lymph node enlargement. Neurologic: (-) numbness, (-) tingling. Psychiatric: (-) anxiety, (-) depression   Current Medication: Outpatient Encounter Medications as of 08/09/2020  Medication Sig   acetaminophen (TYLENOL) 650 MG CR tablet Take 1,300 mg by mouth every 8 (eight) hours as needed for pain.    albuterol (VENTOLIN HFA) 108 (90 Base) MCG/ACT inhaler Inhale 2 puffs into the lungs every 6 (six) hours as needed for wheezing or shortness of breath.   alendronate (FOSAMAX) 70 MG tablet Take 70 mg by mouth once a week. Take with a full glass of water on an empty stomach.   amitriptyline (ELAVIL) 25 MG tablet Take 1 tablet (25 mg total) by mouth at bedtime.   amLODipine (NORVASC) 5 MG tablet Take 1 tablet (5 mg total) by mouth daily.   aspirin 81 MG EC tablet Take 81 mg by mouth at bedtime.    cephALEXin (KEFLEX) 500 MG capsule Take 4 pills one hour prior to  dental appointment.   Cholecalciferol (VITAMIN D3) 50 MCG (2000 UT) TABS Take 2,000 Units by mouth daily.    Cyanocobalamin (VITAMIN B-12) 3000 MCG SUBL Take 3,000 mcg by mouth daily.   docusate sodium (COLACE) 100 MG capsule Take 1 capsule (100 mg total) by mouth 2 (two) times daily.   EPINEPHrine 0.3 mg/0.3 mL IJ SOAJ injection Inject 0.3 mg into the muscle as needed for anaphylaxis.   estradiol (ESTRACE) 0.1 MG/GM vaginal cream Place 1 Applicatorful vaginally at bedtime.   fluticasone (FLONASE) 50 MCG/ACT nasal spray Place 2 sprays into both nostrils daily.   hydrochlorothiazide (HYDRODIURIL) 25 MG tablet Take 1 tablet (25 mg total) by mouth daily.   hydrOXYzine (ATARAX/VISTARIL) 25 MG tablet Take 1 tablet (25 mg total) by mouth at bedtime.   linaclotide (LINZESS) 145 MCG CAPS capsule TAKE 1 CAPSULE BY MOUTH  DAILY BEFORE BREAKFAST   Melatonin 10 MG TABS Take 10 mg by mouth at bedtime.   meloxicam (MOBIC) 15 MG tablet Take 1 tablet (15 mg total) by mouth daily. Take with food   ondansetron (ZOFRAN) 4 MG tablet Take 1 tablet (4 mg total) by mouth every 8 (eight) hours as needed for nausea or vomiting.   polyethylene glycol (MIRALAX / GLYCOLAX) 17 g packet Take 1 packet by mouth daily.    pregabalin (LYRICA) 150 MG capsule Take 1 capsule (150 mg total) by mouth 2 (two) times daily.   rOPINIRole (REQUIP) 1 MG tablet TAKE 1 TABLET IN THE MORNING AND 1 TABLET  IN THE AFTERNOON   rOPINIRole (REQUIP) 4 MG tablet Take 1 tablet (4 mg total) by mouth at bedtime.   simvastatin (ZOCOR) 20 MG tablet Take 1 tablet (20 mg total) by mouth at bedtime.   traMADol (ULTRAM) 50 MG tablet Take 1 tablet (50 mg total) by mouth every 6 (six) hours as needed.   omeprazole (PRILOSEC) 40 MG capsule Take 1 capsule (40 mg total) by mouth daily before breakfast.   [DISCONTINUED] dicyclomine (BENTYL) 10 MG capsule Take 1 capsule (10 mg total) by mouth at bedtime.   [DISCONTINUED] dicyclomine (BENTYL) 10 MG capsule Take 1  capsule (10 mg total) by mouth 4 (four) times daily -  before meals and at bedtime.   [DISCONTINUED] ondansetron (ZOFRAN) 4 MG tablet Take 1 tablet (4 mg total) by mouth every 8 (eight) hours as needed for nausea or vomiting.   No facility-administered encounter medications on file as of 08/09/2020.    Surgical History: Past Surgical History:  Procedure Laterality Date   [place]   BACK SURGERY     lumbar fusion   BILATERAL SALPINGOOPHORECTOMY  01/07/2018   BIOPSY  04/15/2019   Procedure: BIOPSY;  Surgeon: [name], MD;  Location: [facility];  Service: Endoscopy;;   CARDIAC CATHETERIZATION  2019   COLONOSCOPY WITH PROPOFOL N/A 11/11/2018   Procedure: COLONOSCOPY WITH PROPOFOL;  Surgeon: [name], MD;  Location: [facility] ENDOSCOPY;  Service: Gastroenterology;  Laterality: N/A;   CYSTO WITH HYDRODISTENSION N/A 12/30/2018   Procedure: CYSTOSCOPY/HYDRODISTENSION WITH MARCAINE;  Surgeon: Bjorn Loser, MD;  Location: [facility];  Service: Urology;  Laterality: N/A;   ESOPHAGOGASTRODUODENOSCOPY (EGD) WITH PROPOFOL N/A 04/15/2019   Procedure: ESOPHAGOGASTRODUODENOSCOPY (EGD) WITH PROPOFOL;  Surgeon: [name], MD;  Location: [facility];  Service: Endoscopy;  Laterality: N/A;  sleep apnea   ESOPHAGOGASTRODUODENOSCOPY (EGD) WITH PROPOFOL N/A 07/09/2019   Procedure: ESOPHAGOGASTRODUODENOSCOPY (EGD) WITH PROPOFOL;  Surgeon: [name], MD;  Location: [facility];  Service: Gastroenterology;  Laterality: N/A;   EYE SURGERY Bilateral    HERNIA REPAIR     abd   HERNIA REPAIR     umbilical   INGUINAL HERNIA REPAIR Bilateral    inguinal   LAPAROSCOPIC BILATERAL SALPINGO OOPHERECTOMY     SPINAL FUSION  2002   TONSILLECTOMY AND ADENOIDECTOMY  1961   TOTAL KNEE ARTHROPLASTY Right 01/01/2020   Procedure: Right total knee arthroplasty - [name] to Assist;  Surgeon: Hessie Knows, MD;  Location: [facility];   Service: Orthopedics;  Laterality: Right;   TUBAL LIGATION      Medical History: Past Medical History:  Diagnosis Date   Arthritis    Asthma    exercise induced asthma   Bacteremia due to Gram-negative bacteria 06/04/2019   Colon polyps    Duodenal ulcer    Dyspnea    GERD (gastroesophageal reflux disease)    Hyperlipidemia    Hypertension    Neuropathy    right leg and foot   Plantar fasciitis    Pre-diabetes    Prediabetes    RLQ abdominal pain    RLS (restless legs syndrome)    Sleep apnea    CPAP   SOB (shortness of breath)    Urinary incontinence    Urinary tract infection    Wears hearing aid in right ear     Family History: Family History  Problem Relation Age of Onset   Alcohol abuse Mother    Cancer  Mother    Stroke Mother    Alcohol abuse Father    Arthritis Sister    Arthritis Daughter    COPD Daughter    Arthritis Paternal Grandmother    Asthma Paternal Grandmother    COPD Paternal Grandmother    Breast cancer Paternal Aunt     Social History: Social History   Socioeconomic History   Marital status: Widowed    Spouse name: Not on file   Number of children: 3   Years of education: Not on file   Highest education level: Bachelor's degree (e.g., BA, AB, BS)  Occupational History   Not on file  Tobacco Use   Smoking status: Never   Smokeless tobacco: Never  Vaping Use   Vaping Use: Never used  Substance and Sexual Activity   Alcohol use: Not Currently   Drug use: Never   Sexual activity: Not Currently  Other Topics Concern   Not on file  Social History Narrative   Moved from [place] 2020, lives near daughter. Retired. Enjoys walking.    Social Determinants of Health   Financial Resource Strain: Not on file  Food Insecurity: Not on file  Transportation Needs: Not on file  Physical Activity: Not on file  Stress: Not on file  Social Connections: Not on file  Intimate Partner Violence: Not on file    Vital Signs: Blood pressure  126/73, pulse 85, temperature 98.2 F (36.8 C), resp. rate 16, height 4' 11"  (1.499 m), weight 168 lb (76.2 kg), SpO2 97 %.  Examination: General Appearance: The patient is well-developed, well-nourished, and in no distress. Skin: Gross inspection of skin unremarkable. Head: normocephalic, no gross deformities. Eyes: no gross deformities noted. ENT: ears appear grossly normal no exudates. Neck: Supple. No thyromegaly. No LAD. Respiratory: no rhonchi noted. Cardiovascular: Normal S1 and S2 without murmur or rub. Extremities: No cyanosis. pulses are equal. Neurologic: Alert and oriented. No involuntary movements.  LABS: No results found for this or any previous visit (from the past 2160 hour(s)).  Radiology: MR BRAIN WO CONTRAST  Result Date: 05/04/2020 CLINICAL DATA:  TIA; technologist note states episode of sudden onset double vision, word finding difficulty EXAM: MRI HEAD WITHOUT CONTRAST TECHNIQUE: Multiplanar, multiecho pulse sequences of the brain and surrounding structures were obtained without intravenous contrast. COMPARISON:  October 2020 FINDINGS: Brain: There is no acute infarction or intracranial hemorrhage. There is no intracranial mass, mass effect, or edema. There is no hydrocephalus or extra-axial fluid collection. Small chronic infarct of the left corona radiata. Small chronic infarct of the right cerebellum. Minimal additional patchy T2 hyperintensity in the supratentorial white matter is nonspecific but may reflect minor chronic microvascular ischemic changes. Vascular: Major vessel flow voids at the skull base are preserved. Skull and upper cervical spine: Normal marrow signal is preserved. Sinuses/Orbits: Paranasal sinuses are aerated. Orbits are unremarkable. Other: Sella is unremarkable.  Mastoid air cells are clear. IMPRESSION: No evidence of recent infarction, hemorrhage, or mass. Minor chronic microvascular ischemic changes and two chronic small vessel infarcts. No change  since October 2020. Electronically Signed   By: Macy Mis M.D.   On: 05/04/2020 14:36    No results found.  No results found.    Assessment and Plan: Patient Active Problem List   Diagnosis Date Noted   Migraine 03/12/2020   IBS (irritable bowel syndrome) 03/12/2020   S/P TKR (total knee replacement) using cement, right 01/01/2020   History of vaginal hysterectomy 06/11/2019   Prediabetes 04/22/2019   Estrogen deficiency  04/22/2019   Urinary frequency 04/22/2019   History of skin cancer 04/22/2019   High-tone pelvic floor dysfunction 04/03/2019   IC (interstitial cystitis) 04/03/2019   Neuralgia of both pudendal nerves 04/03/2019   Pelvic pain in female 04/03/2019   Osteoporosis 02/12/2019   Asthma 01/31/2019   Adverse reaction to influenza vaccine, initial encounter 12/03/2018   Vitamin D deficiency 12/03/2018   Restless legs syndrome 11/28/2018   Small fiber neuropathy 11/28/2018   Allergic rhinitis 10/08/2018   Benign essential hypertension 10/08/2018   Chronic bladder pain 10/08/2018   Neuropathy 10/08/2018   Osteoarthritis 10/08/2018   S/P BSO (bilateral salpingo-oophorectomy) 01/07/2018   OSA on CPAP 12/29/2016   Shortness of breath 06/24/2012   Other and unspecified disc disorder of lumbar region 10/25/2000   High cholesterol 09/24/1998   Tinnitus of both ears 09/24/1983    1. SOB (shortness of breath) PFT study reviewed - Spirometry with Graph  2. Mild intermittent asthma without complication Has been under control continue with present therapy  3. OSA (obstructive sleep apnea) On CPAP therapy no major issues  4. Obesity, morbid ([place]) Obesity Counseling: Had a lengthy discussion regarding patients BMI and weight issues. Patient was instructed on portion control as well as increased activity. Also discussed caloric restrictions with trying to maintain intake less than 2000 Kcal. Discussions were made in accordance with the 5As of weight management.  Simple actions such as not eating late and if able to, taking a walk is suggested.   5. CPAP use counseling CPAP Counseling: had a lengthy discussion with the patient regarding the importance of PAP therapy in management of the sleep apnea. Patient appears to understand the risk factor reduction and also understands the risks associated with untreated sleep apnea. Patient will try to make a good faith effort to remain compliant with therapy. Also instructed the patient on proper cleaning of the device including the water must be changed daily if possible and use of distilled water is preferred. Patient understands that the machine should be regularly cleaned with appropriate recommended cleaning solutions that do not damage the PAP machine for example given white vinegar and water rinses. Other methods such as ozone treatment may not be as good as these simple methods to achieve cleaning.    General Counseling: I have discussed the findings of the evaluation and examination with Erin Petersen.  I have also discussed any further diagnostic evaluation thatmay be needed or ordered today. Erin Petersen verbalizes understanding of the findings of todays visit. We also reviewed her medications today and discussed drug interactions and side effects including but not limited excessive drowsiness and altered mental states. We also discussed that there is always a risk not just to her but also people around her. Erin Petersen has been encouraged to call the office with any questions or concerns that should arise related to todays visit.  Orders Placed This Encounter  Procedures   Spirometry with Graph    Order Specific Question:   Where should this test be performed?    Answer:   [facility]    Order Specific Question:   Basic spirometry    Answer:   Yes    Order Specific Question:   Spirometry pre & post bronchodilator    Answer:   No     Time spent: 26  I have personally obtained a history, examined the patient,  evaluated laboratory and imaging results, formulated the assessment and plan and placed orders.    Allyne Gee, MD [facility]  Pulmonary and Critical Care Sleep medicine

## 2020-08-10 ENCOUNTER — Other Ambulatory Visit (INDEPENDENT_AMBULATORY_CARE_PROVIDER_SITE_OTHER): Payer: Medicare Other

## 2020-08-10 ENCOUNTER — Other Ambulatory Visit: Payer: Self-pay

## 2020-08-10 DIAGNOSIS — R7303 Prediabetes: Secondary | ICD-10-CM | POA: Diagnosis not present

## 2020-08-10 DIAGNOSIS — E559 Vitamin D deficiency, unspecified: Secondary | ICD-10-CM

## 2020-08-10 DIAGNOSIS — I10 Essential (primary) hypertension: Secondary | ICD-10-CM

## 2020-08-10 DIAGNOSIS — E7800 Pure hypercholesterolemia, unspecified: Secondary | ICD-10-CM | POA: Diagnosis not present

## 2020-08-10 LAB — CBC WITH DIFFERENTIAL/PLATELET
Basophils Absolute: 0.1 10*3/uL (ref 0.0–0.1)
Basophils Relative: 0.9 % (ref 0.0–3.0)
Eosinophils Absolute: 0.2 10*3/uL (ref 0.0–0.7)
Eosinophils Relative: 2.8 % (ref 0.0–5.0)
HCT: 37.6 % (ref 36.0–46.0)
Hemoglobin: 12.3 g/dL (ref 12.0–15.0)
Lymphocytes Relative: 37 % (ref 12.0–46.0)
Lymphs Abs: 2.6 10*3/uL (ref 0.7–4.0)
MCHC: 32.7 g/dL (ref 30.0–36.0)
MCV: 78.4 fl (ref 78.0–100.0)
Monocytes Absolute: 0.8 10*3/uL (ref 0.1–1.0)
Monocytes Relative: 11.7 % (ref 3.0–12.0)
Neutro Abs: 3.4 10*3/uL (ref 1.4–7.7)
Neutrophils Relative %: 47.6 % (ref 43.0–77.0)
Platelets: 362 10*3/uL (ref 150.0–400.0)
RBC: 4.8 Mil/uL (ref 3.87–5.11)
RDW: 15.1 % (ref 11.5–15.5)
WBC: 7.1 10*3/uL (ref 4.0–10.5)

## 2020-08-10 LAB — COMPREHENSIVE METABOLIC PANEL
ALT: 18 U/L (ref 0–35)
AST: 14 U/L (ref 0–37)
Albumin: 4.4 g/dL (ref 3.5–5.2)
Alkaline Phosphatase: 73 U/L (ref 39–117)
BUN: 26 mg/dL — ABNORMAL HIGH (ref 6–23)
CO2: 30 mEq/L (ref 19–32)
Calcium: 9.7 mg/dL (ref 8.4–10.5)
Chloride: 100 mEq/L (ref 96–112)
Creatinine, Ser: 1.14 mg/dL (ref 0.40–1.20)
GFR: 45.99 mL/min — ABNORMAL LOW (ref >60.00)
Glucose, Bld: 106 mg/dL — ABNORMAL HIGH (ref 70–99)
Potassium: 4.3 mEq/L (ref 3.5–5.1)
Sodium: 138 mEq/L (ref 135–145)
Total Bilirubin: 0.2 mg/dL (ref 0.2–1.2)
Total Protein: 6.8 g/dL (ref 6.0–8.3)

## 2020-08-10 LAB — LIPID PANEL
Cholesterol: 163 mg/dL (ref 0–200)
HDL: 36.2 mg/dL — ABNORMAL LOW (ref >39.00)
NonHDL: 127.09
Total CHOL/HDL Ratio: 5
Triglycerides: 368 mg/dL — ABNORMAL HIGH (ref 0.0–149.0)
VLDL: 73.6 mg/dL — ABNORMAL HIGH (ref 0.0–40.0)

## 2020-08-10 LAB — HEMOGLOBIN A1C: Hgb A1c MFr Bld: 6.7 % — ABNORMAL HIGH (ref 4.6–6.5)

## 2020-08-10 LAB — VITAMIN D 25 HYDROXY (VIT D DEFICIENCY, FRACTURES): VITD: 31.2 ng/mL (ref 30.00–100.00)

## 2020-08-10 LAB — LDL CHOLESTEROL, DIRECT: Direct LDL: 90 mg/dL

## 2020-08-10 LAB — TSH: TSH: 1.51 u[IU]/mL (ref 0.35–4.50)

## 2020-08-17 ENCOUNTER — Encounter: Payer: Medicare Other | Admitting: Family Medicine

## 2020-08-17 DIAGNOSIS — G4733 Obstructive sleep apnea (adult) (pediatric): Secondary | ICD-10-CM | POA: Diagnosis not present

## 2020-08-18 ENCOUNTER — Other Ambulatory Visit: Payer: Self-pay | Admitting: Orthopedic Surgery

## 2020-08-18 DIAGNOSIS — M25562 Pain in left knee: Secondary | ICD-10-CM

## 2020-08-19 ENCOUNTER — Encounter: Payer: Self-pay | Admitting: Podiatry

## 2020-08-19 ENCOUNTER — Other Ambulatory Visit: Payer: Self-pay

## 2020-08-19 ENCOUNTER — Ambulatory Visit: Payer: Medicare Other | Admitting: Podiatry

## 2020-08-19 DIAGNOSIS — L600 Ingrowing nail: Secondary | ICD-10-CM

## 2020-08-19 NOTE — Progress Notes (Signed)
Subjective: Erin Petersen is a 80 y.o.  female returns to office today for follow up evaluation after having left Hallux both medial lateral border nail avulsion performed. Patient has been soaking using epsom salt and applying topical antibiotic covered with bandaid daily. Patient denies fevers, chills, nausea, vomiting. Denies any calf pain, chest pain, SOB.   Objective:  Vitals: Reviewed  General: Well developed, nourished, in no acute distress, alert and oriented x3   Dermatology: Skin is warm, dry and supple bilateral.  Both medial lateral hallux nail border appears to be clean, dry, with mild granular tissue and surrounding scab. There is no surrounding erythema, edema, drainage/purulence. The remaining nails appear unremarkable at this time. There are no other lesions or other signs of infection present.  Neurovascular status: Intact. No lower extremity swelling; No pain with calf compression bilateral.  Musculoskeletal: Decreased tenderness to palpation of the both medial lateral hallux nail fold(s). Muscular strength within normal limits bilateral.   Assesement and Plan: S/p partial nail avulsion, doing well.   -Continue soaking in epsom salts twice a day followed by antibiotic ointment and a band-aid as needed. Can leave uncovered at night. .  -If the area does not heal properply, call the office for follow-up appointment, or sooner if any problems arise.  -Monitor for any signs/symptoms of infection. Call the office immediately if any occur or go directly to the emergency room. Call with any questions/concerns.  Boneta Lucks, DPM

## 2020-08-25 ENCOUNTER — Ambulatory Visit (INDEPENDENT_AMBULATORY_CARE_PROVIDER_SITE_OTHER): Payer: Medicare Other | Admitting: Family Medicine

## 2020-08-25 ENCOUNTER — Encounter: Payer: Self-pay | Admitting: Family Medicine

## 2020-08-25 ENCOUNTER — Other Ambulatory Visit: Payer: Self-pay

## 2020-08-25 VITALS — BP 128/76 | HR 73 | Temp 97.0°F | Ht 58.25 in | Wt 167.4 lb

## 2020-08-25 DIAGNOSIS — Z23 Encounter for immunization: Secondary | ICD-10-CM | POA: Diagnosis not present

## 2020-08-25 DIAGNOSIS — Z0000 Encounter for general adult medical examination without abnormal findings: Secondary | ICD-10-CM | POA: Insufficient documentation

## 2020-08-25 DIAGNOSIS — M810 Age-related osteoporosis without current pathological fracture: Secondary | ICD-10-CM | POA: Diagnosis not present

## 2020-08-25 DIAGNOSIS — R42 Dizziness and giddiness: Secondary | ICD-10-CM

## 2020-08-25 DIAGNOSIS — I10 Essential (primary) hypertension: Secondary | ICD-10-CM

## 2020-08-25 DIAGNOSIS — Z0001 Encounter for general adult medical examination with abnormal findings: Secondary | ICD-10-CM | POA: Insufficient documentation

## 2020-08-25 DIAGNOSIS — E782 Mixed hyperlipidemia: Secondary | ICD-10-CM

## 2020-08-25 DIAGNOSIS — E1165 Type 2 diabetes mellitus with hyperglycemia: Secondary | ICD-10-CM

## 2020-08-25 DIAGNOSIS — K219 Gastro-esophageal reflux disease without esophagitis: Secondary | ICD-10-CM | POA: Insufficient documentation

## 2020-08-25 DIAGNOSIS — Z1231 Encounter for screening mammogram for malignant neoplasm of breast: Secondary | ICD-10-CM | POA: Insufficient documentation

## 2020-08-25 DIAGNOSIS — E1169 Type 2 diabetes mellitus with other specified complication: Secondary | ICD-10-CM | POA: Diagnosis not present

## 2020-08-25 DIAGNOSIS — E559 Vitamin D deficiency, unspecified: Secondary | ICD-10-CM

## 2020-08-25 NOTE — Assessment & Plan Note (Signed)
Reviewed health habits including diet and exercise and skin cancer prevention Reviewed appropriate screening tests for age  Also reviewed health mt list, fam hx and immunization status , as well as social and family history   See HPI Labs reviewed  Colonoscopy utd Mammogram ordered  Tetanus shot postponed for ins Interested in shingrix and will check on coverage dexa utd , plan to inc D intake  One fall , fall prev discussed  Enc walking in safe place Adv directive is up to date  No cognitive concerns Worsening dizziness noted along with new DM range a1c- will plan DM teaching and f/u to discuss further  Enc her to address nausea with her GI provider  Hearing aides noted and vision screen is ok

## 2020-08-25 NOTE — Patient Instructions (Addendum)
Don't forget to schedule your mammogram   Pneumonia vaccine today   If you are interested in the new shingles vaccine (Shingrix) - call your local pharmacy to check on coverage and availability  If affordable, get on a wait list at your pharmacy to get the vaccine.  Increase your vitamin D over the counter to 4000 iu daily   For diabetes  Try to get most of your carbohydrates from produce (with the exception of white potatoes)  Eat less bread/pasta/rice/snack foods/cereals/sweets and other items from the middle of the grocery store (processed carbs)   Talk to your GI doctor about the worsened nausea with the change to omeprazole and also their thoughts about meloxicam   You will get a call to set up diabetic teaching in [place]   For kidney health- drink more water , if you can, cut meloxicam in 1/2 and take a half daily

## 2020-08-25 NOTE — Progress Notes (Signed)
Subjective:    Patient ID: Erin Petersen, female    DOB: 1940-09-26, 80 y.o.   MRN: [id number]  This visit occurred during the SARS-CoV-2 public health emergency.  Safety protocols were in place, including screening questions prior to the visit, additional usage of staff PPE, and extensive cleaning of exam room while observing appropriate contact time as indicated for disinfecting solutions.   HPI Pt presents for amw and health mt exam with rev of chronic medical problems   I have personally reviewed the Medicare Annual Wellness questionnaire and have noted 1. The patient's medical and social history 2. Their use of alcohol, tobacco or illicit drugs 3. Their current medications and supplements 4. The patient's functional ability including ADL's, fall risks, home safety risks and hearing or visual             impairment. 5. Diet and physical activities 6. Evidence for depression or mood disorders  The patients weight, height, BMI have been recorded in the chart and visual acuity is per eye clinic.  I have made referrals, counseling and provided education to the patient based review of the above and I have provided the pt with a written personalized care plan for preventive services. Reviewed and updated provider list, see scanned forms.  See scanned forms.  Routine anticipatory guidance given to patient.  See health maintenance. Colon cancer screening-- colonoscopy 2020 -may get recall 5 y  Breast cancer screening-due for mammogram Self breast exam-no lumps  Flu vaccine-1/22 Tetanus vaccine  03 Pneumovax  8/01 pna 23, wants the 20  Covid vaccinated plus booster Zoster vaccine -zostavax , interested in shingrix  Dexa 11/20 Osteoporosis  Had nausea from alendronate [place]-one when she got up fast (has chronic dizziness with nausea)  Fractures-broken rib in the past Supplements D 2000 iu  D level of 31 Exercise -walks (limited) -knee problems (MRI tomorrow)  In a walking  group  Advance directive-utd  Cognitive function addressed- see scanned forms- and if abnormal then additional documentation follows.   Has episodes of light headedness/nausea -for years /worse in last year This affects her thinking speed  Thinks it is sugar related   Disc chronic nausea from GI and originally ? If from gerd  Omeprazole - works less well    PMH and SH reviewed  Meds, vitals, and allergies reviewed.   ROS: See HPI.  Otherwise negative.    Weight : Wt Readings from Last 3 Encounters:  08/25/20 167 lb 7 oz (75.9 kg)  08/09/20 168 lb (76.2 kg)  04/08/20 161 lb (73 kg)   34.69 kg/m   Hearing/vision: Vision Screening   Right eye Left eye Both eyes  Without correction     With correction 20/30 20/40 20/25   Hearing Screening - Comments:: Pt wears hearing aids   Care team Kesa Birky-pcp [place] -gyn Patel- podiatry Gaines-ortho  Khan-pulm Vanga- GI  HTN bp is stable today  No cp or palpitations or headaches or edema  No side effects to medicines  BP Readings from Last 3 Encounters:  08/25/20 128/76  08/09/20 126/73  04/08/20 (!) 146/74    Amlodipine 5 mg daily  Hctz 25 mg daily  Pulse Readings from Last 3 Encounters:  08/25/20 73  08/09/20 85  04/08/20 83      Hyperlipidemia Lab Results  Component Value Date   CHOL 163 08/10/2020   CHOL 143 03/12/2020   CHOL 179 04/21/2019   Lab Results  Component Value Date   HDL 36.20 (L) 08/10/2020  HDL 41.90 03/12/2020   HDL 54.90 04/21/2019   Lab Results  Component Value Date   LDLCALC 67 03/12/2020   LDLCALC 97 04/21/2019   Lab Results  Component Value Date   TRIG 368.0 (H) 08/10/2020   TRIG 174.0 (H) 03/12/2020   TRIG 135.0 04/21/2019   Lab Results  Component Value Date   CHOLHDL 5 08/10/2020   CHOLHDL 3 03/12/2020   CHOLHDL 3 04/21/2019   Lab Results  Component Value Date   LDLDIRECT 90.0 08/10/2020   Simvastatin 20 mg daily   Prediabetes Lab Results  Component Value  Date   HGBA1C 6.7 (H) 08/10/2020  This is up from 6.4  Tries to watch carbs  Eating much better with the weight watchers program  Has a sweet tooth so she tries not to keep sweets in the house   Sees derm at least yearly   Patient Active Problem List   Diagnosis Date Noted   Medicare annual wellness visit, subsequent 08/25/2020   Encounter for screening mammogram for breast cancer 08/25/2020   Encounter for general adult medical examination with abnormal findings 08/25/2020   GERD (gastroesophageal reflux disease) 08/25/2020   Dizzy spells 08/25/2020   Migraine 03/12/2020   IBS (irritable bowel syndrome) 03/12/2020   S/P TKR (total knee replacement) using cement, right 01/01/2020   History of vaginal hysterectomy 06/11/2019   DM type 2 (diabetes mellitus, type 2) ([place]) 04/22/2019   Estrogen deficiency 04/22/2019   Urinary frequency 04/22/2019   History of skin cancer 04/22/2019   High-tone pelvic floor dysfunction 04/03/2019   IC (interstitial cystitis) 04/03/2019   Neuralgia of both pudendal nerves 04/03/2019   Pelvic pain in female 04/03/2019   Osteoporosis 02/12/2019   Asthma 01/31/2019   Adverse reaction to influenza vaccine, initial encounter 12/03/2018   Vitamin D deficiency 12/03/2018   Restless legs syndrome 11/28/2018   Small fiber neuropathy 11/28/2018   Allergic rhinitis 10/08/2018   Benign essential hypertension 10/08/2018   Chronic bladder pain 10/08/2018   Neuropathy 10/08/2018   Osteoarthritis 10/08/2018   S/P BSO (bilateral salpingo-oophorectomy) 01/07/2018   OSA on CPAP 12/29/2016   Shortness of breath 06/24/2012   Other and unspecified disc disorder of lumbar region 10/25/2000   DM type 2 with diabetic mixed hyperlipidemia ([place]) 09/24/1998   Tinnitus of both ears 09/24/1983   Past Medical History:  Diagnosis Date   Arthritis    Asthma    exercise induced asthma   Bacteremia due to Gram-negative bacteria 06/04/2019   Colon polyps    Duodenal ulcer     Dyspnea    GERD (gastroesophageal reflux disease)    Hyperlipidemia    Hypertension    Neuropathy    right leg and foot   Plantar fasciitis    Pre-diabetes    Prediabetes    RLQ abdominal pain    RLS (restless legs syndrome)    Sleep apnea    CPAP   SOB (shortness of breath)    Urinary incontinence    Urinary tract infection    Wears hearing aid in right ear    Past Surgical History:  Procedure Laterality Date   [place]     lumbar fusion   BILATERAL SALPINGOOPHORECTOMY  01/07/2018   BIOPSY  04/15/2019   Procedure: BIOPSY;  Surgeon: [name], MD;  Location: [place];  Service: Endoscopy;;   CARDIAC CATHETERIZATION  2019   COLONOSCOPY WITH PROPOFOL N/A 11/11/2018  Procedure: COLONOSCOPY WITH PROPOFOL;  Surgeon: [name], MD;  Location: [facility] ENDOSCOPY;  Service: Gastroenterology;  Laterality: N/A;   CYSTO WITH HYDRODISTENSION N/A 12/30/2018   Procedure: CYSTOSCOPY/HYDRODISTENSION WITH MARCAINE;  Surgeon: Bjorn Loser, MD;  Location: [facility];  Service: Urology;  Laterality: N/A;   ESOPHAGOGASTRODUODENOSCOPY (EGD) WITH PROPOFOL N/A 04/15/2019   Procedure: ESOPHAGOGASTRODUODENOSCOPY (EGD) WITH PROPOFOL;  Surgeon: [name], MD;  Location: [place];  Service: Endoscopy;  Laterality: N/A;  sleep apnea   ESOPHAGOGASTRODUODENOSCOPY (EGD) WITH PROPOFOL N/A 07/09/2019   Procedure: ESOPHAGOGASTRODUODENOSCOPY (EGD) WITH PROPOFOL;  Surgeon: [name], MD;  Location: [place];  Service: Gastroenterology;  Laterality: N/A;   EYE SURGERY Bilateral    HERNIA REPAIR     abd   HERNIA REPAIR     umbilical   INGUINAL HERNIA REPAIR Bilateral    inguinal   LAPAROSCOPIC BILATERAL SALPINGO OOPHERECTOMY     SPINAL FUSION  2002   TONSILLECTOMY AND ADENOIDECTOMY  1961   TOTAL KNEE ARTHROPLASTY Right 01/01/2020   Procedure: Right total knee arthroplasty - [name] to  Assist;  Surgeon: Hessie Knows, MD;  Location: [facility];  Service: Orthopedics;  Laterality: Right;   TUBAL LIGATION     Social History   Tobacco Use   Smoking status: Never   Smokeless tobacco: Never  Vaping Use   Vaping Use: Never used  Substance Use Topics   Alcohol use: Not Currently   Drug use: Never   Family History  Problem Relation Age of Onset   Alcohol abuse Mother    Cancer Mother    Stroke Mother    Alcohol abuse Father    Arthritis Sister    Arthritis Daughter    COPD Daughter    Arthritis Paternal Grandmother    Asthma Paternal Grandmother    COPD Paternal Grandmother    Breast cancer Paternal Aunt    Allergies  Allergen Reactions   Other Anaphylaxis    Bee Sting    Fosamax [Alendronate]     Nausea    Ciprofloxacin Hcl Rash    Mild rash after long term use   Sulfa Antibiotics Rash    Bactrim   Current Outpatient Medications on File Prior to Visit  Medication Sig Dispense Refill   acetaminophen (TYLENOL) 650 MG CR tablet Take 1,300 mg by mouth every 8 (eight) hours as needed for pain.      albuterol (VENTOLIN HFA) 108 (90 Base) MCG/ACT inhaler Inhale 2 puffs into the lungs every 6 (six) hours as needed for wheezing or shortness of breath.     amitriptyline (ELAVIL) 25 MG tablet Take 1 tablet (25 mg total) by mouth at bedtime. 90 tablet 3   amLODipine (NORVASC) 5 MG tablet Take 1 tablet (5 mg total) by mouth daily. 90 tablet 3   aspirin 81 MG EC tablet Take 81 mg by mouth at bedtime.      cephALEXin (KEFLEX) 500 MG capsule Take 4 pills one hour prior to dental appointment.     Cholecalciferol (VITAMIN D3) 50 MCG (2000 UT) TABS Take 2,000 Units by mouth daily.      Cyanocobalamin (VITAMIN B-12) 3000 MCG SUBL Take 3,000 mcg by mouth daily.     docusate sodium (COLACE) 100 MG capsule Take 1 capsule (100 mg total) by mouth 2 (two) times daily. 10 capsule 0   EPINEPHrine 0.3 mg/0.3 mL IJ SOAJ injection Inject 0.3 mg into the muscle as needed for anaphylaxis.  1 each 1   estradiol (  ESTRACE) 0.1 MG/GM vaginal cream Place 1 Applicatorful vaginally at bedtime.     fluticasone (FLONASE) 50 MCG/ACT nasal spray Place 2 sprays into both nostrils daily. 16 g 3   hydrochlorothiazide (HYDRODIURIL) 25 MG tablet Take 1 tablet (25 mg total) by mouth daily. 90 tablet 3   hydrOXYzine (ATARAX/VISTARIL) 25 MG tablet Take 1 tablet (25 mg total) by mouth at bedtime. 90 tablet 3   linaclotide (LINZESS) 145 MCG CAPS capsule TAKE 1 CAPSULE BY MOUTH  DAILY BEFORE BREAKFAST 90 capsule 1   Melatonin 10 MG TABS Take 10 mg by mouth at bedtime.     meloxicam (MOBIC) 15 MG tablet Take 1 tablet (15 mg total) by mouth daily. Take with food 90 tablet 3   ondansetron (ZOFRAN) 4 MG tablet Take 1 tablet (4 mg total) by mouth every 8 (eight) hours as needed for nausea or vomiting. 20 tablet 0   polyethylene glycol (MIRALAX / GLYCOLAX) 17 g packet Take 1 packet by mouth daily.      pregabalin (LYRICA) 150 MG capsule Take 1 capsule (150 mg total) by mouth 2 (two) times daily. (Patient taking differently: Take 150 mg by mouth. Take one tablet in am and 2 tabs in afternoon) 20 capsule 0   rOPINIRole (REQUIP) 1 MG tablet TAKE 1 TABLET IN THE MORNING AND 1 TABLET IN THE AFTERNOON 180 tablet 2   rOPINIRole (REQUIP) 4 MG tablet Take 1 tablet (4 mg total) by mouth at bedtime. 90 tablet 3   simvastatin (ZOCOR) 20 MG tablet Take 1 tablet (20 mg total) by mouth at bedtime. 90 tablet 3   traMADol (ULTRAM) 50 MG tablet Take 1 tablet (50 mg total) by mouth every 6 (six) hours as needed. 30 tablet 0   omeprazole (PRILOSEC) 40 MG capsule Take 1 capsule (40 mg total) by mouth daily before breakfast. 180 capsule 1   No current facility-administered medications on file prior to visit.    Review of Systems  Constitutional:  Positive for fatigue. Negative for activity change, appetite change, fever and unexpected weight change.  HENT:  Negative for congestion, ear pain, rhinorrhea, sinus pressure and sore  throat.   Eyes:  Negative for pain, redness and visual disturbance.  Respiratory:  Negative for cough, shortness of breath and wheezing.   Cardiovascular:  Negative for chest pain and palpitations.  Gastrointestinal:  Negative for abdominal pain, blood in stool, constipation and diarrhea.  Endocrine: Negative for polydipsia and polyuria.  Genitourinary:  Negative for dysuria, frequency and urgency.  Musculoskeletal:  Positive for arthralgias. Negative for back pain and myalgias.  Skin:  Negative for pallor and rash.  Allergic/Immunologic: Negative for environmental allergies.  Neurological:  Positive for dizziness. Negative for syncope and headaches.  Hematological:  Negative for adenopathy. Does not bruise/bleed easily.  Psychiatric/Behavioral:  Negative for decreased concentration and dysphoric mood. The patient is not nervous/anxious.       Objective:   Physical Exam Constitutional:      General: She is not in acute distress.    Appearance: Normal appearance. She is well-developed. She is obese. She is not ill-appearing or diaphoretic.  HENT:     Head: Normocephalic and atraumatic.     Right Ear: Tympanic membrane, ear canal and external ear normal.     Left Ear: Tympanic membrane, ear canal and external ear normal.     Nose: Nose normal. No congestion.     Mouth/Throat:     Mouth: Mucous membranes are moist.  Pharynx: Oropharynx is clear. No posterior oropharyngeal erythema.  Eyes:     General: No scleral icterus.    Extraocular Movements: Extraocular movements intact.     Conjunctiva/sclera: Conjunctivae normal.     Pupils: Pupils are equal, round, and reactive to light.  Neck:     Thyroid: No thyromegaly.     Vascular: No carotid bruit or JVD.  Cardiovascular:     Rate and Rhythm: Normal rate and regular rhythm.     Pulses: Normal pulses.     Heart sounds: Normal heart sounds.    No gallop.  Pulmonary:     Effort: Pulmonary effort is normal. No respiratory distress.      Breath sounds: Normal breath sounds. No wheezing.     Comments: Good air exch Chest:     Chest wall: No tenderness.  Abdominal:     General: Bowel sounds are normal. There is no distension or abdominal bruit.     Palpations: Abdomen is soft. There is no mass.     Tenderness: There is no abdominal tenderness.     Hernia: No hernia is present.  Genitourinary:    Comments: Breast exam: No mass, nodules, thickening, tenderness, bulging, retraction, inflamation, nipple discharge or skin changes noted.  No axillary or clavicular LA.     Musculoskeletal:        General: No tenderness. Normal range of motion.     Cervical back: Normal range of motion and neck supple. No rigidity. No muscular tenderness.     Right lower leg: No edema.     Left lower leg: No edema.     Comments: No kyphosis   Lymphadenopathy:     Cervical: No cervical adenopathy.  Skin:    General: Skin is warm and dry.     Coloration: Skin is not pale.     Findings: No erythema or rash.     Comments: Solar lentigines diffusely   Neurological:     Mental Status: She is alert. Mental status is at baseline.     Cranial Nerves: No cranial nerve deficit.     Motor: No abnormal muscle tone.     Coordination: Coordination normal.     Gait: Gait normal.     Deep Tendon Reflexes: Reflexes are normal and symmetric. Reflexes normal.  Psychiatric:        Mood and Affect: Mood normal.          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   Plans to check in with her GI provider         Endocrine   DM type 2 with diabetic mixed hyperlipidemia ([place])    Disc goals for lipids and reasons to control them Rev last labs with pt Rev low sat fat diet in detail LDL of 67  At goal with simvastatin 20 mg daily Trig are up likely due to new dm  DM type 2 (diabetes mellitus, type 2) ([facility])    Lab Results  Component Value Date   HGBA1C 6.7 (H) 08/10/2020  Disc low glycemic diet This is up  Ref made to DM teaching  Handout given re: DM2 Enc wt loss F/u 2-3 mo to make plan for treatment         Relevant Orders   Ambulatory referral to diabetic education     Musculoskeletal and Integument   Osteoporosis (Chronic)    dexa 11/20 One fall, no fractures since broken rib Did not tolerate alendronate D level 31- inst to double her supplement  Enc to keep walking           Other   Vitamin D deficiency    Level of 31 Would like this higher for bone health  inst to inc daily D3 intake to 4000 iu daily  Also diet sources       Medicare annual wellness visit, subsequent - Primary    Reviewed health habits including diet and exercise and skin cancer prevention Reviewed appropriate screening tests for age  Also reviewed health mt list, fam hx and immunization status , as well as social and family history   See HPI Labs reviewed  Colonoscopy utd Mammogram ordered  Tetanus shot postponed for ins Interested in shingrix and will check on coverage dexa utd , plan to inc D intake  One fall , fall prev discussed  Enc walking in safe place Adv directive is up to date  No cognitive concerns Worsening dizziness noted along with new DM range a1c- will plan DM teaching and f/u to discuss further  Enc her to address nausea with her GI provider  Hearing aides noted and vision screen is ok        Encounter for screening mammogram for breast cancer    Referral done for mammogram  Pt will call to schedule         Relevant Orders   MM 3D SCREEN BREAST BILATERAL   Encounter for general adult medical examination with abnormal findings    Reviewed health habits including diet and exercise and skin cancer prevention Reviewed appropriate screening tests for age  Also reviewed health mt list, fam hx and immunization status , as well as social and family history   See HPI Labs reviewed  Colonoscopy utd Mammogram ordered  Tetanus shot postponed for ins Interested in shingrix and will check on coverage dexa utd , plan to inc D intake  One fall , fall prev discussed  Enc walking in safe place Adv directive is up to date  No cognitive concerns Worsening dizziness noted along with new DM range a1c- will plan DM teaching and f/u to discuss further  Enc her to address nausea with her GI provider  Hearing aides noted and vision screen is ok        Dizzy spells    Acute on chronic in pt fairly new to me who is medically complex Disc need to eat regularly  Rev med Will f/u to disc further  Pt thinks no past w/u of this but unsure        Other Visit Diagnoses     Need for vaccination against Streptococcus pneumoniae       Relevant Orders   Pneumococcal conjugate vaccine 20-valent (Prevnar 20) (Completed)

## 2020-08-25 NOTE — Assessment & Plan Note (Signed)
bp in fair control at this time  BP Readings from Last 1 Encounters:  08/25/20 128/76   No changes needed Most recent labs reviewed  Disc lifstyle change with low sodium diet and exercise  Plans to continue amlodipine 5 mg daily and hctz 25 mg daily

## 2020-08-25 NOTE — Assessment & Plan Note (Signed)
Level of 31 Would like this higher for bone health  inst to inc daily D3 intake to 4000 iu daily  Also diet sources

## 2020-08-25 NOTE — Assessment & Plan Note (Signed)
Sees GI  Omeprazole does not work as well as prev med Constantly nauseated  Plans to check in with her GI provider

## 2020-08-25 NOTE — Assessment & Plan Note (Signed)
Lab Results  Component Value Date   HGBA1C 6.7 (H) 08/10/2020   Disc low glycemic diet This is up  Ref made to DM teaching  Handout given re: DM2 Enc wt loss F/u 2-3 mo to make plan for treatment

## 2020-08-25 NOTE — Assessment & Plan Note (Signed)
dexa 11/20 One fall, no fractures since broken rib Did not tolerate alendronate D level 31- inst to double her supplement  Enc to keep walking

## 2020-08-25 NOTE — Assessment & Plan Note (Signed)
Acute on chronic in pt fairly new to me who is medically complex Disc need to eat regularly  Rev med Will f/u to disc further  Pt thinks no past w/u of this but unsure

## 2020-08-25 NOTE — Assessment & Plan Note (Signed)
Referral done for mammogram  Pt will call to schedule

## 2020-08-25 NOTE — Assessment & Plan Note (Signed)
Disc goals for lipids and reasons to control them Rev last labs with pt Rev low sat fat diet in detail LDL of 67  At goal with simvastatin 20 mg daily Trig are up likely due to new dm

## 2020-08-26 ENCOUNTER — Ambulatory Visit
Admission: RE | Admit: 2020-08-26 | Discharge: 2020-08-26 | Disposition: A | Discharge status: Home / Self Care | Payer: Medicare Other | Source: Ambulatory Visit | Attending: Orthopedic Surgery | Admitting: Orthopedic Surgery

## 2020-08-26 DIAGNOSIS — M25562 Pain in left knee: Secondary | ICD-10-CM | POA: Diagnosis not present

## 2020-09-05 DIAGNOSIS — I10 Essential (primary) hypertension: Secondary | ICD-10-CM | POA: Diagnosis not present

## 2020-09-05 DIAGNOSIS — B029 Zoster without complications: Secondary | ICD-10-CM | POA: Diagnosis not present

## 2020-09-06 ENCOUNTER — Telehealth: Payer: Self-pay

## 2020-09-06 NOTE — Telephone Encounter (Signed)
Erin Petersen - Client TELEPHONE ADVICE RECORD [facility] Patient Name: Erin Petersen Gender: Female DOB: Dec 13, 1940 Age: 80 Y 11 M 13 D Return Phone Number: [phone] (Primary) Address: City/ State/ Zip: [place]  [address] Client [place] Petersen - Client Client Site [place] MD Contact Type Call Who Is Calling Patient / Member / Family / Caregiver Call Type Triage / Clinical Relationship To Patient Self Return Phone Number [phone] (Primary) Chief Complaint Blood Pressure High Reason for Call Symptomatic / Request for [place] states her blood pressure is167/100 Translation No Nurse Assessment Nurse: Valentino Nose, RN, [place] Date/Time (Eastern Time): 09/06/2020 11:54:22 AM Confirm and document reason for call. If symptomatic, describe symptoms. ---Caller states her BP is high. States she went to a walk in clinic yesterday and its been high ever since. This morning it was 167/100. States she is in a very stressful situation right now along with a rash on her belly. Does the patient have any new or worsening symptoms? ---Yes Will a triage be completed? ---Yes Related visit to physician within the last 2 weeks? ---No Does the PT have any chronic conditions? (i.e. diabetes, asthma, this includes High risk factors for pregnancy, etc.) ---Yes List chronic conditions. ---HTN, restless leg syndrome, fibromyalgia, acid reflux, high cholesterol, interstitial cystitis Is this a behavioral health or substance abuse call? ---No Guidelines Guideline Title Affirmed Question Affirmed Notes Nurse Date/Time (Eastern Time) Blood Pressure - High Systolic BP >= 037 OR Diastolic >= 048 Valentino Nose, RN, [place] 09/06/2020 11:56:34 AM Disp. Time [name] Ghazi Time) Disposition Final User 09/06/2020 11:59:43 AM SEE PCP WITHIN 3 DAYS Yes Valentino Nose,  RN, [place] PLEASE NOTE: All timestamps contained within this report are represented as [place] Standard Time. CONFIDENTIALTY NOTICE: This fax transmission is intended only for the addressee. It contains information that is legally privileged, confidential or otherwise protected from use or disclosure. If you are not the intended recipient, you are strictly prohibited from reviewing, disclosing, copying using or disseminating any of this information or taking any action in reliance on or regarding this information. If you have received this fax in error, please notify us immediately by telephone so that we can arrange for its return to [place]. Phone: [phone], Toll-Free: [phone], Fax: [phone] Page: 2 of 2 Call Id: [id number] [place] Disagree/Comply Comply Caller Understands Yes PreDisposition Call Doctor Care Advice Given Per Guideline SEE PCP WITHIN 3 DAYS: * PCP VISIT: Call your doctor (or NP/PA) during regular office hours and make an appointment. A clinic or urgent care center are good places to go for care if your doctor's office is closed or you can't get an appointment. NOTE: If office will be open tomorrow, tell caller to call then, not in 3 days. HIGH BLOOD PRESSURE - LIFESTYLE MODIFICATIONS: * DECREASE SODIUM INTAKE: Aim to eat less than 2.4 g (100 mmol) of sodium each Petersen. Unfortunately 75% of the salt in the average person's diet is in pre-processed foods. * EAT HEALTHY: Eat a diet rich in fresh fruits and vegetables, dietary fiber, non-animal protein (e.g., soy), and low-fat dairy products. Avoid foods with a high content of saturated fat or cholesterol. * LIMIT ALCOHOL: Limit alcoholic beverages to no more than one drink per Petersen for women and no more than two drinks for men. A drink is 1.5 oz hard liquor (one shot or jigger; 45 ml), 5 oz wine (small glass; 150 ml), 12  oz beer (one can; 360 ml). * EXERCISE, BE MORE PHYSICALLY ACTIVE: Do at least 30 minutes of aerobic exercise  (e.g., brisk walking) most days of the week. Other examples of aerobic activities cycling, jogging, and swimming. * REDUCE WEIGHT AND WAISTLINE: It is important to maintain a normal body weight. The goal should be a BMI (body mass index) under 25 for men and women, a waist circumference under 40 inches (102 cm) in men, and a waist circumference under 35 inches (88 cm) in women. * REDUCE STRESS: Find activities that help reduce your stress. Examples might include meditation, yoga, or even a restful walk in a park. CALL BACK IF: * Weakness or numbness of the face, arm or leg on one side of the body occurs * Difficulty walking, difficulty talking, or severe headache occurs * Your blood pressure is over 180/110 * You become worse * Chest pain or difficulty breathing occurs * The following things can help you reduce your blood pressure. Comments User: Erin Peace, RN Date/Time [name] Ghazi Time): 09/06/2020 11:57:25 AM Caller states she re took her BP 120/58

## 2020-09-06 NOTE — Telephone Encounter (Signed)
Pt was seen in UC yesterday and has apt with Dr. Einar Pheasant on 7/14

## 2020-09-06 NOTE — Telephone Encounter (Signed)
Noted will evaluate later this week

## 2020-09-08 ENCOUNTER — Other Ambulatory Visit: Payer: Self-pay | Admitting: Orthopedic Surgery

## 2020-09-08 ENCOUNTER — Ambulatory Visit: Payer: Medicare Other | Attending: Obstetrics and Gynecology

## 2020-09-08 ENCOUNTER — Other Ambulatory Visit: Payer: Self-pay

## 2020-09-08 VITALS — BP 126/61 | HR 70

## 2020-09-08 DIAGNOSIS — R278 Other lack of coordination: Secondary | ICD-10-CM | POA: Insufficient documentation

## 2020-09-08 DIAGNOSIS — M25562 Pain in left knee: Secondary | ICD-10-CM | POA: Insufficient documentation

## 2020-09-08 DIAGNOSIS — M6281 Muscle weakness (generalized): Secondary | ICD-10-CM | POA: Diagnosis not present

## 2020-09-08 DIAGNOSIS — G8929 Other chronic pain: Secondary | ICD-10-CM | POA: Insufficient documentation

## 2020-09-08 DIAGNOSIS — M25551 Pain in right hip: Secondary | ICD-10-CM | POA: Insufficient documentation

## 2020-09-08 DIAGNOSIS — R2689 Other abnormalities of gait and mobility: Secondary | ICD-10-CM | POA: Insufficient documentation

## 2020-09-08 NOTE — Therapy (Signed)
[place] MAIN [facility] SERVICES [address] [place], [place], [address] Phone: [phone]   Fax:  [phone]  Physical Therapy Evaluation  Patient Details  Name: Erin Petersen MRN: [id number] Date of Birth: 1940-07-24 Referring Provider (PT): Benjaman Kindler, MD   Encounter Date: 09/08/2020   PT End of Session - 09/08/20 1211     Visit Number 1    Number of Visits 10    Date for PT Re-Evaluation 11/07/20    Authorization Type BCBS Medicare-no auth required    Progress Note Due on Visit 10    PT Start Time 1101    PT Stop Time 1208    PT Time Calculation (min) 67 min    Activity Tolerance Patient tolerated treatment well;No increased pain    Behavior During Therapy [facility] for tasks assessed/performed             Past Medical History:  Diagnosis Date   Arthritis    Asthma    exercise induced asthma   Bacteremia due to Gram-negative bacteria 06/04/2019   Colon polyps    Duodenal ulcer    Dyspnea    GERD (gastroesophageal reflux disease)    Hyperlipidemia    Hypertension    Neuropathy    right leg and foot   Plantar fasciitis    Pre-diabetes    Prediabetes    RLQ abdominal pain    RLS (restless legs syndrome)    Sleep apnea    CPAP   SOB (shortness of breath)    Urinary incontinence    Urinary tract infection    Wears hearing aid in right ear     Past Surgical History:  Procedure Laterality Date   [place]     lumbar fusion   BILATERAL SALPINGOOPHORECTOMY  01/07/2018   BIOPSY  04/15/2019   Procedure: BIOPSY;  Surgeon: [name], MD;  Location: [place];  Service: Endoscopy;;   CARDIAC CATHETERIZATION  2019   COLONOSCOPY WITH PROPOFOL N/A 11/11/2018   Procedure: COLONOSCOPY WITH PROPOFOL;  Surgeon: [name], MD;  Location: [facility];  Service: Gastroenterology;  Laterality: N/A;   CYSTO WITH HYDRODISTENSION N/A 12/30/2018    Procedure: CYSTOSCOPY/HYDRODISTENSION WITH MARCAINE;  Surgeon: Bjorn Loser, MD;  Location: [facility];  Service: Urology;  Laterality: N/A;   ESOPHAGOGASTRODUODENOSCOPY (EGD) WITH PROPOFOL N/A 04/15/2019   Procedure: ESOPHAGOGASTRODUODENOSCOPY (EGD) WITH PROPOFOL;  Surgeon: [name], MD;  Location: [place];  Service: Endoscopy;  Laterality: N/A;  sleep apnea   ESOPHAGOGASTRODUODENOSCOPY (EGD) WITH PROPOFOL N/A 07/09/2019   Procedure: ESOPHAGOGASTRODUODENOSCOPY (EGD) WITH PROPOFOL;  Surgeon: [name], MD;  Location: [place];  Service: Gastroenterology;  Laterality: N/A;   EYE SURGERY Bilateral    HERNIA REPAIR     abd   HERNIA REPAIR     umbilical   INGUINAL HERNIA REPAIR Bilateral    inguinal   LAPAROSCOPIC BILATERAL SALPINGO OOPHERECTOMY     SPINAL FUSION  2002   TONSILLECTOMY AND ADENOIDECTOMY  1961   TOTAL KNEE ARTHROPLASTY Right 01/01/2020   Procedure: Right total knee arthroplasty - [name] to Assist;  Surgeon: Hessie Knows, MD;  Location: [facility];  Service: Orthopedics;  Laterality: Right;   TUBAL LIGATION      Vitals:   09/08/20 1121  BP: 126/61  Pulse: 70      Subjective Assessment - 09/08/20 1103     Subjective Pt has  had abdominal pain for years and years, approx. eight years per MD notes. Pt reported she had PT at [facility] before and did not feel llike it helped pelvic pain, as PT had to address alignment first. Pt would like to target pelvic floor specifically in order to decr. pain prior to L TKA on 10/12/20. Pt was told by Dr. Amalia Hailey that her bladder is the size of a child, and has IC. Pt wakes up with burning, painful sensation. Pt had hysterectomy (had parital and now total) and MD lifted bladder. Pt unable to lie on R side for prolonged periods of time. Pt reportd she went to walk in clinic and started medicine for shingles in L UE. [name] BP has been fluctuating at home, ranging: 397Q-73A/LPFXTK of diastolic. Urinary: pt  reported burning sensation when bladder is full, voids approx. once in 2 hours. Pt has leakage at night, and wears pad. Pt nocturia, 3-4 times/night. Pt reported very rarely will have SUI. Bowel: pt has IBS and BMs vary to every day to every 4-5 days, takes medication. She will have a BM and then diarrhea, then constipation. Pt reports stool is type 4 on [place] Stool Chart 50% of the time, then type 7 and types 1-3. Sexual function: pt is not currently sexually active, due to pain. Pt also has pain with speculum insertion. Core: Pt reports hx of LBP, and R groin pain when lying on her R side. Unable to walk right now 2/2 L knee pain. LBP incr. with walking and lifting. Pt has hx of falling down flights of stairs 4-5 when she was younger. She has also fallen forward. Bending forward incr. back pain.    Pertinent History HTN, migraine, OSA on CPAP, asthma, IBS, GERD, HLD, neuropathy, osteoporosis, OA, L TKA scheduled for 10/12/20. hx of R TKA, hx of Lumbar spine fusion, IC, chronic bladder pain, RLS, B tinnitus, Vit D deficiency, neuralgia of B pudenal nerves, hx of skin CA, hx of hysterectomy, dizziness, pt has hearing aides in B ears, colon polyps, duodenal ulcer, plantar fasciitis, hx of UTIs    How long can you sit comfortably? 1-2 hours    How long can you stand comfortably? 3-4 minutes    How long can you walk comfortably? limited by knee: leans on cart at grocery store, can walk to mailbox, not around block    Patient Stated Goals "Get rid of the vaginal tightness and improve IC."    Currently in Pain? No/denies                [facility] PT Assessment - 09/08/20 1142       Assessment   Medical Diagnosis Myalgia other site, pelvic and perineal pain    Referring Provider (PT) Benjaman Kindler, MD    Onset Date/Surgical Date --   approx. eight years ago   Hand Dominance Right    Prior Therapy prior pelvic health at [facility] (last year)      Precautions   Precautions Fall      Restrictions    Weight Bearing Restrictions No      Balance Screen   Has the patient fallen in the past 6 months Yes    How many times? 1    Has the patient had a decrease in activity level because of a fear of falling?  Yes    Is the patient reluctant to leave their home because of a fear of falling?  No      Home Environment  Living Environment Private residence    Living Arrangements Alone    Available Help at Discharge Friend(s);Available PRN/intermittently    Type of Home House    Home Access Stairs to enter    Entrance Stairs-Number of Steps 1    Entrance Stairs-Rails None    Home Layout One level    Gordon - 2 wheels;Cane - single point;Bedside commode;Shower seat - built in;Grab bars - tub/shower;Grab bars - toilet      Prior Function   Level of Independence Independent    Vocation Retired    Solicitor, games with friends, used to walk but unable to with knees      Cognition   Overall Cognitive Status Within Functional Limits for tasks assessed                        Objective measurements completed on examination: See above findings.     Pelvic Floor Special Questions - 09/08/20 1134     Are you Pregnant or attempting pregnancy? No    Prior Pregnancies Yes    Number of Pregnancies 6   4 live births and 2 miscarriages   Number of C-Sections 0    Number of Vaginal Deliveries 4    Any difficulty with labor and deliveries Yes   with her second child, the child did not live after birth   Episiotomy Performed Yes    Diastasis Recti Pt was told she had disastasis and had surgery to fix a hernia and had a tummy tuck.    Currently Sexually Active No   2/2 pain   History of sexually transmitted disease No    Urinary Leakage Yes    How often Every night    Pad use 1    Activities that cause leaking With strong urge;Walking    Fecal incontinence No    Fluid intake Thermos (approx. 32 oz.) in the morning and in the afternoon, and in the evening.  Plus water to take pills in the morning and night. Pt has approx. 2 cups of either coffee/tea/soda (decaf)    Caffeine beverages none    Falling out feeling (prolapse) No               Pelvic Floor Physical Therapy Evaluation and Assessment  Screenings: Red Flags: no Have you had any night sweats? no Unexplained weight loss? no Saddle anesthesia? no Unexplained changes in bowel or bladder changes? no   OBJECTIVE  Posture/Observations:  Sitting: FHP, rounded shoulders, crossed feet at ankles Standing: HFP, rounded shoulders     Range of Motion/Flexibilty:  Spine:Lumbar fusion L2 to sacrum? Pt reported no pain during ext, flex, sidebending, rotation. S required during ROM 2/2 incr. Postural sway. Hips: will perform next session   Strength/MMT:  LE MMT  B knee ext: 4/5 B knee flex: 4/5 B ankle DF: 4/5 LE MMT Left Right  Hip flex:  (L2) 4-/5 4-/5  Hip ext: /5 /5  Hip TKZ:SWFUXN 3+/5 3+/5  Hip add: /5 /5  Hip IR /5 /5  Hip ER /5 /5          Gait Analysis: wide BOS with decr. Trunk rotation and arm swing.    Pelvic Floor Outcome Measures: FOTO: bowel constipation: 52; urinary issues: 58       SELF CARE:  PT Education - 09/08/20 1209     Education Details PT educated pt on PT POC, frequency, duration, and exam findings. PT educated  pt on proper toileting posture to fully empty bladder and reduce strain with constipation.    Person(s) Educated Patient    Methods Explanation;Demonstration;Verbal cues;Handout    Comprehension Verbalized understanding;Returned demonstration;Need further instruction              PT Short Term Goals - 09/08/20 1323       PT SHORT TERM GOAL #1   Title Pt will be IND in HEP to improve leakage, strength, posture, and balance. TARGET DATE FOR ALL STGS: 10/06/20    Time 4    Period Weeks    Status New      PT SHORT TERM GOAL #2   Title Pt will report ability to stand for >/= 10 minutes without incr. in pain in  order to meal prep.    Time 4    Period Weeks    Status New      PT SHORT TERM GOAL #3   Title Perform pelvic muscle assessment and write goals.    Time 4    Period Weeks    Status New      PT SHORT TERM GOAL #4   Title Pt will verbalize proper toileting posture to fully empty bladder and not strain during bowel movements.    Time 4    Period Weeks    Status New               PT Long Term Goals - 09/08/20 1325       PT LONG TERM GOAL #1   Title Pt will report improve of bowel constipation on FOTO to 59 points and improve urinary issues score to 60 points in order to improve QOL.    Time 10    Period Weeks    Status New      PT LONG TERM GOAL #2   Title Pt will report getting up </=1 time per night to void in order to improve sleep and QOL.    Baseline 3-4 times.    Time 10    Period Weeks    Status New      PT LONG TERM GOAL #3   Title Pt will be able to perform pelvic floor contraction, relaxation and bulge in order to improve strength and relaxation of pelvic floor.    Time 10    Period Weeks    Status New      PT LONG TERM GOAL #4   Title Pt will demo decreased abdominal scar adhesions to progress to deep core exercises and improve motility    Baseline pt states she has not been performing this.    Time 10    Period Weeks    Status New      PT LONG TERM GOAL #5   Title Pt will report type 3 or 4 bowel movments on [place] Stool Chart 75% of the time to reduce strain.    Time 10    Period Weeks    Status New                    Plan - 09/08/20 1212     Clinical Impression Statement Pt is a pleasant 79y/o female presenting to pelvic health OPPT for pelvic and perineal pain and incontinence. [name] PMH significant for the following: recent DM diagnosis, undergoing treatment for shingles, HTN, migraine, OSA on CPAP, asthma, IBS, GERD, HLD, neuropathy, osteoporosis, OA, L TKA scheduled for 10/12/20. hx of R TKA, hx of Lumbar spine fusion, IC, chronic  bladder pain, RLS, B tinnitus, Vit D deficiency, neuralgia of B pudenal nerves, hx of skin CA, hx of hysterectomy, dizziness, pt has hearing aides in B ears, colon polyps, duodenal ulcer, plantar fasciitis, hx of UTIs. FOTO, bowel constipation score: 52, with higher scores indicating better function; urinary issues: 58, with higher scores indicating better function. The following impairments were noted upon exam: gait deviations, incontinece, pain, decr. strength, postural dysfunction, decr. ROM and flexibility, impaired balance, and decr. coordination. Pt would benefit from skilled PT to improve safety, strength, balance, and leakage during all ADLs.    Personal Factors and Comorbidities Age;Past/Current Experience;Comorbidity 3+;Time since onset of injury/illness/exacerbation    Comorbidities HTN, migraine, OSA on CPAP, asthma, IBS, GERD, HLD, neuropathy, osteoporosis, OA, L TKA scheduled for 10/12/20. hx of R TKA, hx of Lumbar spine fusion, IC, chronic bladder pain, RLS, B tinnitus, Vit D deficiency, neuralgia of B pudenal nerves, hx of skin CA, hx of hysterectomy, dizziness, pt has hearing aides in B ears, colon polyps, duodenal ulcer, plantar fasciitis, hx of UTIs    Examination-Activity Limitations Bed Mobility;Bend;Continence;Lift;Toileting;Stand;Squat;Sleep;Locomotion Level;Transfers    Examination-Participation Restrictions Meal Prep;Cleaning;Interpersonal Relationship;Laundry    Stability/Clinical Decision Making Evolving/Moderate complexity    Clinical Decision Making Moderate    Rehab Potential Good    PT Frequency 1x / week    PT Duration Other (comment)   10 weeks, however, pt is having L TKA on 10/12/20   PT Treatment/Interventions ADLs/Self Care Home Management;Biofeedback;Neuromuscular re-education;Balance training;Therapeutic exercise;Therapeutic activities;Functional mobility training;Gait training;Patient/family education;Manual techniques;Dry needling    PT Next Visit Plan Further  assess spine and hip ROM/mobility (cautious 2/2 osteoporosis), internal pelvic muslce assessment, manual therapy as needed, assess for LLD, provide HEP for relaxation and B hip/LE strength. Balance training.    Consulted and Agree with Plan of Care Patient             Patient will benefit from skilled therapeutic intervention in order to improve the following deficits and impairments:  Abnormal gait, Decreased coordination, Decreased range of motion, Pain, Decreased balance, Hypomobility, Impaired flexibility, Decreased mobility, Decreased strength, Postural dysfunction  Visit Diagnosis: Other abnormalities of gait and mobility - Plan: PT plan of care cert/re-cert  Other lack of coordination - Plan: PT plan of care cert/re-cert  Muscle weakness (generalized) - Plan: PT plan of care cert/re-cert  Pain in right hip - Plan: PT plan of care cert/re-cert  Chronic pain of left knee - Plan: PT plan of care cert/re-cert     Problem List Patient Active Problem List   Diagnosis Date Noted   Medicare annual wellness visit, subsequent 08/25/2020   Encounter for screening mammogram for breast cancer 08/25/2020   Encounter for general adult medical examination with abnormal findings 08/25/2020   GERD (gastroesophageal reflux disease) 08/25/2020   Dizzy spells 08/25/2020   Migraine 03/12/2020   IBS (irritable bowel syndrome) 03/12/2020   S/P TKR (total knee replacement) using cement, right 01/01/2020   History of vaginal hysterectomy 06/11/2019   DM type 2 (diabetes mellitus, type 2) ([place]) 04/22/2019   Estrogen deficiency 04/22/2019   History of skin cancer 04/22/2019   High-tone pelvic floor dysfunction 04/03/2019   IC (interstitial cystitis) 04/03/2019   Neuralgia of both pudendal nerves 04/03/2019   Pelvic pain in female 04/03/2019   Osteoporosis 02/12/2019   Asthma 01/31/2019   Adverse reaction to influenza vaccine, initial encounter 12/03/2018   Vitamin D deficiency 12/03/2018    Restless legs syndrome 11/28/2018   Small fiber neuropathy 11/28/2018  Allergic rhinitis 10/08/2018   Benign essential hypertension 10/08/2018   Chronic bladder pain 10/08/2018   Neuropathy 10/08/2018   Osteoarthritis 10/08/2018   S/P BSO (bilateral salpingo-oophorectomy) 01/07/2018   OSA on CPAP 12/29/2016   Other and unspecified disc disorder of lumbar region 10/25/2000   DM type 2 with diabetic mixed hyperlipidemia ([place]) 09/24/1998   Tinnitus of both ears 09/24/1983    Hoa Briggs L 09/08/2020, 1:31 PM  [place] MAIN [facility] SERVICES [address] [place], [place], [address] Phone: [phone]   Fax:  [phone]  Name: Erin Petersen MRN: [id number] Date of Birth: [birth year]  Geoffry Paradise, PT,DPT 09/08/20 1:34 PM Phone: [phone] Fax: [phone]

## 2020-09-08 NOTE — Patient Instructions (Signed)
Toileting posture:  1) Peeing posture: feet flat, lean forward and forearms on legs to relax pelvic floor.  2) Pooping posture: feet up on squatty potty (or stool so knees are higher than hips), lean forward to relax pelvic floor.

## 2020-09-09 ENCOUNTER — Other Ambulatory Visit: Payer: Self-pay

## 2020-09-09 ENCOUNTER — Ambulatory Visit (INDEPENDENT_AMBULATORY_CARE_PROVIDER_SITE_OTHER): Payer: Medicare Other | Admitting: Family Medicine

## 2020-09-09 VITALS — BP 142/70 | HR 75 | Temp 97.5°F | Wt 169.5 lb

## 2020-09-09 DIAGNOSIS — R21 Rash and other nonspecific skin eruption: Secondary | ICD-10-CM | POA: Insufficient documentation

## 2020-09-09 DIAGNOSIS — Z8619 Personal history of other infectious and parasitic diseases: Secondary | ICD-10-CM

## 2020-09-09 DIAGNOSIS — I10 Essential (primary) hypertension: Secondary | ICD-10-CM | POA: Diagnosis not present

## 2020-09-09 DIAGNOSIS — G4733 Obstructive sleep apnea (adult) (pediatric): Secondary | ICD-10-CM | POA: Diagnosis not present

## 2020-09-09 DIAGNOSIS — R400 Somnolence: Secondary | ICD-10-CM | POA: Insufficient documentation

## 2020-09-09 DIAGNOSIS — Z9989 Dependence on other enabling machines and devices: Secondary | ICD-10-CM

## 2020-09-09 MED ORDER — TRIAMCINOLONE ACETONIDE 0.1 % EX CREA: 1.0000 "application " | TOPICAL_CREAM | Freq: Two times a day (BID) | CUTANEOUS | 0 refills | Status: DC

## 2020-09-09 NOTE — Patient Instructions (Addendum)
#  Daytime Sleepiness - Reach to neurologist - say you use your CPAP and that you are still falling asleep easily and if he or someone at he office could see you to discuss  #Rash - Use triamcinolone Cream twice daily  #Blood Pressure - Hold amlodipine - continue to monitor blood pressure - if consisting >160/100, recommend restarting - otherwise ok to hold as long no chest pain, breathing difficulty, headaches - see if this improves the daytime sleepiness

## 2020-09-09 NOTE — Assessment & Plan Note (Signed)
Endorses strict adherence to CPAP. But still with daytime sleepiness

## 2020-09-09 NOTE — Progress Notes (Signed)
Subjective:     Erin Petersen is a 80 y.o. female presenting for Rash (L arm and abdomen X 3-4 weeks. UC diagnosed with her with shingles on 09/05/20)     HPI  #Abdominal rash - more obvious when getting out of the shower - itching in the lower abdomen and travels to the waist - on and off x 6 months - treatment: 1% cortisone cream w/o improvement  #Shingles - last Thursday diagnosed with shingles - tingling in the arm - went to UC and diagnosed with shingles - the next night had a rash over the area of the tingling - friend looked them and thought they were vesicles - treated for this and seems to be improved  #Elevated bp - was high at [facility] - was 160-179/76-100 - since then has had 89-115/47-72 - did get some low bp and was not feeling well at this time - concerned because she has been falling asleep easily in the morning   #OSA - endorses being complaint with machine - in the last 90 days missed one day - falling asleep while driving and in conversation with people - endorses waking up to urinate due to interstitial cystitis  -   Review of Systems   Social History   Tobacco Use  Smoking Status Never  Smokeless Tobacco Never        Objective:    BP Readings from Last 3 Encounters:  09/09/20 (!) 142/70  09/08/20 126/61  08/25/20 128/76   Wt Readings from Last 3 Encounters:  09/09/20 169 lb 8 oz (76.9 kg)  08/25/20 167 lb 7 oz (75.9 kg)  08/09/20 168 lb (76.2 kg)    BP (!) 142/70   Pulse 75   Temp (!) 97.5 F (36.4 C) (Temporal)   Wt 169 lb 8 oz (76.9 kg)   LMP  (LMP Unknown)   SpO2 98%   BMI 35.12 kg/m    Physical Exam Constitutional:      General: She is not in acute distress.    Appearance: She is well-developed. She is not diaphoretic.  HENT:     Right Ear: External ear normal.     Left Ear: External ear normal.  Eyes:     Conjunctiva/sclera: Conjunctivae normal.  Cardiovascular:     Rate and Rhythm: Normal rate and regular  rhythm.     Heart sounds: No murmur heard. Pulmonary:     Effort: Pulmonary effort is normal. No respiratory distress.     Breath sounds: Normal breath sounds. No wheezing.  Musculoskeletal:     Cervical back: Neck supple.  Skin:    General: Skin is warm and dry.     Capillary Refill: Capillary refill takes less than 2 seconds.     Comments: Abdomen - a few scattered excoriations and papules. No erythema  Left arm: scattered healing papules along the upper arm.   Neurological:     Mental Status: She is alert. Mental status is at baseline.  Psychiatric:        Mood and Affect: Mood normal.        Behavior: Behavior normal.          Assessment & Plan:   Problem List Items Addressed This Visit       Cardiovascular and Mediastinum   Benign essential hypertension    Labial bp. Was elevated at [facility] but home monitor with BP ranging from 89-115/47-72 most recently. High in the office today. Discussed trial of holding amlodipine 5  mg to see if this improves the low BP which she thinks is associated with the sleepiness. Advised pcp f/u in 2 weeks with home monitoring and to check in on symptoms.          Respiratory   OSA on CPAP    Endorses strict adherence to CPAP. But still with daytime sleepiness         Musculoskeletal and Integument   Rash - Primary    Suspect dermatitis as does not seem consistent with candida. Trial of triamcinolone.        Relevant Medications   triamcinolone cream (KENALOG) 0.1 %     Other   Daytime sleepiness    Pt endorses falling asleep while driving and in the afternoon while visiting with friends. Endorses adherence to CPAP apart from nighttime urination. Recently saw her OSA specialist who she notes advised she pull over if feeling sleepy. Symptoms concerning for additional diagnosis given CPAP adherence. She sees Dr. Manuella Ghazi with neurology and advised calling his office for additional support. Will also temporarily adjust bp medication to see  if this is the cause.        Other Visit Diagnoses     History of shingles          Hx and rash on arm consistent with resolving shingles. Complete antiviral therapy.    Return in about 2 weeks (around 09/23/2020) for For bp and sleep.  Lesleigh Noe, MD  This visit occurred during the SARS-CoV-2 public health emergency.  Safety protocols were in place, including screening questions prior to the visit, additional usage of staff PPE, and extensive cleaning of exam room while observing appropriate contact time as indicated for disinfecting solutions.

## 2020-09-09 NOTE — Assessment & Plan Note (Signed)
Labial bp. Was elevated at [facility] but home monitor with BP ranging from 89-115/47-72 most recently. High in the office today. Discussed trial of holding amlodipine 5 mg to see if this improves the low BP which she thinks is associated with the sleepiness. Advised pcp f/u in 2 weeks with home monitoring and to check in on symptoms.

## 2020-09-09 NOTE — Assessment & Plan Note (Signed)
Suspect dermatitis as does not seem consistent with candida. Trial of triamcinolone.

## 2020-09-09 NOTE — Assessment & Plan Note (Signed)
Pt endorses falling asleep while driving and in the afternoon while visiting with friends. Endorses adherence to CPAP apart from nighttime urination. Recently saw her OSA specialist who she notes advised she pull over if feeling sleepy. Symptoms concerning for additional diagnosis given CPAP adherence. She sees Dr. Manuella Ghazi with neurology and advised calling his office for additional support. Will also temporarily adjust bp medication to see if this is the cause.

## 2020-09-10 ENCOUNTER — Encounter: Payer: Self-pay | Admitting: Family Medicine

## 2020-09-10 ENCOUNTER — Encounter: Payer: Self-pay | Admitting: *Deleted

## 2020-09-10 ENCOUNTER — Encounter: Payer: Medicare Other | Attending: Family Medicine | Admitting: *Deleted

## 2020-09-10 VITALS — BP 120/68 | Ht <= 58 in | Wt 169.6 lb

## 2020-09-10 DIAGNOSIS — E1165 Type 2 diabetes mellitus with hyperglycemia: Secondary | ICD-10-CM | POA: Diagnosis not present

## 2020-09-10 DIAGNOSIS — E119 Type 2 diabetes mellitus without complications: Secondary | ICD-10-CM

## 2020-09-10 NOTE — Progress Notes (Signed)
Diabetes Self-Management Education  Visit Type: First/Initial  Appt. Start Time: 1105 Appt. End Time: 1240  09/10/2020  [name], identified by name and date of birth, is a 80 y.o. female with a diagnosis of Diabetes: Type 2.   ASSESSMENT  Blood pressure 120/68, height 4' 9"  (1.448 m), weight 169 lb 9.6 oz (76.9 kg). Body mass index is 36.7 kg/m.   Diabetes Self-Management Education - 09/10/20 1406       Visit Information   Visit Type First/Initial      Initial Visit   Diabetes Type Type 2    Are you currently following a meal plan? Yes    What type of meal plan do you follow? "low carb"    Are you taking your medications as prescribed? Yes    Date Diagnosed 3 weeks      Health Coping   How would you rate your overall health? Good      Psychosocial Assessment   Patient Belief/Attitude about Diabetes Motivated to manage diabetes   "It's been a long time coming so I feel relieved that treatment is finally happening"   Self-care barriers None    Self-management support Doctor's office;Friends    Patient Concerns Nutrition/Meal planning;Glycemic Control;Monitoring;Weight Control;Healthy Lifestyle    Special Needs None    Preferred Learning Style Auditory;Visual;Hands on    [place] in progress    How often do you need to have someone help you when you read instructions, pamphlets, or other written materials from your doctor or pharmacy? 1 - Never    What is the last grade level you completed in school? [employer]      Pre-Education Assessment   Patient understands the diabetes disease and treatment process. Needs Instruction    Patient understands incorporating nutritional management into lifestyle. Needs Instruction    Patient undertands incorporating physical activity into lifestyle. Needs Instruction    Patient understands using medications safely. Needs Instruction    Patient understands monitoring blood glucose, interpreting and using results  Needs Instruction    Patient understands prevention, detection, and treatment of acute complications. Needs Instruction    Patient understands prevention, detection, and treatment of chronic complications. Needs Instruction    Patient understands how to develop strategies to address psychosocial issues. Needs Instruction    Patient understands how to develop strategies to promote health/change behavior. Needs Instruction      Complications   Last HgB A1C per patient/outside source 6.7 %   08/10/2020   How often do you check your blood sugar? 0 times/day (not testing)   Provided One Touch Verio Reflect and instructed on use. BG upon return demonstration was 148 mg/dL at 12:25 pm - 2 hrs pp.   Number of hypoglycemic episodes per month 1  Pt reports symptoms but no glucometer to check   Can you tell when you blood sugar is low? Yes   What do you do if you blood sugar is low? "eat something"   Have you had a dilated eye exam in the past 12 months? No    Have you had a dental exam in the past 12 months? Yes    Are you checking your feet? Yes    How many days per week are you checking your feet? 3      Dietary Intake   Breakfast oatmeal and 1/2 banana; quiche; eggs and bacon; chicken with tomato, mayo on sandwich thin    Lunch salads with chicken; chicken on sandwich thin  Snack (afternoon) 4:00 pm - fruit (grapes, cheeries, apples, bananas, strawberries, blueberries)    Dinner chicken, meat loaf, pork    Snack (evening) fruit at 8:00 pm and apple at 11:00 pm    Beverage(s) water, coffee, Crystal light      Exercise   Exercise Type ADL's      Patient Education   Previous Diabetes Education No    Disease state  Definition of diabetes, type 1 and 2, and the diagnosis of diabetes;Factors that contribute to the development of diabetes    Nutrition management  Role of diet in the treatment of diabetes and the relationship between the three main macronutrients and blood glucose level;Food label  reading, portion sizes and measuring food.;Carbohydrate counting;Reviewed blood glucose goals for pre and post meals and how to evaluate the patients' food intake on their blood glucose level.    Physical activity and exercise  Role of exercise on diabetes management, blood pressure control and cardiac health.    Monitoring Taught/evaluated SMBG meter.;Purpose and frequency of SMBG.;Taught/discussed recording of test results and interpretation of SMBG.;Identified appropriate SMBG and/or A1C goals.    Acute complications Taught treatment of hypoglycemia - the 15 rule.    Chronic complications Relationship between chronic complications and blood glucose control;Retinopathy and reason for yearly dilated eye exams    Psychosocial adjustment Identified and addressed patients feelings and concerns about diabetes      Individualized Goals (developed by patient)   Reducing Risk Other (comment)   improve blood sugars, prevent diabetes complications, lose weight, lead a healthier lifestyle, become more fit     Outcomes   Expected Outcomes Demonstrated interest in learning. Expect positive outcomes    Future DMSE 4-6 wks             Individualized Plan for Diabetes Self-Management Training:   Learning Objective:  Patient will have a greater understanding of diabetes self-management. Patient education plan is to attend individual and/or group sessions per assessed needs and concerns.   Plan:   Patient Instructions  Check blood sugars before breakfast and 2 hours after one meal 3 x week or as needed for low blood sugar symptoms Bring blood sugar records to the next class  Call your doctor for a prescription for:   1. Meter strips (type)   One Touch Vero  checking  3 times per week  2. Lancets (type)  One Touch Delica Plus checking  3     times per week  Exercise:  Walk as tolerated until knee surgery  Eat 3 meals day,   2-3  snacks a day Space meals 4-6 hours apart Eat 1 serving of protein  when eating fruit for a snack  Make an eye doctor appointment   Carry fast acting glucose and a snack at all times  Expected Outcomes:  Demonstrated interest in learning. Expect positive outcomes  Education material provided:  General Meal Planning Guidelines Simple Meal Plan Meter = One Touch Verio Reflect Glucose tablets Symptoms, causes and treatments of Hypoglycemia   If problems or questions, patient to contact team via:   Johny Drilling, [place], [place] [phone]  Future DSME appointment: 4-6 wks October 07, 2020 for Diabetes Class 1

## 2020-09-10 NOTE — Patient Instructions (Signed)
Check blood sugars before breakfast and 2 hours after one meal 3 x week or as needed for low blood sugar symptoms Bring blood sugar records to the next class  Call your doctor for a prescription for:   1. Meter strips (type)   One Touch Vero  checking  3 times per week  2. Lancets (type)  One Touch Delica Plus checking  3     times per week  Exercise:  Walk as tolerated until knee surgery  Eat 3 meals day,   2-3  snacks a day Space meals 4-6 hours apart Eat 1 serving of protein when eating fruit for a snack  Make an eye doctor appointment   Carry fast acting glucose and a snack at all times  Return for classes on:

## 2020-09-14 ENCOUNTER — Other Ambulatory Visit: Payer: Self-pay

## 2020-09-14 ENCOUNTER — Ambulatory Visit: Payer: Medicare Other

## 2020-09-14 VITALS — BP 140/63 | HR 70

## 2020-09-14 DIAGNOSIS — M25562 Pain in left knee: Secondary | ICD-10-CM | POA: Diagnosis not present

## 2020-09-14 DIAGNOSIS — R278 Other lack of coordination: Secondary | ICD-10-CM

## 2020-09-14 DIAGNOSIS — M6281 Muscle weakness (generalized): Secondary | ICD-10-CM

## 2020-09-14 DIAGNOSIS — M25551 Pain in right hip: Secondary | ICD-10-CM

## 2020-09-14 DIAGNOSIS — R2689 Other abnormalities of gait and mobility: Secondary | ICD-10-CM | POA: Diagnosis not present

## 2020-09-14 DIAGNOSIS — G8929 Other chronic pain: Secondary | ICD-10-CM | POA: Diagnosis not present

## 2020-09-14 MED ORDER — ONETOUCH VERIO W/DEVICE KIT: PACK | 0 refills | Status: DC

## 2020-09-14 MED ORDER — ONETOUCH VERIO VI STRP: ORAL_STRIP | 3 refills | Status: DC

## 2020-09-14 MED ORDER — ONETOUCH DELICA PLUS LANCET33G MISC: 3 refills | Status: DC

## 2020-09-14 NOTE — Therapy (Signed)
[place] MAIN [facility] SERVICES [address] [place], [place], [address] Phone: [phone]   Fax:  [phone]  Physical Therapy Treatment  Patient Details  Name: Erin Petersen MRN: [id number] Date of Birth: [birth year] Referring Provider (PT): Benjaman Kindler, MD   Encounter Date: 09/14/2020   PT End of Session - 09/15/20 0828     Visit Number 2    Number of Visits 10    Date for PT Re-Evaluation 11/07/20    Authorization Type BCBS Medicare-no auth required    Progress Note Due on Visit 10    PT Start Time 0804    PT Stop Time 0901    PT Time Calculation (min) 57 min    Activity Tolerance Patient tolerated treatment well    Behavior During Therapy [facility] for tasks assessed/performed             Past Medical History:  Diagnosis Date   Arthritis    Asthma    exercise induced asthma   Bacteremia due to Gram-negative bacteria 06/04/2019   Colon polyps    Diabetes mellitus without complication (HCC)    Duodenal ulcer    Dyspnea    GERD (gastroesophageal reflux disease)    Hyperlipidemia    Hypertension    Neuropathy    right leg and foot   Plantar fasciitis    Pre-diabetes    Prediabetes    RLQ abdominal pain    RLS (restless legs syndrome)    Sleep apnea    CPAP   SOB (shortness of breath)    Urinary incontinence    Urinary tract infection    Wears hearing aid in right ear     Past Surgical History:  Procedure Laterality Date   [place]     lumbar fusion   BILATERAL SALPINGOOPHORECTOMY  01/07/2018   BIOPSY  04/15/2019   Procedure: BIOPSY;  Surgeon: [name], MD;  Location: [place];  Service: Endoscopy;;   CARDIAC CATHETERIZATION  2019   COLONOSCOPY WITH PROPOFOL N/A 11/11/2018   Procedure: COLONOSCOPY WITH PROPOFOL;  Surgeon: [name], MD;  Location: [facility];  Service: Gastroenterology;  Laterality: N/A;   CYSTO WITH  HYDRODISTENSION N/A 12/30/2018   Procedure: CYSTOSCOPY/HYDRODISTENSION WITH MARCAINE;  Surgeon: Bjorn Loser, MD;  Location: [facility];  Service: Urology;  Laterality: N/A;   ESOPHAGOGASTRODUODENOSCOPY (EGD) WITH PROPOFOL N/A 04/15/2019   Procedure: ESOPHAGOGASTRODUODENOSCOPY (EGD) WITH PROPOFOL;  Surgeon: [name], MD;  Location: [place];  Service: Endoscopy;  Laterality: N/A;  sleep apnea   ESOPHAGOGASTRODUODENOSCOPY (EGD) WITH PROPOFOL N/A 07/09/2019   Procedure: ESOPHAGOGASTRODUODENOSCOPY (EGD) WITH PROPOFOL;  Surgeon: [name], MD;  Location: [place];  Service: Gastroenterology;  Laterality: N/A;   EYE SURGERY Bilateral    HERNIA REPAIR     abd   HERNIA REPAIR     umbilical   INGUINAL HERNIA REPAIR Bilateral    inguinal   LAPAROSCOPIC BILATERAL SALPINGO OOPHERECTOMY     SPINAL FUSION  2002   TONSILLECTOMY AND ADENOIDECTOMY  1961   TOTAL KNEE ARTHROPLASTY Right 01/01/2020   Procedure: Right total knee arthroplasty - [name] to Assist;  Surgeon: Hessie Knows, MD;  Location: [facility];  Service: Orthopedics;  Laterality: Right;   TUBAL LIGATION      Vitals:   09/14/20 0809  BP: 140/63  Pulse: 70     Subjective Assessment - 09/14/20 0807  Subjective Pt reported she had an appt with the nurse re: fluctuating BP and was told to stop Norvasc and take BP daily and make f/u in two weeks-ceased BP meds on 7/15 and BP has been fine. Pt reported she felt bad went she woke up yesterday morning (blurred vision, nausea) and checked blood glucose (which was fine, she was recently dx'd with DM). It lasted approx. 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    Pertinent History HTN, migraine, OSA on CPAP, asthma, IBS, GERD, HLD, neuropathy, osteoporosis, OA, L TKA scheduled for 10/12/20. hx of R TKA, hx of Lumbar spine fusion, IC, chronic bladder pain, RLS, B tinnitus, Vit D deficiency, neuralgia of B pudenal nerves,  hx of skin CA, hx of hysterectomy, dizziness, pt has hearing aides in B ears, colon polyps, duodenal ulcer, plantar fasciitis, hx of UTIs    Patient Stated Goals "Get rid of the vaginal tightness and improve IC."    Currently in Pain? No/denies               Pelvic Floor Physical Therapy Evaluation and Assessment    OBJECTIVE    Palpation/Segmental Motion/Joint Play: TTP at R hips, with pt reporting fibromyalgia pain similar.     Range of Motion/Flexibilty:  Spine: limited Lx spine ext 2/2 pain, from "catching back" after being at pool on Sunday. Hips: decr. B hip IR with pain reported during ER and IR at hips with PA pressure into B SI joints.   Strength/MMT:  LE MMT  LE MMT Left Right  Hip flex:  (L2) /5 /5  Hip ext: /5 /5  Hip abd: 3/5 3/5  Hip add: /5 /5  Hip IR /5 /5  Hip ER /5 /5     Abdominal:  Palpation: no TTP Diastasis: 1.5cm distal to xyphoid, pt with hx of DR repair with mesh  Pelvic Floor External Exam:Introitus Appears:  Skin integrity:  Palpation: Cough: Prolapse visible?: Scar mobility: Through clothing: Ischial tuberosities: not TTP Palpation for pelvic floor contraction: Coccyx: TTP    INTERVENTIONS THIS SESSION: NMR: Access Code: [email or web address] URL: [email or web address] Date: 09/14/2020 Prepared by: Geoffry Paradise  Exercises Supine Diaphragmatic Breathing with Pelvic Floor Lengthening - 3-4 x daily - 7 x weekly - 1 sets - 5 reps Supine Hip Adductor Stretch - 2-3 x daily - 7 x weekly - 1 sets - 3 reps - 30-45 hold   Cues and demo for proper technique.             Pelvic Floor Special Questions - 09/14/20 1219     Pelvic Floor Internal Exam Yes    Exam Type Vaginal    Sensation Intact    Palpation TTP B levator ani, obturator internus with pt reporting 6/10 pain on right side and 3-4/10 pain on left side. PT palpated what appeared to be mesh on R side for bladder.    Strength Flicker    Strength # of  reps 3    Strength # of seconds 1    Tone Incr. tone               [facility] Adult PT Treatment/Exercise - 09/14/20 1221       Manual Therapy   Manual Therapy Internal Pelvic Floor    Manual therapy comments Pt reported pain with palpation and during trigger point release but tolerable.    Internal Pelvic Floor Trigger point release to R and L levator ani and obturator internus.  Pt reported 3-4/10 pain with palpation of L side of pelvic floor and 6/10 pain on R side.         PT Education - 09/15/20 5400     Education Details PT educated pt on internal pelvic floor muscle assessment findings. PT educated pt on HEP and LLD findings, along with POC. PT reiterated the importance of informing MD re: blurry vision, nausea as pt recently diagnosed with DM.    Person(s) Educated Patient    Methods Explanation;Demonstration;Tactile cues;Verbal cues;Handout    Comprehension Returned demonstration;Verbalized understanding;Need further instruction              PT Short Term Goals - 09/15/20 8676       PT SHORT TERM GOAL #1   Title Pt will be IND in HEP to improve leakage, strength, posture, and balance. TARGET DATE FOR ALL STGS: 10/06/20    Time 4    Period Weeks    Status New      PT SHORT TERM GOAL #2   Title Pt will report ability to stand for >/= 10 minutes without incr. in pain in order to meal prep.    Time 4    Period Weeks    Status New      PT SHORT TERM GOAL #3   Title Perform pelvic muscle assessment and write goals.    Time 4    Period Weeks    Status Achieved      PT SHORT TERM GOAL #4   Title Pt will verbalize proper toileting posture to fully empty bladder and not strain during bowel movements.    Time 4    Period Weeks    Status New               PT Long Term Goals - 09/15/20 0834       PT LONG TERM GOAL #1   Title Pt will report improve of bowel constipation on FOTO to 59 points and improve urinary issues score to 60 points  in order to improve QOL.    Time 10    Period Weeks    Status New      PT LONG TERM GOAL #2   Title Pt will report getting up </=1 time per night to void in order to improve sleep and QOL.    Baseline 3-4 times.    Time 10    Period Weeks    Status New      PT LONG TERM GOAL #3   Title Pt will be able to perform pelvic floor contraction, relaxation and bulge in order to improve strength and relaxation of pelvic floor.    Time 10    Period Weeks    Status New      PT LONG TERM GOAL #4   Title Pt will demo decreased abdominal scar adhesions to progress to deep core exercises and improve motility    Baseline pt states she has not been performing this.    Time 10    Period Weeks    Status New      PT LONG TERM GOAL #5   Title Pt will report type 3 or 4 bowel movments on [place] Stool Chart 75% of the time to reduce strain.    Time 10    Period Weeks    Status New      Additional Long Term Goals   Additional Long Term Goals Yes      PT LONG TERM GOAL #6  Title Pt will report </=3/10 during speculum insertion in order to improve QOL.    Baseline 6/10    Time 10    Period Weeks    Status New                   Plan - 09/15/20 9628     Clinical Impression Statement PT performed internal pelvic floor muscle assessment today and found pt's pelvic floor muscle hypertonic overall and very TTP. Therefore, today's skilled session focused on relaxation of pelvic floor during trigger point release. [name] pelvic floor muscle strength 1/5 with pt unable to hold contraction or lift, likley due to incr. tension and inability for muscles to contract effectively. PT will not have pt perform contractions at this time but will focus on manual therapy (internally and externally) to reduce tension and alignment, in order to allow pt to correctly contraction pelvic floor muscles. [name] LLE found to be longer than RLE, will trial lift in shoe next visit after manual therapy. Pt would continue  to benefit from skilled PT to imrpove pain, strength, balance and ROM during all ADLs.    PT Frequency 1x / week    PT Duration Other (comment)   10 weeks   PT Treatment/Interventions ADLs/Self Care Home Management;Biofeedback;Neuromuscular re-education;Balance training;Therapeutic exercise;Therapeutic activities;Functional mobility training;Gait training;Patient/family education;Manual techniques;Dry needling    PT Next Visit Plan internal pelvic muscle manual therapy, manual therapy (hips) as needed, trial lift in R shoe for LLD, review HEP for relaxation and B hip/LE strength. Balance training.    Consulted and Agree with Plan of Care Patient             Patient will benefit from skilled therapeutic intervention in order to improve the following deficits and impairments:  Abnormal gait, Decreased coordination, Decreased range of motion, Pain, Decreased balance, Hypomobility, Impaired flexibility, Decreased mobility, Decreased strength, Postural dysfunction  Visit Diagnosis: Other abnormalities of gait and mobility  Other lack of coordination  Muscle weakness (generalized)  Pain in right hip     Problem List Patient Active Problem List   Diagnosis Date Noted   Daytime sleepiness 09/09/2020   Rash 09/09/2020   Medicare annual wellness visit, subsequent 08/25/2020   Encounter for screening mammogram for breast cancer 08/25/2020   Encounter for general adult medical examination with abnormal findings 08/25/2020   GERD (gastroesophageal reflux disease) 08/25/2020   Dizzy spells 08/25/2020   Migraine 03/12/2020   IBS (irritable bowel syndrome) 03/12/2020   S/P TKR (total knee replacement) using cement, right 01/01/2020   History of vaginal hysterectomy 06/11/2019   DM type 2 (diabetes mellitus, type 2) ([place]) 04/22/2019   Estrogen deficiency 04/22/2019   History of skin cancer 04/22/2019   High-tone pelvic floor dysfunction 04/03/2019   IC (interstitial cystitis) 04/03/2019    Neuralgia of both pudendal nerves 04/03/2019   Pelvic pain in female 04/03/2019   Osteoporosis 02/12/2019   Asthma 01/31/2019   Adverse reaction to influenza vaccine, initial encounter 12/03/2018   Vitamin D deficiency 12/03/2018   Restless legs syndrome 11/28/2018   Small fiber neuropathy 11/28/2018   Allergic rhinitis 10/08/2018   Benign essential hypertension 10/08/2018   Chronic bladder pain 10/08/2018   Neuropathy 10/08/2018   Osteoarthritis 10/08/2018   S/P BSO (bilateral salpingo-oophorectomy) 01/07/2018   OSA on CPAP 12/29/2016   Other and unspecified disc disorder of lumbar region 10/25/2000   DM type 2 with diabetic mixed hyperlipidemia ([place]) 09/24/1998   Tinnitus of both ears 09/24/1983  Dezi Schaner L 09/15/2020, 8:35 AM  [place] MAIN [facility] SERVICES [address] [place], [place], [address] Phone: [phone]   Fax:  [phone]  Name: Erin Petersen MRN: [id number] Date of Birth: 1940/10/18   Geoffry Paradise, PT,DPT 09/15/20 8:35 AM Phone: [phone] Fax: [phone]

## 2020-09-15 ENCOUNTER — Telehealth: Payer: Self-pay

## 2020-09-15 NOTE — Telephone Encounter (Signed)
Erin Petersen got my chart appt scheduled by pt thru mychart for 09/21/20 at 8 AM with Dr Glori Bickers with notation of BP fluctuating between 179/76 - 89/54. I spoke with pt and pt was seen by Dr Einar Pheasant on 09/09/20 with these symptoms; pt was advised to hold amlodipine 5 mg for time beng and schedule FU appt with Dr Glori Bickers in 2 wks. Now BP 137/71 P 85; BP last night was 150/80. No H/A,dizziness, CP vision changes and pt does have SOB and SOB now on and off is slightly worse than usual; pt says SOB does not stop her from doing whatever she wants. No distress and pt said does not need to go to [facility] or ED. Pt said lowest BP reading since stopping amlodipine was last night at BP 109/64. UC & ED precautions given and pt voiced understanding. Pt plans to keep appt with Dr Glori Bickers on 09/21/20 at 8 AM. Sending note to Dr Glori Bickers and shapale CMA.

## 2020-09-15 NOTE — Telephone Encounter (Signed)
Aware and agree with advisement, I will see her then

## 2020-09-17 ENCOUNTER — Other Ambulatory Visit: Payer: Self-pay | Admitting: Gastroenterology

## 2020-09-20 ENCOUNTER — Ambulatory Visit
Admission: RE | Admit: 2020-09-20 | Discharge: 2020-09-20 | Disposition: A | Discharge status: Home / Self Care | Payer: Medicare Other | Source: Ambulatory Visit | Attending: Family Medicine | Admitting: Family Medicine

## 2020-09-20 ENCOUNTER — Other Ambulatory Visit: Payer: Self-pay

## 2020-09-20 DIAGNOSIS — Z1231 Encounter for screening mammogram for malignant neoplasm of breast: Secondary | ICD-10-CM | POA: Diagnosis not present

## 2020-09-21 ENCOUNTER — Encounter: Payer: Self-pay | Admitting: Family Medicine

## 2020-09-21 ENCOUNTER — Ambulatory Visit (INDEPENDENT_AMBULATORY_CARE_PROVIDER_SITE_OTHER): Payer: Medicare Other | Admitting: Family Medicine

## 2020-09-21 VITALS — BP 160/81 | HR 71 | Temp 98.0°F | Ht 58.25 in | Wt 170.1 lb

## 2020-09-21 DIAGNOSIS — I10 Essential (primary) hypertension: Secondary | ICD-10-CM

## 2020-09-21 NOTE — Progress Notes (Signed)
Subjective:    Patient ID: Erin Petersen, female    DOB: 01/10/1941, 80 y.o.   MRN: [id number]  This visit occurred during the SARS-CoV-2 public health emergency.  Safety protocols were in place, including screening questions prior to the visit, additional usage of staff PPE, and extensive cleaning of exam room while observing appropriate contact time as indicated for disinfecting solutions.   HPI Pt presents to discuss labile bp  Wt Readings from Last 3 Encounters:  09/21/20 170 lb 2 oz (77.2 kg)  09/10/20 169 lb 9.6 oz (76.9 kg)  09/09/20 169 lb 8 oz (76.9 kg)   35.25 kg/m   BP Readings from Last 3 Encounters:  09/21/20 (!) 160/81  09/14/20 140/63  09/10/20 120/68     Pulse Readings from Last 3 Encounters:  09/21/20 71  09/14/20 70  09/09/20 75   She saw Dr Einar Pheasant on 09/09/20 Noted labile bp and noted at times high and as low at home as 89-115/47-72  Was high in office that day Disc trial to hold amlodipine 5 mg   She also takes hctz 25 mg daily -takes in am   Unsure if her cuff is accurate  She ordered a new one- unsure when it will get here/ through medicare   Day time sleepiness is better off the amlodipine   ? If low blood sugar  ? If low blood pressure  109/64, 112/67  Gets light headed when it is low  Some highs 169/82 highest  150s sometimes   128/80 this am -good   Seeing nutritionist  Craves sweets terribly   Not a lot of stress    Lab Results  Component Value Date   CREATININE 1.14 08/10/2020   BUN 26 (H) 08/10/2020   NA 138 08/10/2020   K 4.3 08/10/2020   CL 100 08/10/2020   CO2 30 08/10/2020      Last echocardiogram was 06/04/19: EF 55-60% L atrial size mildly dilated No valve abn Mildly elevated R sided pressures (sees cardiology)   Has OSA on cpap Hyperlipidemia with simvastatin  Past TIA ? MRI march 2022 - some age related micro vasc changes Sees Dr Ubaldo Glassing Has f/u in [place] her cardiology note from march  today  Patient Active Problem List   Diagnosis Date Noted   Daytime sleepiness 09/09/2020   Rash 09/09/2020   Medicare annual wellness visit, subsequent 08/25/2020   Encounter for screening mammogram for breast cancer 08/25/2020   Encounter for general adult medical examination with abnormal findings 08/25/2020   GERD (gastroesophageal reflux disease) 08/25/2020   Dizzy spells 08/25/2020   Migraine 03/12/2020   IBS (irritable bowel syndrome) 03/12/2020   S/P TKR (total knee replacement) using cement, right 01/01/2020   History of vaginal hysterectomy 06/11/2019   DM type 2 (diabetes mellitus, type 2) ([place]) 04/22/2019   Estrogen deficiency 04/22/2019   History of skin cancer 04/22/2019   High-tone pelvic floor dysfunction 04/03/2019   IC (interstitial cystitis) 04/03/2019   Neuralgia of both pudendal nerves 04/03/2019   Pelvic pain in female 04/03/2019   Osteoporosis 02/12/2019   Asthma 01/31/2019   Adverse reaction to influenza vaccine, initial encounter 12/03/2018   Vitamin D deficiency 12/03/2018   Restless legs syndrome 11/28/2018   Small fiber neuropathy 11/28/2018   Allergic rhinitis 10/08/2018   Benign essential hypertension 10/08/2018   Chronic bladder pain 10/08/2018   Neuropathy 10/08/2018   Osteoarthritis 10/08/2018   S/P BSO (bilateral salpingo-oophorectomy) 01/07/2018   OSA on CPAP  12/29/2016   Other and unspecified disc disorder of lumbar region 10/25/2000   DM type 2 with diabetic mixed hyperlipidemia ([place]) 09/24/1998   Tinnitus of both ears 09/24/1983   Past Medical History:  Diagnosis Date   Arthritis    Asthma    exercise induced asthma   Bacteremia due to Gram-negative bacteria 06/04/2019   Colon polyps    Diabetes mellitus without complication (HCC)    Duodenal ulcer    Dyspnea    GERD (gastroesophageal reflux disease)    Hyperlipidemia    Hypertension    Neuropathy    right leg and foot   Plantar fasciitis    Pre-diabetes    Prediabetes     RLQ abdominal pain    RLS (restless legs syndrome)    Sleep apnea    CPAP   SOB (shortness of breath)    Urinary incontinence    Urinary tract infection    Wears hearing aid in right ear    Past Surgical History:  Procedure Laterality Date   [place]     lumbar fusion   BILATERAL SALPINGOOPHORECTOMY  01/07/2018   BIOPSY  04/15/2019   Procedure: BIOPSY;  Surgeon: [name], MD;  Location: [facility];  Service: Endoscopy;;   CARDIAC CATHETERIZATION  2019   COLONOSCOPY WITH PROPOFOL N/A 11/11/2018   Procedure: COLONOSCOPY WITH PROPOFOL;  Surgeon: [name], MD;  Location: [facility];  Service: Gastroenterology;  Laterality: N/A;   CYSTO WITH HYDRODISTENSION N/A 12/30/2018   Procedure: CYSTOSCOPY/HYDRODISTENSION WITH MARCAINE;  Surgeon: Bjorn Loser, MD;  Location: [facility];  Service: Urology;  Laterality: N/A;   ESOPHAGOGASTRODUODENOSCOPY (EGD) WITH PROPOFOL N/A 04/15/2019   Procedure: ESOPHAGOGASTRODUODENOSCOPY (EGD) WITH PROPOFOL;  Surgeon: [name], MD;  Location: [facility];  Service: Endoscopy;  Laterality: N/A;  sleep apnea   ESOPHAGOGASTRODUODENOSCOPY (EGD) WITH PROPOFOL N/A 07/09/2019   Procedure: ESOPHAGOGASTRODUODENOSCOPY (EGD) WITH PROPOFOL;  Surgeon: [name], MD;  Location: [facility];  Service: Gastroenterology;  Laterality: N/A;   EYE SURGERY Bilateral    HERNIA REPAIR     abd   HERNIA REPAIR     umbilical   INGUINAL HERNIA REPAIR Bilateral    inguinal   LAPAROSCOPIC BILATERAL SALPINGO OOPHERECTOMY     SPINAL FUSION  2002   TONSILLECTOMY AND ADENOIDECTOMY  1961   TOTAL KNEE ARTHROPLASTY Right 01/01/2020   Procedure: Right total knee arthroplasty - [name] to Assist;  Surgeon: Hessie Knows, MD;  Location: [facility];  Service: Orthopedics;  Laterality: Right;   TUBAL LIGATION     Social History   Tobacco Use   Smoking status: Never    Smokeless tobacco: Never  Vaping Use   Vaping Use: Never used  Substance Use Topics   Alcohol use: Not Currently   Drug use: Never   Family History  Problem Relation Age of Onset   Alcohol abuse Mother    Cancer Mother    Stroke Mother    Alcohol abuse Father    Arthritis Sister    Arthritis Daughter    COPD Daughter    Arthritis Paternal Grandmother    Asthma Paternal Grandmother    COPD Paternal Grandmother    Breast cancer Paternal Aunt    Allergies  Allergen Reactions   Bee Venom Anaphylaxis and Hives   Other Anaphylaxis    Bee Sting    Fosamax [Alendronate]     Nausea  Ciprofloxacin Hcl Rash    Mild rash after long term use   Sulfa Antibiotics Rash    Bactrim   Current Outpatient Medications on File Prior to Visit  Medication Sig Dispense Refill   acetaminophen (TYLENOL) 650 MG CR tablet Take 1,300 mg by mouth every 8 (eight) hours as needed for pain.      albuterol (VENTOLIN HFA) 108 (90 Base) MCG/ACT inhaler Inhale 2 puffs into the lungs every 6 (six) hours as needed for wheezing or shortness of breath.     amitriptyline (ELAVIL) 25 MG tablet Take 1 tablet (25 mg total) by mouth at bedtime. 90 tablet 3   amLODipine (NORVASC) 5 MG tablet Take 1 tablet (5 mg total) by mouth daily. 90 tablet 3   aspirin 81 MG EC tablet Take 81 mg by mouth at bedtime.      Blood Glucose Monitoring Suppl (ONETOUCH VERIO) w/Device KIT To check glucose daily and prn for DM2 1 kit 0   cephALEXin (KEFLEX) 500 MG capsule Take 4 pills one hour prior to dental appointment.     Cholecalciferol (VITAMIN D3) 50 MCG (2000 UT) TABS Take 4,000 Units by mouth daily.     Cyanocobalamin (VITAMIN B-12) 3000 MCG SUBL Take 3,000 mcg by mouth daily.     docusate sodium (COLACE) 100 MG capsule Take 1 capsule (100 mg total) by mouth 2 (two) times daily. 10 capsule 0   EPINEPHrine 0.3 mg/0.3 mL IJ SOAJ injection Inject 0.3 mg into the muscle as needed for anaphylaxis. 1 each 1   estradiol (ESTRACE) 0.1  MG/GM vaginal cream Place 1 Applicatorful vaginally at bedtime.     fluticasone (FLONASE) 50 MCG/ACT nasal spray Place 2 sprays into both nostrils daily. 16 g 3   glucose blood (ONETOUCH VERIO) test strip To check glucose daily and prn for DM2 100 each 3   hydrochlorothiazide (HYDRODIURIL) 25 MG tablet Take 1 tablet (25 mg total) by mouth daily. 90 tablet 3   hydrOXYzine (ATARAX/VISTARIL) 25 MG tablet Take 1 tablet (25 mg total) by mouth at bedtime. 90 tablet 3   Lancets (ONETOUCH DELICA PLUS EFEOFH21F) MISC To check glucose daily and prn for DM2 100 each 3   LINZESS 145 MCG CAPS capsule TAKE 1 CAPSULE DAILY BEFORE BREAKFAST 90 capsule 3   Melatonin 10 MG TABS Take 10 mg by mouth at bedtime.     meloxicam (MOBIC) 15 MG tablet Take 1 tablet (15 mg total) by mouth daily. Take with food 90 tablet 3   omeprazole (PRILOSEC) 40 MG capsule Take 40 mg by mouth daily.     ondansetron (ZOFRAN) 4 MG tablet Take 1 tablet (4 mg total) by mouth every 8 (eight) hours as needed for nausea or vomiting. 20 tablet 0   polyethylene glycol (MIRALAX / GLYCOLAX) 17 g packet Take 1 packet by mouth daily.      pregabalin (LYRICA) 150 MG capsule Take 1 capsule (150 mg total) by mouth 2 (two) times daily. (Patient taking differently: Take 150 mg by mouth. Take one tablet in am and 2 tabs in afternoon) 20 capsule 0   rOPINIRole (REQUIP) 1 MG tablet TAKE 1 TABLET IN THE MORNING AND 1 TABLET IN THE AFTERNOON 180 tablet 2   rOPINIRole (REQUIP) 4 MG tablet Take 1 tablet (4 mg total) by mouth at bedtime. 90 tablet 3   simvastatin (ZOCOR) 20 MG tablet Take 1 tablet (20 mg total) by mouth at bedtime. 90 tablet 3   traMADol (ULTRAM) 50 MG tablet  Take 1 tablet (50 mg total) by mouth every 6 (six) hours as needed. 30 tablet 0   triamcinolone cream (KENALOG) 0.1 % Apply 1 application topically 2 (two) times daily. 30 g 0   No current facility-administered medications on file prior to visit.    Review of Systems  Constitutional:   Negative for activity change, appetite change, fatigue, fever and unexpected weight change.  HENT:  Negative for congestion, ear pain, rhinorrhea, sinus pressure and sore throat.   Eyes:  Negative for pain, redness and visual disturbance.  Respiratory:  Negative for cough, shortness of breath and wheezing.   Cardiovascular:  Negative for chest pain, palpitations and leg swelling.  Gastrointestinal:  Negative for abdominal pain, blood in stool, constipation and diarrhea.  Endocrine: Negative for polydipsia and polyuria.  Genitourinary:  Negative for dysuria, frequency and urgency.  Musculoskeletal:  Negative for arthralgias, back pain and myalgias.  Skin:  Negative for pallor and rash.  Allergic/Immunologic: Negative for environmental allergies.  Neurological:  Negative for dizziness, syncope and headaches.       Was light headed and tired on amlodipine This is improved  Hematological:  Negative for adenopathy. Does not bruise/bleed easily.  Psychiatric/Behavioral:  Negative for decreased concentration and dysphoric mood. The patient is not nervous/anxious.        Stressed      Objective:   Physical Exam Constitutional:      General: She is not in acute distress.    Appearance: Normal appearance. She is well-developed. She is obese. She is not ill-appearing or diaphoretic.  HENT:     Head: Normocephalic and atraumatic.  Eyes:     Conjunctiva/sclera: Conjunctivae normal.     Pupils: Pupils are equal, round, and reactive to light.  Neck:     Thyroid: No thyromegaly.     Vascular: No carotid bruit or JVD.  Cardiovascular:     Rate and Rhythm: Normal rate and regular rhythm.     Heart sounds: Normal heart sounds.    No gallop.  Pulmonary:     Effort: Pulmonary effort is normal. No respiratory distress.     Breath sounds: Normal breath sounds. No wheezing or rales.  Abdominal:     General: Bowel sounds are normal. There is no distension or abdominal bruit.     Palpations: Abdomen  is soft. There is no mass.     Tenderness: There is no abdominal tenderness.  Musculoskeletal:     Cervical back: Normal range of motion and neck supple.     Right lower leg: No edema.     Left lower leg: No edema.  Lymphadenopathy:     Cervical: No cervical adenopathy.  Skin:    General: Skin is warm and dry.     Coloration: Skin is not pale.     Findings: No rash.  Neurological:     Mental Status: She is alert.     Coordination: Coordination normal.     Deep Tendon Reflexes: Reflexes are normal and symmetric. Reflexes normal.  Psychiatric:        Mood and Affect: Mood normal.          Assessment & Plan:   Problem List Items Addressed This Visit       Cardiovascular and Mediastinum   Benign essential hypertension - Primary    Now off amlodipine for hypotension  bp is labile  BP: (!) 160/81 disc that low bp is more worrisome than high  Seldom symptomatic now off amlodipine  Rev  note from visit with Dr Einar Pheasant Continues hctz  inst to get a new bp cuff  Given info re: proper way to take it  Also DASH eating  Some stress playing into this aw well (calm today) OSA treated appropriately with cpap inst to check bp with new cuff and f/u if possible before her ortho surgery  Last echo reviewed

## 2020-09-21 NOTE — Patient Instructions (Addendum)
If you can afford it- get OMRON cuff for the arm size regular   Take BP sitting/relaxed and arm at heart level if possible  Watch your sodium intake  Avoid processed food (careful when eating out)  Keep exercising in the pool   Take your medicine today  Check your BP several hours later  Then with new cuff get [place] some readings and add notation re: how you feel   Keep up good fluids

## 2020-09-22 ENCOUNTER — Other Ambulatory Visit: Payer: Self-pay

## 2020-09-22 ENCOUNTER — Ambulatory Visit: Payer: Medicare Other

## 2020-09-22 VITALS — BP 157/71 | HR 66

## 2020-09-22 DIAGNOSIS — R2689 Other abnormalities of gait and mobility: Secondary | ICD-10-CM | POA: Diagnosis not present

## 2020-09-22 DIAGNOSIS — M25551 Pain in right hip: Secondary | ICD-10-CM | POA: Diagnosis not present

## 2020-09-22 DIAGNOSIS — G8929 Other chronic pain: Secondary | ICD-10-CM | POA: Diagnosis not present

## 2020-09-22 DIAGNOSIS — M25562 Pain in left knee: Secondary | ICD-10-CM | POA: Diagnosis not present

## 2020-09-22 DIAGNOSIS — R278 Other lack of coordination: Secondary | ICD-10-CM

## 2020-09-22 DIAGNOSIS — M6281 Muscle weakness (generalized): Secondary | ICD-10-CM | POA: Diagnosis not present

## 2020-09-22 NOTE — Therapy (Signed)
[place] MAIN [facility] SERVICES [address] [place], [place], [address] Phone: [phone]   Fax:  [phone]  Physical Therapy Treatment  Patient Details  Name: Erin Petersen MRN: [id number] Date of Birth: [birth year] Referring Provider (PT): Benjaman Kindler, MD   Encounter Date: 09/22/2020   PT End of Session - 09/22/20 1236     Visit Number 3    Number of Visits 10    Date for PT Re-Evaluation 11/07/20    Authorization Type BCBS Medicare-no auth required    Progress Note Due on Visit 10    PT Start Time 1101    PT Stop Time 1158    PT Time Calculation (min) 57 min    Equipment Utilized During Treatment Other (comment)   performed with S for safety   Activity Tolerance Patient tolerated treatment well;No increased pain    Behavior During Therapy [facility] for tasks assessed/performed             Past Medical History:  Diagnosis Date   Arthritis    Asthma    exercise induced asthma   Bacteremia due to Gram-negative bacteria 06/04/2019   Colon polyps    Diabetes mellitus without complication (HCC)    Duodenal ulcer    Dyspnea    GERD (gastroesophageal reflux disease)    Hyperlipidemia    Hypertension    Neuropathy    right leg and foot   Plantar fasciitis    Pre-diabetes    Prediabetes    RLQ abdominal pain    RLS (restless legs syndrome)    Sleep apnea    CPAP   SOB (shortness of breath)    Urinary incontinence    Urinary tract infection    Wears hearing aid in right ear     Past Surgical History:  Procedure Laterality Date   [place]     lumbar fusion   BILATERAL SALPINGOOPHORECTOMY  01/07/2018   BIOPSY  04/15/2019   Procedure: BIOPSY;  Surgeon: [name], MD;  Location: [place];  Service: Endoscopy;;   CARDIAC CATHETERIZATION  2019   COLONOSCOPY WITH PROPOFOL N/A 11/11/2018   Procedure: COLONOSCOPY WITH PROPOFOL;  Surgeon: [name], MD;  Location: [facility];  Service: Gastroenterology;  Laterality: N/A;   CYSTO WITH HYDRODISTENSION N/A 12/30/2018   Procedure: CYSTOSCOPY/HYDRODISTENSION WITH MARCAINE;  Surgeon: Bjorn Loser, MD;  Location: [facility];  Service: Urology;  Laterality: N/A;   ESOPHAGOGASTRODUODENOSCOPY (EGD) WITH PROPOFOL N/A 04/15/2019   Procedure: ESOPHAGOGASTRODUODENOSCOPY (EGD) WITH PROPOFOL;  Surgeon: [name], MD;  Location: [place];  Service: Endoscopy;  Laterality: N/A;  sleep apnea   ESOPHAGOGASTRODUODENOSCOPY (EGD) WITH PROPOFOL N/A 07/09/2019   Procedure: ESOPHAGOGASTRODUODENOSCOPY (EGD) WITH PROPOFOL;  Surgeon: [name], MD;  Location: [place];  Service: Gastroenterology;  Laterality: N/A;   EYE SURGERY Bilateral    HERNIA REPAIR     abd   HERNIA REPAIR     umbilical   INGUINAL HERNIA REPAIR Bilateral    inguinal   LAPAROSCOPIC BILATERAL SALPINGO OOPHERECTOMY     SPINAL FUSION  2002   TONSILLECTOMY AND ADENOIDECTOMY  1961   TOTAL KNEE ARTHROPLASTY Right 01/01/2020   Procedure: Right total knee arthroplasty - [name] to Assist;  Surgeon: Hessie Knows, MD;  Location: [facility];  Service: Orthopedics;  Laterality: Right;   TUBAL LIGATION      Vitals:   09/22/20 1104  BP: (!) 157/71  Pulse: 66     Subjective Assessment - 09/22/20 1104     Subjective Pt reported she went to MD yesterday regarding BP, as she has felt lightheaded and nauseated with low BP. However, pt reported MD took BP and systolic was not below 409W. Pt out of  breath walking back from lobby to exam room, she states she's had it for years and has had many tests.    Pertinent History HTN, migraine, OSA on CPAP, asthma, IBS, GERD, HLD, neuropathy, osteoporosis, OA, L TKA scheduled for 10/12/20. hx of R TKA, hx of Lumbar spine fusion, IC, chronic bladder pain, RLS, B tinnitus, Vit D deficiency, neuralgia of B pudenal nerves, hx of skin CA, hx of hysterectomy,  dizziness, pt has hearing aides in B ears, colon polyps, duodenal ulcer, plantar fasciitis, hx of UTIs    Patient Stated Goals "Get rid of the vaginal tightness and improve IC."    Currently in Pain? Yes    Pain Score 1    goes in surges: worst 6/10   Pain Location Knee    Pain Orientation Left    Pain Descriptors / Indicators Spasm    Pain Type Chronic pain    Pain Onset More than a month ago    Pain Frequency Intermittent    Aggravating Factors  unsure    Pain Relieving Factors unsure    Multiple Pain Sites Yes    Pain Score 3    Pain Location Back    Pain Orientation Lower    Pain Descriptors / Indicators Aching    Pain Type Chronic pain    Pain Onset More than a month ago    Pain Frequency Constant    Aggravating Factors  walking, prolonged positioning, leaning over    Pain Relieving Factors adjust position and rest             NMR: Access Code: [email or web address] URL: [email or web address] Date: 09/22/2020 Prepared by: Geoffry Paradise  Exercises Supine Diaphragmatic Breathing with Pelvic Floor Lengthening - 3-4 x daily - 7 x weekly - 1 sets - 5 reps Supine Hip Adductor Stretch - 2-3 x daily - 7 x weekly - 1 sets - 3 reps - 30-45 hold Hooklying Clamshell with Resistance - 1 x daily - 3 x weekly - 3 sets - 10 reps - 1-2 hold Supine Anterior Pelvic Tilt - 1 x daily - 7 x weekly - 1 sets - 10 reps Supine Posterior Pelvic Tilt - 1 x daily - 7 x weekly - 3 sets - 10 reps  Cues and demo for technique and manual facilitation at hips during pelvic tilts. Performed with S for safety.                 [facility] Adult PT Treatment/Exercise - 09/22/20 1243       Manual Therapy   Manual Therapy Myofascial release;Soft tissue mobilization    Manual therapy comments Pt reported reduce back pain to 2/10 at end of session.    Soft tissue mobilization PT performed STM to R piriformis and B Tx and Lx spine paraspinals to reduce pain and promote relaxation in order to  reduce pelvic floor pain.    Myofascial Release PT performed over R iliacus and quad lum. to reduce pain.                  SELF CARE:  PT Education - 09/22/20 1234     Education Details Pt educated pt on new  HEP and reviewed previous HEP with cues for breathing. Pt stated she uses ice to reduce pain but falls asleep with ice pack, so PT educated pt to not leave ice on for longer than 20 minutes 2/2 skin integrity and it will cause vasodilation of blood vessels when on for >20 minutes. PT had pt cease B hip add. therex (part of her pre-op) exercises as this can cause incr. hypertonicity of pelvic floor and we are focusing on relaxation. PT educated pt on performing hip ext at counter vs. lying on stomach 2/2 back pain. PT educated pt on WNL for BP and asked pt to continue to monitor at home as systolic BP was elevated but pt asymptomatic, and to inform MD of BP issues if they continue.    Person(s) Educated Patient    Methods Explanation;Demonstration;Tactile cues;Verbal cues;Handout    Comprehension Verbalized understanding;Returned demonstration;Need further instruction              PT Short Term Goals - 09/15/20 5465       PT SHORT TERM GOAL #1   Title Pt will be IND in HEP to improve leakage, strength, posture, and balance. TARGET DATE FOR ALL STGS: 10/06/20    Time 4    Period Weeks    Status New      PT SHORT TERM GOAL #2   Title Pt will report ability to stand for >/= 10 minutes without incr. in pain in order to meal prep.    Time 4    Period Weeks    Status New      PT SHORT TERM GOAL #3   Title Perform pelvic muscle assessment and write goals.    Time 4    Period Weeks    Status Achieved      PT SHORT TERM GOAL #4   Title Pt will verbalize proper toileting posture to fully empty bladder and not strain during bowel movements.    Time 4    Period Weeks    Status New               PT Long Term Goals - 09/15/20 0834       PT LONG TERM GOAL #1    Title Pt will report improve of bowel constipation on FOTO to 59 points and improve urinary issues score to 60 points in order to improve QOL.    Time 10    Period Weeks    Status New      PT LONG TERM GOAL #2   Title Pt will report getting up </=1 time per night to void in order to improve sleep and QOL.    Baseline 3-4 times.    Time 10    Period Weeks    Status New      PT LONG TERM GOAL #3   Title Pt will be able to perform pelvic floor contraction, relaxation and bulge in order to improve strength and relaxation of pelvic floor.    Time 10    Period Weeks    Status New      PT LONG TERM GOAL #4   Title Pt will demo decreased abdominal scar adhesions to progress to deep core exercises and improve motility    Baseline pt states she has not been performing this.    Time 10    Period Weeks    Status New      PT LONG TERM GOAL #5   Title Pt will report type 3 or  4 bowel movments on [place] Stool Chart 75% of the time to reduce strain.    Time 10    Period Weeks    Status New      Additional Long Term Goals   Additional Long Term Goals Yes      PT LONG TERM GOAL #6   Title Pt will report </=3/10 during speculum insertion in order to improve QOL.    Baseline 6/10    Time 10    Period Weeks    Status New                   Plan - 09/22/20 1236     Clinical Impression Statement Today's skilled session focused on manual therapy to reduce pain and relaxation of back, hip and pelvic floor muscles. Pt had lift in R sandal donned today, which decr. LLD (RLE shorter than LLE), PT will place heel lift in tennis shoe next session. Pt demonstrated progress as she reported decr. in back pain after session. Pt continues to require cues and demo to properly perform diaphragmatic breathing and pelvic tilts but improved at end of session. Pt would continue to benefit from skilled PT to improve pain, weakness, decr. flexibility and safety during all ADLs.    PT Frequency 1x / week     PT Duration Other (comment)   10 weeks   PT Treatment/Interventions ADLs/Self Care Home Management;Biofeedback;Neuromuscular re-education;Balance training;Therapeutic exercise;Therapeutic activities;Functional mobility training;Gait training;Patient/family education;Manual techniques;Dry needling    PT Next Visit Plan internal pelvic muscle manual therapy, manual therapy (hips) as needed, trial lift in R shoe for LLD, review HEP for relaxation and B hip/LE strength. Balance training.    Consulted and Agree with Plan of Care Patient             Patient will benefit from skilled therapeutic intervention in order to improve the following deficits and impairments:  Abnormal gait, Decreased coordination, Decreased range of motion, Pain, Decreased balance, Hypomobility, Impaired flexibility, Decreased mobility, Decreased strength, Postural dysfunction  Visit Diagnosis: Other abnormalities of gait and mobility  Other lack of coordination  Muscle weakness (generalized)  Pain in right hip     Problem List Patient Active Problem List   Diagnosis Date Noted   Daytime sleepiness 09/09/2020   Rash 09/09/2020   Medicare annual wellness visit, subsequent 08/25/2020   Encounter for screening mammogram for breast cancer 08/25/2020   Encounter for general adult medical examination with abnormal findings 08/25/2020   GERD (gastroesophageal reflux disease) 08/25/2020   Dizzy spells 08/25/2020   Migraine 03/12/2020   IBS (irritable bowel syndrome) 03/12/2020   S/P TKR (total knee replacement) using cement, right 01/01/2020   History of vaginal hysterectomy 06/11/2019   DM type 2 (diabetes mellitus, type 2) ([place]) 04/22/2019   Estrogen deficiency 04/22/2019   History of skin cancer 04/22/2019   High-tone pelvic floor dysfunction 04/03/2019   IC (interstitial cystitis) 04/03/2019   Neuralgia of both pudendal nerves 04/03/2019   Pelvic pain in female 04/03/2019   Osteoporosis 02/12/2019    Asthma 01/31/2019   Adverse reaction to influenza vaccine, initial encounter 12/03/2018   Vitamin D deficiency 12/03/2018   Restless legs syndrome 11/28/2018   Small fiber neuropathy 11/28/2018   Allergic rhinitis 10/08/2018   Benign essential hypertension 10/08/2018   Chronic bladder pain 10/08/2018   Neuropathy 10/08/2018   Osteoarthritis 10/08/2018   S/P BSO (bilateral salpingo-oophorectomy) 01/07/2018   OSA on CPAP 12/29/2016   Other and unspecified disc disorder of lumbar  region 10/25/2000   DM type 2 with diabetic mixed hyperlipidemia ([place]) 09/24/1998   Tinnitus of both ears 09/24/1983    Bronda Alfred L 09/22/2020, 12:45 PM  [place] MAIN [facility] SERVICES [address] [place], [place], [address] Phone: [phone]   Fax:  [phone]  Name: Erin Petersen MRN: [id number] Date of Birth: 1940-03-29   Geoffry Paradise, PT,DPT 09/22/20 12:46 PM Phone: [phone] Fax: [phone]

## 2020-09-22 NOTE — Assessment & Plan Note (Signed)
Now off amlodipine for hypotension  bp is labile  BP: (!) 160/81 disc that low bp is more worrisome than high  Seldom symptomatic now off amlodipine  Rev note from visit with Dr Einar Pheasant Continues hctz  inst to get a new bp cuff  Given info re: proper way to take it  Also DASH eating  Some stress playing into this aw well (calm today) OSA treated appropriately with cpap inst to check bp with new cuff and f/u if possible before her ortho surgery  Last echo reviewed

## 2020-09-27 ENCOUNTER — Encounter: Payer: Self-pay | Admitting: Family Medicine

## 2020-09-28 ENCOUNTER — Other Ambulatory Visit: Payer: Self-pay

## 2020-09-28 ENCOUNTER — Ambulatory Visit: Payer: Medicare Other | Attending: Obstetrics and Gynecology

## 2020-09-28 VITALS — BP 138/65 | HR 76

## 2020-09-28 DIAGNOSIS — R2689 Other abnormalities of gait and mobility: Secondary | ICD-10-CM | POA: Diagnosis not present

## 2020-09-28 DIAGNOSIS — M25551 Pain in right hip: Secondary | ICD-10-CM | POA: Insufficient documentation

## 2020-09-28 DIAGNOSIS — R278 Other lack of coordination: Secondary | ICD-10-CM | POA: Insufficient documentation

## 2020-09-28 DIAGNOSIS — M6281 Muscle weakness (generalized): Secondary | ICD-10-CM | POA: Diagnosis not present

## 2020-09-28 MED ORDER — ACYCLOVIR 5 % EX OINT: 1.0000 "application " | TOPICAL_OINTMENT | Freq: Every day | CUTANEOUS | 1 refills | Status: DC | PRN

## 2020-09-28 NOTE — Therapy (Signed)
[place] MAIN [facility] SERVICES [address] [place], [place], [address] Phone: [phone]   Fax:  [phone]  Physical Therapy Treatment  Patient Details  Name: Erin Petersen MRN: [id number] Date of Birth: 1940/12/01 Referring Provider (PT): Benjaman Kindler, MD   Encounter Date: 09/28/2020   PT End of Session - 09/28/20 0914     Visit Number 4    Number of Visits 10    Date for PT Re-Evaluation 11/07/20    Authorization Type BCBS Medicare-no auth required    Progress Note Due on Visit 10    PT Start Time 0802    PT Stop Time 0902    PT Time Calculation (min) 60 min    Activity Tolerance Patient tolerated treatment well;Patient limited by pain    Behavior During Therapy [facility] for tasks assessed/performed             Past Medical History:  Diagnosis Date   Arthritis    Asthma    exercise induced asthma   Bacteremia due to Gram-negative bacteria 06/04/2019   Colon polyps    Diabetes mellitus without complication (HCC)    Duodenal ulcer    Dyspnea    GERD (gastroesophageal reflux disease)    Hyperlipidemia    Hypertension    Neuropathy    right leg and foot   Plantar fasciitis    Pre-diabetes    Prediabetes    RLQ abdominal pain    RLS (restless legs syndrome)    Sleep apnea    CPAP   SOB (shortness of breath)    Urinary incontinence    Urinary tract infection    Wears hearing aid in right ear     Past Surgical History:  Procedure Laterality Date   [place]     lumbar fusion   BILATERAL SALPINGOOPHORECTOMY  01/07/2018   BIOPSY  04/15/2019   Procedure: BIOPSY;  Surgeon: [name], MD;  Location: [place];  Service: Endoscopy;;   CARDIAC CATHETERIZATION  2019   COLONOSCOPY WITH PROPOFOL N/A 11/11/2018   Procedure: COLONOSCOPY WITH PROPOFOL;  Surgeon: [name], MD;  Location: [facility];  Service: Gastroenterology;   Laterality: N/A;   CYSTO WITH HYDRODISTENSION N/A 12/30/2018   Procedure: CYSTOSCOPY/HYDRODISTENSION WITH MARCAINE;  Surgeon: Bjorn Loser, MD;  Location: [facility];  Service: Urology;  Laterality: N/A;   ESOPHAGOGASTRODUODENOSCOPY (EGD) WITH PROPOFOL N/A 04/15/2019   Procedure: ESOPHAGOGASTRODUODENOSCOPY (EGD) WITH PROPOFOL;  Surgeon: [name], MD;  Location: [place];  Service: Endoscopy;  Laterality: N/A;  sleep apnea   ESOPHAGOGASTRODUODENOSCOPY (EGD) WITH PROPOFOL N/A 07/09/2019   Procedure: ESOPHAGOGASTRODUODENOSCOPY (EGD) WITH PROPOFOL;  Surgeon: [name], MD;  Location: [place];  Service: Gastroenterology;  Laterality: N/A;   EYE SURGERY Bilateral    HERNIA REPAIR     abd   HERNIA REPAIR     umbilical   INGUINAL HERNIA REPAIR Bilateral    inguinal   LAPAROSCOPIC BILATERAL SALPINGO OOPHERECTOMY     SPINAL FUSION  2002   TONSILLECTOMY AND ADENOIDECTOMY  1961   TOTAL KNEE ARTHROPLASTY Right 01/01/2020   Procedure: Right total knee arthroplasty - [name] to Assist;  Surgeon: Hessie Knows, MD;  Location: [facility];  Service: Orthopedics;  Laterality: Right;   TUBAL LIGATION      Vitals:   09/28/20 0806  BP: 138/65  Pulse: 76     Subjective Assessment - 09/28/20  9937     Subjective Pt reported she felt awful yesterday and BP was lower, she was lightheaded, dizzy and nauseated. She tested sugar yesterday and it was fine. She is still nauseated this morning but BP was fine at home. Pt reported pelvic pain is the same. Pt reported pelvic tilt is still challening.                               [facility] Adult PT Treatment/Exercise - 09/28/20 0858       Manual Therapy   Manual Therapy Muscle Energy Technique;Internal Pelvic Floor;Passive ROM    Manual therapy comments Pt reported pelvic pain 4/10 after manual therapy. Heat applied to perineal area with layers at end of session with breathing and relaxtion technique  (pillow placement).    Internal Pelvic Floor Trigger point release to R and L levator ani and obturator internus to reduce pelvic pain.    Passive ROM B hamstring stretch, B lower trunk rotation stretch, B piriformis (knee to contralateral shoulder) stretch to improve relaxation of hip muscles in order to reduce pelvic floor and back pain after internal trigger point release. 1-3 reps/LE with 30-45 sec.hold    Muscle Energy Technique B hip adductor stretch with MET 3x30sec. holds after MET to relax musculature.                    PT Education - 09/28/20 0913     Education Details PT educated pt on the importance of performing daily stretches 2-3x/day to reduce pain and improve flexibility. PT educated pt on using heat pad for 15 minutes (with layers for skin integrity) to reduce pain.    Person(s) Educated Patient    Methods Explanation    Comprehension Verbalized understanding              PT Short Term Goals - 09/15/20 1696       PT SHORT TERM GOAL #1   Title Pt will be IND in HEP to improve leakage, strength, posture, and balance. TARGET DATE FOR ALL STGS: 10/06/20    Time 4    Period Weeks    Status New      PT SHORT TERM GOAL #2   Title Pt will report ability to stand for >/= 10 minutes without incr. in pain in order to meal prep.    Time 4    Period Weeks    Status New      PT SHORT TERM GOAL #3   Title Perform pelvic muscle assessment and write goals.    Time 4    Period Weeks    Status Achieved      PT SHORT TERM GOAL #4   Title Pt will verbalize proper toileting posture to fully empty bladder and not strain during bowel movements.    Time 4    Period Weeks    Status New               PT Long Term Goals - 09/15/20 0834       PT LONG TERM GOAL #1   Title Pt will report improve of bowel constipation on FOTO to 59 points and improve urinary issues score to 60 points in order to improve QOL.    Time 10    Period Weeks    Status New      PT  LONG TERM GOAL #2   Title Pt will report getting up </=  1 time per night to void in order to improve sleep and QOL.    Baseline 3-4 times.    Time 10    Period Weeks    Status New      PT LONG TERM GOAL #3   Title Pt will be able to perform pelvic floor contraction, relaxation and bulge in order to improve strength and relaxation of pelvic floor.    Time 10    Period Weeks    Status New      PT LONG TERM GOAL #4   Title Pt will demo decreased abdominal scar adhesions to progress to deep core exercises and improve motility    Baseline pt states she has not been performing this.    Time 10    Period Weeks    Status New      PT LONG TERM GOAL #5   Title Pt will report type 3 or 4 bowel movments on [place] Stool Chart 75% of the time to reduce strain.    Time 10    Period Weeks    Status New      Additional Long Term Goals   Additional Long Term Goals Yes      PT LONG TERM GOAL #6   Title Pt will report </=3/10 during speculum insertion in order to improve QOL.    Baseline 6/10    Time 10    Period Weeks    Status New                   Plan - 09/28/20 0915     Clinical Impression Statement Today's skilled session focused on manual therapy to reduce pelvic pain and hip tightness. Pt noted to experience incr. trigger points throughout internal pelvic floor musculature and required diaphragmatic breathing in order to relax pelvic floor. Pt reported incr. in pain at end of session as pelvic pain referred to pt's lower abdomen. However, during internal pelvic floor trigger point release PT was able to feel trigger point release in L levator ani. Pt would continue to benefit from skilled PT to improve pain, strength, flexilbility and balance during all ADLs.    PT Frequency 1x / week    PT Duration Other (comment)   10 weeks   PT Treatment/Interventions ADLs/Self Care Home Management;Biofeedback;Neuromuscular re-education;Balance training;Therapeutic exercise;Therapeutic  activities;Functional mobility training;Gait training;Patient/family education;Manual techniques;Dry needling    PT Next Visit Plan Check goals. internal pelvic muscle manual therapy, manual therapy (hips) as needed, trial lift in R shoe for LLD, review HEP for relaxation and B hip/LE strength. Balance training.    Consulted and Agree with Plan of Care Patient             Patient will benefit from skilled therapeutic intervention in order to improve the following deficits and impairments:  Abnormal gait, Decreased coordination, Decreased range of motion, Pain, Decreased balance, Hypomobility, Impaired flexibility, Decreased mobility, Decreased strength, Postural dysfunction  Visit Diagnosis: Pain in right hip  Other abnormalities of gait and mobility  Other lack of coordination  Muscle weakness (generalized)     Problem List Patient Active Problem List   Diagnosis Date Noted   Daytime sleepiness 09/09/2020   Rash 09/09/2020   Medicare annual wellness visit, subsequent 08/25/2020   Encounter for screening mammogram for breast cancer 08/25/2020   Encounter for general adult medical examination with abnormal findings 08/25/2020   GERD (gastroesophageal reflux disease) 08/25/2020   Dizzy spells 08/25/2020   Migraine 03/12/2020   IBS (irritable  bowel syndrome) 03/12/2020   S/P TKR (total knee replacement) using cement, right 01/01/2020   History of vaginal hysterectomy 06/11/2019   DM type 2 (diabetes mellitus, type 2) ([place]) 04/22/2019   Estrogen deficiency 04/22/2019   History of skin cancer 04/22/2019   High-tone pelvic floor dysfunction 04/03/2019   IC (interstitial cystitis) 04/03/2019   Neuralgia of both pudendal nerves 04/03/2019   Pelvic pain in female 04/03/2019   Osteoporosis 02/12/2019   Asthma 01/31/2019   Adverse reaction to influenza vaccine, initial encounter 12/03/2018   Vitamin D deficiency 12/03/2018   Restless legs syndrome 11/28/2018   Small fiber  neuropathy 11/28/2018   Allergic rhinitis 10/08/2018   Benign essential hypertension 10/08/2018   Chronic bladder pain 10/08/2018   Neuropathy 10/08/2018   Osteoarthritis 10/08/2018   S/P BSO (bilateral salpingo-oophorectomy) 01/07/2018   OSA on CPAP 12/29/2016   Other and unspecified disc disorder of lumbar region 10/25/2000   DM type 2 with diabetic mixed hyperlipidemia ([place]) 09/24/1998   Tinnitus of both ears 09/24/1983    Parker Sawatzky L 09/28/2020, 9:20 AM  [place] MAIN [facility] SERVICES [address] [place], [place], [address] Phone: [phone]   Fax:  [phone]  Name: VALISHA HESLIN MRN: [id number] Date of Birth: [birth year]   Geoffry Paradise, PT,DPT 09/28/20 9:20 AM Phone: [phone] Fax: [phone]

## 2020-09-30 MED ORDER — VALACYCLOVIR HCL 1 G PO TABS: ORAL_TABLET | ORAL | 3 refills | Status: DC

## 2020-09-30 NOTE — Addendum Note (Signed)
Addended by: Loura Pardon A on: 09/30/2020 04:12 PM   Modules accepted: Orders

## 2020-10-01 ENCOUNTER — Other Ambulatory Visit: Payer: Self-pay

## 2020-10-01 ENCOUNTER — Encounter
Admission: RE | Admit: 2020-10-01 | Discharge: 2020-10-01 | Disposition: A | Discharge status: Home / Self Care | Payer: Medicare Other | Source: Ambulatory Visit | Attending: Orthopedic Surgery | Admitting: Orthopedic Surgery

## 2020-10-01 DIAGNOSIS — Z01818 Encounter for other preprocedural examination: Secondary | ICD-10-CM | POA: Diagnosis not present

## 2020-10-01 LAB — URINALYSIS, ROUTINE W REFLEX MICROSCOPIC
Bacteria, UA: NONE SEEN
Bilirubin Urine: NEGATIVE
Glucose, UA: NEGATIVE mg/dL
Hgb urine dipstick: NEGATIVE
Ketones, ur: NEGATIVE mg/dL
Nitrite: NEGATIVE
Protein, ur: NEGATIVE mg/dL
Specific Gravity, Urine: 1.02 (ref 1.005–1.030)
pH: 5 (ref 5.0–8.0)

## 2020-10-01 LAB — COMPREHENSIVE METABOLIC PANEL
ALT: 30 U/L (ref 0–44)
AST: 25 U/L (ref 15–41)
Albumin: 4.4 g/dL (ref 3.5–5.0)
Alkaline Phosphatase: 67 U/L (ref 38–126)
Anion gap: 8 (ref 5–15)
BUN: 20 mg/dL (ref 8–23)
CO2: 30 mmol/L (ref 22–32)
Calcium: 9.6 mg/dL (ref 8.9–10.3)
Chloride: 99 mmol/L (ref 98–111)
Creatinine, Ser: 0.82 mg/dL (ref 0.44–1.00)
GFR, Estimated: >60 mL/min (ref >60)
Glucose, Bld: 122 mg/dL — ABNORMAL HIGH (ref 70–99)
Potassium: 3.5 mmol/L (ref 3.5–5.1)
Sodium: 137 mmol/L (ref 135–145)
Total Bilirubin: 0.4 mg/dL (ref 0.3–1.2)
Total Protein: 7.2 g/dL (ref 6.5–8.1)

## 2020-10-01 LAB — CBC WITH DIFFERENTIAL/PLATELET
Abs Immature Granulocytes: 0.03 10*3/uL (ref 0.00–0.07)
Basophils Absolute: 0.1 10*3/uL (ref 0.0–0.1)
Basophils Relative: 1 %
Eosinophils Absolute: 0.2 10*3/uL (ref 0.0–0.5)
Eosinophils Relative: 3 %
HCT: 38 % (ref 36.0–46.0)
Hemoglobin: 12.2 g/dL (ref 12.0–15.0)
Immature Granulocytes: 1 %
Lymphocytes Relative: 32 %
Lymphs Abs: 2 10*3/uL (ref 0.7–4.0)
MCH: 26.3 pg (ref 26.0–34.0)
MCHC: 32.1 g/dL (ref 30.0–36.0)
MCV: 81.9 fL (ref 80.0–100.0)
Monocytes Absolute: 0.7 10*3/uL (ref 0.1–1.0)
Monocytes Relative: 12 %
Neutro Abs: 3.2 10*3/uL (ref 1.7–7.7)
Neutrophils Relative %: 51 %
Platelets: 315 10*3/uL (ref 150–400)
RBC: 4.64 MIL/uL (ref 3.87–5.11)
RDW: 15.4 % (ref 11.5–15.5)
WBC: 6.2 10*3/uL (ref 4.0–10.5)
nRBC: 0 % (ref 0.0–0.2)

## 2020-10-01 LAB — SURGICAL PCR SCREEN
MRSA, PCR: NEGATIVE
Staphylococcus aureus: NEGATIVE

## 2020-10-01 LAB — TYPE AND SCREEN
ABO/RH(D): O POS
Antibody Screen: NEGATIVE

## 2020-10-01 NOTE — Patient Instructions (Addendum)
Your procedure is scheduled on: Tuesday October 12, 2020. Report to Day Surgery inside [facility] 2nd floor stop by admissions desk first before getting on elevator. To find out your arrival time please call [phone] between 1PM - 3PM on Monday October 11, 2020.  Remember: Instructions that are not followed completely may result in serious medical risk,  up to and including death, or upon the discretion of your surgeon and anesthesiologist your  surgery may need to be rescheduled.     _X__ 1. Do not eat food after midnight the night before your procedure.                 No chewing gum or hard candies. You may drink clear liquids up to 2 hours                 before you are scheduled to arrive for your surgery- DO not drink clear                 liquids within 2 hours of the start of your surgery.                 Clear Liquids include:  water, G2 or                  Gatorade Zero (avoid Red/Purple/Blue), Black Coffee or Tea (Do not add                 anything to coffee or tea).  __X__2.  On the morning of surgery brush your teeth with toothpaste and water, you                may rinse your mouth with mouthwash if you wish.  Do not swallow any toothpaste of mouthwash.     _X__ 3.  No Alcohol for 24 hours before or after surgery.   _X__ 4.  Do Not Smoke or use e-cigarettes For 24 Hours Prior to Your Surgery.                 Do not use any chewable tobacco products for at least 6 hours prior to                 Surgery.  _X__  5.  Do not use any recreational drugs (marijuana, cocaine, heroin, ecstasy, MDMA or other)                For at least one week prior to your surgery.  Combination of these drugs with anesthesia                May have life threatening results.  __X__6.  Notify your doctor if there is any change in your medical condition      (cold, fever, infections).     Do not wear jewelry, make-up, hairpins, clips or nail polish. Do not wear  lotions, powders, or perfumes. You may wear deodorant. Do not shave 48 hours prior to surgery. Men may shave face and neck. Do not bring valuables to the hospital.    [facility] is not responsible for any belongings or valuables.  Contacts, dentures or bridgework may not be worn into surgery. Leave your suitcase in the car. After surgery it may be brought to your room. For patients admitted to the hospital, discharge time is determined by your treatment team.   Patients discharged the day of surgery will not be allowed to drive home.   Make arrangements for someone  to be with you for the first 24 hours of your Same Day Discharge.    Please read over the following fact sheets that you were given:   Total Joint Packet    __X__ Take these medicines the morning of surgery with A SIP OF WATER:    1. LINZESS 145 MCG   2. omeprazole (PRILOSEC) 40 MG (take one dose the night before surgery and the morning of surgery)  3. pregabalin (LYRICA) 150 MG   4. rOPINIRole (REQUIP) 1 MG   5.  6.  ____ Fleet Enema (as directed)   __X__ Use CHG Soap (or wipes) as directed  ____ Use Benzoyl Peroxide Gel as instructed  __X__ Use inhalers on the day of surgery  albuterol (VENTOLIN HFA) 108 (90 Base) MCG/ACT inhaler  ____ Stop metformin 2 days prior to surgery    ____ Take 1/2 of usual insulin dose the night before surgery. No insulin the morning          of surgery.   __X__ one week before your surgery stop Call your PCP, aspirin 81 mg.    __X__ One Week prior to surgery- Stop Anti-inflammatories such as Ibuprofen, Aleve, Advil, Motrin, meloxicam (MOBIC), diclofenac, etodolac, ketorolac, Toradol, Daypro, piroxicam, Goody's or BC powders. OK TO USE TYLENOL IF NEEDED   __X__ One week prior to surgery- stop all supplements until after surgery.    __X__ Bring C-Pap to the hospital if in good standing.   If you have any questions regarding your pre-procedure instructions,  Please call  Pre-admit Testing at [phone]

## 2020-10-04 ENCOUNTER — Ambulatory Visit: Payer: Medicare Other

## 2020-10-07 ENCOUNTER — Ambulatory Visit: Payer: Medicare Other

## 2020-10-08 ENCOUNTER — Other Ambulatory Visit
Admission: RE | Admit: 2020-10-08 | Discharge: 2020-10-08 | Disposition: A | Discharge status: Home / Self Care | Payer: Medicare Other | Source: Ambulatory Visit | Attending: Orthopedic Surgery | Admitting: Orthopedic Surgery

## 2020-10-08 ENCOUNTER — Other Ambulatory Visit: Payer: Self-pay

## 2020-10-08 DIAGNOSIS — Z20822 Contact with and (suspected) exposure to covid-19: Secondary | ICD-10-CM | POA: Diagnosis not present

## 2020-10-08 DIAGNOSIS — Z01812 Encounter for preprocedural laboratory examination: Secondary | ICD-10-CM | POA: Insufficient documentation

## 2020-10-08 LAB — SARS CORONAVIRUS 2 (TAT 6-24 HRS): SARS Coronavirus 2: NEGATIVE

## 2020-10-11 ENCOUNTER — Ambulatory Visit: Payer: Medicare Other

## 2020-10-11 ENCOUNTER — Other Ambulatory Visit: Payer: Self-pay

## 2020-10-11 DIAGNOSIS — M25551 Pain in right hip: Secondary | ICD-10-CM | POA: Diagnosis not present

## 2020-10-11 DIAGNOSIS — M6281 Muscle weakness (generalized): Secondary | ICD-10-CM

## 2020-10-11 DIAGNOSIS — R278 Other lack of coordination: Secondary | ICD-10-CM | POA: Diagnosis not present

## 2020-10-11 DIAGNOSIS — R2689 Other abnormalities of gait and mobility: Secondary | ICD-10-CM | POA: Diagnosis not present

## 2020-10-11 MED ORDER — CEFAZOLIN SODIUM-DEXTROSE 2-4 GM/100ML-% IV SOLN
2.0000 g | INTRAVENOUS | Status: AC
Start: 2020-10-12 — End: 2020-10-12
  Administered 2020-10-12: 2 g via INTRAVENOUS

## 2020-10-11 NOTE — Therapy (Signed)
[place] MAIN [facility] SERVICES [address] [place], [place], [address] Phone: [phone]   Fax:  [phone]  Physical Therapy Treatment  Patient Details  Name: Erin Petersen MRN: [id number] Date of Birth: Jun 19, 1940 Referring Provider (PT): Benjaman Kindler, MD   Encounter Date: 10/11/2020   PT End of Session - 10/11/20 1009     Visit Number 5    Number of Visits 10    Date for PT Re-Evaluation 11/07/20    Authorization Type BCBS Medicare-no auth required    Progress Note Due on Visit 10    PT Start Time 0904    PT Stop Time [phone]   pt had to leave early   PT Time Calculation (min) 48 min    Activity Tolerance Patient tolerated treatment well;Patient limited by pain    Behavior During Therapy [facility] : [facility] for tasks assessed/performed             Past Medical History:  Diagnosis Date   Arthritis    Asthma    exercise induced asthma   Bacteremia due to Gram-negative bacteria 06/04/2019   Colon polyps    Diabetes mellitus without complication (HCC)    Duodenal ulcer    Dyspnea    GERD (gastroesophageal reflux disease)    Hyperlipidemia    Hypertension    Neuropathy    right leg and foot   Plantar fasciitis    Pre-diabetes    Prediabetes    RLQ abdominal pain    RLS (restless legs syndrome)    Sleep apnea    CPAP   SOB (shortness of breath)    Urinary incontinence    Urinary tract infection    Wears hearing aid in right ear     Past Surgical History:  Procedure Laterality Date   [place]  2002   lumbar fusion   BILATERAL SALPINGOOPHORECTOMY  01/07/2018   BIOPSY  04/15/2019   Procedure: BIOPSY;  Surgeon: [name], MD;  Location: [facility];  Service: Endoscopy;;   CARDIAC CATHETERIZATION  2019   COLONOSCOPY WITH PROPOFOL N/A 11/11/2018   Procedure: COLONOSCOPY WITH PROPOFOL;  Surgeon: [name], MD;  Location: [facility];   Service: Gastroenterology;  Laterality: N/A;   CYSTO WITH HYDRODISTENSION N/A 12/30/2018   Procedure: CYSTOSCOPY/HYDRODISTENSION WITH MARCAINE;  Surgeon: Bjorn Loser, MD;  Location: [facility];  Service: Urology;  Laterality: N/A;   ESOPHAGOGASTRODUODENOSCOPY (EGD) WITH PROPOFOL N/A 04/15/2019   Procedure: ESOPHAGOGASTRODUODENOSCOPY (EGD) WITH PROPOFOL;  Surgeon: [name], MD;  Location: [facility];  Service: Endoscopy;  Laterality: N/A;  sleep apnea   ESOPHAGOGASTRODUODENOSCOPY (EGD) WITH PROPOFOL N/A 07/09/2019   Procedure: ESOPHAGOGASTRODUODENOSCOPY (EGD) WITH PROPOFOL;  Surgeon: [name], MD;  Location: [facility];  Service: Gastroenterology;  Laterality: N/A;   EYE SURGERY Bilateral    HERNIA REPAIR  2018   abd   HERNIA REPAIR     umbilical   INGUINAL HERNIA REPAIR Bilateral    inguinal   JOINT REPLACEMENT     LAPAROSCOPIC BILATERAL SALPINGO OOPHERECTOMY  2019   SPINAL FUSION  2002   TONSILLECTOMY AND ADENOIDECTOMY  1961   TOTAL KNEE ARTHROPLASTY Right 01/01/2020   Procedure: Right total knee arthroplasty - [name] to Assist;  Surgeon: Hessie Knows, MD;  Location: [facility];  Service: Orthopedics;  Laterality: Right;   TUBAL LIGATION      There were no vitals filed for this  visit.   Subjective Assessment - 10/11/20 0907     Subjective Pt is excited for her TKA scheduled for tomorrow. Pt reported she feels worse after internal muscle trigger point release. Pt reported HEP is going well. Pt stated L knee is painful this morning as she has not amb. much yet this morning and she didn't sleep well last night.    Pertinent History HTN, migraine, OSA on CPAP, asthma, IBS, GERD, HLD, neuropathy, osteoporosis, OA, L TKA scheduled for 10/12/20. hx of R TKA, hx of Lumbar spine fusion, IC, chronic bladder pain, RLS, B tinnitus, Vit D deficiency, neuralgia of B pudenal nerves, hx of skin CA, hx of hysterectomy, dizziness, pt has hearing aides in B ears,  colon polyps, duodenal ulcer, plantar fasciitis, hx of UTIs    Patient Stated Goals "Get rid of the vaginal tightness and improve IC."    Currently in Pain? Yes    Pain Score 3     Pain Location Knee    Pain Orientation Left    Pain Descriptors / Indicators Aching    Pain Type Chronic pain    Pain Onset More than a month ago    Pain Frequency Intermittent    Aggravating Factors  worse in the morning    Pain Relieving Factors walking             NMR: Access Code: [email or web address] URL: [email or web address] Date: 10/11/2020 Prepared by: Geoffry Paradise  Exercises Supine Diaphragmatic Breathing with Pelvic Floor Lengthening - 3-4 x daily - 7 x weekly - 1 sets - 5 reps Supine Hip Adductor Stretch - 2-3 x daily - 7 x weekly - 1 sets - 3 reps - 30-45 hold Hooklying Clamshell with Resistance - 1 x daily - 3 x weekly - 3 sets - 10 reps - 1-2 hold Supine Anterior Pelvic Tilt - 1 x daily - 7 x weekly - 1 sets - 10 reps Supine Posterior Pelvic Tilt - 1 x daily - 7 x weekly - 3 sets - 10 reps  Supine B hip flexor stretch off table with cues x30 sec. Before and after STM. Cues and demo for technique during pelvic tilts with improved ant. Tilt after manual therapy of B hip flexors. Cues to hold add. Stretch for 45 sec. Vs. 10 sec.                [facility] Adult PT Treatment/Exercise - 10/11/20 1012       Manual Therapy   Manual Therapy Soft tissue mobilization;Internal Pelvic Floor    Manual therapy comments Pt reportd 4-5/10 after manual therapy.    Soft tissue mobilization B hip flexor trigger point and STM to reduce tightness and pain, also limiting ant. pelvic tilt-which improved after STM.    Internal Pelvic Floor Trigger point release to B levator ani, 7-8/10 pain even with breathing, had to cease 2/2 pain.                    PT Education - 10/11/20 1009     Education Details PT explained the importanct of stretches and relaxation to decr. tension in  pelvic floor. PT explained d/c today as pt having L TKA tomorrow and that she'll need a new referral for pelvic PT after TKA PT completed.    Person(s) Educated Patient    Methods Explanation;Demonstration;Tactile cues    Comprehension Verbalized understanding;Need further instruction              PT Short Term Goals -  10/11/20 0923       PT SHORT TERM GOAL #1   Title Pt will be IND in HEP to improve leakage, strength, posture, and balance. TARGET DATE FOR ALL STGS: 10/06/20    Baseline intermittent cues required on 07/11/20    Time 4    Period Weeks    Status Partially Met      PT SHORT TERM GOAL #2   Title Pt will report ability to stand for >/= 10 minutes without incr. in pain in order to meal prep.    Baseline 10/11/20: if bladder is empty she can stand approx. 60 minutes, but less if bladder is full.    Time 4    Period Weeks    Status Achieved      PT SHORT TERM GOAL #3   Title Perform pelvic muscle assessment and write goals.    Time 4    Period Weeks    Status Achieved      PT SHORT TERM GOAL #4   Title Pt will verbalize proper toileting posture to fully empty bladder and not strain during bowel movements.    Baseline 10/11/20: able to verbalize proper toileting posture for bowel and bladder movements    Time 4    Period Weeks    Status Achieved               PT Long Term Goals - 10/11/20 0913       PT LONG TERM GOAL #1   Title Pt will report improve of bowel constipation on FOTO to 59 points and improve urinary issues score to 60 points in order to improve QOL.    Time 10    Period Weeks    Status Deferred      PT LONG TERM GOAL #2   Title Pt will report getting up </=1 time per night to void in order to improve sleep and QOL.    Baseline 3-4 times baseline, still the same    Time 10    Period Weeks    Status Not Met      PT LONG TERM GOAL #3   Title Pt will be able to perform pelvic floor contraction, relaxation and bulge in order to improve  strength and relaxation of pelvic floor.    Time 10    Period Weeks    Status Deferred      PT LONG TERM GOAL #4   Title Pt will demo decreased abdominal scar adhesions to progress to deep core exercises and improve motility    Baseline pt states she has not been performing this.    Time 10    Period Weeks    Status Deferred      PT LONG TERM GOAL #5   Title Pt will report type 3 or 4 bowel movments on [place] Stool Chart 75% of the time to reduce strain.    Baseline 10/11/20: types 3 and 4 approx. 80% of the time.    Time 10    Period Weeks    Status Achieved      PT LONG TERM GOAL #6   Title Pt will report </=3/10 during speculum insertion in order to improve QOL.    Baseline 6/10 baseline; 10/11/20: pt states it depends    Time 10    Period Weeks    Status Not Met                   Plan - 10/11/20 1010  Clinical Impression Statement D/c today as pt having L TKA tomorrow. Please see d/c summary for details.    PT Frequency 1x / week    PT Duration Other (comment)   10 weeks   PT Treatment/Interventions ADLs/Self Care Home Management;Biofeedback;Neuromuscular re-education;Balance training;Therapeutic exercise;Therapeutic activities;Functional mobility training;Gait training;Patient/family education;Manual techniques;Dry needling    PT Next Visit Plan d/c    Consulted and Agree with Plan of Care Patient             Patient will benefit from skilled therapeutic intervention in order to improve the following deficits and impairments:  Abnormal gait, Decreased coordination, Decreased range of motion, Pain, Decreased balance, Hypomobility, Impaired flexibility, Decreased mobility, Decreased strength, Postural dysfunction  Visit Diagnosis: Other abnormalities of gait and mobility  Other lack of coordination  Muscle weakness (generalized)     Problem List Patient Active Problem List   Diagnosis Date Noted   Daytime sleepiness 09/09/2020   Rash 09/09/2020    Medicare annual wellness visit, subsequent 08/25/2020   Encounter for screening mammogram for breast cancer 08/25/2020   Encounter for general adult medical examination with abnormal findings 08/25/2020   GERD (gastroesophageal reflux disease) 08/25/2020   Dizzy spells 08/25/2020   Migraine 03/12/2020   IBS (irritable bowel syndrome) 03/12/2020   S/P TKR (total knee replacement) using cement, right 01/01/2020   History of vaginal hysterectomy 06/11/2019   DM type 2 (diabetes mellitus, type 2) ([place]) 04/22/2019   Estrogen deficiency 04/22/2019   History of skin cancer 04/22/2019   High-tone pelvic floor dysfunction 04/03/2019   IC (interstitial cystitis) 04/03/2019   Neuralgia of both pudendal nerves 04/03/2019   Pelvic pain in female 04/03/2019   Osteoporosis 02/12/2019   Asthma 01/31/2019   Adverse reaction to influenza vaccine, initial encounter 12/03/2018   Vitamin D deficiency 12/03/2018   Restless legs syndrome 11/28/2018   Small fiber neuropathy 11/28/2018   Allergic rhinitis 10/08/2018   Benign essential hypertension 10/08/2018   Chronic bladder pain 10/08/2018   Neuropathy 10/08/2018   Osteoarthritis 10/08/2018   S/P BSO (bilateral salpingo-oophorectomy) 01/07/2018   OSA on CPAP 12/29/2016   Other and unspecified disc disorder of lumbar region 10/25/2000   DM type 2 with diabetic mixed hyperlipidemia ([place]) 09/24/1998   Tinnitus of both ears 09/24/1983    Analysa Nutting L 10/11/2020, 11:03 AM  [place] MAIN [facility] SERVICES [address] [place], [place], [address] Phone: [phone]   Fax:  [phone]  Name: Erin Petersen MRN: [id number] Date of Birth: 11-14-1940  PHYSICAL THERAPY DISCHARGE SUMMARY  Visits from Start of Care: 5  Current functional level related to goals / functional outcomes:   PT Short Term Goals - 10/11/20 0923       PT SHORT TERM GOAL #1   Title Pt will be IND in HEP to improve leakage,  strength, posture, and balance. TARGET DATE FOR ALL STGS: 10/06/20    Baseline intermittent cues required on 07/11/20    Time 4    Period Weeks    Status Partially Met      PT SHORT TERM GOAL #2   Title Pt will report ability to stand for >/= 10 minutes without incr. in pain in order to meal prep.    Baseline 10/11/20: if bladder is empty she can stand approx. 60 minutes, but less if bladder is full.    Time 4    Period Weeks    Status Achieved      PT SHORT TERM GOAL #  3   Title Perform pelvic muscle assessment and write goals.    Time 4    Period Weeks    Status Achieved      PT SHORT TERM GOAL #4   Title Pt will verbalize proper toileting posture to fully empty bladder and not strain during bowel movements.    Baseline 10/11/20: able to verbalize proper toileting posture for bowel and bladder movements    Time 4    Period Weeks    Status Achieved               PT Long Term Goals - 10/11/20 0913       PT LONG TERM GOAL #1   Title Pt will report improve of bowel constipation on FOTO to 59 points and improve urinary issues score to 60 points in order to improve QOL.    Time 10    Period Weeks    Status Deferred      PT LONG TERM GOAL #2   Title Pt will report getting up </=1 time per night to void in order to improve sleep and QOL.    Baseline 3-4 times baseline, still the same    Time 10    Period Weeks    Status Not Met      PT LONG TERM GOAL #3   Title Pt will be able to perform pelvic floor contraction, relaxation and bulge in order to improve strength and relaxation of pelvic floor.    Time 10    Period Weeks    Status Deferred      PT LONG TERM GOAL #4   Title Pt will demo decreased abdominal scar adhesions to progress to deep core exercises and improve motility    Baseline pt states she has not been performing this.    Time 10    Period Weeks    Status Deferred      PT LONG TERM GOAL #5   Title Pt will report type 3 or 4 bowel movments on [place] Stool  Chart 75% of the time to reduce strain.    Baseline 10/11/20: types 3 and 4 approx. 80% of the time.    Time 10    Period Weeks    Status Achieved      PT LONG TERM GOAL #6   Title Pt will report </=3/10 during speculum insertion in order to improve QOL.    Baseline 6/10 baseline; 10/11/20: pt states it depends    Time 10    Period Weeks    Status Not Met           Remaining deficits: Pain and decr. flexibility   Education / Equipment: HEP   Patient agrees to discharge. Patient goals were partially met. Patient is being discharged due to a change in medical status.have L TKA on 10/12/20.   Geoffry Paradise, PT,DPT 10/11/20 11:03 AM Phone: [phone] Fax: [phone]

## 2020-10-12 ENCOUNTER — Observation Stay
Admission: RE | Admit: 2020-10-12 | Discharge: 2020-10-14 | Disposition: A | Discharge status: Home / Self Care | Payer: Medicare Other | Attending: Orthopedic Surgery | Admitting: Orthopedic Surgery

## 2020-10-12 ENCOUNTER — Ambulatory Visit: Payer: Medicare Other | Admitting: Anesthesiology

## 2020-10-12 ENCOUNTER — Other Ambulatory Visit: Payer: Self-pay

## 2020-10-12 ENCOUNTER — Encounter: Payer: Self-pay | Admitting: Orthopedic Surgery

## 2020-10-12 ENCOUNTER — Observation Stay: Payer: Medicare Other

## 2020-10-12 ENCOUNTER — Ambulatory Visit: Payer: Medicare Other | Admitting: Urgent Care

## 2020-10-12 ENCOUNTER — Encounter
Admission: RE | Disposition: A | Discharge status: Home / Self Care | Payer: Self-pay | Source: Home / Self Care | Attending: Orthopedic Surgery

## 2020-10-12 DIAGNOSIS — I10 Essential (primary) hypertension: Secondary | ICD-10-CM | POA: Insufficient documentation

## 2020-10-12 DIAGNOSIS — G8918 Other acute postprocedural pain: Secondary | ICD-10-CM

## 2020-10-12 DIAGNOSIS — Z85828 Personal history of other malignant neoplasm of skin: Secondary | ICD-10-CM | POA: Diagnosis not present

## 2020-10-12 DIAGNOSIS — M1712 Unilateral primary osteoarthritis, left knee: Principal | ICD-10-CM | POA: Insufficient documentation

## 2020-10-12 DIAGNOSIS — J449 Chronic obstructive pulmonary disease, unspecified: Secondary | ICD-10-CM | POA: Diagnosis not present

## 2020-10-12 DIAGNOSIS — Z7982 Long term (current) use of aspirin: Secondary | ICD-10-CM | POA: Diagnosis not present

## 2020-10-12 DIAGNOSIS — Z96652 Presence of left artificial knee joint: Secondary | ICD-10-CM

## 2020-10-12 DIAGNOSIS — M7989 Other specified soft tissue disorders: Secondary | ICD-10-CM | POA: Diagnosis not present

## 2020-10-12 DIAGNOSIS — Z79899 Other long term (current) drug therapy: Secondary | ICD-10-CM | POA: Diagnosis not present

## 2020-10-12 DIAGNOSIS — E119 Type 2 diabetes mellitus without complications: Secondary | ICD-10-CM | POA: Insufficient documentation

## 2020-10-12 DIAGNOSIS — J45909 Unspecified asthma, uncomplicated: Secondary | ICD-10-CM | POA: Diagnosis not present

## 2020-10-12 DIAGNOSIS — K219 Gastro-esophageal reflux disease without esophagitis: Secondary | ICD-10-CM | POA: Diagnosis not present

## 2020-10-12 HISTORY — PX: TOTAL KNEE ARTHROPLASTY: SHX125

## 2020-10-12 LAB — CREATININE, SERUM
Creatinine, Ser: 0.78 mg/dL (ref 0.44–1.00)
GFR, Estimated: >60 mL/min (ref >60)

## 2020-10-12 LAB — CBC
HCT: 35 % — ABNORMAL LOW (ref 36.0–46.0)
Hemoglobin: 11.4 g/dL — ABNORMAL LOW (ref 12.0–15.0)
MCH: 26.6 pg (ref 26.0–34.0)
MCHC: 32.6 g/dL (ref 30.0–36.0)
MCV: 81.8 fL (ref 80.0–100.0)
Platelets: 269 10*3/uL (ref 150–400)
RBC: 4.28 MIL/uL (ref 3.87–5.11)
RDW: 15.4 % (ref 11.5–15.5)
WBC: 9.4 10*3/uL (ref 4.0–10.5)
nRBC: 0 % (ref 0.0–0.2)

## 2020-10-12 LAB — GLUCOSE, CAPILLARY
Glucose-Capillary: 125 mg/dL — ABNORMAL HIGH (ref 70–99)
Glucose-Capillary: 168 mg/dL — ABNORMAL HIGH (ref 70–99)
Glucose-Capillary: 131 mg/dL — ABNORMAL HIGH (ref 70–99)
Glucose-Capillary: 119 mg/dL — ABNORMAL HIGH (ref 70–99)
Glucose-Capillary: 107 mg/dL — ABNORMAL HIGH (ref 70–99)

## 2020-10-12 SURGERY — ARTHROPLASTY, KNEE, TOTAL
Anesthesia: Spinal | Site: Knee | Laterality: Left

## 2020-10-12 MED ORDER — BISACODYL 10 MG RE SUPP
10.0000 mg | Freq: Every day | RECTAL | Status: DC | PRN
  Filled 2020-10-12: qty 1

## 2020-10-12 MED ORDER — ONDANSETRON HCL 4 MG/2ML IJ SOLN: 4.0000 mg | Freq: Once | INTRAMUSCULAR | Status: DC | PRN

## 2020-10-12 MED ORDER — INSULIN ASPART 100 UNIT/ML IJ SOLN
0.0000 [IU] | Freq: Three times a day (TID) | INTRAMUSCULAR | Status: DC
  Administered 2020-10-13: 2 [IU] via SUBCUTANEOUS
  Filled 2020-10-12: qty 1

## 2020-10-12 MED ORDER — HYDROCODONE-ACETAMINOPHEN 7.5-325 MG PO TABS
1.0000 | ORAL_TABLET | ORAL | Status: DC | PRN
  Administered 2020-10-13: 2 via ORAL
  Filled 2020-10-12 (×2): qty 2

## 2020-10-12 MED ORDER — CHLORHEXIDINE GLUCONATE 0.12 % MT SOLN
OROMUCOSAL | Status: AC
  Administered 2020-10-12: 15 mL via OROMUCOSAL
  Filled 2020-10-12: qty 15

## 2020-10-12 MED ORDER — SIMVASTATIN 20 MG PO TABS
20.0000 mg | ORAL_TABLET | Freq: Every day | ORAL | Status: DC
  Administered 2020-10-12 – 2020-10-13 (×2): 20 mg via ORAL
  Filled 2020-10-12 (×2): qty 1

## 2020-10-12 MED ORDER — OXYCODONE HCL 5 MG PO TABS: 5.0000 mg | ORAL_TABLET | Freq: Once | ORAL | Status: DC | PRN

## 2020-10-12 MED ORDER — ENOXAPARIN SODIUM 30 MG/0.3ML IJ SOSY
30.0000 mg | PREFILLED_SYRINGE | Freq: Two times a day (BID) | INTRAMUSCULAR | Status: DC
  Administered 2020-10-13 – 2020-10-14 (×3): 30 mg via SUBCUTANEOUS
  Filled 2020-10-12 (×3): qty 0.3

## 2020-10-12 MED ORDER — KETOROLAC TROMETHAMINE 15 MG/ML IJ SOLN
15.0000 mg | Freq: Once | INTRAMUSCULAR | Status: AC
  Administered 2020-10-12: 15 mg via INTRAVENOUS
  Filled 2020-10-12: qty 1

## 2020-10-12 MED ORDER — SURGIPHOR WOUND IRRIGATION SYSTEM - OPTIME
TOPICAL | Status: DC | PRN
  Administered 2020-10-12: 1

## 2020-10-12 MED ORDER — PROPOFOL 500 MG/50ML IV EMUL
INTRAVENOUS | Status: DC | PRN
  Administered 2020-10-12: 75 ug/kg/min via INTRAVENOUS
  Administered 2020-10-12: 40 mg via INTRAVENOUS

## 2020-10-12 MED ORDER — AMITRIPTYLINE HCL 25 MG PO TABS
25.0000 mg | ORAL_TABLET | Freq: Every day | ORAL | Status: DC
  Administered 2020-10-12 – 2020-10-13 (×2): 25 mg via ORAL
  Filled 2020-10-12 (×2): qty 1

## 2020-10-12 MED ORDER — PANTOPRAZOLE SODIUM 40 MG PO TBEC
40.0000 mg | DELAYED_RELEASE_TABLET | Freq: Every day | ORAL | Status: DC
  Administered 2020-10-13 – 2020-10-14 (×2): 40 mg via ORAL
  Filled 2020-10-12 (×2): qty 1

## 2020-10-12 MED ORDER — BUPIVACAINE-EPINEPHRINE (PF) 0.25% -1:200000 IJ SOLN
INTRAMUSCULAR | Status: AC
  Filled 2020-10-12: qty 30

## 2020-10-12 MED ORDER — CEFAZOLIN SODIUM-DEXTROSE 2-4 GM/100ML-% IV SOLN
2.0000 g | Freq: Four times a day (QID) | INTRAVENOUS | Status: AC
Start: 2020-10-12 — End: 2020-10-13
  Administered 2020-10-12 (×2): 2 g via INTRAVENOUS
  Filled 2020-10-12 (×2): qty 100

## 2020-10-12 MED ORDER — CEFAZOLIN SODIUM-DEXTROSE 2-4 GM/100ML-% IV SOLN
INTRAVENOUS | Status: AC
  Filled 2020-10-12: qty 100

## 2020-10-12 MED ORDER — PREGABALIN 75 MG PO CAPS
150.0000 mg | ORAL_CAPSULE | Freq: Every morning | ORAL | Status: DC
  Administered 2020-10-13 – 2020-10-14 (×2): 150 mg via ORAL
  Filled 2020-10-12 (×2): qty 2

## 2020-10-12 MED ORDER — ONDANSETRON HCL 4 MG/2ML IJ SOLN
4.0000 mg | Freq: Four times a day (QID) | INTRAMUSCULAR | Status: DC | PRN
  Administered 2020-10-13: 4 mg via INTRAVENOUS
  Filled 2020-10-12: qty 2

## 2020-10-12 MED ORDER — ALBUTEROL SULFATE HFA 108 (90 BASE) MCG/ACT IN AERS
2.0000 | INHALATION_SPRAY | Freq: Four times a day (QID) | RESPIRATORY_TRACT | Status: DC | PRN
  Filled 2020-10-12: qty 6.7

## 2020-10-12 MED ORDER — PROPOFOL 1000 MG/100ML IV EMUL
INTRAVENOUS | Status: AC
  Filled 2020-10-12: qty 100

## 2020-10-12 MED ORDER — HYDROXYZINE HCL 25 MG PO TABS
25.0000 mg | ORAL_TABLET | Freq: Every day | ORAL | Status: DC
  Administered 2020-10-12 – 2020-10-13 (×2): 25 mg via ORAL
  Filled 2020-10-12 (×3): qty 1

## 2020-10-12 MED ORDER — SODIUM CHLORIDE 0.9 % IV SOLN: INTRAVENOUS | Status: DC

## 2020-10-12 MED ORDER — HYDROCHLOROTHIAZIDE 25 MG PO TABS
25.0000 mg | ORAL_TABLET | Freq: Every day | ORAL | Status: DC
  Administered 2020-10-12 – 2020-10-14 (×3): 25 mg via ORAL
  Filled 2020-10-12 (×3): qty 1

## 2020-10-12 MED ORDER — TRAMADOL HCL 50 MG PO TABS
50.0000 mg | ORAL_TABLET | Freq: Four times a day (QID) | ORAL | Status: DC
  Administered 2020-10-12 – 2020-10-14 (×8): 50 mg via ORAL
  Filled 2020-10-12 (×8): qty 1

## 2020-10-12 MED ORDER — ZOLPIDEM TARTRATE 5 MG PO TABS
5.0000 mg | ORAL_TABLET | Freq: Every evening | ORAL | Status: DC | PRN
  Administered 2020-10-13: 5 mg via ORAL
  Filled 2020-10-12: qty 1

## 2020-10-12 MED ORDER — SODIUM CHLORIDE 0.9 % IR SOLN: Status: DC | PRN

## 2020-10-12 MED ORDER — FLEET ENEMA 7-19 GM/118ML RE ENEM: 1.0000 | ENEMA | Freq: Once | RECTAL | Status: DC | PRN

## 2020-10-12 MED ORDER — LINACLOTIDE 145 MCG PO CAPS
145.0000 ug | ORAL_CAPSULE | Freq: Every day | ORAL | Status: DC
  Administered 2020-10-13 – 2020-10-14 (×2): 145 ug via ORAL
  Filled 2020-10-12 (×3): qty 1

## 2020-10-12 MED ORDER — LIDOCAINE HCL (CARDIAC) PF 100 MG/5ML IV SOSY
PREFILLED_SYRINGE | INTRAVENOUS | Status: DC | PRN
  Administered 2020-10-12: 40 mg via INTRAVENOUS

## 2020-10-12 MED ORDER — SENNOSIDES-DOCUSATE SODIUM 8.6-50 MG PO TABS
1.0000 | ORAL_TABLET | Freq: Every evening | ORAL | Status: DC | PRN
  Administered 2020-10-12: 1 via ORAL
  Filled 2020-10-12: qty 1

## 2020-10-12 MED ORDER — PREGABALIN 75 MG PO CAPS: 150.0000 mg | ORAL_CAPSULE | Freq: Two times a day (BID) | ORAL | Status: DC

## 2020-10-12 MED ORDER — MIDAZOLAM HCL 5 MG/5ML IJ SOLN
INTRAMUSCULAR | Status: DC | PRN
  Administered 2020-10-12 (×2): 1 mg via INTRAVENOUS

## 2020-10-12 MED ORDER — PREGABALIN 75 MG PO CAPS
300.0000 mg | ORAL_CAPSULE | Freq: Every evening | ORAL | Status: DC
  Administered 2020-10-12: 300 mg via ORAL
  Filled 2020-10-12: qty 4

## 2020-10-12 MED ORDER — PREGABALIN 75 MG PO CAPS: 150.0000 mg | ORAL_CAPSULE | Freq: Every morning | ORAL | Status: DC

## 2020-10-12 MED ORDER — MORPHINE SULFATE (PF) 10 MG/ML IV SOLN
INTRAVENOUS | Status: AC
  Filled 2020-10-12: qty 1

## 2020-10-12 MED ORDER — MIDAZOLAM HCL 2 MG/2ML IJ SOLN
INTRAMUSCULAR | Status: AC
  Filled 2020-10-12: qty 2

## 2020-10-12 MED ORDER — ASPIRIN EC 81 MG PO TBEC
81.0000 mg | DELAYED_RELEASE_TABLET | Freq: Every day | ORAL | Status: DC
  Administered 2020-10-12 – 2020-10-13 (×2): 81 mg via ORAL
  Filled 2020-10-12 (×2): qty 1

## 2020-10-12 MED ORDER — PREGABALIN 75 MG PO CAPS
300.0000 mg | ORAL_CAPSULE | Freq: Every evening | ORAL | Status: DC
  Administered 2020-10-13: 300 mg via ORAL
  Filled 2020-10-12: qty 4

## 2020-10-12 MED ORDER — METHOCARBAMOL 1000 MG/10ML IJ SOLN
500.0000 mg | Freq: Four times a day (QID) | INTRAVENOUS | Status: DC | PRN
  Filled 2020-10-12 (×2): qty 5

## 2020-10-12 MED ORDER — FLUTICASONE PROPIONATE 50 MCG/ACT NA SUSP
2.0000 | Freq: Every day | NASAL | Status: DC
  Administered 2020-10-12 – 2020-10-13 (×2): 2 via NASAL
  Filled 2020-10-12: qty 16

## 2020-10-12 MED ORDER — SODIUM CHLORIDE FLUSH 0.9 % IV SOLN
INTRAVENOUS | Status: AC
  Filled 2020-10-12: qty 40

## 2020-10-12 MED ORDER — METHOCARBAMOL 500 MG PO TABS
500.0000 mg | ORAL_TABLET | Freq: Four times a day (QID) | ORAL | Status: DC | PRN
  Administered 2020-10-12 – 2020-10-14 (×5): 500 mg via ORAL
  Filled 2020-10-12 (×5): qty 1

## 2020-10-12 MED ORDER — ORAL CARE MOUTH RINSE: 15.0000 mL | Freq: Once | OROMUCOSAL | Status: AC

## 2020-10-12 MED ORDER — SODIUM CHLORIDE (PF) 0.9 % IJ SOLN: INTRAMUSCULAR | Status: DC | PRN

## 2020-10-12 MED ORDER — BUPIVACAINE HCL (PF) 0.5 % IJ SOLN
INTRAMUSCULAR | Status: DC | PRN
  Administered 2020-10-12: 2.5 mL

## 2020-10-12 MED ORDER — CHLORHEXIDINE GLUCONATE 0.12 % MT SOLN: 15.0000 mL | Freq: Once | OROMUCOSAL | Status: AC

## 2020-10-12 MED ORDER — HYDROCODONE-ACETAMINOPHEN 5-325 MG PO TABS
1.0000 | ORAL_TABLET | ORAL | Status: DC | PRN
  Administered 2020-10-13: 2 via ORAL
  Filled 2020-10-12: qty 2

## 2020-10-12 MED ORDER — PHENYLEPHRINE HCL (PRESSORS) 10 MG/ML IV SOLN
INTRAVENOUS | Status: AC
  Filled 2020-10-12: qty 1

## 2020-10-12 MED ORDER — ALUM & MAG HYDROXIDE-SIMETH 200-200-20 MG/5ML PO SUSP
30.0000 mL | ORAL | Status: DC | PRN
Start: 2020-10-12 — End: 2020-10-14

## 2020-10-12 MED ORDER — PREGABALIN 75 MG PO CAPS: 150.0000 mg | ORAL_CAPSULE | ORAL | Status: DC

## 2020-10-12 MED ORDER — DOCUSATE SODIUM 100 MG PO CAPS: 100.0000 mg | ORAL_CAPSULE | Freq: Two times a day (BID) | ORAL | Status: DC

## 2020-10-12 MED ORDER — MENTHOL 3 MG MT LOZG
1.0000 | LOZENGE | OROMUCOSAL | Status: DC | PRN
  Filled 2020-10-12: qty 9

## 2020-10-12 MED ORDER — ACETAMINOPHEN 10 MG/ML IV SOLN
INTRAVENOUS | Status: AC
  Filled 2020-10-12: qty 100

## 2020-10-12 MED ORDER — ROPINIROLE HCL 1 MG PO TABS
4.0000 mg | ORAL_TABLET | Freq: Every day | ORAL | Status: DC
  Administered 2020-10-12 – 2020-10-13 (×2): 4 mg via ORAL
  Filled 2020-10-12 (×2): qty 4

## 2020-10-12 MED ORDER — OXYCODONE HCL ER 15 MG PO T12A
15.0000 mg | EXTENDED_RELEASE_TABLET | Freq: Two times a day (BID) | ORAL | Status: DC
  Administered 2020-10-12 – 2020-10-14 (×4): 15 mg via ORAL
  Filled 2020-10-12 (×4): qty 1

## 2020-10-12 MED ORDER — MORPHINE SULFATE (PF) 2 MG/ML IV SOLN
0.5000 mg | INTRAVENOUS | Status: DC | PRN
  Administered 2020-10-12 – 2020-10-13 (×5): 1 mg via INTRAVENOUS
  Filled 2020-10-12 (×5): qty 1

## 2020-10-12 MED ORDER — DOCUSATE SODIUM 100 MG PO CAPS
100.0000 mg | ORAL_CAPSULE | Freq: Two times a day (BID) | ORAL | Status: DC
  Administered 2020-10-12 – 2020-10-14 (×4): 100 mg via ORAL
  Filled 2020-10-12 (×5): qty 1

## 2020-10-12 MED ORDER — PROPOFOL 10 MG/ML IV BOLUS
INTRAVENOUS | Status: AC
  Filled 2020-10-12: qty 20

## 2020-10-12 MED ORDER — PHENOL 1.4 % MT LIQD
1.0000 | OROMUCOSAL | Status: DC | PRN
  Filled 2020-10-12: qty 177

## 2020-10-12 MED ORDER — NEOMYCIN-POLYMYXIN B GU 40-200000 IR SOLN
Status: AC
  Filled 2020-10-12: qty 20

## 2020-10-12 MED ORDER — OXYCODONE HCL 5 MG/5ML PO SOLN: 5.0000 mg | Freq: Once | ORAL | Status: DC | PRN

## 2020-10-12 MED ORDER — ACETAMINOPHEN 10 MG/ML IV SOLN
1000.0000 mg | Freq: Once | INTRAVENOUS | Status: DC | PRN
  Administered 2020-10-12: 1000 mg via INTRAVENOUS

## 2020-10-12 MED ORDER — FENTANYL CITRATE (PF) 100 MCG/2ML IJ SOLN: 25.0000 ug | INTRAMUSCULAR | Status: DC | PRN

## 2020-10-12 MED ORDER — POLYETHYLENE GLYCOL 3350 17 G PO PACK
1.0000 | PACK | Freq: Every day | ORAL | Status: DC
  Administered 2020-10-13 – 2020-10-14 (×2): 17 g via ORAL
  Filled 2020-10-12 (×2): qty 1

## 2020-10-12 MED ORDER — BUPIVACAINE LIPOSOME 1.3 % IJ SUSP
INTRAMUSCULAR | Status: AC
  Filled 2020-10-12: qty 20

## 2020-10-12 MED ORDER — ACETAMINOPHEN 325 MG PO TABS
325.0000 mg | ORAL_TABLET | Freq: Four times a day (QID) | ORAL | Status: DC | PRN
  Administered 2020-10-13: 650 mg via ORAL
  Filled 2020-10-12: qty 2

## 2020-10-12 MED ORDER — ONDANSETRON HCL 4 MG PO TABS: 4.0000 mg | ORAL_TABLET | Freq: Four times a day (QID) | ORAL | Status: DC | PRN

## 2020-10-12 SURGICAL SUPPLY — 68 items
BLADE SAGITTAL 25.0X1.19X90 (BLADE) ×2 IMPLANT
BLADE SAW 90X13X1.19 OSCILLAT (BLADE) ×2 IMPLANT
BNDG ELASTIC 6X5.8 VLCR STR LF (GAUZE/BANDAGES/DRESSINGS) ×2 IMPLANT
CANISTER SUCT 1200ML W/VALVE (MISCELLANEOUS) ×2 IMPLANT
CANISTER WOUND CARE 500ML ATS (WOUND CARE) ×2 IMPLANT
CEMENT FEMORAL COMP SZ3P LEFT (Femur) ×2 IMPLANT
CEMENT HV SMART SET (Cement) ×4 IMPLANT
CHLORAPREP W/TINT 26 (MISCELLANEOUS) ×4 IMPLANT
COOLER POLAR GLACIER W/PUMP (MISCELLANEOUS) ×2 IMPLANT
CUFF TOURN SGL QUICK 24 (TOURNIQUET CUFF)
CUFF TOURN SGL QUICK 34 (TOURNIQUET CUFF)
CUFF TRNQT CYL 24X4X16.5-23 (TOURNIQUET CUFF) IMPLANT
CUFF TRNQT CYL 34X4.125X (TOURNIQUET CUFF) IMPLANT
DRAPE 3/4 80X56 (DRAPES) ×4 IMPLANT
DRSG MEPILEX SACRM 8.7X9.8 (GAUZE/BANDAGES/DRESSINGS) ×2 IMPLANT
ELECT CAUTERY BLADE 6.4 (BLADE) ×2 IMPLANT
ELECT REM PT RETURN 9FT ADLT (ELECTROSURGICAL) ×2
ELECTRODE REM PT RTRN 9FT ADLT (ELECTROSURGICAL) ×1 IMPLANT
GAUZE 4X4 16PLY ~~LOC~~+RFID DBL (SPONGE) ×2 IMPLANT
GAUZE SPONGE 4X4 12PLY STRL (GAUZE/BANDAGES/DRESSINGS) ×2 IMPLANT
GAUZE XEROFORM 1X8 LF (GAUZE/BANDAGES/DRESSINGS) ×2 IMPLANT
GLOVE SURG ORTHO LTX SZ8 (GLOVE) ×2 IMPLANT
GLOVE SURG SYN 9.0  PF PI (GLOVE) ×1
GLOVE SURG SYN 9.0 PF PI (GLOVE) ×1 IMPLANT
GLOVE SURG UNDER LTX SZ8 (GLOVE) ×2 IMPLANT
GLOVE SURG UNDER POLY LF SZ9 (GLOVE) ×2 IMPLANT
GOWN SRG 2XL LVL 4 RGLN SLV (GOWNS) ×1 IMPLANT
GOWN STRL NON-REIN 2XL LVL4 (GOWNS) ×1
GOWN STRL REUS W/ TWL LRG LVL3 (GOWN DISPOSABLE) ×1 IMPLANT
GOWN STRL REUS W/ TWL XL LVL3 (GOWN DISPOSABLE) ×1 IMPLANT
GOWN STRL REUS W/TWL LRG LVL3 (GOWN DISPOSABLE) ×1
GOWN STRL REUS W/TWL XL LVL3 (GOWN DISPOSABLE) ×1
HOLDER FOLEY CATH W/STRAP (MISCELLANEOUS) ×2 IMPLANT
HOOD PEEL AWAY FLYTE STAYCOOL (MISCELLANEOUS) ×4 IMPLANT
INSERT TIBIAL SZ3 L HT 10M FLX (Insert) ×2 IMPLANT
IRRIGATION SURGIPHOR STRL (IV SOLUTION) ×2 IMPLANT
IV NS IRRIG 3000ML ARTHROMATIC (IV SOLUTION) ×2 IMPLANT
KIT PREVENA INCISION MGT20CM45 (CANNISTER) ×2 IMPLANT
KIT TURNOVER KIT A (KITS) ×2 IMPLANT
MANIFOLD NEPTUNE II (INSTRUMENTS) ×2 IMPLANT
NDL SAFETY ECLIPSE 18X1.5 (NEEDLE) ×1 IMPLANT
NEEDLE HYPO 18GX1.5 SHARP (NEEDLE) ×1
NEEDLE SPNL 18GX3.5 QUINCKE PK (NEEDLE) ×2 IMPLANT
NEEDLE SPNL 20GX3.5 QUINCKE YW (NEEDLE) ×2 IMPLANT
NS IRRIG 1000ML POUR BTL (IV SOLUTION) ×2 IMPLANT
PACK TOTAL KNEE (MISCELLANEOUS) ×2 IMPLANT
PAD WRAPON POLAR KNEE (MISCELLANEOUS) ×1 IMPLANT
PATELLA RESURFACING MEDACTA 02 (Bone Implant) ×2 IMPLANT
PENCIL SMOKE EVACUATOR COATED (MISCELLANEOUS) ×2 IMPLANT
PULSAVAC PLUS IRRIG FAN TIP (DISPOSABLE) ×2
SCALPEL PROTECTED #10 DISP (BLADE) ×4 IMPLANT
SPONGE T-LAP 18X18 ~~LOC~~+RFID (SPONGE) ×6 IMPLANT
STAPLER SKIN PROX 35W (STAPLE) ×2 IMPLANT
STEM EXTENSION 11MMX30MM (Stem) ×2 IMPLANT
SUCTION FRAZIER HANDLE 10FR (MISCELLANEOUS) ×1
SUCTION TUBE FRAZIER 10FR DISP (MISCELLANEOUS) ×1 IMPLANT
SUT DVC 2 QUILL PDO  T11 36X36 (SUTURE) ×1
SUT DVC 2 QUILL PDO T11 36X36 (SUTURE) ×1 IMPLANT
SUT ETHIBOND 2 V 37 (SUTURE) IMPLANT
SUT V-LOC 90 ABS DVC 3-0 CL (SUTURE) ×2 IMPLANT
SYR 20ML LL LF (SYRINGE) ×2 IMPLANT
SYR 50ML LL SCALE MARK (SYRINGE) ×4 IMPLANT
TIBIAL TRAY FIXED MEDACTA 0207 (Bone Implant) ×2 IMPLANT
TIP FAN IRRIG PULSAVAC PLUS (DISPOSABLE) ×1 IMPLANT
TOWEL OR 17X26 4PK STRL BLUE (TOWEL DISPOSABLE) ×2 IMPLANT
TOWER CARTRIDGE SMART MIX (DISPOSABLE) ×2 IMPLANT
TRAY FOLEY MTR SLVR 16FR STAT (SET/KITS/TRAYS/PACK) ×2 IMPLANT
WRAPON POLAR PAD KNEE (MISCELLANEOUS) ×2

## 2020-10-12 NOTE — Anesthesia Procedure Notes (Signed)
Spinal  Patient location during procedure: OR Start time: 10/12/2020 7:18 AM End time: 10/12/2020 7:24 AM Reason for block: surgical anesthesia Staffing Performed: resident/CRNA  Resident/CRNA: Rona Ravens, CRNA Preanesthetic Checklist Completed: patient identified, IV checked, site marked, risks and benefits discussed, surgical consent, monitors and equipment checked, pre-op evaluation and timeout performed Spinal Block Patient position: sitting Prep: Betadine Patient monitoring: heart rate, continuous pulse ox, blood pressure and cardiac monitor Approach: midline Location: L4-5 Injection technique: single-shot Needle Needle type: [place] and Introducer  Needle gauge: 24 G Needle length: 9 cm Assessment Events: CSF return Additional Notes Negative paresthesia. Negative blood return. Positive free-flowing CSF. Expiration date of kit checked and confirmed. Patient tolerated procedure well, without complications.

## 2020-10-12 NOTE — Evaluation (Signed)
Physical Therapy Evaluation Patient Details Name: Erin Petersen MRN: [id number] DOB: [birth year] Today's Date: 10/12/2020   History of Present Illness  Pt is a 80 y.o. female s/p L TKA secondary OA.  PMH includes R TKA, SOB, sleep apnea (CPAP), htn, pulmonary htn, dysrhythmias, DM, neuropathy R leg and foot, plantar fasciitis, RLS, urinary incontinence, HOH (hearing aid R ear), lumbar fusion, cardiac cath, hernia repair.  Clinical Impression  Prior to hospital admission, pt was independent with ambulation; lives alone in 1 level home with 1 small step to enter; pt's sister is planning on staying with pt on hospital discharge to assist as needed.  Therapist received message from pt's nurse regarding pt having a lot of pain and muscle spasms (already received medications) to see if physical therapy could help with pt's comfort.  Pt resting in bed upon PT arrival and reporting having intermittent "charlie horses" in L LE (which was noted beginning of session's activities) and pt very agreeable to trying ex's and mobility to assist with pain/cramping.  During session pt min assist for bed mobility; CGA with transfers; and CGA to ambulate a few feet bed to/from recliner with youth sized RW (pt declined to stay in recliner d/t wanting to take a nap in bed to rest).  With continued mobility during session pt stopped having L LE muscle spasms/cramping but still had 6-7/10 L knee pain (was 6/10 at rest beginning of session): nurse notified who reported she was coming to give pt more pain medication.  Pt had nausea after getting back to bed but resolved with rest in bed.  Pt would benefit from skilled PT to address noted impairments and functional limitations (see below for any additional details).  Upon hospital discharge, pt would benefit from [place].    Follow Up Recommendations Home health PT;Supervision for mobility/OOB    Equipment Recommendations  Rolling walker with 5" wheels;3in1 (PT) (pt has youth  sized RW and BSC at home already)    Recommendations for Other Services       Precautions / Restrictions Precautions Precautions: Fall;Knee Precaution Booklet Issued: Yes (comment) Precaution Comments: wound vac Restrictions Weight Bearing Restrictions: Yes LLE Weight Bearing: Weight bearing as tolerated      Mobility  Bed Mobility Overal bed mobility: Needs Assistance Bed Mobility: Supine to Sit;Sit to Supine     Supine to sit: Min assist;HOB elevated Sit to supine: Min assist;HOB elevated   General bed mobility comments: assist for L LE; vc's for technique; use of bed rails    Transfers Overall transfer level: Needs assistance Equipment used: Rolling walker (2 wheeled) (youth sized) Transfers: Sit to/from Stand Sit to Stand: Min guard         General transfer comment: x1 trial standing from bed and x1 trial standing from recliner; vc's for UE/LE placement and overall technique  Ambulation/Gait Ambulation/Gait assistance: Min guard Gait Distance (Feet):  (3 feet x2 (bed to/from) recliner) Assistive device: Rolling walker (2 wheeled) (youth sized) Gait Pattern/deviations: Antalgic;Step-to pattern Gait velocity: decreased   General Gait Details: decreased stance time L LE; increased UE support noted through RW to offweight L LE when advancing R LE  Stairs            Wheelchair Mobility    Modified Rankin (Stroke Patients Only)       Balance Overall balance assessment: Needs assistance Sitting-balance support: No upper extremity supported;Feet supported Sitting balance-Leahy Scale: Good Sitting balance - Comments: steady sitting reaching within BOS   Standing balance support:  Single extremity supported Standing balance-Leahy Scale: Fair Standing balance comment: steady standing with at least single UE support on walker                             Pertinent Vitals/Pain Pain Assessment: 0-10 Pain Score: 7  Pain Location: L knee Pain  Descriptors / Indicators: Aching;Tender;Sore;Grimacing;Guarding Pain Intervention(s): Limited activity within patient's tolerance;Monitored during session;Premedicated before session;Repositioned;Patient requesting pain meds-RN notified;Other (comment) (polar care applied and activated) Vitals (HR and O2 on room air) stable and WFL throughout treatment session.    Home Living Family/patient expects to be discharged to:: Private residence Living Arrangements: Alone Available Help at Discharge: Family (sister coming to stay with pt to assist as needed) Type of Home: House Home Access: Stairs to enter Entrance Stairs-Rails: None Entrance Stairs-Number of Steps: 1 small step Home Layout: One level Home Equipment: Grab bars - tub/shower;Walker - 2 wheels;Cane - single point;Bedside commode      Prior Function Level of Independence: Independent               Hand Dominance        Extremity/Trunk Assessment   Upper Extremity Assessment Upper Extremity Assessment: Overall WFL for tasks assessed    Lower Extremity Assessment Lower Extremity Assessment: LLE deficits/detail (R LE WFL) LLE Deficits / Details: fair L quad set (isometric) strength; at least 3/5 AROM ankle DF/PF LLE: Unable to fully assess due to pain    Cervical / Trunk Assessment Cervical / Trunk Assessment: Normal  Communication   Communication: HOH (did not bring B hearing aides)  Cognition Arousal/Alertness: Awake/alert Behavior During Therapy: WFL for tasks assessed/performed Overall Cognitive Status: Within Functional Limits for tasks assessed                                        General Comments General comments (skin integrity, edema, etc.): L LE wound vac.  Nursing cleared pt for participation in physical therapy.  Pt agreeable to PT session.  [name] left beginning of PT session.    Exercises Total Joint Exercises Ankle Circles/Pumps: AROM;Strengthening;Both;10  reps;Supine Quad Sets: AROM;Strengthening;Both;10 reps;Supine Heel Slides: AAROM;Strengthening;Left;10 reps;Supine Goniometric ROM: L knee extension 10 degrees short of neutral semi-supine in bed; L knee flexion AROM 80 degrees sitting in recliner   Assessment/Plan    PT Assessment Patient needs continued PT services  PT Problem List Decreased strength;Decreased range of motion;Decreased activity tolerance;Decreased balance;Decreased mobility;Decreased knowledge of use of DME;Decreased knowledge of precautions;Pain;Decreased skin integrity       PT Treatment Interventions DME instruction;Gait training;Stair training;Functional mobility training;Therapeutic activities;Therapeutic exercise;Balance training;Patient/family education    PT Goals (Current goals can be found in the Care Plan section)  Acute Rehab PT Goals Patient Stated Goal: to go home tomorrow PT Goal Formulation: With patient Time For Goal Achievement: 10/26/20 Potential to Achieve Goals: Good    Frequency BID   Barriers to discharge        Co-evaluation               AM-PAC PT "6 Clicks" Mobility  Outcome Measure Help needed turning from your back to your side while in a flat bed without using bedrails?: None Help needed moving from lying on your back to sitting on the side of a flat bed without using bedrails?: A Little Help needed moving to and from a bed  to a chair (including a wheelchair)?: A Little Help needed standing up from a chair using your arms (e.g., wheelchair or bedside chair)?: A Little Help needed to walk in hospital room?: A Little Help needed climbing 3-5 steps with a railing? : A Little 6 Click Score: 19    End of Session Equipment Utilized During Treatment: Gait belt Activity Tolerance: Patient limited by pain Patient left: in bed;with call bell/phone within reach;with bed alarm set;with SCD's reapplied;Other (comment) (B heels floating via towel rolls (pt unable to tolerate bone  foam); polar care in place and activated) Nurse Communication: Mobility status;Patient requests pain meds;Precautions;Weight bearing status PT Visit Diagnosis: Other abnormalities of gait and mobility (R26.89);Muscle weakness (generalized) (M62.81);Difficulty in walking, not elsewhere classified (R26.2);Pain Pain - Right/Left: Left Pain - part of body: Knee    Time: 1323-1409 PT Time Calculation (min) (ACUTE ONLY): 46 min   Charges:   PT Evaluation $PT Eval Low Complexity: 1 Low PT Treatments $Therapeutic Exercise: 8-22 mins $Therapeutic Activity: 8-22 mins       Leitha Bleak, PT 10/12/20, 2:42 PM

## 2020-10-12 NOTE — H&P (Signed)
Chief Complaint  Patient presents with   Pre-op Exam  Left TKA scheduled 10/12/20   Erin Petersen is a 80 y.o. female who presents today for history and physical for left total knee arthroplasty with Dr. Hessie Knows on 10/12/2020. Patient's had progressive left knee pain for years. She has been taken meloxicam with very little relief. Left knee will throb and ache along the medial joint line and is worse with weightbearing activities. She has had x-ray showing arthritic changes in all 3 compartments with MRI showing more severe cartilage loss throughout the knee. She has underwent a successful right total knee arthroplasty last year and she is ready to undergo a left total knee arthroplasty. She has had cortisone injections with no relief. Pain interferes with quality of life and activities daily living.  Past Medical History: Past Medical History:  Diagnosis Date   Allergy 1959  Post nasal drip   Arthritis 1984   Asthma 01/31/2019   COPD (chronic obstructive pulmonary disease) (CMS-HCC) 12/29/2016   GERD (gastroesophageal reflux disease) 10/22/2019   History of blood transfusion 2002  Fusion surgery   Hypertension 25+ years  Amlodopine & HZTZ   Neuro-degenerative disorders (CMS-HCC) 2002   Sleep apnea 2009  Cap   Past Surgical History: Past Surgical History:  Procedure Laterality Date   ARTHROPLASTY TOTAL KNEE Right 01/01/2020  Dr. Rudene Christians   HERNIA REPAIR   HYSTERECTOMY   JOINT REPLACEMENT   OOPHORECTOMY   SPINE SURGERY 2002  Fusion   TONSILLECTOMY 1961   Past Family History: Family History  Problem Relation Age of Onset   Cancer Mother  Died of lung cancer. Heavy smoker 2nd hand smoke   Cancer Father  Died of brain cancer heavy smoker   Medications: Current Outpatient Medications Ordered in Epic  Medication Sig Dispense Refill   acetaminophen (TYLENOL) 650 MG ER tablet Take by mouth   acyclovir (ZOVIRAX) 5 % cream Apply 1 Application topically every 4 (four) hours as  needed (patient uses 30g as needed.)   amitriptyline (ELAVIL) 25 MG tablet Take 1 tablet by mouth once daily   amLODIPine (NORVASC) 5 MG tablet Take 5 mg by mouth once daily   aspirin 81 MG EC tablet Take by mouth Take 81 mg by mouth   cephalexin (KEFLEX) 500 MG capsule Take 4 pills one hour prior to dental appointment. 12 capsule 1   cholecalciferol (VITAMIN D3) 2,000 unit tablet Take by mouth Take 2,000 Units by mouth daily   cyanocobalamin (VITAMIN B12) 1000 MCG tablet Take by mouth Take 1,000 mcg by mouth.   dicyclomine (BENTYL) 10 mg capsule Take 10 mg by mouth nightly   docusate (COLACE) 100 MG capsule Take 100 mg by mouth 2 (two) times daily   EPINEPHrine (EPIPEN) 0.3 mg/0.3 mL pen injector Inject 0.3 mg into the muscle once as needed for Anaphylaxis   estradioL (ESTRACE) 0.01 % (0.1 mg/gram) vaginal cream Place 2 g vaginally once daily   fluticasone propionate (FLONASE) 50 mcg/actuation nasal spray 1 spray by Nasal route   hydroCHLOROthiazide (HYDRODIURIL) 25 MG tablet Take 25 mg by mouth once daily   hydrOXYzine (ATARAX) 25 MG tablet 1po one hour before sleep   linaCLOtide (LINZESS) 145 mcg capsule Take 145 mcg by mouth once daily   melatonin 5 mg Cap Use   meloxicam (MOBIC) 15 MG tablet Take 1 tablet by mouth once daily   omeprazole (PRILOSEC) 40 MG DR capsule Take 40 mg by mouth once daily 60 capsule 11   polyethylene  glycol (MIRALAX) powder Take 17 g by mouth once daily   pregabalin (LYRICA) 150 MG capsule Take 1 capsule (150 mg total) by mouth 3 (three) times daily Take 150 in the morning and 300 mg nightly 270 capsule 3   pregabalin (LYRICA) 300 MG capsule Take 1 capsule (300 mg total) by mouth nightly Take 300 mg nightly along with 150 mg daily 90 capsule 3   rOPINIRole (REQUIP) 1 MG tablet Take 1 mg by mouth 2 (two) times daily   rOPINIRole (REQUIP) 4 MG tablet Take 4 mg by mouth 3 (three) times daily Take 1 tablet at bedtime   simvastatin (ZOCOR) 20 MG tablet Take 20 mg by  mouth nightly   UNABLE TO FIND 6 g [place]. Patient uses 6g as needed   No current Epic-ordered facility-administered medications on file.   Allergies: Allergies  Allergen Reactions   Other Anaphylaxis  Bee Sting   Ciprofloxacin Rash   Venom-Honey Bee Hives   Sulfa (Sulfonamide Antibiotics) Rash  Bactrim Bactrim, Cipro    Review of Systems:  A comprehensive 14 point ROS was performed, reviewed by me today, and the pertinent orthopaedic findings are documented in the HPI.  Exam: BP 138/72  Wt 78 kg (172 lb)  LMP (LMP Unknown)  BMI 34.74 kg/m  General:  Well developed, well nourished, no apparent distress, normal affect, antalgic gait with no assistive devices  HEENT: Head normocephalic, atraumatic, PERRL.   Abdomen: Soft, non tender, non distended, Bowel sounds present.  Heart: Examination of the heart reveals regular, rate, and rhythm. There is no murmur noted on ascultation. There is a normal apical pulse.  Lungs: Lungs are clear to auscultation. There is no wheeze, rhonchi, or crackles. There is normal expansion of bilateral chest walls.   Left lower Extremities: Examination of the left lower extremity reveals no bony abnormality, no edema, no effusion and no ecchymosis. There is no valgus or varus abnormality. The patient is non-tender along the lateral joint line, and is point tender along the medial joint line. The patient has full knee flexion and extension, 0 to 115 degrees. There is no discomfort with range of motion exercises. The patient has a negative rotational Mcmurray test. There is patellofemoral crepitus. The patient has a negative patella stretch test. The patient has a negative varus stress test and a negative valgus stress test, in looking for stability. The patient has a negative Lachman's test.  Vascular: The patient has a negative Bevelyn Buckles' test bilaterally. The patient had a normal dorsalis pedis and posterior tibial pulse. There is normal skin  warmth. There is normal capillary refill bilaterally.   Neurologic: The patient has a negative straight leg raise. The patient has normal muscle strength testing for the quadriceps, calves, ankle dorsiflexion, ankle plantarflexion, and extensor hallicus longus. The patient has sensation that is intact to light touch. The deep tendon reflexes are normal at the patella and achilles. No clonus is noted.   AP lateral sunrise views of the left knee are reviewed by me today from 07/07/2020. Impression: X-rays show tricompartmental osteoarthritis. Degenerative changes most severe in the medial compartment. Spurring noted along the lateral femoral condyle.  MRI of the left knee shows advanced cartilage loss.  Impression: Primary osteoarthritis of left knee [M17.12] Primary osteoarthritis of left knee (primary encounter diagnosis)  Plan:  26. 80 year old female with tricompartmental left knee osteoarthritis. No relief with conservative treatment. Pain is interfering with quality of life and activities daily living. She is failed conservative treatment with NSAIDs,  intra-articular injections. Risks, benefits, complications of a left total knee arthroplasty have been discussed with the patient. Patient has agreed and consent procedure with Dr. Hessie Knows on 10/12/2020  This note was generated in part with voice recognition software and I apologize for any typographical errors that were not detected and corrected.  Feliberto Gottron MPA-C   Electronically signed by Feliberto Gottron, [place] at 10/06/2020 10:27 AM EDT Reviewed  H+P. No changes noted.

## 2020-10-12 NOTE — Anesthesia Preprocedure Evaluation (Addendum)
Anesthesia Evaluation  Patient identified by MRN, date of birth, ID band Patient awake    Reviewed: Allergy & Precautions, H&P , NPO status , Patient's Chart, lab work & pertinent test results  History of Anesthesia Complications (+) AWARENESS UNDER ANESTHESIA and history of anesthetic complications  Airway Mallampati: II  TM Distance: <3 FB Neck ROM: limited    Dental  (+) Chipped, Dental Advidsory Given   Pulmonary shortness of breath, asthma , sleep apnea and Continuous Positive Airway Pressure Ventilation , neg recent URI,    Pulmonary exam normal breath sounds clear to auscultation       Cardiovascular Exercise Tolerance: Good hypertension, pulmonary hypertension(-) angina(-) Past MI and (-) Cardiac Stents Normal cardiovascular exam+ dysrhythmias (long QT) (-) Valvular Problems/Murmurs Rhythm:Regular Rate:Normal  TTE 2021 1. Left ventricular ejection fraction, by estimation, is 55 to 60%. The  left ventricle has normal function. The left ventricle has no regional  wall motion abnormalities. Left ventricular diastolic parameters were  normal.  2. Right ventricular systolic function is normal. The right ventricular  size is normal. There is moderately elevated pulmonary artery systolic  pressure.  3. Left atrial size was mildly dilated.  4. The mitral valve is normal in structure. No evidence of mitral valve  regurgitation. No evidence of mitral stenosis.  5. The aortic valve is normal in structure. Aortic valve regurgitation is  not visualized. No aortic stenosis is present.  6. The inferior vena cava is dilated in size with >50% respiratory  variability, suggesting right atrial pressure of 8 mmHg.    Neuro/Psych neg Seizures  Neuromuscular disease negative psych ROS   GI/Hepatic Neg liver ROS, PUD, GERD  Medicated and Controlled,  Endo/Other  negative endocrine ROSdiabetes  Renal/GU negative Renal ROS  negative  genitourinary   Musculoskeletal  (+) Arthritis ,   Abdominal   Peds  Hematology negative hematology ROS (+)   Anesthesia Other Findings Past Medical History: No date: Arthritis No date: Asthma     Comment:  exercise induced asthma No date: Colon polyps No date: Dyspnea No date: GERD (gastroesophageal reflux disease) No date: Hyperlipidemia No date: Hypertension No date: Neuropathy     Comment:  right leg and foot No date: Plantar fasciitis No date: Prediabetes No date: RLS (restless legs syndrome) No date: Sleep apnea     Comment:  CPAP No date: SOB (shortness of breath) No date: Urinary incontinence No date: Urinary tract infection No date: Wears hearing aid in right ear  Past Surgical History: 1972: ABDOMINAL HYSTERECTOMY 1958: APPENDECTOMY No date: BACK SURGERY     Comment:  lumbar fusion 01/07/2018: BILATERAL SALPINGOOPHORECTOMY 04/15/2019: BIOPSY     Comment:  Procedure: BIOPSY;  Surgeon: [name], MD;                Location: [facility];  Service: Endoscopy;; No date: CARDIAC CATHETERIZATION     Comment:  over 10 yrs ago.  [place], [place] 11/11/2018: COLONOSCOPY WITH PROPOFOL; N/A     Comment:  Procedure: COLONOSCOPY WITH PROPOFOL;  Surgeon: [name], MD;  Location: [facility];  Service:               Gastroenterology;  Laterality: N/A; 12/30/2018: CYSTO WITH HYDRODISTENSION; N/A     Comment:  Procedure: CYSTOSCOPY/HYDRODISTENSION WITH MARCAINE;                Surgeon: Matilde Sprang,  Nicki Reaper, MD;  Location: [facility];                Service: Urology;  Laterality: N/A; 04/15/2019: ESOPHAGOGASTRODUODENOSCOPY (EGD) WITH PROPOFOL; N/A     Comment:  Procedure: ESOPHAGOGASTRODUODENOSCOPY (EGD) WITH               PROPOFOL;  Surgeon: [name], MD;  Location:               [facility];  Service: Endoscopy;  Laterality:               N/A;  sleep apnea No date: HERNIA REPAIR     Comment:   abd No date: HERNIA REPAIR     Comment:  umbilical No date: INGUINAL HERNIA REPAIR; Bilateral     Comment:  inguinal No date: LAPAROSCOPIC BILATERAL SALPINGO OOPHERECTOMY 2002: SPINAL FUSION 1961: TONSILLECTOMY AND ADENOIDECTOMY  BMI    Body Mass Index: 31.71 kg/m      Reproductive/Obstetrics negative OB ROS                            Anesthesia Physical  Anesthesia Plan  ASA: 3  Anesthesia Plan: Spinal   Post-op Pain Management:    Induction: Intravenous  PONV Risk Score and Plan: 3 and Propofol infusion, TIVA, Ondansetron, Dexamethasone and Midazolam  Airway Management Planned: Natural Airway and Nasal Cannula  Additional Equipment: None  Intra-op Plan:   Post-operative Plan:   Informed Consent: I have reviewed the patients History and Physical, chart, labs and discussed the procedure including the risks, benefits and alternatives for the proposed anesthesia with the patient or authorized representative who has indicated his/her understanding and acceptance.     Dental Advisory Given  Plan Discussed with: Anesthesiologist, CRNA and Surgeon  Anesthesia Plan Comments: (Discussed R/B/A of neuraxial anesthesia technique with patient: - rare risks of spinal/epidural hematoma, nerve damage, infection - Risk of PDPH - Risk of nausea and vomiting - Risk of conversion to general anesthesia and its associated risks, including sore throat, damage to lips/teeth/oropharynx, and rare risks such as cardiac and respiratory events. - Risk of allergic reactions  Patient voiced understanding.)       Anesthesia Quick Evaluation

## 2020-10-12 NOTE — Transfer of Care (Signed)
Immediate Anesthesia Transfer of Care Note  Patient: Erin Petersen  Procedure(s) Performed: TOTAL KNEE ARTHROPLASTY (Left: Knee)  Patient Location: PACU  Anesthesia Type:Spinal  Level of Consciousness: awake and alert   Airway & Oxygen Therapy: Patient Spontanous Breathing and Patient connected to nasal cannula oxygen  Post-op Assessment: Report given to RN and Post -op Vital signs reviewed and stable  Post vital signs: Reviewed and stable  Last Vitals:  Vitals Value Taken Time  BP 128/54 10/12/20 0908  Temp    Pulse 96 10/12/20 0912  Resp 22 10/12/20 0912  SpO2 93 % 10/12/20 0912  Vitals shown include unvalidated device data.  Last Pain:  Vitals:   10/12/20 0627  PainSc: 3          Complications: No notable events documented.

## 2020-10-12 NOTE — Op Note (Signed)
10/12/2020  9:10 AM  PATIENT:  Erin Petersen   MRN: [id number]  PRE-OPERATIVE DIAGNOSIS:  Primary localized osteoarthritis of left knee   POST-OPERATIVE DIAGNOSIS:  Same   PROCEDURE:  Procedure(s): Left TOTAL KNEE ARTHROPLASTY   SURGEON: Laurene Footman, MD   ASSISTANTS: [name], PA-C   ANESTHESIA:   spinal   EBL: 100   BLOOD ADMINISTERED:none   DRAINS:  Incisional wound VAC     LOCAL MEDICATIONS USED:  MARCAINE    and OTHER morphine and Exparel   SPECIMEN:  No Specimen   DISPOSITION OF SPECIMEN:  N/A   COUNTS:  YES   TOURNIQUET: 56 minutes at 300 mm Hg   IMPLANTS: Medacta  GMK sphere system with 3 left plus femur, 3 left tibia with short stem and 10 mm insert.  Size 2 patella, all components cemented.   DICTATION: Viviann Spare Dictation   patient was brought to the operating room and spinal anesthesia was obtained.  After prepping and draping the left leg in sterile fashion, and after patient identification and timeout procedures were completed, tourniquet was raised  and midline skin incision was made followed by medial parapatellar arthrotomy with advanced medial compartment osteoarthritis, moderate patellofemoral arthritis and moderate lateral compartment arthritis, partial synovectomy was also carried out.   The ACL and PCL and fat pad were excised along with anterior horns of the meniscus. The proximal tibia cutting guide from  the extra medullary alignment guide  was applied and the proximal tibia cut carried out.  The distal femoral cut was carried out in a similar fashion     The intramedullary femoral cutting guide applied with distal cut first then the anterior posterior and chamfer cuts made.  The posterior horns of the menisci were removed at this point.   Injection of the above medication was carried out after the femoral and tibial cuts were carried out.  The 3 baseplate trial was placed pinned into position and proximal tibial preparation carried out with  drilling hand reaming and the keel punch followed by placement of the 3+ femur and sizing the tibial insert size 10 millimeter gave the best fit with stability and full extension.  The distal femoral drill holes were made in the notch cut for the trochlear groove was then carried out with trials were then removed the patella was cut using the patellar cutting guide and it sized to a size 2 after drill holes have been made  The knee was irrigated with pulsatile lavage and the bony surfaces dried the tibial component was cemented into place first.  Excess cement was removed and the polyethylene insert placed with a torque screw placed with a torque screwdriver tightened.  The distal femoral component was placed and the knee was held in extension as the patellar button was clamped into place.  After the cement was set, excess cement was removed and the knee was again irrigated thoroughly thoroughly irrigated.  The tourniquet was let down and hemostasis checked with electrocautery. The arthrotomy was repaired with a heavy Quill suture,  followed by 3-0 V lock subcuticular closure, skin staples followed by incisional wound VAC and Polar Care.Marland Kitchen   PLAN OF CARE: Admit for overnight observation   PATIENT DISPOSITION:  PACU - hemodynamically stable.

## 2020-10-13 DIAGNOSIS — E119 Type 2 diabetes mellitus without complications: Secondary | ICD-10-CM | POA: Diagnosis not present

## 2020-10-13 DIAGNOSIS — Z79899 Other long term (current) drug therapy: Secondary | ICD-10-CM | POA: Diagnosis not present

## 2020-10-13 DIAGNOSIS — Z7982 Long term (current) use of aspirin: Secondary | ICD-10-CM | POA: Diagnosis not present

## 2020-10-13 DIAGNOSIS — I10 Essential (primary) hypertension: Secondary | ICD-10-CM | POA: Diagnosis not present

## 2020-10-13 DIAGNOSIS — Z85828 Personal history of other malignant neoplasm of skin: Secondary | ICD-10-CM | POA: Diagnosis not present

## 2020-10-13 DIAGNOSIS — M1712 Unilateral primary osteoarthritis, left knee: Secondary | ICD-10-CM | POA: Diagnosis not present

## 2020-10-13 DIAGNOSIS — J449 Chronic obstructive pulmonary disease, unspecified: Secondary | ICD-10-CM | POA: Diagnosis not present

## 2020-10-13 DIAGNOSIS — J45909 Unspecified asthma, uncomplicated: Secondary | ICD-10-CM | POA: Diagnosis not present

## 2020-10-13 LAB — CBC
HCT: 31.6 % — ABNORMAL LOW (ref 36.0–46.0)
Hemoglobin: 10.4 g/dL — ABNORMAL LOW (ref 12.0–15.0)
MCH: 26.5 pg (ref 26.0–34.0)
MCHC: 32.9 g/dL (ref 30.0–36.0)
MCV: 80.6 fL (ref 80.0–100.0)
Platelets: 261 10*3/uL (ref 150–400)
RBC: 3.92 MIL/uL (ref 3.87–5.11)
RDW: 15.5 % (ref 11.5–15.5)
WBC: 10.7 10*3/uL — ABNORMAL HIGH (ref 4.0–10.5)
nRBC: 0 % (ref 0.0–0.2)

## 2020-10-13 LAB — BASIC METABOLIC PANEL
Anion gap: 7 (ref 5–15)
BUN: 11 mg/dL (ref 8–23)
CO2: 27 mmol/L (ref 22–32)
Calcium: 8.2 mg/dL — ABNORMAL LOW (ref 8.9–10.3)
Chloride: 97 mmol/L — ABNORMAL LOW (ref 98–111)
Creatinine, Ser: 0.73 mg/dL (ref 0.44–1.00)
GFR, Estimated: >60 mL/min (ref >60)
Glucose, Bld: 145 mg/dL — ABNORMAL HIGH (ref 70–99)
Potassium: 3.4 mmol/L — ABNORMAL LOW (ref 3.5–5.1)
Sodium: 131 mmol/L — ABNORMAL LOW (ref 135–145)

## 2020-10-13 LAB — GLUCOSE, CAPILLARY
Glucose-Capillary: 112 mg/dL — ABNORMAL HIGH (ref 70–99)
Glucose-Capillary: 130 mg/dL — ABNORMAL HIGH (ref 70–99)
Glucose-Capillary: 103 mg/dL — ABNORMAL HIGH (ref 70–99)
Glucose-Capillary: 112 mg/dL — ABNORMAL HIGH (ref 70–99)

## 2020-10-13 MED ORDER — CEPHALEXIN 500 MG PO CAPS
500.0000 mg | ORAL_CAPSULE | Freq: Three times a day (TID) | ORAL | Status: DC
  Administered 2020-10-13 – 2020-10-14 (×5): 500 mg via ORAL
  Filled 2020-10-13 (×5): qty 1

## 2020-10-13 NOTE — Anesthesia Postprocedure Evaluation (Signed)
Anesthesia Post Note  Patient: Erin Petersen  Procedure(s) Performed: TOTAL KNEE ARTHROPLASTY (Left: Knee)  Patient location during evaluation: Nursing Unit Anesthesia Type: Spinal Level of consciousness: awake, awake and alert and oriented Pain management: pain level controlled Vital Signs Assessment: post-procedure vital signs reviewed and stable Respiratory status: spontaneous breathing, respiratory function stable and nonlabored ventilation Cardiovascular status: stable Postop Assessment: no headache, patient able to bend at knees, adequate PO intake and no apparent nausea or vomiting Anesthetic complications: no Comments: 10/13/20 No problem with Anesthesia on Op day.  Pt and RN noted that Pt had redness on operative foot around great toe L foot.  Pt had nail trimmed secondary to being ingrown on medial side of toe.  This CRNA instructed RN and Pt to call [name] PA and let him determine further care and instructions.  [name]   No notable events documented.   Last Vitals:  Vitals:   10/13/20 0418 10/13/20 0806  BP:  (!) 146/64  Pulse: 81 78  Resp: 16 16  Temp: 37.7 C 37.1 C  SpO2:  97%    Last Pain:  Vitals:   10/13/20 0835  TempSrc:   PainSc: 8                  [name],  [name] R

## 2020-10-13 NOTE — Progress Notes (Signed)
Physical Therapy Treatment Patient Details Name: Erin Petersen MRN: [id number] DOB: [birth year] Today's Date: 10/13/2020    History of Present Illness Pt is a 80 y.o. female s/p L TKA secondary OA.  PMH includes R TKA, SOB, sleep apnea (CPAP), htn, pulmonary htn, dysrhythmias, DM, neuropathy R leg and foot, plantar fasciitis, RLS, urinary incontinence, HOH (hearing aid R ear), lumbar fusion, cardiac cath, hernia repair.    PT Comments    Pt was asleep in supine upon arriving. She easily awakes and was cooperative throughout. [name] is getting to town tonight and planning to assist her at DC. She continues to be lethargic however more alert than previously observed. After lengthy discussion with pt, she states," I've been going to MD about having a sleep disorder." She was able to exit bed, stand to RW, and ambulate with little to no assistance required. See below for assistance required during functional task. She tolerated gait training 50 ft. Will progress gait distances in next session. Once returned to bed, pt performed ROM and there ex. AROM 5-100 degrees. She did require explanation about need to work on getting full knee extension. Overall pt is progressing well towards acute goals. Recommend DC home with sister + HH PT.    Follow Up Recommendations  Home health PT;Supervision for mobility/OOB;Other (comment);Supervision/Assistance - 24 hour (sister coming to stay with her at DC)     Equipment Recommendations  Rolling walker with 5" wheels;3in1 (PT)       Precautions / Restrictions Precautions Precautions: Fall;Knee Precaution Booklet Issued: Yes (comment) Precaution Comments: wound vac Restrictions Weight Bearing Restrictions: Yes LLE Weight Bearing: Weight bearing as tolerated    Mobility  Bed Mobility Overal bed mobility: Needs Assistance Bed Mobility: Supine to Sit     Supine to sit: Supervision;HOB elevated Sit to supine: Min assist;HOB elevated         Transfers Overall transfer level: Needs assistance Equipment used: Rolling walker (2 wheeled) Transfers: Sit to/from Stand Sit to Stand: Supervision            Ambulation/Gait Ambulation/Gait assistance: Min Gaffer (Feet): 50 Feet Assistive device: Rolling walker (2 wheeled) Gait Pattern/deviations: Antalgic;Step-to pattern Gait velocity: decreased   General Gait Details: pt was able to advance gait distances to 50 ft with RW. no LOB however limited by pain/fatigue    Balance Overall balance assessment: Needs assistance Sitting-balance support: No upper extremity supported;Feet supported Sitting balance-Leahy Scale: Good     Standing balance support: Bilateral upper extremity supported;During functional activity Standing balance-Leahy Scale: Good Standing balance comment: no LOB with BUE support throughout      Cognition Arousal/Alertness: Lethargic;Suspect due to medications (medical history of sleeping disorder pending work up) Behavior During Therapy: WFL for tasks assessed/performed Overall Cognitive Status: Within Functional Limits for tasks assessed       Exercises Total Joint Exercises Ankle Circles/Pumps: AROM;Strengthening;Both;10 reps;Supine Quad Sets: AROM;Strengthening;Both;10 reps;Supine Heel Slides: AROM;10 reps Hip ABduction/ADduction: Strengthening;Left;10 reps;Supine;AROM Straight Leg Raises: AAROM;10 reps Goniometric ROM: 5-100 degrees (reviewed importance of knee extension)        Pertinent Vitals/Pain Pain Assessment: 0-10 Pain Score: 6  Pain Location: L knee Pain Descriptors / Indicators: Aching;Sore;Tender Pain Intervention(s): Limited activity within patient's tolerance;Monitored during session;Premedicated before session;Repositioned;Ice applied     PT Goals (current goals can now be found in the care plan section) Acute Rehab PT Goals Patient Stated Goal: to go home today Progress towards PT goals:  Progressing toward goals    Frequency  BID      PT Plan Current plan remains appropriate       AM-PAC PT "6 Clicks" Mobility   Outcome Measure  Help needed turning from your back to your side while in a flat bed without using bedrails?: None Help needed moving from lying on your back to sitting on the side of a flat bed without using bedrails?: A Little Help needed moving to and from a bed to a chair (including a wheelchair)?: A Little Help needed standing up from a chair using your arms (e.g., wheelchair or bedside chair)?: A Little Help needed to walk in hospital room?: A Little Help needed climbing 3-5 steps with a railing? : A Little 6 Click Score: 19    End of Session Equipment Utilized During Treatment: Gait belt Activity Tolerance: Patient tolerated treatment well;Patient limited by lethargy Patient left: in bed;with call bell/phone within reach;with bed alarm set Nurse Communication: Mobility status;Precautions;Weight bearing status;Other (comment) PT Visit Diagnosis: Other abnormalities of gait and mobility (R26.89);Muscle weakness (generalized) (M62.81);Difficulty in walking, not elsewhere classified (R26.2);Pain Pain - Right/Left: Left Pain - part of body: Knee     Time: 1021-1173 PT Time Calculation (min) (ACUTE ONLY): 24 min  Charges:  $Gait Training: 8-22 mins $Therapeutic Exercise: 8-22 mins                    Julaine Fusi PTA 10/13/20, 4:47 PM

## 2020-10-13 NOTE — Progress Notes (Signed)
Physical Therapy Treatment Patient Details Name: Erin Petersen MRN: [id number] DOB: [birth year] Today's Date: 10/13/2020    History of Present Illness Pt is a 80 y.o. female s/p L TKA secondary OA.  PMH includes R TKA, SOB, sleep apnea (CPAP), htn, pulmonary htn, dysrhythmias, DM, neuropathy R leg and foot, plantar fasciitis, RLS, urinary incontinence, HOH (hearing aid R ear), lumbar fusion, cardiac cath, hernia repair.    PT Comments    Pt resting in bed upon PT arrival; agreeable to PT session; pt with recent pain medications.  Pt tolerated LE ex's in bed with assist for L LE intermittently but pt requiring cueing to stay awake (pt appearing drowsy in bed and intermittently falling asleep).  SBA semi-supine to sitting edge of bed; CGA with transfers using walker; and CGA to ambulate a few feet bed to recliner with walker.  Pt requiring extra time to take steps with walker with cueing for technique.  Limited distance ambulating and activity in general d/t pt c/o lightheadedness (BP 153/71 with HR 78 bpm and O2 sats 94% on room air) and nausea (pt declining nausea medication); nurse notified.  Pt resting in recliner end of session with all needs in reach.  Will continue to focus on strengthening and progressive functional mobility per pt tolerance.    Follow Up Recommendations  Home health PT;Supervision for mobility/OOB     Equipment Recommendations  Rolling walker with 5" wheels;3in1 (PT) (pt has youth sized RW and BSC at home already)    Recommendations for Other Services       Precautions / Restrictions Precautions Precautions: Fall;Knee Precaution Booklet Issued: Yes (comment) Precaution Comments: wound vac Restrictions Weight Bearing Restrictions: Yes LLE Weight Bearing: Weight bearing as tolerated    Mobility  Bed Mobility Overal bed mobility: Needs Assistance Bed Mobility: Supine to Sit     Supine to sit: Supervision;HOB elevated (pt reports bed at home is adjustable  so kept HOB elevated to simulate home set-up per pt request)     General bed mobility comments: increased effort to perform on own    Transfers Overall transfer level: Needs assistance Equipment used: Rolling walker (2 wheeled) (youth sized) Transfers: Sit to/from Stand Sit to Stand: Min guard         General transfer comment: vc's for UE/LE placement  Ambulation/Gait Ambulation/Gait assistance: Min guard Gait Distance (Feet): 3 Feet (bed to recliner) Assistive device: Rolling walker (2 wheeled) (youth sized) Gait Pattern/deviations: Antalgic;Step-to pattern Gait velocity: decreased   General Gait Details: decreased stance time L LE; increased UE support noted through RW to offweight L LE when advancing R LE; increased effort and time to perform   Stairs             Wheelchair Mobility    Modified Rankin (Stroke Patients Only)       Balance Overall balance assessment: Needs assistance Sitting-balance support: No upper extremity supported;Feet supported Sitting balance-Leahy Scale: Good Sitting balance - Comments: steady sitting reaching within BOS   Standing balance support: Single extremity supported Standing balance-Leahy Scale: Fair Standing balance comment: steady standing with at least single UE support on walker                            Cognition Arousal/Alertness: Awake/alert Behavior During Therapy: WFL for tasks assessed/performed Overall Cognitive Status: Within Functional Limits for tasks assessed  Exercises Total Joint Exercises Ankle Circles/Pumps: AROM;Strengthening;Both;10 reps;Supine Quad Sets: AROM;Strengthening;Both;10 reps;Supine Short Arc Quad: AAROM;Strengthening;Left;10 reps;Supine Heel Slides: AAROM;Strengthening;Left;10 reps;Supine Hip ABduction/ADduction: AAROM;Strengthening;Left;10 reps;Supine Straight Leg Raises: AAROM;Strengthening;Left;10  reps;Supine Goniometric ROM: L knee extension 10 degrees short of neutral semi-supine in bed; L knee flexion AROM 80 degrees sitting in recliner    General Comments General comments (skin integrity, edema, etc.): L LE wound vac.  Nursing cleared pt for participation in physical therapy.  Pt agreeable to PT session.      Pertinent Vitals/Pain Pain Assessment: 0-10 Pain Score: 6  Pain Location: L knee Pain Descriptors / Indicators: Aching;Sore;Tender Pain Intervention(s): Limited activity within patient's tolerance;Monitored during session;Premedicated before session;Repositioned;Other (comment) (polar care applied and activated)    Home Living                      Prior Function            PT Goals (current goals can now be found in the care plan section) Acute Rehab PT Goals Patient Stated Goal: to go home today PT Goal Formulation: With patient Time For Goal Achievement: 10/26/20 Potential to Achieve Goals: Good Progress towards PT goals: Progressing toward goals    Frequency    BID      PT Plan Current plan remains appropriate    Co-evaluation              AM-PAC PT "6 Clicks" Mobility   Outcome Measure  Help needed turning from your back to your side while in a flat bed without using bedrails?: None Help needed moving from lying on your back to sitting on the side of a flat bed without using bedrails?: A Little Help needed moving to and from a bed to a chair (including a wheelchair)?: A Little Help needed standing up from a chair using your arms (e.g., wheelchair or bedside chair)?: A Little Help needed to walk in hospital room?: A Little Help needed climbing 3-5 steps with a railing? : A Little 6 Click Score: 19    End of Session Equipment Utilized During Treatment: Gait belt Activity Tolerance: Patient limited by pain;Other (comment) (Limited d/t nausea and lightheadedness) Patient left: in chair;with call bell/phone within reach;with chair  alarm set;with SCD's reapplied;Other (comment) (B heels floating via towel rolls; polar care in place and activated) Nurse Communication: Mobility status;Precautions;Weight bearing status;Other (comment) (pt's nausea and lightheadedness; pt's BP/vitals) PT Visit Diagnosis: Other abnormalities of gait and mobility (R26.89);Muscle weakness (generalized) (M62.81);Difficulty in walking, not elsewhere classified (R26.2);Pain Pain - Right/Left: Left Pain - part of body: Knee     Time: 0912-0956 PT Time Calculation (min) (ACUTE ONLY): 44 min  Charges:  $Gait Training: 8-22 mins $Therapeutic Exercise: 8-22 mins $Therapeutic Activity: 8-22 mins                     Leitha Bleak, PT 10/13/20, 10:53 AM

## 2020-10-13 NOTE — Plan of Care (Signed)
Patient alert and oriented x 4, complains of gradually worsening pain to left lower extremity. She verbalizes some relief with prn pain medications. No respiratory distress during shift on room air. Noted febrile around midnight at 133f provided prn medication and reassessed fever noted at 99 F. Remains on peripheral antibiotic therapy. Polar care remains on surgical site and patient refused to wear Foam therapy to extremity. Wound vac dressing clean, dry and intact with scant drainage to canister. Patient expressed desire to see podiatrist while here due to ingrown toenail of the Left Great Toe. Refused bisacodyl informing that she has IBS, foley removed.  Will continue to monitor.  Problem: Education: Goal: Knowledge of General Education information will improve Description: Including pain rating scale, medication(s)/side effects and non-pharmacologic comfort measures Outcome: Progressing   Problem: Health Behavior/Discharge Planning: Goal: Ability to manage health-related needs will improve Outcome: Progressing   Problem: Clinical Measurements: Goal: Ability to maintain clinical measurements within normal limits will improve Outcome: Progressing Goal: Will remain free from infection Outcome: Progressing Goal: Diagnostic test results will improve Outcome: Progressing Goal: Respiratory complications will improve Outcome: Progressing Goal: Cardiovascular complication will be avoided Outcome: Progressing   Problem: Activity: Goal: Risk for activity intolerance will decrease Outcome: Progressing   Problem: Nutrition: Goal: Adequate nutrition will be maintained Outcome: Progressing   Problem: Coping: Goal: Level of anxiety will decrease Outcome: Progressing   Problem: Elimination: Goal: Will not experience complications related to bowel motility Outcome: Progressing Goal: Will not experience complications related to urinary retention Outcome: Progressing   Problem: Pain  Managment: Goal: General experience of comfort will improve Outcome: Progressing   Problem: Safety: Goal: Ability to remain free from injury will improve Outcome: Progressing   Problem: Skin Integrity: Goal: Risk for impaired skin integrity will decrease Outcome: Progressing

## 2020-10-13 NOTE — Progress Notes (Signed)
Met with the patient in the room to discuss DC plan and needs She lives alone but her sister will nbe staying with her, she has a youth RW and BSC at home and does not need DME She is set up with [place] for [facility] services, she has transportation and can afford her medications

## 2020-10-13 NOTE — Progress Notes (Signed)
PT Cancellation Note  Patient Details Name: Erin Petersen MRN: [id number] DOB: September 04, 1940   Cancelled Treatment:     PT attempt. Pt just received morphine ~ 20 minutes prior and is unable to keep eyes open but still endorsing 8/10 pain. Requested author check temp. (100.4) will return shortly when pt is more appropriate to participate.    Willette Pa 10/13/2020, 2:17 PM

## 2020-10-13 NOTE — Progress Notes (Signed)
   Subjective: 1 Day Post-Op Procedure(s) (LRB): TOTAL KNEE ARTHROPLASTY (Left) Patient reports pain as mild.   Patient is well, and has had no acute complaints or problems Denies any CP, SOB, ABD pain. We will continue therapy today.  Plan is to go Home after hospital stay.  Objective: Vital signs in last 24 hours: Temp:  [97.7 F (36.5 C)-101.1 F (38.4 C)] 98.7 F (37.1 C) (08/17 0806) Pulse Rate:  [63-100] 78 (08/17 0806) Resp:  [12-22] 16 (08/17 0806) BP: (104-170)/(52-75) 146/64 (08/17 0806) SpO2:  [91 %-100 %] 97 % (08/17 0806) Weight:  [91 kg] 91 kg (08/17 0151)  Intake/Output from previous day: 08/16 0701 - 08/17 0700 In: 1745.7 [I.V.:1645.7; IV Piggyback:100] Out: 1975 [TXMIW:8032; Blood:25] Intake/Output this shift: No intake/output data recorded.  Recent Labs    10/12/20 1105 10/13/20 0424  HGB 11.4* 10.4*   Recent Labs    10/12/20 1105 10/13/20 0424  WBC 9.4 10.7*  RBC 4.28 3.92  HCT 35.0* 31.6*  PLT 269 261   Recent Labs    10/12/20 1105 10/13/20 0424  NA  --  131*  K  --  3.4*  CL  --  97*  CO2  --  27  BUN  --  11  CREATININE 0.78 0.73  GLUCOSE  --  145*  CALCIUM  --  8.2*   No results for input(s): LABPT, INR in the last 72 hours.  EXAM General - Patient is Alert, Appropriate, and Oriented Extremity - Neurovascular intact Sensation intact distally Intact pulses distally Dorsiflexion/Plantar flexion intact No cellulitis present Compartment soft Dressing - dressing C/D/I and no drainage, prevena intact with 50cc draiange Motor Function - intact, moving foot and toes well on exam.   Past Medical History:  Diagnosis Date   Arthritis    Asthma    exercise induced asthma   Bacteremia due to Gram-negative bacteria 06/04/2019   Colon polyps    Diabetes mellitus without complication (HCC)    Duodenal ulcer    Dyspnea    GERD (gastroesophageal reflux disease)    Hyperlipidemia    Hypertension    Neuropathy    right leg and  foot   Plantar fasciitis    Pre-diabetes    Prediabetes    RLQ abdominal pain    RLS (restless legs syndrome)    Sleep apnea    CPAP   SOB (shortness of breath)    Urinary incontinence    Urinary tract infection    Wears hearing aid in right ear     Assessment/Plan:   1 Day Post-Op Procedure(s) (LRB): TOTAL KNEE ARTHROPLASTY (Left) Active Problems:   S/P TKR (total knee replacement) using cement, left  Estimated body mass index is 40.88 kg/m as calculated from the following:   Height as of this encounter: 4' 10.74" (1.492 m).   Weight as of this encounter: 91 kg. Advance diet Up with therapy Work on BM Labs stable VSS, fever last night, afebrile this am. Encourage incentive spirometer CM to assist with discharge to home with HHPT  DVT Prophylaxis - Lovenox, Foot Pumps, and TED hose Weight-Bearing as tolerated to left leg   T. [name], PA-C [facility] 10/13/2020, 8:21 AM

## 2020-10-14 ENCOUNTER — Encounter: Payer: Self-pay | Admitting: Orthopedic Surgery

## 2020-10-14 DIAGNOSIS — J449 Chronic obstructive pulmonary disease, unspecified: Secondary | ICD-10-CM | POA: Diagnosis not present

## 2020-10-14 DIAGNOSIS — M1712 Unilateral primary osteoarthritis, left knee: Secondary | ICD-10-CM | POA: Diagnosis not present

## 2020-10-14 DIAGNOSIS — Z7982 Long term (current) use of aspirin: Secondary | ICD-10-CM | POA: Diagnosis not present

## 2020-10-14 DIAGNOSIS — Z85828 Personal history of other malignant neoplasm of skin: Secondary | ICD-10-CM | POA: Diagnosis not present

## 2020-10-14 DIAGNOSIS — J45909 Unspecified asthma, uncomplicated: Secondary | ICD-10-CM | POA: Diagnosis not present

## 2020-10-14 DIAGNOSIS — Z79899 Other long term (current) drug therapy: Secondary | ICD-10-CM | POA: Diagnosis not present

## 2020-10-14 DIAGNOSIS — I10 Essential (primary) hypertension: Secondary | ICD-10-CM | POA: Diagnosis not present

## 2020-10-14 DIAGNOSIS — E119 Type 2 diabetes mellitus without complications: Secondary | ICD-10-CM | POA: Diagnosis not present

## 2020-10-14 LAB — GLUCOSE, CAPILLARY
Glucose-Capillary: 124 mg/dL — ABNORMAL HIGH (ref 70–99)
Glucose-Capillary: 135 mg/dL — ABNORMAL HIGH (ref 70–99)

## 2020-10-14 MED ORDER — TRAMADOL HCL 50 MG PO TABS: 50.0000 mg | ORAL_TABLET | Freq: Four times a day (QID) | ORAL | 0 refills | Status: DC | PRN

## 2020-10-14 MED ORDER — ENOXAPARIN SODIUM 40 MG/0.4ML IJ SOSY: 40.0000 mg | PREFILLED_SYRINGE | INTRAMUSCULAR | 0 refills | Status: DC

## 2020-10-14 MED ORDER — SENNOSIDES-DOCUSATE SODIUM 8.6-50 MG PO TABS: 1.0000 | ORAL_TABLET | Freq: Two times a day (BID) | ORAL | 0 refills | Status: DC

## 2020-10-14 MED ORDER — OXYCODONE HCL ER 15 MG PO T12A: 15.0000 mg | EXTENDED_RELEASE_TABLET | Freq: Two times a day (BID) | ORAL | 0 refills | Status: AC

## 2020-10-14 MED ORDER — HYDROCODONE-ACETAMINOPHEN 5-325 MG PO TABS: 1.0000 | ORAL_TABLET | ORAL | 0 refills | Status: DC | PRN

## 2020-10-14 NOTE — Progress Notes (Signed)
Physical Therapy Treatment Patient Details Name: Erin Petersen MRN: [id number] DOB: 10/11/1940 Today's Date: 10/14/2020    History of Present Illness Pt is a 80 y.o. female s/p L TKA secondary OA.  PMH includes R TKA, SOB, sleep apnea (CPAP), htn, pulmonary htn, dysrhythmias, DM, neuropathy R leg and foot, plantar fasciitis, RLS, urinary incontinence, HOH (hearing aid R ear), lumbar fusion, cardiac cath, hernia repair.    PT Comments    Pt was long sitting in bed upon arriving. Agrees to PT session and is much more alert this afternoon versus earlier in the day. Supportive sister still at bedside. Pt was able to exit bed, stand to RW, and ambulate to BR prior to ambulation 100 ft with RW. She demonstrated safe ability to ascend/descend 1 step with RW. Performed FWD/backwards without safety concern. Pt is cleared from acute PT standpoint to safely DC home with HHPT to follow. Will benefit form continued skilled PT to assist pt to PLOF.   Follow Up Recommendations  Home health PT;Supervision for mobility/OOB;Other (comment);Supervision/Assistance - 24 hour     Equipment Recommendations  Rolling walker with 5" wheels;3in1 (PT)       Precautions / Restrictions Precautions Precautions: Fall;Knee Precaution Booklet Issued: Yes (comment) Precaution Comments: wound vac Restrictions Weight Bearing Restrictions: Yes LLE Weight Bearing: Weight bearing as tolerated    Mobility  Bed Mobility Overal bed mobility: Needs Assistance Bed Mobility: Supine to Sit     Supine to sit: Supervision;HOB elevated     General bed mobility comments: pt continues to demonstrate safe ability to exit bed    Transfers Overall transfer level: Needs assistance Equipment used: Rolling walker (2 wheeled) Transfers: Sit to/from Stand Sit to Stand: Supervision         General transfer comment: pt easily stood from EOB and from [facility] without physical assistance  Ambulation/Gait Ambulation/Gait  assistance: Min guard Gait Distance (Feet): 100 Feet Assistive device: Rolling walker (2 wheeled) Gait Pattern/deviations: Antalgic;Step-to pattern Gait velocity: decreased   General Gait Details: distance limited by fatigue     Balance Overall balance assessment: Needs assistance Sitting-balance support: No upper extremity supported;Feet supported Sitting balance-Leahy Scale: Good     Standing balance support: Bilateral upper extremity supported;During functional activity Standing balance-Leahy Scale: Good Standing balance comment: no LOB with UE support during static and dynamic activity      Cognition Arousal/Alertness: Awake/alert Behavior During Therapy: WFL for tasks assessed/performed Overall Cognitive Status: Within Functional Limits for tasks assessed    General Comments: pt much less lethargic this afternoon versus AM session. She agrees to PT session and continues to [employer] motivated to DC home             Pertinent Vitals/Pain Pain Assessment: 0-10 Pain Score: 2  Pain Location: L knee Pain Descriptors / Indicators: Aching;Sore;Tender Pain Intervention(s): Limited activity within patient's tolerance;Monitored during session;Premedicated before session;Repositioned;Ice applied     PT Goals (current goals can now be found in the care plan section) Acute Rehab PT Goals Patient Stated Goal: go home and return to PLOF Progress towards PT goals: Progressing toward goals    Frequency    BID      PT Plan Current plan remains appropriate       AM-PAC PT "6 Clicks" Mobility   Outcome Measure  Help needed turning from your back to your side while in a flat bed without using bedrails?: None Help needed moving from lying on your back to sitting on the side of a flat  bed without using bedrails?: A Little Help needed moving to and from a bed to a chair (including a wheelchair)?: A Little Help needed standing up from a chair using your arms (e.g., wheelchair  or bedside chair)?: A Little Help needed to walk in hospital room?: A Little Help needed climbing 3-5 steps with a railing? : A Little 6 Click Score: 19    End of Session Equipment Utilized During Treatment: Gait belt Activity Tolerance: Patient tolerated treatment well;Patient limited by fatigue Patient left: in chair;with call bell/phone within reach;with chair alarm set;with family/visitor present;with SCD's reapplied Nurse Communication: Mobility status PT Visit Diagnosis: Other abnormalities of gait and mobility (R26.89);Muscle weakness (generalized) (M62.81);Difficulty in walking, not elsewhere classified (R26.2);Pain Pain - Right/Left: Left Pain - part of body: Knee     Time: 3825-0539 PT Time Calculation (min) (ACUTE ONLY): 24 min  Charges:  $Gait Training: 8-22 mins $Therapeutic Activity: 8-22 mins                    Julaine Fusi PTA 10/14/20, 4:25 PM

## 2020-10-14 NOTE — Plan of Care (Signed)

## 2020-10-14 NOTE — Progress Notes (Signed)
   Subjective: 2 Days Post-Op Procedure(s) (LRB): TOTAL KNEE ARTHROPLASTY (Left) Patient reports pain as moderate  Patient is well, and has had no acute complaints or problems Denies any CP, SOB, ABD pain. We will continue therapy today.  Plan is to go Home after hospital stay.  Objective: Vital signs in last 24 hours: Temp:  [98.1 F (36.7 C)-100.8 F (38.2 C)] 99 F (37.2 C) (08/18 0751) Pulse Rate:  [78-89] 89 (08/18 0751) Resp:  [16-18] 18 (08/18 0751) BP: (125-170)/(65-68) 153/67 (08/18 0751) SpO2:  [92 %-97 %] 92 % (08/18 0751)  Intake/Output from previous day: 08/17 0701 - 08/18 0700 In: 120 [P.O.:120] Out: 250 [Urine:200; Drains:50] Intake/Output this shift: Total I/O In: 120 [P.O.:120] Out: -   Recent Labs    10/12/20 1105 10/13/20 0424  HGB 11.4* 10.4*   Recent Labs    10/12/20 1105 10/13/20 0424  WBC 9.4 10.7*  RBC 4.28 3.92  HCT 35.0* 31.6*  PLT 269 261   Recent Labs    10/12/20 1105 10/13/20 0424  NA  --  131*  K  --  3.4*  CL  --  97*  CO2  --  27  BUN  --  11  CREATININE 0.78 0.73  GLUCOSE  --  145*  CALCIUM  --  8.2*   No results for input(s): LABPT, INR in the last 72 hours.  EXAM General - Patient is Alert, Appropriate, and Oriented Extremity - Neurovascular intact Sensation intact distally Intact pulses distally Dorsiflexion/Plantar flexion intact No cellulitis present Compartment soft Dressing - dressing C/D/I and no drainage, prevena intact with 50cc draiange Motor Function - intact, moving foot and toes well on exam.   Past Medical History:  Diagnosis Date   Arthritis    Asthma    exercise induced asthma   Bacteremia due to Gram-negative bacteria 06/04/2019   Colon polyps    Diabetes mellitus without complication (HCC)    Duodenal ulcer    Dyspnea    GERD (gastroesophageal reflux disease)    Hyperlipidemia    Hypertension    Neuropathy    right leg and foot   Plantar fasciitis    Pre-diabetes    Prediabetes     RLQ abdominal pain    RLS (restless legs syndrome)    Sleep apnea    CPAP   SOB (shortness of breath)    Urinary incontinence    Urinary tract infection    Wears hearing aid in right ear     Assessment/Plan:   2 Days Post-Op Procedure(s) (LRB): TOTAL KNEE ARTHROPLASTY (Left) Active Problems:   S/P TKR (total knee replacement) using cement, left  Estimated body mass index is 40.88 kg/m as calculated from the following:   Height as of this encounter: 4' 10.74" (1.492 m).   Weight as of this encounter: 91 kg. Advance diet Up with therapy Work on [employer] VSS Encourage incentive spirometer CM to assist with discharge to home with HHPT today pending progress with PT  DVT Prophylaxis - Lovenox, Foot Pumps, and TED hose Weight-Bearing as tolerated to left leg   T. [name], PA-C [place] 10/14/2020, 9:45 AM

## 2020-10-14 NOTE — Progress Notes (Signed)
Physical Therapy Treatment Patient Details Name: Erin Petersen MRN: [id number] DOB: 06/12/1940 Today's Date: 10/14/2020    History of Present Illness Pt is a 80 y.o. female s/p L TKA secondary OA.  PMH includes R TKA, SOB, sleep apnea (CPAP), htn, pulmonary htn, dysrhythmias, DM, neuropathy R leg and foot, plantar fasciitis, RLS, urinary incontinence, HOH (hearing aid R ear), lumbar fusion, cardiac cath, hernia repair.    PT Comments    Pt was long sitting in bed upon arriving. She agrees to session even prior to pain med issued. Endorses pain but did not limit session progression. Her supportive sister is present and will be assisting her at DC for next 12 days or so. Both feel confident in there abilities to safely DC home. Pt was easily able to exit bed, stand and ambulate with very limited assistance. Only able to ambulate to doorway of room prior to being unable to stay awake. Pt has been struggling with lethargy throughout hospitalization but did say she was seeing a MD about this but appointments were rescheduled for post knee replacement. Pt will greatly benefit from HHPT at DC. PA notified about lethargy concerns.     Follow Up Recommendations  Home health PT;Supervision for mobility/OOB;Other (comment);Supervision/Assistance - 24 hour     Equipment Recommendations  Pt has equipment needs met       Precautions / Restrictions Precautions Precautions: Fall;Knee Precaution Booklet Issued: Yes (comment) Precaution Comments: wound vac Restrictions Weight Bearing Restrictions: Yes LLE Weight Bearing: Weight bearing as tolerated    Mobility  Bed Mobility Overal bed mobility: Needs Assistance Bed Mobility: Supine to Sit     Supine to sit: Supervision;HOB elevated     General bed mobility comments: pt and pt's sister was able to exit bed without assistance. demonstarted safe abilities with good technique. vcs only for improved sequencing    Transfers Overall transfer  level: Needs assistance Equipment used: Rolling walker (2 wheeled) Transfers: Sit to/from Stand Sit to Stand: Supervision         General transfer comment: pt easily stood from EOB and from [facility] without physical assistance  Ambulation/Gait Ambulation/Gait assistance: Min guard Gait Distance (Feet): 25 Feet Assistive device: Rolling walker (2 wheeled) Gait Pattern/deviations: Antalgic;Step-to pattern Gait velocity: decreased   General Gait Details: distance limited by pt falling asleep during git training. Had chair follow for safety due to lethargy. PA made aware of lethargy concerns.    Balance Overall balance assessment: Needs assistance Sitting-balance support: No upper extremity supported;Feet supported Sitting balance-Leahy Scale: Good     Standing balance support: Bilateral upper extremity supported;During functional activity Standing balance-Leahy Scale: Good      Cognition Arousal/Alertness: Lethargic Behavior During Therapy: WFL for tasks assessed/performed Overall Cognitive Status: Within Functional Limits for tasks assessed      General Comments: pt was A and O x 4.Eager to DC home with sister who will be staying with her for 2 weeks             Pertinent Vitals/Pain Pain Assessment: 0-10 Pain Score: 4  Pain Location: L knee Pain Descriptors / Indicators: Aching;Sore;Tender Pain Intervention(s): Limited activity within patient's tolerance;Monitored during session;Premedicated before session;Repositioned;Ice applied     PT Goals (current goals can now be found in the care plan section) Acute Rehab PT Goals Patient Stated Goal: go home and return to PLOF Progress towards PT goals: Progressing toward goals    Frequency    BID      PT Plan Current plan  remains appropriate       AM-PAC PT "6 Clicks" Mobility   Outcome Measure  Help needed turning from your back to your side while in a flat bed without using bedrails?: None Help needed moving  from lying on your back to sitting on the side of a flat bed without using bedrails?: A Little Help needed moving to and from a bed to a chair (including a wheelchair)?: A Little Help needed standing up from a chair using your arms (e.g., wheelchair or bedside chair)?: A Little Help needed to walk in hospital room?: A Little Help needed climbing 3-5 steps with a railing? : A Little 6 Click Score: 19    End of Session Equipment Utilized During Treatment: Gait belt Activity Tolerance: Patient tolerated treatment well;Patient limited by lethargy Patient left: in chair;with call bell/phone within reach;with chair alarm set;with family/visitor present;with SCD's reapplied Nurse Communication: Mobility status PT Visit Diagnosis: Other abnormalities of gait and mobility (R26.89);Muscle weakness (generalized) (M62.81);Difficulty in walking, not elsewhere classified (R26.2);Pain Pain - Right/Left: Left Pain - part of body: Knee     Time: 6579-0383 PT Time Calculation (min) (ACUTE ONLY): 31 min  Charges:  $Gait Training: 8-22 mins $Therapeutic Activity: 8-22 mins                     Julaine Fusi PTA 10/14/20, 1:20 PM

## 2020-10-14 NOTE — Discharge Instructions (Signed)

## 2020-10-14 NOTE — Discharge Summary (Signed)
Physician Discharge Summary  Patient ID: Erin Petersen MRN: [id number] DOB/AGE: 1940/04/21 80 y.o.  Admit date: 10/12/2020 Discharge date: 10/14/2020  Admission Diagnoses:  S/P TKR (total knee replacement) using cement, left [Z96.652]   Discharge Diagnoses: Patient Active Problem List   Diagnosis Date Noted   S/P TKR (total knee replacement) using cement, left 10/12/2020   Daytime sleepiness 09/09/2020   Rash 09/09/2020   Medicare annual wellness visit, subsequent 08/25/2020   Encounter for screening mammogram for breast cancer 08/25/2020   Encounter for general adult medical examination with abnormal findings 08/25/2020   GERD (gastroesophageal reflux disease) 08/25/2020   Dizzy spells 08/25/2020   Migraine 03/12/2020   IBS (irritable bowel syndrome) 03/12/2020   S/P TKR (total knee replacement) using cement, right 01/01/2020   History of vaginal hysterectomy 06/11/2019   DM type 2 (diabetes mellitus, type 2) ([place]) 04/22/2019   Estrogen deficiency 04/22/2019   History of skin cancer 04/22/2019   High-tone pelvic floor dysfunction 04/03/2019   IC (interstitial cystitis) 04/03/2019   Neuralgia of both pudendal nerves 04/03/2019   Pelvic pain in female 04/03/2019   Osteoporosis 02/12/2019   Asthma 01/31/2019   Adverse reaction to influenza vaccine, initial encounter 12/03/2018   Vitamin D deficiency 12/03/2018   Restless legs syndrome 11/28/2018   Small fiber neuropathy 11/28/2018   Allergic rhinitis 10/08/2018   Benign essential hypertension 10/08/2018   Chronic bladder pain 10/08/2018   Neuropathy 10/08/2018   Osteoarthritis 10/08/2018   S/P BSO (bilateral salpingo-oophorectomy) 01/07/2018   OSA on CPAP 12/29/2016   Other and unspecified disc disorder of lumbar region 10/25/2000   DM type 2 with diabetic mixed hyperlipidemia ([place]) 09/24/1998   Tinnitus of both ears 09/24/1983    Past Medical History:  Diagnosis Date   Arthritis    Asthma    exercise induced  asthma   Bacteremia due to Gram-negative bacteria 06/04/2019   Colon polyps    Diabetes mellitus without complication (HCC)    Duodenal ulcer    Dyspnea    GERD (gastroesophageal reflux disease)    Hyperlipidemia    Hypertension    Neuropathy    right leg and foot   Plantar fasciitis    Pre-diabetes    Prediabetes    RLQ abdominal pain    RLS (restless legs syndrome)    Sleep apnea    CPAP   SOB (shortness of breath)    Urinary incontinence    Urinary tract infection    Wears hearing aid in right ear      Transfusion: none   Consultants (if any):   Discharged Condition: Improved  Hospital Course: Erin Petersen is an 80 y.o. female who was admitted 10/12/2020 with a diagnosis of Left knee Osteoarthritis and went to the operating room on 10/12/2020 and underwent the above named procedures.    Surgeries: Procedure(s): TOTAL KNEE ARTHROPLASTY on 10/12/2020 Patient tolerated the surgery well. Taken to PACU where she was stabilized and then transferred to the orthopedic floor.  Started on Lovenox 30 mg q 12 hrs. Foot pumps applied bilaterally at 80 mm. Heels elevated on bed with rolled towels. No evidence of DVT. Negative [place]oman. Physical therapy started on day #1 for gait training and transfer. OT started day #1 for ADL and assisted devices.  Patient's foley was d/c on day #1. Patient's IV  was d/c on day #2.  On post op day #2 patient was stable and ready for discharge to home with HHPT.    She was  given perioperative antibiotics:  Anti-infectives (From admission, onward)    Start     Dose/Rate Route Frequency Ordered Stop   10/13/20 0900  cephALEXin (KEFLEX) capsule 500 mg        500 mg Oral Every 8 hours 10/13/20 0814     10/12/20 1330  ceFAZolin (ANCEF) IVPB 2g/100 mL premix        2 g 200 mL/hr over 30 Minutes Intravenous Every 6 hours 10/12/20 1049 10/13/20 2142   10/12/20 0621  ceFAZolin (ANCEF) 2-4 GM/100ML-% IVPB       Note to Pharmacy: Trudie Reed   : cabinet  override      10/12/20 0621 10/12/20 0730   10/12/20 0600  ceFAZolin (ANCEF) IVPB 2g/100 mL premix        2 g 200 mL/hr over 30 Minutes Intravenous On call to O.R. 10/11/20 2238 10/12/20 3300     .  She was given sequential compression devices, early ambulation, and Lovenox TEDs for DVT prophylaxis.  She benefited maximally from the hospital stay and there were no complications.    Recent vital signs:  Vitals:   10/14/20 0528 10/14/20 0751  BP: (!) 170/68 (!) 153/67  Pulse: 89 89  Resp: 17 18  Temp: 98.1 F (36.7 C) 99 F (37.2 C)  SpO2: 95% 92%    Recent laboratory studies:  Lab Results  Component Value Date   HGB 10.4 (L) 10/13/2020   HGB 11.4 (L) 10/12/2020   HGB 12.2 10/01/2020   Lab Results  Component Value Date   WBC 10.7 (H) 10/13/2020   PLT 261 10/13/2020   No results found for: INR Lab Results  Component Value Date   NA 131 (L) 10/13/2020   K 3.4 (L) 10/13/2020   CL 97 (L) 10/13/2020   CO2 27 10/13/2020   BUN 11 10/13/2020   CREATININE 0.73 10/13/2020   GLUCOSE 145 (H) 10/13/2020    Discharge Medications:   Allergies as of 10/14/2020       Reactions   Bee Venom Anaphylaxis, Hives   Fosamax [alendronate] Nausea Only   Ciprofloxacin Hcl Rash   Mild rash after long term use   Sulfa Antibiotics Rash   Bactrim        Medication List     STOP taking these medications    acetaminophen 650 MG CR tablet Commonly known as: TYLENOL   amLODipine 5 MG tablet Commonly known as: NORVASC   cephALEXin 500 MG capsule Commonly known as: KEFLEX   EPINEPHrine 0.3 mg/0.3 mL Soaj injection Commonly known as: EPI-PEN   estradiol 0.1 MG/GM vaginal cream Commonly known as: ESTRACE   meloxicam 15 MG tablet Commonly known as: MOBIC   triamcinolone cream 0.1 % Commonly known as: KENALOG   valACYclovir 1000 MG tablet Commonly known as: Valtrex       TAKE these medications    albuterol 108 (90 Base) MCG/ACT inhaler Commonly known as: VENTOLIN  HFA Inhale 2 puffs into the lungs every 6 (six) hours as needed for wheezing or shortness of breath.   amitriptyline 25 MG tablet Commonly known as: ELAVIL Take 1 tablet (25 mg total) by mouth at bedtime.   aspirin 81 MG EC tablet Take 81 mg by mouth at bedtime.   docusate sodium 100 MG capsule Commonly known as: COLACE Take 1 capsule (100 mg total) by mouth 2 (two) times daily.   enoxaparin 40 MG/0.4ML injection Commonly known as: LOVENOX Inject 0.4 mLs (40 mg total) into the skin daily.  fluticasone 50 MCG/ACT nasal spray Commonly known as: FLONASE Place 2 sprays into both nostrils daily. What changed: when to take this   hydrochlorothiazide 25 MG tablet Commonly known as: HYDRODIURIL Take 1 tablet (25 mg total) by mouth daily.   HYDROcodone-acetaminophen 5-325 MG tablet Commonly known as: NORCO/VICODIN Take 1 tablet by mouth every 4 (four) hours as needed for moderate pain (pain score 4-6).   hydrOXYzine 25 MG tablet Commonly known as: ATARAX/VISTARIL Take 1 tablet (25 mg total) by mouth at bedtime.   Linzess 145 MCG Caps capsule Generic drug: linaclotide TAKE 1 CAPSULE DAILY BEFORE BREAKFAST What changed: See the new instructions.   MELATONIN PO Take 1 tablet by mouth at bedtime.   omeprazole 40 MG capsule Commonly known as: PRILOSEC Take 40 mg by mouth in the morning.   ondansetron 4 MG tablet Commonly known as: Zofran Take 1 tablet (4 mg total) by mouth every 8 (eight) hours as needed for nausea or vomiting.   OneTouch Delica Plus [id number] Misc To check glucose daily and prn for DM2   OneTouch Verio test strip Generic drug: glucose blood To check glucose daily and prn for DM2   OneTouch Verio w/Device Kit To check glucose daily and prn for DM2   oxyCODONE 15 mg 12 hr tablet Commonly known as: OXYCONTIN Take 1 tablet (15 mg total) by mouth every 12 (twelve) hours for 7 days.   polyethylene glycol 17 g packet Commonly known as: MIRALAX /  GLYCOLAX Take 1 packet by mouth daily.   pregabalin 150 MG capsule Commonly known as: LYRICA Take 1 capsule (150 mg total) by mouth 2 (two) times daily. What changed:  when to take this additional instructions   rOPINIRole 4 MG tablet Commonly known as: Requip Take 1 tablet (4 mg total) by mouth at bedtime. What changed: Another medication with the same name was changed. Make sure you understand how and when to take each.   rOPINIRole 1 MG tablet Commonly known as: REQUIP TAKE 1 TABLET IN THE MORNING AND 1 TABLET IN THE AFTERNOON What changed:  how much to take how to take this when to take this additional instructions   senna-docusate 8.6-50 MG tablet Commonly known as: Senokot-S Take 1 tablet by mouth 2 (two) times daily.   simvastatin 20 MG tablet Commonly known as: ZOCOR Take 1 tablet (20 mg total) by mouth at bedtime.   traMADol 50 MG tablet Commonly known as: ULTRAM Take 1 tablet (50 mg total) by mouth every 6 (six) hours as needed.   TURMERIC PO Take 1,000 mg by mouth daily.   Vitamin B-12 1000 MCG Subl Take 2,000 mcg by mouth daily.   Vitamin D3 50 MCG (2000 UT) Tabs Take 2,000 Units by mouth daily.        Diagnostic Studies: DG Knee 1-2 Views Left  Result Date: 10/12/2020 CLINICAL DATA:  Postop pain EXAM: LEFT KNEE - 1-2 VIEW COMPARISON:  None. FINDINGS: Left total knee arthroplasty. Postoperative soft tissue swelling and air. Skin staples. No evidence of complication. IMPRESSION: Standard appearance of left total knee arthroplasty. Electronically Signed   By: Macy Mis M.D.   On: 10/12/2020 09:58   MM 3D SCREEN BREAST BILATERAL  Result Date: 09/21/2020 CLINICAL DATA:  Screening. EXAM: DIGITAL SCREENING BILATERAL MAMMOGRAM WITH TOMOSYNTHESIS AND CAD TECHNIQUE: Bilateral screening digital craniocaudal and mediolateral oblique mammograms were obtained. Bilateral screening digital breast tomosynthesis was performed. The images were evaluated with  computer-aided detection. COMPARISON:  Previous exam(s). ACR Breast Density  Category b: There are scattered areas of fibroglandular density. FINDINGS: There are no findings suspicious for malignancy. IMPRESSION: No mammographic evidence of malignancy. A result letter of this screening mammogram will be mailed directly to the patient. RECOMMENDATION: Screening mammogram in one year. (Code:SM-B-01Y) BI-RADS CATEGORY  1: Negative. Electronically Signed   By: Abelardo Diesel M.D.   On: 09/21/2020 14:23   Disposition:      Follow-up Information     Duanne Guess, PA-C Follow up in 2 week(s).   Specialties: Orthopedic Surgery, Emergency Medicine Contact information: [place] [address] [phone]                  Signed: Feliberto Gottron 10/14/2020, 9:58 AM

## 2020-10-16 DIAGNOSIS — G2581 Restless legs syndrome: Secondary | ICD-10-CM | POA: Diagnosis not present

## 2020-10-16 DIAGNOSIS — Z471 Aftercare following joint replacement surgery: Secondary | ICD-10-CM | POA: Diagnosis not present

## 2020-10-16 DIAGNOSIS — Z602 Problems related to living alone: Secondary | ICD-10-CM | POA: Diagnosis not present

## 2020-10-16 DIAGNOSIS — M519 Unspecified thoracic, thoracolumbar and lumbosacral intervertebral disc disorder: Secondary | ICD-10-CM | POA: Diagnosis not present

## 2020-10-16 DIAGNOSIS — J449 Chronic obstructive pulmonary disease, unspecified: Secondary | ICD-10-CM | POA: Diagnosis not present

## 2020-10-16 DIAGNOSIS — G43909 Migraine, unspecified, not intractable, without status migrainosus: Secondary | ICD-10-CM | POA: Diagnosis not present

## 2020-10-16 DIAGNOSIS — G4733 Obstructive sleep apnea (adult) (pediatric): Secondary | ICD-10-CM | POA: Diagnosis not present

## 2020-10-16 DIAGNOSIS — M810 Age-related osteoporosis without current pathological fracture: Secondary | ICD-10-CM | POA: Diagnosis not present

## 2020-10-16 DIAGNOSIS — K635 Polyp of colon: Secondary | ICD-10-CM | POA: Diagnosis not present

## 2020-10-16 DIAGNOSIS — I10 Essential (primary) hypertension: Secondary | ICD-10-CM | POA: Diagnosis not present

## 2020-10-16 DIAGNOSIS — E782 Mixed hyperlipidemia: Secondary | ICD-10-CM | POA: Diagnosis not present

## 2020-10-16 DIAGNOSIS — E1142 Type 2 diabetes mellitus with diabetic polyneuropathy: Secondary | ICD-10-CM | POA: Diagnosis not present

## 2020-10-16 DIAGNOSIS — E1169 Type 2 diabetes mellitus with other specified complication: Secondary | ICD-10-CM | POA: Diagnosis not present

## 2020-10-16 DIAGNOSIS — K219 Gastro-esophageal reflux disease without esophagitis: Secondary | ICD-10-CM | POA: Diagnosis not present

## 2020-10-16 DIAGNOSIS — E559 Vitamin D deficiency, unspecified: Secondary | ICD-10-CM | POA: Diagnosis not present

## 2020-10-16 DIAGNOSIS — K589 Irritable bowel syndrome without diarrhea: Secondary | ICD-10-CM | POA: Diagnosis not present

## 2020-10-21 ENCOUNTER — Other Ambulatory Visit: Payer: Self-pay

## 2020-10-21 ENCOUNTER — Emergency Department
Admission: EM | Admit: 2020-10-21 | Discharge: 2020-10-21 | Disposition: A | Discharge status: Home / Self Care | Payer: Medicare Other | Attending: Emergency Medicine | Admitting: Emergency Medicine

## 2020-10-21 DIAGNOSIS — R110 Nausea: Secondary | ICD-10-CM | POA: Insufficient documentation

## 2020-10-21 DIAGNOSIS — Z5321 Procedure and treatment not carried out due to patient leaving prior to being seen by health care provider: Secondary | ICD-10-CM | POA: Insufficient documentation

## 2020-10-21 DIAGNOSIS — Z96659 Presence of unspecified artificial knee joint: Secondary | ICD-10-CM | POA: Diagnosis not present

## 2020-10-21 DIAGNOSIS — I10 Essential (primary) hypertension: Secondary | ICD-10-CM | POA: Insufficient documentation

## 2020-10-21 LAB — CBC
HCT: 35.1 % — ABNORMAL LOW (ref 36.0–46.0)
Hemoglobin: 11.8 g/dL — ABNORMAL LOW (ref 12.0–15.0)
MCH: 27 pg (ref 26.0–34.0)
MCHC: 33.6 g/dL (ref 30.0–36.0)
MCV: 80.3 fL (ref 80.0–100.0)
Platelets: 567 10*3/uL — ABNORMAL HIGH (ref 150–400)
RBC: 4.37 MIL/uL (ref 3.87–5.11)
RDW: 15.3 % (ref 11.5–15.5)
WBC: 10.4 10*3/uL (ref 4.0–10.5)
nRBC: 0 % (ref 0.0–0.2)

## 2020-10-21 LAB — BASIC METABOLIC PANEL
Anion gap: 12 (ref 5–15)
BUN: 14 mg/dL (ref 8–23)
CO2: 25 mmol/L (ref 22–32)
Calcium: 9.3 mg/dL (ref 8.9–10.3)
Chloride: 93 mmol/L — ABNORMAL LOW (ref 98–111)
Creatinine, Ser: 0.71 mg/dL (ref 0.44–1.00)
GFR, Estimated: >60 mL/min (ref >60)
Glucose, Bld: 112 mg/dL — ABNORMAL HIGH (ref 70–99)
Potassium: 3.6 mmol/L (ref 3.5–5.1)
Sodium: 130 mmol/L — ABNORMAL LOW (ref 135–145)

## 2020-10-21 NOTE — ED Notes (Signed)
Pt states she feels like passing out and needs to lay down. Hyperventilating, encouraged slow deep breaths in through nose, placed in recliner chair, bp 176/84. Call light in reach, will monitor.

## 2020-10-21 NOTE — ED Triage Notes (Signed)
Pt to ER via POV. Reports that she had a knee replacement last week and has been having episodes where her blood pressure becomes extremely high and she feels lightheaded. Denies chest pain or headache. Reports some nausea. States today it was 203/149 at 1630 this afternoon. Has been in touch with her surgeons and they believe it could be related to pain or her diabetes.

## 2020-10-22 ENCOUNTER — Telehealth: Payer: Self-pay

## 2020-10-22 NOTE — Telephone Encounter (Signed)
[facility] - Client TELEPHONE ADVICE RECORD [facility] Patient Name: Erin Petersen Gender: Female DOB: 07/01/1940 Age: 80 Y 29 D Return Phone Number: [phone] (Primary) Address: City/ State/ Zip: [place] [address] Client [facility] - Client Client Site [place] MD Contact Type Call Who Is Calling Patient / Member / Family / Caregiver Call Type Triage / Clinical Relationship To Patient Self Return Phone Number [phone] (Primary) Chief Complaint Dizziness Reason for Call Symptomatic / Request for [place] states has HBP- 188/91. Last night it was 103/149. She went to the ER, after waiting five hours, she just left. When she's walking around, or sitting up she feels dizzy, and nausea. Translation No Nurse Assessment Nurse: Kathi Ludwig, RN, Leana Roe Date/Time (Eastern Time): 10/22/2020 8:31:45 AM Confirm and document reason for call. If symptomatic, describe symptoms. ---Caller states bp 188/91. takes HCTZ 74m. Last night it was 203/149. surgeon office sent her to the ER, after waiting five hours, she just left. knee replacement 8/16. When she's walking around, or sitting up she feels dizzy, and nausea. Does the patient have any new or worsening symptoms? ---Yes Will a triage be completed? ---Yes Related visit to physician within the last 2 weeks? ---No Does the PT have any chronic conditions? (i.e. diabetes, asthma, this includes High risk factors for pregnancy, etc.) ---Yes List chronic conditions. ---HTN, DM. fibromyalgia Is this a behavioral health or substance abuse call? ---No Guidelines Guideline Title Affirmed Question Affirmed Notes Nurse Date/Time (Eastern Time) Dizziness - Lightheadedness Shock suspected (e.g., cold/pale/ clammy skin, too weak to stand, low BP, rapid pulse) PKathi Ludwig RN, TLeana Roe 10/22/2020 8:35:00 AM PLEASE NOTE: All timestamps contained within this report are represented as [place] Federation[place] Time. CONFIDENTIALTY NOTICE: This fax transmission is intended only for the addressee. It contains information that is legally privileged, confidential or otherwise protected from use or disclosure. If you are not the intended recipient, you are strictly prohibited from reviewing, disclosing, copying using or disseminating any of this information or taking any action in reliance on or regarding this information. If you have received this fax in error, please notify [place]mediately by telephone so that we can arrange for its return to [place] Phone: [phone] Toll-Free: [phone] Fax: [phone]: 2 of 2 Call Id: [name] Time ([name] GhaziTime) Disposition Final User 10/22/2020 8:46:32 AM 911 Outcome Documentation PKathi Ludwig RN, TLeana RoeReason: states will not go back to hospital ER. info providing to backline at office 10/22/2020 8:38:09 AM Call EMS 911 Now Yes PKathi Ludwig RN, TLeana RoeCaller Disagree/Comply Disagree Caller Understands Yes PreDisposition Call Doctor Care Advice Given Per Guideline CALL EMS 911 NOW: * Immediate medical attention is needed. You need to hang up and call 911 (or an ambulance). * Triager Discretion: I'll call you back in a few minutes to be sure you were able to reach them. CARE ADVICE given per Dizziness (Adult) guideline. Comments User: KBaruch GoldmannDate/Time ([name] GhaziTime): 10/22/2020 8:24:43 AM Call transferred from the office. User: TEstevan Ryder RN Date/Time ([name] GhaziTime): 10/22/2020 8:35:12 AM currently has dizziness User: TEstevan Ryder RN Date/Time ([name] GhaziTime): 10/22/2020 8:36:20 AM pain med at [place] TEstevan Ryder RN Date/Time ([name] GhaziTime): 10/22/2020 8:37:27 AM had PT yesterday for knee User: TEstevan Ryder RN Date/Time ([name] GhaziTime): 10/22/2020 8:38:47 AM had lab, EKG last night at ER User: TEstevan Ryder  RN Date/Time ([name] GhaziTime): 10/22/2020 8:39:05 AM feels cold and  clammy when standing User: Estevan Ryder, RN Date/Time [name] Ghazi Time): 10/22/2020 8:45:22 AM no appt avail today at office per backline Referrals [place] REFUSED

## 2020-10-22 NOTE — Telephone Encounter (Signed)
She used to be on hctz and amlodipine for HTN but we stopped amlodipine at last visit due to hypotension  She should be on hctz by itself now  If bp does not continue to improve or not feeling better please alert me and follow up

## 2020-10-22 NOTE — Telephone Encounter (Signed)
I spoke with Erin Petersen (DPR signed and if need to speak with anyone call [name] went to ED but left without being seen see lab results.pt sat in waiting room for 3 hrs and then was told she could leave. Pt left without questioning the fact pt was never seen.Pt has not been taking BP meds or any other meds for 10 days. Pt took BP med today and this morning BP was Today BP 142/88.  Erin Petersen said she will monitor pt but today pt seems to e better no C P,SOB, H/A or dizziness.if need to speak with someone please call [place] instead of pt. Sending note to Dr Glori Bickers and [place] CMA.

## 2020-10-25 NOTE — Telephone Encounter (Signed)
She can wait until then  Sounds like the auto cuff is not accurate-don't use it anymore

## 2020-10-25 NOTE — Telephone Encounter (Signed)
Called pt to check in and advise her of Dr. Marliss Coots comments pt isn't taking amlodipine only HCTZ as directed. Pt said after her recent surgery she didn't see the note on her check out papers to resume her meds so she went 10 days without them. Pt said once she got back on her meds she started feeling much better and is back to baseline. Pt said that her PT came out and her BP was 132/70, and is usually normal when they check it. Pt said she got a new BP automatic cuff and it was 168/72, pt said every time she checks her BP with the automatic cuff it seems to be running high 449+ for systolic #. Pt said she isn't sure if BP is really that hight or if it's the cuff. [name] has a manuel cuff an when she takes it it's in normal range. Pt has a BP f/u on 11/17/20 and given she feels better but BP is still showing high according to her cuff not sure if it's okay to wait until that appt or does she need to be seen sooner, please advise

## 2020-10-25 NOTE — Telephone Encounter (Signed)
Pt notified of Dr. Marliss Coots comments

## 2020-10-26 DIAGNOSIS — Z96652 Presence of left artificial knee joint: Secondary | ICD-10-CM | POA: Diagnosis not present

## 2020-10-26 DIAGNOSIS — M25562 Pain in left knee: Secondary | ICD-10-CM | POA: Diagnosis not present

## 2020-10-26 DIAGNOSIS — M25662 Stiffness of left knee, not elsewhere classified: Secondary | ICD-10-CM | POA: Diagnosis not present

## 2020-10-26 DIAGNOSIS — R29898 Other symptoms and signs involving the musculoskeletal system: Secondary | ICD-10-CM | POA: Diagnosis not present

## 2020-10-27 ENCOUNTER — Ambulatory Visit: Payer: Medicare Other | Admitting: Family Medicine

## 2020-11-03 ENCOUNTER — Ambulatory Visit (INDEPENDENT_AMBULATORY_CARE_PROVIDER_SITE_OTHER): Payer: Medicare Other | Admitting: Dermatology

## 2020-11-03 ENCOUNTER — Other Ambulatory Visit: Payer: Self-pay

## 2020-11-03 DIAGNOSIS — Z1283 Encounter for screening for malignant neoplasm of skin: Secondary | ICD-10-CM

## 2020-11-03 DIAGNOSIS — L820 Inflamed seborrheic keratosis: Secondary | ICD-10-CM

## 2020-11-03 DIAGNOSIS — L570 Actinic keratosis: Secondary | ICD-10-CM

## 2020-11-03 DIAGNOSIS — Z872 Personal history of diseases of the skin and subcutaneous tissue: Secondary | ICD-10-CM

## 2020-11-03 DIAGNOSIS — L814 Other melanin hyperpigmentation: Secondary | ICD-10-CM

## 2020-11-03 DIAGNOSIS — L821 Other seborrheic keratosis: Secondary | ICD-10-CM

## 2020-11-03 DIAGNOSIS — L578 Other skin changes due to chronic exposure to nonionizing radiation: Secondary | ICD-10-CM | POA: Diagnosis not present

## 2020-11-03 DIAGNOSIS — D1800 Hemangioma unspecified site: Secondary | ICD-10-CM

## 2020-11-03 DIAGNOSIS — L853 Xerosis cutis: Secondary | ICD-10-CM

## 2020-11-03 DIAGNOSIS — D229 Melanocytic nevi, unspecified: Secondary | ICD-10-CM

## 2020-11-03 NOTE — Progress Notes (Signed)
New Patient Visit  Subjective  Erin Petersen is a 80 y.o. female who presents for the following: Annual Exam (Lesion on the L shoulder that is irregular and she would like checked today. She has a hx of AK's on the face. ). The patient presents for Total-Body Skin Exam (TBSE) for skin cancer screening and mole check.  The following portions of the chart were reviewed this encounter and updated as appropriate:   Tobacco  Allergies  Meds  Problems  Med Hx  Surg Hx  Fam Hx     Review of Systems:  No other skin or systemic complaints except as noted in HPI or Assessment and Plan.  Objective  Well appearing patient in no apparent distress; mood and affect are within normal limits.  A full examination was performed including scalp, head, eyes, ears, nose, lips, neck, chest, axillae, abdomen, back, buttocks, bilateral upper extremities, bilateral lower extremities, hands, feet, fingers, toes, fingernails, and toenails. All findings within normal limits unless otherwise noted below.  Nose tip x 2 (2) Erythematous thin papules/macules with gritty scale.   L deltoid x 1 Erythematous keratotic or waxy stuck-on papule or plaque.    Assessment & Plan  AK (actinic keratosis) (2) Nose tip x 2  Destruction of lesion - Nose tip x 2 Complexity: simple   Destruction method: cryotherapy   Informed consent: discussed and consent obtained   Timeout:  patient name, date of birth, surgical site, and procedure verified Lesion destroyed using liquid nitrogen: Yes   Region frozen until ice ball extended beyond lesion: Yes   Outcome: patient tolerated procedure well with no complications   Post-procedure details: wound care instructions given    Inflamed seborrheic keratosis L deltoid x 1  Destruction of lesion - L deltoid x 1 Complexity: simple   Destruction method: cryotherapy   Informed consent: discussed and consent obtained   Timeout:  patient name, date of birth, surgical site,  and procedure verified Lesion destroyed using liquid nitrogen: Yes   Region frozen until ice ball extended beyond lesion: Yes   Outcome: patient tolerated procedure well with no complications   Post-procedure details: wound care instructions given    Skin cancer screening  Lentigines - Scattered tan macules - Due to sun exposure - Benign-appering, observe - Recommend daily broad spectrum sunscreen SPF 30+ to sun-exposed areas, reapply every 2 hours as needed. - Call for any changes  Seborrheic Keratoses - Stuck-on, waxy, tan-brown papules and/or plaques  - Benign-appearing - Discussed benign etiology and prognosis. - Observe - Call for any changes  Melanocytic Nevi - Tan-brown and/or pink-flesh-colored symmetric macules and papules - Benign appearing on exam today - Observation - Call clinic for new or changing moles - Recommend daily use of broad spectrum spf 30+ sunscreen to sun-exposed areas.   Hemangiomas - Red papules - Discussed benign nature - Observe - Call for any changes  Actinic Damage - Chronic condition, secondary to cumulative UV/sun exposure - diffuse scaly erythematous macules with underlying dyspigmentation - Recommend daily broad spectrum sunscreen SPF 30+ to sun-exposed areas, reapply every 2 hours as needed.  - Staying in the shade or wearing long sleeves, sun glasses (UVA+UVB protection) and wide brim hats (4-inch brim around the entire circumference of the hat) are also recommended for sun protection.  - Call for new or changing lesions.  Xerosis - diffuse xerotic patches - recommend gentle, hydrating skin care - gentle skin care handout given  Skin cancer screening performed today.  Return in about 1 year (around 11/03/2021) for TBSE.  Luther Redo, CMA, am acting as scribe for Sarina Ser, MD . Documentation: I have reviewed the above documentation for accuracy and completeness, and I agree with the above.  Sarina Ser, MD

## 2020-11-03 NOTE — Patient Instructions (Addendum)
If you have any questions or concerns for your doctor, please call our main line at [phone] and press option 4 to reach your doctor's medical assistant. If no one answers, please leave a voicemail as directed and we will return your call as soon as possible. Messages left after 4 pm will be answered the following business day.   You may also send [place] a message via [place]. We typically respond to MyChart messages within 1-2 business days.  For prescription refills, please ask your pharmacy to contact our office. Our fax number is [phone].  If you have an urgent issue when the clinic is closed that cannot wait until the next business day, you can page your doctor at the number below.    Please note that while we do our best to be available for urgent issues outside of office hours, we are not available 24/7.   If you have an urgent issue and are unable to reach [place], you may choose to seek medical care at your doctor's office, retail clinic, urgent care center, or emergency room.  If you have a medical emergency, please immediately call 911 or go to the emergency department.  Pager Numbers  - Dr. Nehemiah Massed: [phone]  - Dr. Laurence Ferrari: [phone]  - Dr. Nicole Kindred: [phone]  In the event of inclement weather, please call our main line at [phone] for an update on the status of any delays or closures.  Dermatology Medication Tips: Please keep the boxes that topical medications come in in order to help keep track of the instructions about where and how to use these. Pharmacies typically print the medication instructions only on the boxes and not directly on the medication tubes.   If your medication is too expensive, please contact our office at [phone] option 4 or send [place] a message through [place].   We are unable to tell what your co-pay for medications will be in advance as this is different depending on your insurance coverage. However, we may be able to find a substitute  medication at lower cost or fill out paperwork to get insurance to cover a needed medication.   If a prior authorization is required to get your medication covered by your insurance company, please allow [place] 1-2 business days to complete this process.  Drug prices often vary depending on where the prescription is filled and some pharmacies may offer cheaper prices.  The website www.goodrx.com contains coupons for medications through different pharmacies. The prices here do not account for what the cost may be with help from insurance (it may be cheaper with your insurance), but the website can give you the price if you did not use any insurance.  - You can print the associated coupon and take it with your prescription to the pharmacy.  - You may also stop by our office during regular business hours and pick up a GoodRx coupon card.  - If you need your prescription sent electronically to a different pharmacy, notify our office through [facility] or by phone at [phone] option 4.   Gentle Skin Care Guide  1. Bathe no more than once a day.  2. Avoid bathing in hot water  3. Use a mild soap like Dove, Vanicream, Cetaphil, CeraVe. Can use Lever 2000 or Cetaphil antibacterial soap  4. Use soap only where you need it. On most days, use it under your arms, between your legs, and on your feet. Let the water rinse other areas unless visibly dirty.  5.  When you get out of the bath/shower, use a towel to gently blot your skin dry, don't rub it.  6. While your skin is still a little damp, apply a moisturizing cream such as Vanicream, CeraVe, Cetaphil, Eucerin, Sarna lotion or plain Vaseline Jelly. For hands apply Neutrogena [place] Hand Cream or Excipial Hand Cream.  7. Reapply moisturizer any time you start to itch or feel dry.  8. Sometimes using free and clear laundry detergents can be helpful. Fabric softener sheets should be avoided. Downy Free & Gentle liquid, or any liquid fabric  softener that is free of dyes and perfumes, it acceptable to use  9. If your doctor has given you prescription creams you may apply moisturizers over them

## 2020-11-04 ENCOUNTER — Ambulatory Visit: Payer: Medicare Other | Admitting: Podiatry

## 2020-11-04 DIAGNOSIS — E119 Type 2 diabetes mellitus without complications: Secondary | ICD-10-CM | POA: Diagnosis not present

## 2020-11-04 DIAGNOSIS — L600 Ingrowing nail: Secondary | ICD-10-CM

## 2020-11-04 LAB — HM DIABETES EYE EXAM

## 2020-11-05 DIAGNOSIS — M25562 Pain in left knee: Secondary | ICD-10-CM | POA: Diagnosis not present

## 2020-11-05 DIAGNOSIS — Z96652 Presence of left artificial knee joint: Secondary | ICD-10-CM | POA: Diagnosis not present

## 2020-11-07 ENCOUNTER — Encounter: Payer: Self-pay | Admitting: Dermatology

## 2020-11-10 ENCOUNTER — Encounter: Payer: Self-pay | Admitting: Podiatry

## 2020-11-10 NOTE — Progress Notes (Signed)
Subjective:  Patient ID: Erin Petersen, female    DOB: 10/22/1940,  MRN: [id number]  Chief Complaint  Patient presents with   Nail Problem    Nail bed growing back     80 y.o. female presents with the above complaint.  Patient presents with complaint of left hallux lateral border ingrown.  Patient states is painful to touch.  She had a removed in the past by me however seems like it may be growing back again.  She would like for me to have it removed again.  She had the other side done which seems to be doing well.  She denies any other acute complaints.  She would like to discuss treatment options for this.  She has not done any self debridement   Review of Systems: Negative except as noted in the HPI. Denies N/V/F/Ch.  Past Medical History:  Diagnosis Date   Arthritis    Asthma    exercise induced asthma   Bacteremia due to Gram-negative bacteria 06/04/2019   Colon polyps    Diabetes mellitus without complication (HCC)    Duodenal ulcer    Dyspnea    GERD (gastroesophageal reflux disease)    Hyperlipidemia    Hypertension    Neuropathy    right leg and foot   Plantar fasciitis    Pre-diabetes    Prediabetes    RLQ abdominal pain    RLS (restless legs syndrome)    Sleep apnea    CPAP   SOB (shortness of breath)    Urinary incontinence    Urinary tract infection    Wears hearing aid in right ear     Current Outpatient Medications:    albuterol (VENTOLIN HFA) 108 (90 Base) MCG/ACT inhaler, Inhale 2 puffs into the lungs every 6 (six) hours as needed for wheezing or shortness of breath., Disp: , Rfl:    amitriptyline (ELAVIL) 25 MG tablet, Take 1 tablet (25 mg total) by mouth at bedtime., Disp: 90 tablet, Rfl: 3   aspirin 81 MG EC tablet, Take 81 mg by mouth at bedtime. , Disp: , Rfl:    Blood Glucose Monitoring Suppl (ONETOUCH VERIO) w/Device KIT, To check glucose daily and prn for DM2, Disp: 1 kit, Rfl: 0   Cholecalciferol (VITAMIN D3) 50 MCG (2000 UT) TABS, Take  2,000 Units by mouth daily., Disp: , Rfl:    Cyanocobalamin (VITAMIN B-12) 1000 MCG SUBL, Take 2,000 mcg by mouth daily., Disp: , Rfl:    docusate sodium (COLACE) 100 MG capsule, Take 1 capsule (100 mg total) by mouth 2 (two) times daily., Disp: 10 capsule, Rfl: 0   enoxaparin (LOVENOX) 40 MG/0.4ML injection, Inject 0.4 mLs (40 mg total) into the skin daily., Disp: 5.6 mL, Rfl: 0   fluticasone (FLONASE) 50 MCG/ACT nasal spray, Place 2 sprays into both nostrils daily. (Patient taking differently: Place 2 sprays into both nostrils at bedtime.), Disp: 16 g, Rfl: 3   glucose blood (ONETOUCH VERIO) test strip, To check glucose daily and prn for DM2, Disp: 100 each, Rfl: 3   hydrochlorothiazide (HYDRODIURIL) 25 MG tablet, Take 1 tablet (25 mg total) by mouth daily., Disp: 90 tablet, Rfl: 3   HYDROcodone-acetaminophen (NORCO/VICODIN) 5-325 MG tablet, Take 1 tablet by mouth every 4 (four) hours as needed for moderate pain (pain score 4-6)., Disp: 30 tablet, Rfl: 0   hydrOXYzine (ATARAX/VISTARIL) 25 MG tablet, Take 1 tablet (25 mg total) by mouth at bedtime., Disp: 90 tablet, Rfl: 3   Lancets [facility]  PLUS LANCET33G) MISC, To check glucose daily and prn for DM2, Disp: 100 each, Rfl: 3   LINZESS 145 MCG CAPS capsule, TAKE 1 CAPSULE DAILY BEFORE BREAKFAST (Patient taking differently: Take 145 mcg by mouth daily before breakfast.), Disp: 90 capsule, Rfl: 3   MELATONIN PO, Take 1 tablet by mouth at bedtime., Disp: , Rfl:    omeprazole (PRILOSEC) 40 MG capsule, Take 40 mg by mouth in the morning., Disp: , Rfl:    ondansetron (ZOFRAN) 4 MG tablet, Take 1 tablet (4 mg total) by mouth every 8 (eight) hours as needed for nausea or vomiting., Disp: 20 tablet, Rfl: 0   polyethylene glycol (MIRALAX / GLYCOLAX) 17 g packet, Take 1 packet by mouth daily. , Disp: , Rfl:    pregabalin (LYRICA) 150 MG capsule, Take 1 capsule (150 mg total) by mouth 2 (two) times daily. (Patient taking differently: Take 150 mg by mouth  See admin instructions. Take 150 mg by mouth in the morning and  300 mg in the afternoon), Disp: 20 capsule, Rfl: 0   rOPINIRole (REQUIP) 1 MG tablet, TAKE 1 TABLET IN THE MORNING AND 1 TABLET IN THE AFTERNOON (Patient taking differently: Take 1 mg by mouth 2 (two) times daily with breakfast and lunch.), Disp: 180 tablet, Rfl: 2   rOPINIRole (REQUIP) 4 MG tablet, Take 1 tablet (4 mg total) by mouth at bedtime., Disp: 90 tablet, Rfl: 3   senna-docusate (SENOKOT-S) 8.6-50 MG tablet, Take 1 tablet by mouth 2 (two) times daily., Disp: 30 tablet, Rfl: 0   simvastatin (ZOCOR) 20 MG tablet, Take 1 tablet (20 mg total) by mouth at bedtime., Disp: 90 tablet, Rfl: 3   traMADol (ULTRAM) 50 MG tablet, Take 1 tablet (50 mg total) by mouth every 6 (six) hours as needed., Disp: 30 tablet, Rfl: 0   TURMERIC PO, Take 1,000 mg by mouth daily., Disp: , Rfl:   Social History   Tobacco Use  Smoking Status Never  Smokeless Tobacco Never    Allergies  Allergen Reactions   Bee Venom Anaphylaxis and Hives   Fosamax [Alendronate] Nausea Only   Ciprofloxacin Hcl Rash    Mild rash after long term use   Sulfa Antibiotics Rash    Bactrim   Objective:  There were no vitals filed for this visit. There is no height or weight on file to calculate BMI. Constitutional Well developed. Well nourished.  Vascular Dorsalis pedis pulses palpable bilaterally. Posterior tibial pulses palpable bilaterally. Capillary refill normal to all digits.  No cyanosis or clubbing noted. Pedal hair growth normal.  Neurologic Normal speech. Oriented to person, place, and time. Epicritic sensation to light touch grossly present bilaterally.  Dermatologic Painful ingrowing nail at lateral nail borders of the hallux nail left. No other open wounds. No skin lesions.  Orthopedic: Normal joint ROM without pain or crepitus bilaterally. No visible deformities. No bony tenderness.   Radiographs: None Assessment:   1. Ingrown left big  toenail    Plan:  Patient was evaluated and treated and all questions answered.  Ingrown Nail, left~recurrence -Patient elects to proceed with minor surgery to remove ingrown toenail removal today. Consent reviewed and signed by patient. -Ingrown nail excised. See procedure note. -Educated on post-procedure care including soaking. Written instructions provided and reviewed. -Patient to follow up in 2 weeks for nail check.  Procedure: Excision of Ingrown Toenail Location: Left 1st toe lateral nail borders. Anesthesia: Lidocaine 1% plain; 1.5 mL and Marcaine 0.5% plain; 1.5 mL, digital block. Skin  Prep: Betadine. Dressing: Silvadene; telfa; dry, sterile, compression dressing. Technique: Following skin prep, the toe was exsanguinated and a tourniquet was secured at the base of the toe. The affected nail border was freed, split with a nail splitter, and excised. Chemical matrixectomy was then performed with phenol and irrigated out with alcohol. The tourniquet was then removed and sterile dressing applied. Disposition: Patient tolerated procedure well. Patient to return in 2 weeks for follow-up.   No follow-ups on file.

## 2020-11-11 ENCOUNTER — Ambulatory Visit: Payer: Medicare Other

## 2020-11-11 DIAGNOSIS — J386 Stenosis of larynx: Secondary | ICD-10-CM | POA: Diagnosis not present

## 2020-11-12 ENCOUNTER — Encounter: Payer: Self-pay | Admitting: Family Medicine

## 2020-11-17 ENCOUNTER — Other Ambulatory Visit: Payer: Self-pay

## 2020-11-17 ENCOUNTER — Ambulatory Visit (INDEPENDENT_AMBULATORY_CARE_PROVIDER_SITE_OTHER): Payer: Medicare Other | Admitting: Family Medicine

## 2020-11-17 VITALS — BP 148/80 | HR 72 | Temp 96.9°F | Ht 58.25 in | Wt 163.4 lb

## 2020-11-17 DIAGNOSIS — G4733 Obstructive sleep apnea (adult) (pediatric): Secondary | ICD-10-CM

## 2020-11-17 DIAGNOSIS — E785 Hyperlipidemia, unspecified: Secondary | ICD-10-CM | POA: Insufficient documentation

## 2020-11-17 DIAGNOSIS — R7303 Prediabetes: Secondary | ICD-10-CM | POA: Diagnosis not present

## 2020-11-17 DIAGNOSIS — E1165 Type 2 diabetes mellitus with hyperglycemia: Secondary | ICD-10-CM

## 2020-11-17 DIAGNOSIS — Z96652 Presence of left artificial knee joint: Secondary | ICD-10-CM | POA: Diagnosis not present

## 2020-11-17 DIAGNOSIS — R42 Dizziness and giddiness: Secondary | ICD-10-CM | POA: Diagnosis not present

## 2020-11-17 DIAGNOSIS — I10 Essential (primary) hypertension: Secondary | ICD-10-CM

## 2020-11-17 DIAGNOSIS — M1712 Unilateral primary osteoarthritis, left knee: Secondary | ICD-10-CM | POA: Diagnosis not present

## 2020-11-17 DIAGNOSIS — E7800 Pure hypercholesterolemia, unspecified: Secondary | ICD-10-CM

## 2020-11-17 DIAGNOSIS — Z23 Encounter for immunization: Secondary | ICD-10-CM | POA: Diagnosis not present

## 2020-11-17 DIAGNOSIS — Z9989 Dependence on other enabling machines and devices: Secondary | ICD-10-CM

## 2020-11-17 DIAGNOSIS — R3982 Chronic bladder pain: Secondary | ICD-10-CM

## 2020-11-17 LAB — POCT GLYCOSYLATED HEMOGLOBIN (HGB A1C): Hemoglobin A1C: 6.2 % — AB (ref 4.0–5.6)

## 2020-11-17 MED ORDER — AMLODIPINE BESYLATE 2.5 MG PO TABS: 2.5000 mg | ORAL_TABLET | Freq: Every day | ORAL | 0 refills | Status: DC

## 2020-11-17 MED ORDER — AMLODIPINE BESYLATE 2.5 MG PO TABS
2.5000 mg | ORAL_TABLET | Freq: Every day | ORAL | 3 refills | Status: DC
Start: 2020-11-17 — End: 2020-11-17

## 2020-11-17 NOTE — Assessment & Plan Note (Signed)
Suspect multi factorial Pt thinks elevated bp plays a role (it has been low in the past)  MRI brain rev (? Of poss tia in the past, pt has neuro f/u for this soon) Takes multiple sedating medications incl lyrica for neuropathy requip- RLS Hydroxyzine-bladder (plans to stop) Amitriptyline -would consider weaning Tylenol with codeine-will be off soon Methocarbamol-temp from ortho  She has neuro f/u soon and will bring up  Nl exam today

## 2020-11-17 NOTE — Assessment & Plan Note (Signed)
Improved a1c Lab Results  Component Value Date   HGBA1C 6.2 (A) 11/17/2020   Commended good lifestyle change effort and wt loss disc imp of low glycemic diet and wt loss to prevent DM2  Will continue to follow F/u 3 mo

## 2020-11-17 NOTE — Assessment & Plan Note (Signed)
Pt still has day time somnolence Plans to check with neuro  Of note her daughter has narcolepsy

## 2020-11-17 NOTE — Assessment & Plan Note (Signed)
Hydroxyzine does not help She plans to stop it

## 2020-11-17 NOTE — Assessment & Plan Note (Signed)
bp is now consistently elevated with pt's new cuff at home She thinks dizziness is related  BP: (!) 148/80    Would like to try amlodipine 2.5 mg daily (5 mg was too much) Good health habits and more active also  She will check bp at home and update  F/u about 3 mo

## 2020-11-17 NOTE — Patient Instructions (Addendum)
Go ahead and start amlodipine (lower dose) at 2.5 mg daily  If you get low BP or you feel bad stop it   (cut the 5 mg in 1/2)   Stop the hydroxyzine -it may worsen dizziness  You can consider reducing/weaning off of amitriptyline as well  It is for sleep and chronic pain and neuropathy   Use caution with lyrica and requip-they can also cause dizziness Tylenol with codiene can cause dizziness and nausea (hopefully you will be off of it also)  Methocarbamol can cause sedation and dizziness also   At your next neurology appt. Bring up the sleepiness/falling asleep again   Good job with diet/exercise    Follow up in about 3 months

## 2020-11-17 NOTE — Assessment & Plan Note (Signed)
Disc goals for lipids and reasons to control them Rev last labs with pt Rev low sat fat diet in detail

## 2020-11-17 NOTE — Progress Notes (Signed)
Subjective:    Patient ID: Erin Petersen, female    DOB: 02/06/1941, 80 y.o.   MRN: [id number]  This visit occurred during the SARS-CoV-2 public health emergency.  Safety protocols were in place, including screening questions prior to the visit, additional usage of staff PPE, and extensive cleaning of exam room while observing appropriate contact time as indicated for disinfecting solutions.   HPI Pt presents for f/u of HTN and DM2 and dizziness   Wt Readings from Last 3 Encounters:  11/17/20 163 lb 6 oz (74.1 kg)  10/21/20 200 lb 9.9 oz (91 kg)  10/13/20 200 lb 9.9 oz (91 kg)   33.85 kg/m Weight at home 161   (the 200 was not accurate)  Was 172 for surgery   Had a knee replacement in august  In PT  Feeling good  Not using a cane  Pain level is about a 6 (per pt it is a lot)-trying to get off pain medication  Has f/u with ortho this afternoon   HTN Previously on amlodipine but this caused hypotension  Taking hctz  25 mg daily   BP Readings from Last 3 Encounters:  11/17/20 (!) 148/80  10/21/20 (!) 182/83  10/14/20 (!) 149/60   New cuff is more accurate   Pulse Readings from Last 3 Encounters:  11/17/20 72  10/21/20 77  10/14/20 80     DM2 Lab Results  Component Value Date   HGBA1C 6.7 (H) 08/10/2020   A1c today: improved at 6.2  (prediabetes)  She is working hard on diet , eating fruit instead of sweets  PT for exercise / more active   Last time referral was done for DM teaching /her medicare program has some classes for her she is interested in  She checks her blood sugar - usually 130s 150, occ lower  No glycemic medicines  Taking a statin   Sees neurology for neuropathy   Cannot take high dose flu shot /wants the regular one  Lab Results  Component Value Date   CHOL 163 08/10/2020   HDL 36.20 (L) 08/10/2020   LDLCALC 67 03/12/2020   LDLDIRECT 90.0 08/10/2020   TRIG 368.0 (H) 08/10/2020   CHOLHDL 5 08/10/2020      Lab Results   Component Value Date   CREATININE 0.71 10/21/2020   BUN 14 10/21/2020   NA 130 (L) 10/21/2020   K 3.6 10/21/2020   CL 93 (L) 10/21/2020   CO2 25 10/21/2020    H/o dizzy episodes /light headed and occ nausea  Episodic  She manages to push through most of the time  About 4 times per week   Last MRI brain  MR BRAIN WO CONTRAST (Accession [id number]) (Order [id number]) Imaging Date: 05/04/2020 Department: [facility] Released By: Teryl Lucy Authorizing: Teodoro Spray, MD   Exam Status  Status  Final [99]   PACS Intelerad Image Link   Show images for MR BRAIN WO CONTRAST  Study Result  Narrative & Impression  CLINICAL DATA:  TIA; technologist note states episode of sudden onset double vision, word finding difficulty   EXAM: MRI HEAD WITHOUT CONTRAST   TECHNIQUE: Multiplanar, multiecho pulse sequences of the brain and surrounding structures were obtained without intravenous contrast.   COMPARISON:  October 2020   FINDINGS: Brain: There is no acute infarction or intracranial hemorrhage. There is no intracranial mass, mass effect, or edema. There is no hydrocephalus or extra-axial fluid collection. Small chronic infarct of the  left corona radiata. Small chronic infarct of the right cerebellum. Minimal additional patchy T2 hyperintensity in the supratentorial white matter is nonspecific but may reflect minor chronic microvascular ischemic changes.   Vascular: Major vessel flow voids at the skull base are preserved.   Skull and upper cervical spine: Normal marrow signal is preserved.   Sinuses/Orbits: Paranasal sinuses are aerated. Orbits are unremarkable.   Other: Sella is unremarkable.  Mastoid air cells are clear.   IMPRESSION: No evidence of recent infarction, hemorrhage, or mass. Minor chronic microvascular ischemic changes and two chronic small vessel infarcts. No change since October 2020.   ? Tia in the past /episode  of word finding   Has f/u with neuro soon    Medications that can cause dizziness include Lyrica Requip Hydroxyzine Amitriptyline  Pain medication (tylenol with codiene)   Methocarbamol for leg cramps (temporary, after knee replacement)   Falls asleep easily despite cpap  Daughter has narcolepsy    Patient Active Problem List   Diagnosis Date Noted   Hyperlipidemia 11/17/2020   S/P TKR (total knee replacement) using cement, left 10/12/2020   Somnolence, daytime 09/09/2020   Rash 09/09/2020   Medicare annual wellness visit, subsequent 08/25/2020   Encounter for screening mammogram for breast cancer 08/25/2020   Encounter for general adult medical examination with abnormal findings 08/25/2020   GERD (gastroesophageal reflux disease) 08/25/2020   Dizzy spells 08/25/2020   Migraine 03/12/2020   IBS (irritable bowel syndrome) 03/12/2020   S/P TKR (total knee replacement) using cement, right 01/01/2020   History of vaginal hysterectomy 06/11/2019   Estrogen deficiency 04/22/2019   History of skin cancer 04/22/2019   High-tone pelvic floor dysfunction 04/03/2019   IC (interstitial cystitis) 04/03/2019   Neuralgia of both pudendal nerves 04/03/2019   Pelvic pain in female 04/03/2019   Osteoporosis 02/12/2019   Asthma 01/31/2019   Adverse reaction to influenza vaccine, initial encounter 12/03/2018   Vitamin D deficiency 12/03/2018   Restless legs syndrome 11/28/2018   Small fiber neuropathy 11/28/2018   Allergic rhinitis 10/08/2018   Benign essential hypertension 10/08/2018   Chronic bladder pain 10/08/2018   Neuropathy 10/08/2018   Osteoarthritis 10/08/2018   S/P BSO (bilateral salpingo-oophorectomy) 01/07/2018   OSA on CPAP 12/29/2016   Other and unspecified disc disorder of lumbar region 10/25/2000   Prediabetes 09/24/1998   Tinnitus of both ears 09/24/1983   Past Medical History:  Diagnosis Date   Arthritis    Asthma    exercise induced asthma   Bacteremia due  to Gram-negative bacteria 06/04/2019   Colon polyps    Diabetes mellitus without complication (HCC)    Duodenal ulcer    Dyspnea    GERD (gastroesophageal reflux disease)    Hyperlipidemia    Hypertension    Neuropathy    right leg and foot   Plantar fasciitis    Pre-diabetes    Prediabetes    RLQ abdominal pain    RLS (restless legs syndrome)    Sleep apnea    CPAP   SOB (shortness of breath)    Urinary incontinence    Urinary tract infection    Wears hearing aid in right ear    Past Surgical History:  Procedure Laterality Date   [place]  2002   lumbar fusion   BILATERAL SALPINGOOPHORECTOMY  01/07/2018   BIOPSY  04/15/2019   Procedure: BIOPSY;  Surgeon: [name], MD;  Location: [facility];  Service: Endoscopy;;   CARDIAC CATHETERIZATION  2019   COLONOSCOPY WITH PROPOFOL N/A 11/11/2018   Procedure: COLONOSCOPY WITH PROPOFOL;  Surgeon: [name], MD;  [facility] END[facility];  Service: Gastroenterology;  Laterality: N/A;   CYSTO WITH HYDRODISTENSION N/A 12/30/2018   Procedure: CYSTOSCOPY/HYDRODISTENSION WITH MARCAINE;  Surgeon: Bjorn Loser, MD;  Location: [facility];  Service: Urology;  Laterality: N/A;   ESOPHAGOGASTRODUODENOSCOPY (EGD) WITH PROPOFOL N/A 04/15/2019   Procedure: ESOPHAGOGASTRODUODENOSCOPY (EGD) WITH PROPOFOL;  Surgeon: [name], MD;  Location: [facility];  Service: Endoscopy;  Laterality: N/A;  sleep apnea   ESOPHAGOGASTRODUODENOSCOPY (EGD) WITH PROPOFOL N/A 07/09/2019   Procedure: ESOPHAGOGASTRODUODENOSCOPY (EGD) WITH PROPOFOL;  Surgeon: [name], MD;  Location: [facility];  Service: Gastroenterology;  Laterality: N/A;   EYE SURGERY Bilateral    HERNIA REPAIR  2018   abd   HERNIA REPAIR     umbilical   INGUINAL HERNIA REPAIR Bilateral    inguinal   JOINT REPLACEMENT     LAPAROSCOPIC BILATERAL SALPINGO OOPHERECTOMY  2019    SPINAL FUSION  2002   TONSILLECTOMY AND ADENOIDECTOMY  1961   TOTAL KNEE ARTHROPLASTY Right 01/01/2020   Procedure: Right total knee arthroplasty - [name] to Assist;  Surgeon: Hessie Knows, MD;  Location: [facility];  Service: Orthopedics;  Laterality: Right;   TOTAL KNEE ARTHROPLASTY Left 10/12/2020   Procedure: TOTAL KNEE ARTHROPLASTY;  Surgeon: Hessie Knows, MD;  Location: [facility];  Service: Orthopedics;  Laterality: Left;   TUBAL LIGATION     Social History   Tobacco Use   Smoking status: Never   Smokeless tobacco: Never  Vaping Use   Vaping Use: Never used  Substance Use Topics   Alcohol use: Not Currently   Drug use: Never   Family History  Problem Relation Age of Onset   Alcohol abuse Mother    Cancer Mother    Stroke Mother    Alcohol abuse Father    Arthritis Sister    Arthritis Daughter    COPD Daughter    Arthritis Paternal Grandmother    Asthma Paternal Grandmother    COPD Paternal Grandmother    Breast cancer Paternal Aunt    Allergies  Allergen Reactions   Bee Venom Anaphylaxis and Hives   Fosamax [Alendronate] Nausea Only   Ciprofloxacin Hcl Rash    Mild rash after long term use   Sulfa Antibiotics Rash    Bactrim   Current Outpatient Medications on File Prior to Visit  Medication Sig Dispense Refill   albuterol (VENTOLIN HFA) 108 (90 Base) MCG/ACT inhaler Inhale 2 puffs into the lungs every 6 (six) hours as needed for wheezing or shortness of breath.     amitriptyline (ELAVIL) 25 MG tablet Take 1 tablet (25 mg total) by mouth at bedtime. 90 tablet 3   aspirin 81 MG EC tablet Take 81 mg by mouth at bedtime.      Blood Glucose Monitoring Suppl (ONETOUCH VERIO) w/Device KIT To check glucose daily and prn for DM2 1 kit 0   Cholecalciferol (VITAMIN D3) 50 MCG (2000 UT) TABS Take 2,000 Units by mouth daily.     Cyanocobalamin (VITAMIN B-12) 1000 MCG SUBL Take 2,000 mcg by mouth daily.     docusate sodium (COLACE) 100 MG capsule Take 1 capsule (100 mg  total) by mouth 2 (two) times daily. 10 capsule 0   fluticasone (FLONASE) 50 MCG/ACT nasal spray Place 2 sprays into both nostrils daily. (Patient taking differently: Place 2 sprays into  both nostrils at bedtime.) 16 g 3   glucose blood (ONETOUCH VERIO) test strip To check glucose daily and prn for DM2 100 each 3   hydrochlorothiazide (HYDRODIURIL) 25 MG tablet Take 1 tablet (25 mg total) by mouth daily. 90 tablet 3   HYDROcodone-acetaminophen (NORCO/VICODIN) 5-325 MG tablet Take 1 tablet by mouth every 4 (four) hours as needed for moderate pain (pain score 4-6). 30 tablet 0   Lancets (ONETOUCH DELICA PLUS [id number]) MISC To check glucose daily and prn for DM2 100 each 3   LINZESS 145 MCG CAPS capsule TAKE 1 CAPSULE DAILY BEFORE BREAKFAST (Patient taking differently: Take 145 mcg by mouth daily before breakfast.) 90 capsule 3   MELATONIN PO Take 1 tablet by mouth at bedtime.     omeprazole (PRILOSEC) 40 MG capsule Take 40 mg by mouth in the morning.     ondansetron (ZOFRAN) 4 MG tablet Take 1 tablet (4 mg total) by mouth every 8 (eight) hours as needed for nausea or vomiting. 20 tablet 0   polyethylene glycol (MIRALAX / GLYCOLAX) 17 g packet Take 1 packet by mouth daily.      pregabalin (LYRICA) 150 MG capsule Take 1 capsule (150 mg total) by mouth 2 (two) times daily. (Patient taking differently: Take 150 mg by mouth See admin instructions. Take 150 mg by mouth in the morning and  300 mg in the afternoon) 20 capsule 0   rOPINIRole (REQUIP) 1 MG tablet TAKE 1 TABLET IN THE MORNING AND 1 TABLET IN THE AFTERNOON (Patient taking differently: Take 1 mg by mouth 2 (two) times daily with breakfast and lunch.) 180 tablet 2   rOPINIRole (REQUIP) 4 MG tablet Take 1 tablet (4 mg total) by mouth at bedtime. 90 tablet 3   simvastatin (ZOCOR) 20 MG tablet Take 1 tablet (20 mg total) by mouth at bedtime. 90 tablet 3   TURMERIC PO Take 1,000 mg by mouth daily.     No current facility-administered medications on  file prior to visit.     Review of Systems  Constitutional:  Positive for fatigue. Negative for activity change, appetite change, fever and unexpected weight change.  HENT:  Negative for congestion, ear pain, rhinorrhea, sinus pressure and sore throat.   Eyes:  Negative for pain, redness and visual disturbance.  Respiratory:  Negative for cough, shortness of breath and wheezing.   Cardiovascular:  Negative for chest pain and palpitations.  Gastrointestinal:  Negative for abdominal pain, blood in stool, constipation and diarrhea.  Endocrine: Negative for polydipsia and polyuria.  Genitourinary:  Negative for dysuria, frequency and urgency.  Musculoskeletal:  Negative for arthralgias, back pain and myalgias.  Skin:  Negative for pallor and rash.  Allergic/Immunologic: Negative for environmental allergies.  Neurological:  Positive for light-headedness. Negative for dizziness, tremors, syncope, speech difficulty, weakness, numbness and headaches.       Falls asleep easily when sitting   Hematological:  Negative for adenopathy. Does not bruise/bleed easily.  Psychiatric/Behavioral:  Positive for sleep disturbance. Negative for decreased concentration and dysphoric mood. The patient is not nervous/anxious.       Objective:   Physical Exam Constitutional:      General: She is not in acute distress.    Appearance: Normal appearance. She is well-developed. She is obese. She is not ill-appearing.  HENT:     Head: Normocephalic and atraumatic.     Mouth/Throat:     Mouth: Mucous membranes are moist.  Eyes:     Conjunctiva/sclera: Conjunctivae normal.  Pupils: Pupils are equal, round, and reactive to light.  Neck:     Thyroid: No thyromegaly.     Vascular: No carotid bruit or JVD.  Cardiovascular:     Rate and Rhythm: Normal rate and regular rhythm.     Heart sounds: Normal heart sounds.    No gallop.  Pulmonary:     Effort: Pulmonary effort is normal. No respiratory distress.      Breath sounds: Normal breath sounds. No wheezing or rales.  Abdominal:     General: There is no abdominal bruit.  Musculoskeletal:     Cervical back: Normal range of motion and neck supple.     Right lower leg: No edema.     Left lower leg: No edema.  Lymphadenopathy:     Cervical: No cervical adenopathy.  Skin:    General: Skin is warm and dry.     Coloration: Skin is not pale.     Findings: No bruising, erythema or rash.  Neurological:     Mental Status: She is alert.     Cranial Nerves: No cranial nerve deficit.     Sensory: No sensory deficit.     Motor: No weakness.     Coordination: Coordination normal.     Gait: Gait normal.     Deep Tendon Reflexes: Reflexes are normal and symmetric. Reflexes normal.  Psychiatric:        Mood and Affect: Mood normal.        Cognition and Memory: Cognition and memory normal.          Assessment & Plan:   Problem List Items Addressed This Visit       Cardiovascular and Mediastinum   Benign essential hypertension    bp is now consistently elevated with pt's new cuff at home She thinks dizziness is related  BP: (!) 148/80    Would like to try amlodipine 2.5 mg daily (5 mg was too much) Good health habits and more active also  She will check bp at home and update  F/u about 3 mo       Relevant Medications   amLODipine (NORVASC) 2.5 MG tablet     Respiratory   OSA on CPAP    Pt still has day time somnolence Plans to check with neuro  Of note her daughter has narcolepsy        Endocrine   RESOLVED: DM type 2 (diabetes mellitus, type 2) ([place])   Relevant Orders   POCT glycosylated hemoglobin (Hb A1C) (Completed)     Other   Chronic bladder pain    Hydroxyzine does not help She plans to stop it       Prediabetes - Primary    Improved a1c Lab Results  Component Value Date   HGBA1C 6.2 (A) 11/17/2020   Commended good lifestyle change effort and wt loss disc imp of low glycemic diet and wt loss to prevent DM2   Will continue to follow F/u 3 mo      Dizzy spells    Suspect multi factorial Pt thinks elevated bp plays a role (it has been low in the past)  MRI brain rev (? Of poss tia in the past, pt has neuro f/u for this soon) Takes multiple sedating medications incl lyrica for neuropathy requip- RLS Hydroxyzine-bladder (plans to stop) Amitriptyline -would consider weaning Tylenol with codeine-will be off soon Methocarbamol-temp from ortho  She has neuro f/u soon and will bring up  Nl exam today  Hyperlipidemia    Disc goals for lipids and reasons to control them Rev last labs with pt Rev low sat fat diet in detail       Relevant Medications   amLODipine (NORVASC) 2.5 MG tablet   Other Visit Diagnoses     Need for influenza vaccination       Relevant Orders   Flu Vaccine QUAD 6+ mos PF IM (Fluarix Quad PF) (Completed)

## 2020-11-18 ENCOUNTER — Telehealth: Payer: Self-pay | Admitting: *Deleted

## 2020-11-18 ENCOUNTER — Ambulatory Visit: Payer: Medicare Other

## 2020-11-18 NOTE — Telephone Encounter (Signed)
Patient reports that she saw her MD and recent A1C is 6.2%. Her doctor has told her that she is now pre-diabetes and has removed diagnosis of diabetes. Her insurance will not cover classes or an appointment with the dietitian without Diabetes diagnosis. Will discharge. Instructed her to call back if she has any questions.

## 2020-11-19 ENCOUNTER — Encounter: Payer: Self-pay | Admitting: *Deleted

## 2020-11-19 DIAGNOSIS — G4733 Obstructive sleep apnea (adult) (pediatric): Secondary | ICD-10-CM | POA: Diagnosis not present

## 2020-11-19 DIAGNOSIS — Z96652 Presence of left artificial knee joint: Secondary | ICD-10-CM | POA: Diagnosis not present

## 2020-11-19 DIAGNOSIS — M25562 Pain in left knee: Secondary | ICD-10-CM | POA: Diagnosis not present

## 2020-11-24 DIAGNOSIS — I10 Essential (primary) hypertension: Secondary | ICD-10-CM | POA: Diagnosis not present

## 2020-11-24 DIAGNOSIS — R0602 Shortness of breath: Secondary | ICD-10-CM | POA: Diagnosis not present

## 2020-11-24 DIAGNOSIS — G4733 Obstructive sleep apnea (adult) (pediatric): Secondary | ICD-10-CM | POA: Diagnosis not present

## 2020-11-24 DIAGNOSIS — Z9989 Dependence on other enabling machines and devices: Secondary | ICD-10-CM | POA: Diagnosis not present

## 2020-12-02 ENCOUNTER — Ambulatory Visit: Payer: Medicare Other

## 2020-12-08 ENCOUNTER — Telehealth: Payer: Self-pay | Admitting: Family Medicine

## 2020-12-08 NOTE — Chronic Care Management (AMB) (Signed)
  Chronic Care Management   Note  12/08/2020 Name: Erin Petersen MRN: [id number] DOB: Jan 14, 1941  Erin Petersen is a 80 y.o. year old female who is a primary care patient of Tower, Wynelle Fanny, MD. I reached out to [employer] by phone today in response to a referral sent by Erin Petersen PCP, Tower, Wynelle Fanny, MD.   Erin Petersen was given information about Chronic Care Management services today including:  CCM service includes personalized support from designated clinical staff supervised by her physician, including individualized plan of care and coordination with other care providers 24/7 contact phone numbers for assistance for urgent and routine care needs. Service will only be billed when office clinical staff spend 20 minutes or more in a month to coordinate care. Only one practitioner may furnish and bill the service in a calendar month. The patient may stop CCM services at any time (effective at the end of the month) by phone call to the office staff.   Patient agreed to services and verbal consent obtained.   Follow up plan:   Tatjana Secretary/administrator

## 2020-12-16 ENCOUNTER — Ambulatory Visit: Payer: Medicare Other

## 2020-12-23 ENCOUNTER — Ambulatory Visit: Payer: Medicare Other

## 2020-12-30 ENCOUNTER — Other Ambulatory Visit: Payer: Self-pay | Admitting: *Deleted

## 2020-12-30 NOTE — Telephone Encounter (Signed)
error 

## 2020-12-31 DIAGNOSIS — M25562 Pain in left knee: Secondary | ICD-10-CM | POA: Diagnosis not present

## 2020-12-31 DIAGNOSIS — Z96652 Presence of left artificial knee joint: Secondary | ICD-10-CM | POA: Diagnosis not present

## 2020-12-31 DIAGNOSIS — R29898 Other symptoms and signs involving the musculoskeletal system: Secondary | ICD-10-CM | POA: Diagnosis not present

## 2020-12-31 DIAGNOSIS — M25662 Stiffness of left knee, not elsewhere classified: Secondary | ICD-10-CM | POA: Diagnosis not present

## 2021-01-05 DIAGNOSIS — Z20822 Contact with and (suspected) exposure to covid-19: Secondary | ICD-10-CM | POA: Diagnosis not present

## 2021-01-10 DIAGNOSIS — Z96652 Presence of left artificial knee joint: Secondary | ICD-10-CM | POA: Diagnosis not present

## 2021-01-10 DIAGNOSIS — M25562 Pain in left knee: Secondary | ICD-10-CM | POA: Diagnosis not present

## 2021-01-14 DIAGNOSIS — Z96652 Presence of left artificial knee joint: Secondary | ICD-10-CM | POA: Diagnosis not present

## 2021-01-14 DIAGNOSIS — M25562 Pain in left knee: Secondary | ICD-10-CM | POA: Diagnosis not present

## 2021-01-17 DIAGNOSIS — M25562 Pain in left knee: Secondary | ICD-10-CM | POA: Diagnosis not present

## 2021-01-17 DIAGNOSIS — Z96652 Presence of left artificial knee joint: Secondary | ICD-10-CM | POA: Diagnosis not present

## 2021-01-19 DIAGNOSIS — Z8616 Personal history of COVID-19: Secondary | ICD-10-CM

## 2021-01-19 HISTORY — DX: Personal history of COVID-19: Z86.16

## 2021-01-24 ENCOUNTER — Telehealth: Payer: Self-pay

## 2021-01-24 DIAGNOSIS — Z96652 Presence of left artificial knee joint: Secondary | ICD-10-CM | POA: Diagnosis not present

## 2021-01-24 DIAGNOSIS — M25562 Pain in left knee: Secondary | ICD-10-CM | POA: Diagnosis not present

## 2021-01-24 NOTE — Telephone Encounter (Signed)
[facility] Night - Client TELEPHONE ADVICE RECORD [facility] Patient Name: Erin Petersen Gender: Female DOB: 01/09/1941 Age: 80 Y 3 M 28 D Return Phone Number: [phone] (Primary) Address: City/ State/ Zip: [place]  [address] Client [facility] Night - Client Client Site [facility] Provider Tower, Roque Lias - MD Contact Type Call Who Is Calling Patient / Member / Family / Caregiver Call Type Triage / Clinical Relationship To Patient Self Return Phone Number [phone] (Primary) Chief Complaint Headache Reason for Call Symptomatic / Request for [place] states she tested positive for Covid on Wednesday. Caller states she is getting worse. Caller states she has headache, nausea,sore throat, congestion. Translation No Nurse Assessment Nurse: Rolin Barry, RN, [place] Dy Date/Time [name] Ghazi Time): 01/21/2021 12:16:13 PM Confirm and document reason for call. If symptomatic, describe symptoms. ---Caller states she tested positive for Covid on Wednesday. Caller states she is getting worse. Caller states she has headache, nausea,sore throat, congestion. No temp at this time, caller is taking tylenol. Caller has had 2 vaccines and a booster. Had Covid in feb 2022. Does the patient have any new or worsening symptoms? ---Yes Will a triage be completed? ---Yes Related visit to physician within the last 2 weeks? ---No Does the PT have any chronic conditions? (i.e. diabetes, asthma, this includes High risk factors for pregnancy, etc.) ---Yes List chronic conditions. ---HTN restless leg neuropathy Is this a behavioral health or substance abuse call? ---No Guidelines Guideline Title Affirmed Question Affirmed Notes Nurse Date/Time ([place] Time) COVID-19 - Diagnosed or Suspected MODERATE difficulty breathing (e.g., speaks in phrases, SOB even at rest, pulse  100-120) Deaton, RN, [place] Dy 01/21/2021 12:18:01 PM PLEASE NOTE: All timestamps contained within this report are represented as [place] Standard Time. CONFIDENTIALTY NOTICE: This fax transmission is intended only for the addressee. It contains information that is legally privileged, confidential or otherwise protected from use or disclosure. If you are not the intended recipient, you are strictly prohibited from reviewing, disclosing, copying using or disseminating any of this information or taking any action in reliance on or regarding this information. If you have received this fax in error, please notify us immediately by telephone so that we can arrange for its return to [place]. Phone: [phone], Toll-Free: [phone], Fax: [phone] Page: 2 of 2 Call Id: [id number] [place]. Time [name] Ghazi Time) Disposition Final User 01/21/2021 12:22:21 PM Go to ED Now Yes Deaton, RN, Cindee Lame Disagree/Comply Disagree Caller Understands Yes PreDisposition Did not know what to do Care Advice Given Per Guideline GO TO ED NOW: * You need to be seen in the Emergency Department. * Go to the ED at ___________ [place] now. Drive carefully. ANOTHER ADULT SHOULD DRIVE: * It is better and safer if another adult drives instead of you. CALL EMS 911 IF: * Severe difficulty breathing occurs * Lips or face turns blue * Confusion occurs. CARE ADVICE given per COVID-19 - DIAGNOSED OR SUSPECTED (Adult) guideline. REASSURANCE AND EDUCATION - GOING TO THE ED OR URGENT CARE CENTER DURING THE COVID-19 PANDEMIC: Comments User: Saverio Danker, RN Date/Time [name] Ghazi Time): 01/21/2021 12:23:33 PM Caller refused to be seen, advised that she wish that she would not have answered yes to my question, States that she was calling for medication. Advised that they do not call in meds after hours. Referrals GO TO FACILITY REFUS

## 2021-01-24 NOTE — Telephone Encounter (Signed)
I spoke with pt; pt was tested at [facility] and notified on 01/19/21 + covid test. Pt has not been seen since tested + for covid.Pts symptoms started on 01/18/21 with nausea and H/A pain level of 5,head congestion,scratchy S/T and slight SOB but no cough. Today pt feels perfectly fine except some sinus congestion. Home covid test today was negative. Pt said she is fine and does not need anything. Will send note to Dr Glori Bickers as Juluis Rainier. And will notify [place] by this note and teams.

## 2021-01-24 NOTE — Telephone Encounter (Signed)
Aware and agree with advisement  Update if symptoms change Erin Petersen

## 2021-01-31 ENCOUNTER — Telehealth: Payer: Self-pay

## 2021-01-31 NOTE — Chronic Care Management (AMB) (Signed)
Chronic Care Management Pharmacy Assistant   Name: VONETTA FOULK  MRN: [id number] DOB: 1940/08/22  Erin Petersen is an 80 y.o. year old female who presents for her initial CCM visit with the clinical pharmacist.  Reason for Encounter: Initial Questions   Conditions to be addressed/monitored: HTN   Recent office visits:  11/17/20-PCP-Marne Tower,MD-Patient presented for follow up hypertension and dizziness.Try reducing amlodipine to 2.52m once daily.discussed low glycemic diet and weight loss.Consider weaning amitriptyline.Follow up 3 months. 09/21/20-PCP-Marne Tower,MD-Patient presented for follow up hypertension-Disc trial to hold amlodipine 5 mg,instructed to get a new BP cuff. 09/09/20-Family Medicine-Jessica Cody,MD-Patient presented for a rash on her body.Urgent care diagnosed shingles. Trial of triamcinolone cream 0.1%.apply topically twice daily. 08/25/20-PCP-Marne Tower,MD-Patient presented for AWV.Discussed screenings and vaccines(pneumonia shot given)Reviewed previous labs(A1C 6.7) referral made for DM teaching. Check with GI for nausea.Increase D3 to 4000units daily,follow up 4 months  Recent consult visits:  12/16/20-Orthopedic Surgery-Michael Menz,MD-no data found 11/24/20-Cardiology-Kenneth Fath,MD-Patient presented for follow up hypertension.Recommend low sodium diet,no medication changes, follow up 3 months. 11/04/20-Podiatry-Kevin Patel DPM-Patient presented for left ingrown toenail,no medication changes 11/03/20-Dermatology-David Kowalski,MD-Patient presented for annual exam-- Recommend daily use of broad spectrum spf 30+ sunscreen to sun-exposed areas.  10/06/20-Orthopedic Surgery-Michael Menz,MD-Patient presented for H&P leading up to surgery. 09/05/20-[facility] [place]-David Tomczak,PA-Patient presented for shingles. Start valacyclovir 1g. Take 1 tablet 3 times daily for 7 days. 08/19/20-Podiatry-Kevin Patel,DPM-Patient presented for follow up ingrown toenail,no  medication changes  08/09/20-Internal Medicine-Saddat Khan,MD-Patient presented for follow up shortness of breath,discussed tests,discontinued zofran,bentyl. 08/03/20-Podiatry-Kevin Patel,DPM-Patient presented for ingrown toenail.Removal today,no medication changes.   Hospital visits:  10/21/20-[facility] ED- Patient presented for hypertensive episode after having knee replacement last week. Patient left on her own without being seen. 10/12/20 thru 10/14/20-[facility]-Patient presented for Left total knee replacement.Patient given perioperative antibiotics, lovenox 312mq 12 hours.She was given sequential compression devices, early ambulation, and Lovenox TEDs for DVT prophylaxis.  Medications: Outpatient Encounter Medications as of 01/31/2021  Medication Sig   albuterol (VENTOLIN HFA) 108 (90 Base) MCG/ACT inhaler Inhale 2 puffs into the lungs every 6 (six) hours as needed for wheezing or shortness of breath.   amitriptyline (ELAVIL) 25 MG tablet Take 1 tablet (25 mg total) by mouth at bedtime.   amLODipine (NORVASC) 2.5 MG tablet Take 1 tablet (2.5 mg total) by mouth daily.   aspirin 81 MG EC tablet Take 81 mg by mouth at bedtime.    Blood Glucose Monitoring Suppl (ONETOUCH VERIO) w/Device KIT To check glucose daily and prn for DM2   Cholecalciferol (VITAMIN D3) 50 MCG (2000 UT) TABS Take 2,000 Units by mouth daily.   Cyanocobalamin (VITAMIN B-12) 1000 MCG SUBL Take 2,000 mcg by mouth daily.   docusate sodium (COLACE) 100 MG capsule Take 1 capsule (100 mg total) by mouth 2 (two) times daily.   fluticasone (FLONASE) 50 MCG/ACT nasal spray Place 2 sprays into both nostrils daily. (Patient taking differently: Place 2 sprays into both nostrils at bedtime.)   glucose blood (ONETOUCH VERIO) test strip To check glucose daily and prn for DM2   hydrochlorothiazide (HYDRODIURIL) 25 MG tablet Take 1 tablet (25 mg total) by mouth daily.   HYDROcodone-acetaminophen (NORCO/VICODIN) 5-325 MG tablet Take 1 tablet by mouth  every 4 (four) hours as needed for moderate pain (pain score 4-6).   Lancets (ONETOUCH DELICA PLUS [id number] To check glucose daily and prn for DM2   LINZESS 145 MCG CAPS capsule TAKE 1 CAPSULE DAILY BEFORE BREAKFAST (Patient taking differently: Take 145  mcg by mouth daily before breakfast.)   MELATONIN PO Take 1 tablet by mouth at bedtime.   omeprazole (PRILOSEC) 40 MG capsule Take 40 mg by mouth in the morning.   ondansetron (ZOFRAN) 4 MG tablet Take 1 tablet (4 mg total) by mouth every 8 (eight) hours as needed for nausea or vomiting.   polyethylene glycol (MIRALAX / GLYCOLAX) 17 g packet Take 1 packet by mouth daily.    pregabalin (LYRICA) 150 MG capsule Take 1 capsule (150 mg total) by mouth 2 (two) times daily. (Patient taking differently: Take 150 mg by mouth See admin instructions. Take 150 mg by mouth in the morning and  300 mg in the afternoon)   rOPINIRole (REQUIP) 1 MG tablet TAKE 1 TABLET IN THE MORNING AND 1 TABLET IN THE AFTERNOON (Patient taking differently: Take 1 mg by mouth 2 (two) times daily with breakfast and lunch.)   rOPINIRole (REQUIP) 4 MG tablet Take 1 tablet (4 mg total) by mouth at bedtime.   simvastatin (ZOCOR) 20 MG tablet Take 1 tablet (20 mg total) by mouth at bedtime.   TURMERIC PO Take 1,000 mg by mouth daily.   No facility-administered encounter medications on file as of 01/31/2021.   Lab Results  Component Value Date/Time   HGBA1C 6.2 (A) 11/17/2020 09:50 AM   HGBA1C 6.7 (H) 08/10/2020 08:17 AM   HGBA1C 6.4 03/12/2020 10:47 AM     BP Readings from Last 3 Encounters:  11/17/20 (!) 148/80  10/21/20 (!) 182/83  10/14/20 (!) 149/60    Patient contacted to review initial questions prior to visit with Charlene Brooke.  Have you seen any other providers since your last visit with PCP? Yes Orthopedic surgery for follow up Left total knee replacement.  Any changes in your medications or health? No  Any side effects from any medications? No  Do  you have an symptoms or problems not managed by your medications? No  Any concerns about your health right now? Yes The patient reports that she will see Neurology later today and will discuss her Lyrica and Requip medication and her restless legs.  Has your provider asked that you check blood pressure, blood sugar, or follow special diet at home? Yes The patient does have a BP monitor.  Do you get any type of exercise on a regular basis? No The patient has had total knee replacement and has difficulty with any exercise at this time .  Can you think of a goal you would like to reach for your health? No  Do you have any problems getting your medications? No the patient uses [facility] per insurance but she has to call for refills as this service is not auto refill. They do send a 90DS .   Is there anything that you would like to discuss during the appointment? No   Spoke with patient and reminded them to have all medications, supplements and any blood glucose and blood pressure readings available for review with pharmacist, at their telephone visit on 02/04/21 at [facility]:  Medication:  Last Fill: Day Supply Simvastatin 28m 11/24/20 -    Care Gaps: Annual wellness visit in last year? Yes Most Recent BP reading: 148/80  72-P   LMarjo BickerCPP notified  VAvel Sensor CDesert View HighlandsAssistant [phone] Total time spent for month CPA: 50 min

## 2021-02-01 DIAGNOSIS — M542 Cervicalgia: Secondary | ICD-10-CM | POA: Diagnosis not present

## 2021-02-01 DIAGNOSIS — G4710 Hypersomnia, unspecified: Secondary | ICD-10-CM | POA: Diagnosis not present

## 2021-02-01 DIAGNOSIS — G629 Polyneuropathy, unspecified: Secondary | ICD-10-CM | POA: Diagnosis not present

## 2021-02-01 DIAGNOSIS — G2581 Restless legs syndrome: Secondary | ICD-10-CM | POA: Diagnosis not present

## 2021-02-03 NOTE — Progress Notes (Signed)
Chronic Care Management Pharmacy Note  02/09/2021 Name:  Erin Petersen MRN:  [id number] DOB:  Nov 03, 1940  Summary: -Pt reports trouble staying awake during daily activities (eating, socializing with friends); she is taking multiple sedating medications (amitriptyline 25 mg, pregabalin 450 mg/day, ropinirole 5.5 mg/day) and reports poor sleep at night. She is it taking amitriptyline for interstitial cystitis and is not sure it is helping. Her neurologist also just reduced ropinirole daytime dose from 1 mg to 0.75 mg, she has not started this yet. -Pt is taking omeprazole every other day; she has not tried H2RA before; she has osteopenia and continued PPI use risks worsening bone density  Recommendations/Changes made from today's visit: -Recommend trial of 1/2 dose amitriptyline (12.5 mg at bedtime); monitor for worsening urinary issues -Recommend trial off PPI; replace with famotidine 20 mg daily or PRN  Plan: -[name] will call patient 1 month to check on med changes -Pharmacist follow up televisit scheduled for 3 months    Subjective: Erin Petersen is an 80 y.o. year old female who is a primary patient of Tower, Wynelle Fanny, MD.  The CCM team was consulted for assistance with disease management and care coordination needs.    Engaged with patient by telephone for initial visit in response to provider referral for pharmacy case management and/or care coordination services.   Consent to Services:  The patient was given the following information about Chronic Care Management services today, agreed to services, and gave verbal consent: 1. CCM service includes personalized support from designated clinical staff supervised by the primary care provider, including individualized plan of care and coordination with other care providers 2. 24/7 contact phone numbers for assistance for urgent and routine care needs. 3. Service will only be billed when office clinical staff spend 20  minutes or more in a month to coordinate care. 4. Only one practitioner may furnish and bill the service in a calendar month. 5.The patient may stop CCM services at any time (effective at the end of the month) by phone call to the office staff. 6. The patient will be responsible for cost sharing (co-pay) of up to 20% of the service fee (after annual deductible is met). Patient agreed to services and consent obtained.  Patient Care Team: Tower, Wynelle Fanny, MD as PCP - General (Family Medicine) Charlton Haws, [facility] as Pharmacist (Pharmacist)  Patient lives at home alone, she is widowed.  Recent office visits: 11/17/20-PCP-Marne Tower,MD-Patient presented for follow up hypertension and dizziness.Try reducing amlodipine to 2.15m (5 mg caused hypotension).Consider weaning amitriptyline. D/C hydroxyzine and tramadol (ineffective/not taking). Follow up 3 months.  09/21/20-PCP-Marne Tower,MD-Patient presented for follow up hypertension-Disc trial to hold amlodipine 5 mg due to hypotension; instructed to get a new BP cuff.  09/09/20-Family Medicine-Jessica Cody,MD-Patient presented for a rash on her body.Urgent care diagnosed shingles. Trial of triamcinolone cream 0.1%.apply topically twice daily.  08/25/20-PCP-Marne Tower,MD-Patient presented for AWV.Discussed screenings and vaccines(pneumonia shot given)Reviewed previous labs(A1C 6.7) referral made for DM teaching. Check with GI for nausea.Increase D3 to 4000units daily,follow up 4 months   Recent consult visits: 02/01/21 Dr PLarkin Ina(neurology): f/u neuropathy; concern for polypharmacy, oversedation; continue Lyrica 150 mg AM, 300 mg HS; Reduce ropinirole 1 mg to 0.75 mg AM; 01/19/21 positive covid test. Sx resolved within 5 days. 12/16/20-Orthopedic Surgery-Michael Menz,MD-no data found 11/24/20-Cardiology-Kenneth Fath,MD-Patient presented for follow up hypertension. Increase amlodipine to 5 mg daily.  11/04/20-Podiatry-Kevin Patel DPM-Patient presented for  left ingrown toenail,no medication changes 11/03/20-Dermatology-David Kowalski,MD-Patient presented  for annual exam-- Recommend daily use of broad spectrum spf 30+ sunscreen to sun-exposed areas.  10/06/20-Orthopedic Surgery-Michael Menz,MD-Patient presented for H&P leading up to surgery. 09/05/20-[facility]-David Tomczak,PA-Patient presented for shingles. Start valacyclovir 1g. Take 1 tablet 3 times daily for 7 days. 08/19/20-Podiatry-Kevin Patel,DPM-Patient presented for follow up ingrown toenail,no medication changes  08/09/20-Internal Medicine-Saddat Khan,MD-Patient presented for follow up shortness of breath,discussed tests,discontinued zofran,bentyl. 08/03/20-Podiatry-Kevin Patel,DPM-Patient presented for ingrown toenail.Removal today,no medication changes.   Hospital visits: 10/21/20-[facility] ED- Patient presented for hypertensive episode after having knee replacement last week. Patient left on her own without being seen.  10/12/20 thru 10/14/20-[facility]-Patient presented for Left total knee replacement.Patient given perioperative antibiotics, lovenox 21m q 12 hours.She was given sequential compression devices, early ambulation, and Lovenox TEDs for DVT prophylaxis.   Objective:  Lab Results  Component Value Date   CREATININE 0.71 10/21/2020   BUN 14 10/21/2020   GFR 45.99 (L) 08/10/2020   GFRNONAA >60 10/21/2020   GFRAA >60 06/07/2019   NA 130 (L) 10/21/2020   K 3.6 10/21/2020   CALCIUM 9.3 10/21/2020   CO2 25 10/21/2020   GLUCOSE 112 (H) 10/21/2020    Lab Results  Component Value Date/Time   HGBA1C 6.2 (A) 11/17/2020 09:50 AM   HGBA1C 6.7 (H) 08/10/2020 08:17 AM   HGBA1C 6.4 03/12/2020 10:47 AM   GFR 45.99 (L) 08/10/2020 08:17 AM   GFR 80.10 02/13/2020 11:55 AM    Last diabetic Eye exam:  Lab Results  Component Value Date/Time   HMDIABEYEEXA No Retinopathy 11/04/2020 12:00 AM    Last diabetic Foot exam: No results found for: HMDIABFOOTEX   Lab Results  Component Value  Date   CHOL 163 08/10/2020   HDL 36.20 (L) 08/10/2020   LDLCALC 67 03/12/2020   LDLDIRECT 90.0 08/10/2020   TRIG 368.0 (H) 08/10/2020   CHOLHDL 5 08/10/2020    Hepatic Function Latest Ref Rng & Units 10/01/2020 08/10/2020 02/13/2020  Total Protein 6.5 - 8.1 g/dL 7.2 6.8 7.4  Albumin 3.5 - 5.0 g/dL 4.4 4.4 3.9  AST 15 - 41 U/L _0 ALT 0 - 44 U/L _1 Alk Phosphatase 38 - 126 U/L 67 73 120(H)  Total Bilirubin 0.3 - 1.2 mg/dL 0.4 0.2 0.2    Lab Results  Component Value Date/Time   TSH 1.51 08/10/2020 08:17 AM    CBC Latest Ref Rng & Units 10/21/2020 10/13/2020 10/12/2020  WBC 4.0 - 10.5 K/uL 10.4 10.7(H) 9.4  Hemoglobin 12.0 - 15.0 g/dL 11.8(L) 10.4(L) 11.4(L)  Hematocrit 36.0 - 46.0 % 35.1(L) 31.6(L) 35.0(L)  Platelets 150 - 400 K/uL 567(H) 261 269   Iron/TIBC/Ferritin/ %Sat    Component Value Date/Time   IRON 58 10/21/2019 1351   TIBC 346 10/21/2019 1351   FERRITIN 66.9 02/13/2020 1155   FERRITIN 24 10/21/2019 1351   IRONPCTSAT 17 10/21/2019 1351    Lab Results  Component Value Date/Time   VD25OH 31.20 08/10/2020 08:17 AM   VD25OH 33.46 04/21/2019 11:27 AM    Clinical ASCVD: No  The 10-year ASCVD risk score (Arnett DK, et al., 2019) is: 53.5%   Values used to calculate the score:     Age: [age]years     Sex: Female     Is Non-Hispanic African American: No     Diabetic: Yes     Tobacco smoker: No     Systolic Blood Pressure: 1409mmHg     Is BP treated: Yes     HDL Cholesterol:  36.2 mg/dL     Total Cholesterol: 163 mg/dL    Depression screen [facility] 2/9 09/10/2020 08/25/2020 04/08/2020  Decreased Interest 0 0 0  Down, Depressed, Hopeless 0 0 0  PHQ - 2 Score 0 0 0  Altered sleeping - - -  Tired, decreased energy - - -  Change in appetite - - -  Feeling bad or failure about yourself  - - -  Trouble concentrating - - -  Moving slowly or fidgety/restless - - -  Suicidal thoughts - - -  PHQ-9 Score - - -  Difficult doing work/chores - - -     Social History    Tobacco Use  Smoking Status Never  Smokeless Tobacco Never   BP Readings from Last 3 Encounters:  02/01/21 136/84  11/17/20 (!) 148/80  10/21/20 (!) 182/83   Pulse Readings from Last 3 Encounters:  02/01/21 76  11/17/20 72  10/21/20 77   Wt Readings from Last 3 Encounters:  11/17/20 163 lb 6 oz (74.1 kg)  10/21/20 200 lb 9.9 oz (91 kg)  10/13/20 200 lb 9.9 oz (91 kg)   BMI Readings from Last 3 Encounters:  11/17/20 33.85 kg/m  10/21/20 40.52 kg/m  10/13/20 40.88 kg/m    Assessment/Interventions: Review of patient past medical history, allergies, medications, health status, including review of consultants reports, laboratory and other test data, was performed as part of comprehensive evaluation and provision of chronic care management services.   SDOH:  (Social Determinants of Health) assessments and interventions performed: Yes SDOH Interventions    Flowsheet Row Most Recent Value  SDOH Interventions   Financial Strain Interventions Intervention Not Indicated      SDOH Screenings   Alcohol Screen: Not on file  Depression (PHQ2-9): Low Risk    PHQ-2 Score: 0  Financial Resource Strain: Low Risk    Difficulty of Paying Living Expenses: Not very hard  Food Insecurity: Not on file  Housing: Not on file  Physical Activity: Not on file  Social Connections: Not on file  Stress: Not on file  Tobacco Use: Low Risk    Smoking Tobacco Use: Never   Smokeless Tobacco Use: Never   Passive Exposure: Not on file  Transportation Needs: Not on file    [place]  Allergies  Allergen Reactions   Bee Venom Anaphylaxis and Hives   Fosamax [Alendronate] Nausea Only   Ciprofloxacin Hcl Rash    Mild rash after long term use   Sulfa Antibiotics Rash    Bactrim    Medications Reviewed Today     Reviewed by Charlton Haws, [facility] (Pharmacist) on 02/04/21 at 1308  Med List Status: <None>   Medication Order Taking? Sig Documenting Provider Last Dose Status  Informant  acetaminophen (TYLENOL) 650 MG CR tablet [id number] Yes Take 1,300 mg by mouth in the morning and at bedtime. [provider] Taking Active   albuterol (VENTOLIN HFA) 108 (90 Base) MCG/ACT inhaler [id number] Yes Inhale 2 puffs into the lungs every 6 (six) hours as needed for wheezing or shortness of breath. [provider] Taking Active Self  amitriptyline (ELAVIL) 25 MG tablet [id number] Yes Take 1 tablet (25 mg total) by mouth at bedtime. Tower, Wynelle Fanny, MD Taking Active Self  amLODipine (NORVASC) 2.5 MG tablet [id number] Yes Take 1 tablet (2.5 mg total) by mouth daily.  Patient taking differently: Take 5 mg by mouth daily.   Tower, Wynelle Fanny, MD Taking Active   aspirin 81 MG EC tablet [id number]  Yes Take 81 mg by mouth at bedtime.  [provider] Taking Active Self  cephALEXin (KEFLEX) 500 MG capsule [id number] Yes Take 500 mg by mouth. PRN dentist appts for 1 year following knee replacement [provider] Taking Active   Cholecalciferol (VITAMIN D3) 50 MCG (2000 UT) TABS [id number] Yes Take 5,000 Units by mouth daily. [provider] Taking Active Self  Cyanocobalamin (VITAMIN B-12) 1000 MCG SUBL [id number] Yes Take 2,000 mcg by mouth daily. [provider] Taking Active Self  docusate sodium (COLACE) 100 MG capsule [id number] Yes Take 1 capsule (100 mg total) by mouth 2 (two) times daily. Duanne Guess, PA-C Taking Active Self  EPINEPHrine 0.3 mg/0.3 mL IJ SOAJ injection [id number] Yes Inject 0.3 mg into the muscle as needed for anaphylaxis. [provider] Taking Active   fluticasone (FLONASE) 50 MCG/ACT nasal spray [id number] Yes Place 2 sprays into both nostrils daily.  Patient taking differently: Place 2 sprays into both nostrils at bedtime.   Tower, Wynelle Fanny, MD Taking Active Self  hydrochlorothiazide (HYDRODIURIL) 25 MG tablet [id number] Yes Take 1 tablet (25 mg total) by mouth daily. Tower, Wynelle Fanny, MD Taking Active Self   MELATONIN PO [id number] Yes Take 1 tablet by mouth at bedtime. [provider] Taking Active Self  meloxicam (MOBIC) 15 MG tablet [id number] Yes Take 15 mg by mouth daily. [provider] Taking Active   omeprazole (PRILOSEC) 40 MG capsule [id number] Yes Take 40 mg by mouth in the morning. [provider] Taking Active Self  ondansetron (ZOFRAN) 4 MG tablet [id number] Yes Take 1 tablet (4 mg total) by mouth every 8 (eight) hours as needed for nausea or vomiting. Tower, Wynelle Fanny, MD Taking Active Self  polyethylene glycol (MIRALAX / GLYCOLAX) 17 g packet [id number]  Take 1 packet by mouth daily.  [provider]  Active Self  pregabalin (LYRICA) 150 MG capsule [id number] Yes Take 1 capsule (150 mg total) by mouth 2 (two) times daily.  Patient taking differently: Take 150 mg by mouth See admin instructions. Take 150 mg by mouth in the morning and  300 mg in the afternoon   Elby Beck, FNP Taking Active Self  rOPINIRole (REQUIP) 0.25 MG tablet [id number] Yes Take 0.75 mg by mouth in the morning and at bedtime. [provider] Taking Active   rOPINIRole (REQUIP) 4 MG tablet [id number] Yes Take 1 tablet (4 mg total) by mouth at bedtime. Tower, Wynelle Fanny, MD Taking Active Self  simvastatin (ZOCOR) 20 MG tablet [id number] Yes Take 1 tablet (20 mg total) by mouth at bedtime. Tower, Wynelle Fanny, MD Taking Active Self  TURMERIC PO [id number] Yes Take 1,000 mg by mouth 2 (two) times daily. [provider] Taking Active Self            Patient Active Problem List   Diagnosis Date Noted   Hyperlipidemia 11/17/2020   S/P TKR (total knee replacement) using cement, left 10/12/2020   Somnolence, daytime 09/09/2020   Rash 09/09/2020   Medicare annual wellness visit, subsequent 08/25/2020   Encounter for screening mammogram for breast cancer 08/25/2020   Encounter for general adult medical examination with abnormal findings 08/25/2020   GERD (gastroesophageal  reflux disease) 08/25/2020   Dizzy spells 08/25/2020   Migraine 03/12/2020   IBS (irritable bowel syndrome) 03/12/2020   S/P TKR (total knee replacement) using cement, right 01/01/2020   History of vaginal hysterectomy 06/11/2019   Estrogen deficiency 04/22/2019   History of skin cancer 04/22/2019  High-tone pelvic floor dysfunction 04/03/2019   IC (interstitial cystitis) 04/03/2019   Neuralgia of both pudendal nerves 04/03/2019   Pelvic pain in female 04/03/2019   Osteoporosis 02/12/2019   Asthma 01/31/2019   Adverse reaction to influenza vaccine, initial encounter 12/03/2018   Vitamin D deficiency 12/03/2018   Restless legs syndrome 11/28/2018   Small fiber neuropathy 11/28/2018   Allergic rhinitis 10/08/2018   Benign essential hypertension 10/08/2018   Chronic bladder pain 10/08/2018   Neuropathy 10/08/2018   Osteoarthritis 10/08/2018   S/P BSO (bilateral salpingo-oophorectomy) 01/07/2018   OSA on CPAP 12/29/2016   Other and unspecified disc disorder of lumbar region 10/25/2000   Prediabetes 09/24/1998   Tinnitus of both ears 09/24/1983    Immunization History  Administered Date(s) Administered   Fluad Quad(high Dose 65+) 12/03/2018   Influenza Split 02/22/2001   Influenza,inj,Quad PF,6+ Mos 03/12/2020, 11/17/2020   Influenza-Unspecified 11/27/2017   PFIZER(Purple Top)SARS-COV-2 Vaccination 03/06/2019, 03/29/2019, 10/31/2019   PNEUMOCOCCAL CONJUGATE-20 08/25/2020   PPD Test 02/27/2014   Pneumococcal Polysaccharide-23 10/26/1999   Td 11/01/2001   Tdap 11/01/2001    Conditions to be addressed/monitored:  Hypertension, Hyperlipidemia, GERD, Osteoporosis, and Osteoarthritis, Interstitial cystitis  Care Plan : [place]  Updates made by Charlton Haws, [place] since 02/09/2021 12:00 AM     Problem: Hypertension, Hyperlipidemia, GERD, Osteoporosis, and Osteoarthritis, Interstitial cystitis   Priority: High     Long-Range Goal: Disease mgmt   Start  Date: 02/09/2021  Expected End Date: 02/09/2022  This Visit's Progress: On track  Priority: High  Note:   Current Barriers:  Unable to independently monitor therapeutic efficacy  Pharmacist Clinical Goal(s):  Patient will achieve adherence to monitoring guidelines and medication adherence to achieve therapeutic efficacy through collaboration with PharmD and provider.   Interventions: 1:1 collaboration with Tower, Wynelle Fanny, MD regarding development and update of comprehensive plan of care as evidenced by provider attestation and co-signature Inter-disciplinary care team collaboration (see longitudinal plan of care) Comprehensive medication review performed; medication list updated in electronic medical record  Hypertension (BP goal <140/90) -Controlled - pt endorses compliance with medication as below; she reports checking BP periodically and it has been at goal -Hx OSA; on CPAP, falls asleep during day often -Current home readings: 12/6 - 136/84, 76 -Current treatment: Amlodipine 5 mg daily HCTZ 25 mg daily -Denies hypotensive/hypertensive symptoms -Educated on BP goals and benefits of medications for prevention of heart attack, stroke and kidney damage; Daily salt intake goal < 2300 mg; Importance of home blood pressure monitoring; -Counseled to monitor BP at home periodically -Recommended to continue current medication  Hyperlipidemia: (LDL goal < 100) -Controlled - LDL is at goal but did worsen from 67 to 90 between Jan and June 2022; pt endorses compliance with statin; she endorses dietary indiscretions (sweets) and limited movement/exercise due to knee pain  -Hx TIA, resolved w/in 2 hours -Current treatment: Simvastatin 20 mg daily HS Aspirin 81 mg daily HS -Educated on Cholesterol goals; Benefits of statin for ASCVD risk reduction; Importance of limiting foods high in cholesterol; Exercise goal of 150 minutes per week; -Recommended to continue current medication  Asthma  (Goal: control symptoms and prevent exacerbations) -Controlled - uses albuterol daily before AM walk;  -Current treatment  Albuterol HFA prn -Pulmonary function testing: not on file -Recommended to continue current medication  Osteopenia (Goal prevent fractures) -Not ideally controlled - pt was unable to tolerate alendronate and Prolia was too expensive in 2021; -Last DEXA Scan: 01/21/19   T-Score  femoral neck: -1.7  T-Score forearm radius: -1.9  10-year probability of major osteoporotic fracture: 30.2%  10-year probability of hip fracture: 16.3% -Patient is a candidate for pharmacologic treatment due to T-Score -1.0 to -2.5 and 10-year risk of major osteoporotic fracture > 20% and T-Score -1.0 to -2.5 and 10-year risk of hip fracture > 3% -Current treatment  Vitamin D 5000 IU daily -Medications previously tried: alendronate (2021-2022, nausea); Prolia was not covered in 2021, early 2022 was checking on coverage again, unclear results -Recommend 1200 mg of calcium daily from dietary and supplemental sources. Recommend weight-bearing and muscle strengthening exercises for building and maintaining bone density. -Consider re-checking Prolia benefits Jan 2023  Chronic pain (Goal: manage pain) -Controlled - pt reports she has fibromyalgia; current regimen works reasonably well but pt endorses falling asleep during various activities during the day (eating, playing cards, at church), she thinks this is due to poor sleep at night and not due to her medications -Hx osteoarthritis (s/p TKR 09/2020), lumbar disc disorder, neuropathy -Current treatment  Pregabalin 150 mg - 1 tab AM, 2 tab PM Amitriptyline 25 mg HS (interstitial cystitis) Meloxicam 15 mg daily Turmeric 1000 mg BID -Counseled on additive sedative effects of pregabalin, amitriptyline and ropinirole - these are likely contributing to sedation in the daytime -Advised trial of amitriptyline 1/2 dose (12.5 mg) daily  RLS (Goal: manage  symptoms) -Controlled - pt is concerned about upcoming dose reduction in ropinirole to 0.75 mg during the day; she has not picked up new rx yet -Current treatment  Ropinirole 0.75 mg - AM, lunch (not started yet - still has 1 mg) Ropinirole 4 mg - HS -Discussed role of ropinirole in oversedation (see above); advised lower dose of ropinirole may not change efficacy so much and may improve daytime sedation -Recommended to continue current medication  GERD (Goal: manage symptoms) -Controlled - pt reports reflux symptoms worsen when she goes >2 days without omeprazole; she has not tried H2RAs -Current treatment  Omeprazole 40 mg daily - taking every other day -Medications previously tried: Dexilant  -Counseled on long term PPI side effects including reduced bone density;  -Recommended trial off PPI and trial of famotidine 20 mg daily instead  Constipation (Goal: manage symptoms) -Controlled - pt is planning to stop Linzess once Rx runs out due to high cost and not working that well anyway; she is not currently taking docusate but titrates Miralax to effect -Current treatment  Docusate 100 mg - not taking Linzess 145 mcg daily Miralax  -Advised to resume docusate when stopping Linzess -Recommended to continue current medication  Interstitial cystitis (Goal: manage symptoms) -Controlled - pt has tried time off amitriptyline and sleep was negatively effected; she is not sure if it is helping or not with cystitis -Current treatment  Amitriptyline 25 mg daily HS  -Medications previously tried: Elmiron, hydroxyzine -Advised trial 1/2 dose amitriptyline given potential polypharmacy/oversedation, as above  Poor sleep (Goal: improve sleep) -Not ideally controlled -gets up 4x per night to urinate; wears CPAP; sometimes she falls asleep doing tasks during the day (eating a hamburger, at church, playing cards with friends) -Current treatment  Melatonin + calcium + L-tryptophan  (Sleep-3)] Amitriptyline 25 mg HS -Discussed falling asleep during daily tasks is likely due to polypharmacy/multiple sedating meds along with poor sleep at night -Trial 1/2 dose amitriptyline as above  Health Maintenance -Vaccine gaps: Shingrix (Cannot tolerate HD flu shot) -Current therapy:  Vitamin B12 1000 mcg Fluticasone nasal spray- sinus headaches Ondansetron 4 mg PRN -  rare use -Patient is satisfied with current therapy and denies issues -Recommended to continue current medication  Patient Goals/Self-Care Activities Patient will:  - take medications as prescribed as evidenced by patient report and record review focus on medication adherence by pill box -Try amitriptyline 1/2 dose at bedtime -Trial off omeprazole. Try famotidine 20 mg daily or as needed instead      Medication Assistance: None required.  Patient affirms current coverage meets needs.  Compliance/Adherence/Medication fill history: Care Gaps: Urine microalbumin Hep C screening  Star-Rating Drugs: Simvastatin - LF 11/04/20 x 90 ds; Orlovista 100%  Patient's preferred pharmacy is:  [id number]Phone: [phone]: [phone] [facility] [place] [address]Phone: [phone]: [phone] Uses pill box? Yes Pt endorses 100% compliance  We discussed: Current pharmacy is preferred with insurance plan and patient is satisfied with pharmacy services Patient decided to: Continue current medication management strategy  Care Plan and Follow Up Patient Decision:  Patient agrees to Care Plan and Follow-up.  Plan: Telephone follow up appointment with care management team member scheduled for:  3 months  LCharlene Brooke PharmD, BCACP Clinical Pharmacist LHyrumPrimary Care at [phone]

## 2021-02-04 ENCOUNTER — Other Ambulatory Visit: Payer: Self-pay

## 2021-02-04 ENCOUNTER — Ambulatory Visit: Payer: Medicare Other | Admitting: Pharmacist

## 2021-02-04 VITALS — BP 136/84 | HR 76

## 2021-02-04 DIAGNOSIS — E7800 Pure hypercholesterolemia, unspecified: Secondary | ICD-10-CM

## 2021-02-04 DIAGNOSIS — G629 Polyneuropathy, unspecified: Secondary | ICD-10-CM

## 2021-02-04 DIAGNOSIS — I10 Essential (primary) hypertension: Secondary | ICD-10-CM

## 2021-02-04 DIAGNOSIS — G2581 Restless legs syndrome: Secondary | ICD-10-CM

## 2021-02-04 DIAGNOSIS — R400 Somnolence: Secondary | ICD-10-CM

## 2021-02-04 DIAGNOSIS — M810 Age-related osteoporosis without current pathological fracture: Secondary | ICD-10-CM

## 2021-02-04 DIAGNOSIS — K219 Gastro-esophageal reflux disease without esophagitis: Secondary | ICD-10-CM

## 2021-02-04 DIAGNOSIS — N3010 Interstitial cystitis (chronic) without hematuria: Secondary | ICD-10-CM

## 2021-02-04 DIAGNOSIS — J4520 Mild intermittent asthma, uncomplicated: Secondary | ICD-10-CM

## 2021-02-07 ENCOUNTER — Ambulatory Visit: Payer: Medicare Other | Admitting: Family Medicine

## 2021-02-09 NOTE — Patient Instructions (Signed)
Visit Information  Phone number for Pharmacist: [phone]  Thank you for meeting with me to discuss your medications! I look forward to working with you to achieve your health care goals. Below is a summary of what we talked about during the visit:   Goals Addressed             This Visit's Progress    Manage My Medicine       Timeframe:  Long-Range Goal Priority:  Medium Start Date:       02/09/21                      Expected End Date:    02/09/22                   Follow Up Date March 2023   - call for medicine refill 2 or 3 days before it runs out - call if I am sick and can't take my medicine - keep a list of all the medicines I take; vitamins and herbals too - use a pillbox to sort medicine  -Try amitriptyline 1/2 dose at bedtime -Trial off omeprazole. Try famotidine 20 mg daily or as needed instead   Why is this important?   These steps will help you keep on track with your medicines.   Notes:         Care Plan : [place]  Updates made by Charlton Haws, RPH since 02/09/2021 12:00 AM     Problem: Hypertension, Hyperlipidemia, GERD, Osteoporosis, and Osteoarthritis, Interstitial cystitis   Priority: High     Long-Range Goal: Disease mgmt   Start Date: 02/09/2021  Expected End Date: 02/09/2022  This Visit's Progress: On track  Priority: High  Note:   Current Barriers:  Unable to independently monitor therapeutic efficacy  Pharmacist Clinical Goal(s):  Patient will achieve adherence to monitoring guidelines and medication adherence to achieve therapeutic efficacy through collaboration with PharmD and provider.   Interventions: 1:1 collaboration with Tower, Wynelle Fanny, MD regarding development and update of comprehensive plan of care as evidenced by provider attestation and co-signature Inter-disciplinary care team collaboration (see longitudinal plan of care) Comprehensive medication review performed; medication list updated in  electronic medical record  Hypertension (BP goal <140/90) -Controlled - pt endorses compliance with medication as below; she reports checking BP periodically and it has been at goal -Hx OSA; on CPAP, falls asleep during day often -Current home readings: 12/6 - 136/84, 76 -Current treatment: Amlodipine 5 mg daily HCTZ 25 mg daily -Denies hypotensive/hypertensive symptoms -Educated on BP goals and benefits of medications for prevention of heart attack, stroke and kidney damage; Daily salt intake goal < 2300 mg; Importance of home blood pressure monitoring; -Counseled to monitor BP at home periodically -Recommended to continue current medication  Hyperlipidemia: (LDL goal < 100) -Controlled - LDL is at goal but did worsen from 67 to 90 between Jan and June 2022; pt endorses compliance with statin; she endorses dietary indiscretions (sweets) and limited movement/exercise due to knee pain  -Hx TIA, resolved w/in 2 hours -Current treatment: Simvastatin 20 mg daily HS Aspirin 81 mg daily HS -Educated on Cholesterol goals; Benefits of statin for ASCVD risk reduction; Importance of limiting foods high in cholesterol; Exercise goal of 150 minutes per week; -Recommended to continue current medication  Asthma (Goal: control symptoms and prevent exacerbations) -Controlled - uses albuterol daily before AM walk;  -Current treatment  Albuterol HFA prn -Pulmonary function testing: not on  file -Recommended to continue current medication  Osteopenia (Goal prevent fractures) -Not ideally controlled - pt was unable to tolerate alendronate and Prolia was too expensive in 2021; -Last DEXA Scan: 01/21/19   T-Score femoral neck: -1.7  T-Score forearm radius: -1.9  10-year probability of major osteoporotic fracture: 30.2%  10-year probability of hip fracture: 16.3% -Patient is a candidate for pharmacologic treatment due to T-Score -1.0 to -2.5 and 10-year risk of major osteoporotic fracture > 20% and  T-Score -1.0 to -2.5 and 10-year risk of hip fracture > 3% -Current treatment  Vitamin D 5000 IU daily -Medications previously tried: alendronate (2021-2022, nausea); Prolia was not covered in 2021, early 2022 was checking on coverage again, unclear results -Recommend 1200 mg of calcium daily from dietary and supplemental sources. Recommend weight-bearing and muscle strengthening exercises for building and maintaining bone density. -Consider re-checking Prolia benefits Jan 2023  Chronic pain (Goal: manage pain) -Controlled - pt reports she has fibromyalgia; current regimen works reasonably well but pt endorses falling asleep during various activities during the day (eating, playing cards, at church), she thinks this is due to poor sleep at night and not due to her medications -Hx osteoarthritis (s/p TKR 09/2020), lumbar disc disorder, neuropathy -Current treatment  Pregabalin 150 mg - 1 tab AM, 2 tab PM Amitriptyline 25 mg HS (interstitial cystitis) Meloxicam 15 mg daily Turmeric 1000 mg BID -Counseled on additive sedative effects of pregabalin, amitriptyline and ropinirole - these are likely contributing to sedation in the daytime -Advised trial of amitriptyline 1/2 dose (12.5 mg) daily  RLS (Goal: manage symptoms) -Controlled - pt is concerned about upcoming dose reduction in ropinirole to 0.75 mg during the day; she has not picked up new rx yet -Current treatment  Ropinirole 0.75 mg - AM, lunch (not started yet - still has 1 mg) Ropinirole 4 mg - HS -Discussed role of ropinirole in oversedation (see above); advised lower dose of ropinirole may not change efficacy so much and may improve daytime sedation -Recommended to continue current medication  GERD (Goal: manage symptoms) -Controlled - pt reports reflux symptoms worsen when she goes >2 days without omeprazole; she has not tried H2RAs -Current treatment  Omeprazole 40 mg daily - taking every other day -Medications previously tried:  Dexilant  -Counseled on long term PPI side effects including reduced bone density;  -Recommended trial off PPI and trial of famotidine 20 mg daily instead  Constipation (Goal: manage symptoms) -Controlled - pt is planning to stop Linzess once Rx runs out due to high cost and not working that well anyway; she is not currently taking docusate but titrates Miralax to effect -Current treatment  Docusate 100 mg - not taking Linzess 145 mcg daily Miralax  -Advised to resume docusate when stopping Linzess -Recommended to continue current medication  Interstitial cystitis (Goal: manage symptoms) -Controlled - pt has tried time off amitriptyline and sleep was negatively effected; she is not sure if it is helping or not with cystitis -Current treatment  Amitriptyline 25 mg daily HS  -Medications previously tried: Elmiron, hydroxyzine -Advised trial 1/2 dose amitriptyline given potential polypharmacy/oversedation, as above  Poor sleep (Goal: improve sleep) -Not ideally controlled -gets up 4x per night to urinate; wears CPAP; sometimes she falls asleep doing tasks during the day (eating a hamburger, at church, playing cards with friends) -Current treatment  Melatonin + calcium + L-tryptophan (Sleep-3)] Amitriptyline 25 mg HS -Discussed falling asleep during daily tasks is likely due to polypharmacy/multiple sedating meds along with poor sleep  at night -Trial 1/2 dose amitriptyline as above  Health Maintenance -Vaccine gaps: Shingrix (Cannot tolerate HD flu shot) -Current therapy:  Vitamin B12 1000 mcg Fluticasone nasal spray- sinus headaches Ondansetron 4 mg PRN - rare use -Patient is satisfied with current therapy and denies issues -Recommended to continue current medication  Patient Goals/Self-Care Activities Patient will:  - take medications as prescribed as evidenced by patient report and record review focus on medication adherence by pill box -Try amitriptyline 1/2 dose at  bedtime -Trial off omeprazole. Try famotidine 20 mg daily or as needed instead      Erin Petersen was given information about Chronic Care Management services today including:  CCM service includes personalized support from designated clinical staff supervised by her physician, including individualized plan of care and coordination with other care providers 24/7 contact phone numbers for assistance for urgent and routine care needs. Standard insurance, coinsurance, copays and deductibles apply for chronic care management only during months in which we provide at least 20 minutes of these services. Most insurances cover these services at 100%, however patients may be responsible for any copay, coinsurance and/or deductible if applicable. This service may help you avoid the need for more expensive face-to-face services. Only one practitioner may furnish and bill the service in a calendar month. The patient may stop CCM services at any time (effective at the end of the month) by phone call to the office staff.  Patient agreed to services and verbal consent obtained.   Patient verbalizes understanding of instructions provided today and agrees to view in [place].  Telephone follow up appointment with pharmacy team member scheduled for: 3 months  Charlene Brooke, PharmD, [facility] Clinical Pharmacist [facility] [phone]

## 2021-02-10 ENCOUNTER — Ambulatory Visit: Payer: Medicare Other | Admitting: Internal Medicine

## 2021-02-11 ENCOUNTER — Ambulatory Visit: Payer: Medicare Other | Admitting: Physician Assistant

## 2021-02-14 IMAGING — MR MR HEAD WO/W CM
14 series · 47 of 48 positions shown · IV contrast (7ml Gadavist)
Comparison: None.

CLINICAL DATA: Six year history of memory loss and speech
disturbance. Right arm pain. Numbness and burning of the right foot.

EXAM:
MRI HEAD WITHOUT AND WITH CONTRAST
TECHNIQUE: Multiplanar, multiecho pulse sequences of the brain and surrounding
structures were obtained without and with intravenous contrast.
CONTRAST:  7mL GADAVIST GADOBUTROL 1 MMOL/ML IV SOLN

[Series 5: ax dwi_tracew · axial · 3.0mm · 0.60mm/px · z∈[-87,+68]mm · 4 of 48 slices shown]
[im 1/48]
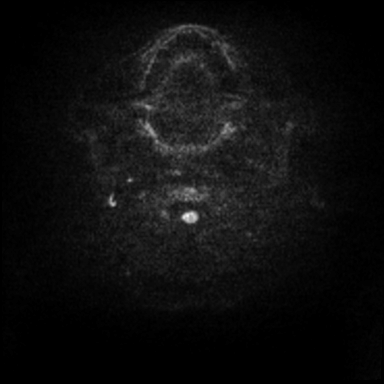
[im 16/48]
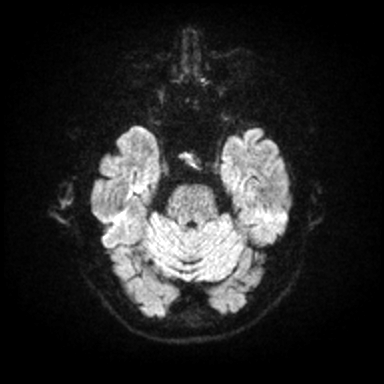
[im 32/48]
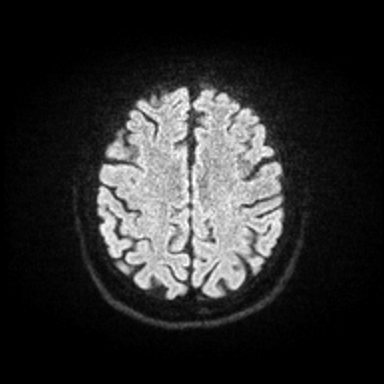
[im 48/48]
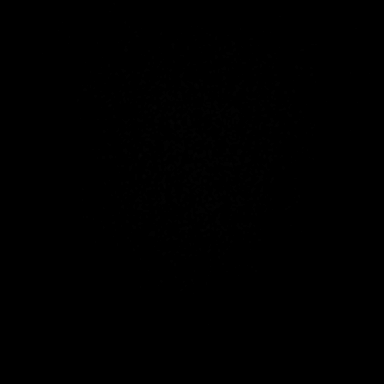

[Series 6: ax dwi_adc · axial · 3.0mm · 0.60mm/px · z∈[-87,+61]mm · 4 of 46 slices shown]
[im 1/46]
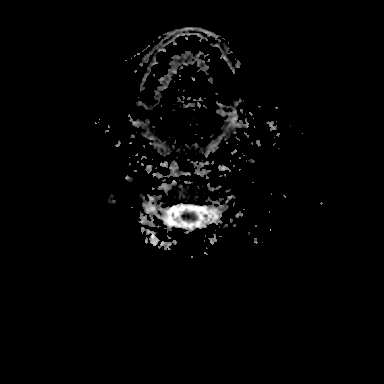
[im 16/46]
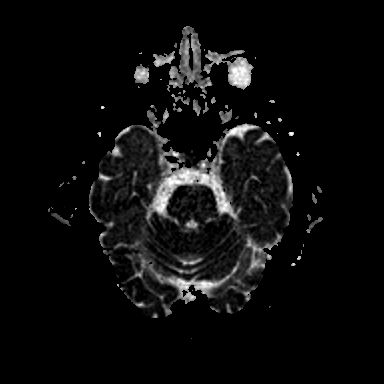
[im 31/46]
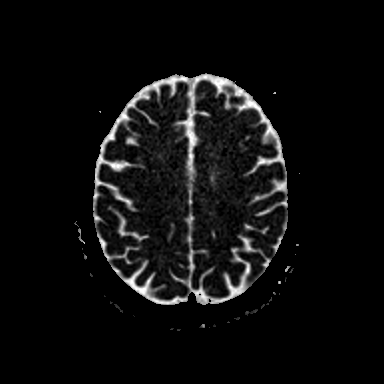
[im 46/46]
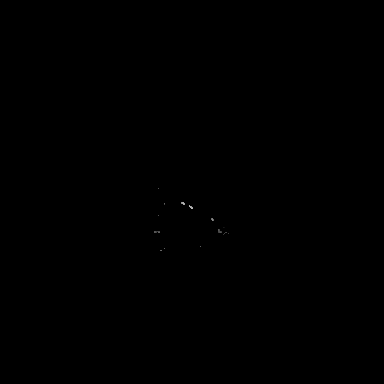

[Series 7: cor dwi_tracew · coronal · 5.0mm · 0.60mm/px · 2 of 40 slices shown]
[im 1/40]
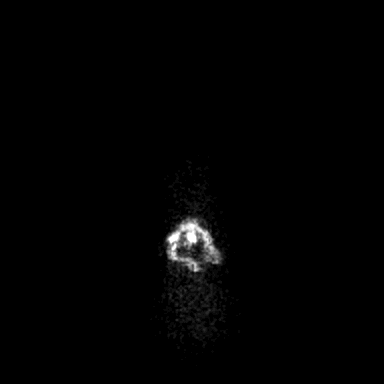
[im 40/40]
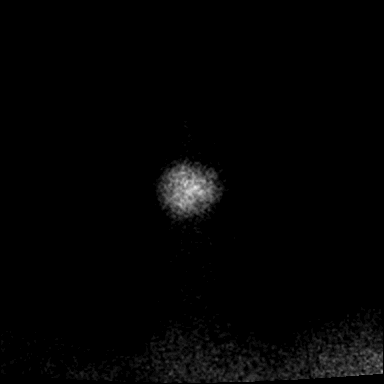

[Series 8: cor dwi_adc · coronal · 5.0mm · 0.60mm/px · 2 of 37 slices shown]
[im 1/37]
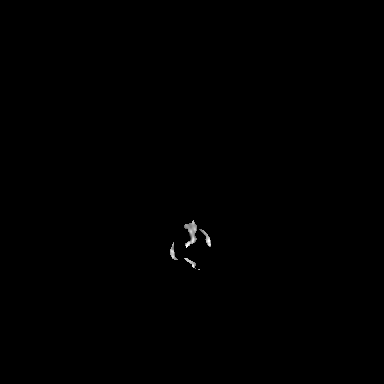
[im 37/37]
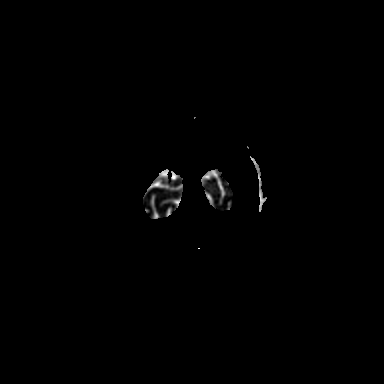

[Series 9: T1 · sagittal · 5.0mm · 0.62mm/px · 1 of 23 slices shown (1 of 2)]
[im 1/23]
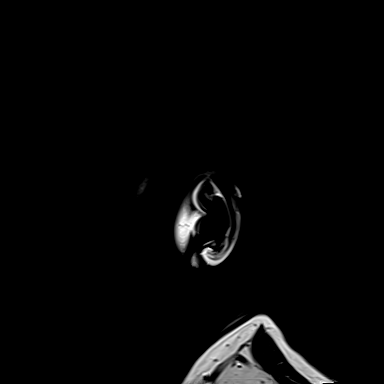

[Series 10: T2 · axial · 5.0mm · 0.53mm/px · 1 of 25 slices shown]
[im 1/25]
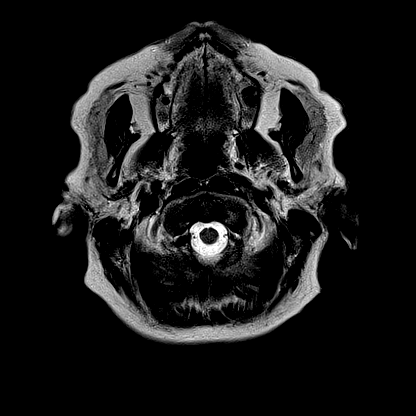

[Series 12: pha_images · axial · 3.0mm · 0.90mm/px · z∈[-91,+85]mm · 3 of 58 slices shown]
[im 1/58]
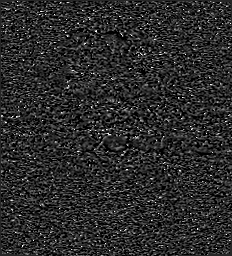
[im 29/58]
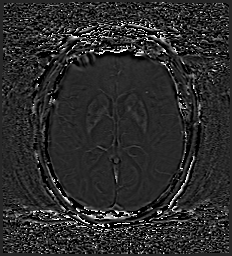
[im 58/58]
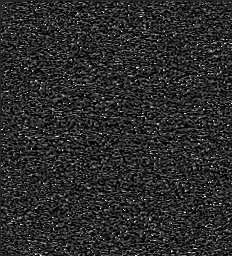

[Series 13: swi_images · axial · 3.0mm · 0.90mm/px · z∈[-91,+85]mm · 3 of 60 slices shown]
[im 1/60]
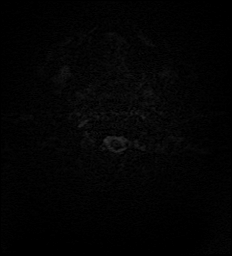
[im 30/60]
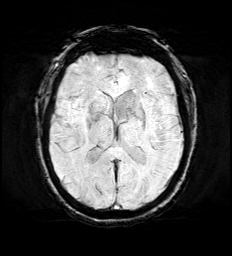
[im 60/60]
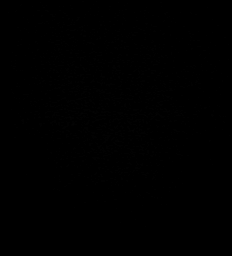

[Series 15: FLAIR · axial · 3.0mm · 0.53mm/px · z∈[-84,+78]mm · 3 of 55 slices shown]
[im 1/55]
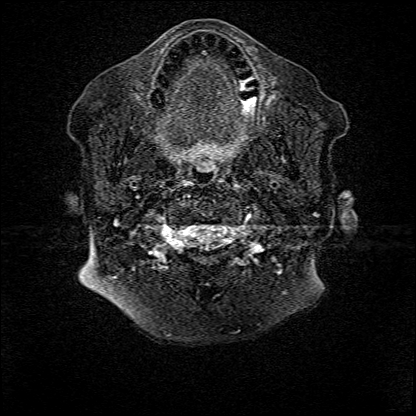
[im 28/55]
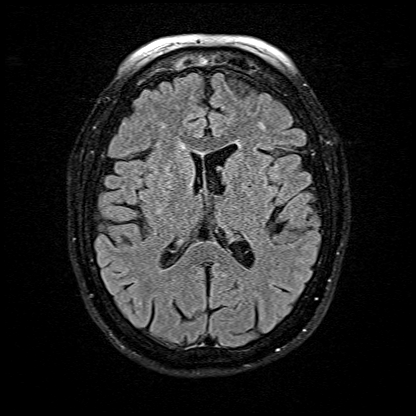
[im 55/55]
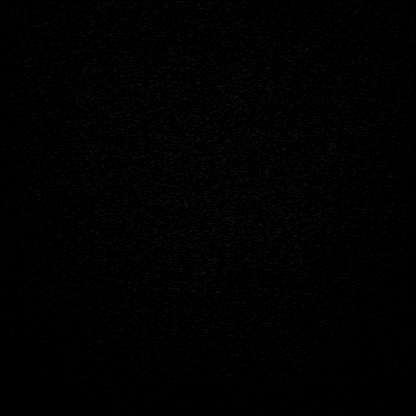

[Series 16: T1 · axial · 1.0mm · 0.98mm/px · z∈[-95,+80]mm · 9 of 176 slices shown (2 of 2)]
[im 1/176]
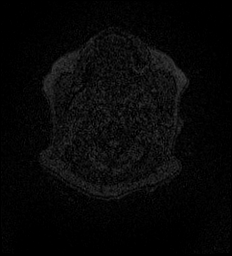
[im 20/176]
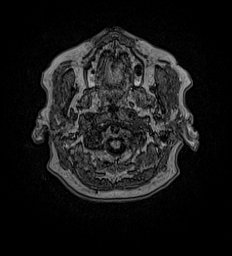
[im 39/176]
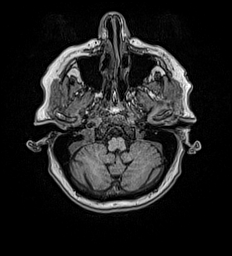
[im 59/176]
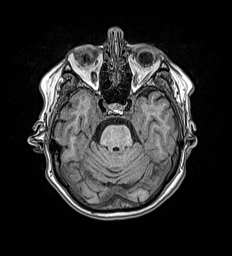
[im 78/176]
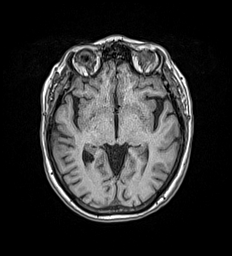
[im 98/176]
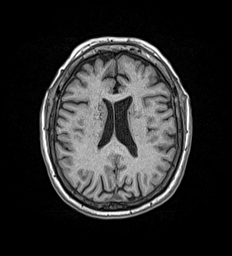
[im 117/176]
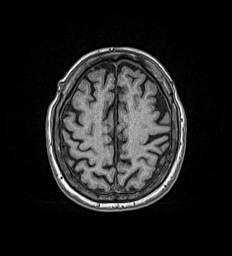
[im 156/176]
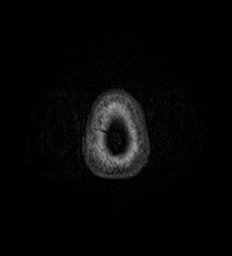
[im 176/176]
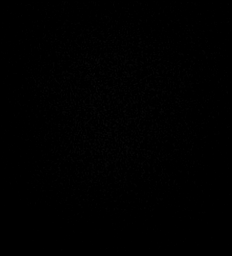

[Series 17: T2 post-contrast · coronal · 5.0mm · 0.57mm/px · 2 of 29 slices shown]
[im 1/29]
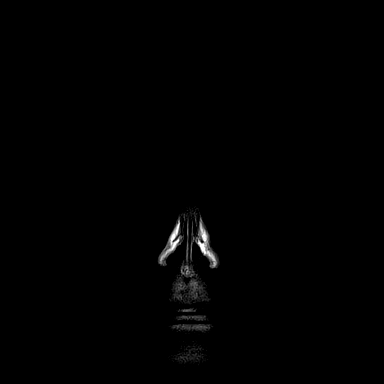
[im 29/29]
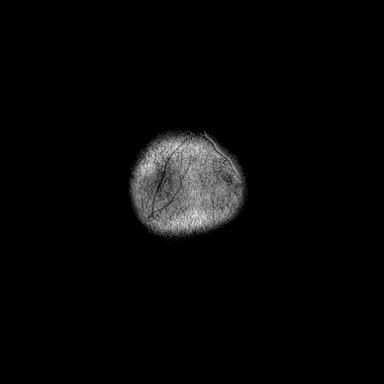

[Series 18: T1 post-contrast · axial · 1.0mm · 0.98mm/px · z∈[-95,+80]mm · 10 of 176 slices shown (1 of 3)]
[im 1/176]
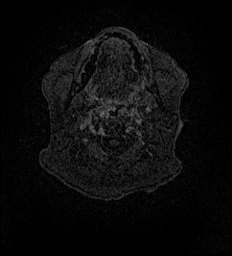
[im 20/176]
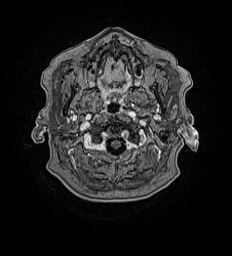
[im 39/176]
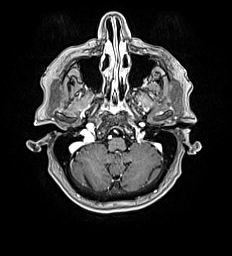
[im 59/176]
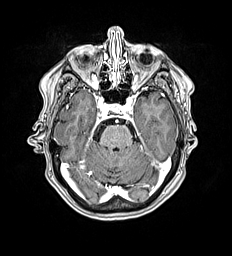
[im 78/176]
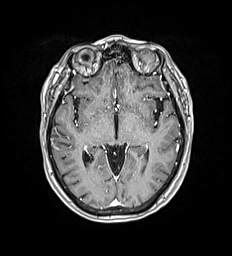
[im 98/176]
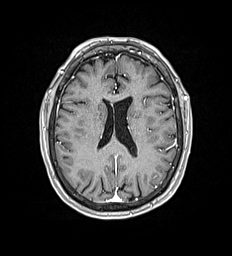
[im 117/176]
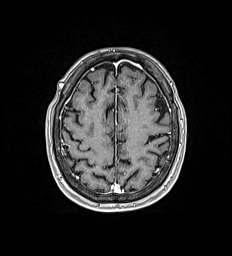
[im 137/176]
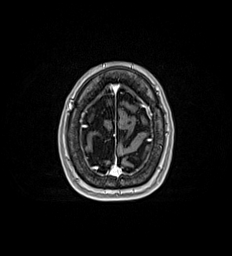
[im 156/176]
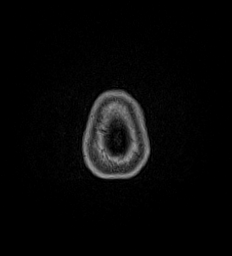
[im 176/176]
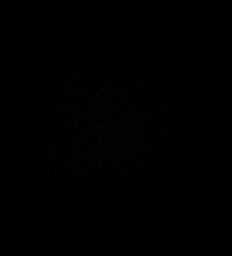

[Series 19: T1 post-contrast · coronal · 5.0mm · 0.57mm/px · 2 of 29 slices shown (2 of 3)]
[im 1/29]
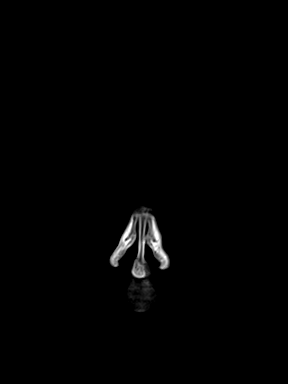
[im 29/29]
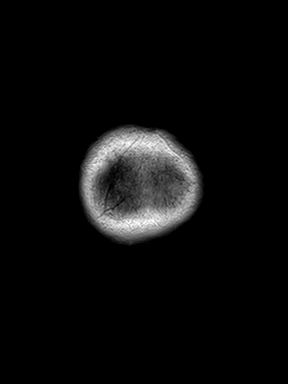

[Series 20: T1 post-contrast · sagittal · 5.0mm · 0.62mm/px · 1 of 23 slices shown (3 of 3)]
[im 1/23]
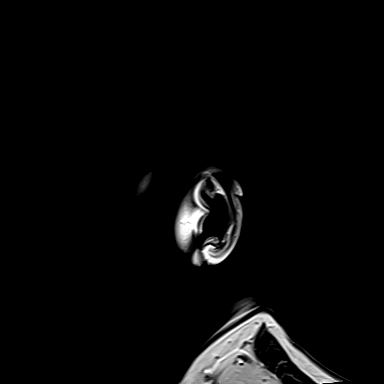

[47 of 48 positions shown; findings below may reference images not displayed]

FINDINGS: Brain: Diffusion imaging does not show any acute or subacute
infarction. The brainstem is normal. Single small vessel infarction
in the right cerebellum. Cerebral hemispheres show mild chronic
small-vessel ischemic changes of the white matter. No cortical or
large vessel territory infarction. No mass lesion, hemorrhage,
hydrocephalus or extra-axial collection. After contrast
administration, no abnormal enhancement occurs.

Vascular: Major vessels at the base of the brain show flow.

Skull and upper cervical spine: Negative

Sinuses/Orbits: Clear/normal

Other: None
IMPRESSION: No acute or reversible finding. No specific cause of the clinical
presentation is identified. Mild small vessel change of the cerebral
hemispheric white matter, less than often seen at this age.

## 2021-02-15 DIAGNOSIS — M4312 Spondylolisthesis, cervical region: Secondary | ICD-10-CM | POA: Diagnosis not present

## 2021-02-15 DIAGNOSIS — M542 Cervicalgia: Secondary | ICD-10-CM | POA: Diagnosis not present

## 2021-02-17 ENCOUNTER — Other Ambulatory Visit
Admission: RE | Admit: 2021-02-17 | Discharge: 2021-02-17 | Disposition: A | Discharge status: Home / Self Care | Payer: Medicare Other | Source: Ambulatory Visit | Attending: Cardiology | Admitting: Cardiology

## 2021-02-17 DIAGNOSIS — R0602 Shortness of breath: Secondary | ICD-10-CM | POA: Diagnosis not present

## 2021-02-17 DIAGNOSIS — I272 Pulmonary hypertension, unspecified: Secondary | ICD-10-CM | POA: Insufficient documentation

## 2021-02-17 DIAGNOSIS — I10 Essential (primary) hypertension: Secondary | ICD-10-CM | POA: Diagnosis not present

## 2021-02-17 DIAGNOSIS — G4733 Obstructive sleep apnea (adult) (pediatric): Secondary | ICD-10-CM | POA: Diagnosis not present

## 2021-02-17 LAB — BRAIN NATRIURETIC PEPTIDE: B Natriuretic Peptide: 72.1 pg/mL (ref 0.0–100.0)

## 2021-02-24 ENCOUNTER — Other Ambulatory Visit: Payer: Self-pay

## 2021-02-24 ENCOUNTER — Encounter: Payer: Self-pay | Admitting: Physician Assistant

## 2021-02-24 ENCOUNTER — Ambulatory Visit: Payer: Medicare Other | Admitting: Physician Assistant

## 2021-02-24 DIAGNOSIS — G4710 Hypersomnia, unspecified: Secondary | ICD-10-CM

## 2021-02-24 DIAGNOSIS — J45990 Exercise induced bronchospasm: Secondary | ICD-10-CM

## 2021-02-24 DIAGNOSIS — Z7189 Other specified counseling: Secondary | ICD-10-CM | POA: Diagnosis not present

## 2021-02-24 DIAGNOSIS — G4733 Obstructive sleep apnea (adult) (pediatric): Secondary | ICD-10-CM

## 2021-02-24 DIAGNOSIS — R0602 Shortness of breath: Secondary | ICD-10-CM

## 2021-02-24 MED ORDER — ALBUTEROL SULFATE HFA 108 (90 BASE) MCG/ACT IN AERS
2.0000 | INHALATION_SPRAY | Freq: Four times a day (QID) | RESPIRATORY_TRACT | 2 refills | Status: DC | PRN
Start: 2021-02-24 — End: 2022-11-09
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
## 2021-02-25 DIAGNOSIS — G4733 Obstructive sleep apnea (adult) (pediatric): Secondary | ICD-10-CM | POA: Diagnosis not present

## 2021-03-01 NOTE — Patient Instructions (Signed)

## 2021-03-02 DIAGNOSIS — I272 Pulmonary hypertension, unspecified: Secondary | ICD-10-CM | POA: Diagnosis not present

## 2021-03-02 DIAGNOSIS — R0602 Shortness of breath: Secondary | ICD-10-CM | POA: Diagnosis not present

## 2021-03-07 ENCOUNTER — Other Ambulatory Visit: Payer: Self-pay | Admitting: Family Medicine

## 2021-03-08 ENCOUNTER — Telehealth: Payer: Medicare Other

## 2021-03-10 ENCOUNTER — Telehealth: Payer: Self-pay

## 2021-03-10 NOTE — Telephone Encounter (Signed)
Patient scheduled for PSG on 04/09/21 @ [facility]. tat

## 2021-03-14 ENCOUNTER — Other Ambulatory Visit: Payer: Self-pay

## 2021-03-14 ENCOUNTER — Encounter: Payer: Self-pay | Admitting: Family Medicine

## 2021-03-14 ENCOUNTER — Telehealth (INDEPENDENT_AMBULATORY_CARE_PROVIDER_SITE_OTHER): Payer: Medicare Other | Admitting: Family Medicine

## 2021-03-14 VITALS — BP 160/84 | HR 83 | Ht <= 58 in | Wt 172.0 lb

## 2021-03-14 DIAGNOSIS — G4733 Obstructive sleep apnea (adult) (pediatric): Secondary | ICD-10-CM | POA: Diagnosis not present

## 2021-03-14 DIAGNOSIS — Z9989 Dependence on other enabling machines and devices: Secondary | ICD-10-CM

## 2021-03-14 DIAGNOSIS — I10 Essential (primary) hypertension: Secondary | ICD-10-CM | POA: Diagnosis not present

## 2021-03-14 MED ORDER — AMLODIPINE BESYLATE 5 MG PO TABS: 5.0000 mg | ORAL_TABLET | Freq: Every day | ORAL | 0 refills | Status: DC

## 2021-03-14 NOTE — Patient Instructions (Addendum)
Try to eat less processed foods   Try to get most of your carbohydrates from produce (with the exception of white potatoes)  Eat less bread/pasta/rice/snack foods/cereals/sweets and other items from the middle of the grocery store (processed carbs)  Continue the 5 mg amlodipine daily  Also hctz 25 mg daily   Continue to monitor blood pressure -when relaxed and sitting still with arm at heart level if able

## 2021-03-14 NOTE — Assessment & Plan Note (Addendum)
BP at home has been controlled for the most part with an occasional spike  Today 160/84 but after resting went down to 138/78 Will continue to monitor  Handout given re: BP monitoring Disc lifestyle change (DASH eating) and gradual return to exercise when able  Also working on weight loss Having another sleep study (will change cpap settings if needed)   Reviewed guidelines for sodium intake (would rather not have high sodium frozen meals) Plan to continue  Amlodipine 5 mg daily  hctz 25 mg daily

## 2021-03-14 NOTE — Assessment & Plan Note (Signed)
Pt is planning new sleep study in feb  Will change settings if needed Sleep is disturbed  Followed by pulmonary

## 2021-03-14 NOTE — Progress Notes (Signed)
Virtual Visit via Video Note  I connected with Erin Petersen on 03/14/21 at 10:30 AM EST by a video enabled telemedicine application and verified that I am speaking with the correct person using two identifiers.  Location: Patient: home Provider: office    I discussed the limitations of evaluation and management by telemedicine and the availability of in person appointments. The patient expressed understanding and agreed to proceed.  Parties involved in encounter  Patient: Erin Petersen  Provider:  Loura Pardon MD   History of Present Illness:  Pt presents for HTN   Overall 120s-130s most of the time at home   Three times it was kind of high - gets spikes  One day 151/76 bedtime Once 062I systolic at lunch   Arm cuff that has been calibrated  Sitting , feet on floor  Not always elevated arm   Wt Readings from Last 3 Encounters:  03/14/21 172 lb (78 kg)  02/24/21 172 lb (78 kg)  11/17/20 163 lb 6 oz (74.1 kg)   Weight went up with holiday eating   She is back to regular eating  However she cooks less    BP Readings from Last 3 Encounters:  03/14/21 (!) 160/84  02/24/21 (!) 150/81  02/01/21 136/84   Today after sitting 138/78 with pulse of 75    Amlodipine 2.5 mg daily  In the past 5 mg was too much -now back on 5  (no trouble now with low bp)  Trying to be active Had covid in November - more sob since then and has to use her albuterol inhaler more  She did have cardiology visit- very reassuring  OSA -cpap for over 10 years  Planning another sleep study in February  Is very restless in sleep  She uses nasal pillows and cpap  The machine gets tangled and sometimes pulls off  Having day time sleepiness  (poor sleep quality) Also urinates 4 times per night    Hctz 25 mg daily   Lab Results  Component Value Date   CREATININE 0.71 10/21/2020   BUN 14 10/21/2020   NA 130 (L) 10/21/2020   K 3.6 10/21/2020   CL 93 (L) 10/21/2020   CO2 25 10/21/2020    Lab Results  Component Value Date   CHOL 163 08/10/2020   HDL 36.20 (L) 08/10/2020   LDLCALC 67 03/12/2020   LDLDIRECT 90.0 08/10/2020   TRIG 368.0 (H) 08/10/2020   CHOLHDL 5 08/10/2020   Patient Active Problem List   Diagnosis Date Noted   Hyperlipidemia 11/17/2020   S/P TKR (total knee replacement) using cement, left 10/12/2020   Somnolence, daytime 09/09/2020   Rash 09/09/2020   Medicare annual wellness visit, subsequent 08/25/2020   Encounter for screening mammogram for breast cancer 08/25/2020   Encounter for general adult medical examination with abnormal findings 08/25/2020   GERD (gastroesophageal reflux disease) 08/25/2020   Dizzy spells 08/25/2020   Migraine 03/12/2020   IBS (irritable bowel syndrome) 03/12/2020   S/P TKR (total knee replacement) using cement, right 01/01/2020   History of vaginal hysterectomy 06/11/2019   Estrogen deficiency 04/22/2019   History of skin cancer 04/22/2019   High-tone pelvic floor dysfunction 04/03/2019   IC (interstitial cystitis) 04/03/2019   Neuralgia of both pudendal nerves 04/03/2019   Pelvic pain in female 04/03/2019   Osteoporosis 02/12/2019   Asthma 01/31/2019   Adverse reaction to influenza vaccine, initial encounter 12/03/2018   Vitamin D deficiency 12/03/2018   Restless legs syndrome 11/28/2018  Small fiber neuropathy 11/28/2018   Allergic rhinitis 10/08/2018   Benign essential hypertension 10/08/2018   Chronic bladder pain 10/08/2018   Neuropathy 10/08/2018   Osteoarthritis 10/08/2018   S/P BSO (bilateral salpingo-oophorectomy) 01/07/2018   OSA on CPAP 12/29/2016   Other and unspecified disc disorder of lumbar region 10/25/2000   Prediabetes 09/24/1998   Tinnitus of both ears 09/24/1983   Past Medical History:  Diagnosis Date   Arthritis    Asthma    exercise induced asthma   Bacteremia due to Gram-negative bacteria 06/04/2019   Colon polyps    Diabetes mellitus without complication (HCC)    Duodenal  ulcer    Dyspnea    GERD (gastroesophageal reflux disease)    Hyperlipidemia    Hypertension    Neuropathy    right leg and foot   Plantar fasciitis    Pre-diabetes    Prediabetes    RLQ abdominal pain    RLS (restless legs syndrome)    Sleep apnea    CPAP   SOB (shortness of breath)    Urinary incontinence    Urinary tract infection    Wears hearing aid in right ear    Past Surgical History:  Procedure Laterality Date   [place]  2002   lumbar fusion   BILATERAL SALPINGOOPHORECTOMY  01/07/2018   BIOPSY  04/15/2019   Procedure: BIOPSY;  Surgeon: [name], MD;  Location: [facility];  Service: Endoscopy;;   CARDIAC CATHETERIZATION  2019   COLONOSCOPY WITH PROPOFOL N/A 11/11/2018   Procedure: COLONOSCOPY WITH PROPOFOL;  Surgeon: [name], MD;  Location: [facility];  Service: Gastroenterology;  Laterality: N/A;   CYSTO WITH HYDRODISTENSION N/A 12/30/2018   Procedure: CYSTOSCOPY/HYDRODISTENSION WITH MARCAINE;  Surgeon: Bjorn Loser, MD;  Location: [facility];  Service: Urology;  Laterality: N/A;   ESOPHAGOGASTRODUODENOSCOPY (EGD) WITH PROPOFOL N/A 04/15/2019   Procedure: ESOPHAGOGASTRODUODENOSCOPY (EGD) WITH PROPOFOL;  Surgeon: [name], MD;  Location: [place] [facility];  Service: Endoscopy;  Laterality: N/A;  sleep apnea   ESOPHAGOGASTRODUODENOSCOPY (EGD) WITH PROPOFOL N/A 07/09/2019   Procedure: ESOPHAGOGASTRODUODENOSCOPY (EGD) WITH PROPOFOL;  Surgeon: [name], MD;  Location: [place];  Service: Gastroenterology;  Laterality: N/A;   EYE SURGERY Bilateral    HERNIA REPAIR  2018   abd   HERNIA REPAIR     umbilical   INGUINAL HERNIA REPAIR Bilateral    inguinal   JOINT REPLACEMENT     LAPAROSCOPIC BILATERAL SALPINGO OOPHERECTOMY  2019   SPINAL FUSION  2002   TONSILLECTOMY AND ADENOIDECTOMY  1961   TOTAL KNEE ARTHROPLASTY Right 01/01/2020   Procedure:  Right total knee arthroplasty - [name] to Assist;  Surgeon: Hessie Knows, MD;  Location: [facility];  Service: Orthopedics;  Laterality: Right;   TOTAL KNEE ARTHROPLASTY Left 10/12/2020   Procedure: TOTAL KNEE ARTHROPLASTY;  Surgeon: Hessie Knows, MD;  Location: [facility];  Service: Orthopedics;  Laterality: Left;   TUBAL LIGATION     Social History   Tobacco Use   Smoking status: Never   Smokeless tobacco: Never  Vaping Use   Vaping Use: Never used  Substance Use Topics   Alcohol use: Not Currently   Drug use: Never   Family History  Problem Relation Age of Onset   Alcohol abuse Mother    Cancer Mother    Stroke Mother    Alcohol abuse Father    Arthritis Sister  Arthritis Daughter    COPD Daughter    Arthritis Paternal Grandmother    Asthma Paternal Grandmother    COPD Paternal Grandmother    Breast cancer Paternal Aunt    Allergies  Allergen Reactions   Bee Venom Anaphylaxis and Hives   Fosamax [Alendronate] Nausea Only   Ciprofloxacin Hcl Rash    Mild rash after long term use   Sulfa Antibiotics Rash    Bactrim   Current Outpatient Medications on File Prior to Visit  Medication Sig Dispense Refill   acetaminophen (TYLENOL) 650 MG CR tablet Take 1,300 mg by mouth in the morning and at bedtime.     albuterol (VENTOLIN HFA) 108 (90 Base) MCG/ACT inhaler Inhale 2 puffs into the lungs every 6 (six) hours as needed for wheezing or shortness of breath. 2 each 2   amitriptyline (ELAVIL) 25 MG tablet Take 1 tablet (25 mg total) by mouth at bedtime. 90 tablet 3   aspirin 81 MG EC tablet Take 81 mg by mouth at bedtime.      cephALEXin (KEFLEX) 500 MG capsule Take 500 mg by mouth. PRN dentist appts for 1 year following knee replacement     Cholecalciferol (VITAMIN D3) 50 MCG (2000 UT) TABS Take 5,000 Units by mouth daily.     Cyanocobalamin (VITAMIN B-12) 1000 MCG SUBL Take 2,000 mcg by mouth daily.     EPINEPHrine 0.3 mg/0.3 mL IJ SOAJ injection Inject 0.3 mg into the  muscle as needed for anaphylaxis.     fluticasone (FLONASE) 50 MCG/ACT nasal spray Place 2 sprays into both nostrils daily. (Patient taking differently: Place 2 sprays into both nostrils at bedtime.) 16 g 3   hydrochlorothiazide (HYDRODIURIL) 25 MG tablet TAKE 1 TABLET DAILY 90 tablet 3   MELATONIN PO Take 1 tablet by mouth at bedtime.     meloxicam (MOBIC) 15 MG tablet Take 15 mg by mouth daily.     omeprazole (PRILOSEC) 40 MG capsule Take 40 mg by mouth in the morning.     polyethylene glycol (MIRALAX / GLYCOLAX) 17 g packet Take 1 packet by mouth daily.      pregabalin (LYRICA) 150 MG capsule Take 1 capsule (150 mg total) by mouth 2 (two) times daily. (Patient taking differently: Take 150 mg by mouth See admin instructions. Take 150 mg by mouth in the morning and  300 mg in the afternoon) 20 capsule 0   rOPINIRole (REQUIP) 0.25 MG tablet Take 0.75 mg by mouth in the morning and at bedtime. One in am and one in afternoon     rOPINIRole (REQUIP) 4 MG tablet TAKE 1 TABLET AT BEDTIME 90 tablet 3   simvastatin (ZOCOR) 20 MG tablet Take 1 tablet (20 mg total) by mouth at bedtime. 90 tablet 3   ondansetron (ZOFRAN) 4 MG tablet Take 1 tablet (4 mg total) by mouth every 8 (eight) hours as needed for nausea or vomiting. (Patient not taking: Reported on 03/14/2021) 20 tablet 0   TURMERIC PO Take 1,000 mg by mouth 2 (two) times daily. (Patient not taking: Reported on 03/14/2021)     No current facility-administered medications on file prior to visit.     Review of Systems  Constitutional:  Negative for chills, fever and malaise/fatigue.       Sleepiness/fatigue   HENT:  Negative for congestion, ear pain, sinus pain and sore throat.   Eyes:  Negative for blurred vision, discharge and redness.  Respiratory:  Negative for cough, shortness of breath and stridor.  Cardiovascular:  Negative for chest pain, palpitations and leg swelling.  Gastrointestinal:  Negative for abdominal pain, diarrhea, nausea and  vomiting.  Musculoskeletal:  Negative for myalgias.  Skin:  Negative for rash.  Neurological:  Negative for dizziness and headaches.   Observations/Objective: Patient appears well, in no distress Weight is baseline  No facial swelling or asymmetry Normal voice-not hoarse and no slurred speech No obvious tremor or mobility impairment Moving neck and UEs normally Able to hear the call well  No cough or shortness of breath during interview  Talkative and mentally sharp with no cognitive changes No skin changes on face or neck , no rash or pallor Affect is normal    Assessment and Plan: Problem List Items Addressed This Visit       Cardiovascular and Mediastinum   Benign essential hypertension - Primary    BP at home has been controlled for the most part with an occasional spike  Today 160/84 but after resting went down to 138/78 Will continue to monitor  Handout given re: BP monitoring Disc lifestyle change (DASH eating) and gradual return to exercise when able  Also working on weight loss Having another sleep study (will change cpap settings if needed)   Reviewed guidelines for sodium intake (would rather not have high sodium frozen meals) Plan to continue  Amlodipine 5 mg daily  hctz 25 mg daily       Relevant Medications   amLODipine (NORVASC) 5 MG tablet     Respiratory   OSA on CPAP    Pt is planning new sleep study in feb  Will change settings if needed Sleep is disturbed  Followed by pulmonary        Follow Up Instructions: Try to eat less processed foods   Try to get most of your carbohydrates from produce (with the exception of white potatoes)  Eat less bread/pasta/rice/snack foods/cereals/sweets and other items from the middle of the grocery store (processed carbs)  Continue the 5 mg amlodipine daily  Also hctz 25 mg daily   Continue to monitor blood pressure -when relaxed and sitting still with arm at heart level if able    I discussed the  assessment and treatment plan with the patient. The patient was provided an opportunity to ask questions and all were answered. The patient agreed with the plan and demonstrated an understanding of the instructions.   The patient was advised to call back or seek an in-person evaluation if the symptoms worsen or if the condition fails to improve as anticipated.     Loura Pardon, MD

## 2021-03-21 ENCOUNTER — Ambulatory Visit: Payer: Medicare Other | Admitting: Internal Medicine

## 2021-04-09 ENCOUNTER — Encounter (INDEPENDENT_AMBULATORY_CARE_PROVIDER_SITE_OTHER): Payer: Medicare Other | Admitting: Internal Medicine

## 2021-04-09 DIAGNOSIS — G4719 Other hypersomnia: Secondary | ICD-10-CM

## 2021-04-11 NOTE — Procedures (Signed)
[facility] Report Part I    Phone: [phone] Fax: [phone]  Patient Name: Lester, Platas Acquisition Number: [id number]  Date of Birth: [birth year] Acquisition Date: 04/09/2021  Referring Physician: Drema Dallas, PA-C     History: The patient is a 81 year old female who was referred for reevaluation of obstructive sleep apnea. Medical History: arthritis,asthma, bactermia, diabetes, duodenal ulcer, dyspnea, GERD, hyperlipidemia, hypertension, neuropathy right leg and foot, planter fasciitis, prediabetes, RLS, sleep apnea , SOB.  Medications: Tylonol, Elavil, Norvasc, aspirin 81, Keflex, vitamin D3, vitamin B12,eEpinephrine, Flonase, hydrochlorothiazide, meletonin PO, Mobic, Prilosec, Zofran, Miralax, Lyrica, Requip, Zocor, tumeric and albuterol.  Procedure: This routine overnight polysomnogram was performed on the [facility] 5 using the standard diagnostic protocol. This included 6 channels of EEG, 2 channels of EOG, chin EMG, bilateral anterior tibialis EMG, nasal/oral thermistor, PTAF (nasal pressure transducer), chest and abdominal wall movements, EKG, and pulse oximetry.  Description: The total recording time was 384.3 minutes. The total sleep time was 340.5 minutes. There were a total of 25.7 minutes of wakefulness after sleep onset for a slightly reducedsleep efficiency of 88.6%. The latency to sleep onset was within normal limitsat 18.1 minutes. The R sleep onset latency was N/A.  Sleep parameters, as a percentage of the total sleep time, demonstrated 15.0% of sleep was in N1 sleep, 85.0% N2, 0.0% N3 and 0.0% R sleep. There were a total of 98 arousals for an arousal index of 17.3 arousals per hour of sleep that was elevated.  Respiratory monitoring demonstrated nearly continuous moderate  snoring. There were no obstructive apneas, hypopneas or respiratory effort related arousals (RERAs) observed during the recording.  The baseline oxygen saturation during  wakefulness was 96%, during NREM sleep averaged 94%. The total duration of oxygen < 90% was 0.0 minutes.  Cardiac monitoring There were no significant cardiac rhythm irregularities.   Periodic limb movement monitoring demonstrated that there were 4 periodic limb movements for a periodic limb movement index of 0.7 periodic limb movements per hour of sleep. Frequent, leg tremors were observed prior to sleep onset.    Impression: This repeat overnight polysomnogram did not confirm the presence of obstructive sleep apnea. There were no obstructive apneas, hypopneas or significant RERAs during the recording. The findings are limited by the fact that the patient has been using CPAP regularly. This can cause an underestimation of the presence of sleep apnea. In addition, REM sleep was not observed.  Frequent, leg tremors were observed prior to sleep onset. The patient has  a history of restless leg syndrome. Clinical correlation is suggested.  There was a  slightly reduced sleep efficiency with anelevated arousal index,increased awakeningsand no REM or slow wave sleep.  Recommendations: Repeat the polysomnogram after a night of sleep restriction to help increase REM pressure.  CPAP should be discontinued for a minimum of 4 nights prior to retesting.    Allyne Gee, MD, [facility] Diplomate ABMS-Pulmonary, Critical Care and Sleep Medicine  Electronically reviewed and digitally signed   [facility] Report Part II  Phone: [phone] Fax: [phone]  Patient last name Nygren Neck Size 15.0 in. Acquisition [id number]  Patient first name Caylen Weight 165.0 lbs. Started 04/09/2021 at 11:26:55 PM  Birth date 12-11-1940 Height 59.0 in. Stopped 04/10/2021 at 62:31:43 AM  Age 51 BMI 33.3 lb/in2 Duration 384.3  Study Type Adult      Report generated by Paulo Fruit., RPSGT Reviewed by: Richelle Ito. Saunders Glance, PhD, ABSM, FAASM Sleep  Data: Lights Out: 11:32:49 PM Sleep Onset: 11:50:55 PM   Lights On: 5:57:07 AM Sleep Efficiency: 88.6 %  Total Recording Time: 384.3 min Sleep Latency (from Lights Off) 18.1 min  Total Sleep Time (TST): 340.5 min R Latency (from Sleep Onset): N/A  Sleep Period Time: 366.0 min Total number of awakenings: 16  Wake during sleep: 25.5 min Wake After Sleep Onset (WASO): 25.7 min   Sleep Data:         Arousal Summary: Stage  Latency from lights out (min) Latency from sleep onset (min) Duration (min) % Total Sleep Time  Normal values  N 1 18.1 0.0 51.0 15.0 (5%)  N 2 22.1 4.0 289.5 85.0 (50%)  N 3       0.0 0.0 (20%)  R N/A N/A 0.0 0.0 (25%)    Number Index  Spontaneous 63 11.1  Apneas & Hypopneas 0 0.0  RERAs 0 0.0       (Apneas & Hypopneas & RERAs)  (0) (0.0)  Limb Movement 35 6.2  Snore 0 0.0  TOTAL 98 17.3     Respiratory Data:  CA OA MA Apnea Hypopnea* A+ H RERA Total  Number 0 0 0 0 0 0 0 0  Mean Dur (sec) 0.0 0.0 0.0 0.0 0.0 0.0 0.0 0.0  Max Dur (sec) 0.0 0.0 0.0 0.0 0.0 0.0 0.0 0.0  Total Dur (min) 0.0 0.0 0.0 0.0 0.0 0.0 0.0 0.0  % of TST 0.0 0.0 0.0 0.0 0.0 0.0 0.0 0.0  Index (#/h TST) 0.0 0.0 0.0 0.0 0.0 0.0 0.0 0.0  *Hypopneas scored based on 4% or greater desaturation.  Sleep Stage:        REM NREM TST  AHI N/A 0.0 0.0  RDI N/A 0.0 0.0           Body Position Data:  Sleep (min) TST (%) REM (min) NREM (min) CA (#) OA (#) MA (#) HYP (#) AHI (#/h) RERA (#) RDI (#/h) Desat (#)  Supine 87.0 25.55 0.0 87.0 0 0 0 0 0.0 0 0.00 10  Non-Supine 253.50 74.45 0.00 253.50 0.00 0.00 0.00 0.00 0.00 0 0.00 12.00  Left: 124.9 36.68 0.0 124.9 0 0 0 0 0.0 0 0.00 8  Right: 128.6 37.77 0.0 128.6 0 0 0 0 0.0 0 0.00 4     Snoring: Total number of snoring episodes  0  Total time with snoring    min (   % of sleep)   Oximetry Distribution:             WK REM NREM TOTAL  Average (%)   96    94 94  < 90% 0.0 0.0 0.0 0.0  < 80% 0.0 0.0 0.0 0.0  < 70% 0.0 0.0 0.0 0.0  # of Desaturations* 2 0 20 22  Desat Index  (#/hour) 4.2    3.5 3.9  Desat Max (%) 4 0 6 6  Desat Max Dur (sec) 110.0 0.0 101.0 110.0  Approx Min O2 during sleep 89  Approx min O2 during a respiratory event     Was Oxygen added (Y/N) and final rate No:   0 LPM  *Desaturations based on 3% or greater drop from baseline.   Cheyne Stokes Breathing: None Present   Heart Rate Summary:  Average Heart Rate During Sleep 39.3 bpm      Highest Heart Rate During Sleep (95th %) 113.0 bpm      Highest Heart Rate During Sleep 255 bpm (artifact)  Highest Heart Rate During Recording (TIB) 255 bpm (artifact)   Heart Rate Observations: Event Type # Events   Bradycardia 0 Lowest HR Scored: N/A  Sinus Tachycardia During Sleep 0 Highest HR Scored: N/A  Narrow Complex Tachycardia 0 Highest HR Scored: N/A  Wide Complex Tachycardia 0 Highest HR Scored: N/A  Asystole 0 Longest Pause: N/A  Atrial Fibrillation 0 Duration Longest Event: N/A  Other Arrythmias  No Type:    Periodic Limb Movement Data: (Primary legs unless otherwise noted) Total # Limb Movement 53 Limb Movement Index 9.3  Total # PLMS 4 PLMS Index 0.7  Total # PLMS Arousals 4 PLMS Arousal Index 0.7  Percentage Sleep Time with PLMS 3.82mn (1.0 % sleep)  Mean Duration limb movements (secs) 210.0

## 2021-04-14 ENCOUNTER — Telehealth: Payer: Self-pay

## 2021-04-14 ENCOUNTER — Encounter: Payer: Self-pay | Admitting: Internal Medicine

## 2021-04-14 NOTE — Telephone Encounter (Signed)
Patient scheduled for repeat psg on 05/06/21 @ [facility]

## 2021-04-15 ENCOUNTER — Other Ambulatory Visit: Payer: Self-pay

## 2021-04-15 DIAGNOSIS — R0602 Shortness of breath: Secondary | ICD-10-CM

## 2021-04-18 ENCOUNTER — Ambulatory Visit: Payer: Medicare Other | Admitting: Internal Medicine

## 2021-04-29 ENCOUNTER — Telehealth: Payer: Self-pay

## 2021-04-29 NOTE — Telephone Encounter (Signed)
Left vm to confirm 05/04/21 appointment-Toni ?

## 2021-05-02 ENCOUNTER — Telehealth: Payer: Self-pay

## 2021-05-02 NOTE — Chronic Care Management (AMB) (Signed)
? ? ?  Chronic Care Management ?Pharmacy Assistant  ? ?Name: ALLEYAH TWOMBLY  MRN: [id number] DOB: 04-01-1940 ? ? ?Reason for Encounter: Reminder Call ?  ?Conditions to be addressed/monitored: ?HTN ? ? ? ?Medications: ?Outpatient Encounter Medications as of 05/02/2021  ?Medication Sig  ? acetaminophen (TYLENOL) 650 MG CR tablet Take 1,300 mg by mouth in the morning and at bedtime.  ? albuterol (VENTOLIN HFA) 108 (90 Base) MCG/ACT inhaler Inhale 2 puffs into the lungs every 6 (six) hours as needed for wheezing or shortness of breath.  ? amitriptyline (ELAVIL) 25 MG tablet Take 1 tablet (25 mg total) by mouth at bedtime.  ? amLODipine (NORVASC) 5 MG tablet Take 1 tablet (5 mg total) by mouth daily.  ? aspirin 81 MG EC tablet Take 81 mg by mouth at bedtime.   ? cephALEXin (KEFLEX) 500 MG capsule Take 500 mg by mouth. PRN dentist appts for 1 year following knee replacement  ? Cholecalciferol (VITAMIN D3) 50 MCG (2000 UT) TABS Take 5,000 Units by mouth daily.  ? Cyanocobalamin (VITAMIN B-12) 1000 MCG SUBL Take 2,000 mcg by mouth daily.  ? EPINEPHrine 0.3 mg/0.3 mL IJ SOAJ injection Inject 0.3 mg into the muscle as needed for anaphylaxis.  ? fluticasone (FLONASE) 50 MCG/ACT nasal spray Place 2 sprays into both nostrils daily. (Patient taking differently: Place 2 sprays into both nostrils at bedtime.)  ? hydrochlorothiazide (HYDRODIURIL) 25 MG tablet TAKE 1 TABLET DAILY  ? MELATONIN PO Take 1 tablet by mouth at bedtime.  ? meloxicam (MOBIC) 15 MG tablet Take 15 mg by mouth daily.  ? omeprazole (PRILOSEC) 40 MG capsule Take 40 mg by mouth in the morning.  ? ondansetron (ZOFRAN) 4 MG tablet Take 1 tablet (4 mg total) by mouth every 8 (eight) hours as needed for nausea or vomiting. (Patient not taking: Reported on 03/14/2021)  ? polyethylene glycol (MIRALAX / GLYCOLAX) 17 g packet Take 1 packet by mouth daily.   ? pregabalin (LYRICA) 150 MG capsule Take 1 capsule (150 mg total) by mouth 2 (two) times daily. (Patient taking  differently: Take 150 mg by mouth See admin instructions. Take 150 mg by mouth in the morning and  300 mg in the afternoon)  ? rOPINIRole (REQUIP) 0.25 MG tablet Take 0.75 mg by mouth in the morning and at bedtime. One in am and one in afternoon  ? rOPINIRole (REQUIP) 4 MG tablet TAKE 1 TABLET AT BEDTIME  ? simvastatin (ZOCOR) 20 MG tablet Take 1 tablet (20 mg total) by mouth at bedtime.  ? TURMERIC PO Take 1,000 mg by mouth 2 (two) times daily. (Patient not taking: Reported on 03/14/2021)  ? ?No facility-administered encounter medications on file as of 05/02/2021.  ? ?EMAYA PRESTON was contacted to remind of upcoming telephone visit with Charlene Brooke on 05/05/21 at 11:00am. Patient was reminded to have all medications, supplements and any blood glucose and blood pressure readings available for review at appointment. If unable to reach, a voicemail was left for patient.  ? ? ?Are you having any problems with your medications? No  ? ?Do you have any concerns you like to discuss with the pharmacist? No ? ? ? ?Star Rating Drugs: ?Medication:  Last Fill: Day Supply ?Simvastatin 44m 03/07/21  90 ? ?LCharlene Brooke [facility] CPP notified ? ? ?Shenandoah Yeats, CCMA ?[facility] Pharmacy Assistant ?[phone]? ?Total time spent for month ?CPA: 10 min ?

## 2021-05-04 ENCOUNTER — Other Ambulatory Visit: Payer: Self-pay | Admitting: Physical Medicine & Rehabilitation

## 2021-05-04 ENCOUNTER — Ambulatory Visit: Payer: Medicare Other | Admitting: Internal Medicine

## 2021-05-04 ENCOUNTER — Other Ambulatory Visit: Payer: Self-pay

## 2021-05-04 DIAGNOSIS — R0602 Shortness of breath: Secondary | ICD-10-CM

## 2021-05-04 DIAGNOSIS — M5442 Lumbago with sciatica, left side: Secondary | ICD-10-CM | POA: Diagnosis not present

## 2021-05-04 DIAGNOSIS — G8929 Other chronic pain: Secondary | ICD-10-CM

## 2021-05-04 DIAGNOSIS — M5441 Lumbago with sciatica, right side: Secondary | ICD-10-CM | POA: Diagnosis not present

## 2021-05-05 ENCOUNTER — Ambulatory Visit (INDEPENDENT_AMBULATORY_CARE_PROVIDER_SITE_OTHER): Payer: Medicare Other | Admitting: Pharmacist

## 2021-05-05 DIAGNOSIS — N3010 Interstitial cystitis (chronic) without hematuria: Secondary | ICD-10-CM

## 2021-05-05 DIAGNOSIS — G2581 Restless legs syndrome: Secondary | ICD-10-CM

## 2021-05-05 DIAGNOSIS — M810 Age-related osteoporosis without current pathological fracture: Secondary | ICD-10-CM

## 2021-05-05 DIAGNOSIS — I10 Essential (primary) hypertension: Secondary | ICD-10-CM

## 2021-05-05 DIAGNOSIS — J4520 Mild intermittent asthma, uncomplicated: Secondary | ICD-10-CM

## 2021-05-05 DIAGNOSIS — K219 Gastro-esophageal reflux disease without esophagitis: Secondary | ICD-10-CM

## 2021-05-05 DIAGNOSIS — E7800 Pure hypercholesterolemia, unspecified: Secondary | ICD-10-CM

## 2021-05-05 NOTE — Progress Notes (Signed)
Chronic Care Management Pharmacy Note  05/05/2021 Name:  Erin Petersen MRN:  [id number] DOB:  1940-05-10  Summary: CCM F/U visit -Pt is still experiencing poor sleep and is undergoing sleep evaluation (repeat sleep study later this week) -Pt is taking omeprazole every other day; she has not tried H2RA before; she has osteopenia and continued PPI use risks worsening bone density  Recommendations/Changes made from today's visit: -Recommend trial off PPI; replace with famotidine 20 mg daily or PRN  Plan: -[place] will call patient 6 month for general adherence review -Pharmacist follow up televisit scheduled for 1 year    Subjective: Erin Petersen is an 81 y.o. year old female who is a primary patient of Tower, Wynelle Fanny, MD.  The CCM team was consulted for assistance with disease management and care coordination needs.    Engaged with patient by telephone for follow up visit in response to provider referral for pharmacy case management and/or care coordination services.   Consent to Services:  The patient was given information about Chronic Care Management services, agreed to services, and gave verbal consent prior to initiation of services.  Please see initial visit note for detailed documentation.   Patient Care Team: Tower, Wynelle Fanny, MD as PCP - General (Family Medicine) Charlton Haws, [facility] as Pharmacist (Pharmacist)  Patient lives at home alone, she is widowed.  Recent office visits: 03/14/21 Dr Glori Bickers VV: f/u HTN. BP mostly at goal at home w/ some spikes. Continue amlodipine 5 mg, HCTZ 25 mg.  11/17/20-PCP-Marne Tower,MD-Patient presented for follow up hypertension and dizziness.Try reducing amlodipine to 2.39m (5 mg caused hypotension).Consider weaning amitriptyline. D/C hydroxyzine and tramadol (ineffective/not taking). Follow up 3 months.  09/21/20-PCP-Marne Tower,MD-Patient presented for follow up hypertension-Disc trial to hold amlodipine 5 mg due to  hypotension; instructed to get a new BP cuff.  09/09/20-Family Medicine-Jessica Cody,MD-Patient presented for a rash on her body.Urgent care diagnosed shingles. Trial of triamcinolone cream 0.1%.apply topically twice daily.  08/25/20-PCP-Marne Tower,MD-Patient presented for AWV.Discussed screenings and vaccines(pneumonia shot given)Reviewed previous labs(A1C 6.7) referral made for DM teaching. Check with GI for nausea.Increase D3 to 4000units daily,follow up 4 months   Recent consult visits: 02/24/21 PA Lauren McDonough (Pulmonary): f/u OSA, SOB. Spirometry normal.  02/17/21 Dr OCorky Sox(Cardiology): f/u pulmonary HTN. No med changes. 02/01/21 Dr PLarkin Ina(neurology): f/u neuropathy; concern for polypharmacy, oversedation; continue Lyrica 150 mg AM, 300 mg HS; Reduce ropinirole 1 mg to 0.75 mg AM; 01/19/21 positive covid test. Sx resolved within 5 days. 12/16/20-Orthopedic Surgery-Michael Menz,MD-no data found 11/24/20-Cardiology-Kenneth Fath,MD-Patient presented for follow up hypertension. Increase amlodipine to 5 mg daily.  11/04/20-Podiatry-Kevin Patel DPM-Patient presented for left ingrown toenail,no medication changes 11/03/20-Dermatology-David Kowalski,MD-Patient presented for annual exam-- Recommend daily use of broad spectrum spf 30+ sunscreen to sun-exposed areas.  10/06/20-Orthopedic Surgery-Michael Menz,MD-Patient presented for H&P leading up to surgery. 09/05/20-[facility] [place]-David Tomczak,PA-Patient presented for shingles. Start valacyclovir 1g. Take 1 tablet 3 times daily for 7 days. 08/19/20-Podiatry-Kevin Patel,DPM-Patient presented for follow up ingrown toenail,no medication changes  08/09/20-Internal Medicine-Saddat Khan,MD-Patient presented for follow up shortness of breath,discussed tests,discontinued zofran,bentyl. 08/03/20-Podiatry-Kevin Patel,DPM-Patient presented for ingrown toenail.Removal today,no medication changes.   Hospital visits: 10/21/20-[facility] ED- Patient presented for  hypertensive episode after having knee replacement last week. Patient left on her own without being seen.  10/12/20 thru 10/14/20-[facility]-Patient presented for Left total knee replacement.Patient given perioperative antibiotics, lovenox 344mq 12 hours.She was given sequential compression devices, early ambulation, and Lovenox TEDs for DVT prophylaxis.  Objective:  Lab Results  Component Value Date   CREATININE 0.71 10/21/2020   BUN 14 10/21/2020   GFR 45.99 (L) 08/10/2020   GFRNONAA >60 10/21/2020   GFRAA >60 06/07/2019   NA 130 (L) 10/21/2020   K 3.6 10/21/2020   CALCIUM 9.3 10/21/2020   CO2 25 10/21/2020   GLUCOSE 112 (H) 10/21/2020    Lab Results  Component Value Date/Time   HGBA1C 6.2 (A) 11/17/2020 09:50 AM   HGBA1C 6.7 (H) 08/10/2020 08:17 AM   HGBA1C 6.4 03/12/2020 10:47 AM   GFR 45.99 (L) 08/10/2020 08:17 AM   GFR 80.10 02/13/2020 11:55 AM    Last diabetic Eye exam:  Lab Results  Component Value Date/Time   HMDIABEYEEXA No Retinopathy 11/04/2020 12:00 AM    Last diabetic Foot exam: No results found for: HMDIABFOOTEX   Lab Results  Component Value Date   CHOL 163 08/10/2020   HDL 36.20 (L) 08/10/2020   LDLCALC 67 03/12/2020   LDLDIRECT 90.0 08/10/2020   TRIG 368.0 (H) 08/10/2020   CHOLHDL 5 08/10/2020    Hepatic Function Latest Ref Rng & Units 10/01/2020 08/10/2020 02/13/2020  Total Protein 6.5 - 8.1 g/dL 7.2 6.8 7.4  Albumin 3.5 - 5.0 g/dL 4.4 4.4 3.9  AST 15 - 41 U/L _0 ALT 0 - 44 U/L _1 Alk Phosphatase 38 - 126 U/L 67 73 120(H)  Total Bilirubin 0.3 - 1.2 mg/dL 0.4 0.2 0.2    Lab Results  Component Value Date/Time   TSH 1.51 08/10/2020 08:17 AM    CBC Latest Ref Rng & Units 10/21/2020 10/13/2020 10/12/2020  WBC 4.0 - 10.5 K/uL 10.4 10.7(H) 9.4  Hemoglobin 12.0 - 15.0 g/dL 11.8(L) 10.4(L) 11.4(L)  Hematocrit 36.0 - 46.0 % 35.1(L) 31.6(L) 35.0(L)  Platelets 150 - 400 K/uL 567(H) 261 269   Iron/TIBC/Ferritin/ %Sat    Component Value  Date/Time   IRON 58 10/21/2019 1351   TIBC 346 10/21/2019 1351   FERRITIN 66.9 02/13/2020 1155   FERRITIN 24 10/21/2019 1351   IRONPCTSAT 17 10/21/2019 1351    Lab Results  Component Value Date/Time   VD25OH 31.20 08/10/2020 08:17 AM   VD25OH 33.46 04/21/2019 11:27 AM    Clinical ASCVD: No  The 10-year ASCVD risk score (Arnett DK, et al., 2019) is: 53.5%   Values used to calculate the score:     Age: 66 years     Sex: Female     Is Non-Hispanic African American: No     Diabetic: Yes     Tobacco smoker: No     Systolic Blood Pressure: 263 mmHg     Is BP treated: Yes     HDL Cholesterol: 36.2 mg/dL     Total Cholesterol: 163 mg/dL    Depression screen [facility] 2/9 09/10/2020 08/25/2020 04/08/2020  Decreased Interest 0 0 0  Down, Depressed, Hopeless 0 0 0  PHQ - 2 Score 0 0 0  Altered sleeping - - -  Tired, decreased energy - - -  Change in appetite - - -  Feeling bad or failure about yourself  - - -  Trouble concentrating - - -  Moving slowly or fidgety/restless - - -  Suicidal thoughts - - -  PHQ-9 Score - - -  Difficult doing work/chores - - -     Social History   Tobacco Use  Smoking Status Never  Smokeless Tobacco Never   BP Readings from Last 3 Encounters:  03/14/21 (!) 160/84  02/24/21 (!) 150/81  02/01/21 136/84   Pulse Readings from Last 3 Encounters:  03/14/21 83  02/24/21 82  02/01/21 76   Wt Readings from Last 3 Encounters:  03/14/21 172 lb (78 kg)  02/24/21 172 lb (78 kg)  11/17/20 163 lb 6 oz (74.1 kg)   BMI Readings from Last 3 Encounters:  03/14/21 35.95 kg/m  02/24/21 35.95 kg/m  11/17/20 33.85 kg/m    Assessment/Interventions: Review of patient past medical history, allergies, medications, health status, including review of consultants reports, laboratory and other test data, was performed as part of comprehensive evaluation and provision of chronic care management services.   SDOH:  (Social Determinants of Health) assessments and  interventions performed: Yes   SDOH Screenings   Alcohol Screen: Not on file  Depression (PHQ2-9): Low Risk    PHQ-2 Score: 0  Financial Resource Strain: Low Risk    Difficulty of Paying Living Expenses: Not very hard  Food Insecurity: Not on file  Housing: Not on file  Physical Activity: Not on file  Social Connections: Not on file  Stress: Not on file  Tobacco Use: Low Risk    Smoking Tobacco Use: Never   Smokeless Tobacco Use: Never   Passive Exposure: Not on file  Transportation Needs: Not on file    [place]  Allergies  Allergen Reactions   Bee Venom Anaphylaxis and Hives   Fosamax [Alendronate] Nausea Only   Ciprofloxacin Hcl Rash    Mild rash after long term use   Sulfa Antibiotics Rash    Bactrim    Medications Reviewed Today     Reviewed by Charlton Haws, [facility] (Pharmacist) on 05/05/21 at 1150  Med List Status: <None>   Medication Order Taking? Sig Documenting Provider Last Dose Status Informant  albuterol (VENTOLIN HFA) 108 (90 Base) MCG/ACT inhaler [id number] Yes Inhale 2 puffs into the lungs every 6 (six) hours as needed for wheezing or shortness of breath. McDonough, Si Gaul, PA-C Taking Active   amitriptyline (ELAVIL) 25 MG tablet [id number] Yes Take 1 tablet (25 mg total) by mouth at bedtime. Tower, Wynelle Fanny, MD Taking Active Self  amLODipine (NORVASC) 5 MG tablet [id number] Yes Take 1 tablet (5 mg total) by mouth daily. Tower, Wynelle Fanny, MD Taking Active   aspirin 81 MG EC tablet [id number] Yes Take 81 mg by mouth at bedtime.  [provider] Taking Active Self  cephALEXin (KEFLEX) 500 MG capsule [id number] Yes Take 500 mg by mouth. PRN dentist appts for 1 year following knee replacement [provider] Taking Active   Cholecalciferol (VITAMIN D3) 50 MCG (2000 UT) TABS [id number] Yes Take 5,000 Units by mouth daily. [provider] Taking Active Self  Cyanocobalamin (VITAMIN B-12) 1000 MCG SUBL [id number] Yes Take 2,000 mcg by  mouth daily. [provider] Taking Active Self  EPINEPHrine 0.3 mg/0.3 mL IJ SOAJ injection [id number] Yes Inject 0.3 mg into the muscle as needed for anaphylaxis. [provider] Taking Active   hydrochlorothiazide (HYDRODIURIL) 25 MG tablet [id number] Yes TAKE 1 TABLET DAILY Tower, Wynelle Fanny, MD Taking Active   meloxicam (MOBIC) 15 MG tablet [id number] Yes Take 15 mg by mouth daily. [provider] Taking Active   omeprazole (PRILOSEC) 40 MG capsule [id number] Yes Take 40 mg by mouth every other day. [provider] Taking Active Self  polyethylene glycol (MIRALAX / GLYCOLAX) 17 g packet [id number] Yes Take 1 packet by mouth daily.  [provider] Taking Active Self  pregabalin (LYRICA)  150 MG capsule [id number] Yes Take 1 capsule (150 mg total) by mouth 2 (two) times daily.  Patient taking differently: Take 150 mg by mouth See admin instructions. Take 150 mg by mouth in the morning and  300 mg in the afternoon   Elby Beck, FNP Taking Active Self  rOPINIRole (REQUIP) 0.25 MG tablet [id number] Yes Take 0.75 mg by mouth in the morning and at bedtime. One in am and one in afternoon [provider] Taking Active   rOPINIRole (REQUIP) 4 MG tablet [id number] Yes TAKE 1 TABLET AT BEDTIME Tower, Wynelle Fanny, MD Taking Active   simvastatin (ZOCOR) 20 MG tablet [id number] Yes Take 1 tablet (20 mg total) by mouth at bedtime. Tower, Wynelle Fanny, MD Taking Active Self            Patient Active Problem List   Diagnosis Date Noted   Hyperlipidemia 11/17/2020   S/P TKR (total knee replacement) using cement, left 10/12/2020   Somnolence, daytime 09/09/2020   Rash 09/09/2020   Medicare annual wellness visit, subsequent 08/25/2020   Encounter for screening mammogram for breast cancer 08/25/2020   Encounter for general adult medical examination with abnormal findings 08/25/2020   GERD (gastroesophageal reflux disease) 08/25/2020   Dizzy spells 08/25/2020    Migraine 03/12/2020   IBS (irritable bowel syndrome) 03/12/2020   S/P TKR (total knee replacement) using cement, right 01/01/2020   History of vaginal hysterectomy 06/11/2019   Estrogen deficiency 04/22/2019   History of skin cancer 04/22/2019   High-tone pelvic floor dysfunction 04/03/2019   IC (interstitial cystitis) 04/03/2019   Neuralgia of both pudendal nerves 04/03/2019   Pelvic pain in female 04/03/2019   Osteoporosis 02/12/2019   Asthma 01/31/2019   Adverse reaction to influenza vaccine, initial encounter 12/03/2018   Vitamin D deficiency 12/03/2018   Restless legs syndrome 11/28/2018   Small fiber neuropathy 11/28/2018   Allergic rhinitis 10/08/2018   Benign essential hypertension 10/08/2018   Chronic bladder pain 10/08/2018   Neuropathy 10/08/2018   Osteoarthritis 10/08/2018   S/P BSO (bilateral salpingo-oophorectomy) 01/07/2018   OSA on CPAP 12/29/2016   Other and unspecified disc disorder of lumbar region 10/25/2000   Prediabetes 09/24/1998   Tinnitus of both ears 09/24/1983    Immunization History  Administered Date(s) Administered   Fluad Quad(high Dose 65+) 12/03/2018   Influenza Split 02/22/2001   Influenza,inj,Quad PF,6+ Mos 03/12/2020, 11/17/2020   Influenza-Unspecified 11/27/2017   PFIZER(Purple Top)SARS-COV-2 Vaccination 03/06/2019, 03/29/2019, 10/31/2019   PNEUMOCOCCAL CONJUGATE-20 08/25/2020   PPD Test 02/27/2014   Pneumococcal Polysaccharide-23 10/26/1999   Td 11/01/2001   Tdap 11/01/2001    Conditions to be addressed/monitored:  Hypertension, Hyperlipidemia, GERD, Osteoporosis, and Osteoarthritis, Interstitial cystitis  Care Plan : [place]  Updates made by Charlton Haws, [place] since 05/05/2021 12:00 AM     Problem: Hypertension, Hyperlipidemia, GERD, Osteoporosis, and Osteoarthritis, Interstitial cystitis   Priority: High     Long-Range Goal: Disease mgmt   Start Date: 02/09/2021  Expected End Date: 05/06/2022  This  Visit's Progress: On track  Recent Progress: On track  Priority: High  Note:   Current Barriers:  Unable to independently monitor therapeutic efficacy  Pharmacist Clinical Goal(s):  Patient will achieve adherence to monitoring guidelines and medication adherence to achieve therapeutic efficacy through collaboration with PharmD and provider.   Interventions: 1:1 collaboration with Tower, Wynelle Fanny, MD regarding development and update of comprehensive plan of care as evidenced by provider attestation and co-signature Inter-disciplinary care team collaboration (see  longitudinal plan of care) Comprehensive medication review performed; medication list updated in electronic medical record  Hypertension (BP goal <140/90) -Controlled - pt endorses compliance with medication as below; she reports checking BP periodically and it has been at goal -Hx OSA; on CPAP, falls asleep during day often. -Current home readings: at goal per patient -Current treatment: Amlodipine 5 mg daily - Appropriate, Effective, Safe, Accessible HCTZ 25 mg daily - Appropriate, Effective, Safe, Accessible -Denies hypotensive/hypertensive symptoms -Educated on BP goals and benefits of medications for prevention of heart attack, stroke and kidney damage; Daily salt intake goal < 2300 mg; Importance of home blood pressure monitoring; -Counseled to monitor BP at home periodically -Recommended to continue current medication  Hyperlipidemia: (LDL goal < 100) -Controlled - LDL is at goal but did worsen from 67 to 90 between Jan and June 2022; pt endorses compliance with statin; she endorses dietary indiscretions (sweets) and limited movement/exercise due to knee pain  -Hx TIA, resolved w/in 2 hours -Current treatment: Simvastatin 20 mg daily HS - Appropriate, Effective, Safe, Accessible Aspirin 81 mg daily HS -Appropriate, Effective, Safe, Accessible -Educated on Cholesterol goals; Benefits of statin for ASCVD risk reduction;  Importance of limiting foods high in cholesterol; Exercise goal of 150 minutes per week; -Recommended to continue current medication  Asthma (Goal: control symptoms and prevent exacerbations) -Controlled - uses albuterol daily before AM walk;  -Current treatment  Albuterol HFA prn -Appropriate, Effective, Safe, Accessible -Pulmonary function testing: not on file -Recommended to continue current medication  Osteopenia (Goal prevent fractures) -Not ideally controlled - pt was unable to tolerate alendronate and Prolia was too expensive in 2021; -Last DEXA Scan: 01/21/19   T-Score femoral neck: -1.7  T-Score forearm radius: -1.9  10-year probability of major osteoporotic fracture: 30.2%  10-year probability of hip fracture: 16.3% -Patient is a candidate for pharmacologic treatment due to T-Score -1.0 to -2.5 and 10-year risk of major osteoporotic fracture > 20% and T-Score -1.0 to -2.5 and 10-year risk of hip fracture > 3% -Current treatment  Vitamin D 5000 IU daily -Appropriate, Effective, Safe, Accessible -Medications previously tried: alendronate (2021-2022, nausea); Prolia was not covered in 2021, early 2022 was checking on coverage again, unclear results -Recommend 1200 mg of calcium daily from dietary and supplemental sources. Recommend weight-bearing and muscle strengthening exercises for building and maintaining bone density.  Chronic pain (Goal: manage pain) -Controlled - pt reports she has fibromyalgia; current regimen works reasonably well but pt endorses falling asleep during various activities during the day (eating, playing cards, at church), she thinks this is due to poor sleep at night and not due to her medications -Hx osteoarthritis (s/p TKR 09/2020), lumbar disc disorder, neuropathy; she is now following with physical medicine -Current treatment  Pregabalin 150 mg - 1 tab AM, 2 tab PM -Appropriate, Effective, Safe, Accessible Amitriptyline 25 mg HS (interstitial cystitis)  -Appropriate, Effective, Safe, Accessible Meloxicam 15 mg daily Turmeric 1000 mg BID -Counseled on additive sedative effects of pregabalin, amitriptyline and ropinirole - these are likely contributing to sedation in the daytime  RLS (Goal: manage symptoms) -Controlled - pt is concerned about upcoming dose reduction in ropinirole to 0.75 mg during the day; she has not picked up new rx yet -Current treatment  Ropinirole 0.75 mg - AM, lunch (not started yet - still has 1 mg) Ropinirole 4 mg - HS -Discussed role of ropinirole in oversedation (see above); advised lower dose of ropinirole may not change efficacy so much and may improve daytime sedation -  Recommended to continue current medication  GERD (Goal: manage symptoms) -Controlled - pt reports reflux symptoms worsen when she goes >2 days without omeprazole; she has not tried H2RAs -Current treatment  Omeprazole 40 mg daily - taking every other day -Medications previously tried: Dexilant  -Counseled on long term PPI side effects including reduced bone density;  -Recommended trial off PPI and trial of famotidine 20 mg daily instead  Interstitial cystitis (Goal: manage symptoms) -Controlled - pt has tried time off amitriptyline and sleep was negatively effected; she is not sure if it is helping or not with cystitis -Current treatment  Amitriptyline 25 mg daily HS - Appropriate, Query Effective,  -Medications previously tried: Elmiron, hydroxyzine -Recommend to continue current medication  Poor sleep (Goal: improve sleep) -Not ideally controlled -Pt had sleep study 3 weeks ago that was "awful" showing poor sleep and heightened arousal, she is having repeat sleep study this weekend -gets up 4x per night to urinate; wears CPAP; sometimes she falls asleep doing tasks during the day (eating a hamburger, at church, playing cards with friends) -Current treatment  Amitriptyline 25 mg HS - Appropriate, Effective, Safe, Accessible -Discussed  falling asleep during daily tasks is likely due to polypharmacy/multiple sedating meds along with poor sleep at night -Recommend to continue current medication; follow up with sleep medicine as scheduled  Patient Goals/Self-Care Activities Patient will:  - take medications as prescribed as evidenced by patient report and record review focus on medication adherence by pill box -No med changes       Medication Assistance: None required.  Patient affirms current coverage meets needs.  Compliance/Adherence/Medication fill history: Care Gaps: Urine microalbumin Hep C screening  Star-Rating Drugs: Simvastatin -PDC 100%  Patient's preferred pharmacy is:  [id number]Phone: [phone]: [phone] [facility] [address]Nob[address] MSopchoppyNSav[address] StreetSOttoville[address]Phone: [phone]: [phone]  Uses pill box? Yes Pt endorses 100% compliance  We discussed: Current pharmacy is preferred with insurance plan and patient is satisfied with pharmacy services Patient decided to: Continue current medication management strategy  Care Plan and Follow Up Patient Decision:  Patient agrees to Care Plan and Follow-up.  Plan: Telephone follow up appointment with care management team member scheduled for:  1 year  LCharlene Brooke PharmD, BEncompass Health Rehabilitation Hospital Of AbileneClinical Pharmacist LAlbemarlePrimary Care at SCarolinas Rehabilitation - [phone]

## 2021-05-05 NOTE — Patient Instructions (Signed)
Visit Information ? ?Phone number for Pharmacist: [phone] ? ? Goals Addressed   ?None ?  ? ? ?Care Plan : [place]  ?Updates made by Charlton Haws, [place] since 05/05/2021 12:00 AM  ?  ? ?Problem: Hypertension, Hyperlipidemia, GERD, Osteoporosis, and Osteoarthritis, Interstitial cystitis   ?Priority: High  ?  ? ?Long-Range Goal: Disease mgmt   ?Start Date: 02/09/2021  ?Expected End Date: 05/06/2022  ?This Visit's Progress: On track  ?Recent Progress: On track  ?Priority: High  ?Note:   ?Current Barriers:  ?Unable to independently monitor therapeutic efficacy ? ?Pharmacist Clinical Goal(s):  ?Patient will achieve adherence to monitoring guidelines and medication adherence to achieve therapeutic efficacy through collaboration with PharmD and provider.  ? ?Interventions: ?1:1 collaboration with Tower, Wynelle Fanny, MD regarding development and update of comprehensive plan of care as evidenced by provider attestation and co-signature ?Inter-disciplinary care team collaboration (see longitudinal plan of care) ?Comprehensive medication review performed; medication list updated in electronic medical record ? ?Hypertension (BP goal <140/90) ?-Controlled - pt endorses compliance with medication as below; she reports checking BP periodically and it has been at goal ?-Hx OSA; on CPAP, falls asleep during day often. ?-Current home readings: at goal per patient ?-Current treatment: ?Amlodipine 5 mg daily - Appropriate, Effective, Safe, Accessible ?HCTZ 25 mg daily - Appropriate, Effective, Safe, Accessible ?-Denies hypotensive/hypertensive symptoms ?-Educated on BP goals and benefits of medications for prevention of heart attack, stroke and kidney damage; Daily salt intake goal < 2300 mg; Importance of home blood pressure monitoring; ?-Counseled to monitor BP at home periodically ?-Recommended to continue current medication ? ?Hyperlipidemia: (LDL goal < 100) ?-Controlled - LDL is at goal but did worsen from 67 to  90 between Jan and June 2022; pt endorses compliance with statin; she endorses dietary indiscretions (sweets) and limited movement/exercise due to knee pain  ?-Hx TIA, resolved w/in 2 hours ?-Current treatment: ?Simvastatin 20 mg daily HS - Appropriate, Effective, Safe, Accessible ?Aspirin 81 mg daily HS -Appropriate, Effective, Safe, Accessible ?-Educated on Cholesterol goals; Benefits of statin for ASCVD risk reduction; Importance of limiting foods high in cholesterol; Exercise goal of 150 minutes per week; ?-Recommended to continue current medication ? ?Asthma (Goal: control symptoms and prevent exacerbations) ?-Controlled - uses albuterol daily before AM walk;  ?-Current treatment  ?Albuterol HFA prn -Appropriate, Effective, Safe, Accessible ?-Pulmonary function testing: not on file ?-Recommended to continue current medication ? ?Osteopenia (Goal prevent fractures) ?-Not ideally controlled - pt was unable to tolerate alendronate and Prolia was too expensive in 2021; ?-Last DEXA Scan: 01/21/19  ? T-Score femoral neck: -1.7 ? T-Score forearm radius: -1.9 ? 10-year probability of major osteoporotic fracture: 30.2% ? 10-year probability of hip fracture: 16.3% ?-Patient is a candidate for pharmacologic treatment due to T-Score -1.0 to -2.5 and 10-year risk of major osteoporotic fracture > 20% and T-Score -1.0 to -2.5 and 10-year risk of hip fracture > 3% ?-Current treatment  ?Vitamin D 5000 IU daily -Appropriate, Effective, Safe, Accessible ?-Medications previously tried: alendronate (2021-2022, nausea); Prolia was not covered in 2021, early 2022 was checking on coverage again, unclear results ?-Recommend 1200 mg of calcium daily from dietary and supplemental sources. Recommend weight-bearing and muscle strengthening exercises for building and maintaining bone density. ? ?Chronic pain (Goal: manage pain) ?-Controlled - pt reports she has fibromyalgia; current regimen works reasonably well but pt endorses falling  asleep during various activities during the day (eating, playing cards, at church), she thinks this is due to poor  sleep at night and not due to her medications ?-Hx osteoarthritis (s/p TKR 09/2020), lumbar disc disorder, neuropathy; she is now following with physical medicine ?-Current treatment  ?Pregabalin 150 mg - 1 tab AM, 2 tab PM -Appropriate, Effective, Safe, Accessible ?Amitriptyline 25 mg HS (interstitial cystitis) -Appropriate, Effective, Safe, Accessible ?Meloxicam 15 mg daily ?Turmeric 1000 mg BID ?-Counseled on additive sedative effects of pregabalin, amitriptyline and ropinirole - these are likely contributing to sedation in the daytime ? ?RLS (Goal: manage symptoms) ?-Controlled - pt is concerned about upcoming dose reduction in ropinirole to 0.75 mg during the day; she has not picked up new rx yet ?-Current treatment  ?Ropinirole 0.75 mg - AM, lunch (not started yet - still has 1 mg) ?Ropinirole 4 mg - HS ?-Discussed role of ropinirole in oversedation (see above); advised lower dose of ropinirole may not change efficacy so much and may improve daytime sedation ?-Recommended to continue current medication ? ?GERD (Goal: manage symptoms) ?-Controlled - pt reports reflux symptoms worsen when she goes >2 days without omeprazole; she has not tried H2RAs ?-Current treatment  ?Omeprazole 40 mg daily - taking every other day ?-Medications previously tried: Dexilant  ?-Counseled on long term PPI side effects including reduced bone density;  ?-Recommended trial off PPI and trial of famotidine 20 mg daily instead ? ?Interstitial cystitis (Goal: manage symptoms) ?-Controlled - pt has tried time off amitriptyline and sleep was negatively effected; she is not sure if it is helping or not with cystitis ?-Current treatment  ?Amitriptyline 25 mg daily HS - Appropriate, Query Effective,  ?-Medications previously tried: Elmiron, hydroxyzine ?-Recommend to continue current medication ? ?Poor sleep (Goal: improve  sleep) ?-Not ideally controlled ?-Pt had sleep study 3 weeks ago that was "awful" showing poor sleep and heightened arousal, she is having repeat sleep study this weekend ?-gets up 4x per night to urinate; wears CPAP; sometimes she falls asleep doing tasks during the day (eating a hamburger, at church, playing cards with friends) ?-Current treatment  ?Amitriptyline 25 mg HS - Appropriate, Effective, Safe, Accessible ?-Discussed falling asleep during daily tasks is likely due to polypharmacy/multiple sedating meds along with poor sleep at night ?-Recommend to continue current medication; follow up with sleep medicine as scheduled ? ?Patient Goals/Self-Care Activities ?Patient will:  ?- take medications as prescribed as evidenced by patient report and record review ?focus on medication adherence by pill box ?-No med changes ?  ?  ? ?Patient verbalizes understanding of instructions and care plan provided today and agrees to view in [facility]. Active MyChart status confirmed with patient.   ?Telephone follow up appointment with pharmacy team member scheduled for: 1 year ? ?Charlene Brooke, PharmD, BCACP ?Clinical Pharmacist ?[facility] ?[phone] ?  ?

## 2021-05-06 ENCOUNTER — Encounter (INDEPENDENT_AMBULATORY_CARE_PROVIDER_SITE_OTHER): Payer: Medicare Other | Admitting: Internal Medicine

## 2021-05-06 DIAGNOSIS — G4719 Other hypersomnia: Secondary | ICD-10-CM

## 2021-05-08 NOTE — Procedures (Signed)
[facility] ?[place] ?[place], [address] ? ? ? ?Complete Pulmonary Function Testing Interpretation: ? ?FINDINGS: ? ?The forced vital capacity is normal FEV1 was normal.  FEV1 FVC ratio was mildly decreased.  Postbronchodilator no significant change in FEV1 clinical improvement may still occur in the absence of spirometric improvement.  Total lung capacity is normal.  Residual volume is normal.  Residual volume total lung capacity ratio is increased.  FRC is mildly decreased.  DLCO was normal ? ?IMPRESSION: ? ?This pulmonary function study is within normal limits clinical correlation is recommended ? ?Allyne Gee, MD FCCP ?Pulmonary Critical Care Medicine ?Sleep Medicine ? ?

## 2021-05-10 ENCOUNTER — Ambulatory Visit
Admission: RE | Admit: 2021-05-10 | Discharge: 2021-05-10 | Disposition: A | Discharge status: Home / Self Care | Payer: Medicare Other | Source: Ambulatory Visit | Attending: Physical Medicine & Rehabilitation | Admitting: Physical Medicine & Rehabilitation

## 2021-05-10 DIAGNOSIS — G8929 Other chronic pain: Secondary | ICD-10-CM | POA: Insufficient documentation

## 2021-05-10 DIAGNOSIS — G629 Polyneuropathy, unspecified: Secondary | ICD-10-CM | POA: Diagnosis not present

## 2021-05-10 DIAGNOSIS — R4189 Other symptoms and signs involving cognitive functions and awareness: Secondary | ICD-10-CM | POA: Diagnosis not present

## 2021-05-10 DIAGNOSIS — M5441 Lumbago with sciatica, right side: Secondary | ICD-10-CM | POA: Diagnosis not present

## 2021-05-10 DIAGNOSIS — M5442 Lumbago with sciatica, left side: Secondary | ICD-10-CM | POA: Insufficient documentation

## 2021-05-10 DIAGNOSIS — G4733 Obstructive sleep apnea (adult) (pediatric): Secondary | ICD-10-CM | POA: Diagnosis not present

## 2021-05-10 DIAGNOSIS — M5136 Other intervertebral disc degeneration, lumbar region: Secondary | ICD-10-CM | POA: Diagnosis not present

## 2021-05-10 DIAGNOSIS — G2581 Restless legs syndrome: Secondary | ICD-10-CM | POA: Diagnosis not present

## 2021-05-11 ENCOUNTER — Other Ambulatory Visit: Payer: Self-pay | Admitting: Family Medicine

## 2021-05-11 NOTE — Procedures (Signed)
? ?Erin Petersen  ?Polysomnogram Report ?Part I ? ? ?   ?                                                           Phone: [phone] ?Fax: [phone] ? ?Patient Name: Erin Petersen, Erin Petersen Acquisition Number: [id number]  ?Date of Birth: 12/24/1940 Acquisition Date: 05/06/2021  ?Referring Physician: Drema Dallas PA-C    ? ?History: The patient is a 81 year old female who was referred for reevaluation of obstructive sleep apnea. Medical History: arthritis, asthma, bacteremia, diabetes, duodenal ulcer, dyspnea, GERD, hyperlipidemia, hypertension, neuropathy, RLS ? ?Medications: Tylenol, Elavil, Norvasc, aspirin, Keflex, vitamin D3, B12, eplnephrine, Flonase, hydrochlorothiazide, melatonin, Mobic, Prilosec, Zofran, Miralax, Lyrica, Requip, Zocor, tumeric and albuterol. ? ?Procedure: This routine overnight polysomnogram was performed on the [facility] 5 using the standard diagnostic protocol. This included 6 channels of EEG, 2 channels of EOG, chin EMG, bilateral anterior tibialis EMG, nasal/oral thermistor, PTAF (nasal pressure transducer), chest and abdominal wall movements, EKG, and pulse oximetry. ? ?Description: The total recording time was 408.0 minutes. The total sleep time was 377.0 minutes. There were a total of 29.5 minutes of wakefulness after sleep onset for a goodsleep efficiency of 92.4%. The latency to sleep onset was shortat 1.5 minutes. The R sleep onset latency was within normal limits at 80.0 minutes. Sleep parameters, as a percentage of the total sleep time, demonstrated 2.1% of sleep was in N1 sleep, 71.2% N2, 0.0% N3 and 26.7% R sleep. There were a total of 26 arousals for an arousal index of 4.1 arousals per hour of sleep that was normal. ? ?Respiratory monitoring demonstrated occasional moderate to severe degree of snoring. Less than 7 minutes of supine sleep were observed. There were 32 apneas and hypopneas for an Apnea Hypopnea Index of 5.1 apneas and hypopneas per hour of sleep. The REM  related apnea hypopnea index was 15.5/hr of REM sleep compared to a NREM AHI of 1.1/hr.  The average duration of the respiratory events was 20.4 seconds with a maximum duration of 56.0 seconds. The respiratory events were associated with peripheral oxygen desaturations on the average to 90%. The lowest oxygen desaturation associated with a respiratory event was 87%. Additionally, the baseline oxygen saturation during wakefulness was 95%, during NREM sleep averaged 95%, and during REM sleep averaged  94%. The total duration of oxygen < 90% was 1.3 minutes. ? ?Cardiac monitoring- did not demonstrate transient cardiac decelerations associated with the apneas. There were no significant cardiac rhythm irregularities.  ? ?Periodic limb movement monitoring- did not demonstrate periodic limb movements. Leg tremors were observed prior to sleep onset and persisted briefly into sleep. ? ? ? ?Impression: ?This routine overnight polysomnogram confirmed the presence of significant, REM-related obstructive sleep apnea with an overall Apnea Hypopnea Index of 5.1 apneas and hypopneas per hour of sleep, which increased to 15.5 in REM sleep. The lowest desaturation was to 87%.  The patient only stopped CPAP for a few days prior to testing which may have caused an underestimate of the severity of the sleep apnea. ? ? Leg tremors were observed prior to sleep onset and persisted briefly into sleep. Clinical correlation is suggested. ? ?There was a reduced percentage of slow wave sleep.This finding would appear to be due to the obstructive sleep apnea. ? ?  Recommendations:    ?A CPAP titration would be recommended for the sleep apnea. ?Would recommend weight loss in a patient with a BMI of 33.3.  ? ? ? ?Allyne Gee, MD, FCCP ?Diplomate ABMS-Pulmonary, Critical Care and Sleep Medicine  ?Electronically reviewed and digitally signed ? ? ?[place] ?Polysomnogram Report ?Part II ? ?Phone: [phone] ?Fax: [phone] ? ?Patient last name Erin Petersen Neck Size 15.0 in. Acquisition [phone]  ?Patient first name Erin Petersen Weight 165.0 lbs. Started 05/06/2021 at 10:28:58 PM  ?Birth date 1940/10/16 Height 59.0 in. Stopped 05/07/2021 at 5:26:52 AM  ?Age 33 BMI 33.3 lb/in2 Duration 408.0  ?Study Type Adult      ?Erin Petersen RPSGT / Erin Petersen  Reviewed by: Erin Petersen. Erin Glance, PhD, ABSM, FAASM ?Sleep Data: ?Lights Out: 10:35:58 PM Sleep Onset: 10:37:28 PM  ?Lights On: 5:23:58 AM Sleep Efficiency: 92.4 %  ?Total Recording Time: 408.0 min Sleep Latency (from Lights Off) 1.5 min  ?Total Sleep Time (TST): 377.0 min R Latency (from Sleep Onset): 80.0 min  ?Sleep Period Time: 406.5 min Total number of awakenings: 12  ?Wake during sleep: 29.5 min Wake After Sleep Onset (WASO): 29.5 min  ? ?Sleep Data:         Arousal Summary: ?Stage  ?Latency from lights out (min) Latency from sleep onset (min) Duration (min) % Total Sleep Time  ?Normal values  ?N 1 1.5 0.0 8.0 2.1 (5%)  ?N 2 2.5 1.0 268.5 71.2 (50%)  ?N 3       0.0 0.0 (20%)  ?R 81.5 80.0 100.5 26.7 (25%)  ? ? Number Index  ?Spontaneous 26 4.1  ?Apneas & Hypopneas 0 0.0  ?RERAs 0 0.0  ?     (Apneas & Hypopneas & RERAs)  (0) (0.0)  ?Limb Movement 0 0.0  ?Snore 0 0.0  ?TOTAL 26 4.1  ? ? ? ?Respiratory Data: ? CA OA MA Apnea Hypopnea* A+ H RERA Total  ?Number 0 12 0 12 20 32 0 32  ?Mean Dur (sec) 0.0 15.7 0.0 15.7 23.2 20.4 0.0 20.4  ?Max Dur (sec) 0.0 26.5 0.0 26.5 56.0 56.0 0.0 56.0  ?Total Dur (min) 0.0 3.1 0.0 3.1 7.7 10.9 0.0 10.9  ?% of TST 0.0 0.8 0.0 0.8 2.1 2.9 0.0 2.9  ?Index (#/h TST) 0.0 1.9 0.0 1.9 3.2 5.1 0.0 5.1  ?*Hypopneas scored based on 4% or greater desaturation. ? ?Sleep Stage:       ? REM NREM TST  ?AHI 15.5 1.1 5.1  ?RDI 15.5 1.1 5.1  ? ? ? ? ? ? ? ? ? ?Body Position Data: ? Sleep ?(min) TST ?(%) REM ?(min) NREM ?(min) CA ?(#) OA ?(#) MA ?(#) HYP ?(#) AHI ?(#/h) RERA ?(#) RDI ?(#/h) Desat ?(#)  ?Supine 6.4 1.70 0.0 6.4 0 0 0 0 0.0 0 0.00 0  ?Non-Supine 370.60 98.30  100.50 270.10 0.00 12.00 0.00 20.00 5.18 0 5.18 35.00  ?Left: 170.5 45.23 48.0 122.5 0 7 0 9 5.6 0 5.6 14  ?Right: 200.1 53.08 52.5 147.6 0 5 0 11 4.8 0 4.8 21  ?  ? ?Snoring: ?Total number of snoring episodes  0  ?Total time with snoring    min (   % of sleep)  ? ?Oximetry Distribution: ?            WK REM NREM TOTAL  ?Average (%)   95 94 95 95  ?< 90% 0.3 1.0 0.0 1.3  ?< 80% 0.2 0.0  0.0 0.2  ?< 70% 0.2 0.0 0.0 0.2  ?# of Desaturations* 1 27 7  35  ?Desat Index (#/hour) 2.9 16.1 1.5 5.6  ?Desat Max (%) 5 9 5 9   ?Desat Max Dur (sec) 63.0 78.0 94.0 94.0  ?Approx Min O2 during sleep 87  ?Approx min O2 during a respiratory event 87  ?Was Oxygen added (Y/N) and final rate No:   0 LPM  ?*Desaturations based on 4% or greater drop from baseline. ? ? Cheyne Stokes Breathing: None Present ? ?Heart Rate Summary:  ?Average Heart Rate During Sleep 62.9 bpm      ?Highest Heart Rate During Sleep (95th %) 66.0 bpm      ?Highest Heart Rate During Sleep 180 bpm (artifact)  ?Highest Heart Rate During Recording (TIB) 198 bpm (artifact)  ? ?Heart Rate Observations: ?Event Type # Events   ?Bradycardia 0 Lowest HR Scored: N/A  ?Sinus Tachycardia During Sleep 0 Highest HR Scored: N/A  ?Narrow Complex Tachycardia 0 Highest HR Scored: N/A  ?Wide Complex Tachycardia 0 Highest HR Scored: N/A  ?Asystole 0 Longest Pause: N/A  ?Atrial Fibrillation 0 Duration Longest Event: N/A  ?Other Arrythmias  No Type:   ? ?Periodic Limb Movement Data: (Primary legs unless otherwise noted) ?Total # Limb Movement 0 Limb Movement Index 0.0  ?Total # PLMS    PLMS Index     ?Total # PLMS Arousals    PLMS Arousal Index     ?Percentage Sleep Time with PLMS   min (   % sleep)  ?Mean Duration limb movements (secs)     ? ? ? ? ? ?

## 2021-05-13 ENCOUNTER — Encounter: Payer: Self-pay | Admitting: Internal Medicine

## 2021-05-13 LAB — PULMONARY FUNCTION TEST

## 2021-05-16 ENCOUNTER — Telehealth: Payer: Self-pay

## 2021-05-16 ENCOUNTER — Ambulatory Visit: Payer: Medicare Other | Admitting: Physician Assistant

## 2021-05-16 ENCOUNTER — Other Ambulatory Visit: Payer: Self-pay

## 2021-05-16 ENCOUNTER — Encounter: Payer: Self-pay | Admitting: Physician Assistant

## 2021-05-16 DIAGNOSIS — Z7189 Other specified counseling: Secondary | ICD-10-CM

## 2021-05-16 DIAGNOSIS — G2581 Restless legs syndrome: Secondary | ICD-10-CM

## 2021-05-16 DIAGNOSIS — G629 Polyneuropathy, unspecified: Secondary | ICD-10-CM | POA: Diagnosis not present

## 2021-05-16 DIAGNOSIS — G4733 Obstructive sleep apnea (adult) (pediatric): Secondary | ICD-10-CM

## 2021-05-16 DIAGNOSIS — J986 Disorders of diaphragm: Secondary | ICD-10-CM

## 2021-05-16 NOTE — Progress Notes (Signed)
[facility] ?[address] ?[place], [place] [address] ? ?Pulmonary Sleep Medicine  ? ?Office Visit Note ? ?Patient Name: Erin Petersen ?DOB: 05-Jan-1941 ?MRN [id number] ? ?Date of Service: 05/18/2021 ? ?Complaints/HPI: Pt is here for routine pulmonary follow up for PFT and PSG results. PFT was normal. PSG showed overall mild OSA with AHI of 5.1 after being off her machine over a week prior. Pt is already on APAP and would rather stay on APAP than have a new titration especially given very mild apneas indicating Osa is likely not the cause of her fatigue. Feels like she is having pain with deep breaths, both inhale and exhale.  Does mention she is due to follow up with GI regarding acid reflux and this could be what she is feeling rather than lung pain, especially given normal PFT and no other respiratory symptoms. Denies cough, SOB, or wheezing. Upon review of neurology notes, diaphragmatic testing was requested and therefore a sniff test will be ordered. ? ?ROS ? ?General: (-) fever, (-) chills, (-) night sweats, (-) weakness ?Skin: (-) rashes, (-) itching,. ?Eyes: (-) visual changes, (-) redness, (-) itching. ?Nose and Sinuses: (-) nasal stuffiness or itchiness, (-) postnasal drip, (-) nosebleeds, (-) sinus trouble. ?Mouth and Throat: (-) sore throat, (-) hoarseness. ?Neck: (-) swollen glands, (-) enlarged thyroid, (-) neck pain. ?Respiratory: - cough, (-) bloody sputum, - shortness of breath, - wheezing. ?Cardiovascular: - ankle swelling, (-) chest pain. ?Lymphatic: (-) lymph node enlargement. ?Neurologic: (-) numbness, (-) tingling. ?Psychiatric: (-) anxiety, (-) depression ? ? ?Current Medication: ?Outpatient Encounter Medications as of 05/16/2021  ?Medication Sig  ? albuterol (VENTOLIN HFA) 108 (90 Base) MCG/ACT inhaler Inhale 2 puffs into the lungs every 6 (six) hours as needed for wheezing or shortness of breath.  ? amitriptyline (ELAVIL) 25 MG tablet TAKE 1 TABLET AT BEDTIME  ? amLODipine  (NORVASC) 5 MG tablet Take 1 tablet (5 mg total) by mouth daily.  ? aspirin 81 MG EC tablet Take 81 mg by mouth at bedtime.   ? cephALEXin (KEFLEX) 500 MG capsule Take 500 mg by mouth. PRN dentist appts for 1 year following knee replacement  ? Cholecalciferol (VITAMIN D3) 50 MCG (2000 UT) TABS Take 5,000 Units by mouth daily.  ? Cyanocobalamin (VITAMIN B-12) 1000 MCG SUBL Take 2,000 mcg by mouth daily.  ? EPINEPHrine 0.3 mg/0.3 mL IJ SOAJ injection Inject 0.3 mg into the muscle as needed for anaphylaxis.  ? hydrochlorothiazide (HYDRODIURIL) 25 MG tablet TAKE 1 TABLET DAILY  ? meloxicam (MOBIC) 15 MG tablet Take 15 mg by mouth daily.  ? omeprazole (PRILOSEC) 40 MG capsule Take 40 mg by mouth every other day.  ? polyethylene glycol (MIRALAX / GLYCOLAX) 17 g packet Take 1 packet by mouth daily.   ? pregabalin (LYRICA) 150 MG capsule Take 1 capsule (150 mg total) by mouth 2 (two) times daily. (Patient taking differently: Take 150 mg by mouth See admin instructions. Take 150 mg by mouth in the morning and  300 mg in the afternoon)  ? rOPINIRole (REQUIP) 0.25 MG tablet Take 0.75 mg by mouth in the morning and at bedtime. One in am and one in afternoon  ? rOPINIRole (REQUIP) 4 MG tablet TAKE 1 TABLET AT BEDTIME  ? simvastatin (ZOCOR) 20 MG tablet Take 1 tablet (20 mg total) by mouth at bedtime.  ? ?No facility-administered encounter medications on file as of 05/16/2021.  ? ? ?Surgical History: ?Past Surgical History:  ?Procedure Laterality Date  ?  ABDOMINAL HYSTERECTOMY  1972  ? APPENDECTOMY  1958  ? BACK SURGERY  2002  ? lumbar fusion  ? BILATERAL SALPINGOOPHORECTOMY  01/07/2018  ? BIOPSY  04/15/2019  ? Procedure: BIOPSY;  Surgeon: [name], MD;  Location: [facility];  Service: Endoscopy;;  ? CARDIAC CATHETERIZATION  2019  ? COLONOSCOPY WITH PROPOFOL N/A 11/11/2018  ? Procedure: COLONOSCOPY WITH PROPOFOL;  Surgeon: [name], MD;  Location: [facility] ENDOSCOPY;  Service: Gastroenterology;   Laterality: N/A;  ? CYSTO WITH HYDRODISTENSION N/A 12/30/2018  ? Procedure: CYSTOSCOPY/HYDRODISTENSION WITH MARCAINE;  Surgeon: Bjorn Loser, MD;  Location: [facility];  Service: Urology;  Laterality: N/A;  ? ESOPHAGOGASTRODUODENOSCOPY (EGD) WITH PROPOFOL N/A 04/15/2019  ? Procedure: ESOPHAGOGASTRODUODENOSCOPY (EGD) WITH PROPOFOL;  Surgeon: [name], MD;  Location: [facility];  Service: Endoscopy;  Laterality: N/A;  sleep apnea  ? ESOPHAGOGASTRODUODENOSCOPY (EGD) WITH PROPOFOL N/A 07/09/2019  ? Procedure: ESOPHAGOGASTRODUODENOSCOPY (EGD) WITH PROPOFOL;  Surgeon: [name], MD;  Location: [facility] ENDOSCOPY;  Service: Gastroenterology;  Laterality: N/A;  ? EYE SURGERY Bilateral   ? HERNIA REPAIR  2018  ? abd  ? HERNIA REPAIR    ? umbilical  ? INGUINAL HERNIA REPAIR Bilateral   ? inguinal  ? JOINT REPLACEMENT    ? LAPAROSCOPIC BILATERAL SALPINGO OOPHERECTOMY  2019  ? SPINAL FUSION  2002  ? TONSILLECTOMY AND ADENOIDECTOMY  1961  ? TOTAL KNEE ARTHROPLASTY Right 01/01/2020  ? Procedure: Right total knee arthroplasty - [name] to Assist;  Surgeon: Hessie Knows, MD;  Location: [facility] OR[facility];  Service: Orthopedics;  Laterality: Right;  ? TOTAL KNEE ARTHROPLASTY Left 10/12/2020  ? Procedure: TOTAL KNEE ARTHROPLASTY;  Surgeon: Hessie Knows, MD;  Location: [facility];  Service: Orthopedics;  Laterality: Left;  ? TUBAL LIGATION    ? ? ?Medical History: ?Past Medical History:  ?Diagnosis Date  ? Arthritis   ? Asthma   ? exercise induced asthma  ? Bacteremia due to Gram-negative bacteria 06/04/2019  ? Colon polyps   ? Diabetes mellitus without complication ([place])   ? Duodenal ulcer   ? Dyspnea   ? GERD (gastroesophageal reflux disease)   ? Hyperlipidemia   ? Hypertension   ? Neuropathy   ? right leg and foot  ? Plantar fasciitis   ? Pre-diabetes   ? Prediabetes   ? RLQ abdominal pain   ? RLS (restless legs syndrome)   ? Sleep apnea   ? CPAP  ? SOB (shortness of breath)   ? Urinary incontinence   ?  Urinary tract infection   ? Wears hearing aid in right ear   ? ? ?Family History: ?Family History  ?Problem Relation Age of Onset  ? Alcohol abuse Mother   ? Cancer Mother   ? Stroke Mother   ? Alcohol abuse Father   ? Arthritis Sister   ? Arthritis Daughter   ? COPD Daughter   ? Arthritis Paternal Grandmother   ? Asthma Paternal Grandmother   ? COPD Paternal Grandmother   ? Breast cancer Paternal Aunt   ? ? ?Social History: ?Social History  ? ?Socioeconomic History  ? Marital status: Widowed  ?  Spouse name: Not on file  ? Number of children: 3  ? Years of education: Not on file  ? Highest education level: Bachelor's degree (e.g., BA, AB, BS)  ?Occupational History  ? Not on file  ?Tobacco Use  ? Smoking status: Never  ? Smokeless tobacco: Never  ?Vaping Use  ? Vaping Use:  Never used  ?Substance and Sexual Activity  ? Alcohol use: Not Currently  ? Drug use: Never  ? Sexual activity: Not Currently  ?Other Topics Concern  ? Not on file  ?Social History Narrative  ? Moved from [place]. 2020, lives near daughter. Retired. Enjoys walking.   ? ?Social Determinants of Health  ? ?Financial Resource Strain: Low Risk   ? Difficulty of Paying Living Expenses: Not very hard  ?Food Insecurity: Not on file  ?Transportation Needs: Not on file  ?Physical Activity: Not on file  ?Stress: Not on file  ?Social Connections: Not on file  ?Intimate Partner Violence: Not on file  ? ? ?Vital Signs: ?Blood pressure (!) 149/67, pulse 72, temperature 97.6 ?F (36.4 ?C), resp. rate 16, height 4' 10"  (1.473 m), weight 171 lb 6.4 oz (77.7 kg), SpO2 98 %. ? ?Examination: ?General Appearance: The patient is well-developed, well-nourished, and in no distress. ?Skin: Gross inspection of skin unremarkable. ?Head: normocephalic, no gross deformities. ?Eyes: no gross deformities noted. ?ENT: ears appear grossly normal no exudates. ?Neck: Supple. No thyromegaly. No LAD. ?Respiratory: Lungs clear to auscultation bilaterally. ?Cardiovascular: Normal S1 and S2  without murmur or rub. ?Extremities: No cyanosis. pulses are equal. ?Neurologic: Alert and oriented. No involuntary movements. ? ?LABS: ?Recent Results (from the past 2160 hour(s))  ?Pulmonary Function Test     Status

## 2021-05-16 NOTE — Telephone Encounter (Signed)
Lvm w/ [facility] specials department to schedule sniff test-Toni ?

## 2021-05-16 NOTE — Telephone Encounter (Signed)
Scheduled sniff test appointment. Notified patient of appointment date & time-Toni ?

## 2021-05-17 ENCOUNTER — Ambulatory Visit: Payer: Medicare Other

## 2021-05-17 ENCOUNTER — Telehealth: Payer: Self-pay

## 2021-05-17 NOTE — Telephone Encounter (Signed)
Patient was originally scheduled for CPAP titration on 06/07/21 @ [facility]. Patient called to cancel "stating that her dr said that she doesn't need 2nd study due to her already on an AutoPap. tat ?

## 2021-05-18 NOTE — Patient Instructions (Signed)

## 2021-05-19 ENCOUNTER — Other Ambulatory Visit: Payer: Self-pay

## 2021-05-19 ENCOUNTER — Ambulatory Visit
Admission: RE | Admit: 2021-05-19 | Discharge: 2021-05-19 | Disposition: A | Discharge status: Home / Self Care | Payer: Medicare Other | Source: Ambulatory Visit | Attending: Internal Medicine | Admitting: Internal Medicine

## 2021-05-19 DIAGNOSIS — J986 Disorders of diaphragm: Secondary | ICD-10-CM | POA: Insufficient documentation

## 2021-05-19 DIAGNOSIS — R0600 Dyspnea, unspecified: Secondary | ICD-10-CM | POA: Diagnosis not present

## 2021-05-23 NOTE — Telephone Encounter (Signed)
LMOM with results and advised to call back if any questions ?

## 2021-05-24 DIAGNOSIS — G8929 Other chronic pain: Secondary | ICD-10-CM | POA: Diagnosis not present

## 2021-05-24 DIAGNOSIS — M48062 Spinal stenosis, lumbar region with neurogenic claudication: Secondary | ICD-10-CM | POA: Diagnosis not present

## 2021-05-24 DIAGNOSIS — M5442 Lumbago with sciatica, left side: Secondary | ICD-10-CM | POA: Diagnosis not present

## 2021-05-24 DIAGNOSIS — M5441 Lumbago with sciatica, right side: Secondary | ICD-10-CM | POA: Diagnosis not present

## 2021-05-25 ENCOUNTER — Encounter: Payer: Self-pay | Admitting: Family Medicine

## 2021-05-27 DIAGNOSIS — J4520 Mild intermittent asthma, uncomplicated: Secondary | ICD-10-CM

## 2021-05-27 DIAGNOSIS — E7800 Pure hypercholesterolemia, unspecified: Secondary | ICD-10-CM | POA: Diagnosis not present

## 2021-05-27 DIAGNOSIS — M810 Age-related osteoporosis without current pathological fracture: Secondary | ICD-10-CM

## 2021-05-27 DIAGNOSIS — G4733 Obstructive sleep apnea (adult) (pediatric): Secondary | ICD-10-CM | POA: Diagnosis not present

## 2021-05-27 DIAGNOSIS — I10 Essential (primary) hypertension: Secondary | ICD-10-CM

## 2021-05-30 ENCOUNTER — Telehealth: Payer: Self-pay | Admitting: *Deleted

## 2021-05-30 DIAGNOSIS — E2839 Other primary ovarian failure: Secondary | ICD-10-CM

## 2021-05-30 NOTE — Telephone Encounter (Signed)
Pt sent a mychart message asking PCP to proceed with DEXA scan ?

## 2021-05-30 NOTE — Telephone Encounter (Signed)
The order is in for [facility]  ?Please give her the number to call and schedule  ?

## 2021-06-01 DIAGNOSIS — I272 Pulmonary hypertension, unspecified: Secondary | ICD-10-CM | POA: Diagnosis not present

## 2021-06-01 DIAGNOSIS — G4733 Obstructive sleep apnea (adult) (pediatric): Secondary | ICD-10-CM | POA: Diagnosis not present

## 2021-06-01 DIAGNOSIS — R0600 Dyspnea, unspecified: Secondary | ICD-10-CM | POA: Diagnosis not present

## 2021-06-01 DIAGNOSIS — I10 Essential (primary) hypertension: Secondary | ICD-10-CM | POA: Diagnosis not present

## 2021-06-02 DIAGNOSIS — M48062 Spinal stenosis, lumbar region with neurogenic claudication: Secondary | ICD-10-CM | POA: Diagnosis not present

## 2021-06-02 NOTE — Telephone Encounter (Signed)
Replied to the patient via mychart about this ?

## 2021-06-07 ENCOUNTER — Telehealth: Payer: Self-pay

## 2021-06-07 ENCOUNTER — Encounter: Payer: Self-pay | Admitting: Internal Medicine

## 2021-06-07 NOTE — Telephone Encounter (Signed)
Patient no longer needs 2nd study because she's on an auto-pap per [facility]. Erin Petersen has been notified.tat ?

## 2021-06-07 NOTE — Telephone Encounter (Signed)
Patient scheduled for cpap titration on ?

## 2021-06-14 DIAGNOSIS — G8929 Other chronic pain: Secondary | ICD-10-CM | POA: Diagnosis not present

## 2021-06-14 DIAGNOSIS — M5442 Lumbago with sciatica, left side: Secondary | ICD-10-CM | POA: Diagnosis not present

## 2021-06-14 DIAGNOSIS — M5441 Lumbago with sciatica, right side: Secondary | ICD-10-CM | POA: Diagnosis not present

## 2021-06-21 DIAGNOSIS — M5442 Lumbago with sciatica, left side: Secondary | ICD-10-CM | POA: Diagnosis not present

## 2021-06-21 DIAGNOSIS — M5441 Lumbago with sciatica, right side: Secondary | ICD-10-CM | POA: Diagnosis not present

## 2021-06-21 DIAGNOSIS — G8929 Other chronic pain: Secondary | ICD-10-CM | POA: Diagnosis not present

## 2021-06-22 DIAGNOSIS — M48062 Spinal stenosis, lumbar region with neurogenic claudication: Secondary | ICD-10-CM | POA: Diagnosis not present

## 2021-06-22 DIAGNOSIS — M5441 Lumbago with sciatica, right side: Secondary | ICD-10-CM | POA: Diagnosis not present

## 2021-06-22 DIAGNOSIS — G8929 Other chronic pain: Secondary | ICD-10-CM | POA: Diagnosis not present

## 2021-06-22 DIAGNOSIS — M5442 Lumbago with sciatica, left side: Secondary | ICD-10-CM | POA: Diagnosis not present

## 2021-07-11 DIAGNOSIS — M5441 Lumbago with sciatica, right side: Secondary | ICD-10-CM | POA: Diagnosis not present

## 2021-07-11 DIAGNOSIS — G8929 Other chronic pain: Secondary | ICD-10-CM | POA: Diagnosis not present

## 2021-07-11 DIAGNOSIS — M5442 Lumbago with sciatica, left side: Secondary | ICD-10-CM | POA: Diagnosis not present

## 2021-07-14 DIAGNOSIS — M5136 Other intervertebral disc degeneration, lumbar region: Secondary | ICD-10-CM | POA: Diagnosis not present

## 2021-07-14 DIAGNOSIS — M4316 Spondylolisthesis, lumbar region: Secondary | ICD-10-CM | POA: Diagnosis not present

## 2021-07-15 ENCOUNTER — Other Ambulatory Visit: Payer: Self-pay | Admitting: Family Medicine

## 2021-07-15 NOTE — Telephone Encounter (Signed)
On med as a historical entry but last time PCP filled med was on 03/12/20 #90 w/ 3 refills  Last OV was on 03/14/21 virtual visit for BP and BS

## 2021-07-15 NOTE — Telephone Encounter (Signed)
I refilled once   This can be hard on kidney and also increase bp a bit  Only use if she has to  Always with food  Schedule f/u please for visit/will do labs   Thanks

## 2021-07-18 DIAGNOSIS — G8929 Other chronic pain: Secondary | ICD-10-CM | POA: Diagnosis not present

## 2021-07-18 DIAGNOSIS — M5441 Lumbago with sciatica, right side: Secondary | ICD-10-CM | POA: Diagnosis not present

## 2021-07-18 DIAGNOSIS — M5442 Lumbago with sciatica, left side: Secondary | ICD-10-CM | POA: Diagnosis not present

## 2021-07-19 ENCOUNTER — Ambulatory Visit: Payer: Medicare Other | Admitting: Family Medicine

## 2021-07-20 ENCOUNTER — Ambulatory Visit: Payer: Medicare Other | Admitting: Family Medicine

## 2021-07-26 ENCOUNTER — Encounter: Payer: Self-pay | Admitting: Family Medicine

## 2021-07-26 ENCOUNTER — Ambulatory Visit (INDEPENDENT_AMBULATORY_CARE_PROVIDER_SITE_OTHER): Payer: Medicare Other | Admitting: Family Medicine

## 2021-07-26 VITALS — BP 139/66 | HR 72 | Temp 97.8°F | Ht <= 58 in | Wt 175.4 lb

## 2021-07-26 DIAGNOSIS — R600 Localized edema: Secondary | ICD-10-CM

## 2021-07-26 DIAGNOSIS — R0609 Other forms of dyspnea: Secondary | ICD-10-CM | POA: Diagnosis not present

## 2021-07-26 DIAGNOSIS — I10 Essential (primary) hypertension: Secondary | ICD-10-CM

## 2021-07-26 DIAGNOSIS — I272 Pulmonary hypertension, unspecified: Secondary | ICD-10-CM | POA: Diagnosis not present

## 2021-07-26 DIAGNOSIS — Z9989 Dependence on other enabling machines and devices: Secondary | ICD-10-CM

## 2021-07-26 DIAGNOSIS — G4733 Obstructive sleep apnea (adult) (pediatric): Secondary | ICD-10-CM

## 2021-07-26 NOTE — Patient Instructions (Addendum)
I want to try and review your records regarding the chronic shortness of breath   Stay on current medicines  If swelling gets worse again please let me know   Take a look at the [facility] eating plan   Try to get most of your carbohydrates from produce (with the exception of white potatoes)  Eat less bread/pasta/rice/snack foods/cereals/sweets and other items from the middle of the grocery store (processed carbs)

## 2021-07-26 NOTE — Assessment & Plan Note (Signed)
Under care of cardiology  They do not think this causes her sob  Rev her last echo/reassuring
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
## 2021-07-26 NOTE — Assessment & Plan Note (Signed)
  bp in fair control at this time  Systolic is borderline BP Readings from Last 1 Encounters:  07/26/21 139/66   No changes needed Most recent labs reviewed  Disc lifstyle change with low sodium diet and exercise  Tolerating amlodipine 5 mg with mild pedal edema (much worse when she tried 10) hctz 25 mg daily  Can consider arb but due to h/o anaphylaxis with bee stings ace/arb are not first choice

## 2021-07-26 NOTE — Assessment & Plan Note (Signed)
On cpap 
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
## 2021-07-26 NOTE — Assessment & Plan Note (Addendum)
Worse with amlodipine 10 Now much improved with amlodipine 5 mg -tolerating this ok  supp hose are an option  Rev last labs and cardiac studies in setting of chronic sob (last echol did not show CHF) She tolerates it  If worse may want to consider change from amlodipine Given handout re: DASH eating plan  elt elevation encouraged

## 2021-07-27 ENCOUNTER — Encounter: Payer: Self-pay | Admitting: Family Medicine

## 2021-07-27 DIAGNOSIS — R7303 Prediabetes: Secondary | ICD-10-CM

## 2021-08-04 ENCOUNTER — Other Ambulatory Visit: Payer: Self-pay | Admitting: *Deleted

## 2021-08-04 ENCOUNTER — Other Ambulatory Visit: Payer: Medicare Other

## 2021-08-04 DIAGNOSIS — R7303 Prediabetes: Secondary | ICD-10-CM

## 2021-08-04 LAB — POCT GLYCOSYLATED HEMOGLOBIN (HGB A1C): Hemoglobin A1C: 6.3 % — AB (ref 4.0–5.6)

## 2021-08-08 ENCOUNTER — Ambulatory Visit: Payer: Medicare Other | Admitting: Internal Medicine

## 2021-08-09 ENCOUNTER — Encounter: Payer: Self-pay | Admitting: Family Medicine

## 2021-08-09 ENCOUNTER — Ambulatory Visit: Payer: Medicare Other | Admitting: Internal Medicine

## 2021-08-09 ENCOUNTER — Encounter: Payer: Self-pay | Admitting: Internal Medicine

## 2021-08-09 ENCOUNTER — Ambulatory Visit
Admission: RE | Admit: 2021-08-09 | Discharge: 2021-08-09 | Disposition: A | Discharge status: Home / Self Care | Payer: Medicare Other | Source: Ambulatory Visit | Attending: Family Medicine | Admitting: Family Medicine

## 2021-08-09 ENCOUNTER — Encounter (INDEPENDENT_AMBULATORY_CARE_PROVIDER_SITE_OTHER): Payer: Self-pay

## 2021-08-09 VITALS — BP 126/60 | HR 72 | Temp 98.4°F | Resp 16 | Ht <= 58 in | Wt 177.4 lb

## 2021-08-09 DIAGNOSIS — R0602 Shortness of breath: Secondary | ICD-10-CM | POA: Diagnosis not present

## 2021-08-09 DIAGNOSIS — K219 Gastro-esophageal reflux disease without esophagitis: Secondary | ICD-10-CM

## 2021-08-09 DIAGNOSIS — J383 Other diseases of vocal cords: Secondary | ICD-10-CM

## 2021-08-09 DIAGNOSIS — E2839 Other primary ovarian failure: Secondary | ICD-10-CM | POA: Insufficient documentation

## 2021-08-09 DIAGNOSIS — M85852 Other specified disorders of bone density and structure, left thigh: Secondary | ICD-10-CM | POA: Diagnosis not present

## 2021-08-09 DIAGNOSIS — J986 Disorders of diaphragm: Secondary | ICD-10-CM | POA: Diagnosis not present

## 2021-08-09 DIAGNOSIS — G4733 Obstructive sleep apnea (adult) (pediatric): Secondary | ICD-10-CM

## 2021-08-09 DIAGNOSIS — J45990 Exercise induced bronchospasm: Secondary | ICD-10-CM

## 2021-08-09 DIAGNOSIS — M85832 Other specified disorders of bone density and structure, left forearm: Secondary | ICD-10-CM | POA: Diagnosis not present

## 2021-08-09 NOTE — Patient Instructions (Signed)

## 2021-08-09 NOTE — Progress Notes (Signed)
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 as asthma as well as diaphragmatic dysfunction.  5. Obesity, morbid ([place]) Obesity Counseling: Had a lengthy discussion regarding patients BMI and weight issues. Patient was instructed on portion control as well as increased activity. Also discussed caloric restrictions with trying to maintain intake less than 2000 Kcal. Discussions were made in accordance with the 5As of weight management. Simple actions such as not eating late and if able to, taking  a walk is suggested.   6. GERD without esophagitis PPI usage as needed defer to primary care team  7. Vocal cord dysfunction She does have some issues with vocal cord dysfunction I would recommend getting a speech evaluation and retraining if at all possible - SLP eval and treat; Future   General Counseling: I have discussed the findings of the evaluation and examination with Pamala Hurry.  I have also discussed any further diagnostic evaluation thatmay be needed or ordered today. Jessa verbalizes understanding of the findings of todays visit. We also reviewed her medications today and discussed drug interactions and side effects including but not limited excessive drowsiness and altered mental states. We also discussed that there is always a risk not just to her but also people around her. she has been encouraged to call the office with any questions or concerns that should arise related to todays visit.  Orders Placed This Encounter  Procedures   SLP eval and treat    Standing Status:   Future    Standing Expiration Date:   08/10/2022     Time spent: 62  I have personally obtained a history, examined the patient, evaluated laboratory and imaging results, formulated the assessment and plan and placed orders.    Allyne Gee, MD [facility] Pulmonary and Critical Care Sleep medicine

## 2021-08-10 ENCOUNTER — Telehealth: Payer: Self-pay

## 2021-08-10 NOTE — Telephone Encounter (Signed)
Spoke with pt, she will call back to confirm the company she uses for Cpap supplies

## 2021-08-10 NOTE — Telephone Encounter (Signed)
Sent message to [facility] for cpap order

## 2021-08-14 ENCOUNTER — Other Ambulatory Visit: Payer: Self-pay | Admitting: Gastroenterology

## 2021-08-14 ENCOUNTER — Other Ambulatory Visit: Payer: Self-pay | Admitting: Family Medicine

## 2021-08-14 DIAGNOSIS — E7800 Pure hypercholesterolemia, unspecified: Secondary | ICD-10-CM

## 2021-08-16 ENCOUNTER — Telehealth: Payer: Self-pay

## 2021-08-16 DIAGNOSIS — M961 Postlaminectomy syndrome, not elsewhere classified: Secondary | ICD-10-CM | POA: Diagnosis not present

## 2021-08-16 DIAGNOSIS — M549 Dorsalgia, unspecified: Secondary | ICD-10-CM | POA: Diagnosis not present

## 2021-08-16 DIAGNOSIS — M5431 Sciatica, right side: Secondary | ICD-10-CM | POA: Diagnosis not present

## 2021-08-16 DIAGNOSIS — M5432 Sciatica, left side: Secondary | ICD-10-CM | POA: Diagnosis not present

## 2021-08-16 MED ORDER — DEXLANSOPRAZOLE 60 MG PO CPDR: 60.0000 mg | DELAYED_RELEASE_CAPSULE | Freq: Every day | ORAL | 0 refills | Status: DC

## 2021-08-16 NOTE — Addendum Note (Signed)
Addended by: Ulyess Blossom L on: 08/16/2021 01:14 PM   Modules accepted: Orders

## 2021-08-16 NOTE — Telephone Encounter (Signed)
We can switch to Dexilant 60 mg daily if her insurance covers otherwise she could try omeprazole 40 mg p.o. twice daily before meals  RV

## 2021-08-16 NOTE — Telephone Encounter (Signed)
Patient verbalized understanding and sent medication to the pharmacy

## 2021-08-16 NOTE — Telephone Encounter (Signed)
Patient is calling because for the last 6 weeks she has been having a lot of acid reflex come back up in her mouth. She denies any burning or abdominal pain. She is currently taking Omeprazole 77m once a day but it is not working she states. She would like to be changed to a different medication.  She has not been seen in the office since 10/21/2019 and made a follow up appointment for next available which is 01/03/22

## 2021-08-19 DIAGNOSIS — M438X4 Other specified deforming dorsopathies, thoracic region: Secondary | ICD-10-CM | POA: Diagnosis not present

## 2021-08-19 DIAGNOSIS — M4804 Spinal stenosis, thoracic region: Secondary | ICD-10-CM | POA: Diagnosis not present

## 2021-08-19 DIAGNOSIS — M961 Postlaminectomy syndrome, not elsewhere classified: Secondary | ICD-10-CM | POA: Diagnosis not present

## 2021-08-19 DIAGNOSIS — M549 Dorsalgia, unspecified: Secondary | ICD-10-CM | POA: Diagnosis not present

## 2021-08-19 DIAGNOSIS — M5134 Other intervertebral disc degeneration, thoracic region: Secondary | ICD-10-CM | POA: Diagnosis not present

## 2021-08-23 DIAGNOSIS — M961 Postlaminectomy syndrome, not elsewhere classified: Secondary | ICD-10-CM | POA: Diagnosis not present

## 2021-08-23 DIAGNOSIS — M5431 Sciatica, right side: Secondary | ICD-10-CM | POA: Diagnosis not present

## 2021-08-23 DIAGNOSIS — M5432 Sciatica, left side: Secondary | ICD-10-CM | POA: Diagnosis not present

## 2021-08-26 ENCOUNTER — Ambulatory Visit (INDEPENDENT_AMBULATORY_CARE_PROVIDER_SITE_OTHER): Payer: Medicare Other

## 2021-08-26 VITALS — Ht <= 58 in | Wt 173.5 lb

## 2021-08-26 DIAGNOSIS — Z0000 Encounter for general adult medical examination without abnormal findings: Secondary | ICD-10-CM

## 2021-08-26 NOTE — Patient Instructions (Signed)
Erin Petersen , Thank you for taking time to come for your Medicare Wellness Visit. I appreciate your ongoing commitment to your health goals. Please review the following plan we discussed and let me know if I can assist you in the future.   These are the goals we discussed:  Goals      Manage My Medicine     Timeframe:  Long-Range Goal Priority:  Medium Start Date:       02/09/21                      Expected End Date:    02/09/22                   Follow Up Date March 2023   - call for medicine refill 2 or 3 days before it runs out - call if I am sick and can't take my medicine - keep a list of all the medicines I take; vitamins and herbals too - use a pillbox to sort medicine  -Try amitriptyline 1/2 dose at bedtime -Trial off omeprazole. Try famotidine 20 mg daily or as needed instead   Why is this important?   These steps will help you keep on track with your medicines.   Notes:      Patient Stated     04/17/2019, I will maintain and continue medications as prescribed.         This is a list of the screening recommended for you and due dates:  Health Maintenance  Topic Date Due   Urine Protein Check  Never done   Hepatitis C Screening: USPSTF Recommendation to screen - Ages 9-79 yo.  Never done   Zoster (Shingles) Vaccine (1 of 2) Never done   Tetanus Vaccine  11/02/2011   COVID-19 Vaccine (4 - Booster for Pfizer series) 12/26/2019   Complete foot exam   08/25/2021   Mammogram  09/20/2021   Flu Shot  09/27/2021   Eye exam for diabetics  11/04/2021   Hemoglobin A1C  02/03/2022   Pneumonia Vaccine  Completed   DEXA scan (bone density measurement)  Completed   HPV Vaccine  Aged Out   Advanced directives: Yes  Conditions/risks identified: None  Next appointment: Follow up in one year for your annual wellness visit     Preventive Care 65 Years and Older, Female Preventive care refers to lifestyle choices and visits with your health care provider that can  promote health and wellness. What does preventive care include? A yearly physical exam. This is also called an annual well check. Dental exams once or twice a year. Routine eye exams. Ask your health care provider how often you should have your eyes checked. Personal lifestyle choices, including: Daily care of your teeth and gums. Regular physical activity. Eating a healthy diet. Avoiding tobacco and drug use. Limiting alcohol use. Practicing safe sex. Taking low-dose aspirin every day. Taking vitamin and mineral supplements as recommended by your health care provider. What happens during an annual well check? The services and screenings done by your health care provider during your annual well check will depend on your age, overall health, lifestyle risk factors, and family history of disease. Counseling  Your health care provider may ask you questions about your: Alcohol use. Tobacco use. Drug use. Emotional well-being. Home and relationship well-being. Sexual activity. Eating habits. History of falls. Memory and ability to understand (cognition). Work and work Statistician. Reproductive health. Screening  You may have the following  tests or measurements: Height, weight, and BMI. Blood pressure. Lipid and cholesterol levels. These may be checked every 5 years, or more frequently if you are over 37 years old. Skin check. Lung cancer screening. You may have this screening every year starting at age 43 if you have a 30-pack-year history of smoking and currently smoke or have quit within the past 15 years. Fecal occult blood test (FOBT) of the stool. You may have this test every year starting at age 48. Flexible sigmoidoscopy or colonoscopy. You may have a sigmoidoscopy every 5 years or a colonoscopy every 10 years starting at age 92. Hepatitis C blood test. Hepatitis B blood test. Sexually transmitted disease (STD) testing. Diabetes screening. This is done by checking your  blood sugar (glucose) after you have not eaten for a while (fasting). You may have this done every 1-3 years. Bone density scan. This is done to screen for osteoporosis. You may have this done starting at age 53. Mammogram. This may be done every 1-2 years. Talk to your health care provider about how often you should have regular mammograms. Talk with your health care provider about your test results, treatment options, and if necessary, the need for more tests. Vaccines  Your health care provider may recommend certain vaccines, such as: Influenza vaccine. This is recommended every year. Tetanus, diphtheria, and acellular pertussis (Tdap, Td) vaccine. You may need a Td booster every 10 years. Zoster vaccine. You may need this after age 83. Pneumococcal 13-valent conjugate (PCV13) vaccine. One dose is recommended after age 67. Pneumococcal polysaccharide (PPSV23) vaccine. One dose is recommended after age 43. Talk to your health care provider about which screenings and vaccines you need and how often you need them. This information is not intended to replace advice given to you by your health care provider. Make sure you discuss any questions you have with your health care provider. Document Released: 03/12/2015 Document Revised: 11/03/2015 Document Reviewed: 12/15/2014 Elsevier Interactive Patient Education  2017 [place] Prevention in the Home Falls can cause injuries. They can happen to people of all ages. There are many things you can do to make your home safe and to help prevent falls. What can I do on the outside of my home? Regularly fix the edges of walkways and driveways and fix any cracks. Remove anything that might make you trip as you walk through a door, such as a raised step or threshold. Trim any bushes or trees on the path to your home. Use bright outdoor lighting. Clear any walking paths of anything that might make someone trip, such as rocks or tools. Regularly  check to see if handrails are loose or broken. Make sure that both sides of any steps have handrails. Any raised decks and porches should have guardrails on the edges. Have any leaves, snow, or ice cleared regularly. Use sand or salt on walking paths during winter. Clean up any spills in your garage right away. This includes oil or grease spills. What can I do in the bathroom? Use night lights. Install grab bars by the toilet and in the tub and shower. Do not use towel bars as grab bars. Use non-skid mats or decals in the tub or shower. If you need to sit down in the shower, use a plastic, non-slip stool. Keep the floor dry. Clean up any water that spills on the floor as soon as it happens. Remove soap buildup in the tub or shower regularly. Attach bath mats securely with  double-sided non-slip rug tape. Do not have throw rugs and other things on the floor that can make you trip. What can I do in the bedroom? Use night lights. Make sure that you have a light by your bed that is easy to reach. Do not use any sheets or blankets that are too big for your bed. They should not hang down onto the floor. Have a firm chair that has side arms. You can use this for support while you get dressed. Do not have throw rugs and other things on the floor that can make you trip. What can I do in the kitchen? Clean up any spills right away. Avoid walking on wet floors. Keep items that you use a lot in easy-to-reach places. If you need to reach something above you, use a strong step stool that has a grab bar. Keep electrical cords out of the way. Do not use floor polish or wax that makes floors slippery. If you must use wax, use non-skid floor wax. Do not have throw rugs and other things on the floor that can make you trip. What can I do with my stairs? Do not leave any items on the stairs. Make sure that there are handrails on both sides of the stairs and use them. Fix handrails that are broken or loose.  Make sure that handrails are as long as the stairways. Check any carpeting to make sure that it is firmly attached to the stairs. Fix any carpet that is loose or worn. Avoid having throw rugs at the top or bottom of the stairs. If you do have throw rugs, attach them to the floor with carpet tape. Make sure that you have a light switch at the top of the stairs and the bottom of the stairs. If you do not have them, ask someone to add them for you. What else can I do to help prevent falls? Wear shoes that: Do not have high heels. Have rubber bottoms. Are comfortable and fit you well. Are closed at the toe. Do not wear sandals. If you use a stepladder: Make sure that it is fully opened. Do not climb a closed stepladder. Make sure that both sides of the stepladder are locked into place. Ask someone to hold it for you, if possible. Clearly mark and make sure that you can see: Any grab bars or handrails. First and last steps. Where the edge of each step is. Use tools that help you move around (mobility aids) if they are needed. These include: Canes. Walkers. Scooters. Crutches. Turn on the lights when you go into a dark area. Replace any light bulbs as soon as they burn out. Set up your furniture so you have a clear path. Avoid moving your furniture around. If any of your floors are uneven, fix them. If there are any pets around you, be aware of where they are. Review your medicines with your doctor. Some medicines can make you feel dizzy. This can increase your chance of falling. Ask your doctor what other things that you can do to help prevent falls. This information is not intended to replace advice given to you by your health care provider. Make sure you discuss any questions you have with your health care provider. Document Released: 12/10/2008 Document Revised: 07/22/2015 Document Reviewed: 03/20/2014 Elsevier Interactive Patient Education  2017 [employer].

## 2021-08-26 NOTE — Progress Notes (Signed)
Subjective:   Erin Petersen is a 81 y.o. female who presents for Medicare Annual (Subsequent) preventive examination.  Review of Systems    Virtual Visit via Telephone Note  I connected with  Erin Petersen on 08/26/21 at 12:30 PM EDT by telephone and verified that I am speaking with the correct person using two identifiers.  Location: Patient: Home Provider: Office Persons participating in the virtual visit: patient/Nurse Health Advisor   I discussed the limitations, risks, security and privacy concerns of performing an evaluation and management service by telephone and the availability of in person appointments. The patient expressed understanding and agreed to proceed.  Interactive audio and video telecommunications were attempted between this nurse and patient, however failed, due to patient having technical difficulties OR patient did not have access to video capability.  We continued and completed visit with audio only.  Some vital signs may be absent or patient reported.   Criselda Peaches, LPN  Cardiac Risk Factors include: advanced age (>100mn, >>38women);hypertension     Objective:    Today's Vitals   08/26/21 1236  Weight: 173 lb 8 oz (78.7 kg)  Height: 4' 10"  (1.473 m)   Body mass index is 36.26 kg/m.     08/26/2021   12:50 PM 10/21/2020    5:33 PM 10/12/2020    9:55 AM 10/12/2020    6:16 AM 10/01/2020    9:49 AM 09/10/2020   11:20 AM 09/08/2020   11:11 AM  Advanced Directives  Does Patient Have a Medical Advance Directive? Yes Yes Yes Yes Yes Yes Yes  Type of [place]amed[place]of [place]Living will Living will Living will Living will Living will [place] will [place] will  Does patient want to make changes to medical advance directive? No - Patient declined  No - Patient declined No - Patient declined  No - Patient declined No - Patient declined  Copy of [place] in Chart? No - copy requested          Current Medications (verified) Outpatient Encounter Medications as of 08/26/2021  Medication Sig   albuterol (VENTOLIN HFA) 108 (90 Base) MCG/ACT inhaler Inhale 2 puffs into the lungs every 6 (six) hours as needed for wheezing or shortness of breath.   Alpha-Lipoic Acid 600 MG CAPS Take 1 capsule by mouth daily.   amitriptyline (ELAVIL) 25 MG tablet TAKE 1 TABLET AT BEDTIME   amLODipine (NORVASC) 5 MG tablet Take 1 tablet (5 mg total) by mouth daily.   aspirin 81 MG EC tablet Take 81 mg by mouth at bedtime.    cephALEXin (KEFLEX) 500 MG capsule Take 500 mg by mouth. PRN dentist appts for 1 year following knee replacement   Cholecalciferol (VITAMIN D3) 50 MCG (2000 UT) TABS Take 5,000 Units by mouth daily.   Cyanocobalamin (VITAMIN B-12) 1000 MCG SUBL Take 2,000 mcg by mouth daily.   dexlansoprazole (DEXILANT) 60 MG capsule Take 1 capsule (60 mg total) by mouth daily.   EPINEPHrine 0.3 mg/0.3 mL IJ SOAJ injection Inject 0.3 mg into the muscle as needed for anaphylaxis.   hydrochlorothiazide (HYDRODIURIL) 25 MG tablet TAKE 1 TABLET DAILY   meloxicam (MOBIC) 15 MG tablet Take 1 tablet (15 mg total) by mouth daily as needed for pain. with food   polyethylene glycol (MIRALAX / GLYCOLAX) 17 g packet Take 1 packet by mouth daily.    pregabalin (LYRICA) 150 MG capsule Take 1 capsule (150  mg total) by mouth 2 (two) times daily. (Patient taking differently: Take 150 mg by mouth See admin instructions. Take 150 mg by mouth in the morning and  300 mg in the afternoon)   rOPINIRole (REQUIP) 0.25 MG tablet Take 0.75 mg by mouth in the morning and at bedtime. One in am and one in afternoon   rOPINIRole (REQUIP) 4 MG tablet TAKE 1 TABLET AT BEDTIME   simvastatin (ZOCOR) 20 MG tablet TAKE 1 TABLET AT BEDTIME   No facility-administered encounter medications on file as of 08/26/2021.    Allergies (verified) Bee venom, Fosamax [alendronate], Ciprofloxacin hcl, and Sulfa  antibiotics   History: Past Medical History:  Diagnosis Date   Arthritis    Asthma    exercise induced asthma   Bacteremia due to Gram-negative bacteria 06/04/2019   Colon polyps    Diabetes mellitus without complication (HCC)    Duodenal ulcer    Dyspnea    GERD (gastroesophageal reflux disease)    Hyperlipidemia    Hypertension    Neuropathy    right leg and foot   Plantar fasciitis    Pre-diabetes    Prediabetes    RLQ abdominal pain    RLS (restless legs syndrome)    Sleep apnea    CPAP   SOB (shortness of breath)    Urinary incontinence    Urinary tract infection    Wears hearing aid in right ear    Past Surgical History:  Procedure Laterality Date   [place]  2002   lumbar fusion   BILATERAL SALPINGOOPHORECTOMY  01/07/2018   BIOPSY  04/15/2019   Procedure: BIOPSY;  Surgeon: [name], MD;  Location: [place];  Service: Endoscopy;;   CARDIAC CATHETERIZATION  2019   COLONOSCOPY WITH PROPOFOL N/A 11/11/2018   Procedure: COLONOSCOPY WITH PROPOFOL;  Surgeon: [name], MD;  Location: [facility];  Service: Gastroenterology;  Laterality: N/A;   CYSTO WITH HYDRODISTENSION N/A 12/30/2018   Procedure: CYSTOSCOPY/HYDRODISTENSION WITH MARCAINE;  Surgeon: Bjorn Loser, MD;  Location: [facility];  Service: Urology;  Laterality: N/A;   ESOPHAGOGASTRODUODENOSCOPY (EGD) WITH PROPOFOL N/A 04/15/2019   Procedure: ESOPHAGOGASTRODUODENOSCOPY (EGD) WITH PROPOFOL;  Surgeon: [name], MD;  Location: [place];  Service: Endoscopy;  Laterality: N/A;  sleep apnea   ESOPHAGOGASTRODUODENOSCOPY (EGD) WITH PROPOFOL N/A 07/09/2019   Procedure: ESOPHAGOGASTRODUODENOSCOPY (EGD) WITH PROPOFOL;  Surgeon: [name], MD;  Location: [place];  Service: Gastroenterology;  Laterality: N/A;   EYE SURGERY Bilateral    HERNIA REPAIR  2018   abd   HERNIA REPAIR      umbilical   INGUINAL HERNIA REPAIR Bilateral    inguinal   JOINT REPLACEMENT     LAPAROSCOPIC BILATERAL SALPINGO OOPHERECTOMY  2019   SPINAL FUSION  2002   TONSILLECTOMY AND ADENOIDECTOMY  1961   TOTAL KNEE ARTHROPLASTY Right 01/01/2020   Procedure: Right total knee arthroplasty - [name] to Assist;  Surgeon: Hessie Knows, MD;  Location: [facility];  Service: Orthopedics;  Laterality: Right;   TOTAL KNEE ARTHROPLASTY Left 10/12/2020   Procedure: TOTAL KNEE ARTHROPLASTY;  Surgeon: Hessie Knows, MD;  Location: [facility];  Service: Orthopedics;  Laterality: Left;   TUBAL LIGATION     Family History  Problem Relation Age of Onset   Alcohol abuse Mother    Cancer Mother    Stroke Mother    Alcohol abuse Father    Arthritis  Sister    Arthritis Daughter    COPD Daughter    Arthritis Paternal Grandmother    Asthma Paternal Grandmother    COPD Paternal Grandmother    Breast cancer Paternal Aunt    Social History   Socioeconomic History   Marital status: Widowed    Spouse name: Not on file   Number of children: 3   Years of education: Not on file   Highest education level: Bachelor's degree (e.g., BA, AB, BS)  Occupational History   Not on file  Tobacco Use   Smoking status: Never   Smokeless tobacco: Never  Vaping Use   Vaping Use: Never used  Substance and Sexual Activity   Alcohol use: Not Currently   Drug use: Never   Sexual activity: Not Currently  Other Topics Concern   Not on file  Social History Narrative   Moved from [place] 2020, lives near daughter. Retired. Enjoys walking.    Social Determinants of Health   Financial Resource Strain: Low Risk  (08/26/2021)   Overall Financial Resource Strain (CARDIA)    Difficulty of Paying Living Expenses: Not hard at all  Food Insecurity: No Food Insecurity (08/26/2021)   Hunger Vital Sign    Worried About Running Out of Food in the Last Year: Never true    Ran Out of Food in the Last Year: Never true  Transportation  Needs: No Transportation Needs (08/26/2021)   PRAPARE - Hydrologist (Medical): No    Lack of Transportation (Non-Medical): No  Physical Activity: Inactive (08/26/2021)   Exercise Vital Sign    Days of Exercise per Week: 0 days    Minutes of Exercise per Session: 0 min  Stress: No Stress Concern Present (08/26/2021)   [place]    Feeling of Stress : Not at all  Social Connections: Moderately Integrated (08/26/2021)   Social Connection and Isolation Panel [NHANES]    Frequency of Communication with Friends and Family: More than three times a week    Frequency of Social Gatherings with Friends and Family: Three times a week    Attends Religious Services: More than 4 times per year    Active Member of Clubs or Organizations: Yes    Attends Archivist Meetings: More than 4 times per year    Marital Status: Widowed    Tobacco Counseling Counseling given: Not Answered   Clinical Intake:  Pre-visit preparation completed: Yes  Pain : No/denies pain     BMI - recorded: 37.07 Nutritional Status: BMI > 30  Obese Nutritional Risks: None Diabetes: No  How often do you need to have someone help you when you read instructions, pamphlets, or other written materials from your doctor or pharmacy?: 1 - Never  Diabetic?  No  Interpreter Needed?: NoActivities of Daily Living    08/26/2021   12:43 PM 08/25/2021    3:55 PM  In your present state of health, do you have any difficulty performing the following activities:  Hearing? 1 0  Comment Wears hearing aids   Vision? 0 0  Difficulty concentrating or making decisions? 0 0  Walking or climbing stairs? 1 1  Comment Pending back surgery. Followed by Dr Patrice Paradise   Dressing or bathing? 0 0  Doing errands, shopping? 0 0  Preparing Food and eating ? N N  Using the Toilet? N N  In the past six months, have you accidently leaked urine? Tempie Donning  Comment Dx Cystitis. Wears pads. Followed by Urologist   Do you have problems with loss of bowel control? N N  Managing your Medications? N N  Managing your Finances? N N  Housekeeping or managing your Housekeeping? N N    Patient Care Team: Tower, Wynelle Fanny, MD as PCP - General (Family Medicine) Charlton Haws, [facility] as Pharmacist (Pharmacist)  Indicate any recent Medical Services you may have received from other than [facility] providers in the past year (date may be approximate).     Assessment:   This is a routine wellness examination for [place].  Hearing/Vision screen Hearing Screening - Comments:: Wears hearing aids Vision Screening - Comments:: Wears glasses.   Dietary issues and exercise activities discussed: Exercise limited by: None identified   Goals Addressed               This Visit's Progress     Patient Stated (pt-stated)         I will maintain and continue medications as prescribed also I would like to lose weight.      Depression Screen    08/26/2021   12:41 PM 09/10/2020   11:20 AM 08/25/2020    9:34 AM 04/08/2020   10:52 AM 04/17/2019    9:26 AM 10/04/2018    8:41 AM  PHQ 2/9 Scores  PHQ - 2 Score 0 0 0 0 0 0  PHQ- 9 Score     0     Fall Risk    08/26/2021   12:48 PM 08/25/2021    3:55 PM 09/10/2020   11:20 AM 08/25/2020    9:34 AM 04/08/2020   10:52 AM  Fall Risk   Falls in the past year? 1 1 1 1 1   Number falls in past yr: 0 0 0 0 1  Injury with Fall? 0 0 0 0 0  Comment No injury or medical attention needed      Risk for fall due to : No Fall Risks      Follow up    Falls evaluation completed     FALL RISK PREVENTION PERTAINING TO THE HOME:  Any stairs in or around the home? No  If so, are there any without handrails? No  Home free of loose throw rugs in walkways, pet beds, electrical cords, etc? Yes  Adequate lighting in your home to reduce risk of falls? Yes   ASSISTIVE DEVICES UTILIZED TO PREVENT FALLS:  Life alert? No  Use of a  cane, walker or w/c? Yes  Grab bars in the bathroom? Yes  Shower chair or bench in shower? Yes  Elevated toilet seat or a handicapped toilet? Yes   TIMED UP AND GO:  Was the test performed? No . Audio Visit  Cognitive Function:    04/17/2019    9:30 AM  MMSE - Mini Mental State Exam  Orientation to time 5  Orientation to Place 5  Registration 3  Attention/ Calculation 5  Recall 3  Language- repeat 1        08/26/2021   12:50 PM  6CIT Screen  What Year? 0 points  What month? 0 points  What time? 0 points  Count back from 20 0 points  Months in reverse 0 points  Repeat phrase 0 points  Total Score 0 points    Immunizations Immunization History  Administered Date(s) Administered   Fluad Quad(high Dose 65+) 12/03/2018   Influenza Split 02/22/2001   Influenza,inj,Quad PF,6+ Mos 03/12/2020, 11/17/2020   Influenza-Unspecified  11/27/2017   PFIZER(Purple Top)SARS-COV-2 Vaccination 03/06/2019, 03/29/2019, 10/31/2019   PNEUMOCOCCAL CONJUGATE-20 08/25/2020   PPD Test 02/27/2014   Pneumococcal Polysaccharide-23 10/26/1999   Td 11/01/2001   Tdap 11/01/2001    TDAP status: Due, Education has been provided regarding the importance of this vaccine. Advised may receive this vaccine at local pharmacy or Health Dept. Aware to provide a copy of the vaccination record if obtained from local pharmacy or Health Dept. Verbalized acceptance and understanding.  Flu Vaccine status: Up to date  Pneumococcal vaccine status: Up to date  Covid-19 vaccine status: Completed vaccines  Qualifies for Shingles Vaccine? Yes   Zostavax completed No   Shingrix Completed?: No.    Education has been provided regarding the importance of this vaccine. Patient has been advised to call insurance company to determine out of pocket expense if they have not yet received this vaccine. Advised may also receive vaccine at local pharmacy or Health Dept. Verbalized acceptance and understanding.  Screening  Tests Health Maintenance  Topic Date Due   URINE MICROALBUMIN  Never done   Hepatitis C Screening  Never done   FOOT EXAM  08/25/2021   COVID-19 Vaccine (4 - Booster for [place] series) 09/11/2021 (Originally 12/26/2019)   Zoster Vaccines- Shingrix (1 of 2) 11/26/2021 (Originally 09/23/1959)   TETANUS/TDAP  08/27/2022 (Originally 11/02/2011)   MAMMOGRAM  09/20/2021   INFLUENZA VACCINE  09/27/2021   OPHTHALMOLOGY EXAM  11/04/2021   HEMOGLOBIN A1C  02/03/2022   Pneumonia Vaccine 87+ Years old  Completed   DEXA SCAN  Completed   HPV VACCINES  Aged Out    Health Maintenance  Health Maintenance Due  Topic Date Due   URINE MICROALBUMIN  Never done   Hepatitis C Screening  Never done   FOOT EXAM  08/25/2021    Colorectal cancer screening: No longer required.   Mammogram status: Completed 09/20/20. Repeat every year  Bone Density status: Completed 08/09/21. Results reflect: Bone density results: NORMAL. Repeat every 5 years.  Lung Cancer Screening: (Low Dose CT Chest recommended if Age 58-80 years, 30 pack-year currently smoking OR have quit w/in 15years.) does not qualify.     Additional Screening:  Hepatitis C Screening: does not qualify; Completed   Vision Screening: Recommended annual ophthalmology exams for early detection of glaucoma and other disorders of the eye. Is the patient up to date with their annual eye exam?  Yes  Who is the provider or what is the name of the office in which the patient attends annual eye exams? Patient unsure If pt is not established with a provider, would they like to be referred to a provider to establish care? No .   Dental Screening: Recommended annual dental exams for proper oral hygiene  Community Resource Referral / Chronic Care Management  CRR required this visit?  No   CCM required this visit?  No      Plan:     I have personally reviewed and noted the following in the patient's chart:   Medical and social history Use of  alcohol, tobacco or illicit drugs  Current medications and supplements including opioid prescriptions.  Functional ability and status Nutritional status Physical activity Advanced directives List of other physicians Hospitalizations, surgeries, and ER visits in previous 12 months Vitals Screenings to include cognitive, depression, and falls Referrals and appointments  In addition, I have reviewed and discussed with patient certain preventive protocols, quality metrics, and best practice recommendations. A written personalized care plan for preventive services as well  as general preventive health recommendations were provided to patient.     Criselda Peaches, LPN   2/48/1443   Nurse Notes: None

## 2021-09-01 DIAGNOSIS — M4726 Other spondylosis with radiculopathy, lumbar region: Secondary | ICD-10-CM | POA: Diagnosis not present

## 2021-09-05 ENCOUNTER — Encounter: Payer: Self-pay | Admitting: Family Medicine

## 2021-09-07 DIAGNOSIS — G4733 Obstructive sleep apnea (adult) (pediatric): Secondary | ICD-10-CM | POA: Diagnosis not present

## 2021-09-07 DIAGNOSIS — I272 Pulmonary hypertension, unspecified: Secondary | ICD-10-CM | POA: Diagnosis not present

## 2021-09-07 DIAGNOSIS — I10 Essential (primary) hypertension: Secondary | ICD-10-CM | POA: Diagnosis not present

## 2021-09-07 DIAGNOSIS — R0602 Shortness of breath: Secondary | ICD-10-CM | POA: Diagnosis not present

## 2021-09-16 DIAGNOSIS — R0602 Shortness of breath: Secondary | ICD-10-CM | POA: Diagnosis not present

## 2021-09-16 DIAGNOSIS — Z01818 Encounter for other preprocedural examination: Secondary | ICD-10-CM | POA: Diagnosis not present

## 2021-09-28 ENCOUNTER — Other Ambulatory Visit: Payer: Self-pay | Admitting: Family Medicine

## 2021-10-04 DIAGNOSIS — M4156 Other secondary scoliosis, lumbar region: Secondary | ICD-10-CM | POA: Insufficient documentation

## 2021-10-04 DIAGNOSIS — M961 Postlaminectomy syndrome, not elsewhere classified: Secondary | ICD-10-CM | POA: Insufficient documentation

## 2021-10-07 DIAGNOSIS — G4733 Obstructive sleep apnea (adult) (pediatric): Secondary | ICD-10-CM | POA: Diagnosis not present

## 2021-10-07 DIAGNOSIS — I10 Essential (primary) hypertension: Secondary | ICD-10-CM | POA: Diagnosis not present

## 2021-10-07 DIAGNOSIS — I2510 Atherosclerotic heart disease of native coronary artery without angina pectoris: Secondary | ICD-10-CM | POA: Insufficient documentation

## 2021-10-07 DIAGNOSIS — E782 Mixed hyperlipidemia: Secondary | ICD-10-CM | POA: Diagnosis not present

## 2021-10-24 DIAGNOSIS — G4733 Obstructive sleep apnea (adult) (pediatric): Secondary | ICD-10-CM | POA: Diagnosis not present

## 2021-10-28 ENCOUNTER — Telehealth: Payer: Self-pay

## 2021-10-28 DIAGNOSIS — M5432 Sciatica, left side: Secondary | ICD-10-CM | POA: Diagnosis not present

## 2021-10-28 DIAGNOSIS — M5431 Sciatica, right side: Secondary | ICD-10-CM | POA: Diagnosis not present

## 2021-10-28 DIAGNOSIS — M961 Postlaminectomy syndrome, not elsewhere classified: Secondary | ICD-10-CM | POA: Diagnosis not present

## 2021-10-28 DIAGNOSIS — M4726 Other spondylosis with radiculopathy, lumbar region: Secondary | ICD-10-CM | POA: Diagnosis not present

## 2021-10-28 NOTE — Chronic Care Management (AMB) (Signed)
Chronic Care Management Pharmacy Assistant   Name: Erin Petersen  MRN: [id number] DOB: 10/24/1940   Reason for Encounter: General Adherence    Recent office visits:  08/26/21-Erin Wilson,LPN(fam med)-Telemedicine-AWV,discussed screenings,vaccines,risks, no medication changes, f/u 1 year. 07/26/21-Erin Tower,MD(PCP)-ankle/feet swelling,Can consider arb but due to h/o anaphylaxis with bee stings ace/arb are not first choice.supp hose are an option ,planning major back surgery.No medication changes  Recent consult visits:  09/07/21-Erin Orgel,MD(cardio)-f/u HTN,referral for stress test, keep taking asa.No medication changes,f/u 3 weeks 08/19/21-Erin Torrealba,MD(ortho surg)-MRI Thoracic spine results. 08/09/21-Erin Khan,MD(pulmo)-using CPAP-recommend speech evaluation-f/u 1 year  06/22/21-Erin Morales,MD(PM&R)-Reviewed MRI,recommend surgical evaluation (neurosurgery)and physical therapy  06/01/21-Erin Orgel,MD(cardio)-f/u HTN,EKG,Echo,Increase amlodipine 10 mg daily,f/u 3 months 05/24/21-Erin Morales,MD(PM&R)-f/u back pain,start physical therapy f/u 2 weeks after injection. 05/16/21-Erin McDonough,PA(pulmo)-f/u test results,referral for sniff test  05/10/21-Erin Shah,MD(neuro)-Recommend beginning Alpha Lipoic Acid 600 mg a day-Recommend asking Dr. Humphrey Rolls about a diaphragmatic work-up.f/u 1 year   Hospital visits:  None in previous 6 months  Medications: Outpatient Encounter Medications as of 10/28/2021  Medication Sig   albuterol (VENTOLIN HFA) 108 (90 Base) MCG/ACT inhaler Inhale 2 puffs into the lungs every 6 (six) hours as needed for wheezing or shortness of breath.   Alpha-Lipoic Acid 600 MG CAPS Take 1 capsule by mouth daily.   amitriptyline (ELAVIL) 25 MG tablet TAKE 1 TABLET AT BEDTIME   amLODipine (NORVASC) 5 MG tablet Take 1 tablet (5 mg total) by mouth daily.   aspirin 81 MG EC tablet Take 81 mg by mouth at bedtime.    cephALEXin (KEFLEX) 500 MG capsule Take 500 mg by  mouth. PRN dentist appts for 1 year following knee replacement   Cholecalciferol (VITAMIN D3) 50 MCG (2000 UT) TABS Take 5,000 Units by mouth daily.   Cyanocobalamin (VITAMIN B-12) 1000 MCG SUBL Take 2,000 mcg by mouth daily.   dexlansoprazole (DEXILANT) 60 MG capsule Take 1 capsule (60 mg total) by mouth daily.   EPINEPHrine 0.3 mg/0.3 mL IJ SOAJ injection Inject 0.3 mg into the muscle as needed for anaphylaxis.   hydrochlorothiazide (HYDRODIURIL) 25 MG tablet TAKE 1 TABLET DAILY   meloxicam (MOBIC) 15 MG tablet TAKE 1 TABLET DAILY WITH FOOD AS NEEDED FOR PAIN   polyethylene glycol (MIRALAX / GLYCOLAX) 17 g packet Take 1 packet by mouth daily.    pregabalin (LYRICA) 150 MG capsule Take 1 capsule (150 mg total) by mouth 2 (two) times daily. (Patient taking differently: Take 150 mg by mouth See admin instructions. Take 150 mg by mouth in the morning and  300 mg in the afternoon)   rOPINIRole (REQUIP) 0.25 MG tablet Take 0.75 mg by mouth in the morning and at bedtime. One in am and one in afternoon   rOPINIRole (REQUIP) 4 MG tablet TAKE 1 TABLET AT BEDTIME   simvastatin (ZOCOR) 20 MG tablet TAKE 1 TABLET AT BEDTIME   No facility-administered encounter medications on file as of 10/28/2021.       Contacted Erin Petersen on 11/03/21 for general disease state and medication adherence call.   Patient is not more than 5 days past due for refill on the following medications per chart history:  [facility] Medications: Medication Name/mg Last Fill Days Supply Simvastatin 28m  10/26/21 90   What concerns do you have about your medications? No medication concerns   The patient denies side effects with their medications.   How often do you forget or accidentally miss a dose? Never  Do you use a pillbox? Yes  Are you  having any problems getting your medications from your pharmacy? No  Has the cost of your medications been a concern? No  Since last visit with CPP, no interventions have been made.  Patient reports being consumed with the back pain for months and now  having surgery so she can walk without pain and discomfort.  The patient has not had an ED visit since last contact.   The patient reports the following problems with their health. Severe scoliosis and DDD, Patient will have 2 back surgeries 11/14/21 and 12/14/21.  Patient denies concerns or questions for Erin Petersen, PharmD at this time.   Counseled patient on:  [place] job taking medications, Importance of taking medication daily without missed doses, Benefits of adherence packaging or a pillbox, and Access to CCM team for any cost, medication or pharmacy concerns.   Care Gaps: Annual wellness visit in last year? Yes Most Recent BP reading:146/76  70-P 09/07/21  Upcoming appointments: GI  01/03/22  Summary: CCM F/U visit -Pt is still experiencing poor sleep and is undergoing sleep evaluation (repeat sleep study later this week) -Pt is taking omeprazole every other day; she has not tried H2RA before; she has osteopenia and continued PPI use risks worsening bone density   Recommendations/Changes made from today's visit: -Recommend trial off PPI; replace with famotidine 20 mg daily or PRN  Erin Petersen, CPP notified  Erin Petersen, [place]  [phone]

## 2021-11-06 DIAGNOSIS — J019 Acute sinusitis, unspecified: Secondary | ICD-10-CM | POA: Diagnosis not present

## 2021-11-06 DIAGNOSIS — J209 Acute bronchitis, unspecified: Secondary | ICD-10-CM | POA: Diagnosis not present

## 2021-11-06 DIAGNOSIS — B9689 Other specified bacterial agents as the cause of diseases classified elsewhere: Secondary | ICD-10-CM | POA: Diagnosis not present

## 2021-11-06 DIAGNOSIS — Z03818 Encounter for observation for suspected exposure to other biological agents ruled out: Secondary | ICD-10-CM | POA: Diagnosis not present

## 2021-11-07 DIAGNOSIS — M961 Postlaminectomy syndrome, not elsewhere classified: Secondary | ICD-10-CM | POA: Diagnosis not present

## 2021-11-07 DIAGNOSIS — M4156 Other secondary scoliosis, lumbar region: Secondary | ICD-10-CM | POA: Diagnosis not present

## 2021-11-07 DIAGNOSIS — Z8673 Personal history of transient ischemic attack (TIA), and cerebral infarction without residual deficits: Secondary | ICD-10-CM | POA: Diagnosis not present

## 2021-11-07 DIAGNOSIS — Z01812 Encounter for preprocedural laboratory examination: Secondary | ICD-10-CM | POA: Diagnosis not present

## 2021-11-09 ENCOUNTER — Ambulatory Visit: Payer: Medicare Other | Admitting: Dermatology

## 2021-11-14 DIAGNOSIS — M47815 Spondylosis without myelopathy or radiculopathy, thoracolumbar region: Secondary | ICD-10-CM | POA: Diagnosis not present

## 2021-11-14 DIAGNOSIS — M48061 Spinal stenosis, lumbar region without neurogenic claudication: Secondary | ICD-10-CM | POA: Diagnosis not present

## 2021-11-14 DIAGNOSIS — M4306 Spondylolysis, lumbar region: Secondary | ICD-10-CM | POA: Diagnosis not present

## 2021-11-14 DIAGNOSIS — Z882 Allergy status to sulfonamides status: Secondary | ICD-10-CM | POA: Diagnosis not present

## 2021-11-14 DIAGNOSIS — Z9889 Other specified postprocedural states: Secondary | ICD-10-CM | POA: Diagnosis not present

## 2021-11-14 DIAGNOSIS — Z881 Allergy status to other antibiotic agents status: Secondary | ICD-10-CM | POA: Diagnosis not present

## 2021-11-14 DIAGNOSIS — I10 Essential (primary) hypertension: Secondary | ICD-10-CM | POA: Diagnosis not present

## 2021-11-14 DIAGNOSIS — Z79899 Other long term (current) drug therapy: Secondary | ICD-10-CM | POA: Diagnosis not present

## 2021-11-14 DIAGNOSIS — M961 Postlaminectomy syndrome, not elsewhere classified: Secondary | ICD-10-CM | POA: Diagnosis not present

## 2021-11-14 DIAGNOSIS — M48062 Spinal stenosis, lumbar region with neurogenic claudication: Secondary | ICD-10-CM | POA: Diagnosis not present

## 2021-11-14 DIAGNOSIS — Z7982 Long term (current) use of aspirin: Secondary | ICD-10-CM | POA: Diagnosis not present

## 2021-11-14 DIAGNOSIS — Z91030 Bee allergy status: Secondary | ICD-10-CM | POA: Diagnosis not present

## 2021-11-14 DIAGNOSIS — M5116 Intervertebral disc disorders with radiculopathy, lumbar region: Secondary | ICD-10-CM | POA: Diagnosis not present

## 2021-11-14 DIAGNOSIS — Z981 Arthrodesis status: Secondary | ICD-10-CM | POA: Diagnosis not present

## 2021-11-14 DIAGNOSIS — M4804 Spinal stenosis, thoracic region: Secondary | ICD-10-CM | POA: Diagnosis not present

## 2021-11-14 DIAGNOSIS — M4326 Fusion of spine, lumbar region: Secondary | ICD-10-CM | POA: Diagnosis not present

## 2021-11-14 DIAGNOSIS — M4156 Other secondary scoliosis, lumbar region: Secondary | ICD-10-CM | POA: Diagnosis not present

## 2021-11-14 DIAGNOSIS — R9431 Abnormal electrocardiogram [ECG] [EKG]: Secondary | ICD-10-CM | POA: Diagnosis not present

## 2021-11-14 HISTORY — PX: LUMBAR FUSION: SHX111

## 2021-11-15 DIAGNOSIS — K429 Umbilical hernia without obstruction or gangrene: Secondary | ICD-10-CM | POA: Insufficient documentation

## 2021-11-15 DIAGNOSIS — I10 Essential (primary) hypertension: Secondary | ICD-10-CM | POA: Insufficient documentation

## 2021-11-15 DIAGNOSIS — K4090 Unilateral inguinal hernia, without obstruction or gangrene, not specified as recurrent: Secondary | ICD-10-CM | POA: Insufficient documentation

## 2021-11-16 ENCOUNTER — Telehealth: Payer: Self-pay | Admitting: *Deleted

## 2021-11-16 NOTE — Patient Outreach (Signed)
  Care Coordination TOC Note Transition Care Management Follow-up Telephone Call Date of discharge and from where: [facility] [id number] How have you been since you were released from the hospital? Having a little problem with my leg but I know that the PT/OT will work with that Any questions or concerns? Yes Patient didn't know name and number of home health agency Items Reviewed: Did the pt receive and understand the discharge instructions provided? Yes  Medications obtained and verified? Yes  Other? No  Any new allergies since your discharge? No  Dietary orders reviewed? No Do you have support at home? Yes   Home Care and Equipment/Supplies: Were home health services ordered? yes If so, what is the name of the agency? [facility] set up a time to come to the patient's home? no Were any new equipment or medical supplies ordered?  N  What is the name of the medical supply agency? N  Were you able to get the supplies/equipment? not applicable Do you have any questions related to the use of the equipment or supplies? No  Functional Questionnaire: (I = Independent and D = Dependent) ADLs: d  Bathing/Dressing- d  Meal Prep- d  Eating- I  Maintaining continence- I  Transferring/Ambulation- I  Managing Meds- I  Follow up appointments reviewed:  PCP Hospital f/u appt confirmed? No   Specialist Hospital f/u appt confirmed? Yes  Scheduled to see  on Dr Sherlyn Lick on [phone] at 7:15   Are transportation arrangements needed? No  If their condition worsens, is the pt aware to call PCP or go to the Emergency Dept.? Yes Was the patient provided with contact information for the PCP's office or ED? Yes Was to pt encouraged to call back with questions or concerns? Yes  SDOH assessments and interventions completed:   Yes  Care Coordination Interventions Activated:  Yes   Care Coordination Interventions:  RN contacted Hospital care manager to get [facility] agency name and  number for patient    Encounter Outcome:  Pt. Visit Completed    [place] Management [phone]

## 2021-11-17 DIAGNOSIS — E559 Vitamin D deficiency, unspecified: Secondary | ICD-10-CM | POA: Diagnosis not present

## 2021-11-17 DIAGNOSIS — Z4789 Encounter for other orthopedic aftercare: Secondary | ICD-10-CM | POA: Diagnosis not present

## 2021-11-17 DIAGNOSIS — K589 Irritable bowel syndrome without diarrhea: Secondary | ICD-10-CM | POA: Diagnosis not present

## 2021-11-17 DIAGNOSIS — G2581 Restless legs syndrome: Secondary | ICD-10-CM | POA: Diagnosis not present

## 2021-11-17 DIAGNOSIS — K4090 Unilateral inguinal hernia, without obstruction or gangrene, not specified as recurrent: Secondary | ICD-10-CM | POA: Diagnosis not present

## 2021-11-17 DIAGNOSIS — J449 Chronic obstructive pulmonary disease, unspecified: Secondary | ICD-10-CM | POA: Diagnosis not present

## 2021-11-17 DIAGNOSIS — I272 Pulmonary hypertension, unspecified: Secondary | ICD-10-CM | POA: Diagnosis not present

## 2021-11-17 DIAGNOSIS — M961 Postlaminectomy syndrome, not elsewhere classified: Secondary | ICD-10-CM | POA: Diagnosis not present

## 2021-11-17 DIAGNOSIS — M4726 Other spondylosis with radiculopathy, lumbar region: Secondary | ICD-10-CM | POA: Diagnosis not present

## 2021-11-17 DIAGNOSIS — M5431 Sciatica, right side: Secondary | ICD-10-CM | POA: Diagnosis not present

## 2021-11-17 DIAGNOSIS — I10 Essential (primary) hypertension: Secondary | ICD-10-CM | POA: Diagnosis not present

## 2021-11-17 DIAGNOSIS — M810 Age-related osteoporosis without current pathological fracture: Secondary | ICD-10-CM | POA: Diagnosis not present

## 2021-11-17 DIAGNOSIS — K5909 Other constipation: Secondary | ICD-10-CM | POA: Diagnosis not present

## 2021-11-17 DIAGNOSIS — M1990 Unspecified osteoarthritis, unspecified site: Secondary | ICD-10-CM | POA: Diagnosis not present

## 2021-11-17 DIAGNOSIS — G43109 Migraine with aura, not intractable, without status migrainosus: Secondary | ICD-10-CM | POA: Diagnosis not present

## 2021-11-17 DIAGNOSIS — J309 Allergic rhinitis, unspecified: Secondary | ICD-10-CM | POA: Diagnosis not present

## 2021-11-17 DIAGNOSIS — M48061 Spinal stenosis, lumbar region without neurogenic claudication: Secondary | ICD-10-CM | POA: Diagnosis not present

## 2021-11-17 DIAGNOSIS — M5432 Sciatica, left side: Secondary | ICD-10-CM | POA: Diagnosis not present

## 2021-11-17 DIAGNOSIS — E785 Hyperlipidemia, unspecified: Secondary | ICD-10-CM | POA: Diagnosis not present

## 2021-11-17 DIAGNOSIS — I2510 Atherosclerotic heart disease of native coronary artery without angina pectoris: Secondary | ICD-10-CM | POA: Diagnosis not present

## 2021-11-17 DIAGNOSIS — G629 Polyneuropathy, unspecified: Secondary | ICD-10-CM | POA: Diagnosis not present

## 2021-11-17 DIAGNOSIS — M4010 Other secondary kyphosis, site unspecified: Secondary | ICD-10-CM | POA: Diagnosis not present

## 2021-11-17 DIAGNOSIS — K429 Umbilical hernia without obstruction or gangrene: Secondary | ICD-10-CM | POA: Diagnosis not present

## 2021-11-17 DIAGNOSIS — K219 Gastro-esophageal reflux disease without esophagitis: Secondary | ICD-10-CM | POA: Diagnosis not present

## 2021-11-17 DIAGNOSIS — M4156 Other secondary scoliosis, lumbar region: Secondary | ICD-10-CM | POA: Diagnosis not present

## 2021-11-24 DIAGNOSIS — G4733 Obstructive sleep apnea (adult) (pediatric): Secondary | ICD-10-CM | POA: Diagnosis not present

## 2021-11-24 DIAGNOSIS — M4326 Fusion of spine, lumbar region: Secondary | ICD-10-CM | POA: Diagnosis not present

## 2021-12-01 ENCOUNTER — Other Ambulatory Visit: Payer: Self-pay | Admitting: Orthopedic Surgery

## 2021-12-01 ENCOUNTER — Other Ambulatory Visit (HOSPITAL_COMMUNITY): Payer: Self-pay | Admitting: Orthopedic Surgery

## 2021-12-01 DIAGNOSIS — G8929 Other chronic pain: Secondary | ICD-10-CM | POA: Diagnosis not present

## 2021-12-01 DIAGNOSIS — J4489 Other specified chronic obstructive pulmonary disease: Secondary | ICD-10-CM | POA: Diagnosis not present

## 2021-12-01 DIAGNOSIS — K429 Umbilical hernia without obstruction or gangrene: Secondary | ICD-10-CM | POA: Diagnosis not present

## 2021-12-01 DIAGNOSIS — E7800 Pure hypercholesterolemia, unspecified: Secondary | ICD-10-CM | POA: Diagnosis not present

## 2021-12-01 DIAGNOSIS — K219 Gastro-esophageal reflux disease without esophagitis: Secondary | ICD-10-CM | POA: Diagnosis not present

## 2021-12-01 DIAGNOSIS — M5416 Radiculopathy, lumbar region: Secondary | ICD-10-CM | POA: Diagnosis not present

## 2021-12-01 DIAGNOSIS — M4805 Spinal stenosis, thoracolumbar region: Secondary | ICD-10-CM | POA: Diagnosis not present

## 2021-12-01 DIAGNOSIS — Z981 Arthrodesis status: Secondary | ICD-10-CM | POA: Diagnosis not present

## 2021-12-01 DIAGNOSIS — M4326 Fusion of spine, lumbar region: Secondary | ICD-10-CM | POA: Diagnosis not present

## 2021-12-01 DIAGNOSIS — R682 Dry mouth, unspecified: Secondary | ICD-10-CM | POA: Diagnosis not present

## 2021-12-01 DIAGNOSIS — M48061 Spinal stenosis, lumbar region without neurogenic claudication: Secondary | ICD-10-CM | POA: Diagnosis not present

## 2021-12-01 DIAGNOSIS — M8008XA Age-related osteoporosis with current pathological fracture, vertebra(e), initial encounter for fracture: Secondary | ICD-10-CM | POA: Diagnosis not present

## 2021-12-01 DIAGNOSIS — I700 Atherosclerosis of aorta: Secondary | ICD-10-CM | POA: Diagnosis not present

## 2021-12-01 DIAGNOSIS — M545 Low back pain, unspecified: Secondary | ICD-10-CM | POA: Diagnosis not present

## 2021-12-01 DIAGNOSIS — M5136 Other intervertebral disc degeneration, lumbar region: Secondary | ICD-10-CM | POA: Diagnosis not present

## 2021-12-01 DIAGNOSIS — I10 Essential (primary) hypertension: Secondary | ICD-10-CM | POA: Diagnosis not present

## 2021-12-01 DIAGNOSIS — D649 Anemia, unspecified: Secondary | ICD-10-CM | POA: Diagnosis not present

## 2021-12-01 DIAGNOSIS — G4733 Obstructive sleep apnea (adult) (pediatric): Secondary | ICD-10-CM | POA: Diagnosis not present

## 2021-12-01 DIAGNOSIS — M47816 Spondylosis without myelopathy or radiculopathy, lumbar region: Secondary | ICD-10-CM | POA: Diagnosis not present

## 2021-12-01 DIAGNOSIS — M5126 Other intervertebral disc displacement, lumbar region: Secondary | ICD-10-CM | POA: Diagnosis not present

## 2021-12-02 DIAGNOSIS — M545 Low back pain, unspecified: Secondary | ICD-10-CM | POA: Diagnosis not present

## 2021-12-02 DIAGNOSIS — G8929 Other chronic pain: Secondary | ICD-10-CM | POA: Diagnosis not present

## 2021-12-02 DIAGNOSIS — R1032 Left lower quadrant pain: Secondary | ICD-10-CM | POA: Diagnosis not present

## 2021-12-02 DIAGNOSIS — M4856XA Collapsed vertebra, not elsewhere classified, lumbar region, initial encounter for fracture: Secondary | ICD-10-CM | POA: Diagnosis not present

## 2021-12-06 DIAGNOSIS — M5416 Radiculopathy, lumbar region: Secondary | ICD-10-CM | POA: Diagnosis not present

## 2021-12-14 DIAGNOSIS — M5416 Radiculopathy, lumbar region: Secondary | ICD-10-CM | POA: Diagnosis not present

## 2021-12-14 DIAGNOSIS — M48061 Spinal stenosis, lumbar region without neurogenic claudication: Secondary | ICD-10-CM | POA: Diagnosis not present

## 2021-12-14 DIAGNOSIS — M48062 Spinal stenosis, lumbar region with neurogenic claudication: Secondary | ICD-10-CM | POA: Diagnosis not present

## 2021-12-14 DIAGNOSIS — M4156 Other secondary scoliosis, lumbar region: Secondary | ICD-10-CM | POA: Diagnosis not present

## 2021-12-14 DIAGNOSIS — M4856XA Collapsed vertebra, not elsewhere classified, lumbar region, initial encounter for fracture: Secondary | ICD-10-CM | POA: Diagnosis not present

## 2021-12-14 DIAGNOSIS — Z981 Arthrodesis status: Secondary | ICD-10-CM | POA: Diagnosis not present

## 2021-12-14 DIAGNOSIS — Z7982 Long term (current) use of aspirin: Secondary | ICD-10-CM | POA: Diagnosis not present

## 2021-12-14 DIAGNOSIS — M4326 Fusion of spine, lumbar region: Secondary | ICD-10-CM | POA: Diagnosis not present

## 2021-12-14 DIAGNOSIS — K219 Gastro-esophageal reflux disease without esophagitis: Secondary | ICD-10-CM | POA: Diagnosis not present

## 2021-12-14 DIAGNOSIS — K589 Irritable bowel syndrome without diarrhea: Secondary | ICD-10-CM | POA: Diagnosis not present

## 2021-12-14 DIAGNOSIS — M961 Postlaminectomy syndrome, not elsewhere classified: Secondary | ICD-10-CM | POA: Diagnosis not present

## 2021-12-14 DIAGNOSIS — M4726 Other spondylosis with radiculopathy, lumbar region: Secondary | ICD-10-CM | POA: Diagnosis not present

## 2021-12-14 DIAGNOSIS — I10 Essential (primary) hypertension: Secondary | ICD-10-CM | POA: Diagnosis not present

## 2021-12-14 DIAGNOSIS — Z0389 Encounter for observation for other suspected diseases and conditions ruled out: Secondary | ICD-10-CM | POA: Diagnosis not present

## 2021-12-14 DIAGNOSIS — Z9889 Other specified postprocedural states: Secondary | ICD-10-CM | POA: Diagnosis not present

## 2021-12-14 DIAGNOSIS — M4306 Spondylolysis, lumbar region: Secondary | ICD-10-CM | POA: Diagnosis not present

## 2021-12-14 DIAGNOSIS — M963 Postlaminectomy kyphosis: Secondary | ICD-10-CM | POA: Diagnosis not present

## 2021-12-14 DIAGNOSIS — M4804 Spinal stenosis, thoracic region: Secondary | ICD-10-CM | POA: Diagnosis not present

## 2021-12-14 HISTORY — PX: LUMBAR FUSION: SHX111

## 2021-12-20 DIAGNOSIS — M961 Postlaminectomy syndrome, not elsewhere classified: Secondary | ICD-10-CM | POA: Diagnosis not present

## 2021-12-20 DIAGNOSIS — M48061 Spinal stenosis, lumbar region without neurogenic claudication: Secondary | ICD-10-CM | POA: Diagnosis not present

## 2021-12-20 DIAGNOSIS — M5431 Sciatica, right side: Secondary | ICD-10-CM | POA: Diagnosis not present

## 2021-12-20 DIAGNOSIS — I2510 Atherosclerotic heart disease of native coronary artery without angina pectoris: Secondary | ICD-10-CM | POA: Diagnosis not present

## 2021-12-20 DIAGNOSIS — M4156 Other secondary scoliosis, lumbar region: Secondary | ICD-10-CM | POA: Diagnosis not present

## 2021-12-20 DIAGNOSIS — K5909 Other constipation: Secondary | ICD-10-CM | POA: Diagnosis not present

## 2021-12-20 DIAGNOSIS — M5432 Sciatica, left side: Secondary | ICD-10-CM | POA: Diagnosis not present

## 2021-12-20 DIAGNOSIS — K219 Gastro-esophageal reflux disease without esophagitis: Secondary | ICD-10-CM | POA: Diagnosis not present

## 2021-12-20 DIAGNOSIS — K4090 Unilateral inguinal hernia, without obstruction or gangrene, not specified as recurrent: Secondary | ICD-10-CM | POA: Diagnosis not present

## 2021-12-20 DIAGNOSIS — M4010 Other secondary kyphosis, site unspecified: Secondary | ICD-10-CM | POA: Diagnosis not present

## 2021-12-20 DIAGNOSIS — I272 Pulmonary hypertension, unspecified: Secondary | ICD-10-CM | POA: Diagnosis not present

## 2021-12-20 DIAGNOSIS — I10 Essential (primary) hypertension: Secondary | ICD-10-CM | POA: Diagnosis not present

## 2021-12-20 DIAGNOSIS — G43109 Migraine with aura, not intractable, without status migrainosus: Secondary | ICD-10-CM | POA: Diagnosis not present

## 2021-12-20 DIAGNOSIS — Z4789 Encounter for other orthopedic aftercare: Secondary | ICD-10-CM | POA: Diagnosis not present

## 2021-12-20 DIAGNOSIS — M4726 Other spondylosis with radiculopathy, lumbar region: Secondary | ICD-10-CM | POA: Diagnosis not present

## 2021-12-20 DIAGNOSIS — K429 Umbilical hernia without obstruction or gangrene: Secondary | ICD-10-CM | POA: Diagnosis not present

## 2021-12-28 ENCOUNTER — Ambulatory Visit (INDEPENDENT_AMBULATORY_CARE_PROVIDER_SITE_OTHER): Payer: Medicare Other | Admitting: Family Medicine

## 2021-12-28 ENCOUNTER — Encounter: Payer: Self-pay | Admitting: Family Medicine

## 2021-12-28 VITALS — BP 108/56 | HR 77 | Temp 97.5°F | Ht <= 58 in | Wt 183.4 lb

## 2021-12-28 DIAGNOSIS — G8918 Other acute postprocedural pain: Secondary | ICD-10-CM | POA: Insufficient documentation

## 2021-12-28 DIAGNOSIS — K5903 Drug induced constipation: Secondary | ICD-10-CM | POA: Diagnosis not present

## 2021-12-28 DIAGNOSIS — I10 Essential (primary) hypertension: Secondary | ICD-10-CM | POA: Diagnosis not present

## 2021-12-28 DIAGNOSIS — M961 Postlaminectomy syndrome, not elsewhere classified: Secondary | ICD-10-CM | POA: Insufficient documentation

## 2021-12-28 NOTE — Assessment & Plan Note (Signed)
Revision of lumbar surgery/laminectomy and fusion on 9/18 and 12/14/21  Reviewed limited info from [facility] Unfortunately had set backs after both surgeries and ins would not pay for inpt rehab so she has had home health and sister staying with her  Gradual improvement On oxycodone and needs further adv on weaning , poss change to hydrocodone (which she has from first surgery)  inst her to call her surg office She has appt on 11/16 but may need to be seen prior  Commended progress so far  Enc her to continue PT Walking with walker today

## 2021-12-28 NOTE — Progress Notes (Signed)
Subjective:    Patient ID: Erin Petersen, female    DOB: Aug 15, 1940, 81 y.o.   MRN: [id number]  HPI Pt presents for f/u after back surgery   Has had a difficult time from both surgeries  Had to be hospitalized in between for more severe pain and was tx with anti inflammatory tx     Wt Readings from Last 3 Encounters:  12/28/21 183 lb 6 oz (83.2 kg)  08/26/21 173 lb 8 oz (78.7 kg)  08/09/21 177 lb 6.4 oz (80.5 kg)   38.33 kg/m  Had revision of lumbar laminectomy and fusion   (2 surg in sept then oct )  Recovery is quite slow  Was supposed to go to rehab but insurance did not pay  Frustrating  She was able to walk but not wipe/toilet herself -very difficult  Daughter came home with her and a sister from [place] came home   [place] is coming out  Is doing some physical therapy and OT  Wearing a back brace   Pain- has oxycodone with tylenol- 10-325 mg  She takes it avg of 4 times daily Also some hydrocodone (left over from first surg)  Follow up with surgeon is nov 16th   Is getting constipation from the narcotic  Taking miralax and has some stool softener  Bms are not decent   Taking lyrica -went up to 200 mg three times daily  Also reqip   bp is stable today  No cp or palpitations or headaches or edema  No side effects to medicines  BP Readings from Last 3 Encounters:  12/28/21 (!) 108/56  08/09/21 126/60  07/26/21 139/66    Amlodipine 5 mg  Hctz 25 mg daily   Lab Results  Component Value Date   CREATININE 0.71 10/21/2020   BUN 14 10/21/2020   NA 130 (L) 10/21/2020   K 3.6 10/21/2020   CL 93 (L) 10/21/2020   CO2 25 10/21/2020   Patient Active Problem List   Diagnosis Date Noted   Constipation due to pain medication 12/28/2021   Post-operative pain 12/28/2021   Pedal edema 07/26/2021   Mild pulmonary hypertension ([place]) 07/26/2021   Hyperlipidemia 11/17/2020   S/P TKR (total knee replacement) using cement, left 10/12/2020   Somnolence, daytime  09/09/2020   Rash 09/09/2020   Medicare annual wellness visit, subsequent 08/25/2020   Encounter for screening mammogram for breast cancer 08/25/2020   Encounter for general adult medical examination with abnormal findings 08/25/2020   GERD (gastroesophageal reflux disease) 08/25/2020   Dizzy spells 08/25/2020   Migraine 03/12/2020   IBS (irritable bowel syndrome) 03/12/2020   S/P TKR (total knee replacement) using cement, right 01/01/2020   History of vaginal hysterectomy 06/11/2019   Estrogen deficiency 04/22/2019   History of skin cancer 04/22/2019   High-tone pelvic floor dysfunction 04/03/2019   IC (interstitial cystitis) 04/03/2019   Neuralgia of both pudendal nerves 04/03/2019   Pelvic pain in female 04/03/2019   Osteoporosis 02/12/2019   Asthma 01/31/2019   Adverse reaction to influenza vaccine, initial encounter 12/03/2018   Vitamin D deficiency 12/03/2018   Restless legs syndrome 11/28/2018   Small fiber neuropathy 11/28/2018   Allergic rhinitis 10/08/2018   Benign essential hypertension 10/08/2018   Chronic bladder pain 10/08/2018   Neuropathy 10/08/2018   Osteoarthritis 10/08/2018   S/P BSO (bilateral salpingo-oophorectomy) 01/07/2018   OSA on CPAP 12/29/2016   Chronic dyspnea 06/24/2012   Other and unspecified disc disorder of lumbar region 10/25/2000  Prediabetes 09/24/1998   Tinnitus of both ears 09/24/1983   Past Medical History:  Diagnosis Date   Arthritis    Asthma    exercise induced asthma   Bacteremia due to Gram-negative bacteria 06/04/2019   Colon polyps    Diabetes mellitus without complication (HCC)    Duodenal ulcer    Dyspnea    GERD (gastroesophageal reflux disease)    Hyperlipidemia    Hypertension    Neuropathy    right leg and foot   Plantar fasciitis    Pre-diabetes    Prediabetes    RLQ abdominal pain    RLS (restless legs syndrome)    Sleep apnea    CPAP   SOB (shortness of breath)    Urinary incontinence    Urinary tract  infection    Wears hearing aid in right ear    Past Surgical History:  Procedure Laterality Date   [place]  2002   lumbar fusion   BILATERAL SALPINGOOPHORECTOMY  01/07/2018   BIOPSY  04/15/2019   Procedure: BIOPSY;  Surgeon: [name], MD;  Location: [place];  Service: Endoscopy;;   CARDIAC CATHETERIZATION  2019   COLONOSCOPY WITH PROPOFOL N/A 11/11/2018   Procedure: COLONOSCOPY WITH PROPOFOL;  Surgeon: [name], MD;  Location: [facility];  Service: Gastroenterology;  Laterality: N/A;   CYSTO WITH HYDRODISTENSION N/A 12/30/2018   Procedure: CYSTOSCOPY/HYDRODISTENSION WITH MARCAINE;  Surgeon: Bjorn Loser, MD;  Location: [facility];  Service: Urology;  Laterality: N/A;   ESOPHAGOGASTRODUODENOSCOPY (EGD) WITH PROPOFOL N/A 04/15/2019   Procedure: ESOPHAGOGASTRODUODENOSCOPY (EGD) WITH PROPOFOL;  Surgeon: [name], MD;  Location: [place];  Service: Endoscopy;  Laterality: N/A;  sleep apnea   ESOPHAGOGASTRODUODENOSCOPY (EGD) WITH PROPOFOL N/A 07/09/2019   Procedure: ESOPHAGOGASTRODUODENOSCOPY (EGD) WITH PROPOFOL;  Surgeon: [name], MD;  Location: [place];  Service: Gastroenterology;  Laterality: N/A;   EYE SURGERY Bilateral    HERNIA REPAIR  2018   abd   HERNIA REPAIR     umbilical   INGUINAL HERNIA REPAIR Bilateral    inguinal   JOINT REPLACEMENT     LAPAROSCOPIC BILATERAL SALPINGO OOPHERECTOMY  2019   SPINAL FUSION  2002   TONSILLECTOMY AND ADENOIDECTOMY  1961   TOTAL KNEE ARTHROPLASTY Right 01/01/2020   Procedure: Right total knee arthroplasty - [name] to Assist;  Surgeon: Hessie Knows, MD;  Location: [facility];  Service: Orthopedics;  Laterality: Right;   TOTAL KNEE ARTHROPLASTY Left 10/12/2020   Procedure: TOTAL KNEE ARTHROPLASTY;  Surgeon: Hessie Knows, MD;  Location: [facility];  Service: Orthopedics;  Laterality: Left;   TUBAL LIGATION      Social History   Tobacco Use   Smoking status: Never   Smokeless tobacco: Never  Vaping Use   Vaping Use: Never used  Substance Use Topics   Alcohol use: Not Currently   Drug use: Never   Family History  Problem Relation Age of Onset   Alcohol abuse Mother    Cancer Mother    Stroke Mother    Alcohol abuse Father    Arthritis Sister    Arthritis Daughter    COPD Daughter    Arthritis Paternal Grandmother    Asthma Paternal Grandmother    COPD Paternal Grandmother    Breast cancer Paternal Aunt    Allergies  Allergen Reactions   Bee Venom Anaphylaxis and Hives   Fosamax [Alendronate] Nausea Only  Ciprofloxacin Hcl Rash    Mild rash after long term use   Sulfa Antibiotics Rash    Bactrim   Current Outpatient Medications on File Prior to Visit  Medication Sig Dispense Refill   albuterol (VENTOLIN HFA) 108 (90 Base) MCG/ACT inhaler Inhale 2 puffs into the lungs every 6 (six) hours as needed for wheezing or shortness of breath. 2 each 2   Alpha-Lipoic Acid 600 MG CAPS Take 1 capsule by mouth daily.     amitriptyline (ELAVIL) 25 MG tablet TAKE 1 TABLET AT BEDTIME 365 tablet 0   amLODipine (NORVASC) 5 MG tablet Take 1 tablet (5 mg total) by mouth daily. 1 tablet 0   aspirin 81 MG EC tablet Take 81 mg by mouth at bedtime.      cephALEXin (KEFLEX) 500 MG capsule Take 500 mg by mouth. PRN dentist appts for 1 year following knee replacement     Cholecalciferol (VITAMIN D3) 50 MCG (2000 UT) TABS Take 5,000 Units by mouth daily.     Cyanocobalamin (VITAMIN B-12) 1000 MCG SUBL Take 2,000 mcg by mouth daily.     dexlansoprazole (DEXILANT) 60 MG capsule Take 1 capsule (60 mg total) by mouth daily. 90 capsule 0   EPINEPHrine 0.3 mg/0.3 mL IJ SOAJ injection Inject 0.3 mg into the muscle as needed for anaphylaxis.     hydrochlorothiazide (HYDRODIURIL) 25 MG tablet TAKE 1 TABLET DAILY 90 tablet 3   meloxicam (MOBIC) 15 MG tablet TAKE 1 TABLET DAILY WITH FOOD AS NEEDED FOR PAIN 90  tablet 3   oxyCODONE-acetaminophen (PERCOCET) 10-325 MG tablet Take 1 tablet by mouth every 4 (four) hours as needed for pain.     polyethylene glycol (MIRALAX / GLYCOLAX) 17 g packet Take 1 packet by mouth daily.      pregabalin (LYRICA) 200 MG capsule Take 1 capsule by mouth in the morning, at noon, and at bedtime.     rOPINIRole (REQUIP) 1 MG tablet Take 1 mg by mouth in the morning and at bedtime.     rOPINIRole (REQUIP) 4 MG tablet TAKE 1 TABLET AT BEDTIME 90 tablet 3   simvastatin (ZOCOR) 20 MG tablet TAKE 1 TABLET AT BEDTIME 90 tablet 3   No current facility-administered medications on file prior to visit.     Review of Systems  Constitutional:  Positive for fatigue. Negative for activity change, appetite change, fever and unexpected weight change.  HENT:  Negative for congestion, ear pain, rhinorrhea, sinus pressure and sore throat.   Eyes:  Negative for pain, redness and visual disturbance.  Respiratory:  Negative for cough, shortness of breath and wheezing.   Cardiovascular:  Negative for chest pain and palpitations.  Gastrointestinal:  Negative for abdominal pain, blood in stool, constipation and diarrhea.  Endocrine: Negative for polydipsia and polyuria.  Genitourinary:  Negative for dysuria, frequency and urgency.  Musculoskeletal:  Positive for back pain. Negative for arthralgias and myalgias.  Skin:  Negative for pallor and rash.  Allergic/Immunologic: Negative for environmental allergies.  Neurological:  Positive for weakness and numbness. Negative for dizziness, syncope and headaches.  Hematological:  Negative for adenopathy. Does not bruise/bleed easily.  Psychiatric/Behavioral:  Negative for decreased concentration and dysphoric mood. The patient is not nervous/anxious.        Objective:   Physical Exam Constitutional:      General: She is not in acute distress.    Appearance: Normal appearance. She is well-developed. She is obese. She is not ill-appearing or  diaphoretic.  HENT:  Head: Normocephalic and atraumatic.  Eyes:     Conjunctiva/sclera: Conjunctivae normal.     Pupils: Pupils are equal, round, and reactive to light.  Neck:     Thyroid: No thyromegaly.     Vascular: No carotid bruit or JVD.  Cardiovascular:     Rate and Rhythm: Normal rate and regular rhythm.     Heart sounds: Normal heart sounds.     No gallop.  Pulmonary:     Effort: Pulmonary effort is normal. No respiratory distress.     Breath sounds: Normal breath sounds. No wheezing or rales.  Abdominal:     General: There is no distension or abdominal bruit.     Palpations: Abdomen is soft.  Musculoskeletal:     Cervical back: Normal range of motion and neck supple.     Right lower leg: No edema.     Left lower leg: No edema.     Comments: Wearing a back brace Very limited rom   Steady slow gait with walker   Lymphadenopathy:     Cervical: No cervical adenopathy.  Skin:    General: Skin is warm and dry.     Coloration: Skin is not pale.     Findings: No rash.  Neurological:     Mental Status: She is alert.     Cranial Nerves: No cranial nerve deficit.     Coordination: Coordination normal.     Deep Tendon Reflexes: Reflexes are normal and symmetric. Reflexes normal.     Comments: Generalized weakness  Psychiatric:        Mood and Affect: Mood normal.           Assessment & Plan:   Problem List Items Addressed This Visit       Cardiovascular and Mediastinum   Benign essential hypertension    bp is stable /lower post op  No dizziness  bp in fair control at this time  BP Readings from Last 1 Encounters:  12/28/21 (!) 108/56   No changes needed Most recent labs reviewed  Disc lifstyle change with low sodium diet and exercise  Will continue  Amlodipine 5 mg daily  hctz 25 mg daily         Digestive   Constipation due to pain medication    Taking oxycodone, hydrocodone post op  Disc imp of fluid intake  Fiber when able  Recommend inc  miralax to tid until more regular bms then titrate as needed  Hope to be weaning narcotic medication soon         Other   Post-operative pain - Primary    Revision of lumbar surgery/laminectomy and fusion on 9/18 and 12/14/21  [facility] Unfortunately had set backs after both surgeries and ins would not pay for inpt rehab so she has had home health and sister staying with her  Gradual improvement On oxycodone and needs further adv on weaning , poss change to hydrocodone (which she has from first surgery)  inst her to call her surg office She has appt on 11/16 but may need to be seen prior  Commended progress so far  Enc her to continue PT Walking with walker today

## 2021-12-28 NOTE — Patient Instructions (Addendum)
Call your surgeon to discuss a plan for post op pain treatment plan- (likely transition to hydrocodone and wean off of it)  Let them know if you think you need to be seen earlier than 11/16  Go up on miralax to three times daily until bowels improve  Use other things as needed  You need to work on fluid intake as well   Get your flu shot at your pharmacy when you are ready   Keep up the good work!

## 2021-12-28 NOTE — Assessment & Plan Note (Signed)
Taking oxycodone, hydrocodone post op  Disc imp of fluid intake  Fiber when able  Recommend inc miralax to tid until more regular bms then titrate as needed  Hope to be weaning narcotic medication soon

## 2021-12-28 NOTE — Assessment & Plan Note (Signed)
bp is stable /lower post op  No dizziness  bp in fair control at this time  BP Readings from Last 1 Encounters:  12/28/21 (!) 108/56   No changes needed Most recent labs reviewed  Disc lifstyle change with low sodium diet and exercise  Will continue  Amlodipine 5 mg daily  hctz 25 mg daily

## 2021-12-29 ENCOUNTER — Encounter: Payer: Self-pay | Admitting: Nurse Practitioner

## 2021-12-29 ENCOUNTER — Ambulatory Visit: Payer: Medicare Other | Admitting: Nurse Practitioner

## 2021-12-29 VITALS — BP 113/57 | HR 73 | Temp 97.3°F | Resp 16 | Ht <= 58 in | Wt 182.8 lb

## 2021-12-29 DIAGNOSIS — G4733 Obstructive sleep apnea (adult) (pediatric): Secondary | ICD-10-CM | POA: Diagnosis not present

## 2021-12-29 DIAGNOSIS — Z7189 Other specified counseling: Secondary | ICD-10-CM | POA: Diagnosis not present

## 2021-12-29 NOTE — Progress Notes (Signed)
[facility] [address], [place] [address]  Internal MEDICINE  Office Visit Note  Patient Name: Erin Petersen  [id number]  [id number]  Date of Service: 12/29/2021  Chief Complaint  Patient presents with   Follow-up   Diabetes   Gastroesophageal Reflux   Hyperlipidemia   Hypertension    HPI [name] presents for a follow up visit for OSA and CPAP use .  Cpap 70% compliant Faxed over report 2 hospital stays with back surgery in the past couple of months.  Back on CPAP now that she is out of the hospital.  Does not need any supplies at this time.  Has no questions or issues    Current Medication: Outpatient Encounter Medications as of 12/29/2021  Medication Sig   albuterol (VENTOLIN HFA) 108 (90 Base) MCG/ACT inhaler Inhale 2 puffs into the lungs every 6 (six) hours as needed for wheezing or shortness of breath.   amitriptyline (ELAVIL) 25 MG tablet TAKE 1 TABLET AT BEDTIME   amLODipine (NORVASC) 5 MG tablet Take 1 tablet (5 mg total) by mouth daily.   aspirin 81 MG EC tablet Take 81 mg by mouth at bedtime.    cephALEXin (KEFLEX) 500 MG capsule Take 500 mg by mouth. PRN dentist appts for 1 year following knee replacement   Cholecalciferol (VITAMIN D3) 50 MCG (2000 UT) TABS Take 5,000 Units by mouth daily.   Cyanocobalamin (VITAMIN B-12) 1000 MCG SUBL Take 2,000 mcg by mouth daily.   EPINEPHrine 0.3 mg/0.3 mL IJ SOAJ injection Inject 0.3 mg into the muscle as needed for anaphylaxis.   hydrochlorothiazide (HYDRODIURIL) 25 MG tablet TAKE 1 TABLET DAILY   meloxicam (MOBIC) 15 MG tablet TAKE 1 TABLET DAILY WITH FOOD AS NEEDED FOR PAIN   oxyCODONE-acetaminophen (PERCOCET) 10-325 MG tablet Take 1 tablet by mouth every 4 (four) hours as needed for pain.   polyethylene glycol (MIRALAX / GLYCOLAX) 17 g packet Take 1 packet by mouth daily.    rOPINIRole (REQUIP) 1 MG tablet Take 1 mg by mouth in the morning and at bedtime.   rOPINIRole (REQUIP) 4 MG tablet TAKE 1  TABLET AT BEDTIME   simvastatin (ZOCOR) 20 MG tablet TAKE 1 TABLET AT BEDTIME   [DISCONTINUED] Alpha-Lipoic Acid 600 MG CAPS Take 1 capsule by mouth daily.   [DISCONTINUED] dexlansoprazole (DEXILANT) 60 MG capsule Take 1 capsule (60 mg total) by mouth daily. (Patient not taking: Reported on 01/03/2022)   [DISCONTINUED] pregabalin (LYRICA) 200 MG capsule Take 1 capsule by mouth in the morning, at noon, and at bedtime.   No facility-administered encounter medications on file as of 12/29/2021.    Surgical History: Past Surgical History:  Procedure Laterality Date   ABDOMINAL HYSTERECTOMY  [place] [facility]  2002   lumbar fusion   BILATERAL SALPINGOOPHORECTOMY  01/07/2018   BIOPSY  04/15/2019   Procedure: BIOPSY;  Surgeon: [name], MD;  Location: [place];  Service: Endoscopy;;   CARDIAC CATHETERIZATION  2019   COLONOSCOPY WITH PROPOFOL N/A 11/11/2018   Procedure: COLONOSCOPY WITH PROPOFOL;  Surgeon: [name], MD;  Location: [facility];  Service: Gastroenterology;  Laterality: N/A;   CYSTO WITH HYDRODISTENSION N/A 12/30/2018   Procedure: CYSTOSCOPY/HYDRODISTENSION WITH MARCAINE;  Surgeon: Bjorn Loser, MD;  Location: [facility];  Service: Urology;  Laterality: N/A;   ESOPHAGOGASTRODUODENOSCOPY (EGD) WITH PROPOFOL N/A 04/15/2019   Procedure: ESOPHAGOGASTRODUODENOSCOPY (EGD) WITH PROPOFOL;  Surgeon: [name], MD;  Location: [place]  [facility];  Service: Endoscopy;  Laterality: N/A;  sleep apnea   ESOPHAGOGASTRODUODENOSCOPY (EGD) WITH PROPOFOL N/A 07/09/2019   Procedure: ESOPHAGOGASTRODUODENOSCOPY (EGD) WITH PROPOFOL;  Surgeon: [name], MD;  Location: [place];  Service: Gastroenterology;  Laterality: N/A;   EYE SURGERY Bilateral    HERNIA REPAIR  2018   abd   HERNIA REPAIR     umbilical   INGUINAL HERNIA REPAIR Bilateral    inguinal   JOINT REPLACEMENT     LAPAROSCOPIC BILATERAL SALPINGO  OOPHERECTOMY  2019   SPINAL FUSION  2002   TONSILLECTOMY AND ADENOIDECTOMY  1961   TOTAL KNEE ARTHROPLASTY Right 01/01/2020   Procedure: Right total knee arthroplasty - [name] to Assist;  Surgeon: Hessie Knows, MD;  Location: [facility];  Service: Orthopedics;  Laterality: Right;   TOTAL KNEE ARTHROPLASTY Left 10/12/2020   Procedure: TOTAL KNEE ARTHROPLASTY;  Surgeon: Hessie Knows, MD;  Location: [facility];  Service: Orthopedics;  Laterality: Left;   TUBAL LIGATION      Medical History: Past Medical History:  Diagnosis Date   Arthritis    Asthma    exercise induced asthma   Bacteremia due to Gram-negative bacteria 06/04/2019   Colon polyps    Diabetes mellitus without complication (HCC)    Duodenal ulcer    Dyspnea    GERD (gastroesophageal reflux disease)    Hyperlipidemia    Hypertension    Neuropathy    right leg and foot   Plantar fasciitis    Pre-diabetes    Prediabetes    RLQ abdominal pain    RLS (restless legs syndrome)    Sleep apnea    CPAP   SOB (shortness of breath)    Urinary incontinence    Urinary tract infection    Wears hearing aid in right ear     Family History: Family History  Problem Relation Age of Onset   Alcohol abuse Mother    Cancer Mother    Stroke Mother    Alcohol abuse Father    Arthritis Sister    Arthritis Daughter    COPD Daughter    Arthritis Paternal Grandmother    Asthma Paternal Grandmother    COPD Paternal Grandmother    Breast cancer Paternal Aunt     Social History   Socioeconomic History   Marital status: Widowed    Spouse name: Not on file   Number of children: 3   Years of education: Not on file   Highest education level: Bachelor's degree (e.g., BA, AB, BS)  Occupational History   Not on file  Tobacco Use   Smoking status: Never   Smokeless tobacco: Never  Vaping Use   Vaping Use: Never used  Substance and Sexual Activity   Alcohol use: Not Currently   Drug use: Never   Sexual activity: Not  Currently  Other Topics Concern   Not on file  Social History Narrative   Moved from [place] 2020, lives near daughter. Retired. Enjoys walking.    Social Determinants of Health   Financial Resource Strain: Low Risk  (08/26/2021)   Overall Financial Resource Strain (CARDIA)    Difficulty of Paying Living Expenses: Not hard at all  Food Insecurity: No Food Insecurity (08/26/2021)   Hunger Vital Sign    Worried About Running Out of Food in the Last Year: Never true    Ran Out of Food in the Last Year: Never true  Transportation Needs: No Transportation Needs (08/26/2021)   PRAPARE - Transportation  Lack of Transportation (Medical): No    Lack of Transportation (Non-Medical): No  Physical Activity: Inactive (08/26/2021)   Exercise Vital Sign    Days of Exercise per Week: 0 days    Minutes of Exercise per Session: 0 min  Stress: No Stress Concern Present (08/26/2021)   [place]    Feeling of Stress : Not at all  Social Connections: Moderately Integrated (08/26/2021)   Social Connection and Isolation Panel [NHANES]    Frequency of Communication with Friends and Family: More than three times a week    Frequency of Social Gatherings with Friends and Family: Three times a week    Attends Religious Services: More than 4 times per year    Active Member of Clubs or Organizations: Yes    Attends Archivist Meetings: More than 4 times per year    Marital Status: Widowed  Intimate Partner Violence: Not At Risk (08/26/2021)   Humiliation, Afraid, Rape, and Kick questionnaire    Fear of Current or Ex-Partner: No    Emotionally Abused: No    Physically Abused: No    Sexually Abused: No      Review of Systems  Constitutional: Negative.   Respiratory: Negative.  Negative for cough, chest tightness, shortness of breath and wheezing.   Cardiovascular: Negative.  Negative for chest pain and palpitations.   Gastrointestinal: Negative.   Musculoskeletal: Negative.   Neurological: Negative.   Psychiatric/Behavioral: Negative.      Vital Signs: BP (!) 113/57   Pulse 73   Temp (!) 97.3 F (36.3 C)   Resp 16   Ht 4' 10"  (1.473 m)   Wt 182 lb 12.8 oz (82.9 kg)   LMP  (LMP Unknown)   SpO2 98%   BMI 38.21 kg/m    Physical Exam Vitals reviewed.  Constitutional:      General: She is not in acute distress.    Appearance: Normal appearance. She is obese. She is not ill-appearing.  HENT:     Head: Normocephalic and atraumatic.  Eyes:     Pupils: Pupils are equal, round, and reactive to light.  Cardiovascular:     Rate and Rhythm: Normal rate and regular rhythm.  Pulmonary:     Effort: Pulmonary effort is normal. No respiratory distress.  Neurological:     Mental Status: She is alert and oriented to person, place, and time.  Psychiatric:        Mood and Affect: Mood normal.        Behavior: Behavior normal.        Assessment/Plan: 1. OSA on CPAP Continue use as instructed, no issues, follow up in 6 months   2. CPAP use counseling Does not need supplies, has no questions or concerns.    General Counseling: alima naser understanding of the findings of todays visit and agrees with plan of treatment. I have discussed any further diagnostic evaluation that may be needed or ordered today. We also reviewed her medications today. she has been encouraged to call the office with any questions or concerns that should arise related to todays visit.    No orders of the defined types were placed in this encounter.   No orders of the defined types were placed in this encounter.   Return in about 6 months (around 06/29/2022) for pulmonary/sleep, F/U, with DSK or Analicia Skibinski.   Total time spent:20 Minutes Time spent includes review of chart, medications, test results, and follow up plan  with the patient.   [place] Controlled Substance Database was reviewed by me.  This patient was  seen by Jonetta Osgood, FNP-C in collaboration with Dr. Clayborn Bigness as a part of collaborative care agreement.   Luann Aspinwall R. Valetta Fuller, MSN, FNP-C Internal medicine

## 2022-01-02 ENCOUNTER — Ambulatory Visit: Payer: Medicare Other | Admitting: Gastroenterology

## 2022-01-02 ENCOUNTER — Ambulatory Visit: Payer: Medicare Other | Admitting: Dermatology

## 2022-01-03 ENCOUNTER — Encounter: Payer: Self-pay | Admitting: Gastroenterology

## 2022-01-03 ENCOUNTER — Ambulatory Visit: Payer: Medicare Other | Admitting: Gastroenterology

## 2022-01-03 VITALS — BP 145/73 | HR 79 | Temp 98.3°F | Ht <= 58 in | Wt 181.4 lb

## 2022-01-03 DIAGNOSIS — D509 Iron deficiency anemia, unspecified: Secondary | ICD-10-CM | POA: Diagnosis not present

## 2022-01-03 DIAGNOSIS — K219 Gastro-esophageal reflux disease without esophagitis: Secondary | ICD-10-CM | POA: Diagnosis not present

## 2022-01-03 MED ORDER — DEXLANSOPRAZOLE 60 MG PO CPDR: 60.0000 mg | DELAYED_RELEASE_CAPSULE | Freq: Every day | ORAL | 0 refills | Status: DC

## 2022-01-03 NOTE — Progress Notes (Signed)
[name], MD [address]  [place], [place] [address]  Main: [phone]  Fax: [phone]    Gastroenterology Consultation  Referring Provider:     Abner Greenspan, MD Primary Care Physician:  Tower, Wynelle Fanny, MD Primary Gastroenterologist:  Dr. [name] Reason for Consultation: Follow-up of iron deficiency anemia, chronic GERD, chronic constipation        HPI:   Erin Petersen is a 81 y.o. female referred by Dr. Glori Bickers, Wynelle Fanny, MD  for follow-up of chronic constipation, recurrence of anemia, chronic GERD.  Patient recently underwent 2 back surgeries in September and October, had tough time recovering.  She is on oxycodone, struggling with constipation.  She is taking MiraLAX daily, sometimes takes up to 3 doses depending on the severity of constipation which can lead to increased bowel movements.  She tries to balance the dose.  Her most recent hemoglobin on 12/15/2021 was 9.4, MCV 76.7.  She does have history of iron deficiency anemia.  She has been taking 60 mg daily for acid reflux which keeps it under control.  Otherwise, she denies any other GI symptoms  She does not smoke cigarettes or drink alcohol regularly  NSAIDs: As needed  Antiplts/Anticoagulants/Anti thrombotics: None  GI Procedures: Colonoscopy in 2018, reportedly normal  Colonoscopy 11/11/2018 - The examined portion of the ileum was normal. Biopsied. - Four 2 to 5 mm polyps in the descending colon and in the ascending colon, removed with a cold snare. Resected and retrieved. - The distal rectum and anal verge are normal on retroflexion view. - Normal mucosa in the entire examined colon.  DIAGNOSIS:  A. TERMINAL ILEUM; COLD BIOPSY:  - ILEAL MUCOSA WITH PROMINENT INTRAMUCOSAL LYMPHOID AGGREGATES.  - NEGATIVE FOR ACTIVE INFLAMMATION, DYSPLASIA, AND MALIGNANCY.   B. COLON POLYP X2, ASCENDING; COLD SNARE:  - TUBULAR ADENOMA, TWO FRAGMENTS.  - NEGATIVE FOR HIGH-GRADE DYSPLASIA AND  MALIGNANCY.   C. COLON POLYP X2, DESCENDING; COLD SNARE:  - TUBULAR ADENOMA, ONE FRAGMENT.  - POLYPOID BENIGN COLONIC MUCOSA, ONE FRAGMENT.  - NEGATIVE FOR HIGH-GRADE DYSPLASIA AND MALIGNANCY.  Upper endoscopy 04/15/2019 - Non-bleeding duodenal ulcer with a clean ulcer base (Forrest Class III). Biopsied. - Normal second portion of the duodenum. - Erythematous mucosa in the gastric body. Biopsied. - Normal incisura and antrum. Biopsied. - Esophagogastric landmarks identified. - Normal gastroesophageal junction and esophagus.  DIAGNOSIS:  A. DUODENAL ULCER; COLD BIOPSY:  - ENTERIC MUCOSA WITH FEATURES OF PEPTIC DUODENITIS.  - NEGATIVE FOR FEATURES OF CELIAC, DYSPLASIA, AND MALIGNANCY.   B. STOMACH, RANDOM; COLD BIOPSY:  - GASTRIC ANTRAL AND OXYNTIC MUCOSA WITH FEATURES OF REACTIVE  GASTROPATHY AND PPI EFFECT.  - NEGATIVE FOR H. PYLORI, DYSPLASIA, AND MALIGNANCY.    Past Medical History:  Diagnosis Date   Arthritis    Asthma    exercise induced asthma   Bacteremia due to Gram-negative bacteria 06/04/2019   Colon polyps    Diabetes mellitus without complication (HCC)    Duodenal ulcer    Dyspnea    GERD (gastroesophageal reflux disease)    Hyperlipidemia    Hypertension    Neuropathy    right leg and foot   Plantar fasciitis    Pre-diabetes    Prediabetes    RLQ abdominal pain    RLS (restless legs syndrome)    Sleep apnea    CPAP   SOB (shortness of breath)    Urinary incontinence    Urinary tract infection  Wears hearing aid in right ear     Past Surgical History:  Procedure Laterality Date   Louisa  2002   lumbar fusion   BILATERAL SALPINGOOPHORECTOMY  01/07/2018   BIOPSY  04/15/2019   Procedure: BIOPSY;  Surgeon: [name], MD;  Location: [facility];  Service: Endoscopy;;   CARDIAC CATHETERIZATION  2019   COLONOSCOPY WITH PROPOFOL N/A 11/11/2018   Procedure: COLONOSCOPY WITH  PROPOFOL;  Surgeon: [name], MD;  Location: [facility];  Service: Gastroenterology;  Laterality: N/A;   CYSTO WITH HYDRODISTENSION N/A 12/30/2018   Procedure: CYSTOSCOPY/HYDRODISTENSION WITH MARCAINE;  Surgeon: Bjorn Loser, MD;  Location: [facility];  Service: Urology;  Laterality: N/A;   ESOPHAGOGASTRODUODENOSCOPY (EGD) WITH PROPOFOL N/A 04/15/2019   Procedure: ESOPHAGOGASTRODUODENOSCOPY (EGD) WITH PROPOFOL;  Surgeon: [name], MD;  Location: [facility];  Service: Endoscopy;  Laterality: N/A;  sleep apnea   ESOPHAGOGASTRODUODENOSCOPY (EGD) WITH PROPOFOL N/A 07/09/2019   Procedure: ESOPHAGOGASTRODUODENOSCOPY (EGD) WITH PROPOFOL;  Surgeon: [name], MD;  Location: [facility];  Service: Gastroenterology;  Laterality: N/A;   EYE SURGERY Bilateral    HERNIA REPAIR  2018   abd   HERNIA REPAIR     umbilical   INGUINAL HERNIA REPAIR Bilateral    inguinal   JOINT REPLACEMENT     LAPAROSCOPIC BILATERAL SALPINGO OOPHERECTOMY  2019   SPINAL FUSION  2002   TONSILLECTOMY AND ADENOIDECTOMY  1961   TOTAL KNEE ARTHROPLASTY Right 01/01/2020   Procedure: Right total knee arthroplasty - [name] to Assist;  Surgeon: Hessie Knows, MD;  Location: [facility];  Service: Orthopedics;  Laterality: Right;   TOTAL KNEE ARTHROPLASTY Left 10/12/2020   Procedure: TOTAL KNEE ARTHROPLASTY;  Surgeon: Hessie Knows, MD;  Location: [facility];  Service: Orthopedics;  Laterality: Left;   TUBAL LIGATION      Current Outpatient Medications:    albuterol (VENTOLIN HFA) 108 (90 Base) MCG/ACT inhaler, Inhale 2 puffs into the lungs every 6 (six) hours as needed for wheezing or shortness of breath., Disp: 2 each, Rfl: 2   Alpha-Lipoic Acid 600 MG CAPS, Take 1 capsule by mouth daily., Disp: , Rfl:    amitriptyline (ELAVIL) 25 MG tablet, TAKE 1 TABLET AT BEDTIME, Disp: 365 tablet, Rfl: 0   amLODipine (NORVASC) 5 MG tablet, Take 1 tablet (5 mg total) by mouth daily., Disp: 1  tablet, Rfl: 0   aspirin 81 MG EC tablet, Take 81 mg by mouth at bedtime. , Disp: , Rfl:    cephALEXin (KEFLEX) 500 MG capsule, Take 500 mg by mouth. PRN dentist appts for 1 year following knee replacement, Disp: , Rfl:    dexlansoprazole (DEXILANT) 60 MG capsule, Take 1 capsule (60 mg total) by mouth daily., Disp: 90 capsule, Rfl: 0   EPINEPHrine 0.3 mg/0.3 mL IJ SOAJ injection, Inject 0.3 mg into the muscle as needed for anaphylaxis., Disp: , Rfl:    hydrochlorothiazide (HYDRODIURIL) 25 MG tablet, TAKE 1 TABLET DAILY, Disp: 90 tablet, Rfl: 3   meloxicam (MOBIC) 15 MG tablet, TAKE 1 TABLET DAILY WITH FOOD AS NEEDED FOR PAIN, Disp: 90 tablet, Rfl: 3   oxyCODONE-acetaminophen (PERCOCET) 10-325 MG tablet, Take 1 tablet by mouth every 4 (four) hours as needed for pain., Disp: , Rfl:    polyethylene glycol (MIRALAX / GLYCOLAX) 17 g packet, Take 1 packet by mouth daily. , Disp: , Rfl:    pregabalin (LYRICA) 200 MG capsule,  Take 1 capsule by mouth in the morning, at noon, and at bedtime., Disp: , Rfl:    rOPINIRole (REQUIP) 1 MG tablet, Take 1 mg by mouth in the morning and at bedtime., Disp: , Rfl:    rOPINIRole (REQUIP) 4 MG tablet, TAKE 1 TABLET AT BEDTIME, Disp: 90 tablet, Rfl: 3   simvastatin (ZOCOR) 20 MG tablet, TAKE 1 TABLET AT BEDTIME, Disp: 90 tablet, Rfl: 3   Cholecalciferol (VITAMIN D3) 50 MCG (2000 UT) TABS, Take 5,000 Units by mouth daily. (Patient not taking: Reported on 01/03/2022), Disp: , Rfl:    Cyanocobalamin (VITAMIN B-12) 1000 MCG SUBL, Take 2,000 mcg by mouth daily. (Patient not taking: Reported on 01/03/2022), Disp: , Rfl:    Family History  Problem Relation Age of Onset   Alcohol abuse Mother    Cancer Mother    Stroke Mother    Alcohol abuse Father    Arthritis Sister    Arthritis Daughter    COPD Daughter    Arthritis Paternal Grandmother    Asthma Paternal Grandmother    COPD Paternal Grandmother    Breast cancer Paternal Aunt      Social History   Tobacco Use    Smoking status: Never   Smokeless tobacco: Never  Vaping Use   Vaping Use: Never used  Substance Use Topics   Alcohol use: Not Currently   Drug use: Never    Allergies as of 01/03/2022 - Review Complete 01/03/2022  Allergen Reaction Noted   Bee venom Anaphylaxis and Hives 12/19/2009   Fosamax [alendronate] Nausea Only 03/13/2020   Ciprofloxacin hcl Rash 12/29/2016   Sulfa antibiotics Rash 06/10/2012    Review of Systems:    All systems reviewed and negative except where noted in HPI.   Physical Exam:  BP (!) 145/73 (BP Location: Left Arm, Patient Position: Sitting, Cuff Size: Normal)   Pulse 79   Temp 98.3 F (36.8 C) (Oral)   Ht 4' 10"  (1.473 m)   Wt 181 lb 6 oz (82.3 kg)   LMP  (LMP Unknown)   BMI 37.91 kg/m  No LMP recorded (lmp unknown). Patient has had a hysterectomy.  General:   Alert,  Well-developed, well-nourished, pleasant and cooperative in NAD Head:  Normocephalic and atraumatic. Eyes:  Sclera clear, no icterus.   Conjunctiva pink. Ears:  Normal auditory acuity. Nose:  No deformity, discharge, or lesions. Mouth:  No deformity or lesions,oropharynx pink & moist. Neck:  Supple; no masses or thyromegaly. Lungs:  Respirations even and unlabored.  Clear throughout to auscultation.   No wheezes, crackles, or rhonchi. No acute distress. Heart:  Regular rate and rhythm; no murmurs, clicks, rubs, or gallops. Abdomen:  Normal bowel sounds. Soft, nontender and moderately distended, tympanic to percussion without masses, hepatosplenomegaly or hernias noted.  No guarding or rebound tenderness.  Wearing brace for back support given recent surgery Rectal: Not performed Msk:  Symmetrical without gross deformities.  Decreased movement in her right lower extremity due to knee osteoarthritis Pulses:  Normal pulses noted. Extremities:  No clubbing or edema.  No cyanosis. Neurologic:  Alert and oriented x3;  grossly normal neurologically. Skin:  Intact without significant  lesions or rashes. No jaundice. Psych:  Alert and cooperative. Normal mood and affect.  Imaging Studies: Reviewed  Assessment and Plan:   Erin Petersen is a 81 y.o. female with history of metabolic syndrome, history of hernia repair, bilateral oophorectomy, interstitial cystitis, chronic GERD, history of duodenal ulcer, chronic constipation, history of iron deficiency  anemia  Chronic GERD Continue Dexilant 60 mg daily, new prescription sent to Reiterated on antireflux lifestyle  History of iron deficiency anemia Low hemoglobin given recent back surgeries Recheck CBC, iron studies today  Chronic constipation:  Tried Linzess in the past, did not work and was expensive Continue current bowel regimen with MiraLAX Discussed about high-fiber  Tubular adenomas of the colon Recommend surveillance colonoscopy in 10/2023  Follow up as needed   [name], MD

## 2022-01-04 ENCOUNTER — Telehealth: Payer: Self-pay

## 2022-01-04 DIAGNOSIS — D509 Iron deficiency anemia, unspecified: Secondary | ICD-10-CM

## 2022-01-04 LAB — CBC
Hematocrit: 28.8 % — ABNORMAL LOW (ref 34.0–46.6)
Hemoglobin: 9 g/dL — ABNORMAL LOW (ref 11.1–15.9)
MCH: 24 pg — ABNORMAL LOW (ref 26.6–33.0)
MCHC: 31.3 g/dL — ABNORMAL LOW (ref 31.5–35.7)
MCV: 77 fL — ABNORMAL LOW (ref 79–97)
Platelets: 458 10*3/uL — ABNORMAL HIGH (ref 150–450)
RBC: 3.75 x10E6/uL — ABNORMAL LOW (ref 3.77–5.28)
RDW: 15.3 % (ref 11.7–15.4)
WBC: 7.1 10*3/uL (ref 3.4–10.8)

## 2022-01-04 LAB — IRON,TIBC AND FERRITIN PANEL
Ferritin: 42 ng/mL (ref 15–150)
Iron Saturation: 6 % — CL (ref 15–55)
Iron: 21 ug/dL — ABNORMAL LOW (ref 27–139)
Total Iron Binding Capacity: 345 ug/dL (ref 250–450)
UIBC: 324 ug/dL (ref 118–369)

## 2022-01-04 LAB — B12 AND FOLATE PANEL
Folate: 7.8 ng/mL (ref >3.0)
Vitamin B-12: 570 pg/mL (ref 232–1245)

## 2022-01-04 LAB — VITAMIN D 25 HYDROXY (VIT D DEFICIENCY, FRACTURES): Vit D, 25-Hydroxy: 37.9 ng/mL (ref 30.0–100.0)

## 2022-01-04 MED ORDER — FUSION PLUS PO CAPS: 1.0000 | ORAL_CAPSULE | Freq: Every day | ORAL | 2 refills | Status: DC

## 2022-01-04 MED ORDER — OMEPRAZOLE 40 MG PO CPDR: 40.0000 mg | DELAYED_RELEASE_CAPSULE | Freq: Every day | ORAL | 3 refills | Status: DC

## 2022-01-04 NOTE — Telephone Encounter (Signed)
Sent medication to the pharmacy  

## 2022-01-04 NOTE — Telephone Encounter (Signed)
Called patient and she verbalized understanding of results. Sent fusion plus to pharmacy [facility]. She states she will come back for labs I 1 month.  She states that the Edgar is not covered by her insurance and they do not cover the medication. She states she would like the omeprazole 57m once a day please advised if this is okay

## 2022-01-04 NOTE — Telephone Encounter (Signed)
-----   Message from [name], MD sent at 01/04/2022 12:25 PM EST ----- Please inform patient that she has severe iron deficiency anemia.  Recommend fusion plus once a day for 3 months, recheck CBC and iron panel in 1 month  RV

## 2022-01-05 ENCOUNTER — Encounter: Payer: Self-pay | Admitting: Family Medicine

## 2022-01-08 DIAGNOSIS — R110 Nausea: Secondary | ICD-10-CM | POA: Diagnosis not present

## 2022-01-08 DIAGNOSIS — J069 Acute upper respiratory infection, unspecified: Secondary | ICD-10-CM | POA: Diagnosis not present

## 2022-01-12 DIAGNOSIS — M4326 Fusion of spine, lumbar region: Secondary | ICD-10-CM | POA: Diagnosis not present

## 2022-01-12 DIAGNOSIS — M48062 Spinal stenosis, lumbar region with neurogenic claudication: Secondary | ICD-10-CM | POA: Diagnosis not present

## 2022-01-12 DIAGNOSIS — M961 Postlaminectomy syndrome, not elsewhere classified: Secondary | ICD-10-CM | POA: Diagnosis not present

## 2022-01-12 DIAGNOSIS — M4306 Spondylolysis, lumbar region: Secondary | ICD-10-CM | POA: Diagnosis not present

## 2022-01-12 DIAGNOSIS — Z981 Arthrodesis status: Secondary | ICD-10-CM | POA: Diagnosis not present

## 2022-01-17 DIAGNOSIS — M1990 Unspecified osteoarthritis, unspecified site: Secondary | ICD-10-CM | POA: Diagnosis not present

## 2022-01-17 DIAGNOSIS — G43109 Migraine with aura, not intractable, without status migrainosus: Secondary | ICD-10-CM | POA: Diagnosis not present

## 2022-01-17 DIAGNOSIS — K5909 Other constipation: Secondary | ICD-10-CM | POA: Diagnosis not present

## 2022-01-17 DIAGNOSIS — M961 Postlaminectomy syndrome, not elsewhere classified: Secondary | ICD-10-CM | POA: Diagnosis not present

## 2022-01-17 DIAGNOSIS — K429 Umbilical hernia without obstruction or gangrene: Secondary | ICD-10-CM | POA: Diagnosis not present

## 2022-01-17 DIAGNOSIS — M5431 Sciatica, right side: Secondary | ICD-10-CM | POA: Diagnosis not present

## 2022-01-17 DIAGNOSIS — K219 Gastro-esophageal reflux disease without esophagitis: Secondary | ICD-10-CM | POA: Diagnosis not present

## 2022-01-17 DIAGNOSIS — E785 Hyperlipidemia, unspecified: Secondary | ICD-10-CM | POA: Diagnosis not present

## 2022-01-17 DIAGNOSIS — I272 Pulmonary hypertension, unspecified: Secondary | ICD-10-CM | POA: Diagnosis not present

## 2022-01-17 DIAGNOSIS — G629 Polyneuropathy, unspecified: Secondary | ICD-10-CM | POA: Diagnosis not present

## 2022-01-17 DIAGNOSIS — G2581 Restless legs syndrome: Secondary | ICD-10-CM | POA: Diagnosis not present

## 2022-01-17 DIAGNOSIS — M4010 Other secondary kyphosis, site unspecified: Secondary | ICD-10-CM | POA: Diagnosis not present

## 2022-01-17 DIAGNOSIS — I2510 Atherosclerotic heart disease of native coronary artery without angina pectoris: Secondary | ICD-10-CM | POA: Diagnosis not present

## 2022-01-17 DIAGNOSIS — M5432 Sciatica, left side: Secondary | ICD-10-CM | POA: Diagnosis not present

## 2022-01-17 DIAGNOSIS — J309 Allergic rhinitis, unspecified: Secondary | ICD-10-CM | POA: Diagnosis not present

## 2022-01-17 DIAGNOSIS — M8008XD Age-related osteoporosis with current pathological fracture, vertebra(e), subsequent encounter for fracture with routine healing: Secondary | ICD-10-CM | POA: Diagnosis not present

## 2022-01-17 DIAGNOSIS — I10 Essential (primary) hypertension: Secondary | ICD-10-CM | POA: Diagnosis not present

## 2022-01-17 DIAGNOSIS — M4156 Other secondary scoliosis, lumbar region: Secondary | ICD-10-CM | POA: Diagnosis not present

## 2022-01-17 DIAGNOSIS — K4090 Unilateral inguinal hernia, without obstruction or gangrene, not specified as recurrent: Secondary | ICD-10-CM | POA: Diagnosis not present

## 2022-01-17 DIAGNOSIS — J449 Chronic obstructive pulmonary disease, unspecified: Secondary | ICD-10-CM | POA: Diagnosis not present

## 2022-01-17 DIAGNOSIS — M48061 Spinal stenosis, lumbar region without neurogenic claudication: Secondary | ICD-10-CM | POA: Diagnosis not present

## 2022-01-17 DIAGNOSIS — Z4789 Encounter for other orthopedic aftercare: Secondary | ICD-10-CM | POA: Diagnosis not present

## 2022-01-17 DIAGNOSIS — M4726 Other spondylosis with radiculopathy, lumbar region: Secondary | ICD-10-CM | POA: Diagnosis not present

## 2022-01-17 DIAGNOSIS — E559 Vitamin D deficiency, unspecified: Secondary | ICD-10-CM | POA: Diagnosis not present

## 2022-01-17 DIAGNOSIS — K589 Irritable bowel syndrome without diarrhea: Secondary | ICD-10-CM | POA: Diagnosis not present

## 2022-01-27 ENCOUNTER — Encounter: Payer: Self-pay | Admitting: Family Medicine

## 2022-01-31 ENCOUNTER — Ambulatory Visit (INDEPENDENT_AMBULATORY_CARE_PROVIDER_SITE_OTHER): Payer: Medicare Other | Admitting: Family Medicine

## 2022-01-31 ENCOUNTER — Encounter: Payer: Self-pay | Admitting: Family Medicine

## 2022-01-31 VITALS — BP 160/74 | HR 90 | Temp 98.4°F | Ht <= 58 in

## 2022-01-31 DIAGNOSIS — D500 Iron deficiency anemia secondary to blood loss (chronic): Secondary | ICD-10-CM | POA: Diagnosis not present

## 2022-01-31 DIAGNOSIS — R7303 Prediabetes: Secondary | ICD-10-CM

## 2022-01-31 DIAGNOSIS — R1900 Intra-abdominal and pelvic swelling, mass and lump, unspecified site: Secondary | ICD-10-CM | POA: Diagnosis not present

## 2022-01-31 DIAGNOSIS — D509 Iron deficiency anemia, unspecified: Secondary | ICD-10-CM | POA: Insufficient documentation

## 2022-01-31 DIAGNOSIS — I10 Essential (primary) hypertension: Secondary | ICD-10-CM | POA: Diagnosis not present

## 2022-01-31 DIAGNOSIS — K589 Irritable bowel syndrome without diarrhea: Secondary | ICD-10-CM

## 2022-01-31 LAB — BASIC METABOLIC PANEL
BUN: 11 mg/dL (ref 6–23)
CO2: 32 mEq/L (ref 19–32)
Calcium: 9.9 mg/dL (ref 8.4–10.5)
Chloride: 97 mEq/L (ref 96–112)
Creatinine, Ser: 0.71 mg/dL (ref 0.40–1.20)
GFR: 80.34 mL/min (ref >60.00)
Glucose, Bld: 114 mg/dL — ABNORMAL HIGH (ref 70–99)
Potassium: 3.8 mEq/L (ref 3.5–5.1)
Sodium: 138 mEq/L (ref 135–145)

## 2022-01-31 LAB — CBC WITH DIFFERENTIAL/PLATELET
Basophils Absolute: 0.1 10*3/uL (ref 0.0–0.1)
Basophils Relative: 1 % (ref 0.0–3.0)
Eosinophils Absolute: 0.3 10*3/uL (ref 0.0–0.7)
Eosinophils Relative: 5 % (ref 0.0–5.0)
HCT: 34.9 % — ABNORMAL LOW (ref 36.0–46.0)
Hemoglobin: 11.3 g/dL — ABNORMAL LOW (ref 12.0–15.0)
Lymphocytes Relative: 21.8 % (ref 12.0–46.0)
Lymphs Abs: 1.5 10*3/uL (ref 0.7–4.0)
MCHC: 32.4 g/dL (ref 30.0–36.0)
MCV: 73.5 fl — ABNORMAL LOW (ref 78.0–100.0)
Monocytes Absolute: 0.6 10*3/uL (ref 0.1–1.0)
Monocytes Relative: 8.9 % (ref 3.0–12.0)
Neutro Abs: 4.3 10*3/uL (ref 1.4–7.7)
Neutrophils Relative %: 63.3 % (ref 43.0–77.0)
Platelets: 350 10*3/uL (ref 150.0–400.0)
RBC: 4.75 Mil/uL (ref 3.87–5.11)
RDW: 18.1 % — ABNORMAL HIGH (ref 11.5–15.5)
WBC: 6.7 10*3/uL (ref 4.0–10.5)

## 2022-01-31 LAB — HEMOGLOBIN A1C: Hgb A1c MFr Bld: 6.3 % (ref 4.6–6.5)

## 2022-01-31 LAB — IRON: Iron: 33 ug/dL — ABNORMAL LOW (ref 42–145)

## 2022-01-31 NOTE — Patient Instructions (Addendum)
You may have a hernia   Labs today  Then I will plan a CT scan in [place]  Then if result does confirm hernia I will set you up with a surgeon in [place]  Try not to stain   If you develop severe pain at any point- go to the ER Wear your abdomina/ back brace also

## 2022-01-31 NOTE — Assessment & Plan Note (Signed)
From blood loss/back surgeries Does not tolerate iron well   Cbc and iron studies today ? Consider iron inf if needed

## 2022-01-31 NOTE — Assessment & Plan Note (Signed)
A1c ordered disc imp of low glycemic diet and wt loss to prevent DM2

## 2022-01-31 NOTE — Assessment & Plan Note (Signed)
New  R sided  Exam consistent with poss hernia Some tenderness/discomfort  Disc ER precautions in detail  Will continue to wear abd /back brace for support  Avoid straining  Bmet today  Then CT abd/pel with contrast will be ordered  If hernia-will ref to gen surg

## 2022-01-31 NOTE — Progress Notes (Signed)
Subjective:    Patient ID: Erin Petersen, female    DOB: [birth year], 81 y.o.   MRN: [id number]  HPI Pt presents for f/u of R sided abd bulge and cramping  Wt Readings from Last 3 Encounters:  01/03/22 181 lb 6 oz (82.3 kg)  12/29/21 182 lb 12.8 oz (82.9 kg)  12/28/21 183 lb 6 oz (83.2 kg)   37.91 kg/m  Has lost 10 lb since last visit  She can move around now- happy about that  Getting closer to normal   Bulge in R abd started about 3 weeks ago  Noted it looked different in the mirror  Is tender /uncomfortable   Friday she had hot chocolate  Also pizza and salad  Later that day cramping and 2 bm  The next day more frequent bms -sat and sunday Unsure if related to this   No bm Monday  None yet today     Had uri in mid nov with coughing  Lumbar surgery in sept/oct  Became anemic Struggled with constipation due to narcotic pain med and saw GI last month  Recommended miralax (she failed linzess)  Put her on fusion plus for anemia -hard to take due to nausea  She has chronic nausea anyway (takes zofran for this)   Dx IBS with constipation  . Lab Results  Component Value Date   WBC 7.1 01/03/2022   HGB 9.0 (L) 01/03/2022   HCT 28.8 (L) 01/03/2022   MCV 77 (L) 01/03/2022   PLT 458 (H) 01/03/2022     Doing PT and OT twice weekly   3 hernia surgeries  One in groin  2 in abdomen (one umbilical)  Chart notes inguinal hernia on R in the past with surgery  Hysterectomy in past    Last GFR was over 90 in oct   Prefers surgeon on [place]  Uses squat potty-helpful  Lab Results  Component Value Date   HGBA1C 6.3 (A) 08/04/2021   Patient Active Problem List   Diagnosis Date Noted   Abdominal wall bulge 01/31/2022   Iron deficiency anemia 01/31/2022   Constipation due to pain medication 12/28/2021   Post-operative pain 12/28/2021   Back pain at L4-L5 level 12/01/2021   Hypertension 11/15/2021   Inguinal hernia of right side without obstruction or  gangrene 20/94/7096   Umbilical hernia without obstruction or gangrene 11/15/2021   Coronary artery disease involving native coronary artery of native heart 10/07/2021   Other secondary scoliosis, lumbar region 10/04/2021   Postlaminectomy syndrome of lumbar region 10/04/2021   Pedal edema 07/26/2021   Mild pulmonary hypertension ([place]) 07/26/2021   Hyperlipidemia 11/17/2020   S/P TKR (total knee replacement) using cement, left 10/12/2020   Somnolence, daytime 09/09/2020   Rash 09/09/2020   Medicare annual wellness visit, subsequent 08/25/2020   Encounter for screening mammogram for breast cancer 08/25/2020   Encounter for general adult medical examination with abnormal findings 08/25/2020   GERD (gastroesophageal reflux disease) 08/25/2020   Dizzy spells 08/25/2020   Migraine 03/12/2020   IBS (irritable bowel syndrome) 03/12/2020   S/P TKR (total knee replacement) using cement, right 01/01/2020   Colonic polyp 10/22/2019   History of vaginal hysterectomy 06/11/2019   Estrogen deficiency 04/22/2019   History of skin cancer 04/22/2019   High-tone pelvic floor dysfunction 04/03/2019   IC (interstitial cystitis) 04/03/2019   Neuralgia of both pudendal nerves 04/03/2019   Pelvic pain in female 04/03/2019   Osteoporosis 02/12/2019   Asthma 01/31/2019   Adverse  reaction to influenza vaccine, initial encounter 12/03/2018   Vitamin D deficiency 12/03/2018   Restless legs syndrome 11/28/2018   Small fiber neuropathy 11/28/2018   Allergic rhinitis 10/08/2018   Benign essential hypertension 10/08/2018   Chronic bladder pain 10/08/2018   Neuropathy 10/08/2018   Osteoarthritis 10/08/2018   S/P BSO (bilateral salpingo-oophorectomy) 01/07/2018   OSA on CPAP 12/29/2016   Chronic obstructive pulmonary disease ([place]) 12/29/2016   Chronic dyspnea 06/24/2012   Other and unspecified disc disorder of lumbar region 10/25/2000   Diverticulosis of colon 10/25/2000   Prediabetes 09/24/1998    Tinnitus of both ears 09/24/1983   Past Medical History:  Diagnosis Date   Arthritis    Asthma    exercise induced asthma   Bacteremia due to Gram-negative bacteria 06/04/2019   Colon polyps    Diabetes mellitus without complication (HCC)    Duodenal ulcer    Dyspnea    GERD (gastroesophageal reflux disease)    Hyperlipidemia    Hypertension    Neuropathy    right leg and foot   Plantar fasciitis    Pre-diabetes    Prediabetes    RLQ abdominal pain    RLS (restless legs syndrome)    Sleep apnea    CPAP   SOB (shortness of breath)    Urinary incontinence    Urinary tract infection    Wears hearing aid in right ear    Past Surgical History:  Procedure Laterality Date   [place]  2002   lumbar fusion   BILATERAL SALPINGOOPHORECTOMY  01/07/2018   BIOPSY  04/15/2019   Procedure: BIOPSY;  Surgeon: [name], MD;  Location: [facility];  Service: Endoscopy;;   CARDIAC CATHETERIZATION  2019   COLONOSCOPY WITH PROPOFOL N/A 11/11/2018   Procedure: COLONOSCOPY WITH PROPOFOL;  Surgeon: [name], MD;  Location: [facility];  Service: Gastroenterology;  Laterality: N/A;   CYSTO WITH HYDRODISTENSION N/A 12/30/2018   Procedure: CYSTOSCOPY/HYDRODISTENSION WITH MARCAINE;  Surgeon: Bjorn Loser, MD;  Location: [facility];  Service: Urology;  Laterality: N/A;   ESOPHAGOGASTRODUODENOSCOPY (EGD) WITH PROPOFOL N/A 04/15/2019   Procedure: ESOPHAGOGASTRODUODENOSCOPY (EGD) WITH PROPOFOL;  Surgeon: [name], MD;  Location: [place];  Service: Endoscopy;  Laterality: N/A;  sleep apnea   ESOPHAGOGASTRODUODENOSCOPY (EGD) WITH PROPOFOL N/A 07/09/2019   Procedure: ESOPHAGOGASTRODUODENOSCOPY (EGD) WITH PROPOFOL;  Surgeon: [name], MD;  Location: [place];  Service: Gastroenterology;  Laterality: N/A;   EYE SURGERY Bilateral    HERNIA REPAIR  2018   abd   HERNIA REPAIR      umbilical   INGUINAL HERNIA REPAIR Bilateral    inguinal   JOINT REPLACEMENT     LAPAROSCOPIC BILATERAL SALPINGO OOPHERECTOMY  2019   SPINAL FUSION  2002   TONSILLECTOMY AND ADENOIDECTOMY  1961   TOTAL KNEE ARTHROPLASTY Right 01/01/2020   Procedure: Right total knee arthroplasty - [name] to Assist;  Surgeon: Hessie Knows, MD;  Location: [facility];  Service: Orthopedics;  Laterality: Right;   TOTAL KNEE ARTHROPLASTY Left 10/12/2020   Procedure: TOTAL KNEE ARTHROPLASTY;  Surgeon: Hessie Knows, MD;  Location: [facility];  Service: Orthopedics;  Laterality: Left;   TUBAL LIGATION     Social History   Tobacco Use   Smoking status: Never   Smokeless tobacco: Never  Vaping Use   Vaping Use: Never used  Substance Use Topics   Alcohol use: Not Currently   Drug  use: Never   Family History  Problem Relation Age of Onset   Alcohol abuse Mother    Cancer Mother    Stroke Mother    Alcohol abuse Father    Arthritis Sister    Arthritis Daughter    COPD Daughter    Arthritis Paternal Grandmother    Asthma Paternal Grandmother    COPD Paternal Grandmother    Breast cancer Paternal Aunt    Allergies  Allergen Reactions   Bee Venom Anaphylaxis and Hives   Fosamax [Alendronate] Nausea Only   Ciprofloxacin Hcl Rash    Mild rash after long term use   Sulfa Antibiotics Rash    Bactrim   Current Outpatient Medications on File Prior to Visit  Medication Sig Dispense Refill   albuterol (VENTOLIN HFA) 108 (90 Base) MCG/ACT inhaler Inhale 2 puffs into the lungs every 6 (six) hours as needed for wheezing or shortness of breath. 2 each 2   amitriptyline (ELAVIL) 25 MG tablet TAKE 1 TABLET AT BEDTIME 365 tablet 0   amLODipine (NORVASC) 5 MG tablet Take 1 tablet (5 mg total) by mouth daily. 1 tablet 0   aspirin 81 MG EC tablet Take 81 mg by mouth at bedtime.      calcitonin, salmon, (MIACALCIN/FORTICAL) 200 UNIT/ACT nasal spray SMARTSIG:Both Nares     cephALEXin (KEFLEX) 500 MG capsule  Take 500 mg by mouth. PRN dentist appts for 1 year following knee replacement     Cholecalciferol (VITAMIN D3) 50 MCG (2000 UT) TABS Take 5,000 Units by mouth daily.     Cyanocobalamin (VITAMIN B-12) 1000 MCG SUBL Take 2,000 mcg by mouth daily.     EPINEPHrine 0.3 mg/0.3 mL IJ SOAJ injection Inject 0.3 mg into the muscle as needed for anaphylaxis.     hydrochlorothiazide (HYDRODIURIL) 25 MG tablet TAKE 1 TABLET DAILY 90 tablet 3   Iron-FA-B Cmp-C-Biot-Probiotic (FUSION PLUS) CAPS Take 1 capsule by mouth daily. 30 capsule 2   meloxicam (MOBIC) 15 MG tablet TAKE 1 TABLET DAILY WITH FOOD AS NEEDED FOR PAIN 90 tablet 3   methocarbamol (ROBAXIN) 500 MG tablet Take 500 mg by mouth 3 (three) times daily.     omeprazole (PRILOSEC) 40 MG capsule Take 1 capsule (40 mg total) by mouth daily before breakfast. 90 capsule 3   ondansetron (ZOFRAN-ODT) 4 MG disintegrating tablet Take 4 mg by mouth every 8 (eight) hours as needed.     oxyCODONE-acetaminophen (PERCOCET) 10-325 MG tablet Take 1 tablet by mouth every 4 (four) hours as needed for pain.     polyethylene glycol (MIRALAX / GLYCOLAX) 17 g packet Take 1 packet by mouth daily.      pregabalin (LYRICA) 150 MG capsule Take 150 mg by mouth in the morning and 300 mg by mouth in the evening     rOPINIRole (REQUIP) 1 MG tablet Take 1 mg by mouth in the morning and at bedtime.     rOPINIRole (REQUIP) 4 MG tablet TAKE 1 TABLET AT BEDTIME 90 tablet 3   simvastatin (ZOCOR) 20 MG tablet TAKE 1 TABLET AT BEDTIME 90 tablet 3   No current facility-administered medications on file prior to visit.    Review of Systems  Constitutional:  Negative for activity change, appetite change, fatigue, fever and unexpected weight change.  HENT:  Negative for congestion, ear pain, rhinorrhea, sinus pressure and sore throat.   Eyes:  Negative for pain, redness and visual disturbance.  Respiratory:  Negative for cough, shortness of breath and wheezing.  Cardiovascular:  Negative  for chest pain and palpitations.  Gastrointestinal:  Positive for abdominal distention, abdominal pain and nausea. Negative for anal bleeding, blood in stool, constipation, diarrhea, rectal pain and vomiting.  Endocrine: Negative for polydipsia and polyuria.  Genitourinary:  Negative for dysuria, frequency and urgency.  Musculoskeletal:  Negative for arthralgias, back pain and myalgias.  Skin:  Negative for pallor and rash.  Allergic/Immunologic: Negative for environmental allergies.  Neurological:  Negative for dizziness, syncope and headaches.  Hematological:  Negative for adenopathy. Does not bruise/bleed easily.  Psychiatric/Behavioral:  Negative for decreased concentration and dysphoric mood. The patient is not nervous/anxious.        Objective:   Physical Exam Constitutional:      General: She is not in acute distress.    Appearance: Normal appearance. She is well-developed. She is obese. She is not ill-appearing or diaphoretic.  HENT:     Head: Normocephalic and atraumatic.  Eyes:     General: No scleral icterus.    Conjunctiva/sclera: Conjunctivae normal.     Pupils: Pupils are equal, round, and reactive to light.  Cardiovascular:     Rate and Rhythm: Normal rate and regular rhythm.     Heart sounds: Normal heart sounds.  Pulmonary:     Effort: Pulmonary effort is normal. No respiratory distress.     Breath sounds: Normal breath sounds. No stridor. No wheezing, rhonchi or rales.  Abdominal:     General: Abdomen is protuberant. Bowel sounds are normal. There is no distension.     Palpations: Abdomen is soft. There is no hepatomegaly, splenomegaly, mass or pulsatile mass.     Tenderness: There is abdominal tenderness. There is no right CVA tenderness, left CVA tenderness, guarding or rebound. Negative signs include Murphy's sign.     Comments: R side of abdomen is protuberant / bulging  Tympanic/ mildly tender (only on the R side)  No fluid wave  No rebound or guarding  No  HSM noted   Scars noted at umb and L flank and suprapubic from prior surgeries   Musculoskeletal:     Cervical back: Normal range of motion and neck supple.  Lymphadenopathy:     Cervical: No cervical adenopathy.  Skin:    General: Skin is warm and dry.     Coloration: Skin is not pale.     Findings: No erythema.  Neurological:     Mental Status: She is alert.  Psychiatric:        Mood and Affect: Mood normal.           Assessment & Plan:   Problem List Items Addressed This Visit       Cardiovascular and Mediastinum   Benign essential hypertension    Bmet today       Relevant Orders   Basic metabolic panel     Digestive   IBS (irritable bowel syndrome)    Constipation predominant  Sees GI On miralax  Linzess did not work for her       Relevant Medications   ondansetron (ZOFRAN-ODT) 4 MG disintegrating tablet     Other   Abdominal wall bulge - Primary    New  R sided  Exam consistent with poss hernia Some tenderness/discomfort  Disc ER precautions in detail  Will continue to wear abd /back brace for support  Avoid straining  Bmet today  Then CT abd/pel with contrast will be ordered  If hernia-will ref to gen surg      Relevant Orders  Basic metabolic panel   Iron deficiency anemia    From blood loss/back surgeries Does not tolerate iron well   Cbc and iron studies today ? Consider iron inf if needed      Relevant Orders   CBC with Differential/Platelet   Iron   Prediabetes    A1c ordered disc imp of low glycemic diet and wt loss to prevent DM2       Relevant Orders   Hemoglobin A1c

## 2022-01-31 NOTE — Assessment & Plan Note (Addendum)
Constipation predominant  Sees GI On miralax  Linzess did not work for her

## 2022-01-31 NOTE — Assessment & Plan Note (Addendum)
Bmet today

## 2022-02-01 NOTE — Addendum Note (Signed)
Addended by: Loura Pardon A on: 02/01/2022 08:52 PM   Modules accepted: Orders

## 2022-02-01 NOTE — Addendum Note (Signed)
Addended by: Loura Pardon A on: 02/01/2022 08:53 PM   Modules accepted: Orders

## 2022-02-02 ENCOUNTER — Ambulatory Visit
Admission: RE | Admit: 2022-02-02 | Discharge: 2022-02-02 | Disposition: A | Discharge status: Home / Self Care | Payer: Medicare Other | Source: Ambulatory Visit | Attending: Family Medicine | Admitting: Family Medicine

## 2022-02-02 ENCOUNTER — Encounter: Payer: Self-pay | Admitting: Family Medicine

## 2022-02-02 DIAGNOSIS — K5730 Diverticulosis of large intestine without perforation or abscess without bleeding: Secondary | ICD-10-CM | POA: Diagnosis not present

## 2022-02-02 DIAGNOSIS — R188 Other ascites: Secondary | ICD-10-CM | POA: Diagnosis not present

## 2022-02-02 DIAGNOSIS — R1900 Intra-abdominal and pelvic swelling, mass and lump, unspecified site: Secondary | ICD-10-CM

## 2022-02-02 DIAGNOSIS — K439 Ventral hernia without obstruction or gangrene: Secondary | ICD-10-CM | POA: Diagnosis not present

## 2022-02-02 DIAGNOSIS — K429 Umbilical hernia without obstruction or gangrene: Secondary | ICD-10-CM | POA: Diagnosis not present

## 2022-02-02 MED ORDER — IOPAMIDOL (ISOVUE-300) INJECTION 61%
80.0000 mL | Freq: Once | INTRAVENOUS | Status: AC | PRN
  Administered 2022-02-02: 80 mL via INTRAVENOUS

## 2022-02-06 ENCOUNTER — Ambulatory Visit: Payer: Medicare Other | Admitting: Dermatology

## 2022-02-07 DIAGNOSIS — M5432 Sciatica, left side: Secondary | ICD-10-CM | POA: Diagnosis not present

## 2022-02-07 DIAGNOSIS — Z4789 Encounter for other orthopedic aftercare: Secondary | ICD-10-CM | POA: Diagnosis not present

## 2022-02-07 DIAGNOSIS — M48061 Spinal stenosis, lumbar region without neurogenic claudication: Secondary | ICD-10-CM | POA: Diagnosis not present

## 2022-02-09 DIAGNOSIS — M8008XA Age-related osteoporosis with current pathological fracture, vertebra(e), initial encounter for fracture: Secondary | ICD-10-CM | POA: Diagnosis not present

## 2022-02-16 ENCOUNTER — Encounter: Payer: Self-pay | Admitting: Gastroenterology

## 2022-02-16 ENCOUNTER — Ambulatory Visit
Admission: RE | Admit: 2022-02-16 | Discharge: 2022-02-16 | Disposition: A | Discharge status: Home / Self Care | Payer: Medicare Other | Attending: Gastroenterology | Admitting: Gastroenterology

## 2022-02-16 ENCOUNTER — Ambulatory Visit: Payer: Medicare Other | Admitting: Gastroenterology

## 2022-02-16 ENCOUNTER — Ambulatory Visit
Admission: RE | Admit: 2022-02-16 | Discharge: 2022-02-16 | Disposition: A | Discharge status: Home / Self Care | Payer: Medicare Other | Source: Ambulatory Visit | Attending: Gastroenterology | Admitting: Gastroenterology

## 2022-02-16 ENCOUNTER — Other Ambulatory Visit: Payer: Self-pay

## 2022-02-16 VITALS — BP 122/73 | HR 87 | Temp 97.8°F | Ht <= 58 in | Wt 176.1 lb

## 2022-02-16 DIAGNOSIS — K5909 Other constipation: Secondary | ICD-10-CM

## 2022-02-16 DIAGNOSIS — R140 Abdominal distension (gaseous): Secondary | ICD-10-CM | POA: Insufficient documentation

## 2022-02-16 DIAGNOSIS — K5900 Constipation, unspecified: Secondary | ICD-10-CM | POA: Diagnosis not present

## 2022-02-16 MED ORDER — GOLYTELY 236 G PO SOLR: 4000.0000 mL | Freq: Once | ORAL | 0 refills | Status: AC

## 2022-02-16 NOTE — Progress Notes (Signed)
Erin Darby, MD [address]  [place], [place] [address]  Main: [phone]  Fax: [phone]    Gastroenterology Consultation  Referring Provider:     Abner Greenspan, MD Primary Care Physician:  Tower, Wynelle Fanny, MD Primary Gastroenterologist:  Dr. Cephas Petersen Reason for Consultation: chronic constipation        HPI:   Erin Petersen is a 81 y.o. female referred by Dr. Glori Bickers, Wynelle Fanny, MD  for follow-up of chronic constipation, recurrence of anemia, chronic GERD.  Patient recently underwent 2 back surgeries in September and October, had tough time recovering.  She is on oxycodone, struggling with constipation.  She is taking MiraLAX daily, sometimes takes up to 3 doses depending on the severity of constipation which can lead to increased bowel movements.  She tries to balance the dose.  Her most recent hemoglobin on 12/15/2021 was 9.4, MCV 76.7.  She does have history of iron deficiency anemia.  She has been taking 60 mg daily for acid reflux which keeps it under control.  Otherwise, she denies any other GI symptoms  She does not smoke cigarettes or drink alcohol regularly  Follow-up visit 02/16/2022 Erin Petersen is here to discuss about recent CT scan findings as well as right flank fullness, worsening of chronic constipation.  Patient had a CT abdomen pelvis with contrast on 02/02/2022 secondary to fullness in the right flank that she noticed, the CT abdomen and pelvis came back unremarkable.  There was no evidence of any ventral hernia or dilated bowel loops based on the CT scan.  Patient tried high-dose Linzess in the past which did not work.  She said within the last 10 days that she hardly had any bowel movement.  She tried magnesium citrate and he continues to take MiraLAX with very small amounts of bowel at the time.  Last 1 was about a week ago.  She reports feeling distended.  She denies any rectal bleeding.  She is also dealing with spine issues and  wearing brace.  She has close follow-up with orthopedic surgeon.  NSAIDs: As needed  Antiplts/Anticoagulants/Anti thrombotics: None  GI Procedures: Colonoscopy in 2018, reportedly normal  Colonoscopy 11/11/2018 - The examined portion of the ileum was normal. Biopsied. - Four 2 to 5 mm polyps in the descending colon and in the ascending colon, removed with a cold snare. Resected and retrieved. - The distal rectum and anal verge are normal on retroflexion view. - Normal mucosa in the entire examined colon.  DIAGNOSIS:  A. TERMINAL ILEUM; COLD BIOPSY:  - ILEAL MUCOSA WITH PROMINENT INTRAMUCOSAL LYMPHOID AGGREGATES.  - NEGATIVE FOR ACTIVE INFLAMMATION, DYSPLASIA, AND MALIGNANCY.   B. COLON POLYP X2, ASCENDING; COLD SNARE:  - TUBULAR ADENOMA, TWO FRAGMENTS.  - NEGATIVE FOR HIGH-GRADE DYSPLASIA AND MALIGNANCY.   C. COLON POLYP X2, DESCENDING; COLD SNARE:  - TUBULAR ADENOMA, ONE FRAGMENT.  - POLYPOID BENIGN COLONIC MUCOSA, ONE FRAGMENT.  - NEGATIVE FOR HIGH-GRADE DYSPLASIA AND MALIGNANCY.  Upper endoscopy 04/15/2019 - Non-bleeding duodenal ulcer with a clean ulcer base (Forrest Class III). Biopsied. - Normal second portion of the duodenum. - Erythematous mucosa in the gastric body. Biopsied. - Normal incisura and antrum. Biopsied. - Esophagogastric landmarks identified. - Normal gastroesophageal junction and esophagus.  DIAGNOSIS:  A. DUODENAL ULCER; COLD BIOPSY:  - ENTERIC MUCOSA WITH FEATURES OF PEPTIC DUODENITIS.  - NEGATIVE FOR FEATURES OF CELIAC, DYSPLASIA, AND MALIGNANCY.   B. STOMACH, RANDOM; COLD BIOPSY:  -  GASTRIC ANTRAL AND OXYNTIC MUCOSA WITH FEATURES OF REACTIVE  GASTROPATHY AND PPI EFFECT.  - NEGATIVE FOR H. PYLORI, DYSPLASIA, AND MALIGNANCY.    Past Medical History:  Diagnosis Date   Arthritis    Asthma    exercise induced asthma   Bacteremia due to Gram-negative bacteria 06/04/2019   Colon polyps    Diabetes mellitus without complication (HCC)    Duodenal  ulcer    Dyspnea    GERD (gastroesophageal reflux disease)    Hyperlipidemia    Hypertension    Neuropathy    right leg and foot   Plantar fasciitis    Pre-diabetes    Prediabetes    RLQ abdominal pain    RLS (restless legs syndrome)    Sleep apnea    CPAP   SOB (shortness of breath)    Urinary incontinence    Urinary tract infection    Wears hearing aid in right ear     Past Surgical History:  Procedure Laterality Date   [place]  2002   lumbar fusion   BILATERAL SALPINGOOPHORECTOMY  01/07/2018   BIOPSY  04/15/2019   Procedure: BIOPSY;  Surgeon: [name], MD;  Location: [facility];  Service: Endoscopy;;   CARDIAC CATHETERIZATION  2019   COLONOSCOPY WITH PROPOFOL N/A 11/11/2018   Procedure: COLONOSCOPY WITH PROPOFOL;  Surgeon: [name], MD;  Location: [facility];  Service: Gastroenterology;  Laterality: N/A;   CYSTO WITH HYDRODISTENSION N/A 12/30/2018   Procedure: CYSTOSCOPY/HYDRODISTENSION WITH MARCAINE;  Surgeon: Bjorn Loser, MD;  Location: [facility];  Service: Urology;  Laterality: N/A;   ESOPHAGOGASTRODUODENOSCOPY (EGD) WITH PROPOFOL N/A 04/15/2019   Procedure: ESOPHAGOGASTRODUODENOSCOPY (EGD) WITH PROPOFOL;  Surgeon: [name], MD;  Location: [facility];  Service: Endoscopy;  Laterality: N/A;  sleep apnea   ESOPHAGOGASTRODUODENOSCOPY (EGD) WITH PROPOFOL N/A 07/09/2019   Procedure: ESOPHAGOGASTRODUODENOSCOPY (EGD) WITH PROPOFOL;  Surgeon: [name], MD;  Location: [facility];  Service: Gastroenterology;  Laterality: N/A;   EYE SURGERY Bilateral    HERNIA REPAIR  2018   abd   HERNIA REPAIR     umbilical   INGUINAL HERNIA REPAIR Bilateral    inguinal   JOINT REPLACEMENT     LAPAROSCOPIC BILATERAL SALPINGO OOPHERECTOMY  2019   SPINAL FUSION  2002   TONSILLECTOMY AND ADENOIDECTOMY  1961   TOTAL KNEE ARTHROPLASTY Right 01/01/2020    Procedure: Right total knee arthroplasty - [name] to Assist;  Surgeon: Hessie Knows, MD;  Location: [facility];  Service: Orthopedics;  Laterality: Right;   TOTAL KNEE ARTHROPLASTY Left 10/12/2020   Procedure: TOTAL KNEE ARTHROPLASTY;  Surgeon: Hessie Knows, MD;  Location: [facility];  Service: Orthopedics;  Laterality: Left;   TUBAL LIGATION      Current Outpatient Medications:    albuterol (VENTOLIN HFA) 108 (90 Base) MCG/ACT inhaler, Inhale 2 puffs into the lungs every 6 (six) hours as needed for wheezing or shortness of breath., Disp: 2 each, Rfl: 2   amitriptyline (ELAVIL) 25 MG tablet, TAKE 1 TABLET AT BEDTIME, Disp: 365 tablet, Rfl: 0   amLODipine (NORVASC) 5 MG tablet, Take 1 tablet (5 mg total) by mouth daily., Disp: 1 tablet, Rfl: 0   aspirin 81 MG EC tablet, Take 81 mg by mouth at bedtime. , Disp: , Rfl:    calcitonin, salmon, (MIACALCIN/FORTICAL) 200 UNIT/ACT nasal spray, SMARTSIG:Both Nares, Disp: , Rfl:    cephALEXin (KEFLEX) 500 MG capsule,  Take 500 mg by mouth. PRN dentist appts for 1 year following knee replacement, Disp: , Rfl:    Cholecalciferol (VITAMIN D3) 50 MCG (2000 UT) TABS, Take 5,000 Units by mouth daily., Disp: , Rfl:    Cyanocobalamin (VITAMIN B-12) 1000 MCG SUBL, Take 2,000 mcg by mouth daily., Disp: , Rfl:    EPINEPHrine 0.3 mg/0.3 mL IJ SOAJ injection, Inject 0.3 mg into the muscle as needed for anaphylaxis., Disp: , Rfl:    hydrochlorothiazide (HYDRODIURIL) 25 MG tablet, TAKE 1 TABLET DAILY, Disp: 90 tablet, Rfl: 3   meloxicam (MOBIC) 15 MG tablet, TAKE 1 TABLET DAILY WITH FOOD AS NEEDED FOR PAIN, Disp: 90 tablet, Rfl: 3   methocarbamol (ROBAXIN) 500 MG tablet, Take 500 mg by mouth 3 (three) times daily., Disp: , Rfl:    omeprazole (PRILOSEC) 40 MG capsule, Take 1 capsule (40 mg total) by mouth daily before breakfast., Disp: 90 capsule, Rfl: 3   ondansetron (ZOFRAN-ODT) 4 MG disintegrating tablet, Take 4 mg by mouth every 8 (eight) hours as needed., Disp: ,  Rfl:    oxyCODONE-acetaminophen (PERCOCET) 10-325 MG tablet, Take 1 tablet by mouth every 4 (four) hours as needed for pain., Disp: , Rfl:    polyethylene glycol (MIRALAX / GLYCOLAX) 17 g packet, Take 1 packet by mouth daily. , Disp: , Rfl:    polyethylene glycol-electrolytes (NULYTELY) 420 g solution, Take 4,000 mLs by mouth once for 1 dose., Disp: 4000 mL, Rfl: 0   pregabalin (LYRICA) 300 MG capsule, Take 300 mg by mouth daily., Disp: , Rfl:    rOPINIRole (REQUIP) 1 MG tablet, Take 1 mg by mouth in the morning and at bedtime., Disp: , Rfl:    rOPINIRole (REQUIP) 4 MG tablet, TAKE 1 TABLET AT BEDTIME, Disp: 90 tablet, Rfl: 3   simvastatin (ZOCOR) 20 MG tablet, TAKE 1 TABLET AT BEDTIME, Disp: 90 tablet, Rfl: 3   Family History  Problem Relation Age of Onset   Alcohol abuse Mother    Cancer Mother    Stroke Mother    Alcohol abuse Father    Arthritis Sister    Arthritis Daughter    COPD Daughter    Arthritis Paternal Grandmother    Asthma Paternal Grandmother    COPD Paternal Grandmother    Breast cancer Paternal Aunt      Social History   Tobacco Use   Smoking status: Never   Smokeless tobacco: Never  Vaping Use   Vaping Use: Never used  Substance Use Topics   Alcohol use: Not Currently   Drug use: Never    Allergies as of 02/16/2022 - Review Complete 02/16/2022  Allergen Reaction Noted   Bee venom Anaphylaxis and Hives 12/19/2009   Fosamax [alendronate] Nausea Only 03/13/2020   Ciprofloxacin hcl Rash 12/29/2016   Sulfa antibiotics Rash 06/10/2012    Review of Systems:    All systems reviewed and negative except where noted in HPI.   Physical Exam:  BP 122/73 (BP Location: Left Arm, Patient Position: Sitting, Cuff Size: Normal)   Pulse 87   Temp 97.8 F (36.6 C) (Oral)   Ht 4' 10"  (1.473 m)   Wt 176 lb 2 oz (79.9 kg)   LMP  (LMP Unknown)   BMI 36.81 kg/m  No LMP recorded (lmp unknown). Patient has had a hysterectomy.  General:   Alert,  Well-developed,  well-nourished, pleasant and cooperative in NAD Head:  Normocephalic and atraumatic. Eyes:  Sclera clear, no icterus.   Conjunctiva pink. Ears:  Normal  auditory acuity. Nose:  No deformity, discharge, or lesions. Mouth:  No deformity or lesions,oropharynx pink & moist. Neck:  Supple; no masses or thyromegaly. Lungs:  Respirations even and unlabored.  Clear throughout to auscultation.   No wheezes, crackles, or rhonchi. No acute distress. Heart:  Regular rate and rhythm; no murmurs, clicks, rubs, or gallops. Abdomen:  Normal bowel sounds. Soft, nontender and moderately distended, tympanic to percussion without masses, hepatosplenomegaly or hernias noted.  No guarding or rebound tenderness.  Right flank fullness is noticeable, it was tympanic to percussion, nontender, soft, wearing brace for back support given recent surgery Rectal: Not performed Msk:  Symmetrical without gross deformities.  Decreased movement in her right lower extremity due to knee osteoarthritis Pulses:  Normal pulses noted. Extremities:  No clubbing or edema.  No cyanosis. Neurologic:  Alert and oriented x3;  grossly normal neurologically. Skin:  Intact without significant lesions or rashes. No jaundice. Psych:  Alert and cooperative. Normal mood and affect.  Imaging Studies: Reviewed  Assessment and Plan:   Erin Petersen is a 81 y.o. female with history of metabolic syndrome, history of hernia repair, bilateral oophorectomy, interstitial cystitis, chronic GERD, history of duodenal ulcer, chronic constipation, history of iron deficiency anemia is seen in consultation for worsening of constipation  Worsening of constipation: Likely due to restricted mobility, recent spine surgery Reassured patient about the CT scan findings and her abdominal distention is secondary to severe constipation We discussed about cleanout with GoLytely and 1 day of clear liquid diet and she agreed to do so After cleanout, advised her to take  MiraLAX 34 g daily Tried Linzess in the past, did not work and was expensive  Chronic GERD with recent flareup Continue omeprazole 40 mg 1-2 times daily Reiterated on antireflux lifestyle Upper endoscopy in 06/2019 was unremarkable Will repeat EGD if her symptoms are persistent or progressive  History of iron deficiency anemia: Responding to iron replacement  Tubular adenomas of the colon Recommend surveillance colonoscopy in 10/2023  Follow up in 6 months   Erin Darby, MD

## 2022-02-16 NOTE — Patient Instructions (Signed)
Golytely instructions- Fill to the fill line with water or Gatorade and mix together. Drink 8oz every 20 to 30 minutes till gone.

## 2022-02-17 ENCOUNTER — Encounter: Payer: Self-pay | Admitting: Gastroenterology

## 2022-02-17 MED ORDER — PEG 3350-KCL-NA BICARB-NACL 420 G PO SOLR: 4000.0000 mL | Freq: Once | ORAL | 0 refills | Status: AC

## 2022-02-20 ENCOUNTER — Encounter: Payer: Self-pay | Admitting: Gastroenterology

## 2022-03-02 ENCOUNTER — Ambulatory Visit
Admission: RE | Admit: 2022-03-02 | Discharge: 2022-03-02 | Disposition: A | Discharge status: Home / Self Care | Payer: Self-pay | Source: Ambulatory Visit | Attending: Neurosurgery | Admitting: Neurosurgery

## 2022-03-02 ENCOUNTER — Other Ambulatory Visit: Payer: Self-pay

## 2022-03-02 DIAGNOSIS — Z049 Encounter for examination and observation for unspecified reason: Secondary | ICD-10-CM

## 2022-03-02 MED ORDER — RIFAXIMIN 550 MG PO TABS: 550.0000 mg | ORAL_TABLET | Freq: Three times a day (TID) | ORAL | 0 refills | Status: DC

## 2022-03-02 NOTE — Addendum Note (Signed)
Addended by: Ulyess Blossom L on: 03/02/2022 01:28 PM   Modules accepted: Orders

## 2022-03-06 MED ORDER — METRONIDAZOLE 500 MG PO TABS: 500.0000 mg | ORAL_TABLET | Freq: Two times a day (BID) | ORAL | 0 refills | Status: DC

## 2022-03-06 NOTE — Addendum Note (Signed)
Addended by: Ulyess Blossom L on: 03/06/2022 11:19 AM   Modules accepted: Orders

## 2022-03-14 DIAGNOSIS — M5431 Sciatica, right side: Secondary | ICD-10-CM | POA: Diagnosis not present

## 2022-03-14 DIAGNOSIS — M4326 Fusion of spine, lumbar region: Secondary | ICD-10-CM | POA: Diagnosis not present

## 2022-03-14 DIAGNOSIS — M5432 Sciatica, left side: Secondary | ICD-10-CM | POA: Diagnosis not present

## 2022-03-14 DIAGNOSIS — M8008XA Age-related osteoporosis with current pathological fracture, vertebra(e), initial encounter for fracture: Secondary | ICD-10-CM | POA: Diagnosis not present

## 2022-03-16 ENCOUNTER — Encounter: Payer: Self-pay | Admitting: Neurosurgery

## 2022-03-16 ENCOUNTER — Other Ambulatory Visit: Payer: Self-pay

## 2022-03-16 ENCOUNTER — Ambulatory Visit
Admission: RE | Admit: 2022-03-16 | Discharge: 2022-03-16 | Disposition: A | Discharge status: Home / Self Care | Payer: Self-pay | Source: Ambulatory Visit | Attending: Neurosurgery | Admitting: Neurosurgery

## 2022-03-16 ENCOUNTER — Ambulatory Visit: Payer: Medicare Other | Admitting: Neurosurgery

## 2022-03-16 VITALS — BP 168/90 | HR 75 | Ht <= 58 in | Wt 169.4 lb

## 2022-03-16 DIAGNOSIS — Z049 Encounter for examination and observation for unspecified reason: Secondary | ICD-10-CM

## 2022-03-16 DIAGNOSIS — M532X4 Spinal instabilities, thoracic region: Secondary | ICD-10-CM | POA: Diagnosis not present

## 2022-03-16 DIAGNOSIS — S22080A Wedge compression fracture of T11-T12 vertebra, initial encounter for closed fracture: Secondary | ICD-10-CM

## 2022-03-16 DIAGNOSIS — M960 Pseudarthrosis after fusion or arthrodesis: Secondary | ICD-10-CM

## 2022-03-16 DIAGNOSIS — M419 Scoliosis, unspecified: Secondary | ICD-10-CM

## 2022-03-16 DIAGNOSIS — M4014 Other secondary kyphosis, thoracic region: Secondary | ICD-10-CM

## 2022-03-16 DIAGNOSIS — Z981 Arthrodesis status: Secondary | ICD-10-CM | POA: Diagnosis not present

## 2022-03-16 DIAGNOSIS — S22080S Wedge compression fracture of T11-T12 vertebra, sequela: Secondary | ICD-10-CM

## 2022-03-16 NOTE — Progress Notes (Signed)
Referring Physician:  No referring provider defined for this encounter.  Primary Physician:  Petersen, Erin Fanny, MD  History of Present Illness: 03/16/2022 [name] is here today with a chief complaint of back pain.  She was originally seen in clinic by me in May 2023 which is copied below.  She ultimately went and saw surgeons at another practice and underwent surgery in September and October 2023.  She had surgery with Dr. Patrice Paradise and Dr. Sherlyn Lick.  Most recent surgery 12/14/2021 with revision T12-S1 PSF. Had L2-4 OLIF by Dr. Patrice Paradise on 11/14/2021.  She has been using a bone stimulator.  She has constant back pain.  She is to stand up straight.  This has had a substantial impact on her ability to go about her day-to-day life.  She has been told that she has a screw that is not holding appropriately.  She has tried physical therapy since surgery without improvements.    I have utilized the care everywhere function in epic to review the outside records available from external health systems.  07/14/2021 [name] is here today with a chief complaint of pain in her low back, left buttocks, and left lateral thigh. She also reports thoracic pain and numbness and tingling in both legs.  She has been having pain for several years but worsening over the past 6 months. She reports tingling and numb discomfort with heaviness in her legs. Standing and walking make it worse. Bending and lifting make it worse. She has no symptoms in her upper extremities. Sitting down and laying down make it better.  Bowel/Bladder Dysfunction: none  Conservative measures: back brace, ice Physical therapy: currently participating (initial visit 06/14/21) Multimodal medical therapy including regular antiinflammatories: meloxicam, pregabalin, tizanidine,methocarbamol Injections: has received epidural steroid injections 06/02/21: bilateral L3-4 TFESI by Dr Alba Destine (no relief)  Past Surgery: lumbar fusion in  2002 in [place] has no symptoms of cervical myelopathy.  The symptoms are causing a significant impact on the patient's life.  Review of Systems:  A 10 point review of systems is negative, except for the pertinent positives and negatives detailed in the HPI.  Past Medical History: Past Medical History:  Diagnosis Date   Arthritis    Asthma    exercise induced asthma   Bacteremia due to Gram-negative bacteria 06/04/2019   Colon polyps    Diabetes mellitus without complication (HCC)    Duodenal ulcer    Dyspnea    GERD (gastroesophageal reflux disease)    Hyperlipidemia    Hypertension    Neuropathy    right leg and foot   Osteopenia    Plantar fasciitis    Pre-diabetes    Prediabetes    RLQ abdominal pain    RLS (restless legs syndrome)    Sleep apnea    CPAP   SOB (shortness of breath)    Urinary incontinence    Urinary tract infection    Wears hearing aid in right ear     Past Surgical History: Past Surgical History:  Procedure Laterality Date   [place]  2002   lumbar fusion   BILATERAL SALPINGOOPHORECTOMY  01/07/2018   BIOPSY  04/15/2019   Procedure: BIOPSY;  Surgeon: [name], MD;  Location: [facility];  Service: Endoscopy;;   CARDIAC CATHETERIZATION  2019   COLONOSCOPY WITH PROPOFOL N/A 11/11/2018   Procedure: COLONOSCOPY WITH PROPOFOL;  Surgeon: Marius Ditch,  Tally Due, MD;  Location: [facility];  Service: Gastroenterology;  Laterality: N/A;   CYSTO WITH HYDRODISTENSION N/A 12/30/2018   Procedure: CYSTOSCOPY/HYDRODISTENSION WITH MARCAINE;  Surgeon: Bjorn Loser, MD;  Location: [facility];  Service: Urology;  Laterality: N/A;   ESOPHAGOGASTRODUODENOSCOPY (EGD) WITH PROPOFOL N/A 04/15/2019   Procedure: ESOPHAGOGASTRODUODENOSCOPY (EGD) WITH PROPOFOL;  Surgeon: [name], MD;  Location: [facility];  Service: Endoscopy;  Laterality: N/A;  sleep  apnea   ESOPHAGOGASTRODUODENOSCOPY (EGD) WITH PROPOFOL N/A 07/09/2019   Procedure: ESOPHAGOGASTRODUODENOSCOPY (EGD) WITH PROPOFOL;  Surgeon: [name], MD;  Location: [facility];  Service: Gastroenterology;  Laterality: N/A;   EYE SURGERY Bilateral    HERNIA REPAIR  2018   abd   HERNIA REPAIR     umbilical   INGUINAL HERNIA REPAIR Bilateral    inguinal   JOINT REPLACEMENT     LAPAROSCOPIC BILATERAL SALPINGO OOPHERECTOMY  2019   LUMBAR FUSION  11/14/2021   Dr Rennis Harding   LUMBAR FUSION  12/14/2021   Dr Ivan Croft   SPINAL FUSION  2002   TONSILLECTOMY AND ADENOIDECTOMY  1961   TOTAL KNEE ARTHROPLASTY Right 01/01/2020   Procedure: Right total knee arthroplasty - [name] to Assist;  Surgeon: Hessie Knows, MD;  Location: [facility];  Service: Orthopedics;  Laterality: Right;   TOTAL KNEE ARTHROPLASTY Left 10/12/2020   Procedure: TOTAL KNEE ARTHROPLASTY;  Surgeon: Hessie Knows, MD;  Location: [facility];  Service: Orthopedics;  Laterality: Left;   TUBAL LIGATION      Allergies: Allergies as of 03/16/2022 - Review Complete 03/16/2022  Allergen Reaction Noted   Bee venom Anaphylaxis and Hives 12/19/2009   Fosamax [alendronate] Nausea Only 03/13/2020   Ciprofloxacin hcl Rash 12/29/2016   Sulfa antibiotics Rash 06/10/2012    Medications: Current Meds  Medication Sig   Acetaminophen (TYLENOL PO) Take by mouth as needed.   IBUPROFEN PO Take by mouth as needed.    Social History: Social History   Tobacco Use   Smoking status: Never   Smokeless tobacco: Never  Vaping Use   Vaping Use: Never used  Substance Use Topics   Alcohol use: Not Currently   Drug use: Never    Family Medical History: Family History  Problem Relation Age of Onset   Alcohol abuse Mother    Cancer Mother    Stroke Mother    Alcohol abuse Father    Arthritis Sister    Arthritis Daughter    COPD Daughter    Arthritis Paternal Grandmother    Asthma Paternal Grandmother    COPD  Paternal Grandmother    Breast cancer Paternal Aunt     Physical Examination: Vitals:   03/16/22 0936  BP: (!) 168/90  Pulse: 75    General: Patient is well developed, well nourished, calm, collected, and in no apparent distress. Attention to examination is appropriate.  Neck:   Supple.  Full range of motion.  Respiratory: Patient is breathing without any difficulty.   NEUROLOGICAL:     Awake, alert, oriented to person, place, and time.  Speech is clear and fluent.   Cranial Nerves: Pupils equal round and reactive to light.  Facial tone is symmetric.  Facial sensation is symmetric. Shoulder shrug is symmetric. Tongue protrusion is midline.  There is no pronator drift.  ROM of spine: Stooped posture.    Strength: Side Biceps Triceps Deltoid Interossei Grip Wrist Ext. Wrist Flex.  R '5 5 5 5 5 5 5  '$ L '5 5 5 '$ 5  $'5 5 5   'E$ Side Iliopsoas Quads Hamstring PF DF EHL  R '5 5 5 5 5 5  '$ L '5 5 5 5 5 5   '$ Reflexes are 2+ and symmetric at the biceps, triceps, brachioradialis, patella and achilles.   Hoffman's is absent.   Bilateral upper and lower extremity sensation is intact to light touch.    No evidence of dysmetria noted.  Gait is antalgic.     Medical Decision Making  Imaging: MRI L spine 12/01/2021 IMPRESSION:  1. Subtle enhancement of descending right sided nerve roots just  below the conus. This raises the possibility of arachnoiditis.  Lumbar puncture may be of use for further evaluation.  2. Discectomy and fusion at L2-3, L3-4, L4-5, and L5-S1 without  significant residual or recurrent stenosis at these levels.  3. Mild right foraminal narrowing at T12-L1 is stable.    Electronically Signed    By: San Morelle M.D.    On: 12/01/2021 20:42   CT L spine 12/01/2021 IMPRESSION:  1. Interval L2-3 and L3-4 interbody fusion without evidence of  hardware complication. Moderate residual left neural foraminal  stenosis at L3-4 due to spurring.  2. Unchanged chronic  L1 compression fracture with retropulsion  resulting in mild spinal stenosis.  3. Remote, solid L4-S1 fusion.  4.  Aortic Atherosclerosis (ICD10-I70.0).    Electronically Signed    By: Logan Bores M.D.    On: 12/01/2021 17:20   I have personally reviewed the images and agree with the above interpretation.  Assessment and Plan: Ms. Malveaux is a pleasant 82 y.o. female with screw pullout at T12 suggesting pseudoarthrosis.  The tips of the screws extend into the disc base at T11-12.  She has a compression fracture at T12 and has developed a thoracic kyphosis that was not present on her prior x-rays.  This implies spinal instability.  It is also created acquired scoliosis.  She has tried physical therapy.  She has not improved.  Given her changes on radiographs, I do not think she has another reasonable option other than revision of her posterior fusion.  We discussed that that would involve removal and replacement of all her implants with a likely T10 to the pelvis posterior instrumentation with cement augmentation of the top screws.  To finalize my plan, I have recommended scoliosis x-rays as well as a thoracic spine CT scan.  Will then review with her and schedule for operative intervention.  I spent a total of 40 minutes in this patient's care today. This time was spent reviewing pertinent records including imaging studies, obtaining and confirming history, performing a directed evaluation, formulating and discussing my recommendations, and documenting the visit within the medical record.    Thank you for involving me in the care of this patient.      Gonzalo Waymire K. Izora Ribas MD, [facility] Neurosurgery

## 2022-03-17 ENCOUNTER — Ambulatory Visit
Admission: RE | Admit: 2022-03-17 | Discharge: 2022-03-17 | Disposition: A | Discharge status: Home / Self Care | Payer: Medicare Other | Attending: Neurosurgery | Admitting: Neurosurgery

## 2022-03-17 ENCOUNTER — Ambulatory Visit
Admission: RE | Admit: 2022-03-17 | Discharge: 2022-03-17 | Disposition: A | Discharge status: Home / Self Care | Payer: Medicare Other | Source: Ambulatory Visit | Attending: Neurosurgery | Admitting: Neurosurgery

## 2022-03-17 DIAGNOSIS — M419 Scoliosis, unspecified: Secondary | ICD-10-CM | POA: Diagnosis not present

## 2022-03-17 DIAGNOSIS — M960 Pseudarthrosis after fusion or arthrodesis: Secondary | ICD-10-CM | POA: Insufficient documentation

## 2022-03-17 DIAGNOSIS — Z981 Arthrodesis status: Secondary | ICD-10-CM | POA: Diagnosis not present

## 2022-03-17 DIAGNOSIS — M4014 Other secondary kyphosis, thoracic region: Secondary | ICD-10-CM | POA: Diagnosis not present

## 2022-03-17 DIAGNOSIS — S22080S Wedge compression fracture of T11-T12 vertebra, sequela: Secondary | ICD-10-CM | POA: Insufficient documentation

## 2022-03-17 DIAGNOSIS — M4325 Fusion of spine, thoracolumbar region: Secondary | ICD-10-CM | POA: Diagnosis not present

## 2022-03-23 ENCOUNTER — Telehealth: Payer: Self-pay

## 2022-03-23 NOTE — Telephone Encounter (Signed)
Per Dr Izora Ribas: 2/1 at 4:15pm and move her up if someone cancels that day. OK to use a new patient slot that day if one opens up.

## 2022-03-23 NOTE — Telephone Encounter (Signed)
-----  Message from Peggyann Shoals sent at 03/23/2022  1:37 PM EST ----- Regarding: CT, work in appt Contact: [phone] Patient is scheduled for her CT on 1/30. She was told by Dr.Yarbrouth that as soon as she had the CT she would get a surgery date. She wants an appt so her and her daughter can discuss surgery with Dr.Yarbrough again.

## 2022-03-24 NOTE — Telephone Encounter (Signed)
Left message to call back  

## 2022-03-24 NOTE — Telephone Encounter (Signed)
She confirmed for 2/1 at 2pm

## 2022-03-26 ENCOUNTER — Other Ambulatory Visit: Payer: Self-pay | Admitting: Family Medicine

## 2022-03-28 ENCOUNTER — Ambulatory Visit
Admission: RE | Admit: 2022-03-28 | Discharge: 2022-03-28 | Disposition: A | Discharge status: Home / Self Care | Payer: Medicare Other | Source: Ambulatory Visit | Attending: Neurosurgery | Admitting: Neurosurgery

## 2022-03-28 DIAGNOSIS — M419 Scoliosis, unspecified: Secondary | ICD-10-CM | POA: Insufficient documentation

## 2022-03-28 DIAGNOSIS — Z981 Arthrodesis status: Secondary | ICD-10-CM | POA: Insufficient documentation

## 2022-03-28 DIAGNOSIS — M546 Pain in thoracic spine: Secondary | ICD-10-CM | POA: Diagnosis not present

## 2022-03-28 DIAGNOSIS — S22080S Wedge compression fracture of T11-T12 vertebra, sequela: Secondary | ICD-10-CM | POA: Insufficient documentation

## 2022-03-28 DIAGNOSIS — M4014 Other secondary kyphosis, thoracic region: Secondary | ICD-10-CM | POA: Insufficient documentation

## 2022-03-28 DIAGNOSIS — M960 Pseudarthrosis after fusion or arthrodesis: Secondary | ICD-10-CM | POA: Insufficient documentation

## 2022-03-30 ENCOUNTER — Ambulatory Visit: Payer: Medicare Other | Admitting: Neurosurgery

## 2022-03-30 ENCOUNTER — Other Ambulatory Visit: Payer: Self-pay

## 2022-03-30 ENCOUNTER — Encounter: Payer: Self-pay | Admitting: Neurosurgery

## 2022-03-30 VITALS — BP 148/80 | Ht <= 58 in | Wt 169.0 lb

## 2022-03-30 DIAGNOSIS — S22080D Wedge compression fracture of T11-T12 vertebra, subsequent encounter for fracture with routine healing: Secondary | ICD-10-CM

## 2022-03-30 DIAGNOSIS — S22080S Wedge compression fracture of T11-T12 vertebra, sequela: Secondary | ICD-10-CM

## 2022-03-30 DIAGNOSIS — M960 Pseudarthrosis after fusion or arthrodesis: Secondary | ICD-10-CM

## 2022-03-30 DIAGNOSIS — Z01818 Encounter for other preprocedural examination: Secondary | ICD-10-CM

## 2022-03-30 DIAGNOSIS — M532X4 Spinal instabilities, thoracic region: Secondary | ICD-10-CM | POA: Diagnosis not present

## 2022-03-30 DIAGNOSIS — M4014 Other secondary kyphosis, thoracic region: Secondary | ICD-10-CM

## 2022-03-30 NOTE — Patient Instructions (Signed)
Please see below for information in regards to your upcoming surgery:  Planned surgery: T10-pelvis fusion, T12-L1 posterior column osteotomy, possible L1-2 posterior column osteotomy   Surgery date: 04/17/22 - you will find out your arrival time the business day before your surgery.   Pre-op appointment at [facility]: we will call you with a date/time for this. Pre-admit testing is located on the first floor of the [facility], [place], [address]. Please bring all prescriptions in the original prescription bottles to your appointment, even if you have reviewed medications by phone with a pharmacy representative. Pre-op labs may be done at your pre-op appointment. You are not required to fast for these labs. Should you need to change your pre-op appointment, please call Pre-admit testing at [phone].    Surgical clearance: we will send a clearance request to Dr Glori Bickers    Blood thinners:  stop aspirin 7 days prior, resume aspirin 14 days after    NSAIDS (Non-steroidal anti-inflammatory drugs): because you are having a fusion, no NSAIDS (such as ibuprofen, aleve, naproxen, meloxicam, diclofenac) for 3 months after surgery. Celebrex is an exception. Tylenol is ok because it is not an NSAID.    Because you are having a fusion: for appointments after your 2 week follow-up: please arrive at the [facility] outpatient imaging center ([place], [place]) or [facility] one hour prior to your appointment for x-rays. This applies to every appointment after your 2 week follow-up. Failure to do so may result in your appointment being rescheduled.    We can be reached by phone or mychart 8am-4pm, Monday-Friday. If you have any questions/concerns before or after surgery, you can reach [place] at [phone], or you can send a mychart message. If you have a concern after hours that cannot wait until normal  business hours, you can call [phone] and ask to page the neurosurgeon on call for [place].    Appointments/FMLA & disability paperwork: Home  Nurse: Ophelia Shoulder  Medical assistants: Lum Keas Physician Assistant's: [place] Surgeon: Meade Maw, MD

## 2022-03-30 NOTE — H&P (View-Only) (Signed)
Referring Physician:  No referring provider defined for this encounter.  Primary Physician:  Tower, Wynelle Fanny, MD  History of Present Illness: 03/30/2022 Erin Petersen presents today to discuss her imaging.  03/16/2022 Erin Petersen is here today with a chief complaint of back pain.  She was originally seen in clinic by me in May 2023 which is copied below.  She ultimately went and saw surgeons at another practice and underwent surgery in September and October 2023.  She had surgery with Dr. Patrice Paradise and Dr. Sherlyn Lick.  Most recent surgery 12/14/2021 with revision T12-S1 PSF. Had L2-4 OLIF by Dr. Patrice Paradise on 11/14/2021.  She has been using a bone stimulator.  She has constant back pain.  She is to stand up straight.  This has had a substantial impact on her ability to go about her day-to-day life.  She has been told that she has a screw that is not holding appropriately.  She has tried physical therapy since surgery without improvements.    I have utilized the care everywhere function in epic to review the outside records available from external health systems.  07/14/2021 Erin Petersen is here today with a chief complaint of pain in her low back, left buttocks, and left lateral thigh. She also reports thoracic pain and numbness and tingling in both legs.  She has been having pain for several years but worsening over the past 6 months. She reports tingling and numb discomfort with heaviness in her legs. Standing and walking make it worse. Bending and lifting make it worse. She has no symptoms in her upper extremities. Sitting down and laying down make it better.  Bowel/Bladder Dysfunction: none  Conservative measures: back brace, ice Physical therapy: currently participating (initial visit 06/14/21) Multimodal medical therapy including regular antiinflammatories: meloxicam, pregabalin, tizanidine,methocarbamol Injections: has received epidural steroid injections 06/02/21: bilateral L3-4  TFESI by Dr Alba Destine (no relief)  Past Surgery: lumbar fusion in 2002 in [place] has no symptoms of cervical myelopathy.  The symptoms are causing a significant impact on the patient's life.  Review of Systems:  A 10 point review of systems is negative, except for the pertinent positives and negatives detailed in the HPI.  Past Medical History: Past Medical History:  Diagnosis Date   Arthritis    Asthma    exercise induced asthma   Bacteremia due to Gram-negative bacteria 06/04/2019   Colon polyps    Diabetes mellitus without complication (HCC)    Duodenal ulcer    Dyspnea    GERD (gastroesophageal reflux disease)    Hyperlipidemia    Hypertension    Neuropathy    right leg and foot   Osteopenia    Plantar fasciitis    Pre-diabetes    Prediabetes    RLQ abdominal pain    RLS (restless legs syndrome)    Sleep apnea    CPAP   SOB (shortness of breath)    Urinary incontinence    Urinary tract infection    Wears hearing aid in right ear     Past Surgical History: Past Surgical History:  Procedure Laterality Date   [place]  2002   lumbar fusion   BILATERAL SALPINGOOPHORECTOMY  01/07/2018   BIOPSY  04/15/2019   Procedure: BIOPSY;  Surgeon: [name], MD;  Location: [place];  Service: Endoscopy;;   CARDIAC CATHETERIZATION  2019   COLONOSCOPY WITH PROPOFOL N/A  11/11/2018   Procedure: COLONOSCOPY WITH PROPOFOL;  Surgeon: [name], MD;  [facility] END[facility]COPY;  Service: Gastroenterology;  Laterality: N/A;   CYSTO WITH HYDRODISTENSION N/A 12/30/2018   Procedure: CYSTOSCOPY/HYDRODISTENSION WITH MARCAINE;  Surgeon: Bjorn Loser, MD;  Location: [facility];  Service: Urology;  Laterality: N/A;   ESOPHAGOGASTRODUODENOSCOPY (EGD) WITH PROPOFOL N/A 04/15/2019   Procedure: ESOPHAGOGASTRODUODENOSCOPY (EGD) WITH PROPOFOL;  Surgeon: [name], MD;  Location:  [facility];  Service: Endoscopy;  Laterality: N/A;  sleep apnea   ESOPHAGOGASTRODUODENOSCOPY (EGD) WITH PROPOFOL N/A 07/09/2019   Procedure: ESOPHAGOGASTRODUODENOSCOPY (EGD) WITH PROPOFOL;  Surgeon: [name], MD;  Location: [place];  Service: Gastroenterology;  Laterality: N/A;   EYE SURGERY Bilateral    HERNIA REPAIR  2018   abd   HERNIA REPAIR     umbilical   INGUINAL HERNIA REPAIR Bilateral    inguinal   JOINT REPLACEMENT     LAPAROSCOPIC BILATERAL SALPINGO OOPHERECTOMY  2019   LUMBAR FUSION  11/14/2021   Dr Rennis Harding   LUMBAR FUSION  12/14/2021   Dr Ivan Croft   SPINAL FUSION  2002   TONSILLECTOMY AND ADENOIDECTOMY  1961   TOTAL KNEE ARTHROPLASTY Right 01/01/2020   Procedure: Right total knee arthroplasty - [name] to Assist;  Surgeon: Hessie Knows, MD;  Location: [facility];  Service: Orthopedics;  Laterality: Right;   TOTAL KNEE ARTHROPLASTY Left 10/12/2020   Procedure: TOTAL KNEE ARTHROPLASTY;  Surgeon: Hessie Knows, MD;  Location: [facility];  Service: Orthopedics;  Laterality: Left;   TUBAL LIGATION      Allergies: Allergies as of 03/30/2022 - Review Complete 03/16/2022  Allergen Reaction Noted   Bee venom Anaphylaxis and Hives 12/19/2009   Fosamax [alendronate] Nausea Only 03/13/2020   Ciprofloxacin hcl Rash 12/29/2016   Sulfa antibiotics Rash 06/10/2012    Medications: No outpatient medications have been marked as taking for the 03/30/22 encounter (Office Visit) with Meade Maw, MD.    Social History: Social History   Tobacco Use   Smoking status: Never   Smokeless tobacco: Never  Vaping Use   Vaping Use: Never used  Substance Use Topics   Alcohol use: Not Currently   Drug use: Never    Family Medical History: Family History  Problem Relation Age of Onset   Alcohol abuse Mother    Cancer Mother    Stroke Mother    Alcohol abuse Father    Arthritis Sister    Arthritis Daughter    COPD Daughter    Arthritis  Paternal Grandmother    Asthma Paternal Grandmother    COPD Paternal Grandmother    Breast cancer Paternal Aunt     Physical Examination: Vitals:   03/30/22 1359  BP: (!) 148/80    General: Patient is well developed, well nourished, calm, collected, and in no apparent distress. Attention to examination is appropriate.  Neck:   Supple.  Full range of motion.  Respiratory: Patient is breathing without any difficulty.   NEUROLOGICAL:     Awake, alert, oriented to person, place, and time.  Speech is clear and fluent.   Cranial Nerves: Pupils equal round and reactive to light.  Facial tone is symmetric.  Facial sensation is symmetric. Shoulder shrug is symmetric. Tongue protrusion is midline.  There is no pronator drift.  ROM of spine: Stooped posture.    Strength: Side Biceps Triceps Deltoid Interossei Grip Wrist Ext. Wrist Flex.  R 5 5 5 5 5 5 5  $ L 5 5 5  $5 5 5 5   t$ Side Iliopsoas Quads Hamstring PF DF EHL  R 5 5 5 5 5 5  $ L 5 5 5 5 5 5   $ Reflexes are 2+ and symmetric at the biceps, triceps, brachioradialis, patella and achilles.   Hoffman's is absent.   Bilateral upper and lower extremity sensation is intact to light touch.    No evidence of dysmetria noted.  Gait is antalgic.     Medical Decision Making  Imaging: MRI L spine 12/01/2021 IMPRESSION:  1. Subtle enhancement of descending right sided nerve roots just  below the conus. This raises the possibility of arachnoiditis.  Lumbar puncture may be of use for further evaluation.  2. Discectomy and fusion at L2-3, L3-4, L4-5, and L5-S1 without  significant residual or recurrent stenosis at these levels.  3. Mild right foraminal narrowing at T12-L1 is stable.    Electronically Signed    By: San Morelle M.D.    On: 12/01/2021 20:42   CT L spine 12/01/2021 IMPRESSION:  1. Interval L2-3 and L3-4 interbody fusion without evidence of  hardware complication. Moderate residual left neural foraminal  stenosis  at L3-4 due to spurring.  2. Unchanged chronic L1 compression fracture with retropulsion  resulting in mild spinal stenosis.  3. Remote, solid L4-S1 fusion.  4.  Aortic Atherosclerosis (ICD10-I70.0).    Electronically Signed    By: Logan Bores M.D.    On: 12/01/2021 17:20   S[facility]iosis x-rays from March 17, 2022 shows a 25 degree kyphosis between the top of T11 and the bottom of T12.  This is new in comparison to prior imaging. IMPRESSION: 1. Previous T12 through S1 level fusion. Hardware appears intact, and stable from last month including T12 pedicle screws extending through the superior endplate in the setting of previous T12 compression fracture. 2. Chronic L1 compression fracture also. No acute osseous abnormality identified. 3. No significant scoliosis.     Electronically Signed   By: Genevie Ann M.D.   On: 03/18/2022 09:18  I have personally reviewed the images and agree with the above interpretation.  Assessment and Plan: Erin Petersen is a pleasant 82 y.o. female with screw pullout at T12 suggesting pseudoarthrosis.  She has compression fracture at T12.  She now has a 25 degree kyphosis between T11 and T12.  She did not have kyphosis on her prior x-rays.  The tips of the screws extend into the disc base at T11-12.   This implies spinal instability.  It is also created acquired scoliosis.  Will plan for T10 the pelvis posterior spinal fusion.  There is some possibility that we will need to extend the T9 depending on the strength of the fixation at T10 and T11.  I will plan for T12-L1 and possible L1-2 posterior column osteotomies to reestablish her normal alignment.  We will remove and replace all implants.  I spent a total of 40 minutes in this patient's care today. This time was spent reviewing pertinent records including imaging studies, obtaining and confirming history, performing a directed evaluation, formulating and discussing my recommendations, and documenting the visit  within the medical record.    Thank you for involving me in the care of this patient.      Erin Braggs K. Izora Ribas MD, [facility] Neurosurgery

## 2022-03-30 NOTE — Progress Notes (Signed)
  Referring Physician:  No referring provider defined for this encounter.  Primary Physician:  Tower, Marne A, MD  History of Present Illness: 03/30/2022 Erin Petersen presents today to discuss her imaging.  03/16/2022 Erin Petersen is here today with a chief complaint of back pain.  She was originally seen in clinic by me in May 2023 which is copied below.  She ultimately went and saw surgeons at another practice and underwent surgery in September and October 2023.  She had surgery with Dr. Cohen and Dr. Torrealba.  Most recent surgery 12/14/2021 with revision T12-S1 PSF. Had L2-4 OLIF by Dr. Cohen on 11/14/2021.  She has been using a bone stimulator.  She has constant back pain.  She is to stand up straight.  This has had a substantial impact on her ability to go about her day-to-day life.  She has been told that she has a screw that is not holding appropriately.  She has tried physical therapy since surgery without improvements.    I have utilized the care everywhere function in epic to review the outside records available from external health systems.  07/14/2021 Erin Petersen is here today with a chief complaint of pain in her low back, left buttocks, and left lateral thigh. She also reports thoracic pain and numbness and tingling in both legs.  She has been having pain for several years but worsening over the past 6 months. She reports tingling and numb discomfort with heaviness in her legs. Standing and walking make it worse. Bending and lifting make it worse. She has no symptoms in her upper extremities. Sitting down and laying down make it better.  Bowel/Bladder Dysfunction: none  Conservative measures: back brace, ice Physical therapy: currently participating (initial visit 06/14/21) Multimodal medical therapy including regular antiinflammatories: meloxicam, pregabalin, tizanidine,methocarbamol Injections: has received epidural steroid injections 06/02/21: bilateral L3-4  TFESI by Dr Morales (no relief)  Past Surgery: lumbar fusion in 2002 in [place]  Erin Petersen has no symptoms of cervical myelopathy.  The symptoms are causing a significant impact on the patient's life.  Review of Systems:  A 10 point review of systems is negative, except for the pertinent positives and negatives detailed in the HPI.  Past Medical History: Past Medical History:  Diagnosis Date   Arthritis    Asthma    exercise induced asthma   Bacteremia due to Gram-negative bacteria 06/04/2019   Colon polyps    Diabetes mellitus without complication (HCC)    Duodenal ulcer    Dyspnea    GERD (gastroesophageal reflux disease)    Hyperlipidemia    Hypertension    Neuropathy    right leg and foot   Osteopenia    Plantar fasciitis    Pre-diabetes    Prediabetes    RLQ abdominal pain    RLS (restless legs syndrome)    Sleep apnea    CPAP   SOB (shortness of breath)    Urinary incontinence    Urinary tract infection    Wears hearing aid in right ear     Past Surgical History: Past Surgical History:  Procedure Laterality Date   ABDOMINAL HYSTERECTOMY  1972   APPENDECTOMY  1958   BACK SURGERY  2002   lumbar fusion   BILATERAL SALPINGOOPHORECTOMY  01/07/2018   BIOPSY  04/15/2019   Procedure: BIOPSY;  Surgeon: Vanga, Rohini Reddy, MD;  Location: [facility];  Service: Endoscopy;;   CARDIAC CATHETERIZATION  2019   COLONOSCOPY WITH PROPOFOL N/A   11/11/2018   Procedure: COLONOSCOPY WITH PROPOFOL;  Surgeon: Vanga, Rohini Reddy, MD;  Location: [facility];  Service: Gastroenterology;  Laterality: N/A;   CYSTO WITH HYDRODISTENSION N/A 12/30/2018   Procedure: CYSTOSCOPY/HYDRODISTENSION WITH MARCAINE;  Surgeon: MacDiarmid, Scott, MD;  Location: [facility];  Service: Urology;  Laterality: N/A;   ESOPHAGOGASTRODUODENOSCOPY (EGD) WITH PROPOFOL N/A 04/15/2019   Procedure: ESOPHAGOGASTRODUODENOSCOPY (EGD) WITH PROPOFOL;  Surgeon: Vanga, Rohini Reddy, MD;  Location:  [facility];  Service: Endoscopy;  Laterality: N/A;  sleep apnea   ESOPHAGOGASTRODUODENOSCOPY (EGD) WITH PROPOFOL N/A 07/09/2019   Procedure: ESOPHAGOGASTRODUODENOSCOPY (EGD) WITH PROPOFOL;  Surgeon: Vanga, Rohini Reddy, MD;  Location: [facility];  Service: Gastroenterology;  Laterality: N/A;   EYE SURGERY Bilateral    HERNIA REPAIR  2018   abd   HERNIA REPAIR     umbilical   INGUINAL HERNIA REPAIR Bilateral    inguinal   JOINT REPLACEMENT     LAPAROSCOPIC BILATERAL SALPINGO OOPHERECTOMY  2019   LUMBAR FUSION  11/14/2021   Dr Max Cohen   LUMBAR FUSION  12/14/2021   Dr Ruben Torrealba   SPINAL FUSION  2002   TONSILLECTOMY AND ADENOIDECTOMY  1961   TOTAL KNEE ARTHROPLASTY Right 01/01/2020   Procedure: Right total knee arthroplasty - Chris Gaines to Assist;  Surgeon: Menz, Michael, MD;  Location: [facility];  Service: Orthopedics;  Laterality: Right;   TOTAL KNEE ARTHROPLASTY Left 10/12/2020   Procedure: TOTAL KNEE ARTHROPLASTY;  Surgeon: Menz, Michael, MD;  Location: [facility];  Service: Orthopedics;  Laterality: Left;   TUBAL LIGATION      Allergies: Allergies as of 03/30/2022 - Review Complete 03/16/2022  Allergen Reaction Noted   Bee venom Anaphylaxis and Hives 12/19/2009   Fosamax [alendronate] Nausea Only 03/13/2020   Ciprofloxacin hcl Rash 12/29/2016   Sulfa antibiotics Rash 06/10/2012    Medications: No outpatient medications have been marked as taking for the 03/30/22 encounter (Office Visit) with Huda Petrey, MD.    Social History: Social History   Tobacco Use   Smoking status: Never   Smokeless tobacco: Never  Vaping Use   Vaping Use: Never used  Substance Use Topics   Alcohol use: Not Currently   Drug use: Never    Family Medical History: Family History  Problem Relation Age of Onset   Alcohol abuse Mother    Cancer Mother    Stroke Mother    Alcohol abuse Father    Arthritis Sister    Arthritis Daughter    COPD Daughter    Arthritis  Paternal Grandmother    Asthma Paternal Grandmother    COPD Paternal Grandmother    Breast cancer Paternal Aunt     Physical Examination: Vitals:   03/30/22 1359  BP: (!) 148/80    General: Patient is well developed, well nourished, calm, collected, and in no apparent distress. Attention to examination is appropriate.  Neck:   Supple.  Full range of motion.  Respiratory: Patient is breathing without any difficulty.   NEUROLOGICAL:     Awake, alert, oriented to person, place, and time.  Speech is clear and fluent.   Cranial Nerves: Pupils equal round and reactive to light.  Facial tone is symmetric.  Facial sensation is symmetric. Shoulder shrug is symmetric. Tongue protrusion is midline.  There is no pronator drift.  ROM of spine: Stooped posture.    Strength: Side Biceps Triceps Deltoid Interossei Grip Wrist Ext. Wrist Flex.  R 5 5 5 5 5 5 5  L 5 5 5   5 5 5 5   Side Iliopsoas Quads Hamstring PF DF EHL  R 5 5 5 5 5 5  L 5 5 5 5 5 5   Reflexes are 2+ and symmetric at the biceps, triceps, brachioradialis, patella and achilles.   Hoffman's is absent.   Bilateral upper and lower extremity sensation is intact to light touch.    No evidence of dysmetria noted.  Gait is antalgic.     Medical Decision Making  Imaging: MRI L spine 12/01/2021 IMPRESSION:  1. Subtle enhancement of descending right sided nerve roots just  below the conus. This raises the possibility of arachnoiditis.  Lumbar puncture may be of use for further evaluation.  2. Discectomy and fusion at L2-3, L3-4, L4-5, and L5-S1 without  significant residual or recurrent stenosis at these levels.  3. Mild right foraminal narrowing at T12-L1 is stable.    Electronically Signed    By: Christopher  Mattern M.D.    On: 12/01/2021 20:42   CT L spine 12/01/2021 IMPRESSION:  1. Interval L2-3 and L3-4 interbody fusion without evidence of  hardware complication. Moderate residual left neural foraminal  stenosis  at L3-4 due to spurring.  2. Unchanged chronic L1 compression fracture with retropulsion  resulting in mild spinal stenosis.  3. Remote, solid L4-S1 fusion.  4.  Aortic Atherosclerosis (ICD10-I70.0).    Electronically Signed    By: Allen  Grady M.D.    On: 12/01/2021 17:20   [facility] x-rays from March 17, 2022 shows a 25 degree kyphosis between the top of T11 and the bottom of T12.  This is new in comparison to prior imaging. IMPRESSION: 1. Previous T12 through S1 level fusion. Hardware appears intact, and stable from last month including T12 pedicle screws extending through the superior endplate in the setting of previous T12 compression fracture. 2. Chronic L1 compression fracture also. No acute osseous abnormality identified. 3. No significant scoliosis.     Electronically Signed   By: H  Hall M.D.   On: 03/18/2022 09:18  I have personally reviewed the images and agree with the above interpretation.  Assessment and Plan: Erin Petersen is a pleasant 81 y.o. female with screw pullout at T12 suggesting pseudoarthrosis.  She has compression fracture at T12.  She now has a 25 degree kyphosis between T11 and T12.  She did not have kyphosis on her prior x-rays.  The tips of the screws extend into the disc base at T11-12.   This implies spinal instability.  It is also created acquired scoliosis.  Will plan for T10 the pelvis posterior spinal fusion.  There is some possibility that we will need to extend the T9 depending on the strength of the fixation at T10 and T11.  I will plan for T12-L1 and possible L1-2 posterior column osteotomies to reestablish her normal alignment.  We will remove and replace all implants.  I spent a total of 40 minutes in this patient's care today. This time was spent reviewing pertinent records including imaging studies, obtaining and confirming history, performing a directed evaluation, formulating and discussing my recommendations, and documenting the visit  within the medical record.    Thank you for involving me in the care of this patient.      Verania Salberg K. Curtina Grills MD, MPHS Neurosurgery 

## 2022-03-31 ENCOUNTER — Other Ambulatory Visit: Payer: Self-pay | Admitting: Family Medicine

## 2022-03-31 ENCOUNTER — Encounter: Payer: Self-pay | Admitting: Family Medicine

## 2022-03-31 MED ORDER — ONDANSETRON 4 MG PO TBDP
4.0000 mg | ORAL_TABLET | Freq: Three times a day (TID) | ORAL | 0 refills | Status: DC | PRN
  Filled 2022-03-31: qty 20, 7d supply, fill #0

## 2022-03-31 NOTE — Telephone Encounter (Signed)
On list as historical med, I don't see where PCP has filled med before, please advise

## 2022-04-01 ENCOUNTER — Other Ambulatory Visit (HOSPITAL_COMMUNITY): Payer: Self-pay

## 2022-04-03 ENCOUNTER — Encounter: Payer: Self-pay | Admitting: Neurosurgery

## 2022-04-03 ENCOUNTER — Other Ambulatory Visit: Payer: Self-pay

## 2022-04-03 ENCOUNTER — Other Ambulatory Visit (HOSPITAL_COMMUNITY): Payer: Self-pay

## 2022-04-04 ENCOUNTER — Encounter: Payer: Self-pay | Admitting: Neurosurgery

## 2022-04-04 ENCOUNTER — Encounter: Payer: Self-pay | Admitting: Family Medicine

## 2022-04-04 ENCOUNTER — Telehealth: Payer: Self-pay

## 2022-04-04 ENCOUNTER — Other Ambulatory Visit: Payer: Self-pay

## 2022-04-04 NOTE — Telephone Encounter (Signed)
I can clear her medically but she needs cardiology clearance also  Does she have a cardiologist or need a referral? I have the form on my desk

## 2022-04-04 NOTE — Telephone Encounter (Signed)
Sent message with PCP's questions but after sending the message I did see a cardiology note from [facility] cardiology in care everywhere dated 10/07/21, FYI to PCP

## 2022-04-04 NOTE — Telephone Encounter (Signed)
Form was placed in your inbox a few days ago

## 2022-04-04 NOTE — Telephone Encounter (Signed)
Done and in IN box 

## 2022-04-04 NOTE — Telephone Encounter (Signed)
Pt said the last time she had surgery a few months ago that her cardiologist did clear her. Pt is going to have surgeon send clearance papers to cardiologist (no referral needed) but pt does need [place] to go ahead and send in forms clearing her medically as PCP stated she would do.

## 2022-04-04 NOTE — Progress Notes (Signed)
Care Management & Coordination Services Pharmacy Team  Reason for Encounter: Appointment reschedule    Spoke with patient on 04/04/2022   Patient requests to reschedule March appointment due to she is having surgery and may be in rehabilitation. Appointment rescheduled to appropriate convenient time for the patient   Charlene Brooke, PharmD notified  Erin Petersen, [place] Assistant [phone]

## 2022-04-04 NOTE — Telephone Encounter (Signed)
She needs to schedule an appt there or reach out to them by phone Let me know if she needs a referral

## 2022-04-06 ENCOUNTER — Other Ambulatory Visit: Payer: Self-pay

## 2022-04-06 ENCOUNTER — Encounter
Admission: RE | Admit: 2022-04-06 | Discharge: 2022-04-06 | Disposition: A | Discharge status: Home / Self Care | Payer: Medicare Other | Source: Ambulatory Visit | Attending: Neurosurgery | Admitting: Neurosurgery

## 2022-04-06 VITALS — BP 156/70 | HR 69 | Resp 16 | Ht <= 58 in | Wt 167.3 lb

## 2022-04-06 DIAGNOSIS — Z01818 Encounter for other preprocedural examination: Secondary | ICD-10-CM | POA: Diagnosis not present

## 2022-04-06 DIAGNOSIS — Z01810 Encounter for preprocedural cardiovascular examination: Secondary | ICD-10-CM | POA: Diagnosis not present

## 2022-04-06 DIAGNOSIS — Z01812 Encounter for preprocedural laboratory examination: Secondary | ICD-10-CM

## 2022-04-06 HISTORY — DX: Other complications of anesthesia, initial encounter: T88.59XA

## 2022-04-06 HISTORY — DX: Pneumonia, unspecified organism: J18.9

## 2022-04-06 HISTORY — DX: Anemia, unspecified: D64.9

## 2022-04-06 LAB — BASIC METABOLIC PANEL
Anion gap: 10 (ref 5–15)
BUN: 32 mg/dL — ABNORMAL HIGH (ref 8–23)
CO2: 30 mmol/L (ref 22–32)
Calcium: 10.1 mg/dL (ref 8.9–10.3)
Chloride: 92 mmol/L — ABNORMAL LOW (ref 98–111)
Creatinine, Ser: 0.7 mg/dL (ref 0.44–1.00)
GFR, Estimated: >60 mL/min (ref >60)
Glucose, Bld: 103 mg/dL — ABNORMAL HIGH (ref 70–99)
Potassium: 3.3 mmol/L — ABNORMAL LOW (ref 3.5–5.1)
Sodium: 132 mmol/L — ABNORMAL LOW (ref 135–145)

## 2022-04-06 LAB — SURGICAL PCR SCREEN
MRSA, PCR: NEGATIVE
Staphylococcus aureus: NEGATIVE

## 2022-04-06 LAB — CBC
HCT: 35.1 % — ABNORMAL LOW (ref 36.0–46.0)
Hemoglobin: 10.9 g/dL — ABNORMAL LOW (ref 12.0–15.0)
MCH: 22.9 pg — ABNORMAL LOW (ref 26.0–34.0)
MCHC: 31.1 g/dL (ref 30.0–36.0)
MCV: 73.6 fL — ABNORMAL LOW (ref 80.0–100.0)
Platelets: 433 10*3/uL — ABNORMAL HIGH (ref 150–400)
RBC: 4.77 MIL/uL (ref 3.87–5.11)
RDW: 17.4 % — ABNORMAL HIGH (ref 11.5–15.5)
WBC: 6.2 10*3/uL (ref 4.0–10.5)
nRBC: 0 % (ref 0.0–0.2)

## 2022-04-06 LAB — URINALYSIS, ROUTINE W REFLEX MICROSCOPIC
Bilirubin Urine: NEGATIVE
Glucose, UA: NEGATIVE mg/dL
Hgb urine dipstick: NEGATIVE
Ketones, ur: NEGATIVE mg/dL
Leukocytes,Ua: NEGATIVE
Nitrite: NEGATIVE
Protein, ur: NEGATIVE mg/dL
Specific Gravity, Urine: 1.024 (ref 1.005–1.030)
pH: 5 (ref 5.0–8.0)

## 2022-04-06 NOTE — Patient Instructions (Addendum)
Your procedure is scheduled on: 04/17/22 - Monday Report to the Registration Desk on the 1st floor of the [facility]. To find out your arrival time, please call [phone] between 1PM - 3PM on: 04/14/22 - Friday If your arrival time is 6:00 am, do not arrive before that time as the [facility] entrance doors do not open until 6:00 am.  REMEMBER: Instructions that are not followed completely may result in serious medical risk, up to and including death; or upon the discretion of your surgeon and anesthesiologist your surgery may need to be rescheduled.  Do not eat food after midnight the night before surgery.  No gum chewing or hard candies.  You may however, drink CLEAR liquids up to 2 hours before you are scheduled to arrive for your surgery. Do not drink anything within 2 hours of your scheduled arrival time.  Clear liquids include: - water  - apple juice without pulp - gatorade (not RED colors) - black coffee or tea (Do NOT add milk or creamers to the coffee or tea) Do NOT drink anything that is not on this list.  One week prior to surgery beginning 04/10/22,  Stop meloxicam (MOBIC) and Anti-inflammatories (NSAIDS) such as Advil, Aleve, Ibuprofen, Motrin, Naproxen, Naprosyn and Aspirin based products such as Excedrin, Goody's Powder, BC Powder.  Stop ANY OVER THE COUNTER supplements until after surgery beginning 04/10/22.  You may however, continue to take Tylenol if needed for pain up until the day of surgery.  Continue taking all prescribed medications with the exception of the following:  stop aspirin 7 days prior, resume aspirin 14 days after,    TAKE ONLY THESE MEDICATIONS THE MORNING OF SURGERY WITH A SIP OF WATER:  amLODipine (NORVASC)  omeprazole (PRILOSEC)  (take one the night before and one on the morning of surgery - helps to prevent nausea after surgery.) pregabalin (LYRICA)  rOPINIRole (REQUIP)   Use inhaler albuterol (VENTOLIN HFA) on the day of surgery  and bring to the hospital.  No Alcohol for 24 hours before or after surgery.  No Smoking including e-cigarettes for 24 hours before surgery.  No chewable tobacco products for at least 6 hours before surgery.  No nicotine patches on the day of surgery.  Do not use any "recreational" drugs for at least a week (preferably 2 weeks) before your surgery.  Please be advised that the combination of cocaine and anesthesia may have negative outcomes, up to and including death. If you test positive for cocaine, your surgery will be cancelled.  On the morning of surgery brush your teeth with toothpaste and water, you may rinse your mouth with mouthwash if you wish. Do not swallow any toothpaste or mouthwash.  Use CHG Soap or wipes as directed on instruction sheet.  Do not wear jewelry, make-up, hairpins, clips or nail polish.  Do not wear lotions, powders, or perfumes.   Do not shave body hair from the neck down 48 hours before surgery.  Contact lenses, hearing aids and dentures may not be worn into surgery.  Do not bring valuables to the hospital. [facility] is not responsible for any missing/lost belongings or valuables.   Bring your C-PAP to the hospital in case you may have to spend the night.   Notify your doctor if there is any change in your medical condition (cold, fever, infection).  Wear comfortable clothing (specific to your surgery type) to the hospital.  After surgery, you can help prevent lung complications by doing breathing exercises.  Take deep breaths and cough every 1-2 hours. Your doctor may order a device called an Incentive Spirometer to help you take deep breaths. When coughing or sneezing, hold a pillow firmly against your incision with both hands. This is called "splinting." Doing this helps protect your incision. It also decreases belly discomfort.  If you are being admitted to the hospital overnight, leave your suitcase in the car. After surgery it may be brought  to your room.  In case of increased patient census, it may be necessary for you, the patient, to continue your postoperative care in the Same Day Surgery department.  If you are being discharged the day of surgery, you will not be allowed to drive home. You will need a responsible individual to drive you home and stay with you for 24 hours after surgery.   If you are taking public transportation, you will need to have a responsible individual with you.  Please call the [facility] at [phone] if you have any questions about these instructions.  Surgery Visitation Policy:  Patients undergoing a surgery or procedure may have two family members or support persons with them as long as the person is not COVID-19 positive or experiencing its symptoms.   Inpatient Visitation:    Visiting hours are 7 a.m. to 8 p.m. Up to four visitors are allowed at one time in a patient room. The visitors may rotate out with other people during the day. One designated support person (adult) may remain overnight.  Due to an increase in RSV and influenza rates and associated hospitalizations, children ages 54 and under will not be able to visit patients in [facility].  Masks continue to be strongly recommended.    Preparing for Surgery with CHLORHEXIDINE GLUCONATE (CHG) Soap  Chlorhexidine Gluconate (CHG) Soap  o An antiseptic cleaner that kills germs and bonds with the skin to continue killing germs even after washing  o Used for showering the night before surgery and morning of surgery  Before surgery, you can play an important role by reducing the number of germs on your skin.  CHG (Chlorhexidine gluconate) soap is an antiseptic cleanser which kills germs and bonds with the skin to continue killing germs even after washing.  Please do not use if you have an allergy to CHG or antibacterial soaps. If your skin becomes reddened/irritated stop using the CHG.  1. Shower  the NIGHT BEFORE SURGERY and the MORNING OF SURGERY with CHG soap.  2. If you choose to wash your hair, wash your hair first as usual with your normal shampoo.  3. After shampooing, rinse your hair and body thoroughly to remove the shampoo.  4. Use CHG as you would any other liquid soap. You can apply CHG directly to the skin and wash gently with a scrungie or a clean washcloth.  5. Apply the CHG soap to your body only from the neck down. Do not use on open wounds or open sores. Avoid contact with your eyes, ears, mouth, and genitals (private parts). Wash face and genitals (private parts) with your normal soap.  6. Wash thoroughly, paying special attention to the area where your surgery will be performed.  7. Thoroughly rinse your body with warm water.  8. Do not shower/wash with your normal soap after using and rinsing off the CHG soap.  9. Pat yourself dry with a clean towel.  10. Wear clean pajamas to bed the night before surgery.  12. Place clean sheets on  your bed the night of your first shower and do not sleep with pets.  13. Shower again with the CHG soap on the day of surgery prior to arriving at the hospital.  14. Do not apply any deodorants/lotions/powders.  15. Please wear clean clothes to the hospital.

## 2022-04-07 NOTE — Pre-Procedure Instructions (Signed)
Patient called today and reported that she had a direct Covid exposure on 04/04/22, she has complaints today of upset stomach, she did a home Covid test which she reports Is negative. She has been advised to retest with worsening/new symptoms, and notify Dr. Rhea Bleacher office with positive results and /or any symptoms that could effect her surgery, her surgery is scheduled for 04/17/22 which places her outside of the 11 days push out for Covid exposure.

## 2022-04-12 ENCOUNTER — Encounter: Payer: Self-pay | Admitting: Neurosurgery

## 2022-04-12 NOTE — Progress Notes (Signed)
Perioperative / Anesthesia Services  Pre-Admission Testing Clinical Review / Preoperative Anesthesia Consult  Date: 04/13/22  Patient Demographics:  Name: Erin Petersen DOB:   08-13-1940 MRN:   [id number]  Planned Surgical Procedure(s):    Case: [id number] Date/Time: 04/17/22 1001   Procedures:      T10-PELVIS FUSION, T12-L1 POSTERIOR COLUMN OSTEOTOMY, POSSIBLE L1-2 POSTERIOR COLUMN OSTEOTOMY     APPLICATION OF INTRAOPERATIVE CT SCAN   Anesthesia type: General   Pre-op diagnosis:      M96.0 Pseudoarthrosis of thoracic spine after fusion procedure     M40.14 Other secondary kyphosis, thoracic region     S22.080S Compression fracture of T12 vertebra, sequela     M53.2X4 Spinal instability, thoracic   Location: [facility] OR ROOM 03 / [place] [facility]   Surgeons: Erin Maw, MD   NOTE: Available PAT nursing documentation and vital signs have been reviewed. Clinical nursing staff has updated patient's PMH/PSHx, current medication list, and drug allergies/intolerances to ensure comprehensive history available to assist in medical decision making as it pertains to the aforementioned surgical procedure and anticipated anesthetic course. Extensive review of available clinical information personally performed. Erin Petersen PMH and PSHx updated with any diagnoses/procedures that  may have been inadvertently omitted during her intake with the pre-admission testing department's nursing staff.  Clinical Discussion:  Erin Petersen is a 82 y.o. female who is submitted for pre-surgical anesthesia review and clearance prior to her undergoing the above procedure. Patient has never been a smoker. Pertinent PMH includes: CAD, diastolic dysfunction, pulmonary hypertension, HTN, HLD, T2DM, asthma, COPD, OSAH (requires nocturnal PAP therapy), GERD (on daily PPI), anemia, OA, pseudoarthrosis of thoracic spine, chronic T12 and L1 compression fractures, RLS, insomnia.  Patient is followed by  cardiology Erin Massed, MD). She was last seen in the cardiology clinic on 10/07/2021; notes reviewed. At the time of her clinic visit, patient doing well overall from a cardiovascular perspective. Patient denied any chest pain, shortness of breath, PND, orthopnea, palpitations, significant peripheral edema, weakness, fatigue, vertiginous symptoms, or presyncope/syncope. Patient with a past medical history significant for cardiovascular diagnoses. Documented physical exam was grossly benign, providing no evidence of acute exacerbation and/or decompensation of the patient's known cardiovascular conditions.  Diagnostic LEFT heart catheterization performed on 07/21/2016 revealed mild nonobstructive CAD.  Given the degree and nonobstructive nature of her disease, intervention was deferred opting for medical management.    Most recent TTE was performed on 06/04/2019 revealing a normal left ventricular systolic function with an EF of 55-60%.  There were no regional wall motion abnormalities.  Left ventricular diastolic Doppler parameters normal.  Right ventricular size and function normal.  There was a moderately elevated PASP of 40.3 mmHg.  Left atrium mildly dilated.  No significant valvular regurgitation.  There was no evidence of significant transvalvular gradients providing evidence suggestive of valvular stenosis.  Blood pressure mildly elevated at 140/84 mmHg on currently prescribed CCB (amlodipine) and diuretic (HCTZ) therapies.  Patient is on simvastatin for her HLD diagnosis and further ASCVD prevention.  T2DM well-controlled on currently prescribed regimen; last HgbA1c was 6.3% when checked on 01/31/2022.  Patient does have an OSAH diagnosis and is reported to be compliant with prescribed nocturnal PAP therapy.  Functional capacity limited by age and multiple medical comorbidities.  Cardiology does not deem patient able to achieve at least 4 METS of physical activity without experiencing angina/anginal  equivalent symptoms. No changes were made to her medication regimen during her visit with cardiology.  Patient scheduled  to follow-up with outpatient cardiology in 1 year or sooner if needed.  Erin Petersen is scheduled for an T10-PELVIS FUSION, T12-L1 POSTERIOR COLUMN OSTEOTOMY, POSSIBLE L1-2 POSTERIOR COLUMN OSTEOTOMY APPLICATION OF INTRAOPERATIVE CT SCAN on 04/17/2022 with Dr. Elayne Guerin, MD.  Given patient's past medical history significant for cardiovascular diagnoses, presurgical cardiac clearance was sought by the PAT team.  Additionally, surgical team requesting clearance from patient's PCP.  Clearances were obtained as follows.  Per internal/family medicine Erin Bickers, MD), "patient is optimized medically for the planned upcoming procedure.  She has HTN, OSA, and mild asthma.  Patient still needs cardiology clearance.  Patient is cleared to proceed at an overall MODERATE risk of significant complications".  Per cardiology Erin Bigness, MD), "this patient is optimized for surgery and may proceed with the planned procedural course with a LOW risk of significant perioperative cardiovascular complications".  In review of her medication reconciliation, it is noted that patient is currently on prescribed daily antiplatelet therapy. She has been instructed on recommendations for holding her daily low-dose ASA for 7 days prior to her procedure with plans to restart 14 days postoperatively.  The patient is aware that her last dose of ASA should be on 04/09/2022.  Patient reports previous perioperative complications with anesthesia in the past. She reports episodes of not being fully sedated/being awake (anesthesia awareness) during surgery. In review of the available records, it is noted that patient underwent a general anesthetic course at [facility] ([facility]) in 11/2021 without documented complications.      04/06/2022    9:06 AM 03/30/2022    1:59 PM 03/16/2022    9:36 AM   Vitals with BMI  Height 4' 10"$  4' 10"$  4' 10"$   Weight 167 lbs 5 oz 169 lbs 169 lbs 6 oz  BMI 34.98 [id number] [id number]  Systolic A999333 [id number]  Diastolic 70 80 90  Pulse 69  75    Providers/Specialists:   NOTE: Primary physician provider listed below. Patient may have been seen by APP or partner within same practice.   PROVIDER ROLE / SPECIALTY LAST Dola Factor, MD Neurosurgery (Surgeon) 03/30/2022  Abner Greenspan, MD Primary Care Provider 01/31/2022  Serafina Royals, MD Cardiology 10/07/2021   Allergies:  Bee venom, Fosamax [alendronate], Fusion plus [iron-fa-b cmp-c-biot-probiotic], Ciprofloxacin hcl, and Sulfa antibiotics  Current Home Medications:   No current facility-administered medications for this encounter.    Acetaminophen (TYLENOL PO)   albuterol (VENTOLIN HFA) 108 (90 Base) MCG/ACT inhaler   amitriptyline (ELAVIL) 25 MG tablet   amLODipine (NORVASC) 5 MG tablet   Ascorbic Acid (VITAMIN C) 1000 MG tablet   aspirin 81 MG EC tablet   calcitonin, salmon, (MIACALCIN/FORTICAL) 200 UNIT/ACT nasal spray   calcium carbonate (TUMS EX) 750 MG chewable tablet   Cholecalciferol (VITAMIN D3) 50 MCG (2000 UT) TABS   Cyanocobalamin (VITAMIN B-12) 1000 MCG SUBL   EPINEPHrine 0.3 mg/0.3 mL IJ SOAJ injection   fluticasone (FLONASE) 50 MCG/ACT nasal spray   hydrochlorothiazide (HYDRODIURIL) 25 MG tablet   IBUPROFEN PO   Melatonin 10 MG CAPS   meloxicam (MOBIC) 15 MG tablet   methocarbamol (ROBAXIN) 500 MG tablet   omeprazole (PRILOSEC) 40 MG capsule   ondansetron (ZOFRAN-ODT) 4 MG disintegrating tablet   OVER THE COUNTER MEDICATION   polyethylene glycol (MIRALAX / GLYCOLAX) 17 g packet   pregabalin (LYRICA) 300 MG capsule   rOPINIRole (REQUIP) 1 MG tablet   rOPINIRole (REQUIP) 4 MG tablet   simvastatin (ZOCOR)  20 MG tablet   History:   Past Medical History:  Diagnosis Date   Anemia    Arthritis    Asthma    Bacteremia due to Gram-negative bacteria 06/04/2019    CAD (coronary artery disease) 07/21/2016   a.) LHC 07/21/2016: mild nonobstructive CAD   Colon polyps    Complication of anesthesia    a.) anesthesia awareness   Compression fracture of L1 lumbar vertebra (HCC)    COPD (chronic obstructive pulmonary disease) (HCC)    Diastolic dysfunction    a.) TTE 03/02/2021: EF >55%, mild LVH, mild LAE, triv MR, mild TR/PR, G1DD   Duodenal ulcer    Dyspnea    GERD (gastroesophageal reflux disease)    History of 2019 novel coronavirus disease (COVID-19) 01/19/2021   a.) tested (+) at [facility]   History of lumbar fusion    a.) T12-S1   Hyperlipidemia    Hypertension    Insomnia    a.) takes melatonin PRN   OSA on CPAP    Osteopenia    Peripheral neuropathy    Plantar fasciitis    Pneumonia    Pulmonary hypertension ([place])    a.) TTE 06/04/2019: moderate (PASP 40.3 mmHg)   Right inguinal hernia    a.) s/p repair 12/2016   RLS (restless legs syndrome)    a.) on ropinirole   SOB (shortness of breath)    T12 compression fracture (HCC)    T2DM (type 2 diabetes mellitus) ([place])    Umbilical hernia    a.) s/p repair 12/2016   Urinary incontinence    Urinary tract infection    Wears hearing aid in right ear    Past Surgical History:  Procedure Laterality Date   [place]   BILATERAL SALPINGOOPHORECTOMY  01/07/2018   BIOPSY  04/15/2019   Procedure: BIOPSY;  Surgeon: [name], MD;  Location: [place];  Service: Endoscopy;;   CARDIAC CATHETERIZATION Left 07/21/2016   COLONOSCOPY WITH PROPOFOL N/A 11/11/2018   Procedure: COLONOSCOPY WITH PROPOFOL;  Surgeon: [name], MD;  Location: [facility] ENDOSCOPY;  Service: Gastroenterology;  Laterality: N/A;   CYSTO WITH HYDRODISTENSION N/A 12/30/2018   Procedure: CYSTOSCOPY/HYDRODISTENSION WITH MARCAINE;  Surgeon: Bjorn Loser, MD;  Location: [facility] ORS;  Service: Urology;  Laterality: N/A;   ESOPHAGOGASTRODUODENOSCOPY (EGD) WITH  PROPOFOL N/A 04/15/2019   Procedure: ESOPHAGOGASTRODUODENOSCOPY (EGD) WITH PROPOFOL;  Surgeon: [name], MD;  Location: [place];  Service: Endoscopy;  Laterality: N/A;  sleep apnea   ESOPHAGOGASTRODUODENOSCOPY (EGD) WITH PROPOFOL N/A 07/09/2019   Procedure: ESOPHAGOGASTRODUODENOSCOPY (EGD) WITH PROPOFOL;  Surgeon: [name], MD;  Location: [place];  Service: Gastroenterology;  Laterality: N/A;   EYE SURGERY Bilateral    INGUINAL HERNIA REPAIR Bilateral 01/05/2017   JOINT REPLACEMENT     LAPAROSCOPIC BILATERAL SALPINGO OOPHERECTOMY  2019   LUMBAR FUSION  2002   LUMBAR FUSION  11/14/2021   Dr Rennis Harding   LUMBAR FUSION  12/14/2021   Dr Ivan Croft   SPINAL FUSION  2002   TONSILLECTOMY AND ADENOIDECTOMY  1961   TOTAL KNEE ARTHROPLASTY Right 01/01/2020   Procedure: Right total knee arthroplasty - [name] to Assist;  Surgeon: Hessie Knows, MD;  Location: [facility] ORS;  Service: Orthopedics;  Laterality: Right;   TOTAL KNEE ARTHROPLASTY Left 10/12/2020   Procedure: TOTAL KNEE ARTHROPLASTY;  Surgeon: Hessie Knows, MD;  Location: [facility] ORS;  Service: Orthopedics;  Laterality: Left;   TUBAL LIGATION  UMBILICAL HERNIA REPAIR N/A 01/05/2017   Family History  Problem Relation Age of Onset   Alcohol abuse Mother    Cancer Mother    Stroke Mother    Alcohol abuse Father    Arthritis Sister    Arthritis Daughter    COPD Daughter    Arthritis Paternal Grandmother    Asthma Paternal Grandmother    COPD Paternal Grandmother    Breast cancer Paternal Aunt    Social History   Tobacco Use   Smoking status: Never   Smokeless tobacco: Never  Vaping Use   Vaping Use: Never used  Substance Use Topics   Alcohol use: Not Currently   Drug use: Never    Pertinent Clinical Results:  LABS:   No visits with results within 3 Day(s) from this visit.  Latest known visit with results is:  Hospital Outpatient Visit on 04/06/2022  Component Date Value  Ref Range Status   WBC 04/06/2022 6.2  4.0 - 10.5 K/uL Final   RBC 04/06/2022 4.77  3.87 - 5.11 MIL/uL Final   Hemoglobin 04/06/2022 10.9 (L)  12.0 - 15.0 g/dL Final   HCT 04/06/2022 35.1 (L)  36.0 - 46.0 % Final   MCV 04/06/2022 73.6 (L)  80.0 - 100.0 fL Final   MCH 04/06/2022 22.9 (L)  26.0 - 34.0 pg Final   MCHC 04/06/2022 31.1  30.0 - 36.0 g/dL Final   RDW 04/06/2022 17.4 (H)  11.5 - 15.5 % Final   Platelets 04/06/2022 433 (H)  150 - 400 K/uL Final   nRBC 04/06/2022 0.0  0.0 - 0.2 % Final   Performed at [facility], [address] [name], [place] [address]   Sodium 04/06/2022 132 (L)  135 - 145 mmol/L Final   Potassium 04/06/2022 3.3 (L)  3.5 - 5.1 mmol/L Final   Chloride 04/06/2022 92 (L)  98 - 111 mmol/L Final   CO2 04/06/2022 30  22 - 32 mmol/L Final   Glucose, Bld 04/06/2022 103 (H)  70 - 99 mg/dL Final   Glucose reference range applies only to samples taken after fasting for at least 8 hours.   BUN 04/06/2022 32 (H)  8 - 23 mg/dL Final   Creatinine, Ser 04/06/2022 0.70  0.44 - 1.00 mg/dL Final   Calcium 04/06/2022 10.1  8.9 - 10.3 mg/dL Final   GFR, Estimated 04/06/2022 >60  >60 mL/min Final   Comment: (NOTE) Calculated using the CKD-EPI Creatinine Equation (2021)    Anion gap 04/06/2022 10  5 - 15 Final   Performed at [facility], [address] [place], [place] [address]   MRSA, PCR 04/06/2022 NEGATIVE  NEGATIVE Final   Staphylococcus aureus 04/06/2022 NEGATIVE  NEGATIVE Final   Comment: (NOTE) The Xpert SA Assay (FDA approved for NASAL specimens in patients 70 years of age and older), is one component of a comprehensive surveillance program. It is not intended to diagnose infection nor to guide or monitor treatment. Performed at [facility], [place], [place] [address]    Color, Urine 04/06/2022 YELLOW (A)  YELLOW Final   APPearance 04/06/2022 CLEAR (A)  CLEAR Final   Specific Gravity, Urine 04/06/2022 1.024  1.005  - 1.030 Final   pH 04/06/2022 5.0  5.0 - 8.0 Final   Glucose, UA 04/06/2022 NEGATIVE  NEGATIVE mg/dL Final   Hgb urine dipstick 04/06/2022 NEGATIVE  NEGATIVE Final   Bilirubin Urine 04/06/2022 NEGATIVE  NEGATIVE Final   Ketones, ur 04/06/2022 NEGATIVE  NEGATIVE mg/dL Final   Protein, ur 04/06/2022 NEGATIVE  NEGATIVE mg/dL Final   Nitrite 04/06/2022 NEGATIVE  NEGATIVE Final   Leukocytes,Ua 04/06/2022 NEGATIVE  NEGATIVE Final   Performed at [facility], ., [place], [place] [address]   ABO/RH(D) 04/06/2022 O POS   Final   Antibody Screen 04/06/2022 NEG   Final   Sample Expiration 04/06/2022 04/20/2022,2359   Final   Extend sample reason 04/06/2022    Final                   Value:NO TRANSFUSIONS OR PREGNANCY IN THE PAST 3 MONTHS Performed at [facility], [address] [place],  [address]     ECG: Date: 04/06/2022 Time ECG obtained: 1016 AM Rate: 65 bpm Rhythm: normal sinus Axis (leads I and aVF): Normal Intervals: PR 192 ms. QRS 82 ms. QTc 422 ms. ST segment and T wave changes: No evidence of acute ST segment elevation or depression Comparison: Similar to previous tracing obtained on 10/21/2020   IMAGING / PROCEDURES: CT THORACIC SPINE WO CONTRAST performed on 03/28/2022 Patient status post T12-S1 fusion. Since plain films 12/15/2021, the patient has suffered a superior endplate compression fracture of T12 with vertebral body height loss of approximately 50%. There is extensive lucency about both pedicle screws in T12 consistent with loosening. The screws extend into the T11-12 interspace. Remote superior endplate compression fracture of L1. There is mild central canal stenosis at T12-L1 due to bony retropulsion.  DG SCOLIOSIS EVAL COMPLETE SPINE 2 OR 3 VIEWS performed on 03/17/2022 Previous T12 through S1 level fusion. Hardware appears intact, and stable from last month including T12 pedicle screws extending through the superior endplate  in the setting of previous T12 compression fracture. Chronic L1 compression fracture also. No acute osseous abnormality identified. No significant scoliosis.  MYOCARDIAL PERFUSION IMAGING STUDY (LEXISCAN) performed on 09/16/2021 Normal left ventricular systolic function with a hyperdynamic LVEF of 68%  Small in size, mild in severity, defect in the inferoapical region which is likely consistent with attenuation, however cannot rule out mild ischemia Low risk study  TRANSTHORACIC ECHOCARDIOGRAM performed on 03/02/2021 Normal left ventricular systolic function with mild LVH; LVEF >55% Left ventricular diastolic Doppler parameters consistent with abnormal relaxation (G1DD). Mild left atrial enlargement Trivial MR  Mild TR and PR Normal gradients; no valvular stenosis No pericardial effusion  Impression and Plan:  Erin Petersen has been referred for pre-anesthesia review and clearance prior to her undergoing the planned anesthetic and procedural courses. Available labs, pertinent testing, and imaging results were personally reviewed by me in preparation for upcoming operative/procedural course. [facility] medical record has been updated following extensive record review and patient interview with PAT staff.   This patient has been appropriately cleared by internal/family medicine (MODERATE) and by cardiology (LOW) with the individually indicated risk of significant perioperative complications. Based on clinical review performed today (04/13/22), barring any significant acute changes in the patient's overall condition, it is anticipated that she will be able to proceed with the planned surgical intervention. Any acute changes in clinical condition may necessitate her procedure being postponed and/or cancelled. Patient will meet with anesthesia team (MD and/or CRNA) on the day of her procedure for preoperative evaluation/assessment. Questions regarding anesthetic course will be fielded at that  time.   Pre-surgical instructions were reviewed with the patient during her PAT appointment, and questions were fielded to satisfaction by PAT clinical staff. She has been instructed on which medications that she will  need to hold prior to surgery, as well as the ones that have been deemed safe/appropriate to take of the day of her procedure. As part of the general education provided by PAT, patient made aware both verbally and in writing, that she would need to abstain from the use of any illegal substances during her perioperative course.  She was advised that failure to follow the provided instructions could necessitate case cancellation or result serious perioperative complications up to and including death. Patient encouraged to contact PAT and/or her surgeon's office to discuss any questions or concerns that may arise prior to surgery; verbalized understanding.   Honor Loh, MSN, APRN, FNP-C, CEN [facility]  Peri-operative Services Nurse Practitioner Phone: [phone] Fax: [phone] 04/13/22 4:04 PM  NOTE: This note has been prepared using Dragon dictation software. Despite my best ability to proofread, there is always the potential that unintentional transcriptional errors may still occur from this process.

## 2022-04-13 ENCOUNTER — Encounter: Payer: Self-pay | Admitting: Neurosurgery

## 2022-04-16 MED ORDER — CEFAZOLIN IN SODIUM CHLORIDE 2-0.9 GM/100ML-% IV SOLN
2.0000 g | Freq: Once | INTRAVENOUS | Status: DC
  Filled 2022-04-16: qty 100

## 2022-04-16 MED ORDER — CHLORHEXIDINE GLUCONATE 0.12 % MT SOLN: 15.0000 mL | Freq: Once | OROMUCOSAL | Status: AC

## 2022-04-16 MED ORDER — ORAL CARE MOUTH RINSE: 15.0000 mL | Freq: Once | OROMUCOSAL | Status: AC

## 2022-04-16 MED ORDER — LACTATED RINGERS IV SOLN: INTRAVENOUS | Status: DC

## 2022-04-16 MED ORDER — VANCOMYCIN HCL IN DEXTROSE 1-5 GM/200ML-% IV SOLN: 1000.0000 mg | Freq: Once | INTRAVENOUS | Status: AC

## 2022-04-16 MED ORDER — CEFAZOLIN SODIUM-DEXTROSE 2-4 GM/100ML-% IV SOLN
2.0000 g | INTRAVENOUS | Status: AC
  Administered 2022-04-17: 2 g via INTRAVENOUS

## 2022-04-17 ENCOUNTER — Other Ambulatory Visit: Payer: Self-pay

## 2022-04-17 ENCOUNTER — Inpatient Hospital Stay: Payer: Medicare Other

## 2022-04-17 ENCOUNTER — Inpatient Hospital Stay: Payer: Medicare Other | Admitting: Urgent Care

## 2022-04-17 ENCOUNTER — Encounter
Admission: RE | Disposition: A | Discharge status: Home Health | Payer: Self-pay | Source: Home / Self Care | Attending: Neurosurgery

## 2022-04-17 ENCOUNTER — Inpatient Hospital Stay
Admission: RE | Admit: 2022-04-17 | Discharge: 2022-04-24 | DRG: 457 | Disposition: A | Discharge status: Home Health | Payer: Medicare Other | Attending: Neurosurgery | Admitting: Neurosurgery

## 2022-04-17 ENCOUNTER — Encounter: Payer: Self-pay | Admitting: Neurosurgery

## 2022-04-17 DIAGNOSIS — M532X4 Spinal instabilities, thoracic region: Secondary | ICD-10-CM

## 2022-04-17 DIAGNOSIS — Z882 Allergy status to sulfonamides status: Secondary | ICD-10-CM

## 2022-04-17 DIAGNOSIS — K5900 Constipation, unspecified: Secondary | ICD-10-CM | POA: Diagnosis not present

## 2022-04-17 DIAGNOSIS — M4014 Other secondary kyphosis, thoracic region: Secondary | ICD-10-CM

## 2022-04-17 DIAGNOSIS — M4154 Other secondary scoliosis, thoracic region: Secondary | ICD-10-CM | POA: Diagnosis present

## 2022-04-17 DIAGNOSIS — Z888 Allergy status to other drugs, medicaments and biological substances status: Secondary | ICD-10-CM

## 2022-04-17 DIAGNOSIS — K219 Gastro-esophageal reflux disease without esophagitis: Secondary | ICD-10-CM | POA: Diagnosis not present

## 2022-04-17 DIAGNOSIS — Z803 Family history of malignant neoplasm of breast: Secondary | ICD-10-CM | POA: Diagnosis not present

## 2022-04-17 DIAGNOSIS — Z01818 Encounter for other preprocedural examination: Secondary | ICD-10-CM

## 2022-04-17 DIAGNOSIS — R200 Anesthesia of skin: Secondary | ICD-10-CM | POA: Diagnosis not present

## 2022-04-17 DIAGNOSIS — M1611 Unilateral primary osteoarthritis, right hip: Secondary | ICD-10-CM | POA: Diagnosis not present

## 2022-04-17 DIAGNOSIS — Z8719 Personal history of other diseases of the digestive system: Secondary | ICD-10-CM | POA: Diagnosis not present

## 2022-04-17 DIAGNOSIS — Z8711 Personal history of peptic ulcer disease: Secondary | ICD-10-CM

## 2022-04-17 DIAGNOSIS — Z79899 Other long term (current) drug therapy: Secondary | ICD-10-CM | POA: Diagnosis not present

## 2022-04-17 DIAGNOSIS — Z881 Allergy status to other antibiotic agents status: Secondary | ICD-10-CM

## 2022-04-17 DIAGNOSIS — M4854XS Collapsed vertebra, not elsewhere classified, thoracic region, sequela of fracture: Secondary | ICD-10-CM | POA: Diagnosis present

## 2022-04-17 DIAGNOSIS — Z8261 Family history of arthritis: Secondary | ICD-10-CM | POA: Diagnosis not present

## 2022-04-17 DIAGNOSIS — Z8616 Personal history of COVID-19: Secondary | ICD-10-CM

## 2022-04-17 DIAGNOSIS — M960 Pseudarthrosis after fusion or arthrodesis: Principal | ICD-10-CM

## 2022-04-17 DIAGNOSIS — I10 Essential (primary) hypertension: Secondary | ICD-10-CM | POA: Diagnosis not present

## 2022-04-17 DIAGNOSIS — G2581 Restless legs syndrome: Secondary | ICD-10-CM | POA: Diagnosis not present

## 2022-04-17 DIAGNOSIS — S22080A Wedge compression fracture of T11-T12 vertebra, initial encounter for closed fracture: Secondary | ICD-10-CM | POA: Diagnosis not present

## 2022-04-17 DIAGNOSIS — E785 Hyperlipidemia, unspecified: Secondary | ICD-10-CM | POA: Diagnosis not present

## 2022-04-17 DIAGNOSIS — I2510 Atherosclerotic heart disease of native coronary artery without angina pectoris: Secondary | ICD-10-CM | POA: Diagnosis present

## 2022-04-17 DIAGNOSIS — D62 Acute posthemorrhagic anemia: Secondary | ICD-10-CM | POA: Diagnosis not present

## 2022-04-17 DIAGNOSIS — Z96653 Presence of artificial knee joint, bilateral: Secondary | ICD-10-CM | POA: Diagnosis not present

## 2022-04-17 DIAGNOSIS — R3915 Urgency of urination: Secondary | ICD-10-CM | POA: Diagnosis not present

## 2022-04-17 DIAGNOSIS — Z825 Family history of asthma and other chronic lower respiratory diseases: Secondary | ICD-10-CM | POA: Diagnosis not present

## 2022-04-17 DIAGNOSIS — S22080D Wedge compression fracture of T11-T12 vertebra, subsequent encounter for fracture with routine healing: Secondary | ICD-10-CM

## 2022-04-17 DIAGNOSIS — Z7982 Long term (current) use of aspirin: Secondary | ICD-10-CM

## 2022-04-17 DIAGNOSIS — M4326 Fusion of spine, lumbar region: Secondary | ICD-10-CM | POA: Diagnosis not present

## 2022-04-17 DIAGNOSIS — Z981 Arthrodesis status: Secondary | ICD-10-CM | POA: Diagnosis not present

## 2022-04-17 DIAGNOSIS — Z823 Family history of stroke: Secondary | ICD-10-CM | POA: Diagnosis not present

## 2022-04-17 DIAGNOSIS — S22080S Wedge compression fracture of T11-T12 vertebra, sequela: Secondary | ICD-10-CM

## 2022-04-17 DIAGNOSIS — R1031 Right lower quadrant pain: Secondary | ICD-10-CM | POA: Diagnosis not present

## 2022-04-17 DIAGNOSIS — J4489 Other specified chronic obstructive pulmonary disease: Secondary | ICD-10-CM | POA: Diagnosis present

## 2022-04-17 DIAGNOSIS — Z90710 Acquired absence of both cervix and uterus: Secondary | ICD-10-CM

## 2022-04-17 HISTORY — DX: Obstructive sleep apnea (adult) (pediatric): G47.33

## 2022-04-17 HISTORY — DX: Wedge compression fracture of T11-T12 vertebra, initial encounter for closed fracture: S22.080A

## 2022-04-17 HISTORY — DX: Arthrodesis status: Z98.1

## 2022-04-17 HISTORY — PX: APPLICATION OF INTRAOPERATIVE CT SCAN: SHX6668

## 2022-04-17 HISTORY — DX: Other ill-defined heart diseases: I51.89

## 2022-04-17 HISTORY — DX: Polyneuropathy, unspecified: G62.9

## 2022-04-17 HISTORY — DX: Umbilical hernia without obstruction or gangrene: K42.9

## 2022-04-17 HISTORY — DX: Type 2 diabetes mellitus without complications: E11.9

## 2022-04-17 HISTORY — DX: Pulmonary hypertension, unspecified: I27.20

## 2022-04-17 HISTORY — DX: Wedge compression fracture of first lumbar vertebra, initial encounter for closed fracture: S32.010A

## 2022-04-17 HISTORY — DX: Insomnia, unspecified: G47.00

## 2022-04-17 HISTORY — DX: Unilateral inguinal hernia, without obstruction or gangrene, not specified as recurrent: K40.90

## 2022-04-17 HISTORY — DX: Chronic obstructive pulmonary disease, unspecified: J44.9

## 2022-04-17 LAB — CBC
HCT: 30.2 % — ABNORMAL LOW (ref 36.0–46.0)
HCT: 26.4 % — ABNORMAL LOW (ref 36.0–46.0)
Hemoglobin: 9.5 g/dL — ABNORMAL LOW (ref 12.0–15.0)
Hemoglobin: 8.3 g/dL — ABNORMAL LOW (ref 12.0–15.0)
MCH: 22.8 pg — ABNORMAL LOW (ref 26.0–34.0)
MCH: 23 pg — ABNORMAL LOW (ref 26.0–34.0)
MCHC: 31.5 g/dL (ref 30.0–36.0)
MCHC: 31.4 g/dL (ref 30.0–36.0)
MCV: 72.6 fL — ABNORMAL LOW (ref 80.0–100.0)
MCV: 73.1 fL — ABNORMAL LOW (ref 80.0–100.0)
Platelets: 532 10*3/uL — ABNORMAL HIGH (ref 150–400)
Platelets: 454 10*3/uL — ABNORMAL HIGH (ref 150–400)
RBC: 4.16 MIL/uL (ref 3.87–5.11)
RBC: 3.61 MIL/uL — ABNORMAL LOW (ref 3.87–5.11)
RDW: 17.4 % — ABNORMAL HIGH (ref 11.5–15.5)
RDW: 17.6 % — ABNORMAL HIGH (ref 11.5–15.5)
WBC: 20.6 10*3/uL — ABNORMAL HIGH (ref 4.0–10.5)
WBC: 17.8 10*3/uL — ABNORMAL HIGH (ref 4.0–10.5)
nRBC: 0 % (ref 0.0–0.2)
nRBC: 0 % (ref 0.0–0.2)

## 2022-04-17 SURGERY — POSTERIOR THORACIC FUSION 4 LEVELS
Anesthesia: General | Site: Back

## 2022-04-17 MED ORDER — ROCURONIUM BROMIDE 100 MG/10ML IV SOLN
INTRAVENOUS | Status: DC | PRN
  Administered 2022-04-17: 20 mg via INTRAVENOUS
  Administered 2022-04-17: 50 mg via INTRAVENOUS

## 2022-04-17 MED ORDER — AMLODIPINE BESYLATE 5 MG PO TABS
5.0000 mg | ORAL_TABLET | Freq: Every day | ORAL | Status: DC
  Administered 2022-04-19 – 2022-04-24 (×6): 5 mg via ORAL
  Filled 2022-04-17 (×7): qty 1

## 2022-04-17 MED ORDER — SODIUM CHLORIDE (PF) 0.9 % IJ SOLN
INTRAMUSCULAR | Status: DC | PRN
  Administered 2022-04-17: 60 mL

## 2022-04-17 MED ORDER — ONDANSETRON HCL 4 MG/2ML IJ SOLN
INTRAMUSCULAR | Status: DC | PRN
  Administered 2022-04-17: 4 mg via INTRAVENOUS

## 2022-04-17 MED ORDER — EPHEDRINE 5 MG/ML INJ
INTRAVENOUS | Status: AC
  Filled 2022-04-17: qty 5

## 2022-04-17 MED ORDER — MENTHOL 3 MG MT LOZG: 1.0000 | LOZENGE | OROMUCOSAL | Status: DC | PRN

## 2022-04-17 MED ORDER — IRRISEPT - 450ML BOTTLE WITH 0.05% CHG IN STERILE WATER, USP 99.95% OPTIME
TOPICAL | Status: DC | PRN
  Administered 2022-04-17: 450 mL

## 2022-04-17 MED ORDER — PREGABALIN 75 MG PO CAPS
150.0000 mg | ORAL_CAPSULE | Freq: Every day | ORAL | Status: DC
  Administered 2022-04-18 – 2022-04-21 (×4): 150 mg via ORAL
  Filled 2022-04-17 (×4): qty 2

## 2022-04-17 MED ORDER — FENTANYL CITRATE (PF) 100 MCG/2ML IJ SOLN
INTRAMUSCULAR | Status: AC
  Administered 2022-04-17: 25 ug via INTRAVENOUS
  Filled 2022-04-17: qty 2

## 2022-04-17 MED ORDER — DEXAMETHASONE SODIUM PHOSPHATE 10 MG/ML IJ SOLN
INTRAMUSCULAR | Status: DC | PRN
  Administered 2022-04-17: 10 mg via INTRAVENOUS

## 2022-04-17 MED ORDER — PHENYLEPHRINE 80 MCG/ML (10ML) SYRINGE FOR IV PUSH (FOR BLOOD PRESSURE SUPPORT)
PREFILLED_SYRINGE | INTRAVENOUS | Status: AC
  Filled 2022-04-17: qty 10

## 2022-04-17 MED ORDER — FENTANYL CITRATE (PF) 100 MCG/2ML IJ SOLN
INTRAMUSCULAR | Status: DC | PRN
  Administered 2022-04-17: 50 ug via INTRAVENOUS
  Administered 2022-04-17: 25 ug via INTRAVENOUS
  Administered 2022-04-17: 50 ug via INTRAVENOUS
  Administered 2022-04-17: 25 ug via INTRAVENOUS
  Administered 2022-04-17: 50 ug via INTRAVENOUS

## 2022-04-17 MED ORDER — EPINEPHRINE PF 1 MG/ML IJ SOLN
INTRAMUSCULAR | Status: AC
  Filled 2022-04-17: qty 1

## 2022-04-17 MED ORDER — SIMVASTATIN 20 MG PO TABS
20.0000 mg | ORAL_TABLET | Freq: Every day | ORAL | Status: DC
  Administered 2022-04-17 – 2022-04-23 (×7): 20 mg via ORAL
  Filled 2022-04-17 (×7): qty 1

## 2022-04-17 MED ORDER — KETOROLAC TROMETHAMINE 15 MG/ML IJ SOLN
7.5000 mg | Freq: Four times a day (QID) | INTRAMUSCULAR | Status: AC
  Administered 2022-04-18 (×4): 7.5 mg via INTRAVENOUS
  Filled 2022-04-17 (×4): qty 1

## 2022-04-17 MED ORDER — NALOXONE HCL 0.4 MG/ML IJ SOLN: 0.4000 mg | INTRAMUSCULAR | Status: DC | PRN

## 2022-04-17 MED ORDER — STERILE WATER FOR IRRIGATION IR SOLN
Status: DC | PRN
  Administered 2022-04-17: 2000 mL

## 2022-04-17 MED ORDER — SODIUM CHLORIDE 0.9% FLUSH: 9.0000 mL | INTRAVENOUS | Status: DC | PRN

## 2022-04-17 MED ORDER — HYDROMORPHONE HCL 1 MG/ML IJ SOLN
INTRAMUSCULAR | Status: DC | PRN
  Administered 2022-04-17 (×2): .5 mg via INTRAVENOUS

## 2022-04-17 MED ORDER — BUPIVACAINE LIPOSOME 1.3 % IJ SUSP
INTRAMUSCULAR | Status: AC
  Filled 2022-04-17: qty 20

## 2022-04-17 MED ORDER — AMITRIPTYLINE HCL 25 MG PO TABS
25.0000 mg | ORAL_TABLET | Freq: Every day | ORAL | Status: DC
  Administered 2022-04-17 – 2022-04-23 (×7): 25 mg via ORAL
  Filled 2022-04-17 (×7): qty 1

## 2022-04-17 MED ORDER — CHLORHEXIDINE GLUCONATE 0.12 % MT SOLN
OROMUCOSAL | Status: AC
  Administered 2022-04-17: 15 mL via OROMUCOSAL
  Filled 2022-04-17: qty 15

## 2022-04-17 MED ORDER — ACETAMINOPHEN 650 MG RE SUPP: 650.0000 mg | RECTAL | Status: DC | PRN

## 2022-04-17 MED ORDER — HYDROCHLOROTHIAZIDE 25 MG PO TABS
25.0000 mg | ORAL_TABLET | Freq: Every day | ORAL | Status: DC
  Administered 2022-04-18 – 2022-04-24 (×7): 25 mg via ORAL
  Filled 2022-04-17 (×8): qty 1

## 2022-04-17 MED ORDER — SEVOFLURANE IN SOLN
RESPIRATORY_TRACT | Status: AC
  Filled 2022-04-17: qty 250

## 2022-04-17 MED ORDER — ROPINIROLE HCL 1 MG PO TABS
1.0000 mg | ORAL_TABLET | Freq: Two times a day (BID) | ORAL | Status: DC
  Administered 2022-04-18 – 2022-04-24 (×13): 1 mg via ORAL
  Filled 2022-04-17 (×13): qty 1

## 2022-04-17 MED ORDER — ACETAMINOPHEN 325 MG PO TABS
650.0000 mg | ORAL_TABLET | ORAL | Status: DC | PRN
  Administered 2022-04-18: 650 mg via ORAL
  Filled 2022-04-17: qty 2

## 2022-04-17 MED ORDER — SODIUM CHLORIDE FLUSH 0.9 % IV SOLN
INTRAVENOUS | Status: AC
  Filled 2022-04-17: qty 20

## 2022-04-17 MED ORDER — LIDOCAINE HCL (PF) 2 % IJ SOLN
INTRAMUSCULAR | Status: AC
  Filled 2022-04-17: qty 5

## 2022-04-17 MED ORDER — SUGAMMADEX SODIUM 200 MG/2ML IV SOLN
INTRAVENOUS | Status: DC | PRN
  Administered 2022-04-17: 200 mg via INTRAVENOUS

## 2022-04-17 MED ORDER — PHENYLEPHRINE HCL-NACL 20-0.9 MG/250ML-% IV SOLN
INTRAVENOUS | Status: AC
  Filled 2022-04-17: qty 250

## 2022-04-17 MED ORDER — PHENOL 1.4 % MT LIQD: 1.0000 | OROMUCOSAL | Status: DC | PRN

## 2022-04-17 MED ORDER — CEFAZOLIN SODIUM-DEXTROSE 2-4 GM/100ML-% IV SOLN
INTRAVENOUS | Status: AC
  Filled 2022-04-17: qty 100

## 2022-04-17 MED ORDER — ALBUTEROL SULFATE (2.5 MG/3ML) 0.083% IN NEBU: 3.0000 mL | INHALATION_SOLUTION | Freq: Four times a day (QID) | RESPIRATORY_TRACT | Status: DC | PRN

## 2022-04-17 MED ORDER — EPHEDRINE SULFATE (PRESSORS) 50 MG/ML IJ SOLN
INTRAMUSCULAR | Status: DC | PRN
  Administered 2022-04-17: 5 mg via INTRAVENOUS

## 2022-04-17 MED ORDER — ACETAMINOPHEN 10 MG/ML IV SOLN
INTRAVENOUS | Status: DC | PRN
  Administered 2022-04-17: 1000 mg via INTRAVENOUS

## 2022-04-17 MED ORDER — DIPHENHYDRAMINE HCL 12.5 MG/5ML PO ELIX
12.5000 mg | ORAL_SOLUTION | Freq: Four times a day (QID) | ORAL | Status: DC | PRN
  Administered 2022-04-17: 12.5 mg via ORAL
  Filled 2022-04-17: qty 5

## 2022-04-17 MED ORDER — HYDROMORPHONE HCL 1 MG/ML IJ SOLN
INTRAMUSCULAR | Status: AC
  Administered 2022-04-17: 0.5 mg via INTRAVENOUS
  Filled 2022-04-17: qty 1

## 2022-04-17 MED ORDER — LACTATED RINGERS IV SOLN: INTRAVENOUS | Status: DC | PRN

## 2022-04-17 MED ORDER — FENTANYL CITRATE (PF) 100 MCG/2ML IJ SOLN
INTRAMUSCULAR | Status: AC
  Filled 2022-04-17: qty 2

## 2022-04-17 MED ORDER — SODIUM CHLORIDE 0.9% FLUSH
3.0000 mL | Freq: Two times a day (BID) | INTRAVENOUS | Status: DC
  Administered 2022-04-17 – 2022-04-24 (×14): 3 mL via INTRAVENOUS

## 2022-04-17 MED ORDER — METHOCARBAMOL 1000 MG/10ML IJ SOLN
500.0000 mg | Freq: Four times a day (QID) | INTRAVENOUS | Status: DC | PRN
  Administered 2022-04-17: 500 mg via INTRAVENOUS
  Filled 2022-04-17: qty 500

## 2022-04-17 MED ORDER — HYDROMORPHONE HCL 1 MG/ML IJ SOLN
INTRAMUSCULAR | Status: AC
  Filled 2022-04-17: qty 1

## 2022-04-17 MED ORDER — SODIUM CHLORIDE 0.9 % IV SOLN: 250.0000 mL | INTRAVENOUS | Status: DC

## 2022-04-17 MED ORDER — 0.9 % SODIUM CHLORIDE (POUR BTL) OPTIME
TOPICAL | Status: DC | PRN
  Administered 2022-04-17: 1000 mL

## 2022-04-17 MED ORDER — DIPHENHYDRAMINE HCL 50 MG/ML IJ SOLN: 12.5000 mg | Freq: Four times a day (QID) | INTRAMUSCULAR | Status: DC | PRN

## 2022-04-17 MED ORDER — ACETAMINOPHEN 10 MG/ML IV SOLN
INTRAVENOUS | Status: AC
  Filled 2022-04-17: qty 100

## 2022-04-17 MED ORDER — FLUTICASONE PROPIONATE 50 MCG/ACT NA SUSP
2.0000 | Freq: Every day | NASAL | Status: DC
  Administered 2022-04-17 – 2022-04-23 (×6): 2 via NASAL
  Filled 2022-04-17: qty 16

## 2022-04-17 MED ORDER — ONDANSETRON HCL 4 MG/2ML IJ SOLN
4.0000 mg | Freq: Four times a day (QID) | INTRAMUSCULAR | Status: DC | PRN
  Administered 2022-04-20: 4 mg via INTRAVENOUS
  Filled 2022-04-17: qty 2

## 2022-04-17 MED ORDER — BUPIVACAINE HCL (PF) 0.5 % IJ SOLN
INTRAMUSCULAR | Status: AC
  Filled 2022-04-17: qty 30

## 2022-04-17 MED ORDER — ROCURONIUM BROMIDE 10 MG/ML (PF) SYRINGE
PREFILLED_SYRINGE | INTRAVENOUS | Status: AC
  Filled 2022-04-17: qty 10

## 2022-04-17 MED ORDER — OXYCODONE HCL 5 MG/5ML PO SOLN: 5.0000 mg | Freq: Once | ORAL | Status: DC | PRN

## 2022-04-17 MED ORDER — ONDANSETRON HCL 4 MG PO TABS
4.0000 mg | ORAL_TABLET | Freq: Four times a day (QID) | ORAL | Status: DC | PRN
  Administered 2022-04-23: 4 mg via ORAL
  Filled 2022-04-17: qty 1

## 2022-04-17 MED ORDER — SODIUM CHLORIDE 0.9% FLUSH: 3.0000 mL | INTRAVENOUS | Status: DC | PRN

## 2022-04-17 MED ORDER — SODIUM CHLORIDE 0.9 % IV SOLN: INTRAVENOUS | Status: DC

## 2022-04-17 MED ORDER — FENTANYL CITRATE (PF) 100 MCG/2ML IJ SOLN
25.0000 ug | INTRAMUSCULAR | Status: AC | PRN
  Administered 2022-04-17 (×4): 25 ug via INTRAVENOUS

## 2022-04-17 MED ORDER — ONDANSETRON HCL 4 MG/2ML IJ SOLN
INTRAMUSCULAR | Status: AC
  Filled 2022-04-17: qty 2

## 2022-04-17 MED ORDER — HYDROMORPHONE HCL 1 MG/ML IJ SOLN
0.5000 mg | INTRAMUSCULAR | Status: DC | PRN
  Administered 2022-04-17: 0.5 mg via INTRAVENOUS

## 2022-04-17 MED ORDER — LIDOCAINE HCL (CARDIAC) PF 100 MG/5ML IV SOSY
PREFILLED_SYRINGE | INTRAVENOUS | Status: DC | PRN
  Administered 2022-04-17: 80 mg via INTRAVENOUS

## 2022-04-17 MED ORDER — PREGABALIN 75 MG PO CAPS
300.0000 mg | ORAL_CAPSULE | Freq: Every day | ORAL | Status: DC
  Administered 2022-04-17 – 2022-04-23 (×7): 300 mg via ORAL
  Filled 2022-04-17 (×7): qty 4

## 2022-04-17 MED ORDER — PROPOFOL 10 MG/ML IV BOLUS
INTRAVENOUS | Status: AC
  Filled 2022-04-17: qty 40

## 2022-04-17 MED ORDER — PROPOFOL 10 MG/ML IV BOLUS
INTRAVENOUS | Status: DC | PRN
  Administered 2022-04-17: 140 mg via INTRAVENOUS

## 2022-04-17 MED ORDER — BUPIVACAINE-EPINEPHRINE (PF) 0.5% -1:200000 IJ SOLN
INTRAMUSCULAR | Status: DC | PRN
  Administered 2022-04-17: 7 mL via PERINEURAL

## 2022-04-17 MED ORDER — VANCOMYCIN HCL 1000 MG IV SOLR
INTRAVENOUS | Status: AC
  Filled 2022-04-17: qty 40

## 2022-04-17 MED ORDER — HYDROMORPHONE 1 MG/ML IV SOLN
INTRAVENOUS | Status: DC
  Administered 2022-04-17: 30 mg via INTRAVENOUS
  Filled 2022-04-17: qty 30

## 2022-04-17 MED ORDER — VANCOMYCIN HCL IN DEXTROSE 1-5 GM/200ML-% IV SOLN
INTRAVENOUS | Status: AC
  Administered 2022-04-17: 1000 mg via INTRAVENOUS
  Filled 2022-04-17: qty 200

## 2022-04-17 MED ORDER — VANCOMYCIN HCL 1000 MG IV SOLR
INTRAVENOUS | Status: DC | PRN
  Administered 2022-04-17: 2 g via TOPICAL

## 2022-04-17 MED ORDER — PANTOPRAZOLE SODIUM 40 MG PO TBEC
40.0000 mg | DELAYED_RELEASE_TABLET | Freq: Every day | ORAL | Status: DC
  Administered 2022-04-18 – 2022-04-24 (×7): 40 mg via ORAL
  Filled 2022-04-17 (×7): qty 1

## 2022-04-17 MED ORDER — ROPINIROLE HCL 1 MG PO TABS
4.0000 mg | ORAL_TABLET | Freq: Every day | ORAL | Status: DC
  Administered 2022-04-17 – 2022-04-23 (×7): 4 mg via ORAL
  Filled 2022-04-17 (×7): qty 4

## 2022-04-17 MED ORDER — METHOCARBAMOL 500 MG PO TABS
500.0000 mg | ORAL_TABLET | Freq: Four times a day (QID) | ORAL | Status: DC | PRN
  Administered 2022-04-18 – 2022-04-19 (×3): 500 mg via ORAL
  Filled 2022-04-17 (×3): qty 1

## 2022-04-17 MED ORDER — SENNA 8.6 MG PO TABS
1.0000 | ORAL_TABLET | Freq: Two times a day (BID) | ORAL | Status: DC
  Administered 2022-04-17 – 2022-04-24 (×14): 8.6 mg via ORAL
  Filled 2022-04-17 (×14): qty 1

## 2022-04-17 MED ORDER — ENOXAPARIN SODIUM 30 MG/0.3ML IJ SOSY
30.0000 mg | PREFILLED_SYRINGE | INTRAMUSCULAR | Status: DC
  Administered 2022-04-18 – 2022-04-24 (×7): 30 mg via SUBCUTANEOUS
  Filled 2022-04-17 (×7): qty 0.3

## 2022-04-17 MED ORDER — SURGIFLO WITH THROMBIN (HEMOSTATIC MATRIX KIT) OPTIME
TOPICAL | Status: DC | PRN
  Administered 2022-04-17: 3 via TOPICAL

## 2022-04-17 MED ORDER — OXYCODONE HCL 5 MG PO TABS: 5.0000 mg | ORAL_TABLET | Freq: Once | ORAL | Status: DC | PRN

## 2022-04-17 MED ORDER — ONDANSETRON HCL 4 MG/2ML IJ SOLN: 4.0000 mg | Freq: Four times a day (QID) | INTRAMUSCULAR | Status: DC | PRN

## 2022-04-17 MED ORDER — PHENYLEPHRINE HCL-NACL 20-0.9 MG/250ML-% IV SOLN
INTRAVENOUS | Status: DC | PRN
  Administered 2022-04-17: 40 ug/min via INTRAVENOUS

## 2022-04-17 MED ORDER — PHENYLEPHRINE HCL (PRESSORS) 10 MG/ML IV SOLN
INTRAVENOUS | Status: DC | PRN
  Administered 2022-04-17: 100 ug via INTRAVENOUS
  Administered 2022-04-17 (×3): 80 ug via INTRAVENOUS

## 2022-04-17 SURGICAL SUPPLY — 83 items
ALLOGRAFT BONESTRP KORE 2.5X10 (Bone Implant) IMPLANT
BASIN KIT SINGLE STR (MISCELLANEOUS) ×2 IMPLANT
BUR NEURO DRILL SOFT 3.0X3.8M (BURR) ×2 IMPLANT
CEMENT MAX VISCOSITY NUVA (Cement) IMPLANT
CHLORAPREP W/TINT 26 (MISCELLANEOUS) IMPLANT
CNTNR URN SCR LID CUP LEK RST (MISCELLANEOUS) ×2 IMPLANT
CONNECTOR RELINE ROTATE 5-6MM (Connector) IMPLANT
CONT SPEC 4OZ STRL OR WHT (MISCELLANEOUS) ×6
CORD BIP STRL DISP 12FT (MISCELLANEOUS) IMPLANT
COUNTER NEEDLE 20/40 LG (NEEDLE) IMPLANT
COVERAGE SUPP BRAINLAB NG SPNE (MISCELLANEOUS) IMPLANT
COVERAGE SUPPORT SPINE BRAINLB (MISCELLANEOUS)
CUP MEDICINE 2OZ PLAST GRAD ST (MISCELLANEOUS) ×2 IMPLANT
DERMABOND ADVANCED .7 DNX12 (GAUZE/BANDAGES/DRESSINGS) ×2 IMPLANT
DRAPE 3D C-ARM OEC (DRAPES) IMPLANT
DRAPE C ARM PK CFD 31 SPINE (DRAPES) ×2 IMPLANT
DRAPE C-ARMOR (DRAPES) IMPLANT
DRAPE INCISE IOBAN 66X45 STRL (DRAPES) IMPLANT
DRAPE LAPAROTOMY 100X77 ABD (DRAPES) ×2 IMPLANT
DRAPE SCAN PATIENT (DRAPES) ×2 IMPLANT
DRESSING PREVENA PLUS CUSTOM (GAUZE/BANDAGES/DRESSINGS) IMPLANT
DRSG PREVENA PLUS CUSTOM (GAUZE/BANDAGES/DRESSINGS) ×2
ELECT CAUTERY BLADE TIP 2.5 (TIP) ×2
ELECT EZSTD 165MM 6.5IN (MISCELLANEOUS)
ELECT REM PT RETURN 9FT ADLT (ELECTROSURGICAL) ×2
ELECTRODE CAUTERY BLDE TIP 2.5 (TIP) ×4 IMPLANT
ELECTRODE EZSTD 165MM 6.5IN (MISCELLANEOUS) IMPLANT
ELECTRODE REM PT RTRN 9FT ADLT (ELECTROSURGICAL) ×2 IMPLANT
EX-PIN ORTHOLOCK NAV 4X150 (PIN) IMPLANT
FEE CVG SUPP BRAINLAB NG SPNE (MISCELLANEOUS) IMPLANT
GAUZE 4X4 16PLY ~~LOC~~+RFID DBL (SPONGE) IMPLANT
GLOVE BIOGEL PI IND STRL 8.5 (GLOVE) ×2 IMPLANT
GLOVE SURG SYN 6.5 ES PF (GLOVE) ×12 IMPLANT
GLOVE SURG SYN 6.5 PF PI (GLOVE) ×4 IMPLANT
GLOVE SURG SYN 8.5  E (GLOVE) ×12
GLOVE SURG SYN 8.5 E (GLOVE) ×12 IMPLANT
GLOVE SURG SYN 8.5 PF PI (GLOVE) ×6 IMPLANT
GLOVE SURG UNDER POLY LF SZ6.5 (GLOVE) ×2 IMPLANT
GOWN SRG LRG LVL 4 IMPRV REINF (GOWNS) ×2 IMPLANT
GOWN SRG XL LVL 3 NONREINFORCE (GOWNS) ×2 IMPLANT
GOWN STRL NON-REIN TWL XL LVL3 (GOWNS) ×6
GOWN STRL REIN LRG LVL4 (GOWNS) ×2
GRAFT DURAGEN MATRIX 1WX1L (Tissue) IMPLANT
HEMOVAC 400CC 10FR (MISCELLANEOUS) IMPLANT
HOLDER FOLEY CATH W/STRAP (MISCELLANEOUS) ×2 IMPLANT
JET LAVAGE IRRISEPT WOUND (IRRIGATION / IRRIGATOR) ×2
KIT INFUSE LRG II (Orthopedic Implant) IMPLANT
KIT SPINAL PRONEVIEW (KITS) ×2 IMPLANT
LAVAGE JET IRRISEPT WOUND (IRRIGATION / IRRIGATOR) IMPLANT
MANIFOLD NEPTUNE II (INSTRUMENTS) ×2 IMPLANT
MARKER SKIN DUAL TIP RULER LAB (MISCELLANEOUS) ×2 IMPLANT
MARKER SPHERE PSV REFLC 13MM (MARKER) ×14 IMPLANT
MAS fenestrated needle IMPLANT
NDL SAFETY ECLIP 18X1.5 (MISCELLANEOUS) ×2 IMPLANT
NS IRRIG 1000ML POUR BTL (IV SOLUTION) ×2 IMPLANT
PACK LAMINECTOMY NEURO (CUSTOM PROCEDURE TRAY) ×2 IMPLANT
PAD ARMBOARD 7.5X6 YLW CONV (MISCELLANEOUS) ×2 IMPLANT
RELINE MAS NDL FENS (NEEDLE) IMPLANT
RELINE MAS NEEDLE FENS (NEEDLE) ×2 IMPLANT
ROD RELINE-O 5.5X120MM LORD (Rod) IMPLANT
ROD SPINE HEX-ENDED 5.5X450 (Rod) IMPLANT
SCREW LOCK RELINE 5.5 TULIP (Screw) IMPLANT
SCREW RELINE PA 6.5X45 (Screw) IMPLANT
SCREW RELINE-O POLY 6.5X45 (Screw) IMPLANT
SCREW RELINE-O POLY 7.5X40 (Screw) IMPLANT
SCREW RELINE-O POLY 7.5X45 (Screw) IMPLANT
SCREW SPINAL 8.5X80 RELINE (Screw) IMPLANT
SPONGE T-LAP 18X18 ~~LOC~~+RFID (SPONGE) IMPLANT
STAPLER SKIN PROX 35W (STAPLE) IMPLANT
SURGIFLO W/THROMBIN 8M KIT (HEMOSTASIS) ×2 IMPLANT
SUT DVC VLOC 3-0 CL 6 P-12 (SUTURE) ×2 IMPLANT
SUT ETHILON 3-0 FS-10 30 BLK (SUTURE) ×4
SUT VIC AB 0 CT1 27 (SUTURE) ×8
SUT VIC AB 0 CT1 27XCR 8 STRN (SUTURE) ×4 IMPLANT
SUT VIC AB 2-0 CT1 18 (SUTURE) ×4 IMPLANT
SUTURE EHLN 3-0 FS-10 30 BLK (SUTURE) IMPLANT
SYR 10ML LL (SYRINGE) ×2 IMPLANT
SYR 30ML LL (SYRINGE) ×4 IMPLANT
TIP FENS REPLACE RELINE (MISCELLANEOUS) IMPLANT
TOWEL OR 17X26 4PK STRL BLUE (TOWEL DISPOSABLE) ×4 IMPLANT
TRAP FLUID SMOKE EVACUATOR (MISCELLANEOUS) ×2 IMPLANT
TROCAR INSERT W/PEDICLE NDL (TROCAR) IMPLANT
WATER STERILE IRR 1000ML POUR (IV SOLUTION) ×4 IMPLANT

## 2022-04-17 NOTE — Transfer of Care (Signed)
Immediate Anesthesia Transfer of Care Note  Patient: Erin Petersen  Procedure(s) Performed: T10-PELVIS FUSION, T12-L1 POSTERIOR COLUMN OSTEOTOMY, POSSIBLE L1-2 POSTERIOR COLUMN OSTEOTOMY (Back) APPLICATION OF INTRAOPERATIVE CT SCAN  Patient Location: PACU  Anesthesia Type:General  Level of Consciousness: awake, alert , and oriented  Airway & Oxygen Therapy: Patient Spontanous Breathing and Patient connected to nasal cannula oxygen  Post-op Assessment: Report given to RN and Post -op Vital signs reviewed and stable  Post vital signs: Reviewed and stable  Last Vitals:  Vitals Value Taken Time  BP    Temp    Pulse    Resp    SpO2      Last Pain:  Vitals:   04/17/22 0904  TempSrc: Temporal  PainSc: 5          Complications: No notable events documented.

## 2022-04-17 NOTE — OR Nursing (Signed)
Explanted the following from patient's back (T12 -S1):  set screws x 8,connectors x 2, rod x 4,cross connect x1,washer x 6, and screws x12

## 2022-04-17 NOTE — Interval H&P Note (Signed)
History and Physical Interval Note:  04/17/2022 10:02 AM  Erin Petersen  has presented today for surgery, with the diagnosis of M96.0 Pseudoarthrosis of thoracic spine after fusion procedure M40.14 Other secondary kyphosis, thoracic region S22.080S Compression fracture of T12 vertebra, sequela M53.2X4 Spinal instability, thoracic.  The various methods of treatment have been discussed with the patient and family. After consideration of risks, benefits and other options for treatment, the patient has consented to  Procedure(s): T10-PELVIS FUSION, T12-L1 POSTERIOR COLUMN OSTEOTOMY, POSSIBLE L1-2 POSTERIOR COLUMN OSTEOTOMY (N/A) APPLICATION OF INTRAOPERATIVE CT SCAN (N/A) as a surgical intervention.  The patient's history has been reviewed, patient examined, no change in status, stable for surgery.  I have reviewed the patient's chart and labs.  Questions were answered to the patient's satisfaction.    Heart sounds normal no MRG. Chest Clear to Auscultation Bilaterally.   Carnell Casamento

## 2022-04-17 NOTE — Anesthesia Preprocedure Evaluation (Signed)
Anesthesia Evaluation  Patient identified by MRN, date of birth, ID band Patient awake    Reviewed: Allergy & Precautions, H&P , NPO status , Patient's Chart, lab work & pertinent test results  History of Anesthesia Complications (+) AWARENESS UNDER ANESTHESIA and history of anesthetic complications  Airway Mallampati: III  TM Distance: <3 FB Neck ROM: limited    Dental  (+) Chipped, Dental Advidsory Given   Pulmonary shortness of breath and with exertion, asthma , sleep apnea and Continuous Positive Airway Pressure Ventilation , neg recent URI   Pulmonary exam normal breath sounds clear to auscultation       Cardiovascular Exercise Tolerance: Good hypertension, pulmonary hypertension(-) angina (-) Past MI and (-) Cardiac Stents Normal cardiovascular exam+ dysrhythmias (long QT) (-) Valvular Problems/Murmurs Rhythm:Regular Rate:Normal  TTE 2021 1. Left ventricular ejection fraction, by estimation, is 55 to 60%. The  left ventricle has normal function. The left ventricle has no regional  wall motion abnormalities. Left ventricular diastolic parameters were  normal.  2. Right ventricular systolic function is normal. The right ventricular  size is normal. There is moderately elevated pulmonary artery systolic  pressure.  3. Left atrial size was mildly dilated.  4. The mitral valve is normal in structure. No evidence of mitral valve  regurgitation. No evidence of mitral stenosis.  5. The aortic valve is normal in structure. Aortic valve regurgitation is  not visualized. No aortic stenosis is present.  6. The inferior vena cava is dilated in size with >50% respiratory  variability, suggesting right atrial pressure of 8 mmHg.    Neuro/Psych neg Seizures  Neuromuscular disease  negative psych ROS   GI/Hepatic Neg liver ROS, PUD,GERD  Medicated and Controlled,,  Endo/Other  negative endocrine ROSdiabetes    Renal/GU negative  Renal ROS  negative genitourinary   Musculoskeletal  (+) Arthritis ,    Abdominal   Peds  Hematology  (+) Blood dyscrasia, anemia   Anesthesia Other Findings Past Medical History: No date: Arthritis No date: Asthma     Comment:  exercise induced asthma No date: Colon polyps No date: Dyspnea No date: GERD (gastroesophageal reflux disease) No date: Hyperlipidemia No date: Hypertension No date: Neuropathy     Comment:  right leg and foot No date: Plantar fasciitis No date: Prediabetes No date: RLS (restless legs syndrome) No date: Sleep apnea     Comment:  CPAP No date: SOB (shortness of breath) No date: Urinary incontinence No date: Urinary tract infection No date: Wears hearing aid in right ear  Past Surgical History: 1972: ABDOMINAL HYSTERECTOMY 1958: APPENDECTOMY No date: BACK SURGERY     Comment:  lumbar fusion 01/07/2018: BILATERAL SALPINGOOPHORECTOMY 04/15/2019: BIOPSY     Comment:  Procedure: BIOPSY;  Surgeon: [name], MD;                Location: [place];  Service: Endoscopy;; No date: CARDIAC CATHETERIZATION     Comment:  over 10 yrs ago.  [place], [place] 11/11/2018: COLONOSCOPY WITH PROPOFOL; N/A     Comment:  Procedure: COLONOSCOPY WITH PROPOFOL;  Surgeon: [name], MD;  Location: [facility];  Service:               Gastroenterology;  Laterality: N/A; 12/30/2018: CYSTO WITH HYDRODISTENSION; N/A     Comment:  Procedure: CYSTOSCOPY/HYDRODISTENSION WITH MARCAINE;  Surgeon: Bjorn Loser, MD;  Location: [facility];                Service: Urology;  Laterality: N/A; 04/15/2019: ESOPHAGOGASTRODUODENOSCOPY (EGD) WITH PROPOFOL; N/A     Comment:  Procedure: ESOPHAGOGASTRODUODENOSCOPY (EGD) WITH               PROPOFOL;  Surgeon: [name], MD;  Location:               [place];  Service: Endoscopy;  Laterality:               N/A;  sleep apnea No date: HERNIA  REPAIR     Comment:  abd No date: HERNIA REPAIR     Comment:  umbilical No date: INGUINAL HERNIA REPAIR; Bilateral     Comment:  inguinal No date: LAPAROSCOPIC BILATERAL SALPINGO OOPHERECTOMY 2002: SPINAL FUSION 1961: TONSILLECTOMY AND ADENOIDECTOMY  BMI    Body Mass Index: 31.71 kg/m      Reproductive/Obstetrics negative OB ROS                             Anesthesia Physical Anesthesia Plan  ASA: 3  Anesthesia Plan: General ETT   Post-op Pain Management:    Induction: Intravenous  PONV Risk Score and Plan: 4 or greater and Ondansetron, Dexamethasone, Midazolam, Propofol infusion and TIVA  Airway Management Planned: Oral ETT  Additional Equipment: None  Intra-op Plan:   Post-operative Plan: Extubation in OR  Informed Consent: I have reviewed the patients History and Physical, chart, labs and discussed the procedure including the risks, benefits and alternatives for the proposed anesthesia with the patient or authorized representative who has indicated his/her understanding and acceptance.     Dental Advisory Given  Plan Discussed with: Anesthesiologist, CRNA and Surgeon  Anesthesia Plan Comments: (Patient consented for risks of anesthesia including but not limited to:  - adverse reactions to medications - damage to eyes, teeth, lips or other oral mucosa - nerve damage due to positioning  - sore throat or hoarseness - Damage to heart, brain, nerves, lungs, other parts of body or loss of life  Patient voiced understanding.)        Anesthesia Quick Evaluation

## 2022-04-17 NOTE — Anesthesia Procedure Notes (Signed)
Procedure Name: Intubation Date/Time: 04/17/2022 10:48 AM  Performed by: Chanetta Marshall, CRNAPre-anesthesia Checklist: Patient identified, Emergency Drugs available, Suction available and Patient being monitored Patient Re-evaluated:Patient Re-evaluated prior to induction Oxygen Delivery Method: Circle system utilized Preoxygenation: Pre-oxygenation with 100% oxygen Induction Type: IV induction Ventilation: Mask ventilation without difficulty Laryngoscope Size: McGraph and 3 Grade View: Grade I Tube type: Oral Tube size: 6.5 mm Number of attempts: 1 Airway Equipment and Method: Video-laryngoscopy Placement Confirmation: ETT inserted through vocal cords under direct vision, positive ETCO2, breath sounds checked- equal and bilateral and CO2 detector Secured at: 20 cm Tube secured with: Tape Dental Injury: Teeth and Oropharynx as per pre-operative assessment

## 2022-04-17 NOTE — Op Note (Signed)
Indications: Ms. 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   Preoperative Note:   Risks of surgery discussed include: infection, bleeding, stroke, coma, death, paralysis, CSF leak, nerve/spinal cord injury, numbness, tingling, weakness, complex regional pain syndrome, recurrent stenosis and/or disc herniation, vascular injury, development of instability, neck/back pain, need for further surgery, persistent symptoms, development of deformity, and the risks of anesthesia. The patient understood these risks and agreed to proceed.  Operative Note:  1. Posterior Column Osteotomy T12/L1 2. Posterolateral arthrodesis T10 to L4 3. Posterior segmental instrumentation T10 to pelvis using Nuvasive Reline 4. Harvesting of autograft via the same incision 5. Use of stereotaxis    The patient was brought to the Operating Room, intubated and turned into the prone position. All pressure points were checked and double checked. Flouroscopy was used to mark the incision. The patient was prepped and draped in the standard fashion. A full timeout was  performed. Preoperative antibiotics were given. The incision was injected with local anesthetic.  The incision was opened with a scalpel, then the soft tissues divided with the Bovie. Self-retaining retractors were placed. The paraspinus muscles were reflected laterally in subperiosteal fashion until the transverse processes were visible. The prior implants were fully exposed, which allowed successful confirmation of level.   All of the prior implants were removed.  Obvious pseudoarthrosis was noted above her prior L4-S1 fusion.  The patient was not fused from T12-L4. The sizes of the pedical screws were noted.  After reviewing all sizes, we chose to upsize all pedicle screws and lengthen to allow better purchase.    We then placed the stereotactic array at T10.  The stereotactic C arm was used to acquire stereotactic images which were then registered to the [place] system for the lumbar spine.  The stereotactic guide was used to allow successful cannulation of the ilium on the right and the sacrum through the sacroiliac joint to the ilium on the left.  This was palpated on both sides.  8.5 x 80 mm screw was then placed on the left and on the right.  On the left, S2 sacroiliac screw was placed.  On the right, an iliac screw was placed.  We then reinstrumented L3-S1 leaving out the right L5 screw, which had broken the right L5 pedicle.  We then checked the lumbar pedicle screws using the C arm, and then on a second spin acquired stereotactic images for T10-L2.  This was registered to the patient using the [place] system.  Using the stereotactic drill guide, T10 and T11 were cannulated bilaterally.  The L1 and L2 tracts were recannulated.  The right L1 path was chosen to instrument just inferior to the previously healed compression fracture at this level.  6.5 x 45 mm screws were then placed  bilaterally at T10, T11, L1, and L2.  Fenestrated screws were used at T10 and T11.  At this point, the  stereotactic C-arm was used to check placement of pedicle screws.  There was no breach noted, and the fenestrations were noted to be well within the trabecular bone at T10 and T11.  The cement was mixed and 1 mL of cement was placed at T10 and T11 bilaterally via the NuVasive system.  At T12/L1, the drill was used to remove the inferior articulating process of T12 bilaterally.  The superior articulating process of L1 was removed bilaterally.  The ligamentum flavum was removed in entirety. The leading edge of L1 was removed.  At this point, the entire facet joint had been removed bilaterally, and a chevron-shaped closing osteotomy had been made in the posterior column.  Thus, a single column osteotomy was completed at T12/L1.   Rods were measured to length, cut, and shaped. The rods were secured using locking caps to manufacturer's specifications. The wound was copiously irrigated, then the external surfaces of the remaining lamina, facet, and transverse processes from T10 to L4 were decorticated. A mixture of BMP, allograft and autograft was placed over the decorticated surfaces for arthrodesis.   A drain was placed subfascially and suprafascially.   After hemostasis, the wound was closed in layers with 0 and 2-0 vicryl. 3-0 monocryl and dermabond  was applied to the incision.  The patient was then flipped supine and extubated with incident. All counts were correct times 2 at the end of the case. No immediate complications were noted.  Geronimo Boot PA assisted in the entire procedure. An assistant was required for this procedure due to the complexity.  The assistant provided assistance in tissue manipulation and suction, and was required for the successful and safe performance of the procedure. I performed the critical portions of the procedure.   Meade Maw MD

## 2022-04-17 NOTE — Anesthesia Postprocedure Evaluation (Signed)
Anesthesia Post Note  Patient: Erin Petersen  Procedure(s) Performed: T10-PELVIS FUSION, T12-L1 POSTERIOR COLUMN OSTEOTOMY, POSSIBLE L1-2 POSTERIOR COLUMN OSTEOTOMY (Back) APPLICATION OF INTRAOPERATIVE CT SCAN  Patient location during evaluation: PACU Anesthesia Type: General Level of consciousness: awake and alert Pain management: pain level controlled Vital Signs Assessment: post-procedure vital signs reviewed and stable Respiratory status: spontaneous breathing, nonlabored ventilation, respiratory function stable and patient connected to nasal cannula oxygen Cardiovascular status: blood pressure returned to baseline and stable Postop Assessment: no apparent nausea or vomiting Anesthetic complications: no   No notable events documented.   Last Vitals:  Vitals:   04/17/22 1830 04/17/22 1845  BP: (!) 108/51 (!) 108/58  Pulse: 79 82  Resp: 12 11  Temp:    SpO2: 98% 100%    Last Pain:  Vitals:   04/17/22 1830  TempSrc:   PainSc: 7                  Precious Haws Marquett Bertoli

## 2022-04-18 ENCOUNTER — Encounter: Payer: Self-pay | Admitting: Neurosurgery

## 2022-04-18 LAB — CBC
HCT: 22.7 % — ABNORMAL LOW (ref 36.0–46.0)
HCT: 20.5 % — ABNORMAL LOW (ref 36.0–46.0)
Hemoglobin: 7.2 g/dL — ABNORMAL LOW (ref 12.0–15.0)
Hemoglobin: 6.4 g/dL — ABNORMAL LOW (ref 12.0–15.0)
MCH: 23.2 pg — ABNORMAL LOW (ref 26.0–34.0)
MCH: 23 pg — ABNORMAL LOW (ref 26.0–34.0)
MCHC: 31.7 g/dL (ref 30.0–36.0)
MCHC: 31.2 g/dL (ref 30.0–36.0)
MCV: 73.2 fL — ABNORMAL LOW (ref 80.0–100.0)
MCV: 73.7 fL — ABNORMAL LOW (ref 80.0–100.0)
Platelets: 395 10*3/uL (ref 150–400)
Platelets: 324 10*3/uL (ref 150–400)
RBC: 3.1 MIL/uL — ABNORMAL LOW (ref 3.87–5.11)
RBC: 2.78 MIL/uL — ABNORMAL LOW (ref 3.87–5.11)
RDW: 17.9 % — ABNORMAL HIGH (ref 11.5–15.5)
RDW: 17.8 % — ABNORMAL HIGH (ref 11.5–15.5)
WBC: 17.3 10*3/uL — ABNORMAL HIGH (ref 4.0–10.5)
WBC: 12.9 10*3/uL — ABNORMAL HIGH (ref 4.0–10.5)
nRBC: 0 % (ref 0.0–0.2)
nRBC: 0.2 % (ref 0.0–0.2)

## 2022-04-18 MED ORDER — POLYETHYLENE GLYCOL 3350 17 G PO PACK
17.0000 g | PACK | Freq: Every day | ORAL | Status: DC
  Administered 2022-04-18 – 2022-04-24 (×7): 17 g via ORAL
  Filled 2022-04-18 (×7): qty 1

## 2022-04-18 MED ORDER — OXYCODONE HCL 5 MG PO TABS
5.0000 mg | ORAL_TABLET | ORAL | Status: DC | PRN
  Administered 2022-04-18 – 2022-04-19 (×4): 5 mg via ORAL
  Filled 2022-04-18 (×5): qty 1

## 2022-04-18 MED ORDER — OXYCODONE HCL 5 MG PO TABS
10.0000 mg | ORAL_TABLET | ORAL | Status: DC | PRN
  Administered 2022-04-18 – 2022-04-20 (×8): 10 mg via ORAL
  Filled 2022-04-18 (×8): qty 2

## 2022-04-18 MED ORDER — SODIUM CHLORIDE 0.9% IV SOLUTION: Freq: Once | INTRAVENOUS | Status: AC

## 2022-04-18 MED ORDER — HYDROXYZINE HCL 10 MG PO TABS
10.0000 mg | ORAL_TABLET | Freq: Three times a day (TID) | ORAL | Status: DC | PRN
  Administered 2022-04-20 – 2022-04-24 (×2): 10 mg via ORAL
  Filled 2022-04-18 (×3): qty 1

## 2022-04-18 MED ORDER — HYDROMORPHONE HCL 1 MG/ML IJ SOLN
0.5000 mg | INTRAMUSCULAR | Status: DC | PRN
  Administered 2022-04-19 – 2022-04-20 (×3): 0.5 mg via INTRAVENOUS
  Filled 2022-04-18 (×3): qty 1

## 2022-04-18 NOTE — Progress Notes (Addendum)
OT Cancellation Note  Patient Details Name: SHARTAVIA CAINES MRN: [id number] DOB: November 12, 1940   Cancelled Treatment:    Reason Eval/Treat Not Completed: Other (comment).   9:14 - OT orders received, chart reviewed. Pt supine in bed with RN present in room removing PCA. Pt requesting to wear TLSO for OOB activity due to LBP (currently 6/10). Brace not present in room and pt requesting OT come back later today. Will re-attempt as able.  1355 - Evaluation attempted for second time today. Pt received supine in bed with PT present. TLSO brace in room and pt agreeable to attempt OOB activity. Pt endorsing pain at IV site where receiving blood transfusion. Localized edema/hematoma at insertion site noted. RN called into room to assess. RN requested to hold therapy at this time until transfusion complete. Will re-attempt evaluation at later date/time.  Lanelle Bal  [facility] 04/18/2022, 9:30 AM

## 2022-04-18 NOTE — Progress Notes (Addendum)
Progress Note  History: Erin Petersen is s/p T10- pelvis PSF  POD1: pt experiencing symptomatic anemia overnight. Reports reasonable pain control    Physical Exam: Vitals:   04/18/22 0730 04/18/22 0756  BP:  (!) 99/47  Pulse:  78  Resp: 17 16  Temp:  99.3 F (37.4 C)  SpO2: 99% 97%    AA Ox3 CNI  Strength:5/5 throughout BLE  HV: Right with 40 mL output post-op. Left 240 output post-op  Data:  Other tests/results: Hg 6.4 this morning  Assessment/Plan:  Erin Petersen is a 82 y.o s/p thoracopelvic fusion.   - mobilize - pain control;  d/c PCA today and transition to PO medication - DVT prophylaxis - Will keep HV drains for now - brace when OOB and ambulating - transfusing 2 units this morning - added Miralax per patient's request.  - PTOT; inpatient rehab consult placed  Cooper Render PA-C Department of Neurosurgery  This is an addendum to the above note for clarification.  The patient suffered from acute blood loss anemia due to surgical intervention.  This is the rationale for blood transfusion.

## 2022-04-18 NOTE — Progress Notes (Signed)
Orthopedic Tech Progress Note Patient Details:  Erin Petersen Sep 07, 1940 [id number] Called in order for TLSO brace to [employer]. Patient ID: SUTTEN JUNES, female   DOB: [birth year], 82 y.o.   MRN: [id number]  Chip Boer 04/18/2022, 10:59 AM

## 2022-04-18 NOTE — Progress Notes (Signed)
PCA discontinued. Wasted 20 mL.of Dilaudid. Witnessed by [employer], Therapist, sports.

## 2022-04-18 NOTE — Progress Notes (Signed)
PT Cancellation Note  Patient Details Name: Erin Petersen MRN: [id number] DOB: [birth year]   Cancelled Treatment:    Reason Eval/Treat Not Completed: Medical issues which prohibited therapy.    1305 - Evaluation re-attempted.  Patient requesting pain medication; administered per RN during session attempt. Patient/daughter requesting allow time for medication to take effect, re-attempting again later this PM.  1355 - Evaluation re-attempted x3.  Patient agreeable to participation as able; TLSO now delivered and available in room as well.  Participates with social history and gross UE/LE movement assessment (grossly 3-/5 throughout), then endorses acute pain to L wrist at IV insertion site.  Noted with localized edema/hematoma at insertion site (infiltration?).  RN informed/aware and at bedside to address.  RN requested hold on additional mobility attempts at this time until transfusion complete (so as not to compromise last remaining IV).  Will continue efforts again at later time/date.  Of note, patient reports living alone prior to admission; single-story home with 1 step (no rails) to enter.  Ambulatory with RW/4WRW, using TLSO for pain control/stability; denies fall history.  Active, enjoys community activities.   Leon Montoya H. Owens Shark, PT, DPT, NCS 04/18/22, 2:44 PM [phone]

## 2022-04-18 NOTE — Progress Notes (Signed)
PT Cancellation Note  Patient Details Name: Erin Petersen MRN: [id number] DOB: Jun 13, 1940   Cancelled Treatment:    Reason Eval/Treat Not Completed:  (Evaluation attempted.  Per physician, okay for mobility without brace (brace primarily for comfort); however, patient requesting to hold evaluation until brace received and donned with mobility. Therapist called brace order to [facility]; will continue to follow and initiate evaluation as brace available and patient allows)  Cyd Hostler H. Owens Shark, PT, DPT, NCS 04/18/22, 10:45 AM [phone]

## 2022-04-18 NOTE — Progress Notes (Signed)
Inpatient Rehab Admissions Coordinator:   Consult received.  Await therapy evaluations for post-op recommendations.    Shann Medal, PT, DPT Admissions Coordinator [phone] 04/18/22  10:05 AM

## 2022-04-18 NOTE — Plan of Care (Signed)
  Problem: Pain Management: Goal: Pain level will decrease Outcome: Progressing    Problem: Safety: Goal: Ability to remain free from injury will improve Outcome: Progressing   Problem: Skin Integrity: Goal: Risk for impaired skin integrity will decrease Outcome: Progressing   Problem: Elimination: Goal: Will not experience complications related to bowel motility Outcome: Progressing   Problem: Activity: Goal: Ability to avoid complications of mobility impairment will improve Outcome: Progressing

## 2022-04-19 LAB — BPAM RBC
Blood Product Expiration Date: 202403242359
Blood Product Expiration Date: 202403242359
ISSUE DATE / TIME: 202402201213
ISSUE DATE / TIME: 202402201656
Unit Type and Rh: 5100
Unit Type and Rh: 5100

## 2022-04-19 LAB — TYPE AND SCREEN
ABO/RH(D): O POS
Antibody Screen: NEGATIVE
Unit division: 0
Unit division: 0

## 2022-04-19 LAB — CBC
HCT: 27.2 % — ABNORMAL LOW (ref 36.0–46.0)
Hemoglobin: 9 g/dL — ABNORMAL LOW (ref 12.0–15.0)
MCH: 25.2 pg — ABNORMAL LOW (ref 26.0–34.0)
MCHC: 33.1 g/dL (ref 30.0–36.0)
MCV: 76.2 fL — ABNORMAL LOW (ref 80.0–100.0)
Platelets: 273 10*3/uL (ref 150–400)
RBC: 3.57 MIL/uL — ABNORMAL LOW (ref 3.87–5.11)
RDW: 17.4 % — ABNORMAL HIGH (ref 11.5–15.5)
WBC: 11.6 10*3/uL — ABNORMAL HIGH (ref 4.0–10.5)
nRBC: 0 % (ref 0.0–0.2)

## 2022-04-19 LAB — URINALYSIS, ROUTINE W REFLEX MICROSCOPIC
Bilirubin Urine: NEGATIVE
Glucose, UA: NEGATIVE mg/dL
Hgb urine dipstick: NEGATIVE
Ketones, ur: NEGATIVE mg/dL
Leukocytes,Ua: NEGATIVE
Nitrite: NEGATIVE
Protein, ur: NEGATIVE mg/dL
Specific Gravity, Urine: 1.006 (ref 1.005–1.030)
pH: 5 (ref 5.0–8.0)

## 2022-04-19 MED ORDER — METHOCARBAMOL 500 MG PO TABS
500.0000 mg | ORAL_TABLET | Freq: Four times a day (QID) | ORAL | Status: DC
  Administered 2022-04-19 – 2022-04-24 (×21): 500 mg via ORAL
  Filled 2022-04-19 (×21): qty 1

## 2022-04-19 MED ORDER — METHOCARBAMOL 1000 MG/10ML IJ SOLN
500.0000 mg | Freq: Four times a day (QID) | INTRAVENOUS | Status: DC
  Filled 2022-04-19: qty 5

## 2022-04-19 MED ORDER — ACETAMINOPHEN 325 MG PO TABS
650.0000 mg | ORAL_TABLET | Freq: Four times a day (QID) | ORAL | Status: DC
  Administered 2022-04-19 – 2022-04-24 (×21): 650 mg via ORAL
  Filled 2022-04-19 (×21): qty 2

## 2022-04-19 NOTE — Progress Notes (Signed)
Inpatient Rehab Admissions Coordinator:   OT recommending [place], and PT feels pt will do well at home as well.  Mobilizing with min guard.  Does not need CIR admit at this time.  Will sign off.   Shann Medal, PT, DPT Admissions Coordinator [phone] 04/19/22  2:26 PM

## 2022-04-19 NOTE — Evaluation (Signed)
Occupational Therapy Evaluation Patient Details Name: Erin Petersen MRN: [id number] DOB: February 26, 1941 Today's Date: 04/19/2022   History of Present Illness Erin Petersen is an 82 y/o female s/p T10- pelvis PSF on 04/17/22. PMH: Arthritis, asthma, DM, dyspnea, GERD, HTN, osteopenia, sleep apnea, UTI.   Clinical Impression   Patient presenting with decreased independence in self care, balance, functional mobility/transfers, and endurance. PTA pt lived alone and was Mod I for ADLs/IADLs. Pt reports using AE for LB dressing at baseline, however, limited ability to attempt this date 2/2 pain. Pt was pre-medicated before session. Endorsed 8/10 back pain t/o. Pt currently functioning at Max A for bed mobility using log roll technique, supervision-CGA for functional transfers/mobility using RW, and supervision for clothing management/peri care in standing. Pt will benefit from acute OT to increase overall independence in the areas of ADLs and functional mobility in order to safely discharge home. Pt could benefit from [facility] following D/C to decrease falls risk, improve balance, and maximize independence in self-care within own home environment.    Recommendations for follow up therapy are one component of a multi-disciplinary discharge planning process, led by the attending physician.  Recommendations may be updated based on patient status, additional functional criteria and insurance authorization.   Follow Up Recommendations  Home health OT     Assistance Recommended at Discharge Intermittent Supervision/Assistance  Patient can return home with the following A little help with walking and/or transfers;Assistance with cooking/housework;Assist for transportation;Help with stairs or ramp for entrance;A little help with bathing/dressing/bathroom    Functional Status Assessment  Patient has had a recent decline in their functional status and demonstrates the ability to make significant improvements in  function in a reasonable and predictable amount of time.  Equipment Recommendations  None recommended by OT    Recommendations for Other Services       Precautions / Restrictions Precautions Precautions: Back;Fall Precaution Comments: log roll, no BLT Required Braces or Orthoses: Spinal Brace Spinal Brace: Thoracolumbosacral orthotic;Applied in sitting position Restrictions Weight Bearing Restrictions: No      Mobility Bed Mobility Overal bed mobility: Needs Assistance Bed Mobility: Sidelying to Sit   Sidelying to sit: Max assist       General bed mobility comments: for log roll technique, limited by pain    Transfers Overall transfer level: Needs assistance Equipment used: Rolling walker (2 wheels) Transfers: Sit to/from Stand Sit to Stand: Supervision, Min guard                  Balance Overall balance assessment: Needs assistance Sitting-balance support: Feet supported Sitting balance-Leahy Scale: Good     Standing balance support: Single extremity supported, Bilateral upper extremity supported, During functional activity Standing balance-Leahy Scale: Fair                             ADL either performed or assessed with clinical judgement   ADL Overall ADL's : Needs assistance/impaired     Grooming: Supervision/safety;Set up;Standing;Wash/dry hands Grooming Details (indicate cue type and reason): for sinkside grooming         Upper Body Dressing : Maximal assistance;Sitting Upper Body Dressing Details (indicate cue type and reason): to apply TLSO brace Lower Body Dressing: Maximal assistance;Sitting/lateral leans Lower Body Dressing Details (indicate cue type and reason): for socks 2/2 pain, normally uses AE Toilet Transfer: BSC/3in1;Ambulation;Rolling walker (2 wheels);Min guard;Supervision/safety Toilet Transfer Details (indicate cue type and reason): BSC frame placed over regular toilet  Toileting- Clothing Manipulation and  Hygiene: Supervision/safety;Sit to/from stand Toileting - Clothing Manipulation Details (indicate cue type and reason): for peri care and clothing management in standing     Functional mobility during ADLs: Min guard;Supervision/safety;Rolling walker (2 wheels) (~25 ft, from EOB>bathroom>recliner)       Vision Baseline Vision/History: 1 Wears glasses Patient Visual Report: No change from baseline       Perception     Praxis      Pertinent Vitals/Pain Pain Assessment Pain Assessment: 0-10 Pain Score: 8  Pain Location: back (along incision site) Pain Descriptors / Indicators: Constant, Sore Pain Intervention(s): Limited activity within patient's tolerance, Monitored during session, Premedicated before session, Repositioned     Hand Dominance Right   Extremity/Trunk Assessment Upper Extremity Assessment Upper Extremity Assessment: Generalized weakness   Lower Extremity Assessment Lower Extremity Assessment: Generalized weakness   Cervical / Trunk Assessment Cervical / Trunk Assessment: Back Surgery   Communication Communication Communication: HOH (hearing aides)   Cognition Arousal/Alertness: Awake/alert Behavior During Therapy: WFL for tasks assessed/performed Overall Cognitive Status: Within Functional Limits for tasks assessed           General Comments       Exercises Other Exercises Other Exercises: OT provided education re: role of OT, OT POC, post acute recs, sitting up for all meals, EOB/OOB mobility with assistance, home/fall safety, log roll technique, no BLT, how to don/doff TLSO brace     Shoulder Instructions      Home Living Family/patient expects to be discharged to:: Private residence Living Arrangements: Alone Available Help at Discharge: Family;Available PRN/intermittently (pt reports sister can help at D/C) Type of Home: House Home Access: Stairs to enter [employer] of Steps: 1 small step Entrance Stairs-Rails: None Home  Layout: One level     Bathroom Shower/Tub: Occupational psychologist: Handicapped height     Home Equipment: Grab bars - tub/shower;Rolling Environmental consultant (2 wheels);Cane - single point;BSC/3in1;Adaptive equipment Adaptive Equipment: Reacher;Sock aid Additional Comments: has BSC frame but no bucket      Prior Functioning/Environment Prior Level of Function : Independent/Modified Independent               ADLs Comments: Mod I for ADLs (uses AE for LB dressing) and IADLs        OT Problem List: Decreased strength;Decreased range of motion;Decreased activity tolerance;Impaired balance (sitting and/or standing);Pain;Decreased knowledge of use of DME or AE;Decreased knowledge of precautions      OT Treatment/Interventions: Self-care/ADL training;Therapeutic exercise;Therapeutic activities;Energy conservation;DME and/or AE instruction;Patient/family education;Balance training    OT Goals(Current goals can be found in the care plan section) Acute Rehab OT Goals Patient Stated Goal: reduce pain, return home OT Goal Formulation: With patient Time For Goal Achievement: 05/03/22 Potential to Achieve Goals: Good   OT Frequency: Min 2X/week    Co-evaluation              AM-PAC OT "6 Clicks" Daily Activity     Outcome Measure Help from another person eating meals?: None Help from another person taking care of personal grooming?: A Little Help from another person toileting, which includes using toliet, bedpan, or urinal?: A Little Help from another person bathing (including washing, rinsing, drying)?: A Lot Help from another person to put on and taking off regular upper body clothing?: A Little Help from another person to put on and taking off regular lower body clothing?: A Lot 6 Click Score: 17   End of Session Equipment Utilized During Treatment: Rolling  walker (2 wheels);Back brace Nurse Communication: Mobility status;Precautions  Activity Tolerance: Patient tolerated  treatment well Patient left: in chair;with call bell/phone within reach;with chair alarm set;Other (comment) (handoff to PT)  OT Visit Diagnosis: Pain;Other abnormalities of gait and mobility (R26.89);Muscle weakness (generalized) (M62.81) Pain - part of body:  (back)                Time: [id number] OT Time Calculation (min): 30 min Charges:  OT General Charges $OT Visit: 1 Visit OT Evaluation $OT Eval Low Complexity: 1 Low  [facility] MS, OTR/L ascom [phone]  04/19/22, 12:43 PM

## 2022-04-19 NOTE — Progress Notes (Addendum)
Progress Note  History: Erin Petersen is s/p T10- pelvis PSF  POD2: Pt frustrated that she was not woken up overnight for pain medication and is now rating pain in her back at a 7-8/10. She admits she was given PRNs about 30-45 minutes ago. She denies any leg pain  POD1: pt experiencing symptomatic anemia overnight. Reports reasonable pain control    Physical Exam: Vitals:   04/19/22 0002 04/19/22 0419  BP: (!) 115/50 (!) 133/58  Pulse: 80 78  Resp: 18 18  Temp: 97.9 F (36.6 C) (!) 97.5 F (36.4 C)  SpO2: 97% 94%    AA Ox3 CNI  Strength:5/5 throughout BLE  HV: Right 105. Left 290  Data:  Other tests/results: Hg pending this morning   Assessment/Plan:  Erin Petersen is a 82 y.o s/p thoracopelvic fusion.   - mobilize - pain control; will schedule Tylenol and Robaxin - DVT prophylaxis - Will keep HV drains for now - brace when OOB and ambulating - added Miralax per patient's request.  - PTOT; inpatient rehab consult placed  Cooper Render PA-C Department of Neurosurgery

## 2022-04-19 NOTE — Plan of Care (Signed)

## 2022-04-19 NOTE — Evaluation (Signed)
Physical Therapy Evaluation Patient Details Name: Erin Petersen MRN: [id number] DOB: 1940/03/18 Today's Date: 04/19/2022  History of Present Illness  Erin Petersen is an 82 y/o female s/p T10- pelvis PSF on 04/17/22. PMH: Arthritis, asthma, DM, dyspnea, GERD, HTN, osteopenia, sleep apnea, UTI.  Clinical Impression  Pt was eager to work with PT and ultimately did quite well.  She did not nearly have the pain that was bothering her yesterday and reports the the TLSO is "more comfortable than the other one".  She did need light assist with LEs to get back up into bed at end of session but otherwise generally only needed CGA with plenty of cuing for spinal protection considerations and sequencing strategies.  Pt reports that she feels like she c/would benefit from STR but did well today and has sister coming to provide 24/7 assist, feeling more confident about being able to manage.       Recommendations for follow up therapy are one component of a multi-disciplinary discharge planning process, led by the attending physician.  Recommendations may be updated based on patient status, additional functional criteria and insurance authorization.  Follow Up Recommendations Follow physician's recommendations for discharge plan and follow up therapies      Assistance Recommended at Discharge Intermittent Supervision/Assistance  Patient can return home with the following  A little help with walking and/or transfers;A little help with bathing/dressing/bathroom;Assistance with cooking/housework;Assistance with feeding;Help with stairs or ramp for entrance;Assist for transportation    Equipment Recommendations None recommended by PT  Recommendations for Other Services       Functional Status Assessment Patient has had a recent decline in their functional status and demonstrates the ability to make significant improvements in function in a reasonable and predictable amount of time.     Precautions /  Restrictions Precautions Precautions: Back;Fall Precaution Comments: log roll, no BLT Required Braces or Orthoses: Spinal Brace Spinal Brace: Thoracolumbosacral orthotic;Applied in sitting position Restrictions Weight Bearing Restrictions: No      Mobility  Bed Mobility Overal bed mobility: Needs Assistance Bed Mobility: Sit to Sidelying         Sit to sidelying: Min assist General bed mobility comments: in recliner on arrivl,    Transfers Overall transfer level: Needs assistance Equipment used: Rolling walker (2 wheels) Transfers: Sit to/from Stand Sit to Stand: Min guard           General transfer comment: Minimal cuing for UE use and sequencing.  Multiple sit to standing efforts - slow and effortful but able to rise from standard height bed, recliner and BSC without direct assist    Ambulation/Gait Ambulation/Gait assistance: Min guard Gait Distance (Feet): 75 Feet Assistive device: Rolling walker (2 wheels)         General Gait Details: Pt was able to assume relatively consistent cadence and appropriate use of of walker.  She was able to tolerate a more prolonged walk into the hallway but did fatigue with the effort.  Pt with no LOBs or increased pain with the effort.  Stairs            Wheelchair Mobility    Modified Rankin (Stroke Patients Only)       Balance Overall balance assessment: Needs assistance Sitting-balance support: Feet supported Sitting balance-Leahy Scale: Good     Standing balance support: Bilateral upper extremity supported, During functional activity Standing balance-Leahy Scale: Fair  Pertinent Vitals/Pain Pain Assessment Pain Assessment: 0-10 Pain Score: 6  Pain Location: back/incision Pain Descriptors / Indicators: Constant, Sore Pain Intervention(s): Premedicated before session, Limited activity within patient's tolerance    Home Living Family/patient expects to be  discharged to:: Private residence Living Arrangements: Alone Available Help at Discharge: Family;Available PRN/intermittently (sister flying in to help) Type of Home: House Home Access: Stairs to enter Entrance Stairs-Rails: None Entrance Stairs-Number of Steps: 1 small step   Home Layout: One level Home Equipment: Grab bars - tub/shower;Cane - single point;BSC/3in1;Adaptive equipment;Rollator (4 wheels);Rolling Walker (2 wheels) Additional Comments: has BSC frame but no bucket    Prior Function Prior Level of Function : Independent/Modified Independent             Mobility Comments: has been needing RW for all mobility but able to be active ADLs Comments: Mod I for ADLs (uses AE for LB dressing) and IADLs     Hand Dominance   Dominant Hand: Right    Extremity/Trunk Assessment   Upper Extremity Assessment Upper Extremity Assessment: Generalized weakness    Lower Extremity Assessment Lower Extremity Assessment: Generalized weakness (functional and symmetical)    Cervical / Trunk Assessment Cervical / Trunk Assessment: Back Surgery  Communication   Communication: HOH (hearing aides)  Cognition Arousal/Alertness: Awake/alert Behavior During Therapy: WFL for tasks assessed/performed Overall Cognitive Status: Within Functional Limits for tasks assessed                                          General Comments General comments (skin integrity, edema, etc.): time spent educating on donning/fit of TLSO, positioning, course of recovery    Exercises     Assessment/Plan    PT Assessment Patient needs continued PT services  PT Problem List Decreased strength;Decreased balance       PT Treatment Interventions DME instruction;Gait training;Stair training;Functional mobility training;Therapeutic activities;Therapeutic exercise;Balance training;Cognitive remediation;Patient/family education    PT Goals (Current goals can be found in the Care Plan  section)  Acute Rehab PT Goals Patient Stated Goal: go home PT Goal Formulation: With patient Time For Goal Achievement: 05/03/22 Potential to Achieve Goals: Fair    Frequency 7X/week     Co-evaluation               AM-PAC PT "6 Clicks" Mobility  Outcome Measure Help needed turning from your back to your side while in a flat bed without using bedrails?: A Little Help needed moving from lying on your back to sitting on the side of a flat bed without using bedrails?: A Lot Help needed moving to and from a bed to a chair (including a wheelchair)?: A Little Help needed standing up from a chair using your arms (e.g., wheelchair or bedside chair)?: A Little Help needed to walk in hospital room?: A Little Help needed climbing 3-5 steps with a railing? : A Lot 6 Click Score: 16    End of Session Equipment Utilized During Treatment: Gait belt;Back brace Activity Tolerance: Patient tolerated treatment well Patient left: with call bell/phone within reach Nurse Communication: Mobility status PT Visit Diagnosis: Unsteadiness on feet (R26.81);Muscle weakness (generalized) (M62.81);Pain Pain - part of body:  (lumbago)    Time: 0915-1000 PT Time Calculation (min) (ACUTE ONLY): 45 min   Charges:   PT Evaluation $PT Eval Low Complexity: 1 Low PT Treatments $Gait Training: 8-22 mins $Therapeutic Activity: 8-22 mins  Kreg Shropshire, DPT 04/19/2022, 1:35 PM

## 2022-04-20 LAB — CBC
HCT: 26.6 % — ABNORMAL LOW (ref 36.0–46.0)
Hemoglobin: 8.6 g/dL — ABNORMAL LOW (ref 12.0–15.0)
MCH: 25.1 pg — ABNORMAL LOW (ref 26.0–34.0)
MCHC: 32.3 g/dL (ref 30.0–36.0)
MCV: 77.8 fL — ABNORMAL LOW (ref 80.0–100.0)
Platelets: 240 10*3/uL (ref 150–400)
RBC: 3.42 MIL/uL — ABNORMAL LOW (ref 3.87–5.11)
RDW: 17.6 % — ABNORMAL HIGH (ref 11.5–15.5)
WBC: 8 10*3/uL (ref 4.0–10.5)
nRBC: 0 % (ref 0.0–0.2)

## 2022-04-20 MED ORDER — OXYCODONE HCL 5 MG PO TABS: 15.0000 mg | ORAL_TABLET | ORAL | Status: DC | PRN

## 2022-04-20 MED ORDER — OXYCODONE HCL 5 MG PO TABS
10.0000 mg | ORAL_TABLET | ORAL | Status: DC | PRN
  Administered 2022-04-20 – 2022-04-21 (×5): 10 mg via ORAL
  Filled 2022-04-20 (×5): qty 2

## 2022-04-20 MED ORDER — KETOROLAC TROMETHAMINE 15 MG/ML IJ SOLN
15.0000 mg | Freq: Four times a day (QID) | INTRAMUSCULAR | Status: AC
  Administered 2022-04-20 – 2022-04-22 (×8): 15 mg via INTRAVENOUS
  Filled 2022-04-20 (×8): qty 1

## 2022-04-20 NOTE — TOC Initial Note (Signed)
Transition of Care [facility]) - Progression Note    Patient Details  Name: Erin Petersen MRN: [id number] Date of Birth: [birth year]  Transition of [facility]) CM/SW Contact  Laurena Slimmer, RN Phone Number: 04/20/2022, 2:49 PM  Clinical Narrative:    Message sent to [facility] from [facility] to check status.         Expected Discharge Plan and Services                                               Social Determinants of Health (SDOH) Interventions SDOH Screenings   Food Insecurity: No Food Insecurity (04/18/2022)  Housing: Low Risk  (04/18/2022)  Transportation Needs: No Transportation Needs (08/26/2021)  Utilities: At Risk (04/18/2022)  Alcohol Screen: Low Risk  (08/26/2021)  Depression (PHQ2-9): Low Risk  (08/26/2021)  Financial Resource Strain: Low Risk  (08/26/2021)  Physical Activity: Inactive (08/26/2021)  Social Connections: Moderately Integrated (08/26/2021)  Stress: No Stress Concern Present (08/26/2021)  Tobacco Use: Low Risk  (04/18/2022)    Readmission Risk Interventions     No data to display

## 2022-04-20 NOTE — Plan of Care (Signed)
  Problem: Education: Goal: Ability to verbalize activity precautions or restrictions will improve Outcome: Progressing   Problem: Pain Management: Goal: Pain level will decrease Outcome: Progressing   Problem: Pain Managment: Goal: General experience of comfort will improve Outcome: Progressing   Problem: Safety: Goal: Ability to remain free from injury will improve Outcome: Progressing   Problem: Education: Goal: Ability to verbalize activity precautions or restrictions will improve Outcome: Progressing   Problem: Pain Management: Goal: Pain level will decrease Outcome: Progressing   Problem: Pain Managment: Goal: General experience of comfort will improve Outcome: Progressing   Problem: Safety: Goal: Ability to remain free from injury will improve Outcome: Progressing

## 2022-04-20 NOTE — Progress Notes (Signed)
Progress Note  History: Erin Petersen is s/p T10- pelvis PSF  POD3: Pt experienced urinary urgency yesterday. UA negative. Pt noting right groin pain this morning and increased pain overnight despite PRNs  POD2: Pt frustrated that she was not woken up overnight for pain medication and is now rating pain in her back at a 7-8/10. She admits she was given PRNs about 30-45 minutes ago. She denies any leg pain  POD1: pt experiencing symptomatic anemia overnight. Reports reasonable pain control    Physical Exam: Vitals:   04/19/22 2335 04/20/22 0448  BP: (!) 141/65 139/60  Pulse: 75 75  Resp: 18 18  Temp: 98.3 F (36.8 C) 98.7 F (37.1 C)  SpO2: 98% 92%    AA Ox3 CNI  Strength:5/5 throughout BLE  HV: Right 10. Left 120  Data:  Other tests/results: Hg 8.4 this morning   Assessment/Plan:  Erin Petersen is a 82 y.o s/p thoracopelvic fusion.   - mobilize - pain control; will schedule Tylenol and Robaxin - DVT prophylaxis - removed right HV this morning. We will continue to monitor the left - brace when OOB and ambulating - added Miralax per patient's request.  - PTOT; inpatient rehab consult placed  Cooper Render PA-C Department of Neurosurgery

## 2022-04-20 NOTE — Discharge Instructions (Addendum)
  Your surgeon has performed an operation on your lumbar spine (low back) to relieve pressure on one or more nerves. Many times, patients feel better immediately after surgery and can "overdo it." Even if you feel well, it is important that you follow these activity guidelines. If you do not let your back heal properly from the surgery, you can increase the chance of return of your symptoms. The following are instructions to help in your recovery once you have been discharged from the hospital.  Activity    No bending, lifting, or twisting ("BLT"). Avoid lifting objects heavier than 10 pounds (gallon milk jug).  Where possible, avoid household activities that involve lifting, bending, pushing, or pulling such as laundry, vacuuming, grocery shopping, and childcare. Try to arrange for help from friends and family for these activities while your back heals.  Increase physical activity slowly as tolerated.  Taking short walks is encouraged, but avoid strenuous exercise. Do not jog, run, bicycle, lift weights, or participate in any other exercises unless specifically allowed by your doctor. Avoid prolonged sitting, including car rides.  Talk to your doctor before resuming sexual activity.  You should not drive until cleared by your doctor.  Until released by your doctor, you should not return to work or school.  You should rest at home and let your body heal.   You may shower.  After showering, lightly dab your incision dry. Do not take a tub bath or go swimming for 3 weeks, or until approved by your doctor at your follow-up appointment.  If you smoke, we strongly recommend that you quit.  Smoking has been proven to interfere with normal healing in your back and will dramatically reduce the success rate of your surgery. Please contact [facility] (800-QUIT-NOW) and use the resources at www.[facility].com for assistance in stopping smoking.  Surgical Incision   Please remove the dressing on  04/24/2022. Diet            You may return to your usual diet. Be sure to stay hydrated.  When to Contact us  Although your surgery and recovery will likely be uneventful, you may have some residual numbness, aches, and pains in your back and/or legs. This is normal and should improve in the next few weeks.  However, should you experience any of the following, contact us immediately: New numbness or weakness Pain that is progressively getting worse, and is not relieved by your pain medications or rest Bleeding, redness, swelling, pain, or drainage from surgical incision Chills or flu-like symptoms Fever greater than 101.0 F (38.3 C) Problems with bowel or bladder functions Difficulty breathing or shortness of breath Warmth, tenderness, or swelling in your calf  Contact Information During office hours (Monday-Friday 9 am to 5 pm), please call your physician at [phone] and ask for Berdine Addison After hours and weekends, please call [phone] and speak with the neurosurgeon on call For a life-threatening emergency, call 911

## 2022-04-20 NOTE — Evaluation (Signed)
Occupational Therapy Evaluation Patient Details Name: Erin Petersen MRN: [id number] DOB: 05-01-1940 Today's Date: 04/20/2022   History of Present Illness CLARETTA REMUS is an 82 y/o female s/p T10- pelvis PSF on 04/17/22. PMH: Arthritis, asthma, DM, dyspnea, GERD, HTN, osteopenia, sleep apnea, UTI.   Clinical Impression   Ms. Man was seen for OT treatment on this date. Upon arrival to room pt sidelying in bed, appears to be sleeping but rouses with VCs. She reports increased pelvis and groin pain as well as itching, but is agreeable to OT tx session. RN called during session and notified of pain/itching. OT facilitated ADL management as described below (see ADL section for additional detail). Pt requires SET UP/SUPERVISION for toilet transfer, toileting, standing UB grooming and functional mobility. MOD A to don TLSO during session. Pt educated on safety, falls prevention, and compensatory ADL management strategies for adherence to back precautions t/o session. Pt making good progress toward goals and continues to benefit from skilled OT services to maximize return to PLOF and minimize risk of future falls, injury, caregiver burden, and readmission. Will continue to follow POC. Discharge recommendation remains appropriate.      Recommendations for follow up therapy are one component of a multi-disciplinary discharge planning process, led by the attending physician.  Recommendations may be updated based on patient status, additional functional criteria and insurance authorization.   Follow Up Recommendations  Home health OT     Assistance Recommended at Discharge Intermittent Supervision/Assistance  Patient can return home with the following A little help with walking and/or transfers;Assistance with cooking/housework;Assist for transportation;Help with stairs or ramp for entrance;A little help with bathing/dressing/bathroom    Functional Status Assessment     Equipment  Recommendations  None recommended by OT    Recommendations for Other Services       Precautions / Restrictions Precautions Precautions: Back;Fall Precaution Comments: log roll, no BLT Required Braces or Orthoses: Spinal Brace Spinal Brace: Thoracolumbosacral orthotic;Applied in sitting position Restrictions Weight Bearing Restrictions: No      Mobility Bed Mobility Overal bed mobility: Needs Assistance Bed Mobility: Sit to Sidelying, Sidelying to Sit   Sidelying to sit: Min assist     Sit to sidelying: Min assist      Transfers Overall transfer level: Needs assistance Equipment used: Rolling walker (2 wheels) Transfers: Sit to/from Stand Sit to Stand: Min guard           General transfer comment: Minimal cuing for UE use and sequencing.  Multiple sit to standing efforts - slow and effortful but able to rise from standard height bed, recliner and BSC without direct assist      Balance Overall balance assessment: Needs assistance Sitting-balance support: Feet supported Sitting balance-Leahy Scale: Good     Standing balance support: Bilateral upper extremity supported, Reliant on assistive device for balance, During functional activity Standing balance-Leahy Scale: Fair Standing balance comment: Able to stand without UE support, but tends to lean onto counter or walker with fatigue.                           ADL either performed or assessed with clinical judgement   ADL Overall ADL's : Needs assistance/impaired     Grooming: Standing;Supervision/safety;Set up;Wash/dry face;Oral care Grooming Details (indicate cue type and reason): Increased pain with prolonged standing >5 min.         Upper Body Dressing : Moderate assistance;Sitting Upper Body Dressing Details (indicate cue type and  reason): MOD A to don TLSO in sitting, improved from previous sesison.     Toilet Transfer: Rolling walker (2 wheels);Regular Toilet;Set  up;Supervision/safety Toilet Transfer Details (indicate cue type and reason): BSC frame placed over regular toilet Toileting- Clothing Manipulation and Hygiene: Supervision/safety;Sit to/from stand;Adhering to back precautions;With adaptive equipment       Functional mobility during ADLs: Supervision/safety;Set up;Rolling walker (2 wheels)       Vision Baseline Vision/History: 1 Wears glasses Patient Visual Report: No change from baseline       Perception     Praxis      Pertinent Vitals/Pain Pain Assessment Pain Assessment: 0-10 Pain Score: 8  Pain Location: groin/hips Pain Descriptors / Indicators: Sore, Sharp, Shooting Pain Intervention(s): Limited activity within patient's tolerance, Monitored during session, Patient requesting pain meds-RN notified, Repositioned, Ice applied     Hand Dominance     Extremity/Trunk Assessment             Communication     Cognition Arousal/Alertness: Awake/alert Behavior During Therapy: WFL for tasks assessed/performed Overall Cognitive Status: Within Functional Limits for tasks assessed                                 General Comments: Pleasant, eager to work with therapy     General Comments       Exercises Other Exercises Other Exercises: Pt educated on role of OT in acute setting, safe use of AE/DME for ADL management, reviewed BLTA back precautions and TLSO use/wear schedule. Pt with good recall from past therapy sessions.   Shoulder Instructions      Home Living                                          Prior Functioning/Environment                          OT Problem List:        OT Treatment/Interventions:      OT Goals(Current goals can be found in the care plan section) Acute Rehab OT Goals Patient Stated Goal: to go home OT Goal Formulation: With patient Time For Goal Achievement: 05/03/22 Potential to Achieve Goals: Good  OT Frequency: Min 2X/week     Co-evaluation              AM-PAC OT "6 Clicks" Daily Activity     Outcome Measure Help from another person eating meals?: None Help from another person taking care of personal grooming?: A Little Help from another person toileting, which includes using toliet, bedpan, or urinal?: A Little Help from another person bathing (including washing, rinsing, drying)?: A Lot Help from another person to put on and taking off regular upper body clothing?: A Little Help from another person to put on and taking off regular lower body clothing?: A Lot 6 Click Score: 17   End of Session Equipment Utilized During Treatment: Rolling walker (2 wheels);Back brace Nurse Communication: Patient requests pain meds  Activity Tolerance: Patient tolerated treatment well Patient left: with call bell/phone within reach;Other (comment);in bed;with bed alarm set  OT Visit Diagnosis: Pain;Other abnormalities of gait and mobility (R26.89);Muscle weakness (generalized) (M62.81) Pain - Right/Left:  (both) Pain - part of body:  (back/pelvis)  Time: [id number] OT Time Calculation (min): 40 min Charges:  OT General Charges $OT Visit: 1 Visit OT Treatments $Self Care/Home Management : 38-52 mins  Shara Blazing, M.S., OTR/L 04/20/22, 3:30 PM

## 2022-04-20 NOTE — TOC Progression Note (Signed)
Transition of Care [facility]) - Progression Note    Patient Details  Name: MALAJAH KADRMAS MRN: [id number] Date of Birth: 1940/10/20  [facility] CM/SW Contact  Laurena Slimmer, RN Phone Number: 04/20/2022, 3:01 PM  Clinical Narrative:    Spoke with patient regarding PT rec for [facility]. She is agreeable and does not have a preference.  Referral sent and accepted by Floydene Flock from [facility].         Expected Discharge Plan and Services                                               Social Determinants of Health (SDOH) Interventions SDOH Screenings   Food Insecurity: No Food Insecurity (04/18/2022)  Housing: Low Risk  (04/18/2022)  Transportation Needs: No Transportation Needs (08/26/2021)  Utilities: At Risk (04/18/2022)  Alcohol Screen: Low Risk  (08/26/2021)  Depression (PHQ2-9): Low Risk  (08/26/2021)  Financial Resource Strain: Low Risk  (08/26/2021)  Physical Activity: Inactive (08/26/2021)  Social Connections: Moderately Integrated (08/26/2021)  Stress: No Stress Concern Present (08/26/2021)  Tobacco Use: Low Risk  (04/18/2022)    Readmission Risk Interventions     No data to display

## 2022-04-20 NOTE — Care Management Important Message (Signed)
Important Message  Patient Details  Name: Erin Petersen MRN: [id number] Date of Birth: 11-22-1940   Medicare Important Message Given:  N/A - LOS <3 / Initial given by admissions     Erin Petersen 04/20/2022, 8:59 AM

## 2022-04-20 NOTE — Progress Notes (Signed)
Physical Therapy Treatment Patient Details Name: Erin Petersen MRN: [id number] DOB: [birth year] Today's Date: 04/20/2022   History of Present Illness Erin Petersen is an 82 y/o female s/p T10- pelvis PSF on 04/17/22. PMH: Arthritis, asthma, DM, dyspnea, GERD, HTN, osteopenia, sleep apnea, UTI.    PT Comments    Pt showed great effort and motivation but continues to be pain limited, have weak LEs and decreased ambulation/standing tolerance.  Pt did better with bed mobility today needing only minA but again very slow and guarded, heavily reliant on rails.  Pt was able to do 2 bouts of guarded and slow ambulation but despite much motivation to go farther needed to sit after 30 ft and even quicker on second attempt.  Pt with c/o pain, dizziness and weakness.  BP in sitting after ambulation 117/54.  Continue PT POC to address functional limitations.    Recommendations for follow up therapy are one component of a multi-disciplinary discharge planning process, led by the attending physician.  Recommendations may be updated based on patient status, additional functional criteria and insurance authorization.  Follow Up Recommendations  Follow physician's recommendations for discharge plan and follow up therapies     Assistance Recommended at Discharge Frequent or constant Supervision/Assistance  Patient can return home with the following A little help with walking and/or transfers;A little help with bathing/dressing/bathroom;Assistance with cooking/housework;Assistance with feeding;Help with stairs or ramp for entrance;Assist for transportation   Equipment Recommendations  None recommended by PT    Recommendations for Other Services       Precautions / Restrictions Precautions Precautions: Back;Fall Spinal Brace: Thoracolumbosacral orthotic;Applied in sitting position Restrictions Weight Bearing Restrictions: No     Mobility  Bed Mobility Overal bed mobility: Needs Assistance Bed  Mobility: Sit to Sidelying, Sidelying to Sit   Sidelying to sit: Min assist     Sit to sidelying: Min assist General bed mobility comments: Pt needing a lot of cuing and encouragement as well as use of bed rails but was actually able to do most of the transitions with slow and guarded but minimally assisted effort    Transfers Overall transfer level: Needs assistance Equipment used: Rolling walker (2 wheels) Transfers: Sit to/from Stand Sit to Stand: Min guard           General transfer comment: Minimal cuing for UE use and sequencing.  Multiple sit to standing efforts - slow and effortful but able to rise from standard height bed, recliner and BSC without direct assist    Ambulation/Gait Ambulation/Gait assistance: Min assist Gait Distance (Feet): 30 Feet Assistive device: Rolling walker (2 wheels)         General Gait Details: 30 ft then 10 ft, Pt showed good effort and was able to do some limited ambulation but ultimately she was having pain, dizziness, and did that she could trust her LEs to hold her.  She did fatigue with the effort with SpO2 into the high 80s on room air, BP and HR stable and appropriate.  Pt had high hopes on being able to ambulate further today and was disappointed that she could not do more.   Stairs             Wheelchair Mobility    Modified Rankin (Stroke Patients Only)       Balance Overall balance assessment: Needs assistance Sitting-balance support: Feet supported Sitting balance-Leahy Scale: Good     Standing balance support: Bilateral upper extremity supported, Reliant on assistive device for balance Standing balance-Leahy  Scale: Fair                              Cognition Arousal/Alertness: Awake/alert Behavior During Therapy: WFL for tasks assessed/performed Overall Cognitive Status: Within Functional Limits for tasks assessed                                          Exercises General  Exercises - Lower Extremity Ankle Circles/Pumps: AROM, 10 reps Heel Slides: AROM, 10 reps Hip ABduction/ADduction: AROM, 10 reps Straight Leg Raises: AAROM, 5 reps    General Comments General comments (skin integrity, edema, etc.): Pt c/o more pain today, states drains kept her from getting good sleep last night, LEs continue to be weak and despite great effort and attitude but ultimately remains functionally limited.      Pertinent Vitals/Pain Pain Assessment Pain Assessment: 0-10 Pain Score: 8  Pain Intervention(s): Premedicated before session, Limited activity within patient's tolerance    Home Living                          Prior Function            PT Goals (current goals can now be found in the care plan section) Acute Rehab PT Goals Patient Stated Goal: go home Progress towards PT goals: Progressing toward goals    Frequency    7X/week      PT Plan Current plan remains appropriate    Co-evaluation              AM-PAC PT "6 Clicks" Mobility   Outcome Measure  Help needed turning from your back to your side while in a flat bed without using bedrails?: A Little Help needed moving from lying on your back to sitting on the side of a flat bed without using bedrails?: A Lot Help needed moving to and from a bed to a chair (including a wheelchair)?: A Little Help needed standing up from a chair using your arms (e.g., wheelchair or bedside chair)?: A Little Help needed to walk in hospital room?: A Little Help needed climbing 3-5 steps with a railing? : A Lot 6 Click Score: 16    End of Session Equipment Utilized During Treatment: Gait belt;Back brace Activity Tolerance: Patient tolerated treatment well Patient left: with call bell/phone within reach;with bed alarm set Nurse Communication: Mobility status PT Visit Diagnosis: Unsteadiness on feet (R26.81);Muscle weakness (generalized) (M62.81);Pain Pain - part of body:  (lumbago)     Time:  [id number] PT Time Calculation (min) (ACUTE ONLY): 47 min  Charges:  $Gait Training: 8-22 mins $Therapeutic Exercise: 8-22 mins $Therapeutic Activity: 8-22 mins                     Kreg Shropshire, DPT 04/20/2022, 11:27 AM

## 2022-04-21 ENCOUNTER — Inpatient Hospital Stay: Payer: Medicare Other

## 2022-04-21 LAB — PREPARE RBC (CROSSMATCH)

## 2022-04-21 MED ORDER — PREGABALIN 75 MG PO CAPS
300.0000 mg | ORAL_CAPSULE | Freq: Every day | ORAL | Status: DC
  Administered 2022-04-22 – 2022-04-24 (×3): 300 mg via ORAL
  Filled 2022-04-21 (×3): qty 4

## 2022-04-21 MED ORDER — OXYCODONE HCL 5 MG PO TABS: 15.0000 mg | ORAL_TABLET | ORAL | Status: DC | PRN

## 2022-04-21 MED ORDER — OXYCODONE HCL 5 MG PO TABS
10.0000 mg | ORAL_TABLET | ORAL | Status: DC | PRN
  Administered 2022-04-21 – 2022-04-23 (×12): 10 mg via ORAL
  Administered 2022-04-23: 15 mg via ORAL
  Administered 2022-04-24 (×2): 10 mg via ORAL
  Filled 2022-04-21 (×6): qty 2
  Filled 2022-04-21: qty 3
  Filled 2022-04-21 (×8): qty 2

## 2022-04-21 MED ORDER — TRAMADOL HCL 50 MG PO TABS: 50.0000 mg | ORAL_TABLET | ORAL | Status: DC | PRN

## 2022-04-21 NOTE — Progress Notes (Addendum)
Progress Note  History: Erin Petersen is s/p T10- pelvis PSF  POD4: continued groin pain worse on the right than the left  POD3: Pt experienced urinary urgency yesterday. UA negative. Pt noting right groin pain this morning and increased pain overnight despite PRNs  POD2: Pt frustrated that she was not woken up overnight for pain medication and is now rating pain in her back at a 7-8/10. She admits she was given PRNs about 30-45 minutes ago. She denies any leg pain  POD1: pt experiencing symptomatic anemia overnight. Reports reasonable pain control    Physical Exam: Vitals:   04/21/22 0524 04/21/22 0758  BP: (!) 121/53 (!) 107/53  Pulse: 64 66  Resp:  17  Temp:  97.8 F (36.6 C)  SpO2:  95%    AA Ox3 CNI  Strength:5/5 throughout BLE  HV: 110 out yesterday  Assessment/Plan:  Erin Petersen is a 82 y.o s/p thoracopelvic fusion.   - mobilize - pain control; scheduled Tylenol and Robaxin.  Increase Lyrica - will obtain hip xray  - DVT prophylaxis -To monitor Hemovac drain output - brace when OOB and ambulating - added Miralax per patient's request.  - PTOT; recommending [facility] PA-C Department of Neurosurgery

## 2022-04-21 NOTE — Care Management Important Message (Signed)
Important Message  Patient Details  Name: Erin Petersen MRN: [id number] Date of Birth: 12/18/1940   Medicare Important Message Given:  Yes     [name] 04/21/2022, 10:42 AM

## 2022-04-21 NOTE — Progress Notes (Signed)
Physical Therapy Treatment Patient Details Name: Erin Petersen MRN: [id number] DOB: [birth year] Today's Date: 04/21/2022   History of Present Illness Erin Petersen is an 82 y/o female s/p T10- pelvis PSF on 04/17/22. PMH: Arthritis, asthma, DM, dyspnea, GERD, HTN, osteopenia, sleep apnea, UTI.    PT Comments    Pt premedicated prior to session. Significant progression with gait tolerance after ambulating 157f with RW, CGA, and cues for safety. Pt without c/o dizziness in standing. Good demonstration of med mobility and transfers with Supervision. Pt has family support at home and without DME needs. Continue per POC.   Recommendations for follow up therapy are one component of a multi-disciplinary discharge planning process, led by the attending physician.  Recommendations may be updated based on patient status, additional functional criteria and insurance authorization.  Follow Up Recommendations  Follow physician's recommendations for discharge plan and follow up therapies     Assistance Recommended at Discharge Intermittent Supervision/Assistance  Patient can return home with the following A little help with walking and/or transfers;A little help with bathing/dressing/bathroom;Assist for transportation;Help with stairs or ramp for entrance   Equipment Recommendations  None recommended by PT    Recommendations for Other Services       Precautions / Restrictions Precautions Precautions: Back;Fall Precaution Comments: log roll, no BLT Required Braces or Orthoses: Spinal Brace Spinal Brace: Thoracolumbosacral orthotic;Applied in sitting position Restrictions Weight Bearing Restrictions: No     Mobility  Bed Mobility Overal bed mobility: Needs Assistance Bed Mobility: Rolling, Sidelying to Sit, Sit to Sidelying Rolling: Supervision Sidelying to sit: Supervision     Sit to sidelying: Supervision General bed mobility comments: Pt has a mechanical bed at home.     Transfers Overall transfer level: Needs assistance Equipment used: Rolling walker (2 wheels) Transfers: Sit to/from Stand Sit to Stand: Supervision           General transfer comment: TLSO applied in sitting prior to mobility    Ambulation/Gait Ambulation/Gait assistance: Min guard Gait Distance (Feet): 150 Feet Assistive device: Rolling walker (2 wheels) Gait Pattern/deviations: WFL(Within Functional Limits), Step-through pattern       General Gait Details:  (Good progression with gait, no LOB or c/o dizziness)   Stairs             Wheelchair Mobility    Modified Rankin (Stroke Patients Only)       Balance Overall balance assessment: Needs assistance Sitting-balance support: Feet supported Sitting balance-Leahy Scale: Good     Standing balance support: Bilateral upper extremity supported, Reliant on assistive device for balance, During functional activity Standing balance-Leahy Scale: Good                              Cognition Arousal/Alertness: Awake/alert Behavior During Therapy: WFL for tasks assessed/performed Overall Cognitive Status: Within Functional Limits for tasks assessed                                 General Comments: Pleasant, eager to work with therapy        Exercises General Exercises - Lower Extremity Ankle Circles/Pumps: AROM, 10 reps Long Arc Quad: AROM, Both, 10 reps Heel Slides: AROM, 10 reps    General Comments General comments (skin integrity, edema, etc.):  (Pt pre-medicated prior to session. Education provided on safe transfers and fall prevention.)      Pertinent Vitals/Pain Pain Assessment  Pain Assessment: 0-10 Pain Score: 4  Pain Location: groin Pain Descriptors / Indicators: Sore, Sharp, Shooting Pain Intervention(s): Premedicated before session, Limited activity within patient's tolerance    Home Living                          Prior Function            PT  Goals (current goals can now be found in the care plan section) Acute Rehab PT Goals Patient Stated Goal: go home Progress towards PT goals: Progressing toward goals    Frequency    7X/week      PT Plan Current plan remains appropriate    Co-evaluation              AM-PAC PT "6 Clicks" Mobility   Outcome Measure  Help needed turning from your back to your side while in a flat bed without using bedrails?: A Little Help needed moving from lying on your back to sitting on the side of a flat bed without using bedrails?: A Lot Help needed moving to and from a bed to a chair (including a wheelchair)?: A Little Help needed standing up from a chair using your arms (e.g., wheelchair or bedside chair)?: A Little Help needed to walk in hospital room?: A Little Help needed climbing 3-5 steps with a railing? : A Lot 6 Click Score: 16    End of Session Equipment Utilized During Treatment: Gait belt;Back brace Activity Tolerance: Patient tolerated treatment well Patient left: with call bell/phone within reach;with bed alarm set Nurse Communication: Mobility status PT Visit Diagnosis: Unsteadiness on feet (R26.81);Muscle weakness (generalized) (M62.81);Pain Pain - Right/Left: Right Pain - part of body:  (Groin)     Time: 1230-1305 PT Time Calculation (min) (ACUTE ONLY): 35 min  Charges:  $Gait Training: 8-22 mins $Therapeutic Exercise: 8-22 mins                    Mikel Cella, PTA    Josie Dixon 04/21/2022, 4:44 PM

## 2022-04-21 NOTE — Progress Notes (Signed)
Occupational Therapy Treatment Patient Details Name: Erin Petersen MRN: [id number] DOB: [birth year] Today's Date: 04/21/2022   History of present illness Erin Petersen is an 82 y/o female s/p T10- pelvis PSF on 04/17/22. PMH: Arthritis, asthma, DM, dyspnea, GERD, HTN, osteopenia, sleep apnea, UTI.   OT comments  Chart reviewed, pt greeted in bed, agreeable to OT tx session. Session targeted improving functional ADL status in the setting of surgery and new precautions. Improvements noted in bed mobility with carry over noted from previous sessions, requiring supervision for supine>sit using log roll technique, MIN A for sit>supine for management of BLE. Pt has adjustable bed at home she reports she will use. Improvements also noted in standing tolerance with pt standing at sink for approx 8 minutes during standing grooming tasks. Toileting transfer and tasks completed with supervision. Overall pt continues to make progress towards goals, discharge recommendation remains appropriate. She reports hre sister is coming to stay with her for a period of time to assist as needed. All lines/leads/drains intact pre/post session. OT will follow acutely.    Recommendations for follow up therapy are one component of a multi-disciplinary discharge planning process, led by the attending physician.  Recommendations may be updated based on patient status, additional functional criteria and insurance authorization.    Follow Up Recommendations  Home health OT     Assistance Recommended at Discharge Intermittent Supervision/Assistance  Patient can return home with the following  A little help with walking and/or transfers;Assistance with cooking/housework;Assist for transportation;Help with stairs or ramp for entrance;A little help with bathing/dressing/bathroom   Equipment Recommendations  None recommended by OT    Recommendations for Other Services      Precautions / Restrictions  Precautions Precautions: Back;Fall Required Braces or Orthoses: Spinal Brace Spinal Brace: Thoracolumbosacral orthotic;Applied in sitting position Restrictions Weight Bearing Restrictions: No       Mobility Bed Mobility Overal bed mobility: Needs Assistance Bed Mobility: Rolling, Sidelying to Sit, Sit to Sidelying Rolling: Supervision Sidelying to sit: Supervision     Sit to sidelying: Min assist (management of BLE) General bed mobility comments: good carry over of technique from previous tx sessions with heavy use of bed rails    Transfers Overall transfer level: Needs assistance Equipment used: Rolling walker (2 wheels) Transfers: Sit to/from Stand Sit to Stand: Supervision                 Balance Overall balance assessment: Needs assistance Sitting-balance support: Feet supported       Standing balance support: Bilateral upper extremity supported, Reliant on assistive device for balance, During functional activity Standing balance-Leahy Scale: Good                             ADL either performed or assessed with clinical judgement   ADL Overall ADL's : Needs assistance/impaired     Grooming: Standing;Supervision/safety;Set up;Wash/dry face;Oral care Grooming Details (indicate cue type and reason): sink level approx 8 minutes           Upper Body Dressing Details (indicate cue type and reason): MOD A to donn TLSO, good self direction of fit     Toilet Transfer: Supervision/safety;Rolling walker (2 wheels);BSC/3in1   Toileting- Clothing Manipulation and Hygiene: Supervision/safety;Sit to/from stand;Adhering to back precautions       Functional mobility during ADLs: Supervision/safety;Rolling walker (2 wheels) (approx 15' to attempts in room)      Extremity/Trunk Assessment  Vision       Perception     Praxis      Cognition Arousal/Alertness: Awake/alert Behavior During Therapy: WFL for tasks  assessed/performed Overall Cognitive Status: Within Functional Limits for tasks assessed                                          Exercises      Shoulder Instructions       General Comments      Pertinent Vitals/ Pain       Pain Assessment Pain Assessment: 0-10 Pain Score: 8  Pain Location: groin Pain Descriptors / Indicators: Sore, Sharp, Shooting Pain Intervention(s): Limited activity within patient's tolerance, Monitored during session, Ice applied  Home Living                                          Prior Functioning/Environment              Frequency  Min 2X/week        Progress Toward Goals  OT Goals(current goals can now be found in the care plan section)  Progress towards OT goals: Progressing toward goals     Plan Discharge plan remains appropriate;Frequency remains appropriate    Co-evaluation                 AM-PAC OT "6 Clicks" Daily Activity     Outcome Measure   Help from another person eating meals?: None Help from another person taking care of personal grooming?: A Little Help from another person toileting, which includes using toliet, bedpan, or urinal?: A Little Help from another person bathing (including washing, rinsing, drying)?: A Lot Help from another person to put on and taking off regular upper body clothing?: A Little Help from another person to put on and taking off regular lower body clothing?: A Lot 6 Click Score: 17    End of Session Equipment Utilized During Treatment: Rolling walker (2 wheels);Back brace  OT Visit Diagnosis: Pain;Other abnormalities of gait and mobility (R26.89);Muscle weakness (generalized) (M62.81)   Activity Tolerance Patient tolerated treatment well   Patient Left with call bell/phone within reach;in bed;with bed alarm set   Nurse Communication          Time: [id number] OT Time Calculation (min): 30 min  Charges: OT General Charges $OT Visit: 1  Visit OT Treatments $Self Care/Home Management : 8-22 mins $Therapeutic Activity: 8-22 mins  Shanon Payor, OTD OTR/L  04/21/22, 3:36 PM

## 2022-04-22 MED ORDER — CELECOXIB 200 MG PO CAPS
200.0000 mg | ORAL_CAPSULE | Freq: Two times a day (BID) | ORAL | Status: DC
  Administered 2022-04-22 – 2022-04-24 (×5): 200 mg via ORAL
  Filled 2022-04-22 (×5): qty 1

## 2022-04-22 MED ORDER — BISACODYL 5 MG PO TBEC
5.0000 mg | DELAYED_RELEASE_TABLET | Freq: Every day | ORAL | Status: DC | PRN
  Administered 2022-04-22: 5 mg via ORAL
  Filled 2022-04-22: qty 1

## 2022-04-22 MED ORDER — FLEET ENEMA 7-19 GM/118ML RE ENEM
1.0000 | ENEMA | Freq: Every day | RECTAL | Status: DC | PRN
  Administered 2022-04-22: 1 via RECTAL

## 2022-04-22 NOTE — Progress Notes (Signed)
Mobility Specialist - Progress Note   04/22/22 1100  Mobility  Activity Ambulated with assistance in hallway;Stood at bedside;Transferred from chair to bed  Level of Assistance Contact guard assist, steadying assist  Assistive Device Front wheel walker  Distance Ambulated (ft) 200 ft  Activity Response Tolerated well  Mobility Referral Yes  $Mobility charge 1 Mobility   Pt resting in bed on RA upon entry. Pt endorses 7/10 pain but still wanting to participate in mobility. Pt STS and ambulates to hallway CGA with AD. Pt took a seated rest break and wheeled back to room in recliner. Pt left with needs in reach and bed alarm activated. MD present in room.   Loma Sender Mobility Specialist 04/22/22, 11:28 AM

## 2022-04-22 NOTE — Plan of Care (Signed)
  Problem: Education: Goal: Ability to verbalize activity precautions or restrictions will improve Outcome: Progressing   Problem: Pain Management: Goal: Pain level will decrease Outcome: Progressing   Problem: Skin Integrity: Goal: Will show signs of wound healing Outcome: Progressing   Problem: Pain Managment: Goal: General experience of comfort will improve Outcome: Progressing   Problem: Safety: Goal: Ability to remain free from injury will improve Outcome: Progressing   Problem: Skin Integrity: Goal: Risk for impaired skin integrity will decrease Outcome: Progressing

## 2022-04-22 NOTE — Progress Notes (Signed)
Physical Therapy Treatment Patient Details Name: Erin Petersen MRN: [id number] DOB: [birth year] Today's Date: 04/22/2022   History of Present Illness OSIA AYE is an 82 y/o female s/p T10- pelvis PSF on 04/17/22. PMH: Arthritis, asthma, DM, dyspnea, GERD, HTN, osteopenia, sleep apnea, UTI.    PT Comments    Pt is progressing performing transfers, bed mobility, and gait with RW at an overall supervision level.  Pt reported more back soreness this session and requested a decrease in gait distance with RW 75'. Pt continues to require A with donning and doffing TSLO brace.  Current PT D/C plan is still appropriate.   Recommendations for follow up therapy are one component of a multi-disciplinary discharge planning process, led by the attending physician.  Recommendations may be updated based on patient status, additional functional criteria and insurance authorization.  Follow Up Recommendations  Follow physician's recommendations for discharge plan and follow up therapies     Assistance Recommended at Discharge Intermittent Supervision/Assistance  Patient can return home with the following A little help with walking and/or transfers;A little help with bathing/dressing/bathroom;Assist for transportation;Help with stairs or ramp for entrance   Equipment Recommendations  None recommended by PT    Recommendations for Other Services       Precautions / Restrictions Precautions Precautions: Back;Fall Precaution Comments: log roll, no BLT Required Braces or Orthoses: Spinal Brace Spinal Brace: Thoracolumbosacral orthotic;Applied in sitting position Restrictions Weight Bearing Restrictions: No     Mobility  Bed Mobility Overal bed mobility: Needs Assistance Bed Mobility: Rolling, Sidelying to Sit, Sit to Sidelying Rolling: Supervision Sidelying to sit: Supervision     Sit to sidelying: Supervision General bed mobility comments: Pt has a mechanical bed at home.     Transfers Overall transfer level: Needs assistance Equipment used: Rolling walker (2 wheels) Transfers: Sit to/from Stand Sit to Stand: Supervision           General transfer comment: TLSO applied in sitting prior to mobility; cues for safe hand placement    Ambulation/Gait Ambulation/Gait assistance: Supervision Gait Distance (Feet): 75 Feet Assistive device: Rolling walker (2 wheels) Gait Pattern/deviations: WFL(Within Functional Limits), Step-through pattern Gait velocity: decreased     General Gait Details: Pt had walked 200 f t early with Rehab mobility and felt back was more sore.  Pt wanted to shorten gait distance this session.   Stairs             Wheelchair Mobility    Modified Rankin (Stroke Patients Only)       Balance Overall balance assessment: Needs assistance Sitting-balance support: Feet supported Sitting balance-Leahy Scale: Good     Standing balance support: Reliant on assistive device for balance, During functional activity, Single extremity supported Standing balance-Leahy Scale: Good Standing balance comment: Able to stand without UE support, but tends to lean onto counter or walker with fatigue.                            Cognition Arousal/Alertness: Awake/alert Behavior During Therapy: WFL for tasks assessed/performed                                   General Comments: Pleasant, eager to work with therapy        Exercises      General Comments        Pertinent Vitals/Pain Pain Assessment Pain Assessment: Faces Faces  Pain Scale: Hurts little more Pain Location: back Pain Descriptors / Indicators: Sore Pain Intervention(s): Limited activity within patient's tolerance    Home Living                          Prior Function            PT Goals (current goals can now be found in the care plan section) Acute Rehab PT Goals Patient Stated Goal: go home Time For Goal Achievement:  05/03/22 Potential to Achieve Goals: Fair Progress towards PT goals: Progressing toward goals    Frequency    7X/week      PT Plan Current plan remains appropriate    Co-evaluation              AM-PAC PT "6 Clicks" Mobility   Outcome Measure  Help needed turning from your back to your side while in a flat bed without using bedrails?: A Little Help needed moving from lying on your back to sitting on the side of a flat bed without using bedrails?: A Little Help needed moving to and from a bed to a chair (including a wheelchair)?: A Little Help needed standing up from a chair using your arms (e.g., wheelchair or bedside chair)?: A Little Help needed to walk in hospital room?: A Little Help needed climbing 3-5 steps with a railing? : A Lot 6 Click Score: 17    End of Session Equipment Utilized During Treatment: Gait belt;Back brace Activity Tolerance: Patient tolerated treatment well;Patient limited by pain Patient left: with call bell/phone within reach;with chair alarm set Nurse Communication: Mobility status PT Visit Diagnosis: Unsteadiness on feet (R26.81);Muscle weakness (generalized) (M62.81);Pain Pain - part of body:  (back)     Time: [id number] PT Time Calculation (min) (ACUTE ONLY): 24 min  Charges:  $Gait Training: 8-22 mins $Therapeutic Activity: 8-22 mins                     Bjorn Loser, PTA  04/22/22, 1:38 PM

## 2022-04-23 MED ORDER — MAGNESIUM CITRATE PO SOLN
1.0000 | Freq: Once | ORAL | Status: AC
  Administered 2022-04-23: 1 via ORAL
  Filled 2022-04-23: qty 296

## 2022-04-23 MED ORDER — METHOCARBAMOL 500 MG PO TABS: 500.0000 mg | ORAL_TABLET | Freq: Four times a day (QID) | ORAL | 0 refills | Status: DC | PRN

## 2022-04-23 MED ORDER — OXYCODONE HCL 5 MG PO TABS: 5.0000 mg | ORAL_TABLET | ORAL | 0 refills | Status: AC | PRN

## 2022-04-23 MED ORDER — BISACODYL 5 MG PO TBEC: 5.0000 mg | DELAYED_RELEASE_TABLET | Freq: Every day | ORAL | 0 refills | Status: DC | PRN

## 2022-04-23 MED ORDER — SORBITOL 70 % SOLN
960.0000 mL | TOPICAL_OIL | Freq: Every day | ORAL | Status: DC | PRN
  Filled 2022-04-23: qty 240

## 2022-04-23 MED ORDER — CELECOXIB 200 MG PO CAPS: 200.0000 mg | ORAL_CAPSULE | Freq: Two times a day (BID) | ORAL | 0 refills | Status: DC

## 2022-04-23 MED ORDER — BISACODYL 5 MG PO TBEC
5.0000 mg | DELAYED_RELEASE_TABLET | Freq: Every day | ORAL | Status: DC | PRN
  Administered 2022-04-23 – 2022-04-24 (×2): 15 mg via ORAL
  Filled 2022-04-23 (×2): qty 3

## 2022-04-23 NOTE — Progress Notes (Signed)
Physical Therapy Treatment Patient Details Name: Erin Petersen MRN: [id number] DOB: 05/30/1940 Today's Date: 04/23/2022   History of Present Illness IKESHA Petersen is an 82 y/o female s/p T10- pelvis PSF on 04/17/22. PMH: Arthritis, asthma, DM, dyspnea, GERD, HTN, osteopenia, sleep apnea, UTI.    PT Comments    Pt ready for session.  Reports 8/10 back pain but declined pain.  She is able to transition to sitting with rail.  Assist to don brace.  Stated sister is here to help upon discharge and she feels good about giving her cues as needed to help her put on brace.  Steady in sitting for meds with nursing.  She is able to stand and walk x 1 lap on unit with RW and min guard.  Generally steady but some weakness is noted.  Voided upon return to room but remained in chair with needs met after session.   Recommendations for follow up therapy are one component of a multi-disciplinary discharge planning process, led by the attending physician.  Recommendations may be updated based on patient status, additional functional criteria and insurance authorization.  Follow Up Recommendations  Follow physician's recommendations for discharge plan and follow up therapies     Assistance Recommended at Discharge Intermittent Supervision/Assistance  Patient can return home with the following A little help with walking and/or transfers;A little help with bathing/dressing/bathroom;Assist for transportation;Help with stairs or ramp for entrance   Equipment Recommendations  None recommended by PT    Recommendations for Other Services       Precautions / Restrictions Precautions Precautions: Back;Fall Precaution Comments: log roll, no BLT Required Braces or Orthoses: Spinal Brace Spinal Brace: Thoracolumbosacral orthotic;Applied in sitting position Restrictions Weight Bearing Restrictions: No     Mobility  Bed Mobility Overal bed mobility: Needs Assistance Bed Mobility: Rolling, Sidelying to  Sit, Sit to Sidelying Rolling: Supervision Sidelying to sit: Supervision     Sit to sidelying: Supervision   Patient Response: Cooperative  Transfers Overall transfer level: Needs assistance Equipment used: Rolling walker (2 wheels) Transfers: Sit to/from Stand Sit to Stand: Supervision                Ambulation/Gait Ambulation/Gait assistance: Supervision Gait Distance (Feet): 160 Feet Assistive device: Rolling walker (2 wheels) Gait Pattern/deviations: WFL(Within Functional Limits), Step-through pattern Gait velocity: decreased         Stairs             Wheelchair Mobility    Modified Rankin (Stroke Patients Only)       Balance Overall balance assessment: Needs assistance Sitting-balance support: Feet supported Sitting balance-Leahy Scale: Good     Standing balance support: Reliant on assistive device for balance, During functional activity, Single extremity supported Standing balance-Leahy Scale: Good Standing balance comment: no LOB's.  recommend +1 for general safety and to assist with donning brace today but overall does well.                            Cognition Arousal/Alertness: Awake/alert Behavior During Therapy: WFL for tasks assessed/performed Overall Cognitive Status: Within Functional Limits for tasks assessed                                 General Comments: Pleasant, eager to work with therapy        Exercises Other Exercises Other Exercises: to bathroom to void but no BM  General Comments        Pertinent Vitals/Pain Pain Assessment Pain Assessment: 0-10 Pain Score: 8  Pain Location: back Pain Descriptors / Indicators: Sore Pain Intervention(s): Limited activity within patient's tolerance, Monitored during session, Repositioned    Home Living                          Prior Function            PT Goals (current goals can now be found in the care plan section) Progress  towards PT goals: Progressing toward goals    Frequency    7X/week      PT Plan Current plan remains appropriate    Co-evaluation              AM-PAC PT "6 Clicks" Mobility   Outcome Measure  Help needed turning from your back to your side while in a flat bed without using bedrails?: A Little Help needed moving from lying on your back to sitting on the side of a flat bed without using bedrails?: A Little Help needed moving to and from a bed to a chair (including a wheelchair)?: A Little Help needed standing up from a chair using your arms (e.g., wheelchair or bedside chair)?: A Little Help needed to walk in hospital room?: A Little Help needed climbing 3-5 steps with a railing? : A Little 6 Click Score: 18    End of Session Equipment Utilized During Treatment: Gait belt;Back brace Activity Tolerance: Patient tolerated treatment well Patient left: with call bell/phone within reach;with chair alarm set;in chair Nurse Communication: Mobility status PT Visit Diagnosis: Unsteadiness on feet (R26.81);Muscle weakness (generalized) (M62.81);Pain Pain - Right/Left: Right     Time: [id number] PT Time Calculation (min) (ACUTE ONLY): 31 min  Charges:  $Gait Training: 23-37 mins                   Chesley Noon, PTA 04/23/22, 12:36 PM

## 2022-04-23 NOTE — Progress Notes (Signed)
Mobility Specialist - Progress Note     04/23/22 1505  Mobility  Activity Ambulated with assistance in hallway;Stood at bedside  Level of Assistance Contact guard assist, steadying assist  Assistive Device Front wheel walker  Distance Ambulated (ft) 100 ft  Activity Response Tolerated well  Mobility Referral Yes  $Mobility charge 1 Mobility   Pt resting in fetal position in bed on RA. Pt STS and ambulates (w/ back brace) to hallway around NS with AD. Pt returned to bed and left with needs in reach and bed alarm activated. (RN Notified of persistent pain and constipation.)

## 2022-04-23 NOTE — Progress Notes (Signed)
Mobility Specialist - Progress Note   04/23/22 0900  Mobility  Activity Ambulated with assistance in hallway;Stood at bedside;Dangled on edge of bed  Level of Assistance Contact guard assist, steadying assist  Assistive Device Front wheel walker  Distance Ambulated (ft) 100 ft  Activity Response Tolerated well  Mobility Referral Yes  $Mobility charge 1 Mobility   Pt resting in bed on RA upon entry. Pt STS and ambulates to hallway CGA around NS with AD. Pt returned to bed and left with needs in reach and bed alarm activated.   Loma Sender Mobility Specialist 04/23/22, 1:07 PM

## 2022-04-23 NOTE — Progress Notes (Signed)
Mobility Specialist - Progress Note     04/23/22 1000  Mobility  Activity Ambulated with assistance to bathroom;Stood at bedside;Dangled on edge of bed  Level of Assistance Contact guard assist, steadying assist  Assistive Device Front wheel walker  Distance Ambulated (ft) 10 ft  Activity Response Tolerated well  Mobility Referral Yes  $Mobility charge 1 Mobility   Pt resting in bed on RA upon entry. Pt STS and ambulates to bathroom CGA with AD. Pt returned to bed and left with needs in reach and bed alarm activated.   Loma Sender Mobility Specialist 04/23/22, 10:11 AM

## 2022-04-23 NOTE — Progress Notes (Signed)
Progress Note  History: Erin Petersen is s/p T10- pelvis PSF  POD6: continues to improve.  Had small BM. Passing gas.  POD5: Doing better  POD4: continued groin pain worse on the right than the left  POD3: Pt experienced urinary urgency yesterday. UA negative. Pt noting right groin pain this morning and increased pain overnight despite PRNs  POD2: Pt frustrated that she was not woken up overnight for pain medication and is now rating pain in her back at a 7-8/10. She admits she was given PRNs about 30-45 minutes ago. She denies any leg pain  POD1: pt experiencing symptomatic anemia overnight. Reports reasonable pain control    Physical Exam:  AA Ox3 CNI  Strength:5/5 throughout BLE    Assessment/Plan:  Erin Petersen is a 82 y.o s/p thoracopelvic fusion.   - mobilize - pain control - DVT prophylaxis - brace when OOB and ambulating - BM today - PTOT; recommending HH  Meade Maw MD Department of Neurosurgery

## 2022-04-23 NOTE — Discharge Summary (Addendum)
Physician Discharge Summary  Patient ID: CHASSY NORED MRN: [id number] DOB/AGE: 04-21-1940 82 y.o.  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 presented with pseudoarthrosis and the diagnoses above.  She presented for surgical intervention.  She tolerated the procedure well and was admitted to the hospital for pain control and mobilization.  After clearing PT and having her pain under control, she was stable for discharge on POD6.  Consults: None  Significant Diagnostic Studies: radiology: X-Ray: confirmation of implants  Treatments: surgery: Revision T10-P posterior fusion  Discharge Exam: Blood pressure (!) 161/70, pulse 70, temperature 97.9 F (36.6 C), resp. rate 15, height '4\' 10"'$  (1.473 m), weight 75.9 kg, SpO2 97 %. General appearance: alert and cooperative CNI MAEW  Disposition: home with home health  Discharge Instructions     Face-to-face encounter (required for Medicare/Medicaid patients)   Complete by: As directed    I Menachem Urbanek [place] certify that this patient is under my care and that I, or a nurse practitioner or physician's assistant working with me, had a face-to-face encounter that meets the physician face-to-face encounter requirements with this patient on 04/23/2022. The encounter with the patient was in whole, or in part for the following medical condition(s) which is the primary reason for home health care (List medical  condition): pseudoarthrosis   The encounter with the patient was in whole, or in part, for the following medical condition, which is the primary reason for home health care: pseudoarthrosis   I certify that, based on my findings, the following services are medically necessary home health services: Physical therapy   Reason for Medically Necessary Home Health Services: Therapy- Home Adaptation to Facilitate Safety   My clinical findings support the need for the above services: Pain interferes with ambulation/mobility   Further, I certify that my clinical findings support that this patient is homebound due to: Ambulates short distances less than 300 feet   Home Health   Complete by: As directed    To provide the following care/treatments:  PT OT     Incentive spirometry RT   Complete by: As directed       Allergies as of 04/23/2022       Reactions   Bee Venom Anaphylaxis, Hives   Fosamax [alendronate] Nausea Only   Fusion Plus [iron-fa-b Cmp-c-biot-probiotic] Nausea Only   Ciprofloxacin Hcl Rash   Mild rash after long term use   Sulfa Antibiotics Rash   Bactrim        Medication List     TAKE these medications    albuterol 108 (90 Base) MCG/ACT inhaler Commonly known as: VENTOLIN HFA Inhale 2 puffs into the lungs every 6 (six) hours as needed for wheezing or shortness of breath.   amitriptyline 25 MG tablet Commonly known as: ELAVIL TAKE 1 TABLET AT BEDTIME   amLODipine 5 MG tablet Commonly known as: NORVASC Take 1 tablet (5 mg total) by mouth daily.   aspirin EC 81 MG tablet Take 81 mg by mouth at bedtime.   bisacodyl 5 MG  EC tablet Commonly known as: DULCOLAX Take 1-2 tablets (5-10 mg total) by mouth daily as needed for moderate constipation.   calcitonin (salmon) 200 UNIT/ACT nasal spray Commonly known as: MIACALCIN/FORTICAL 1 spray daily.   calcium carbonate 750 MG chewable tablet Commonly known as: TUMS EX Chew 3 tablets by mouth daily.   celecoxib 200  MG capsule Commonly known as: CELEBREX Take 1 capsule (200 mg total) by mouth 2 (two) times daily.   EPINEPHrine 0.3 mg/0.3 mL Soaj injection Commonly known as: EPI-PEN Inject 0.3 mg into the muscle as needed for anaphylaxis.   fluticasone 50 MCG/ACT nasal spray Commonly known as: FLONASE Place 2 sprays into both nostrils daily. qhs   hydrochlorothiazide 25 MG tablet Commonly known as: HYDRODIURIL TAKE 1 TABLET DAILY   Melatonin 10 MG Caps Take 1 capsule by mouth as needed.   methocarbamol 500 MG tablet Commonly known as: ROBAXIN Take 500 mg by mouth every 8 (eight) hours as needed for muscle spasms. What changed: Another medication with the same name was added. Make sure you understand how and when to take each.   methocarbamol 500 MG tablet Commonly known as: ROBAXIN Take 1 tablet (500 mg total) by mouth every 6 (six) hours as needed for muscle spasms. What changed: You were already taking a medication with the same name, and this prescription was added. Make sure you understand how and when to take each.   omeprazole 40 MG capsule Commonly known as: PRILOSEC Take 1 capsule (40 mg total) by mouth daily before breakfast. What changed: when to take this   ondansetron 4 MG disintegrating tablet Commonly known as: ZOFRAN-ODT Take 1 tablet (4 mg total) by mouth every 8 (eight) hours as needed for nausea.   OVER THE COUNTER MEDICATION 750 mg. Calcium with Vit D and K   oxyCODONE 5 MG immediate release tablet Commonly known as: Oxy IR/ROXICODONE Take 1-3 tablets (5-15 mg total) by mouth every 4 (four) hours as needed for up to 7 days for moderate pain or severe pain (5 mg for pain 4-5, 10 mg for pain 6-7, 15 mg for pain 8 or above).   polyethylene glycol 17 g packet Commonly known as: MIRALAX / GLYCOLAX Take 1 packet by mouth daily.   pregabalin 300 MG capsule Commonly known as: LYRICA Take 300 mg by mouth 2 (two) times daily. 150 mg in am and 300 mg at bedtime    rOPINIRole 1 MG tablet Commonly known as: REQUIP Take 1 mg by mouth in the morning and at bedtime. 1 in am and 1 at 3pm   rOPINIRole 4 MG tablet Commonly known as: REQUIP TAKE 1 TABLET AT BEDTIME   simvastatin 20 MG tablet Commonly known as: ZOCOR TAKE 1 TABLET AT BEDTIME   TYLENOL PO Take 650 mg by mouth 5 (five) times daily.   Vitamin B-12 1000 MCG Subl Take 2,000 mcg by mouth daily.   vitamin C 1000 MG tablet Take 750 mg by mouth daily.   Vitamin D3 50 MCG (2000 UT) Tabs Generic drug: Cholecalciferol Take 2,000 Units by mouth daily.        Follow-up Information     Loleta Dicker, PA Follow up in 1 week(s).   Specialty: Neurosurgery Why: 05/02/22 at 130 pm Contact information: [address] [place] [address] [phone]                 Signed: Meade Maw 04/23/2022, 8:57 AM

## 2022-04-23 NOTE — Progress Notes (Addendum)
Progress Note  History: Erin Petersen is s/p T10- pelvis PSF  POD5: Doing better  POD4: continued groin pain worse on the right than the left  POD3: Pt experienced urinary urgency yesterday. UA negative. Pt noting right groin pain this morning and increased pain overnight despite PRNs  POD2: Pt frustrated that she was not woken up overnight for pain medication and is now rating pain in her back at a 7-8/10. She admits she was given PRNs about 30-45 minutes ago. She denies any leg pain  POD1: pt experiencing symptomatic anemia overnight. Reports reasonable pain control    Physical Exam:  AA Ox3 CNI  Strength:5/5 throughout BLE  HV: 110 out yesterday  Assessment/Plan:  AASTHA Petersen is a 82 y.o s/p thoracopelvic fusion.   - mobilize - pain control - DVT prophylaxis - Drain removed - brace when OOB and ambulating - BM today - PTOT; recommending HH  Meade Maw MD Department of Neurosurgery

## 2022-04-24 ENCOUNTER — Encounter: Payer: Self-pay | Admitting: Neurosurgery

## 2022-04-24 MED ORDER — FLEET ENEMA 7-19 GM/118ML RE ENEM
1.0000 | ENEMA | Freq: Once | RECTAL | Status: AC
  Administered 2022-04-24: 1 via RECTAL

## 2022-04-24 MED ORDER — MAGNESIUM CITRATE PO SOLN
1.0000 | Freq: Once | ORAL | Status: AC
  Administered 2022-04-24: 1 via ORAL
  Filled 2022-04-24: qty 296

## 2022-04-24 NOTE — TOC Progression Note (Signed)
Transition of Care [facility]) - Progression Note    Patient Details  Name: ANJELICA BERRES MRN: [id number] Date of Birth: 14-Apr-1940  Transition of Care [facility]) CM/SW Lillie, RN Phone Number: 04/24/2022, 8:48 AM  Clinical Narrative:    [facility] is set up for [facility] services, patient has DME at home Walking [address] with Mobility specialist    Expected Discharge Plan: [place] Barriers to Discharge: No Barriers Identified  Expected Discharge Plan and Services   Discharge Planning Services: CM Consult   Living arrangements for the past 2 months: Single Family Home Expected Discharge Date: 04/23/22               DME Arranged: N/A DME Agency: NA       HH Arranged: PT [place] Agency: [place] ([facility]) Date HH Agency Contacted: 04/24/22 Time [place]: [phone] Representative spoke with at [place]: [place] Determinants of Health ([place]) Interventions SDOH Screenings   Food Insecurity: No Food Insecurity (04/18/2022)  Housing: Low Risk  (04/18/2022)  Transportation Needs: No Transportation Needs (04/21/2022)  Utilities: At Risk (04/18/2022)  Alcohol Screen: Low Risk  (08/26/2021)  Depression (PHQ2-9): Low Risk  (08/26/2021)  Financial Resource Strain: Low Risk  (08/26/2021)  Physical Activity: Inactive (08/26/2021)  Social Connections: Moderately Integrated (08/26/2021)  Stress: No Stress Concern Present (08/26/2021)  Tobacco Use: Low Risk  (04/18/2022)    Readmission Risk Interventions     No data to display

## 2022-04-24 NOTE — Progress Notes (Signed)
Physical Therapy Treatment Patient Details Name: Erin Petersen MRN: [id number] DOB: May 21, 1940 Today's Date: 04/24/2022   History of Present Illness Erin Petersen is an 82 y/o female s/p T10- pelvis PSF on 04/17/22. PMH: Arthritis, asthma, DM, dyspnea, GERD, HTN, osteopenia, sleep apnea, UTI.       Recommendations for follow up therapy are one component of a multi-disciplinary discharge planning process, led by the attending physician.  Recommendations may be updated based on patient status, additional functional criteria and insurance authorization.  Follow Up Recommendations  Follow physician's recommendations for discharge plan and follow up therapies (pt wanting [place] services)     Assistance Recommended at Discharge Set up Supervision/Assistance  Patient can return home with the following A little help with walking and/or transfers;A little help with bathing/dressing/bathroom;Assist for transportation;Help with stairs or ramp for entrance   Equipment Recommendations  None recommended by PT       Precautions / Restrictions Precautions Precautions: Back;Fall Spinal Brace: Thoracolumbosacral orthotic;Applied in sitting position (pt c/o brace not fitting well however author felt brace was properly adjusted) Restrictions Weight Bearing Restrictions: No     Mobility  Bed Mobility Overal bed mobility: Needs Assistance Bed Mobility: Rolling, Sidelying to Sit, Sit to Sidelying Rolling: Supervision Sidelying to sit: Supervision  Sit to sidelying: Supervision   Transfers Overall transfer level: Needs assistance Equipment used: Rolling walker (2 wheels) Transfers: Sit to/from Stand Sit to Stand: Supervision      Ambulation/Gait Ambulation/Gait assistance: Supervision Gait Distance (Feet): 200 Feet Assistive device: Rolling walker (2 wheels) Gait Pattern/deviations: WFL(Within Functional Limits), Step-through pattern Gait velocity: decreased  General Gait Details: no LOB  or safety concerns with ambulation with RW + TLSO   Stairs Stairs: Yes  General stair comments: Discussed home entry  and educated pt on proper stair performance. pt states confidence in her abilities to safely perform stairs   Balance Overall balance assessment: Needs assistance Sitting-balance support: Feet supported Sitting balance-Leahy Scale: Good     Standing balance support: Bilateral upper extremity supported, During functional activity Standing balance-Leahy Scale: Good         Cognition Arousal/Alertness: Awake/alert Behavior During Therapy: WFL for tasks assessed/performed Overall Cognitive Status: Within Functional Limits for tasks assessed        General Comments: Pleasant, eager to work with therapy           General Comments General comments (skin integrity, edema, etc.): reviewed importance of routine mobility      Pertinent Vitals/Pain Pain Assessment Pain Assessment: 0-10 Pain Score: 3  Pain Location: back Pain Descriptors / Indicators: Sore Pain Intervention(s): Limited activity within patient's tolerance, Monitored during session, Premedicated before session, Repositioned     PT Goals (current goals can now be found in the care plan section) Acute Rehab PT Goals Patient Stated Goal: go home Progress towards PT goals: Progressing toward goals    Frequency    7X/week      PT Plan Current plan remains appropriate       AM-PAC PT "6 Clicks" Mobility   Outcome Measure  Help needed turning from your back to your side while in a flat bed without using bedrails?: A Little Help needed moving from lying on your back to sitting on the side of a flat bed without using bedrails?: A Little Help needed moving to and from a bed to a chair (including a wheelchair)?: A Little Help needed standing up from a chair using your arms (e.g., wheelchair or bedside chair)?: A Little  Help needed to walk in hospital room?: A Little Help needed climbing 3-5  steps with a railing? : A Little 6 Click Score: 18    End of Session Equipment Utilized During Treatment: Gait belt;Back brace Activity Tolerance: Patient tolerated treatment well Patient left: in chair;with call bell/phone within reach;with chair alarm set Nurse Communication: Mobility status PT Visit Diagnosis: Unsteadiness on feet (R26.81);Muscle weakness (generalized) (M62.81);Pain Pain - Right/Left: Right     Time: [id number] PT Time Calculation (min) (ACUTE ONLY): 33 min  Charges:  $Gait Training: 8-22 mins $Therapeutic Activity: 8-22 mins                     Julaine Fusi PTA 04/24/22, 10:06 AM

## 2022-04-24 NOTE — Plan of Care (Signed)
  Problem: Education: Goal: Ability to verbalize activity precautions or restrictions will improve Outcome: Progressing   Problem: Activity: Goal: Ability to avoid complications of mobility impairment will improve Outcome: Progressing   Problem: Clinical Measurements: Goal: Ability to maintain clinical measurements within normal limits will improve Outcome: Progressing   Problem: Pain Management: Goal: Pain level will decrease Outcome: Progressing   Problem: Health Behavior/Discharge Planning: Goal: Identification of resources available to assist in meeting health care needs will improve Outcome: Progressing   Problem: Bladder/Genitourinary: Goal: Urinary functional status for postoperative course will improve Outcome: Progressing

## 2022-04-24 NOTE — Progress Notes (Signed)
DISCHARGE NOTE:   Pt discharged with personal belongings in hand. Pt placed brace on. Discharge instructions provided, no further concerns. Pt wheeled down to medical mall entrance, transportation provided via pt's sister.

## 2022-04-24 NOTE — Progress Notes (Signed)
Progress Note  History: Erin Petersen is s/p T10- pelvis PSF  POD7: continued constipation without abdominal pain.   POD6: continues to improve.  Had small BM. Passing gas.  POD5: Doing better  POD4: continued groin pain worse on the right than the left  POD3: Pt experienced urinary urgency yesterday. UA negative. Pt noting right groin pain this morning and increased pain overnight despite PRNs  POD2: Pt frustrated that she was not woken up overnight for pain medication and is now rating pain in her back at a 7-8/10. She admits she was given PRNs about 30-45 minutes ago. She denies any leg pain  POD1: pt experiencing symptomatic anemia overnight. Reports reasonable pain control    Physical Exam:  AA Ox3 CNI  Strength:5/5 throughout BLE   Abdomen soft and non-distended.    Assessment/Plan:  Erin Petersen is a 82 y.o s/p thoracopelvic fusion.   - mobilize - pain control - DVT prophylaxis - brace when OOB and ambulating - continue bowel regimen. - PTOT; recommending [place] PA-C Department of Neurosurgery

## 2022-04-24 NOTE — Care Management Important Message (Signed)
Important Message  Patient Details  Name: Erin Petersen MRN: [id number] Date of Birth: Jun 13, 1940   Medicare Important Message Given:  Yes     [name] 04/24/2022, 10:27 AM

## 2022-04-25 ENCOUNTER — Telehealth: Payer: Self-pay | Admitting: *Deleted

## 2022-04-25 DIAGNOSIS — M960 Pseudarthrosis after fusion or arthrodesis: Secondary | ICD-10-CM | POA: Diagnosis not present

## 2022-04-25 DIAGNOSIS — Z96653 Presence of artificial knee joint, bilateral: Secondary | ICD-10-CM | POA: Diagnosis not present

## 2022-04-25 DIAGNOSIS — M4014 Other secondary kyphosis, thoracic region: Secondary | ICD-10-CM | POA: Diagnosis not present

## 2022-04-25 DIAGNOSIS — Z981 Arthrodesis status: Secondary | ICD-10-CM | POA: Diagnosis not present

## 2022-04-25 DIAGNOSIS — M4854XD Collapsed vertebra, not elsewhere classified, thoracic region, subsequent encounter for fracture with routine healing: Secondary | ICD-10-CM | POA: Diagnosis not present

## 2022-04-25 DIAGNOSIS — G4733 Obstructive sleep apnea (adult) (pediatric): Secondary | ICD-10-CM | POA: Diagnosis not present

## 2022-04-25 DIAGNOSIS — M4324 Fusion of spine, thoracic region: Secondary | ICD-10-CM | POA: Diagnosis not present

## 2022-04-25 DIAGNOSIS — M532X4 Spinal instabilities, thoracic region: Secondary | ICD-10-CM | POA: Diagnosis not present

## 2022-04-25 DIAGNOSIS — M6281 Muscle weakness (generalized): Secondary | ICD-10-CM | POA: Diagnosis not present

## 2022-04-25 NOTE — Transitions of Care (Post Inpatient/ED Visit) (Signed)
   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 Patient Questions/Concerns Addressed: Other: (Per orders patient may take off dressing on [id number]. May shower and pat dry. No tub baths or swimming)  Items Reviewed: Did you receive and understand the discharge instructions provided?: Yes Medications obtained and verified?: Yes (Medications Reviewed) Any new allergies since your discharge?: No Dietary orders reviewed?: No Do you have support at home?: Yes People in Home: sibling(s), child(ren), adult Name of Support/Comfort Primary Source: Daughter laura and sister  [place] and Equipment/Supplies: [place] Ordered?: Yes Name of [place]:: [place] set up a time to come to your home?: No EMR reviewed for [place] Orders: Orders present/patient has not received call (refer to CM for follow-up) (RN gave patient the number to follow up with contacting [facility].) Any new equipment or medical supplies ordered?: No  Functional Questionnaire: Do you need assistance with bathing/showering or dressing?: No Do you need assistance with meal preparation?: Yes Do you need assistance with eating?: No Do you have difficulty maintaining continence: No Do you need assistance with getting out of bed/getting out of a chair/moving?: No Do you have difficulty managing or taking your  medications?: No  Folllow up appointments reviewed: PCP Follow-up appointment confirmed?: [facility] Follow-up appointment confirmed?: Yes Date of Specialist follow-up appointment?: 05/01/22 Follow-Up Specialty Provider:: Cooper Render PA neurosurgery Do you need transportation to your follow-up appointment?: No Do you understand care options if your condition(s) worsen?: Yes-patient verbalized understanding  SDOH Interventions Today    Flowsheet Row Most Recent Value  SDOH Interventions   Food Insecurity Interventions Intervention Not Indicated  Housing Interventions Intervention Not Indicated  Transportation Interventions Intervention Not Indicated        [place] Management [phone]

## 2022-04-27 ENCOUNTER — Telehealth: Payer: Self-pay | Admitting: Family Medicine

## 2022-04-27 ENCOUNTER — Telehealth: Payer: Self-pay

## 2022-04-27 DIAGNOSIS — M532X4 Spinal instabilities, thoracic region: Secondary | ICD-10-CM | POA: Diagnosis not present

## 2022-04-27 DIAGNOSIS — Z96653 Presence of artificial knee joint, bilateral: Secondary | ICD-10-CM | POA: Diagnosis not present

## 2022-04-27 DIAGNOSIS — M960 Pseudarthrosis after fusion or arthrodesis: Secondary | ICD-10-CM | POA: Diagnosis not present

## 2022-04-27 DIAGNOSIS — M4014 Other secondary kyphosis, thoracic region: Secondary | ICD-10-CM | POA: Diagnosis not present

## 2022-04-27 DIAGNOSIS — M4324 Fusion of spine, thoracic region: Secondary | ICD-10-CM | POA: Diagnosis not present

## 2022-04-27 DIAGNOSIS — M6281 Muscle weakness (generalized): Secondary | ICD-10-CM | POA: Diagnosis not present

## 2022-04-27 DIAGNOSIS — Z981 Arthrodesis status: Secondary | ICD-10-CM | POA: Diagnosis not present

## 2022-04-27 DIAGNOSIS — G4733 Obstructive sleep apnea (adult) (pediatric): Secondary | ICD-10-CM | POA: Diagnosis not present

## 2022-04-27 DIAGNOSIS — M4854XD Collapsed vertebra, not elsewhere classified, thoracic region, subsequent encounter for fracture with routine healing: Secondary | ICD-10-CM | POA: Diagnosis not present

## 2022-04-27 NOTE — Telephone Encounter (Signed)
Please ok those verbal orders  

## 2022-04-27 NOTE — Telephone Encounter (Signed)
[place] Name: [facility] Agency Name: [facility] Max[facility] Pe[facility] Phone #: [phone] Secure  Service Requested: OT (examples: OT/PT/Skilled Nursing/Social Work/Speech Therapy/Wound Care)  Frequency of Visits: 1 week 1, and then 2 week 5

## 2022-04-27 NOTE — Telephone Encounter (Signed)
[place] Night - Client Nonclinical Telephone Record  [facility] Client [facility] Night - Client Client Site [place] Provider Loura Pardon - MD Contact Type Call Who Is Calling Patient / Member / Family / Caregiver Caller Name Ninfa Linden with [place] Phone Number [phone] Patient Name Erin Petersen Patient DOB 1940-10-20 Call Type Message Only Information Provided Reason for Call Request for General Office Information Initial Comment Caller states she is calling to request approval for her plan of care orders. Additional Comment This is a secure line for a verbal. 1 week 1, 2 week 3, 1 week 5. Disp. Time Disposition Final User 04/26/2022 5:11:16 PM General Information Provided Yes Maureen Ralphs Call Closed By: Maureen Ralphs Transaction Date/Time: 04/26/2022 5:02:56 PM (ET  Sending note to Dr Glori Bickers and [employer].

## 2022-04-27 NOTE — Telephone Encounter (Signed)
Left VM giving VO ?

## 2022-04-27 NOTE — Telephone Encounter (Signed)
VO given to [employer]

## 2022-04-27 NOTE — Telephone Encounter (Signed)
[place] Name: [facility] Agency Name: [place] Phone #: [phone] Secure  Service Requested: PT (examples: OT/PT/Skilled Nursing/Social Work/Speech Therapy/Wound Care)  Frequency of Visits: 1 week 1, 2 week 3, 1 week 5

## 2022-04-28 ENCOUNTER — Encounter: Payer: Medicare Other | Admitting: Pharmacist

## 2022-04-28 NOTE — Telephone Encounter (Signed)
VO given to [place]

## 2022-04-29 ENCOUNTER — Encounter: Payer: Self-pay | Admitting: Neurosurgery

## 2022-05-02 ENCOUNTER — Telehealth: Payer: Self-pay | Admitting: Family Medicine

## 2022-05-02 ENCOUNTER — Ambulatory Visit (INDEPENDENT_AMBULATORY_CARE_PROVIDER_SITE_OTHER): Payer: Medicare Other | Admitting: Neurosurgery

## 2022-05-02 ENCOUNTER — Ambulatory Visit
Admission: RE | Admit: 2022-05-02 | Discharge: 2022-05-02 | Disposition: A | Discharge status: Home / Self Care | Payer: Medicare Other | Source: Ambulatory Visit | Attending: Neurosurgery | Admitting: Neurosurgery

## 2022-05-02 ENCOUNTER — Encounter: Payer: Self-pay | Admitting: Neurosurgery

## 2022-05-02 ENCOUNTER — Ambulatory Visit
Admission: RE | Admit: 2022-05-02 | Discharge: 2022-05-02 | Disposition: A | Discharge status: Home / Self Care | Payer: Medicare Other | Attending: Neurosurgery | Admitting: Neurosurgery

## 2022-05-02 VITALS — BP 130/62 | Temp 98.3°F | Ht <= 58 in | Wt 167.0 lb

## 2022-05-02 DIAGNOSIS — S22080D Wedge compression fracture of T11-T12 vertebra, subsequent encounter for fracture with routine healing: Secondary | ICD-10-CM

## 2022-05-02 DIAGNOSIS — M6281 Muscle weakness (generalized): Secondary | ICD-10-CM | POA: Diagnosis not present

## 2022-05-02 DIAGNOSIS — Z981 Arthrodesis status: Secondary | ICD-10-CM | POA: Insufficient documentation

## 2022-05-02 DIAGNOSIS — Z09 Encounter for follow-up examination after completed treatment for conditions other than malignant neoplasm: Secondary | ICD-10-CM

## 2022-05-02 DIAGNOSIS — Z96653 Presence of artificial knee joint, bilateral: Secondary | ICD-10-CM | POA: Diagnosis not present

## 2022-05-02 DIAGNOSIS — M4014 Other secondary kyphosis, thoracic region: Secondary | ICD-10-CM | POA: Diagnosis not present

## 2022-05-02 DIAGNOSIS — M532X4 Spinal instabilities, thoracic region: Secondary | ICD-10-CM | POA: Diagnosis not present

## 2022-05-02 DIAGNOSIS — M545 Low back pain, unspecified: Secondary | ICD-10-CM | POA: Diagnosis not present

## 2022-05-02 DIAGNOSIS — M4324 Fusion of spine, thoracic region: Secondary | ICD-10-CM | POA: Diagnosis not present

## 2022-05-02 DIAGNOSIS — M47814 Spondylosis without myelopathy or radiculopathy, thoracic region: Secondary | ICD-10-CM | POA: Diagnosis not present

## 2022-05-02 DIAGNOSIS — G4733 Obstructive sleep apnea (adult) (pediatric): Secondary | ICD-10-CM | POA: Diagnosis not present

## 2022-05-02 DIAGNOSIS — M960 Pseudarthrosis after fusion or arthrodesis: Secondary | ICD-10-CM | POA: Diagnosis not present

## 2022-05-02 DIAGNOSIS — M4854XD Collapsed vertebra, not elsewhere classified, thoracic region, subsequent encounter for fracture with routine healing: Secondary | ICD-10-CM | POA: Diagnosis not present

## 2022-05-02 MED ORDER — OXYCODONE HCL 5 MG PO TABS: 5.0000 mg | ORAL_TABLET | Freq: Four times a day (QID) | ORAL | 0 refills | Status: AC | PRN

## 2022-05-02 NOTE — Telephone Encounter (Signed)
Spoke to [place]. She said she slid on to the floor. Said she expressed she had some pain on the right side of her back but it was not bad. She happened to have an OV with her surgeon to F/U her thoracopelvic fusion. Hopefully they evaluated her.

## 2022-05-02 NOTE — Progress Notes (Signed)
   REFERRING PHYSICIAN:  Abner Greenspan, [address] [place],  [place] [id number]  DOS: T10- pelvis fusion. T12-L1 posterior column osteotomy 04/17/22  HISTORY OF PRESENT ILLNESS: Erin Petersen is approximately 2 weeks status post thoracopelvic fusion. she was doing very well and had decreased her oxycodone intake until a fall last night out of bed.  She has had some increased sharp pain in her right thoracic spine since.  She is otherwise doing well and denies any incisional concerns.  PHYSICAL EXAMINATION:  General: Patient is well developed, well nourished, calm, collected, and in no apparent distress.   NEUROLOGICAL:  General: In no acute distress.   Awake, alert, oriented to person, place, and time.  Pupils equal round and reactive to light.  Facial tone is symmetric.  Tongue protrusion is midline.  There is no pronator drift.   Strength:            Side Iliopsoas Quads Hamstring PF DF EHL  R '5 5 5 5 5 5  '$ L '5 5 5 5 5 5   '$ Incision c/d/I and healing well   ROS (Neurologic):  Negative except as noted above  IMAGING: No interval imaging to review   ASSESSMENT/PLAN:  Erin Petersen is doing fairly well approximately 2 weeks after spinal fusion.  He had some increased thoracic pain since a fall yesterday and would like x-rays to evaluate for hardware failure.  I have placed an order for these and will contact her via MyChart with the results once completed.  I have refilled her oxycodone.  We discussed how to appropriately continue wearing her TLSO brace.  We discussed activity escalation and I have advised the patient to lift up to 10 pounds until 6 weeks after surgery, then increase up to 25 pounds until 12 weeks after surgery.  After 12 weeks post-op, the patient advised to increase activity as tolerated. she will follow up 4 weeks   Advised to contact the office if any questions or concerns arise.  Cooper Render PA-C Department of neurosurgery

## 2022-05-02 NOTE — Telephone Encounter (Signed)
Erin Petersen from [facility] called in to report a fall. She stated that patient fell out of the bed trying to get to the bathroom around 1:00 AM.

## 2022-05-02 NOTE — Telephone Encounter (Signed)
Any injuries? Any follow up needed? Is she feeling ok? Thanks for the update

## 2022-05-02 NOTE — Telephone Encounter (Signed)
Aware, will watch for correspondence  

## 2022-05-04 DIAGNOSIS — M4324 Fusion of spine, thoracic region: Secondary | ICD-10-CM | POA: Diagnosis not present

## 2022-05-04 DIAGNOSIS — M960 Pseudarthrosis after fusion or arthrodesis: Secondary | ICD-10-CM | POA: Diagnosis not present

## 2022-05-04 DIAGNOSIS — Z981 Arthrodesis status: Secondary | ICD-10-CM | POA: Diagnosis not present

## 2022-05-04 DIAGNOSIS — M4854XD Collapsed vertebra, not elsewhere classified, thoracic region, subsequent encounter for fracture with routine healing: Secondary | ICD-10-CM | POA: Diagnosis not present

## 2022-05-04 DIAGNOSIS — Z96653 Presence of artificial knee joint, bilateral: Secondary | ICD-10-CM | POA: Diagnosis not present

## 2022-05-04 DIAGNOSIS — M532X4 Spinal instabilities, thoracic region: Secondary | ICD-10-CM | POA: Diagnosis not present

## 2022-05-04 DIAGNOSIS — M6281 Muscle weakness (generalized): Secondary | ICD-10-CM | POA: Diagnosis not present

## 2022-05-04 DIAGNOSIS — M4014 Other secondary kyphosis, thoracic region: Secondary | ICD-10-CM | POA: Diagnosis not present

## 2022-05-04 DIAGNOSIS — G4733 Obstructive sleep apnea (adult) (pediatric): Secondary | ICD-10-CM | POA: Diagnosis not present

## 2022-05-06 DIAGNOSIS — M4014 Other secondary kyphosis, thoracic region: Secondary | ICD-10-CM | POA: Diagnosis not present

## 2022-05-06 DIAGNOSIS — M4854XD Collapsed vertebra, not elsewhere classified, thoracic region, subsequent encounter for fracture with routine healing: Secondary | ICD-10-CM | POA: Diagnosis not present

## 2022-05-06 DIAGNOSIS — M532X4 Spinal instabilities, thoracic region: Secondary | ICD-10-CM | POA: Diagnosis not present

## 2022-05-06 DIAGNOSIS — Z96653 Presence of artificial knee joint, bilateral: Secondary | ICD-10-CM | POA: Diagnosis not present

## 2022-05-06 DIAGNOSIS — M6281 Muscle weakness (generalized): Secondary | ICD-10-CM | POA: Diagnosis not present

## 2022-05-06 DIAGNOSIS — G4733 Obstructive sleep apnea (adult) (pediatric): Secondary | ICD-10-CM | POA: Diagnosis not present

## 2022-05-06 DIAGNOSIS — M960 Pseudarthrosis after fusion or arthrodesis: Secondary | ICD-10-CM | POA: Diagnosis not present

## 2022-05-06 DIAGNOSIS — M4324 Fusion of spine, thoracic region: Secondary | ICD-10-CM | POA: Diagnosis not present

## 2022-05-06 DIAGNOSIS — Z981 Arthrodesis status: Secondary | ICD-10-CM | POA: Diagnosis not present

## 2022-05-08 DIAGNOSIS — M4324 Fusion of spine, thoracic region: Secondary | ICD-10-CM | POA: Diagnosis not present

## 2022-05-08 DIAGNOSIS — M960 Pseudarthrosis after fusion or arthrodesis: Secondary | ICD-10-CM | POA: Diagnosis not present

## 2022-05-08 DIAGNOSIS — M6281 Muscle weakness (generalized): Secondary | ICD-10-CM | POA: Diagnosis not present

## 2022-05-08 DIAGNOSIS — G4733 Obstructive sleep apnea (adult) (pediatric): Secondary | ICD-10-CM | POA: Diagnosis not present

## 2022-05-08 DIAGNOSIS — Z981 Arthrodesis status: Secondary | ICD-10-CM | POA: Diagnosis not present

## 2022-05-08 DIAGNOSIS — Z96653 Presence of artificial knee joint, bilateral: Secondary | ICD-10-CM | POA: Diagnosis not present

## 2022-05-08 DIAGNOSIS — M532X4 Spinal instabilities, thoracic region: Secondary | ICD-10-CM | POA: Diagnosis not present

## 2022-05-08 DIAGNOSIS — M4014 Other secondary kyphosis, thoracic region: Secondary | ICD-10-CM | POA: Diagnosis not present

## 2022-05-08 DIAGNOSIS — M4854XD Collapsed vertebra, not elsewhere classified, thoracic region, subsequent encounter for fracture with routine healing: Secondary | ICD-10-CM | POA: Diagnosis not present

## 2022-05-09 DIAGNOSIS — Z981 Arthrodesis status: Secondary | ICD-10-CM | POA: Diagnosis not present

## 2022-05-09 DIAGNOSIS — M532X4 Spinal instabilities, thoracic region: Secondary | ICD-10-CM | POA: Diagnosis not present

## 2022-05-09 DIAGNOSIS — G4733 Obstructive sleep apnea (adult) (pediatric): Secondary | ICD-10-CM | POA: Diagnosis not present

## 2022-05-09 DIAGNOSIS — M960 Pseudarthrosis after fusion or arthrodesis: Secondary | ICD-10-CM | POA: Diagnosis not present

## 2022-05-09 DIAGNOSIS — M4854XD Collapsed vertebra, not elsewhere classified, thoracic region, subsequent encounter for fracture with routine healing: Secondary | ICD-10-CM | POA: Diagnosis not present

## 2022-05-09 DIAGNOSIS — Z96653 Presence of artificial knee joint, bilateral: Secondary | ICD-10-CM | POA: Diagnosis not present

## 2022-05-09 DIAGNOSIS — M4014 Other secondary kyphosis, thoracic region: Secondary | ICD-10-CM | POA: Diagnosis not present

## 2022-05-09 DIAGNOSIS — M4324 Fusion of spine, thoracic region: Secondary | ICD-10-CM | POA: Diagnosis not present

## 2022-05-09 DIAGNOSIS — M6281 Muscle weakness (generalized): Secondary | ICD-10-CM | POA: Diagnosis not present

## 2022-05-10 ENCOUNTER — Other Ambulatory Visit: Payer: Self-pay

## 2022-05-10 DIAGNOSIS — M960 Pseudarthrosis after fusion or arthrodesis: Secondary | ICD-10-CM | POA: Diagnosis not present

## 2022-05-10 DIAGNOSIS — M4324 Fusion of spine, thoracic region: Secondary | ICD-10-CM | POA: Diagnosis not present

## 2022-05-10 DIAGNOSIS — M4854XD Collapsed vertebra, not elsewhere classified, thoracic region, subsequent encounter for fracture with routine healing: Secondary | ICD-10-CM | POA: Diagnosis not present

## 2022-05-10 DIAGNOSIS — R0602 Shortness of breath: Secondary | ICD-10-CM

## 2022-05-10 DIAGNOSIS — Z981 Arthrodesis status: Secondary | ICD-10-CM | POA: Diagnosis not present

## 2022-05-10 DIAGNOSIS — G4733 Obstructive sleep apnea (adult) (pediatric): Secondary | ICD-10-CM | POA: Diagnosis not present

## 2022-05-10 DIAGNOSIS — Z96653 Presence of artificial knee joint, bilateral: Secondary | ICD-10-CM | POA: Diagnosis not present

## 2022-05-10 DIAGNOSIS — M6281 Muscle weakness (generalized): Secondary | ICD-10-CM | POA: Diagnosis not present

## 2022-05-10 DIAGNOSIS — M4014 Other secondary kyphosis, thoracic region: Secondary | ICD-10-CM | POA: Diagnosis not present

## 2022-05-10 DIAGNOSIS — M532X4 Spinal instabilities, thoracic region: Secondary | ICD-10-CM | POA: Diagnosis not present

## 2022-05-11 ENCOUNTER — Telehealth: Payer: Self-pay | Admitting: Internal Medicine

## 2022-05-11 ENCOUNTER — Telehealth: Payer: Self-pay | Admitting: Neurosurgery

## 2022-05-11 DIAGNOSIS — M4324 Fusion of spine, thoracic region: Secondary | ICD-10-CM | POA: Diagnosis not present

## 2022-05-11 DIAGNOSIS — M4325 Fusion of spine, thoracolumbar region: Secondary | ICD-10-CM

## 2022-05-11 DIAGNOSIS — M532X4 Spinal instabilities, thoracic region: Secondary | ICD-10-CM | POA: Diagnosis not present

## 2022-05-11 DIAGNOSIS — Z96653 Presence of artificial knee joint, bilateral: Secondary | ICD-10-CM | POA: Diagnosis not present

## 2022-05-11 DIAGNOSIS — M4014 Other secondary kyphosis, thoracic region: Secondary | ICD-10-CM | POA: Diagnosis not present

## 2022-05-11 DIAGNOSIS — M960 Pseudarthrosis after fusion or arthrodesis: Secondary | ICD-10-CM | POA: Diagnosis not present

## 2022-05-11 DIAGNOSIS — G4733 Obstructive sleep apnea (adult) (pediatric): Secondary | ICD-10-CM | POA: Diagnosis not present

## 2022-05-11 DIAGNOSIS — M6281 Muscle weakness (generalized): Secondary | ICD-10-CM | POA: Diagnosis not present

## 2022-05-11 DIAGNOSIS — Z981 Arthrodesis status: Secondary | ICD-10-CM | POA: Diagnosis not present

## 2022-05-11 DIAGNOSIS — M4854XD Collapsed vertebra, not elsewhere classified, thoracic region, subsequent encounter for fracture with routine healing: Secondary | ICD-10-CM | POA: Diagnosis not present

## 2022-05-11 MED ORDER — OXYCODONE HCL 5 MG PO TABS: 5.0000 mg | ORAL_TABLET | Freq: Four times a day (QID) | ORAL | 0 refills | Status: AC | PRN

## 2022-05-11 NOTE — Telephone Encounter (Signed)
Left vm and sent mychart message to confirm 05/17/22 appointment-Toni

## 2022-05-11 NOTE — Telephone Encounter (Signed)
Patient confirmed

## 2022-05-11 NOTE — Telephone Encounter (Signed)
Patient called the office this afternoon requesting a refill of oxycodone '5mg'$  to be sent to the [facility] on [address].  If you have any issues or need more information, please contact the patient at [phone].

## 2022-05-11 NOTE — Telephone Encounter (Signed)
DOS: T10- pelvis fusion. T12-L1 posterior column osteotomy 04/17/22   PMP reviewed and is appropriate.   Refill of oxycodone sent to her pharmacy. Please let her know.

## 2022-05-15 DIAGNOSIS — G4733 Obstructive sleep apnea (adult) (pediatric): Secondary | ICD-10-CM | POA: Diagnosis not present

## 2022-05-15 DIAGNOSIS — Z981 Arthrodesis status: Secondary | ICD-10-CM | POA: Diagnosis not present

## 2022-05-15 DIAGNOSIS — M4014 Other secondary kyphosis, thoracic region: Secondary | ICD-10-CM | POA: Diagnosis not present

## 2022-05-15 DIAGNOSIS — M960 Pseudarthrosis after fusion or arthrodesis: Secondary | ICD-10-CM | POA: Diagnosis not present

## 2022-05-15 DIAGNOSIS — M4854XD Collapsed vertebra, not elsewhere classified, thoracic region, subsequent encounter for fracture with routine healing: Secondary | ICD-10-CM | POA: Diagnosis not present

## 2022-05-15 DIAGNOSIS — Z96653 Presence of artificial knee joint, bilateral: Secondary | ICD-10-CM | POA: Diagnosis not present

## 2022-05-15 DIAGNOSIS — M4324 Fusion of spine, thoracic region: Secondary | ICD-10-CM | POA: Diagnosis not present

## 2022-05-15 DIAGNOSIS — M532X4 Spinal instabilities, thoracic region: Secondary | ICD-10-CM | POA: Diagnosis not present

## 2022-05-15 DIAGNOSIS — M6281 Muscle weakness (generalized): Secondary | ICD-10-CM | POA: Diagnosis not present

## 2022-05-16 DIAGNOSIS — M6281 Muscle weakness (generalized): Secondary | ICD-10-CM | POA: Diagnosis not present

## 2022-05-16 DIAGNOSIS — Z981 Arthrodesis status: Secondary | ICD-10-CM | POA: Diagnosis not present

## 2022-05-16 DIAGNOSIS — M4014 Other secondary kyphosis, thoracic region: Secondary | ICD-10-CM | POA: Diagnosis not present

## 2022-05-16 DIAGNOSIS — M960 Pseudarthrosis after fusion or arthrodesis: Secondary | ICD-10-CM | POA: Diagnosis not present

## 2022-05-16 DIAGNOSIS — Z96653 Presence of artificial knee joint, bilateral: Secondary | ICD-10-CM | POA: Diagnosis not present

## 2022-05-16 DIAGNOSIS — M4854XD Collapsed vertebra, not elsewhere classified, thoracic region, subsequent encounter for fracture with routine healing: Secondary | ICD-10-CM | POA: Diagnosis not present

## 2022-05-16 DIAGNOSIS — M4324 Fusion of spine, thoracic region: Secondary | ICD-10-CM | POA: Diagnosis not present

## 2022-05-16 DIAGNOSIS — G4733 Obstructive sleep apnea (adult) (pediatric): Secondary | ICD-10-CM | POA: Diagnosis not present

## 2022-05-16 DIAGNOSIS — M532X4 Spinal instabilities, thoracic region: Secondary | ICD-10-CM | POA: Diagnosis not present

## 2022-05-17 ENCOUNTER — Telehealth: Payer: Self-pay | Admitting: Internal Medicine

## 2022-05-17 ENCOUNTER — Ambulatory Visit: Payer: Medicare Other | Admitting: Internal Medicine

## 2022-05-17 NOTE — Telephone Encounter (Signed)
Pt called to conf 05/29 appt-nm

## 2022-05-18 DIAGNOSIS — M960 Pseudarthrosis after fusion or arthrodesis: Secondary | ICD-10-CM | POA: Diagnosis not present

## 2022-05-18 DIAGNOSIS — Z96653 Presence of artificial knee joint, bilateral: Secondary | ICD-10-CM | POA: Diagnosis not present

## 2022-05-18 DIAGNOSIS — M6281 Muscle weakness (generalized): Secondary | ICD-10-CM | POA: Diagnosis not present

## 2022-05-18 DIAGNOSIS — M4854XD Collapsed vertebra, not elsewhere classified, thoracic region, subsequent encounter for fracture with routine healing: Secondary | ICD-10-CM | POA: Diagnosis not present

## 2022-05-18 DIAGNOSIS — Z981 Arthrodesis status: Secondary | ICD-10-CM | POA: Diagnosis not present

## 2022-05-18 DIAGNOSIS — G4733 Obstructive sleep apnea (adult) (pediatric): Secondary | ICD-10-CM | POA: Diagnosis not present

## 2022-05-18 DIAGNOSIS — M532X4 Spinal instabilities, thoracic region: Secondary | ICD-10-CM | POA: Diagnosis not present

## 2022-05-18 DIAGNOSIS — M4014 Other secondary kyphosis, thoracic region: Secondary | ICD-10-CM | POA: Diagnosis not present

## 2022-05-18 DIAGNOSIS — M4324 Fusion of spine, thoracic region: Secondary | ICD-10-CM | POA: Diagnosis not present

## 2022-05-19 DIAGNOSIS — M4014 Other secondary kyphosis, thoracic region: Secondary | ICD-10-CM | POA: Diagnosis not present

## 2022-05-19 DIAGNOSIS — Z981 Arthrodesis status: Secondary | ICD-10-CM | POA: Diagnosis not present

## 2022-05-19 DIAGNOSIS — M960 Pseudarthrosis after fusion or arthrodesis: Secondary | ICD-10-CM | POA: Diagnosis not present

## 2022-05-19 DIAGNOSIS — M4324 Fusion of spine, thoracic region: Secondary | ICD-10-CM | POA: Diagnosis not present

## 2022-05-19 DIAGNOSIS — G4733 Obstructive sleep apnea (adult) (pediatric): Secondary | ICD-10-CM | POA: Diagnosis not present

## 2022-05-19 DIAGNOSIS — M532X4 Spinal instabilities, thoracic region: Secondary | ICD-10-CM | POA: Diagnosis not present

## 2022-05-19 DIAGNOSIS — Z96653 Presence of artificial knee joint, bilateral: Secondary | ICD-10-CM | POA: Diagnosis not present

## 2022-05-19 DIAGNOSIS — M6281 Muscle weakness (generalized): Secondary | ICD-10-CM | POA: Diagnosis not present

## 2022-05-19 DIAGNOSIS — M4854XD Collapsed vertebra, not elsewhere classified, thoracic region, subsequent encounter for fracture with routine healing: Secondary | ICD-10-CM | POA: Diagnosis not present

## 2022-05-20 ENCOUNTER — Other Ambulatory Visit: Payer: Self-pay | Admitting: Neurosurgery

## 2022-05-23 DIAGNOSIS — M6281 Muscle weakness (generalized): Secondary | ICD-10-CM | POA: Diagnosis not present

## 2022-05-23 DIAGNOSIS — Z981 Arthrodesis status: Secondary | ICD-10-CM | POA: Diagnosis not present

## 2022-05-23 DIAGNOSIS — Z96653 Presence of artificial knee joint, bilateral: Secondary | ICD-10-CM | POA: Diagnosis not present

## 2022-05-23 DIAGNOSIS — M532X4 Spinal instabilities, thoracic region: Secondary | ICD-10-CM | POA: Diagnosis not present

## 2022-05-23 DIAGNOSIS — G4733 Obstructive sleep apnea (adult) (pediatric): Secondary | ICD-10-CM | POA: Diagnosis not present

## 2022-05-23 DIAGNOSIS — M4854XD Collapsed vertebra, not elsewhere classified, thoracic region, subsequent encounter for fracture with routine healing: Secondary | ICD-10-CM | POA: Diagnosis not present

## 2022-05-23 DIAGNOSIS — M960 Pseudarthrosis after fusion or arthrodesis: Secondary | ICD-10-CM | POA: Diagnosis not present

## 2022-05-23 DIAGNOSIS — M4014 Other secondary kyphosis, thoracic region: Secondary | ICD-10-CM | POA: Diagnosis not present

## 2022-05-23 DIAGNOSIS — M4324 Fusion of spine, thoracic region: Secondary | ICD-10-CM | POA: Diagnosis not present

## 2022-05-25 ENCOUNTER — Encounter: Payer: Self-pay | Admitting: Neurosurgery

## 2022-05-25 DIAGNOSIS — Z96653 Presence of artificial knee joint, bilateral: Secondary | ICD-10-CM | POA: Diagnosis not present

## 2022-05-25 DIAGNOSIS — M4014 Other secondary kyphosis, thoracic region: Secondary | ICD-10-CM | POA: Diagnosis not present

## 2022-05-25 DIAGNOSIS — M532X4 Spinal instabilities, thoracic region: Secondary | ICD-10-CM | POA: Diagnosis not present

## 2022-05-25 DIAGNOSIS — M6281 Muscle weakness (generalized): Secondary | ICD-10-CM | POA: Diagnosis not present

## 2022-05-25 DIAGNOSIS — M4324 Fusion of spine, thoracic region: Secondary | ICD-10-CM | POA: Diagnosis not present

## 2022-05-25 DIAGNOSIS — G4733 Obstructive sleep apnea (adult) (pediatric): Secondary | ICD-10-CM | POA: Diagnosis not present

## 2022-05-25 DIAGNOSIS — M4854XD Collapsed vertebra, not elsewhere classified, thoracic region, subsequent encounter for fracture with routine healing: Secondary | ICD-10-CM | POA: Diagnosis not present

## 2022-05-25 DIAGNOSIS — Z981 Arthrodesis status: Secondary | ICD-10-CM | POA: Diagnosis not present

## 2022-05-25 DIAGNOSIS — M960 Pseudarthrosis after fusion or arthrodesis: Secondary | ICD-10-CM | POA: Diagnosis not present

## 2022-05-26 NOTE — Telephone Encounter (Signed)
Per further discussion with Colletta Maryland, the hospital bill was paid. The denial is on the professional side. I have sent a message to [employer] in billing.

## 2022-05-29 DIAGNOSIS — R4189 Other symptoms and signs involving cognitive functions and awareness: Secondary | ICD-10-CM | POA: Diagnosis not present

## 2022-05-29 DIAGNOSIS — G2581 Restless legs syndrome: Secondary | ICD-10-CM | POA: Diagnosis not present

## 2022-05-29 DIAGNOSIS — Z9889 Other specified postprocedural states: Secondary | ICD-10-CM | POA: Diagnosis not present

## 2022-05-29 DIAGNOSIS — G629 Polyneuropathy, unspecified: Secondary | ICD-10-CM | POA: Diagnosis not present

## 2022-05-29 DIAGNOSIS — M792 Neuralgia and neuritis, unspecified: Secondary | ICD-10-CM | POA: Diagnosis not present

## 2022-05-31 ENCOUNTER — Telehealth: Payer: Self-pay

## 2022-05-31 NOTE — Progress Notes (Signed)
Care Management & Coordination Services Pharmacy Team  Reason for Encounter: Appointment Reminder  Contacted patient to confirm telephone appointment with Charlene Brooke , PharmD on 06/05/22 at 10:15. Spoke with patient on 05/31/2022   Do you have any problems getting your medications? No  Patient has no concerns with mail order pharmacy   What is your top health concern you would like to discuss at your upcoming visit?  Patient reports healing from back surgery and doing well.  Have you seen any other providers since your last visit with PCP? Yes -Neurosurgery   Chart review:  Recent office visits:  01/31/22-Marne Tower,MD(PCP)-abdominal wall bulge,labs(look good) referral for CT 12/28/21-Marne Tower,MD(PCP)-post op pain, increase miralax to TID,using oxycodone and hydrocodone,advised to wean,recommend earlier appt with neurosurgeon.  Recent consult visits:  02/16/22-Rohini Vanga,MD(gastro)-f/u constipation, worsening, discussed cleanout with golytely ad 1 day of clear liquid diet, advised to take miralax 34g daily f/u 6 months 01/08/22-[facility]- URI-start ondansetron 4mg  and benzonatate 200mg . 01/03/22-Rohini Vanga,MD)gastro)- iron deficiency,labs(she has severe iron deficiency anemia.  Recommend fusion plus once a day for 3 months, recheck CBC and iron panel in 1 month) 12/30/22-Alyssa Abernathy,NP(pulmo)-f/u OSA,f/u 6 months  Hospital visits:  Medication Reconciliation was completed by comparing discharge summary, patient's EMR and Pharmacy list, and upon discussion with patient.  Admitted to the hospital on 04/17/22 due to Spinal Fusion Procedure . Discharge date was 04/24/22. Discharged from [facility].    New?Medications Started at [facility] Discharge:?? -started  bisacodyl              Celebrex   oxycodone  Medication Changes at Hospital Discharge: -Changed methocarbamol  Medications that remain the same after Hospital Discharge:??  -All other medications will remain the  same.    12/14/21 thru 12/17/21-[facility]- Spinal revision T12-S1 . 12/01/21- [facility]- lumbar pain- discharged home   Star Rating Drugs:  Medication:  Last Fill: Day Supply Simvastatin 20mg  03/26/22 90   Care Gaps: Annual wellness visit in last year? Yes   Charlene Brooke, PharmD notified  Avel Sensor, [place] Assistant [phone]

## 2022-06-01 ENCOUNTER — Encounter: Payer: Self-pay | Admitting: Neurosurgery

## 2022-06-02 ENCOUNTER — Other Ambulatory Visit: Payer: Self-pay

## 2022-06-02 DIAGNOSIS — G4733 Obstructive sleep apnea (adult) (pediatric): Secondary | ICD-10-CM | POA: Diagnosis not present

## 2022-06-02 DIAGNOSIS — M6281 Muscle weakness (generalized): Secondary | ICD-10-CM | POA: Diagnosis not present

## 2022-06-02 DIAGNOSIS — M4324 Fusion of spine, thoracic region: Secondary | ICD-10-CM | POA: Diagnosis not present

## 2022-06-02 DIAGNOSIS — M4854XD Collapsed vertebra, not elsewhere classified, thoracic region, subsequent encounter for fracture with routine healing: Secondary | ICD-10-CM | POA: Diagnosis not present

## 2022-06-02 DIAGNOSIS — M532X4 Spinal instabilities, thoracic region: Secondary | ICD-10-CM | POA: Diagnosis not present

## 2022-06-02 DIAGNOSIS — M960 Pseudarthrosis after fusion or arthrodesis: Secondary | ICD-10-CM | POA: Diagnosis not present

## 2022-06-02 DIAGNOSIS — M4014 Other secondary kyphosis, thoracic region: Secondary | ICD-10-CM | POA: Diagnosis not present

## 2022-06-02 DIAGNOSIS — Z96653 Presence of artificial knee joint, bilateral: Secondary | ICD-10-CM | POA: Diagnosis not present

## 2022-06-02 DIAGNOSIS — Z981 Arthrodesis status: Secondary | ICD-10-CM | POA: Diagnosis not present

## 2022-06-05 ENCOUNTER — Encounter: Payer: Self-pay | Admitting: Gastroenterology

## 2022-06-05 ENCOUNTER — Ambulatory Visit: Payer: Medicare Other | Admitting: Pharmacist

## 2022-06-05 DIAGNOSIS — D649 Anemia, unspecified: Secondary | ICD-10-CM

## 2022-06-05 NOTE — Patient Instructions (Signed)
Visit Information  Phone number for Pharmacist: [phone]  Thank you for meeting with me to discuss your medications! Below is a summary of what we talked about during the visit:   Recommendations/Changes made from today's visit: -Advised patient to contact GI about repeating iron studies, as previously recommended; pt may require iron infusions  Follow up plan: -Pharmacist follow up televisit scheduled for 3 months -Neurosurgery appts 06/06/22, 07/06/22   Al Corpus, PharmD, BCACP Clinical Pharmacist [facility] [phone]

## 2022-06-05 NOTE — Progress Notes (Signed)
Care Management & Coordination Services Pharmacy Note  06/05/2022 Name:  Erin Petersen MRN:  [id number] DOB:  [birth year]  Summary: F/U visit -s/p spinal fusion: pt is off opioids and undergoing PT; she reports shortness of breath with mild exertion -Iron deficiency anemia: HGB 8.6 (04/20/22), TSAT 6 (12/2021), pt had blood transfusion during admission for back surgery 04/18/22; pt was not able to tolerate Fusion plus iron pill and has not followed up with GI for IDA yet (other issues have deferred this); discussed SOB, fatigue, and RLS are all manifestations of iron deficiency  Recommendations/Changes made from today's visit: -Advised patient to contact GI about repeating iron studies, as previously recommended; pt may require iron infusions  Follow up plan: -Pharmacist follow up televisit scheduled for 3 months -Neurosurgery appts 06/06/22, 07/06/22     Subjective: Erin Petersen is an 82 y.o. year old female who is a primary patient of Tower, Audrie Gallus, MD.  The care coordination team was consulted for assistance with disease management and care coordination needs.    Engaged with patient by telephone for follow up visit.  Recent office visits: 01/31/22 Dr Milinda Antis OV: abdominal bulge - CT negative for hernia. F/u GI  Recent consult visits: 05/29/22 Dr Sherryll Burger (Neurology): f/u neuropathy, could consider changing amitriptyline to duloxetine.   05/02/22 PA Alycia Rossetti (Neurosurgery): s/p final fusion (2 weeks) - refilled Oxycodone  03/30/22 Dr Myer Haff (Neurosurgery): plan spinal fusion  02/16/22 Dr Allegra Lai (GI): chronic constipation - worsening in s/o restricted mobility, recent spinal surgery; rx cleanout - Golytely x 1 day, then miralax 34 g daily  01/03/22 Dr Allegra Lai (GI): IDA - TSAT 6. Rec Fusion plus x 3 months then recheck.  12/29/21 NP Abernathy (Pulm): OSA, continue CPAP.  10/07/21 Dr Gwen Pounds (Cardiology): cleared for surgery.  Hospital visits: 04/17/22 - 04/24/22 planned admission [facility]): spinal  fusion. Start Dulcolax, oxycodone, celocoxib.   Objective:  Lab Results  Component Value Date   CREATININE 0.70 04/06/2022   BUN 32 (H) 04/06/2022   GFR 80.34 01/31/2022   GFRNONAA >60 04/06/2022   GFRAA >60 06/07/2019   NA 132 (L) 04/06/2022   K 3.3 (L) 04/06/2022   CALCIUM 10.1 04/06/2022   CO2 30 04/06/2022   GLUCOSE 103 (H) 04/06/2022    Lab Results  Component Value Date/Time   HGBA1C 6.3 01/31/2022 11:13 AM   HGBA1C 6.3 (A) 08/04/2021 09:07 AM   HGBA1C 6.2 (A) 11/17/2020 09:50 AM   HGBA1C 6.7 (H) 08/10/2020 08:17 AM   GFR 80.34 01/31/2022 11:13 AM   GFR 45.99 (L) 08/10/2020 08:17 AM    Last diabetic Eye exam:  Lab Results  Component Value Date/Time   HMDIABEYEEXA No Retinopathy 11/04/2020 12:00 AM    Last diabetic Foot exam: No results found for: "HMDIABFOOTEX"   Lab Results  Component Value Date   CHOL 163 08/10/2020   HDL 36.20 (L) 08/10/2020   LDLCALC 67 03/12/2020   LDLDIRECT 90.0 08/10/2020   TRIG 368.0 (H) 08/10/2020   CHOLHDL 5 08/10/2020       Latest Ref Rng & Units 10/01/2020    9:10 AM 08/10/2020    8:17 AM 02/13/2020   11:55 AM  Hepatic Function  Total Protein 6.5 - 8.1 g/dL 7.2  6.8  7.4   Albumin 3.5 - 5.0 g/dL 4.4  4.4  3.9   AST 15 - 41 U/L 25  14  9    ALT 0 - 44 U/L 30  18  9    Alk Phosphatase 38 - 126  U/L 67  73  120   Total Bilirubin 0.3 - 1.2 mg/dL 0.4  0.2  0.2     Lab Results  Component Value Date/Time   TSH 1.51 08/10/2020 08:17 AM       Latest Ref Rng & Units 04/20/2022    2:51 AM 04/19/2022    6:05 AM 04/18/2022    7:19 AM  CBC  WBC 4.0 - 10.5 K/uL 8.0  11.6  12.9   Hemoglobin 12.0 - 15.0 g/dL 8.6  9.0  6.4   Hematocrit 36.0 - 46.0 % 26.6  27.2  20.5   Platelets 150 - 400 K/uL 240  273  324    Iron/TIBC/Ferritin/ %Sat    Component Value Date/Time   IRON 33 (L) 01/31/2022 1113   IRON 21 (L) 01/03/2022 1400   TIBC 345 01/03/2022 1400   FERRITIN 42 01/03/2022 1400   IRONPCTSAT 6 (LL) 01/03/2022 1400    Lab  Results  Component Value Date/Time   VD25OH 37.9 01/03/2022 02:00 PM   VD25OH 31.20 08/10/2020 08:17 AM   VD25OH 33.46 04/21/2019 11:27 AM   VITAMINB12 570 01/03/2022 02:00 PM   VITAMINB12 457 04/22/2019 01:46 PM    Clinical ASCVD: Yes  The ASCVD Risk score (Arnett DK, et al., 2019) failed to calculate for the following reasons:   The 2019 ASCVD risk score is only valid for ages 6740 to 1979       08/26/2021   12:41 PM 09/10/2020   11:20 AM 08/25/2020    9:34 AM  Depression screen PHQ 2/9  Decreased Interest 0 0 0  Down, Depressed, Hopeless 0 0 0  PHQ - 2 Score 0 0 0     Social History   Tobacco Use  Smoking Status Never  Smokeless Tobacco Never   BP Readings from Last 3 Encounters:  05/02/22 130/62  04/24/22 (!) 147/67  04/06/22 (!) 156/70   Pulse Readings from Last 3 Encounters:  04/24/22 74  04/06/22 69  03/16/22 75   Wt Readings from Last 3 Encounters:  05/02/22 167 lb (75.8 kg)  04/17/22 167 lb 5.3 oz (75.9 kg)  04/06/22 167 lb 5.3 oz (75.9 kg)   BMI Readings from Last 3 Encounters:  05/02/22 34.90 kg/m  04/17/22 34.97 kg/m  04/06/22 34.97 kg/m    Allergies  Allergen Reactions   Bee Venom Anaphylaxis and Hives   Fosamax [Alendronate] Nausea Only   Fusion Plus [Iron-Fa-B Cmp-C-Biot-Probiotic] Nausea Only   Ciprofloxacin Hcl Rash    Mild rash after long term use   Sulfa Antibiotics Rash    Bactrim    Medications Reviewed Today     Reviewed by Aretha Parroturrie, Emily, CMA (Certified Medical Assistant) on 05/02/22 at 1335  Med List Status: <None>   Medication Order Taking? Sig Documenting Provider Last Dose Status Informant  Acetaminophen (TYLENOL PO) [id number] Yes Take 650 mg by mouth 5 (five) times daily. [provider] Taking Active Self  albuterol (VENTOLIN HFA) 108 (90 Base) MCG/ACT inhaler [id number] Yes Inhale 2 puffs into the lungs every 6 (six) hours as needed for wheezing or shortness of breath. Carlean JewsMcDonough, Lauren K, PA-C Taking Active    amitriptyline (ELAVIL) 25 MG tablet [id number] Yes TAKE 1 TABLET AT BEDTIME Tower, Audrie GallusMarne A, MD Taking Active   amLODipine (NORVASC) 5 MG tablet [id number] Yes Take 1 tablet (5 mg total) by mouth daily. Tower, Audrie GallusMarne A, MD Taking Active   Ascorbic Acid (VITAMIN C) 1000 MG tablet [id number] Yes Take 750 mg by mouth  daily. [provider] Taking Active Self  aspirin 81 MG EC tablet [id number] Yes Take 81 mg by mouth at bedtime.  [provider] Taking Active Self  bisacodyl (DULCOLAX) 5 MG EC tablet [id number] Yes Take 1-2 tablets (5-10 mg total) by mouth daily as needed for moderate constipation. Venetia Night, MD Taking Active   calcium carbonate (TUMS EX) 750 MG chewable tablet [id number] Yes Chew 3 tablets by mouth daily. [provider] Taking Active Self  celecoxib (CELEBREX) 200 MG capsule [id number] Yes Take 1 capsule (200 mg total) by mouth 2 (two) times daily. Venetia Night, MD Taking Active   Cholecalciferol (VITAMIN D3) 50 MCG (2000 UT) TABS [id number] Yes Take 2,000 Units by mouth daily. [provider] Taking Active Self  Cyanocobalamin (VITAMIN B-12) 1000 MCG SUBL [id number] Yes Take 2,000 mcg by mouth daily. [provider] Taking Active Self  EPINEPHrine 0.3 mg/0.3 mL IJ SOAJ injection [id number] Yes Inject 0.3 mg into the muscle as needed for anaphylaxis. [provider] Taking Active   fluticasone (FLONASE) 50 MCG/ACT nasal spray [id number] Yes Place 2 sprays into both nostrils daily. qhs [provider] Taking Active Self  hydrochlorothiazide (HYDRODIURIL) 25 MG tablet [id number] Yes TAKE 1 TABLET DAILY Tower, Audrie Gallus, MD Taking Active   Melatonin 10 MG CAPS [id number] Yes Take 1 capsule by mouth as needed. [provider] Taking Active Self  Discontinued 05/02/22 1335 (Duplicate) methocarbamol (ROBAXIN) 500 MG tablet [id number] Yes Take 1 tablet (500 mg total) by mouth every 6 (six) hours as needed for muscle spasms.  Venetia Night, MD Taking Active   omeprazole (PRILOSEC) 40 MG capsule [id number] Yes Take 1 capsule (40 mg total) by mouth daily before breakfast.  Patient taking differently: Take 40 mg by mouth in the morning and at bedtime.   Toney Reil, MD Taking Active   ondansetron (ZOFRAN-ODT) 4 MG disintegrating tablet [id number] Yes Take 1 tablet (4 mg total) by mouth every 8 (eight) hours as needed for nausea. Tower, Audrie Gallus, MD Taking Active   OVER THE COUNTER MEDICATION [id number] Yes 750 mg. Calcium with Vit D and K [provider] Taking Active   polyethylene glycol (MIRALAX / GLYCOLAX) 17 g packet [id number] Yes Take 1 packet by mouth daily.  [provider] Taking Active Self  pregabalin (LYRICA) 300 MG capsule [id number] Yes Take 300 mg by mouth 2 (two) times daily. 150 mg in am and 300 mg at bedtime [provider] Taking Active Self  rOPINIRole (REQUIP) 1 MG tablet [id number] Yes Take 1 mg by mouth in the morning and at bedtime. 1 in am and 1 at 3pm [provider] Taking Active Self  rOPINIRole (REQUIP) 4 MG tablet [id number] Yes TAKE 1 TABLET AT BEDTIME Tower, Audrie Gallus, MD Taking Active   simvastatin (ZOCOR) 20 MG tablet [id number] Yes TAKE 1 TABLET AT BEDTIME Tower, Audrie Gallus, MD Taking Active             SDOH:  (Social Determinants of Health) assessments and interventions performed: No SDOH Interventions    Flowsheet Row Telephone from 04/25/2022 in [facility] Care Coordination Admission (Discharged) from 04/17/2022 in [facility] ([facility] Clinical Support from 08/26/2021 in [facility] at [facility] Chronic Care Management from 02/04/2021 in [facility] Clinical Support from 04/17/2019 in [facility] [place]  SDOH Interventions       Food Insecurity Interventions Intervention Not  Indicated -- Intervention Not Indicated  -- --  Housing Interventions Intervention Not Indicated Intervention Not Indicated Intervention Not Indicated -- --  Transportation Interventions Intervention Not Indicated -- Intervention Not Indicated -- --  Depression Interventions/Treatment  -- -- -- -- PHQ2-9 Score <4 Follow-up Not Indicated  Financial Strain Interventions -- -- Intervention Not Indicated Intervention Not Indicated --  Physical Activity Interventions -- -- Intervention Not Indicated -- --  Stress Interventions -- -- Intervention Not Indicated -- --  Social Connections Interventions -- -- Intervention Not Indicated -- --       Medication Assistance: None required.  Patient affirms current coverage meets needs.  Medication Access: Within the past 30 days, how often has patient missed a dose of medication? 0 Is a pillbox or other method used to improve adherence? Yes  Factors that may affect medication adherence? no barriers identified Are meds synced by current pharmacy? No  Are meds delivered by current pharmacy? Yes  Does patient experience delays in picking up medications due to transportation concerns? No   [facility] Services Reviewed: Is patient disadvantaged to use [facility]?: Yes  Current Rx insurance plan: BCBS Name and location of Current pharmacy:  [facility] [phone] [place], [place] - [address] [place] [address] Phone: [phone] Fax: [phone]  [facility] - [place], [place] - [address] [place] [address] Phone: [phone] Fax: [phone]  [facility] services reviewed with patient today?: No  Patient requests to transfer care to [facility]?: No  Reason patient declined to change pharmacies: Disadvantaged due to insurance/mail order  Compliance/Adherence/Medication fill history: Care Gaps: UACR (never done) Foot exam (due 07/2021) Mammogram (due 08/2021) Eye exam (due  10/2021)  Star-Rating Drugs: Simvastatin - PDC 75% (LF 03/26/22 x 90 ds)    ASSESSMENT / PLAN  Hypertension (BP goal <140/90) -Controlled - BP at goal in recent OV -Hx OSA; on CPAP,  -Current home readings: n/a -Current treatment: Amlodipine 5 mg daily - Appropriate, Effective, Safe, Accessible HCTZ 25 mg daily - Appropriate, Effective, Safe, Accessible -Denies hypotensive/hypertensive symptoms -Educated on BP goals and benefits of medications for prevention of heart attack, stroke and kidney damage; Daily salt intake goal < 2300 mg; Importance of home blood pressure monitoring; -Counseled to monitor BP at home periodically -Recommended to continue current medication  Hyperlipidemia: (LDL goal < 100) -Controlled - LDL 90 (07/2020) at goal but did worsen from 67 to 90 between Jan and June 2022; pt endorses compliance with statin -Hx TIA, resolved w/in 2 hours -Current treatment: Simvastatin 20 mg daily HS - Appropriate, Effective, Safe, Accessible Aspirin 81 mg daily HS -Appropriate, Effective, Safe, Accessible -Educated on Cholesterol goals; Benefits of statin for ASCVD risk reduction; Importance of limiting foods high in cholesterol; Exercise goal of 150 minutes per week; -Recommended to continue current medication  Osteopenia (Goal prevent fractures) -Not ideally controlled - pt was unable to tolerate alendronate and Prolia was too expensive in 2021; -Last DEXA Scan: 01/21/19   T-Score femoral neck: -1.7  T-Score forearm radius: -1.9  10-year probability of major osteoporotic fracture: 30.2%  10-year probability of hip fracture: 16.3% -Patient is a candidate for pharmacologic treatment due to T-Score -1.0 to -2.5 and 10-year risk of major osteoporotic fracture > 20% and T-Score -1.0 to -2.5 and 10-year risk of hip fracture > 3% -Current treatment  Vitamin D 5000 IU daily -Appropriate, Effective, Safe, Accessible -Medications previously tried: alendronate (2021-2022, nausea);  Prolia  was not covered in 2021, early 2022 was checking on coverage again, unclear results -Recommend 1200 mg of calcium daily from dietary and supplemental sources. Recommend weight-bearing and muscle strengthening exercises for building and maintaining bone density.  Chronic pain (Goal: manage pain) -Controlled - s/p back surgery Fall 2023 and Feb 2024; off opioids now -Follows with neurosurgery -Hx osteoarthritis (s/p TKR 09/2020), lumbar disc disorder, neuropathy; -Current treatment  Pregabalin 150 mg - 1 tab AM, 2 tab PM -Appropriate, Effective, Safe, Accessible Amitriptyline 25 mg HS (IC) -Appropriate, Effective, Safe, Accessible Celecoxib 200 mg BID - Appropriate, Effective, Safe, Accessible Methocarbamol 500 mg - Appropriate, Effective, Safe, Accessible Tylenol 650 mg PRN -Medications previously tried/failed: meloxicam, ibuprofen, oxycodone, tizanidine -Counseled on additive sedative effects of pregabalin, amitriptyline and ropinirole - these are likely contributing to sedation in the daytime -Reviewed tylenol max daily dose 3000 mg/day; advised to try tylenol 1300 mg BID -Recommend to continue current medication  GERD (Goal: manage symptoms) -Controlled - PPI was increased recently due to increased stomach upset in s/o NSAID use -Current treatment  Omeprazole 40 mg BID - Appropriate, Effective, Safe, Accessible Tums PRN -Medications previously tried: Dexilant  -Counseled on long term PPI side effects including reduced bone density;  -Recommend to continue current medication  Iron deficiency anemia (Goal: HGB > 11, TSAT > 20) -Uncontrolled - HGB 8.3 (03/2022); TSAT 6 (12/2021); pt tried Fusion plus but could not tolerate it due to GI upset; she was meant to follow up with GI for repeat iron studies but other issues have delayed this -Pt had blood transfusion 04/18/22 following back surgery -Pt reports worsening shortness of breath during PT; discussed iron deficiency/anemia can  cause this -Advised to contact GI about repeating iron studies; consider iron transfusions  Interstitial cystitis (Goal: manage symptoms) -Controlled - pt has tried time off amitriptyline and sleep was negatively effected; she is not sure if it is helping or not with cystitis -Current treatment  Amitriptyline 25 mg daily HS - Appropriate, Query Effective,  -Medications previously tried: Elmiron, hydroxyzine -Recommend to continue current medication  Asthma (Goal: control symptoms and prevent exacerbations) -Controlled - uses albuterol before AM walk;  -Pulmonary function testing: 04/2021 - FEV1 76% predicted; -9.2% change post-bronchodilator -Current treatment  Albuterol HFA prn -Appropriate, Effective, Safe, Accessible -Recommended to continue current medication  Health Maintenance -Hx Bee allergy - has Epipen -Hx poor sleep: on amitriptyline, melatonin; has CPAP -RLS: on ropinirole; iron deficiency may contribute -OTC: Vitamin C, Vitamin B12, Miralax  Al Corpus, PharmD, [place], CPP Clinical Pharmacist Practitioner [facility] at [facility] [phone]

## 2022-06-06 ENCOUNTER — Ambulatory Visit
Admission: RE | Admit: 2022-06-06 | Discharge: 2022-06-06 | Disposition: A | Discharge status: Home / Self Care | Payer: Medicare Other | Source: Ambulatory Visit | Attending: Neurosurgery | Admitting: Neurosurgery

## 2022-06-06 ENCOUNTER — Encounter: Payer: Self-pay | Admitting: Neurosurgery

## 2022-06-06 ENCOUNTER — Ambulatory Visit (INDEPENDENT_AMBULATORY_CARE_PROVIDER_SITE_OTHER): Payer: Medicare Other | Admitting: Neurosurgery

## 2022-06-06 VITALS — BP 126/74 | HR 75 | Temp 97.8°F | Ht 59.0 in | Wt 167.0 lb

## 2022-06-06 DIAGNOSIS — M4325 Fusion of spine, thoracolumbar region: Secondary | ICD-10-CM

## 2022-06-06 DIAGNOSIS — M960 Pseudarthrosis after fusion or arthrodesis: Secondary | ICD-10-CM | POA: Diagnosis not present

## 2022-06-06 DIAGNOSIS — M546 Pain in thoracic spine: Secondary | ICD-10-CM | POA: Diagnosis not present

## 2022-06-06 DIAGNOSIS — Z09 Encounter for follow-up examination after completed treatment for conditions other than malignant neoplasm: Secondary | ICD-10-CM

## 2022-06-06 DIAGNOSIS — M549 Dorsalgia, unspecified: Secondary | ICD-10-CM | POA: Diagnosis not present

## 2022-06-06 MED ORDER — OMEPRAZOLE 40 MG PO CPDR: 40.0000 mg | DELAYED_RELEASE_CAPSULE | Freq: Every day | ORAL | 0 refills | Status: DC

## 2022-06-06 MED ORDER — OMEPRAZOLE 40 MG PO CPDR: 40.0000 mg | DELAYED_RELEASE_CAPSULE | Freq: Two times a day (BID) | ORAL | 0 refills | Status: DC

## 2022-06-06 NOTE — Progress Notes (Signed)
   REFERRING PHYSICIAN:  No referring provider defined for this encounter.  DOS: T10- pelvis fusion. T12-L1 posterior column osteotomy 04/17/22  HISTORY OF PRESENT ILLNESS: GHIA SUTO is status post thoracopelvic fusion.   She is doing very well.  She is taking Celebrex and muscle relaxants for pain control.  She still has stiffness and some back discomfort but is much better than she was prior to surgery.   PHYSICAL EXAMINATION:  General: Patient is well developed, well nourished, calm, collected, and in no apparent distress.   NEUROLOGICAL:  General: In no acute distress.   Awake, alert, oriented to person, place, and time.  Pupils equal round and reactive to light.  Facial tone is symmetric.  Tongue protrusion is midline.  There is no pronator drift.   Strength:            Side Iliopsoas Quads Hamstring PF DF EHL  R 5 5 5 5 5 5   L 5 5 5 5 5 5    Incision c/d/I and healing well   ROS (Neurologic):  Negative except as noted above  IMAGING: No complications noted  ASSESSMENT/PLAN:  TREMAYNE WERGIN is doing well after spinal fusion.  She has had significant improvement in pain.  She will slowly increase her activity over time.  She is on a 25 pound lifting limit.  Will start aquatic therapy.  We will see her back in clinic in approximately 6 weeks.    Venetia Night MD Department of neurosurgery

## 2022-06-07 DIAGNOSIS — M4854XD Collapsed vertebra, not elsewhere classified, thoracic region, subsequent encounter for fracture with routine healing: Secondary | ICD-10-CM | POA: Diagnosis not present

## 2022-06-07 DIAGNOSIS — M4324 Fusion of spine, thoracic region: Secondary | ICD-10-CM | POA: Diagnosis not present

## 2022-06-07 DIAGNOSIS — M4014 Other secondary kyphosis, thoracic region: Secondary | ICD-10-CM | POA: Diagnosis not present

## 2022-06-07 DIAGNOSIS — M6281 Muscle weakness (generalized): Secondary | ICD-10-CM | POA: Diagnosis not present

## 2022-06-07 DIAGNOSIS — M960 Pseudarthrosis after fusion or arthrodesis: Secondary | ICD-10-CM | POA: Diagnosis not present

## 2022-06-07 DIAGNOSIS — Z981 Arthrodesis status: Secondary | ICD-10-CM | POA: Diagnosis not present

## 2022-06-07 DIAGNOSIS — G4733 Obstructive sleep apnea (adult) (pediatric): Secondary | ICD-10-CM | POA: Diagnosis not present

## 2022-06-07 DIAGNOSIS — Z96653 Presence of artificial knee joint, bilateral: Secondary | ICD-10-CM | POA: Diagnosis not present

## 2022-06-07 DIAGNOSIS — M532X4 Spinal instabilities, thoracic region: Secondary | ICD-10-CM | POA: Diagnosis not present

## 2022-06-08 DIAGNOSIS — D649 Anemia, unspecified: Secondary | ICD-10-CM | POA: Diagnosis not present

## 2022-06-09 ENCOUNTER — Telehealth: Payer: Self-pay

## 2022-06-09 ENCOUNTER — Other Ambulatory Visit: Payer: Self-pay

## 2022-06-09 DIAGNOSIS — K269 Duodenal ulcer, unspecified as acute or chronic, without hemorrhage or perforation: Secondary | ICD-10-CM

## 2022-06-09 DIAGNOSIS — D509 Iron deficiency anemia, unspecified: Secondary | ICD-10-CM

## 2022-06-09 LAB — CBC
Hematocrit: 30.7 % — ABNORMAL LOW (ref 34.0–46.6)
Hemoglobin: 9.6 g/dL — ABNORMAL LOW (ref 11.1–15.9)
MCH: 22.9 pg — ABNORMAL LOW (ref 26.6–33.0)
MCHC: 31.3 g/dL — ABNORMAL LOW (ref 31.5–35.7)
MCV: 73 fL — ABNORMAL LOW (ref 79–97)
Platelets: 415 10*3/uL (ref 150–450)
RBC: 4.19 x10E6/uL (ref 3.77–5.28)
RDW: 16.4 % — ABNORMAL HIGH (ref 11.7–15.4)
WBC: 8.2 10*3/uL (ref 3.4–10.8)

## 2022-06-09 LAB — IRON,TIBC AND FERRITIN PANEL
Ferritin: 16 ng/mL (ref 15–150)
Iron Saturation: 5 % — CL (ref 15–55)
Iron: 22 ug/dL — ABNORMAL LOW (ref 27–139)
Total Iron Binding Capacity: 409 ug/dL (ref 250–450)
UIBC: 387 ug/dL — ABNORMAL HIGH (ref 118–369)

## 2022-06-09 LAB — B12 AND FOLATE PANEL
Folate: 15.6 ng/mL (ref >3.0)
Vitamin B-12: 506 pg/mL (ref 232–1245)

## 2022-06-09 NOTE — Telephone Encounter (Signed)
-----   Message from Toney Reil, MD sent at 06/09/2022 10:56 AM EDT ----- She has persistent iron deficiency, her hemoglobin has slightly improved.  With her past history of duodenal ulcer, recommend upper endoscopy to make sure she does not have any recurrence of gastric or duodenal ulcers given multiple back surgeries.  Recommend, continue taking Prilosec 40 mg p.o. twice daily before meals and start fusion plus every other day for 3 months and recheck CBC and iron panel in 3 months.  If she does not prefer oral iron, we can refer her to hematology for IV iron  Rohini Vanga

## 2022-06-09 NOTE — Telephone Encounter (Signed)
Called patient and she schedule EGD for 06/15/2022 in [place]. Went over instructions and sent to [employer]. Patient states she can not take the Fusion plus because they make her nausea. Sent a referral to Hematology for IV iron infusions. Patient will take the omeprazole 40mg  twice a day

## 2022-06-12 ENCOUNTER — Other Ambulatory Visit: Payer: Self-pay | Admitting: Family Medicine

## 2022-06-12 ENCOUNTER — Encounter
Admission: RE | Admit: 2022-06-12 | Discharge: 2022-06-12 | Disposition: A | Discharge status: Home / Self Care | Payer: Medicare Other | Source: Ambulatory Visit | Attending: Gastroenterology | Admitting: Gastroenterology

## 2022-06-12 ENCOUNTER — Encounter: Payer: Self-pay | Admitting: Gastroenterology

## 2022-06-12 DIAGNOSIS — Z79899 Other long term (current) drug therapy: Secondary | ICD-10-CM | POA: Insufficient documentation

## 2022-06-12 DIAGNOSIS — Z01812 Encounter for preprocedural laboratory examination: Secondary | ICD-10-CM | POA: Insufficient documentation

## 2022-06-12 LAB — POTASSIUM: Potassium: 3.6 mmol/L (ref 3.5–5.1)

## 2022-06-12 NOTE — Anesthesia Preprocedure Evaluation (Signed)
Anesthesia Evaluation  Patient identified by MRN, date of birth, ID band Patient awake    Reviewed: Allergy & Precautions, H&P , NPO status , Patient's Chart, lab work & pertinent test results  History of Anesthesia Complications (+) history of anesthetic complications  Airway Mallampati: III  TM Distance: <3 FB Neck ROM: Full    Dental no notable dental hx.    Pulmonary neg pulmonary ROS, shortness of breath and with exertion, asthma , sleep apnea and Continuous Positive Airway Pressure Ventilation , pneumonia, COPD Patient states she cna become dyspneic with talking, but did not become dyspneic on pre-op evaluation and discussion today. Becomes dyspneic with walking or light exercise   Pulmonary exam normal breath sounds clear to auscultation       Cardiovascular Exercise Tolerance: Poor hypertension, + CAD  Normal cardiovascular exam Rhythm:Regular Rate:Normal  Echo 05-2019 EF 55-60%, moderately elevated PASP,  LA mildly dilated   Neuro/Psych  Headaches RLS  Neuromuscular disease negative neurological ROS  negative psych ROS   GI/Hepatic Neg liver ROS, PUD,GERD  ,,  Endo/Other  negative endocrine ROS    Renal/GU negative Renal ROS   Hx urinary incontinence negative genitourinary   Musculoskeletal negative musculoskeletal ROS (+) Arthritis ,  Post-laminectomy syndrome    Abdominal   Peds negative pediatric ROS (+)  Hematology negative hematology ROS (+) Blood dyscrasia, anemia   Anesthesia Other Findings Arthritis GERD (gastroesophageal reflux disease) Hyperlipidemia Hypertension Colon polyps Urinary incontinence RLS (restless legs syndrome) SOB (shortness of breath) Asthma, exercise-induced Dyspnea OSA on CPAP Plantar fasciitis Peripheral neuropathy Bacteremia due to Gram-negative bacteria Duodenal ulcer Osteopenia Post laminectomy syndrome Pneumonia Anemia Diastolic Dysfunction CAD (coronary  artery disease) COPD (chronic obstructive pulmonary disease) Insomnia   Neuropathy right leg and foot        History of 2019 novel coronavirus disease (COVID-19) History of lumbar fusion T12 compression fracture Compression fracture of L1 lumbar vertebra Awareness under anesthesia as a Complication of anesthesia Pulmonary hypertension "Moderately elevated PASP" per echo Wears hearing aid in both ears   Reproductive/Obstetrics negative OB ROS                             Anesthesia Physical Anesthesia Plan  ASA: 3  Anesthesia Plan: General   Post-op Pain Management:    Induction: Intravenous  PONV Risk Score and Plan:   Airway Management Planned: Natural Airway and Nasal Cannula  Additional Equipment:   Intra-op Plan:   Post-operative Plan:   Informed Consent: I have reviewed the patients History and Physical, chart, labs and discussed the procedure including the risks, benefits and alternatives for the proposed anesthesia with the patient or authorized representative who has indicated his/her understanding and acceptance.     Dental Advisory Given  Plan Discussed with: Anesthesiologist, CRNA and Surgeon  Anesthesia Plan Comments: (Patient consented for risks of anesthesia including but not limited to:  - adverse reactions to medications - risk of airway placement if required - damage to eyes, teeth, lips or other oral mucosa - nerve damage due to positioning  - sore throat or hoarseness - Damage to heart, brain, nerves, lungs, other parts of body or loss of life  Patient voiced understanding.)        Anesthesia Quick Evaluation

## 2022-06-15 ENCOUNTER — Ambulatory Visit: Payer: Medicare Other | Admitting: Anesthesiology

## 2022-06-15 ENCOUNTER — Ambulatory Visit
Admission: RE | Admit: 2022-06-15 | Discharge: 2022-06-15 | Disposition: A | Discharge status: Home / Self Care | Payer: Medicare Other | Attending: Gastroenterology | Admitting: Gastroenterology

## 2022-06-15 ENCOUNTER — Encounter
Admission: RE | Disposition: A | Discharge status: Home / Self Care | Payer: Self-pay | Source: Home / Self Care | Attending: Gastroenterology

## 2022-06-15 ENCOUNTER — Other Ambulatory Visit: Payer: Self-pay

## 2022-06-15 DIAGNOSIS — M1990 Unspecified osteoarthritis, unspecified site: Secondary | ICD-10-CM | POA: Insufficient documentation

## 2022-06-15 DIAGNOSIS — G2581 Restless legs syndrome: Secondary | ICD-10-CM | POA: Insufficient documentation

## 2022-06-15 DIAGNOSIS — Z8719 Personal history of other diseases of the digestive system: Secondary | ICD-10-CM

## 2022-06-15 DIAGNOSIS — I10 Essential (primary) hypertension: Secondary | ICD-10-CM | POA: Diagnosis not present

## 2022-06-15 DIAGNOSIS — K219 Gastro-esophageal reflux disease without esophagitis: Secondary | ICD-10-CM | POA: Insufficient documentation

## 2022-06-15 DIAGNOSIS — G4733 Obstructive sleep apnea (adult) (pediatric): Secondary | ICD-10-CM | POA: Insufficient documentation

## 2022-06-15 DIAGNOSIS — K3189 Other diseases of stomach and duodenum: Secondary | ICD-10-CM | POA: Diagnosis not present

## 2022-06-15 DIAGNOSIS — K269 Duodenal ulcer, unspecified as acute or chronic, without hemorrhage or perforation: Secondary | ICD-10-CM

## 2022-06-15 DIAGNOSIS — J4489 Other specified chronic obstructive pulmonary disease: Secondary | ICD-10-CM | POA: Diagnosis not present

## 2022-06-15 DIAGNOSIS — Z8616 Personal history of COVID-19: Secondary | ICD-10-CM | POA: Diagnosis not present

## 2022-06-15 DIAGNOSIS — K2970 Gastritis, unspecified, without bleeding: Secondary | ICD-10-CM | POA: Diagnosis not present

## 2022-06-15 DIAGNOSIS — D509 Iron deficiency anemia, unspecified: Secondary | ICD-10-CM | POA: Diagnosis not present

## 2022-06-15 DIAGNOSIS — Z8711 Personal history of peptic ulcer disease: Secondary | ICD-10-CM | POA: Diagnosis not present

## 2022-06-15 DIAGNOSIS — J45990 Exercise induced bronchospasm: Secondary | ICD-10-CM | POA: Diagnosis not present

## 2022-06-15 DIAGNOSIS — E785 Hyperlipidemia, unspecified: Secondary | ICD-10-CM | POA: Insufficient documentation

## 2022-06-15 DIAGNOSIS — Z01812 Encounter for preprocedural laboratory examination: Secondary | ICD-10-CM

## 2022-06-15 DIAGNOSIS — I2510 Atherosclerotic heart disease of native coronary artery without angina pectoris: Secondary | ICD-10-CM | POA: Diagnosis not present

## 2022-06-15 DIAGNOSIS — Z79899 Other long term (current) drug therapy: Secondary | ICD-10-CM

## 2022-06-15 HISTORY — PX: ESOPHAGOGASTRODUODENOSCOPY (EGD) WITH PROPOFOL: SHX5813

## 2022-06-15 HISTORY — DX: Presence of external hearing-aid: Z97.4

## 2022-06-15 SURGERY — ESOPHAGOGASTRODUODENOSCOPY (EGD) WITH PROPOFOL
Anesthesia: General | Site: Mouth

## 2022-06-15 MED ORDER — PROPOFOL 500 MG/50ML IV EMUL
INTRAVENOUS | Status: DC | PRN
  Administered 2022-06-15: 140 ug/kg/min via INTRAVENOUS

## 2022-06-15 MED ORDER — LACTATED RINGERS IV SOLN: INTRAVENOUS | Status: DC

## 2022-06-15 MED ORDER — PROPOFOL 10 MG/ML IV BOLUS
INTRAVENOUS | Status: DC | PRN
  Administered 2022-06-15: 70 mg via INTRAVENOUS

## 2022-06-15 MED ORDER — LIDOCAINE HCL (CARDIAC) PF 100 MG/5ML IV SOSY
PREFILLED_SYRINGE | INTRAVENOUS | Status: DC | PRN
  Administered 2022-06-15: 80 mg via INTRAVENOUS

## 2022-06-15 MED ORDER — SODIUM CHLORIDE 0.9 % IV SOLN: INTRAVENOUS | Status: DC

## 2022-06-15 MED ORDER — STERILE WATER FOR IRRIGATION IR SOLN
Status: DC | PRN
  Administered 2022-06-15: 50 mL

## 2022-06-15 SURGICAL SUPPLY — 32 items
BALLN DILATOR 12-15 8 (BALLOONS)
BALLN DILATOR 15-18 8 (BALLOONS)
BALLN DILATOR CRE 0-12 8 (BALLOONS)
BALLN DILATOR ESOPH 8 10 CRE (MISCELLANEOUS) IMPLANT
BALLOON DILATOR 12-15 8 (BALLOONS) IMPLANT
BALLOON DILATOR 15-18 8 (BALLOONS) IMPLANT
BALLOON DILATOR CRE 0-12 8 (BALLOONS) IMPLANT
BLOCK BITE 60FR ADLT L/F GRN (MISCELLANEOUS) ×1 IMPLANT
CLIP HMST 235XBRD CATH ROT (MISCELLANEOUS) IMPLANT
CLIP RESOLUTION 360 11X235 (MISCELLANEOUS)
ELECT REM PT RETURN 9FT ADLT (ELECTROSURGICAL)
ELECTRODE REM PT RTRN 9FT ADLT (ELECTROSURGICAL) IMPLANT
FCP ESCP3.2XJMB 240X2.8X (MISCELLANEOUS)
FORCEPS BIOP RAD 4 LRG CAP 4 (CUTTING FORCEPS) IMPLANT
FORCEPS BIOP RJ4 240 W/NDL (MISCELLANEOUS)
FORCEPS ESCP3.2XJMB 240X2.8X (MISCELLANEOUS) IMPLANT
GOWN CVR UNV OPN BCK APRN NK (MISCELLANEOUS) ×2 IMPLANT
GOWN ISOL THUMB LOOP REG UNIV (MISCELLANEOUS) ×2
INJECTOR VARIJECT VIN23 (MISCELLANEOUS) IMPLANT
KIT DEFENDO VALVE AND CONN (KITS) IMPLANT
KIT PRC NS LF DISP ENDO (KITS) ×1 IMPLANT
KIT PROCEDURE OLYMPUS (KITS) ×1
MANIFOLD NEPTUNE II (INSTRUMENTS) ×1 IMPLANT
MARKER SPOT ENDO TATTOO 5ML (MISCELLANEOUS) IMPLANT
RETRIEVER NET PLAT FOOD (MISCELLANEOUS) IMPLANT
SNARE SHORT THROW 13M SML OVAL (MISCELLANEOUS) IMPLANT
SNARE SHORT THROW 30M LRG OVAL (MISCELLANEOUS) IMPLANT
SYR INFLATION 60ML (SYRINGE) IMPLANT
TRAP ETRAP POLY (MISCELLANEOUS) IMPLANT
VARIJECT INJECTOR VIN23 (MISCELLANEOUS)
WATER STERILE IRR 250ML POUR (IV SOLUTION) ×1 IMPLANT
WIRE CRE 18-20MM 8CM F G (MISCELLANEOUS) IMPLANT

## 2022-06-15 NOTE — Op Note (Signed)
[facility] Gastroenterology Patient Name: Erin Petersen Procedure Date: 06/15/2022 11:10 AM MRN: [id number] Account #: [id number] Date of Birth: 28-Oct-1940 Admit Type: Outpatient Age: 82 Room: [facility] OR ROOM 01 Gender: Female Note Status: Finalized Instrument Name: [id number] Procedure:             Upper GI endoscopy Indications:           Unexplained iron deficiency anemia Providers:             Toney Reil MD, MD Referring MD:          Audrie Gallus. Tower (Referring MD) Medicines:             General Anesthesia Complications:         No immediate complications. Estimated blood loss: None. Procedure:             Pre-Anesthesia Assessment:                        - Prior to the procedure, a History and Physical was                         performed, and patient medications and allergies were                         reviewed. The patient is competent. The risks and                         benefits of the procedure and the sedation options and                         risks were discussed with the patient. All questions                         were answered and informed consent was obtained.                         Patient identification and proposed procedure were                         verified by the physician, the nurse, the                         anesthesiologist, the anesthetist and the technician                         in the pre-procedure area in the procedure room in the                         endoscopy suite. Mental Status Examination: alert and                         oriented. Airway Examination: normal oropharyngeal                         airway and neck mobility. Respiratory Examination:                         clear to auscultation. CV Examination: normal.  Prophylactic Antibiotics: The patient does not require                         prophylactic antibiotics. Prior Anticoagulants: The                         patient has taken  no anticoagulant or antiplatelet                         agents. ASA Grade Assessment: III - A patient with                         severe systemic disease. After reviewing the risks and                         benefits, the patient was deemed in satisfactory                         condition to undergo the procedure. The anesthesia                         plan was to use general anesthesia. Immediately prior                         to administration of medications, the patient was                         re-assessed for adequacy to receive sedatives. The                         heart rate, respiratory rate, oxygen saturations,                         blood pressure, adequacy of pulmonary ventilation, and                         response to care were monitored throughout the                         procedure. The physical status of the patient was                         re-assessed after the procedure.                        After obtaining informed consent, the endoscope was                         passed under direct vision. Throughout the procedure,                         the patient's blood pressure, pulse, and oxygen                         saturations were monitored continuously. The Endoscope                         was introduced through the mouth, and advanced to the  second part of duodenum. The upper GI endoscopy was                         accomplished without difficulty. The patient tolerated                         the procedure well. Findings:      The duodenal bulb and examined duodenum were normal. Biopsies were taken       with a cold forceps for histology.      Striped mildly erythematous mucosa without bleeding was found in the       gastric antrum. Biopsies were taken with a cold forceps for histology.      The gastric fundus, gastric body and incisura were normal. Biopsies were       taken with a cold forceps for histology.      The cardia  and gastric fundus were normal on retroflexion.      The gastroesophageal junction and examined esophagus were normal. Impression:            - Normal duodenal bulb and examined duodenum. Biopsied.                        - Erythematous mucosa in the antrum. Biopsied.                        - Normal gastric fundus, gastric body and incisura.                         Biopsied.                        - Normal gastroesophageal junction and esophagus. Recommendation:        - Await pathology results.                        - Discharge patient to home (with escort).                        - Resume previous diet today.                        - Continue present medications. Procedure Code(s):     --- Professional ---                        [phone], Esophagogastroduodenoscopy, flexible,                         transoral; with biopsy, single or multiple Diagnosis Code(s):     --- Professional ---                        K31.89, Other diseases of stomach and duodenum                        D50.9, Iron deficiency anemia, unspecified CPT copyright 2022 American Medical Association. All rights reserved. The codes documented in this report are preliminary and upon coder review may  be revised to meet current compliance requirements. Dr. Libby Maw Toney Reil MD, MD 06/15/2022 11:35:17 AM This report has been signed electronically. Number of Addenda: 0 Note  Initiated On: 06/15/2022 11:10 AM Total Procedure Duration: 0 hours 7 minutes 45 seconds  Estimated Blood Loss:  Estimated blood loss: none.      [facility]

## 2022-06-15 NOTE — Anesthesia Postprocedure Evaluation (Signed)
Anesthesia Post Note  Patient: Erin Petersen  Procedure(s) Performed: ESOPHAGOGASTRODUODENOSCOPY (EGD) WITH PROPOFOL (Mouth)  Patient location during evaluation: PACU Anesthesia Type: General Level of consciousness: awake and alert Pain management: pain level controlled Vital Signs Assessment: post-procedure vital signs reviewed and stable Respiratory status: spontaneous breathing, nonlabored ventilation, respiratory function stable and patient connected to nasal cannula oxygen Cardiovascular status: blood pressure returned to baseline and stable Postop Assessment: no apparent nausea or vomiting Anesthetic complications: no   No notable events documented.   Last Vitals:  Vitals:   06/15/22 1138 06/15/22 1145  BP:  (!) 106/59  Pulse: 93   Resp:  16  Temp: 36.8 C   SpO2: 91%     Last Pain:  Vitals:   06/15/22 1138  TempSrc:   PainSc: 0-No pain                 Daylan Juhnke C Shawntina Diffee

## 2022-06-15 NOTE — H&P (Signed)
Arlyss Repress, MD [address]  [address]  [place], [place] [address]  Main: [phone]  Fax: [phone] Pager: [phone]  Primary Care Physician:  Tower, Audrie Gallus, MD Primary Gastroenterologist:  Dr. Arlyss Repress  Pre-Procedure History & Physical: HPI:  Erin Petersen is a 82 y.o. female is here for an endoscopy.   Past Medical History:  Diagnosis Date   Anemia    Arthritis    Asthma    Bacteremia due to Gram-negative bacteria 06/04/2019   CAD (coronary artery disease) 07/21/2016   a.) LHC 07/21/2016: mild nonobstructive CAD   Colon polyps    Complication of anesthesia    a.) anesthesia awareness   Compression fracture of L1 lumbar vertebra    COPD (chronic obstructive pulmonary disease)    Diastolic dysfunction    a.) TTE 03/02/2021: EF >55%, mild LVH, mild LAE, triv MR, mild TR/PR, G1DD   Duodenal ulcer    Dyspnea    GERD (gastroesophageal reflux disease)    History of 2019 novel coronavirus disease (COVID-19) 01/19/2021   a.) tested (+) at [facility]   History of lumbar fusion    a.) T12-S1   Hyperlipidemia    Hypertension    Insomnia    a.) takes melatonin PRN   OSA on CPAP    Osteopenia    Peripheral neuropathy    Plantar fasciitis    Pneumonia    Pulmonary hypertension    a.) TTE 06/04/2019: moderate (PASP 40.3 mmHg)   Right inguinal hernia    a.) s/p repair 12/2016   RLS (restless legs syndrome)    a.) on ropinirole   SOB (shortness of breath)    T12 compression fracture    Umbilical hernia    a.) s/p repair 12/2016   Urinary incontinence    Urinary tract infection    Wears hearing aid in both ears     Past Surgical History:  Procedure Laterality Date   ABDOMINAL HYSTERECTOMY  1972   APPENDECTOMY  1958   APPLICATION OF INTRAOPERATIVE CT SCAN N/A 04/17/2022   Procedure: APPLICATION OF INTRAOPERATIVE CT SCAN;  Surgeon: Venetia Night, MD;  Location: [facility] ORS;  Service: Neurosurgery;  Laterality: N/A;   BILATERAL  SALPINGOOPHORECTOMY  01/07/2018   BIOPSY  04/15/2019   Procedure: BIOPSY;  Surgeon: Toney Reil, MD;  Location: [facility];  Service: Endoscopy;;   CARDIAC CATHETERIZATION Left 07/21/2016   COLONOSCOPY WITH PROPOFOL N/A 11/11/2018   Procedure: COLONOSCOPY WITH PROPOFOL;  Surgeon: Toney Reil, MD;  Location: [facility] ENDOSCOPY;  Service: Gastroenterology;  Laterality: N/A;   CYSTO WITH HYDRODISTENSION N/A 12/30/2018   Procedure: CYSTOSCOPY/HYDRODISTENSION WITH MARCAINE;  Surgeon: Alfredo Martinez, MD;  Location: [facility] ORS;  Service: Urology;  Laterality: N/A;   ESOPHAGOGASTRODUODENOSCOPY (EGD) WITH PROPOFOL N/A 04/15/2019   Procedure: ESOPHAGOGASTRODUODENOSCOPY (EGD) WITH PROPOFOL;  Surgeon: Toney Reil, MD;  Location: [facility];  Service: Endoscopy;  Laterality: N/A;  sleep apnea   ESOPHAGOGASTRODUODENOSCOPY (EGD) WITH PROPOFOL N/A 07/09/2019   Procedure: ESOPHAGOGASTRODUODENOSCOPY (EGD) WITH PROPOFOL;  Surgeon: Toney Reil, MD;  Location: [facility] ENDOSCOPY;  Service: Gastroenterology;  Laterality: N/A;   EYE SURGERY Bilateral    INGUINAL HERNIA REPAIR Bilateral 01/05/2017   JOINT REPLACEMENT     LAPAROSCOPIC BILATERAL SALPINGO OOPHERECTOMY  2019   LUMBAR FUSION  2002   LUMBAR FUSION  11/14/2021   Dr Sharolyn Douglas   LUMBAR FUSION  12/14/2021   Dr Malachy Chamber   SPINAL FUSION  2002   TONSILLECTOMY  AND ADENOIDECTOMY  1961   TOTAL KNEE ARTHROPLASTY Right 01/01/2020   Procedure: Right total knee arthroplasty - [place] Neighbor to Assist;  Surgeon: Kennedy Bucker, MD;  Location: [facility] ORS;  Service: Orthopedics;  Laterality: Right;   TOTAL KNEE ARTHROPLASTY Left 10/12/2020   Procedure: TOTAL KNEE ARTHROPLASTY;  Surgeon: Kennedy Bucker, MD;  Location: [facility] ORS;  Service: Orthopedics;  Laterality: Left;   TUBAL LIGATION     UMBILICAL HERNIA REPAIR N/A 01/05/2017    Prior to Admission medications   Medication Sig Start Date End Date Taking? Authorizing  Provider  Acetaminophen (TYLENOL PO) Take 650 mg by mouth 5 (five) times daily.   Yes [provider]  albuterol (VENTOLIN HFA) 108 (90 Base) MCG/ACT inhaler Inhale 2 puffs into the lungs every 6 (six) hours as needed for wheezing or shortness of breath. 02/24/21  Yes McDonough, Lauren K, PA-C  amitriptyline (ELAVIL) 25 MG tablet TAKE 1 TABLET AT BEDTIME 06/13/22  Yes Tower, Marne A, MD  amLODipine (NORVASC) 5 MG tablet Take 1 tablet (5 mg total) by mouth daily. 03/14/21  Yes Tower, Audrie Gallus, MD  aspirin 81 MG EC tablet Take 81 mg by mouth at bedtime.    Yes [provider]  bisacodyl (DULCOLAX) 5 MG EC tablet Take 1-2 tablets (5-10 mg total) by mouth daily as needed for moderate constipation. 04/23/22  Yes Venetia Night, MD  calcium carbonate (TUMS EX) 750 MG chewable tablet Chew 3 tablets by mouth daily.   Yes [provider]  CALCIUM-VITAMIN D-VITAMIN K PO Take 750 mg by mouth daily.   Yes [provider]  celecoxib (CELEBREX) 200 MG capsule TAKE 1 CAPSULE BY MOUTH TWICE A DAY 05/20/22  Yes Venetia Night, MD  Cholecalciferol (VITAMIN D3) 50 MCG (2000 UT) TABS Take 2,000 Units by mouth daily.   Yes [provider]  Cyanocobalamin (VITAMIN B-12) 1000 MCG SUBL Take 2,000 mcg by mouth daily.   Yes [provider]  EPINEPHrine 0.3 mg/0.3 mL IJ SOAJ injection Inject 0.3 mg into the muscle as needed for anaphylaxis.   Yes [provider]  fluticasone (FLONASE) 50 MCG/ACT nasal spray Place 2 sprays into both nostrils daily. qhs   Yes [provider]  hydrochlorothiazide (HYDRODIURIL) 25 MG tablet TAKE 1 TABLET DAILY 03/28/22  Yes Tower, Audrie Gallus, MD  Melatonin 10 MG CAPS Take 1 capsule by mouth as needed.   Yes [provider]  methocarbamol (ROBAXIN) 500 MG tablet Take 1 tablet (500 mg total) by mouth every 6 (six) hours as needed for muscle spasms. 04/23/22  Yes Venetia Night, MD  omeprazole (PRILOSEC) 40 MG capsule  Take 1 capsule (40 mg total) by mouth 2 (two) times daily before a meal. 06/06/22  Yes Marijane Trower, Loel Dubonnet, MD  polyethylene glycol (MIRALAX / GLYCOLAX) 17 g packet Take 1 packet by mouth daily.    Yes [provider]  pregabalin (LYRICA) 300 MG capsule Take 300 mg by mouth 2 (two) times daily. 150 mg in am and 300 mg at bedtime 12/11/21  Yes [provider]  rOPINIRole (REQUIP) 1 MG tablet Take 1 mg by mouth in the morning and at bedtime. 1 in am and 1 at 3pm   Yes [provider]  rOPINIRole (REQUIP) 4 MG tablet TAKE 1 TABLET AT BEDTIME 03/08/21  Yes Tower, Audrie Gallus, MD  simvastatin (ZOCOR) 20 MG tablet TAKE 1 TABLET AT BEDTIME 08/15/21  Yes Tower, Audrie Gallus, MD  Ascorbic Acid (VITAMIN C) 1000 MG tablet  Take 750 mg by mouth daily. Patient not taking: Reported on 06/12/2022    [provider]  ondansetron (ZOFRAN-ODT) 4 MG disintegrating tablet Take 1 tablet (4 mg total) by mouth every 8 (eight) hours as needed for nausea. Patient not taking: Reported on 06/12/2022 03/31/22   Judy Pimple, MD    Allergies as of 06/09/2022 - Review Complete 06/06/2022  Allergen Reaction Noted   Bee venom Anaphylaxis and Hives 12/19/2009   Fosamax [alendronate] Nausea Only 03/13/2020   Fusion plus [iron-fa-b cmp-c-biot-probiotic] Nausea Only 04/06/2022   Ciprofloxacin hcl Rash 12/29/2016   Sulfa antibiotics Rash 06/10/2012    Family History  Problem Relation Age of Onset   Alcohol abuse Mother    Cancer Mother    Stroke Mother    Alcohol abuse Father    Arthritis Sister    Arthritis Daughter    COPD Daughter    Arthritis Paternal Grandmother    Asthma Paternal Grandmother    COPD Paternal Grandmother    Breast cancer Paternal Aunt     Social History   Socioeconomic History   Marital status: Widowed    Spouse name: Not on file   Number of children: 3   Years of education: Not on file   Highest education level: Bachelor's degree (e.g., BA, AB, BS)  Occupational  History   Not on file  Tobacco Use   Smoking status: Never   Smokeless tobacco: Never  Vaping Use   Vaping Use: Never used  Substance and Sexual Activity   Alcohol use: Not Currently   Drug use: Never   Sexual activity: Not Currently  Other Topics Concern   Not on file  Social History Narrative   Moved from [place] 2020, lives near daughter. Retired. Enjoys walking. Lives alone   Social Determinants of Health   Financial Resource Strain: Low Risk  (08/26/2021)   Overall Financial Resource Strain (CARDIA)    Difficulty of Paying Living Expenses: Not hard at all  Food Insecurity: No Food Insecurity (04/25/2022)   Hunger Vital Sign    Worried About Running Out of Food in the Last Year: Never true    Ran Out of Food in the Last Year: Never true  Transportation Needs: No Transportation Needs (04/25/2022)   PRAPARE - Administrator, Civil Service (Medical): No    Lack of Transportation (Non-Medical): No  Physical Activity: Inactive (08/26/2021)   Exercise Vital Sign    Days of Exercise per Week: 0 days    Minutes of Exercise per Session: 0 min  Stress: No Stress Concern Present (08/26/2021)   [employer] of Occupational Health - Occupational Stress Questionnaire    Feeling of Stress : Not at all  Social Connections: Moderately Integrated (08/26/2021)   Social Connection and Isolation Panel [NHANES]    Frequency of Communication with Friends and Family: More than three times a week    Frequency of Social Gatherings with Friends and Family: Three times a week    Attends Religious Services: More than 4 times per year    Active Member of Clubs or Organizations: Yes    Attends Banker Meetings: More than 4 times per year    Marital Status: Widowed  Intimate Partner Violence: Not At Risk (04/21/2022)   Humiliation, Afraid, Rape, and Kick questionnaire    Fear of Current or Ex-Partner: No    Emotionally Abused: No    Physically Abused: No    Sexually Abused:  No    Review  of Systems: See HPI, otherwise negative ROS  Physical Exam: BP (!) 157/71   Temp (!) 97.2 F (36.2 C) (Temporal)   Resp 15   Ht  (1.499 m)   Wt 77 kg   LMP  (LMP Unknown)   SpO2 98%   BMI 34.28 kg/m  General:   Alert,  pleasant and cooperative in NAD Head:  Normocephalic and atraumatic. Neck:  Supple; no masses or thyromegaly. Lungs:  Clear throughout to auscultation.    Heart:  Regular rate and rhythm. Abdomen:  Soft, nontender and nondistended. Normal bowel sounds, without guarding, and without rebound.   Neurologic:  Alert and  oriented x4;  grossly normal neurologically.  Impression/Plan: Erin Petersen is here for an endoscopy to be performed for worsening IDA  Risks, benefits, limitations, and alternatives regarding  endoscopy have been reviewed with the patient.  Questions have been answered.  All parties agreeable.   Lannette Donath, MD  06/15/2022, 10:16 AM

## 2022-06-15 NOTE — Transfer of Care (Signed)
Immediate Anesthesia Transfer of Care Note  Patient: Erin Petersen  Procedure(s) Performed: ESOPHAGOGASTRODUODENOSCOPY (EGD) WITH PROPOFOL (Mouth)  Patient Location: PACU  Anesthesia Type:General  Level of Consciousness: awake, alert , and oriented  Airway & Oxygen Therapy: Patient Spontanous Breathing  Post-op Assessment: Report given to RN and Post -op Vital signs reviewed and stable  Post vital signs: Reviewed and stable  Last Vitals:  Vitals Value Taken Time  BP 104/69 06/15/22 1138  Temp 36.8 C 06/15/22 1138  Pulse 55 06/15/22 1140  Resp 14 06/15/22 1140  SpO2 97 % 06/15/22 1140  Vitals shown include unvalidated device data.  Last Pain:  Vitals:   06/15/22 1138  TempSrc:   PainSc: 0-No pain         Complications: No notable events documented.

## 2022-06-16 ENCOUNTER — Inpatient Hospital Stay: Payer: Medicare Other

## 2022-06-16 ENCOUNTER — Inpatient Hospital Stay: Payer: Medicare Other | Attending: Oncology | Admitting: Oncology

## 2022-06-16 ENCOUNTER — Encounter: Payer: Self-pay | Admitting: Gastroenterology

## 2022-06-16 VITALS — BP 140/68 | HR 80 | Temp 96.4°F | Resp 18 | Ht 59.0 in | Wt 172.0 lb

## 2022-06-16 DIAGNOSIS — D509 Iron deficiency anemia, unspecified: Secondary | ICD-10-CM | POA: Insufficient documentation

## 2022-06-16 DIAGNOSIS — Z96653 Presence of artificial knee joint, bilateral: Secondary | ICD-10-CM | POA: Diagnosis not present

## 2022-06-16 DIAGNOSIS — M4324 Fusion of spine, thoracic region: Secondary | ICD-10-CM | POA: Diagnosis not present

## 2022-06-16 DIAGNOSIS — M960 Pseudarthrosis after fusion or arthrodesis: Secondary | ICD-10-CM | POA: Diagnosis not present

## 2022-06-16 DIAGNOSIS — G4733 Obstructive sleep apnea (adult) (pediatric): Secondary | ICD-10-CM | POA: Diagnosis not present

## 2022-06-16 DIAGNOSIS — Z981 Arthrodesis status: Secondary | ICD-10-CM | POA: Diagnosis not present

## 2022-06-16 DIAGNOSIS — Z79899 Other long term (current) drug therapy: Secondary | ICD-10-CM | POA: Insufficient documentation

## 2022-06-16 DIAGNOSIS — M532X4 Spinal instabilities, thoracic region: Secondary | ICD-10-CM | POA: Diagnosis not present

## 2022-06-16 DIAGNOSIS — M4854XD Collapsed vertebra, not elsewhere classified, thoracic region, subsequent encounter for fracture with routine healing: Secondary | ICD-10-CM | POA: Diagnosis not present

## 2022-06-16 DIAGNOSIS — M4014 Other secondary kyphosis, thoracic region: Secondary | ICD-10-CM | POA: Diagnosis not present

## 2022-06-16 DIAGNOSIS — M6281 Muscle weakness (generalized): Secondary | ICD-10-CM | POA: Diagnosis not present

## 2022-06-16 NOTE — Progress Notes (Signed)
Hematology/Oncology Consult note [facility] Telephone:([phone] [phone]  Patient Care Team: Tower, Audrie Gallus, MD as PCP - General (Family Medicine) Kathyrn Sheriff, [facility] as Pharmacist (Pharmacist)   Name of the patient: Erin Petersen  [id number]  21-Jul-1940    Reason for referral-iron deficiency anemia   Referring physician-Dr. Allegra Lai  Date of visit: 06/16/22   History of presenting illness- Patient is a 82 year old female with a past medical history significant for coronary artery disease, hypertension hyperlipidemia, COPD obstructive sleep apnea among other medical problems.  She has been referred for iron deficiency anemia.  Most recent CBC from 06/08/2022 showed white cell count of 8.2, H&H of 9.6/30.7 with an MCV of 73 and a platelet count of 415.  Iron studies showed ferritin of 16 with an iron saturation of 5% and low serum iron as well.  B12 and folate levels were normal.  Her baseline hemoglobin runs around 12 which was the case up on August 2022 and subsequently it started drifting down further.  It went down to 6.4 in February 2024.  She was seen by Dr. Allegra Lai for her iron deficiency upper endoscopy from 06/15/2022 showed normal duodenal bulb.  Erythematous mucosa noted in the antrum which was biopsied and is currently pending.  Normal-appearing gastric fundus body as well as GE junction and esophagus.  Her last colonoscopy was in 2020 which showed polyps in the descending and ascending colon and repeat colonoscopy was recommended in 5 years.  She has been referred for consideration of IV iron.  She has a prior history of duodenal ulcer.  Patient had revision back surgery in feb 2024  ECOG PS- 2  Pain scale- 2   Review of systems- Review of Systems  Constitutional:  Negative for chills, fever, malaise/fatigue and weight loss.  HENT:  Negative for congestion, ear discharge and nosebleeds.   Eyes:  Negative for blurred vision.   Respiratory:  Negative for cough, hemoptysis, sputum production, shortness of breath and wheezing.   Cardiovascular:  Negative for chest pain, palpitations, orthopnea and claudication.  Gastrointestinal:  Negative for abdominal pain, blood in stool, constipation, diarrhea, heartburn, melena, nausea and vomiting.  Genitourinary:  Negative for dysuria, flank pain, frequency, hematuria and urgency.  Musculoskeletal:  Positive for back pain. Negative for joint pain and myalgias.  Skin:  Negative for rash.  Neurological:  Negative for dizziness, tingling, focal weakness, seizures, weakness and headaches.  Endo/Heme/Allergies:  Does not bruise/bleed easily.  Psychiatric/Behavioral:  Negative for depression and suicidal ideas. The patient does not have insomnia.     Allergies  Allergen Reactions   Bee Venom Anaphylaxis and Hives   Fosamax [Alendronate] Nausea Only    Stomach pain   Fusion Plus [Iron-Fa-B Cmp-C-Biot-Probiotic] Nausea Only    Stomach pain   Ciprofloxacin Hcl Rash    Mild rash after long term use   Sulfa Antibiotics Rash    Bactrim    Patient Active Problem List   Diagnosis Date Noted   History of duodenal ulcer 06/15/2022   Pseudoarthrosis of thoracic spine after fusion procedure 04/17/2022   Other secondary kyphosis, thoracic region 04/17/2022   Compression fracture of T12 vertebra 04/17/2022   Spinal instability, thoracic 04/17/2022   Abdominal wall bulge 01/31/2022   Iron deficiency anemia 01/31/2022   Constipation due to pain medication 12/28/2021   Post-operative pain 12/28/2021   Back pain at L4-L5 level 12/01/2021   Inguinal hernia of right side without obstruction or gangrene 11/15/2021   Umbilical hernia  without obstruction or gangrene 11/15/2021   Coronary artery disease involving native coronary artery of native heart 10/07/2021   Other secondary scoliosis, lumbar region 10/04/2021   Postlaminectomy syndrome of lumbar region 10/04/2021   Pedal edema  07/26/2021   Mild pulmonary hypertension 07/26/2021   Hyperlipidemia 11/17/2020   S/P TKR (total knee replacement) using cement, left 10/12/2020   Somnolence, daytime 09/09/2020   Rash 09/09/2020   Medicare annual wellness visit, subsequent 08/25/2020   Encounter for screening mammogram for breast cancer 08/25/2020   Encounter for general adult medical examination with abnormal findings 08/25/2020   GERD (gastroesophageal reflux disease) 08/25/2020   Dizzy spells 08/25/2020   Migraine 03/12/2020   IBS (irritable bowel syndrome) 03/12/2020   S/P TKR (total knee replacement) using cement, right 01/01/2020   Colonic polyp 10/22/2019   History of vaginal hysterectomy 06/11/2019   Estrogen deficiency 04/22/2019   History of skin cancer 04/22/2019   High-tone pelvic floor dysfunction 04/03/2019   IC (interstitial cystitis) 04/03/2019   Neuralgia of both pudendal nerves 04/03/2019   Pelvic pain in female 04/03/2019   Osteoporosis 02/12/2019   Asthma 01/31/2019   Adverse reaction to influenza vaccine, initial encounter 12/03/2018   Vitamin D deficiency 12/03/2018   Restless legs syndrome 11/28/2018   Small fiber neuropathy 11/28/2018   Allergic rhinitis 10/08/2018   Benign essential hypertension 10/08/2018   Chronic bladder pain 10/08/2018   Neuropathy 10/08/2018   Osteoarthritis 10/08/2018   S/P BSO (bilateral salpingo-oophorectomy) 01/07/2018   OSA on CPAP 12/29/2016   Chronic obstructive pulmonary disease 12/29/2016   Chronic dyspnea 06/24/2012   Other and unspecified disc disorder of lumbar region 10/25/2000   Diverticulosis of colon 10/25/2000   Prediabetes 09/24/1998   Tinnitus of both ears 09/24/1983     Past Medical History:  Diagnosis Date   Anemia    Arthritis    Asthma    Bacteremia due to Gram-negative bacteria 06/04/2019   CAD (coronary artery disease) 07/21/2016   a.) LHC 07/21/2016: mild nonobstructive CAD   Colon polyps    Complication of anesthesia     a.) anesthesia awareness   Compression fracture of L1 lumbar vertebra    COPD (chronic obstructive pulmonary disease)    Diastolic dysfunction    a.) TTE 03/02/2021: EF >55%, mild LVH, mild LAE, triv MR, mild TR/PR, G1DD   Duodenal ulcer    Dyspnea    GERD (gastroesophageal reflux disease)    History of 2019 novel coronavirus disease (COVID-19) 01/19/2021   a.) tested (+) at [facility]   History of lumbar fusion    a.) T12-S1   Hyperlipidemia    Hypertension    Insomnia    a.) takes melatonin PRN   OSA on CPAP    Osteopenia    Peripheral neuropathy    Plantar fasciitis    Pneumonia    Pulmonary hypertension    a.) TTE 06/04/2019: moderate (PASP 40.3 mmHg)   Right inguinal hernia    a.) s/p repair 12/2016   RLS (restless legs syndrome)    a.) on ropinirole   SOB (shortness of breath)    T12 compression fracture    Umbilical hernia    a.) s/p repair 12/2016   Urinary incontinence    Urinary tract infection    Wears hearing aid in both ears      Past Surgical History:  Procedure Laterality Date   ABDOMINAL HYSTERECTOMY  1972   APPENDECTOMY  1958   APPLICATION OF INTRAOPERATIVE CT SCAN N/A 04/17/2022  Procedure: APPLICATION OF INTRAOPERATIVE CT SCAN;  Surgeon: Venetia Night, MD;  Location: [facility] ORS;  Service: Neurosurgery;  Laterality: N/A;   BILATERAL SALPINGOOPHORECTOMY  01/07/2018   BIOPSY  04/15/2019   Procedure: BIOPSY;  Surgeon: Toney Reil, MD;  Location: [facility];  Service: Endoscopy;;   CARDIAC CATHETERIZATION Left 07/21/2016   COLONOSCOPY WITH PROPOFOL N/A 11/11/2018   Procedure: COLONOSCOPY WITH PROPOFOL;  Surgeon: Toney Reil, MD;  Location: [facility] ENDOSCOPY;  Service: Gastroenterology;  Laterality: N/A;   CYSTO WITH HYDRODISTENSION N/A 12/30/2018   Procedure: CYSTOSCOPY/HYDRODISTENSION WITH MARCAINE;  Surgeon: Alfredo Martinez, MD;  Location: [facility] ORS;  Service: Urology;  Laterality: N/A;   ESOPHAGOGASTRODUODENOSCOPY (EGD) WITH  PROPOFOL N/A 04/15/2019   Procedure: ESOPHAGOGASTRODUODENOSCOPY (EGD) WITH PROPOFOL;  Surgeon: Toney Reil, MD;  Location: [facility];  Service: Endoscopy;  Laterality: N/A;  sleep apnea   ESOPHAGOGASTRODUODENOSCOPY (EGD) WITH PROPOFOL N/A 07/09/2019   Procedure: ESOPHAGOGASTRODUODENOSCOPY (EGD) WITH PROPOFOL;  Surgeon: Toney Reil, MD;  Location: [facility] ENDOSCOPY;  Service: Gastroenterology;  Laterality: N/A;   ESOPHAGOGASTRODUODENOSCOPY (EGD) WITH PROPOFOL N/A 06/15/2022   Procedure: ESOPHAGOGASTRODUODENOSCOPY (EGD) WITH PROPOFOL;  Surgeon: Toney Reil, MD;  Location: [facility];  Service: Endoscopy;  Laterality: N/A;  sleep apnea   EYE SURGERY Bilateral    INGUINAL HERNIA REPAIR Bilateral 01/05/2017   JOINT REPLACEMENT     LAPAROSCOPIC BILATERAL SALPINGO OOPHERECTOMY  2019   LUMBAR FUSION  2002   LUMBAR FUSION  11/14/2021   Dr Sharolyn Douglas   LUMBAR FUSION  12/14/2021   Dr Malachy Chamber   SPINAL FUSION  2002   TONSILLECTOMY AND ADENOIDECTOMY  1961   TOTAL KNEE ARTHROPLASTY Right 01/01/2020   Procedure: Right total knee arthroplasty - [place] Neighbor to Assist;  Surgeon: Kennedy Bucker, MD;  Location: [facility] ORS;  Service: Orthopedics;  Laterality: Right;   TOTAL KNEE ARTHROPLASTY Left 10/12/2020   Procedure: TOTAL KNEE ARTHROPLASTY;  Surgeon: Kennedy Bucker, MD;  Location: [facility] ORS;  Service: Orthopedics;  Laterality: Left;   TUBAL LIGATION     UMBILICAL HERNIA REPAIR N/A 01/05/2017    Social History   Socioeconomic History   Marital status: Widowed    Spouse name: Not on file   Number of children: 3   Years of education: Not on file   Highest education level: Bachelor's degree (e.g., BA, AB, BS)  Occupational History   Not on file  Tobacco Use   Smoking status: Never   Smokeless tobacco: Never  Vaping Use   Vaping Use: Never used  Substance and Sexual Activity   Alcohol use: Not Currently   Drug use: Never   Sexual activity: Not Currently   Other Topics Concern   Not on file  Social History Narrative   Moved from [place] 2020, lives near daughter. Retired. Enjoys walking. Lives alone   Social Determinants of Health   Financial Resource Strain: Low Risk  (08/26/2021)   Overall Financial Resource Strain (CARDIA)    Difficulty of Paying Living Expenses: Not hard at all  Food Insecurity: No Food Insecurity (04/25/2022)   Hunger Vital Sign    Worried About Running Out of Food in the Last Year: Never true    Ran Out of Food in the Last Year: Never true  Transportation Needs: No Transportation Needs (04/25/2022)   PRAPARE - Administrator, Civil Service (Medical): No    Lack of Transportation (Non-Medical): No  Physical Activity: Inactive (08/26/2021)   Exercise Vital Sign  Days of Exercise per Week: 0 days    Minutes of Exercise per Session: 0 min  Stress: No Stress Concern Present (08/26/2021)   [employer] of Occupational Health - Occupational Stress Questionnaire    Feeling of Stress : Not at all  Social Connections: Moderately Integrated (08/26/2021)   Social Connection and Isolation Panel [NHANES]    Frequency of Communication with Friends and Family: More than three times a week    Frequency of Social Gatherings with Friends and Family: Three times a week    Attends Religious Services: More than 4 times per year    Active Member of Clubs or Organizations: Yes    Attends Banker Meetings: More than 4 times per year    Marital Status: Widowed  Intimate Partner Violence: Not At Risk (04/21/2022)   Humiliation, Afraid, Rape, and Kick questionnaire    Fear of Current or Ex-Partner: No    Emotionally Abused: No    Physically Abused: No    Sexually Abused: No     Family History  Problem Relation Age of Onset   Alcohol abuse Mother    Cancer Mother    Stroke Mother    Alcohol abuse Father    Arthritis Sister    Arthritis Daughter    COPD Daughter    Arthritis Paternal Grandmother     Asthma Paternal Grandmother    COPD Paternal Grandmother    Breast cancer Paternal Aunt      Current Outpatient Medications:    Acetaminophen (TYLENOL PO), Take 650 mg by mouth 5 (five) times daily., Disp: , Rfl:    albuterol (VENTOLIN HFA) 108 (90 Base) MCG/ACT inhaler, Inhale 2 puffs into the lungs every 6 (six) hours as needed for wheezing or shortness of breath., Disp: 2 each, Rfl: 2   amitriptyline (ELAVIL) 25 MG tablet, TAKE 1 TABLET AT BEDTIME, Disp: 90 tablet, Rfl: 1   amLODipine (NORVASC) 5 MG tablet, Take 1 tablet (5 mg total) by mouth daily., Disp: 1 tablet, Rfl: 0   aspirin 81 MG EC tablet, Take 81 mg by mouth at bedtime. , Disp: , Rfl:    bisacodyl (DULCOLAX) 5 MG EC tablet, Take 1-2 tablets (5-10 mg total) by mouth daily as needed for moderate constipation., Disp: 30 tablet, Rfl: 0   calcium carbonate (TUMS EX) 750 MG chewable tablet, Chew 3 tablets by mouth daily., Disp: , Rfl:    CALCIUM-VITAMIN D-VITAMIN K PO, Take 750 mg by mouth daily., Disp: , Rfl:    celecoxib (CELEBREX) 200 MG capsule, TAKE 1 CAPSULE BY MOUTH TWICE A DAY, Disp: 60 capsule, Rfl: 0   Cholecalciferol (VITAMIN D3) 50 MCG (2000 UT) TABS, Take 2,000 Units by mouth daily., Disp: , Rfl:    Cyanocobalamin (VITAMIN B-12) 1000 MCG SUBL, Take 2,000 mcg by mouth daily., Disp: , Rfl:    EPINEPHrine 0.3 mg/0.3 mL IJ SOAJ injection, Inject 0.3 mg into the muscle as needed for anaphylaxis., Disp: , Rfl:    fluticasone (FLONASE) 50 MCG/ACT nasal spray, Place 2 sprays into both nostrils daily. qhs, Disp: , Rfl:    hydrochlorothiazide (HYDRODIURIL) 25 MG tablet, TAKE 1 TABLET DAILY, Disp: 90 tablet, Rfl: 1   Melatonin 10 MG CAPS, Take 1 capsule by mouth as needed., Disp: , Rfl:    methocarbamol (ROBAXIN) 500 MG tablet, Take 1 tablet (500 mg total) by mouth every 6 (six) hours as needed for muscle spasms., Disp: 120 tablet, Rfl: 0   omeprazole (PRILOSEC) 40 MG capsule, Take  1 capsule (40 mg total) by mouth 2 (two) times daily  before a meal., Disp: 180 capsule, Rfl: 0   polyethylene glycol (MIRALAX / GLYCOLAX) 17 g packet, Take 1 packet by mouth daily. , Disp: , Rfl:    pregabalin (LYRICA) 300 MG capsule, Take 300 mg by mouth 2 (two) times daily. 150 mg in am and 300 mg at bedtime, Disp: , Rfl:    rOPINIRole (REQUIP) 1 MG tablet, Take 1 mg by mouth in the morning and at bedtime. 1 in am and 1 at 3pm, Disp: , Rfl:    rOPINIRole (REQUIP) 4 MG tablet, TAKE 1 TABLET AT BEDTIME, Disp: 90 tablet, Rfl: 3   simvastatin (ZOCOR) 20 MG tablet, TAKE 1 TABLET AT BEDTIME, Disp: 90 tablet, Rfl: 3   tiZANidine (ZANAFLEX) 4 MG tablet, Take 4 mg by mouth 3 (three) times daily., Disp: , Rfl:    Physical exam:  Vitals:   06/16/22 1323  BP: (!) 140/68  Pulse: 80  Resp: 18  Temp: (!) 96.4 F (35.8 C)  TempSrc: Tympanic  SpO2: 99%  Weight: 172 lb (78 kg)  Height: 4\' 11"  (1.499 m)   Physical Exam Cardiovascular:     Rate and Rhythm: Normal rate and regular rhythm.     Heart sounds: Normal heart sounds.  Pulmonary:     Effort: Pulmonary effort is normal.     Breath sounds: Normal breath sounds.  Abdominal:     General: Bowel sounds are normal.     Palpations: Abdomen is soft.  Skin:    General: Skin is warm and dry.  Neurological:     Mental Status: She is alert and oriented to person, place, and time.           Latest Ref Rng & Units 06/12/2022   10:53 AM  CMP  Potassium 3.5 - 5.1 mmol/L 3.6       Latest Ref Rng & Units 06/08/2022    3:17 PM  CBC  WBC 3.4 - 10.8 x10E3/uL 8.2   Hemoglobin 11.1 - 15.9 g/dL 9.6   Hematocrit 16.1 - 46.6 % 30.7   Platelets 150 - 450 x10E3/uL 415     No images are attached to the encounter.  DG Lumbar Spine 2-3 Views  Result Date: 06/07/2022 CLINICAL DATA:  History of spinal fusion with persistent back pain, initial encounter EXAM: LUMBAR SPINE - 2-3 VIEW COMPARISON:  05/02/2022 FINDINGS: Postsurgical changes are again noted from T10 extending into the sacrum. No hardware  failure is noted at this time. The overall appearance is similar to that seen on the prior study. No soft tissue abnormality is noted. Mild retained fecal material is seen. IMPRESSION: Extensive postoperative changes in the thoracolumbar spine. No new focal abnormality is seen. Electronically Signed   By: Alcide Clever M.D.   On: 06/07/2022 19:27   DG Thoracic Spine 2 View  Result Date: 06/07/2022 CLINICAL DATA:  Central back pain, no known injury, initial encounter EXAM: THORACIC SPINE 2 VIEWS COMPARISON:  05/02/2022 FINDINGS: Postsurgical changes are again noted extending from T10 to L4 on the frontal film. Multilevel osteophytic changes are seen. No acute compression deformity is noted. Stable T12 and L1 compression fractures are seen. No paraspinal mass is seen. IMPRESSION: Postsurgical and degenerative changes. Chronic T12 and L1 fractures. Electronically Signed   By: Alcide Clever M.D.   On: 06/07/2022 19:25    Assessment and plan- Patient is a 81 y.o. female referred for iron deficiency anemia  Discussed results  of blood work which clearly show evidence of iron deficiency anemia.  Patient has been taking oral iron but has not had any significant benefit and we will proceed with IV iron either Feraheme x 2 or Venofer x 5 at this time.  Discussed risk and benefits of IV iron including all but not limited to possible risk of infusion reaction.  Patient understands and agrees to proceed as planned.  Etiology of iron deficiency anemia is currently unclear.  Patient had EGD done yesterday which showed nonspecific inflammation in the gastric antrum which was biopsied.  No evidence of active bleeding.  Esophagus GE junction duodenum and other parts of the stomach otherwise appeared normal.  We discussed getting a complete GI workup at this time including a colonoscopy with or without capsule endoscopy versus watchful monitoring.  Patient would like to opt for watchful monitoring.  CBC ferritin and iron  studies and 2 months followed by virtual visit   Thank you for this kind referral and the opportunity to participate in the care of this patient   Visit Diagnosis 1. Iron deficiency anemia, unspecified iron deficiency anemia type     Dr. Owens Shark, MD, MPH [facility] [phone] 06/16/2022

## 2022-06-16 NOTE — Progress Notes (Signed)
Patient states that she has had low iron for 1 year or more, had surgery 04/16/22 and had 2 units of blood, she can not take fusion plus it hurts her stomach and make her nauseous.

## 2022-06-19 LAB — SURGICAL PATHOLOGY

## 2022-06-20 ENCOUNTER — Encounter: Payer: Self-pay | Admitting: Gastroenterology

## 2022-06-20 DIAGNOSIS — M6281 Muscle weakness (generalized): Secondary | ICD-10-CM | POA: Diagnosis not present

## 2022-06-20 DIAGNOSIS — M4014 Other secondary kyphosis, thoracic region: Secondary | ICD-10-CM | POA: Diagnosis not present

## 2022-06-20 DIAGNOSIS — G4733 Obstructive sleep apnea (adult) (pediatric): Secondary | ICD-10-CM | POA: Diagnosis not present

## 2022-06-20 DIAGNOSIS — R1011 Right upper quadrant pain: Secondary | ICD-10-CM

## 2022-06-20 DIAGNOSIS — M4854XD Collapsed vertebra, not elsewhere classified, thoracic region, subsequent encounter for fracture with routine healing: Secondary | ICD-10-CM | POA: Diagnosis not present

## 2022-06-20 DIAGNOSIS — Z981 Arthrodesis status: Secondary | ICD-10-CM | POA: Diagnosis not present

## 2022-06-20 DIAGNOSIS — K269 Duodenal ulcer, unspecified as acute or chronic, without hemorrhage or perforation: Secondary | ICD-10-CM

## 2022-06-20 DIAGNOSIS — R140 Abdominal distension (gaseous): Secondary | ICD-10-CM

## 2022-06-20 DIAGNOSIS — K219 Gastro-esophageal reflux disease without esophagitis: Secondary | ICD-10-CM

## 2022-06-20 DIAGNOSIS — M4324 Fusion of spine, thoracic region: Secondary | ICD-10-CM | POA: Diagnosis not present

## 2022-06-20 DIAGNOSIS — Z96653 Presence of artificial knee joint, bilateral: Secondary | ICD-10-CM | POA: Diagnosis not present

## 2022-06-20 DIAGNOSIS — M960 Pseudarthrosis after fusion or arthrodesis: Secondary | ICD-10-CM | POA: Diagnosis not present

## 2022-06-20 DIAGNOSIS — M532X4 Spinal instabilities, thoracic region: Secondary | ICD-10-CM | POA: Diagnosis not present

## 2022-06-23 ENCOUNTER — Other Ambulatory Visit: Payer: Self-pay | Admitting: Oncology

## 2022-06-26 ENCOUNTER — Inpatient Hospital Stay: Payer: Medicare Other

## 2022-06-26 VITALS — BP 120/57 | HR 80 | Temp 98.4°F | Resp 17

## 2022-06-26 DIAGNOSIS — D509 Iron deficiency anemia, unspecified: Secondary | ICD-10-CM | POA: Diagnosis not present

## 2022-06-26 DIAGNOSIS — Z79899 Other long term (current) drug therapy: Secondary | ICD-10-CM | POA: Diagnosis not present

## 2022-06-26 DIAGNOSIS — D508 Other iron deficiency anemias: Secondary | ICD-10-CM

## 2022-06-26 MED ORDER — SODIUM CHLORIDE 0.9 % IV SOLN
200.0000 mg | INTRAVENOUS | Status: DC
  Administered 2022-06-26: 200 mg via INTRAVENOUS
  Filled 2022-06-26: qty 200

## 2022-06-26 MED ORDER — SODIUM CHLORIDE 0.9 % IV SOLN
Freq: Once | INTRAVENOUS | Status: AC
  Filled 2022-06-26: qty 250

## 2022-06-26 MED ORDER — ONDANSETRON HCL 4 MG/2ML IJ SOLN
4.0000 mg | Freq: Once | INTRAMUSCULAR | Status: AC
  Administered 2022-06-26: 4 mg via INTRAVENOUS
  Filled 2022-06-26: qty 2

## 2022-06-26 NOTE — Progress Notes (Signed)
1338: Pt reports feeling "light-headed/foggy headed and Nauseous" that started about 10 minutes ago", post Venofer completion. NS started to Gravity.  Consuello Masse NP aware. Pt laid back per pt request.  1341: Pt reports that laying back has helped with light-headedness/foggy feeling but still nauseous.  Consuello Masse NP at chairside.  1342: Per Consuello Masse NP give 4 mg IV Zofran once.  Consuello Masse NP advised pt to take Claritin and Zofran at home prior to next infusion, Pt agrees and verbalizes understanding.  Per Consuello Masse NP finished the remainder of the 250cc bag of NS.  1402: Pt reports back at baseline and requests to sit back up.   1419: IVFs completed. VS stable. Pt denies any complaints at this time. Per Consuello Masse NP okay to discharge home. Pt educated when to seek emergency care/when to call clinic. Pt verbalizes understanding.  1425 : Pt and VS stable at discharge.

## 2022-06-27 DIAGNOSIS — M4325 Fusion of spine, thoracolumbar region: Secondary | ICD-10-CM | POA: Diagnosis not present

## 2022-06-27 DIAGNOSIS — R293 Abnormal posture: Secondary | ICD-10-CM | POA: Diagnosis not present

## 2022-06-27 DIAGNOSIS — M5459 Other low back pain: Secondary | ICD-10-CM | POA: Diagnosis not present

## 2022-06-27 DIAGNOSIS — Z96652 Presence of left artificial knee joint: Secondary | ICD-10-CM | POA: Diagnosis not present

## 2022-06-28 ENCOUNTER — Inpatient Hospital Stay: Payer: Medicare Other | Attending: Oncology

## 2022-06-28 VITALS — BP 137/55 | HR 76 | Temp 98.1°F | Resp 18

## 2022-06-28 DIAGNOSIS — D508 Other iron deficiency anemias: Secondary | ICD-10-CM

## 2022-06-28 DIAGNOSIS — D509 Iron deficiency anemia, unspecified: Secondary | ICD-10-CM | POA: Diagnosis not present

## 2022-06-28 MED ORDER — SODIUM CHLORIDE 0.9 % IV SOLN
200.0000 mg | INTRAVENOUS | Status: DC
  Administered 2022-06-28: 200 mg via INTRAVENOUS
  Filled 2022-06-28: qty 200

## 2022-06-28 MED ORDER — SODIUM CHLORIDE 0.9 % IV SOLN
Freq: Once | INTRAVENOUS | Status: AC
  Filled 2022-06-28: qty 250

## 2022-06-28 NOTE — Progress Notes (Signed)
pt was here last week and at end of venofer felt sob/discomfort- pt seen by lauren- told to take Claritin and Zofran at home prior to today- which she did. but right at end again today seh just had some sob-- vs 122/55- hr 77- sat 99- she was already DOE with activity prior.Marland KitchenMarland KitchenMade Dr Smith Robert aware- and agreed to slow down for next infusion Pharmacy made aware and note put in order. Pt aware and verbalized understanding.

## 2022-06-28 NOTE — Patient Instructions (Signed)

## 2022-06-30 ENCOUNTER — Inpatient Hospital Stay: Payer: Medicare Other

## 2022-06-30 ENCOUNTER — Other Ambulatory Visit: Payer: Self-pay | Admitting: Oncology

## 2022-06-30 ENCOUNTER — Other Ambulatory Visit: Payer: Self-pay | Admitting: *Deleted

## 2022-06-30 VITALS — BP 123/53 | HR 66 | Temp 97.9°F | Resp 16

## 2022-06-30 DIAGNOSIS — D508 Other iron deficiency anemias: Secondary | ICD-10-CM

## 2022-06-30 DIAGNOSIS — D509 Iron deficiency anemia, unspecified: Secondary | ICD-10-CM | POA: Diagnosis not present

## 2022-06-30 MED ORDER — SODIUM CHLORIDE 0.9 % IV SOLN
Freq: Once | INTRAVENOUS | Status: AC
  Filled 2022-06-30: qty 250

## 2022-06-30 MED ORDER — SODIUM CHLORIDE 0.9 % IV SOLN
200.0000 mg | INTRAVENOUS | Status: DC
  Administered 2022-06-30: 200 mg via INTRAVENOUS
  Filled 2022-06-30: qty 10

## 2022-06-30 MED ORDER — LORATADINE 10 MG PO TABS
10.0000 mg | ORAL_TABLET | Freq: Every day | ORAL | Status: DC
  Administered 2022-06-30: 10 mg via ORAL
  Filled 2022-06-30: qty 1

## 2022-06-30 MED ORDER — ONDANSETRON HCL 4 MG PO TABS
4.0000 mg | ORAL_TABLET | Freq: Once | ORAL | Status: DC
  Filled 2022-06-30: qty 1

## 2022-06-30 MED ORDER — ONDANSETRON 4 MG PO TBDP
4.0000 mg | ORAL_TABLET | Freq: Once | ORAL | Status: AC
  Administered 2022-06-30: 4 mg via ORAL
  Filled 2022-06-30: qty 1

## 2022-06-30 NOTE — Patient Instructions (Signed)

## 2022-07-03 ENCOUNTER — Other Ambulatory Visit: Payer: Self-pay

## 2022-07-03 ENCOUNTER — Telehealth: Payer: Self-pay

## 2022-07-03 ENCOUNTER — Inpatient Hospital Stay: Payer: Medicare Other

## 2022-07-03 VITALS — BP 143/70 | HR 70 | Temp 97.9°F | Resp 18

## 2022-07-03 DIAGNOSIS — D508 Other iron deficiency anemias: Secondary | ICD-10-CM

## 2022-07-03 DIAGNOSIS — D509 Iron deficiency anemia, unspecified: Secondary | ICD-10-CM | POA: Diagnosis not present

## 2022-07-03 MED ORDER — SODIUM CHLORIDE 0.9 % IV SOLN
Freq: Once | INTRAVENOUS | Status: AC
  Filled 2022-07-03: qty 250

## 2022-07-03 MED ORDER — SODIUM CHLORIDE 0.9 % IV SOLN
200.0000 mg | INTRAVENOUS | Status: DC
  Administered 2022-07-03: 200 mg via INTRAVENOUS
  Filled 2022-07-03: qty 200

## 2022-07-03 NOTE — Progress Notes (Signed)
error 

## 2022-07-03 NOTE — Patient Instructions (Signed)

## 2022-07-03 NOTE — Telephone Encounter (Signed)
She has an appt with Danielle tomorrow. She thinks they are only filling it for 30 day at a time.

## 2022-07-03 NOTE — Telephone Encounter (Signed)
We have received a refill request for Celebrex 200mg  from [facility]. Can you call her and ask if she needs a refill of this? This was last sent to the pharmacy in March.

## 2022-07-03 NOTE — Telephone Encounter (Signed)
She had a T10- pelvis fusion. T12-L1 posterior column osteotomy on 04/17/22.   Recommend she discuss with Duwayne Heck at her appointment tomorrow. May want to go down to once a day. Please let her know.

## 2022-07-04 DIAGNOSIS — R293 Abnormal posture: Secondary | ICD-10-CM | POA: Diagnosis not present

## 2022-07-04 DIAGNOSIS — Z96652 Presence of left artificial knee joint: Secondary | ICD-10-CM | POA: Diagnosis not present

## 2022-07-04 DIAGNOSIS — M5459 Other low back pain: Secondary | ICD-10-CM | POA: Diagnosis not present

## 2022-07-04 DIAGNOSIS — M4325 Fusion of spine, thoracolumbar region: Secondary | ICD-10-CM | POA: Diagnosis not present

## 2022-07-04 MED FILL — Iron Sucrose Inj 20 MG/ML (Fe Equiv): INTRAVENOUS | Qty: 10 | Status: AC

## 2022-07-04 NOTE — Telephone Encounter (Signed)
Patient has enough medication to get her through to Thursday and discuss further refills with Danielle.

## 2022-07-05 ENCOUNTER — Inpatient Hospital Stay: Payer: Medicare Other

## 2022-07-05 ENCOUNTER — Other Ambulatory Visit: Payer: Self-pay

## 2022-07-05 VITALS — BP 138/61 | HR 70 | Temp 98.7°F | Resp 18

## 2022-07-05 DIAGNOSIS — D508 Other iron deficiency anemias: Secondary | ICD-10-CM

## 2022-07-05 DIAGNOSIS — M960 Pseudarthrosis after fusion or arthrodesis: Secondary | ICD-10-CM

## 2022-07-05 DIAGNOSIS — D509 Iron deficiency anemia, unspecified: Secondary | ICD-10-CM | POA: Diagnosis not present

## 2022-07-05 MED ORDER — SODIUM CHLORIDE 0.9 % IV SOLN
Freq: Once | INTRAVENOUS | Status: AC
  Filled 2022-07-05: qty 250

## 2022-07-05 MED ORDER — SODIUM CHLORIDE 0.9 % IV SOLN
200.0000 mg | INTRAVENOUS | Status: DC
  Administered 2022-07-05: 200 mg via INTRAVENOUS
  Filled 2022-07-05: qty 200

## 2022-07-06 ENCOUNTER — Ambulatory Visit
Admission: RE | Admit: 2022-07-06 | Discharge: 2022-07-06 | Disposition: A | Discharge status: Home / Self Care | Payer: Medicare Other | Source: Ambulatory Visit | Attending: Neurosurgery | Admitting: Neurosurgery

## 2022-07-06 ENCOUNTER — Encounter: Payer: Self-pay | Admitting: Neurosurgery

## 2022-07-06 ENCOUNTER — Ambulatory Visit (INDEPENDENT_AMBULATORY_CARE_PROVIDER_SITE_OTHER): Payer: Medicare Other | Admitting: Neurosurgery

## 2022-07-06 ENCOUNTER — Ambulatory Visit
Admission: RE | Admit: 2022-07-06 | Discharge: 2022-07-06 | Disposition: A | Discharge status: Home / Self Care | Payer: Medicare Other | Attending: Neurosurgery | Admitting: Neurosurgery

## 2022-07-06 VITALS — BP 132/78 | Temp 98.1°F | Ht 59.0 in | Wt 172.0 lb

## 2022-07-06 DIAGNOSIS — M5459 Other low back pain: Secondary | ICD-10-CM | POA: Diagnosis not present

## 2022-07-06 DIAGNOSIS — M4324 Fusion of spine, thoracic region: Secondary | ICD-10-CM | POA: Diagnosis not present

## 2022-07-06 DIAGNOSIS — M4325 Fusion of spine, thoracolumbar region: Secondary | ICD-10-CM

## 2022-07-06 DIAGNOSIS — S22080D Wedge compression fracture of T11-T12 vertebra, subsequent encounter for fracture with routine healing: Secondary | ICD-10-CM

## 2022-07-06 DIAGNOSIS — M960 Pseudarthrosis after fusion or arthrodesis: Secondary | ICD-10-CM

## 2022-07-06 DIAGNOSIS — Z96652 Presence of left artificial knee joint: Secondary | ICD-10-CM | POA: Diagnosis not present

## 2022-07-06 DIAGNOSIS — M545 Low back pain, unspecified: Secondary | ICD-10-CM | POA: Diagnosis not present

## 2022-07-06 DIAGNOSIS — R293 Abnormal posture: Secondary | ICD-10-CM | POA: Diagnosis not present

## 2022-07-06 DIAGNOSIS — Z09 Encounter for follow-up examination after completed treatment for conditions other than malignant neoplasm: Secondary | ICD-10-CM

## 2022-07-06 MED ORDER — CELECOXIB 200 MG PO CAPS: 200.0000 mg | ORAL_CAPSULE | Freq: Two times a day (BID) | ORAL | 0 refills | Status: DC

## 2022-07-06 NOTE — Progress Notes (Signed)
   REFERRING PHYSICIAN:  Judy Pimple, Md [address] [place],  [place] [address]  DOS:  T10- pelvis fusion. T12-L1 posterior column osteotomy 04/17/22   HISTORY OF PRESENT ILLNESS: Erin Petersen is approximately 3 months status post thoracolumbar fusion. she is doing well.  She continues to have some midthoracic back discomfort particularly when sitting back against a chair as well as some fatigue in her back muscles towards the end of the day.  She has been doing aqua therapy however she has some concerns about the facilities she is at would like to consider switching to [facility].  She would also like to continue with Celebrex.  LOV 06/06/22  Erin Petersen is status post thoracopelvic fusion.    She is doing very well.  She is taking Celebrex and muscle relaxants for pain control.  She still has stiffness and some back discomfort but is much better than she was prior to surgery.  PHYSICAL EXAMINATION:  General: Patient is well developed, well nourished, calm, collected, and in no apparent distress.   NEUROLOGICAL:  General: In no acute distress.   Awake, alert, oriented to person, place, and time.  Pupils equal round and reactive to light.  Facial tone is symmetric.  Tongue protrusion is midline.  There is no pronator drift.   Strength:            Side Iliopsoas Quads Hamstring PF DF EHL  R 5 5 5 5 5 5   L 5 5 5 5 5 5    Incision well healed   ROS (Neurologic):  Negative except as noted above  IMAGING: 07/06/22 xrays Without evidence of complications  ASSESSMENT/PLAN:  Erin Petersen is doing well approximately 3 months after spinal fusion.  I encouraged her to continue with physical therapy and will contact [facility] to see if I can send a referral for aqua therapy there.  I have sent in a 90-day prescription of Celebrex to [facility] per her request.  she will follow up with Dr. Myer Haff in 3 months when she is about 6 months out from surgery with x-rays  prior.  She was encouraged to call the office in the interim with any questions or concerns. Advised to contact the office if any questions or concerns arise.  I spent a total of 25 minutes in both face-to-face and non-face-to-face activities for this visit on the date of this encounter.   Manning Charity PA-C Department of neurosurgery

## 2022-07-11 ENCOUNTER — Other Ambulatory Visit: Payer: Self-pay | Admitting: Neurosurgery

## 2022-07-11 DIAGNOSIS — M4325 Fusion of spine, thoracolumbar region: Secondary | ICD-10-CM

## 2022-07-14 DIAGNOSIS — M4325 Fusion of spine, thoracolumbar region: Secondary | ICD-10-CM | POA: Diagnosis not present

## 2022-07-14 DIAGNOSIS — R293 Abnormal posture: Secondary | ICD-10-CM | POA: Diagnosis not present

## 2022-07-14 DIAGNOSIS — Z96652 Presence of left artificial knee joint: Secondary | ICD-10-CM | POA: Diagnosis not present

## 2022-07-14 DIAGNOSIS — M5459 Other low back pain: Secondary | ICD-10-CM | POA: Diagnosis not present

## 2022-07-20 ENCOUNTER — Other Ambulatory Visit: Payer: Medicare Other

## 2022-07-26 ENCOUNTER — Emergency Department
Admission: EM | Admit: 2022-07-26 | Discharge: 2022-07-26 | Disposition: A | Discharge status: Home / Self Care | Payer: Medicare Other | Attending: Student in an Organized Health Care Education/Training Program | Admitting: Student in an Organized Health Care Education/Training Program

## 2022-07-26 ENCOUNTER — Encounter: Payer: Self-pay | Admitting: Emergency Medicine

## 2022-07-26 ENCOUNTER — Emergency Department: Payer: Medicare Other

## 2022-07-26 ENCOUNTER — Ambulatory Visit: Payer: Self-pay

## 2022-07-26 ENCOUNTER — Other Ambulatory Visit: Payer: Self-pay

## 2022-07-26 ENCOUNTER — Ambulatory Visit: Payer: Medicare Other | Admitting: Internal Medicine

## 2022-07-26 DIAGNOSIS — I10 Essential (primary) hypertension: Secondary | ICD-10-CM | POA: Diagnosis not present

## 2022-07-26 DIAGNOSIS — R0602 Shortness of breath: Secondary | ICD-10-CM | POA: Diagnosis not present

## 2022-07-26 DIAGNOSIS — J449 Chronic obstructive pulmonary disease, unspecified: Secondary | ICD-10-CM | POA: Insufficient documentation

## 2022-07-26 DIAGNOSIS — R110 Nausea: Secondary | ICD-10-CM

## 2022-07-26 DIAGNOSIS — R1011 Right upper quadrant pain: Secondary | ICD-10-CM | POA: Insufficient documentation

## 2022-07-26 DIAGNOSIS — I2510 Atherosclerotic heart disease of native coronary artery without angina pectoris: Secondary | ICD-10-CM | POA: Diagnosis not present

## 2022-07-26 DIAGNOSIS — R112 Nausea with vomiting, unspecified: Secondary | ICD-10-CM | POA: Insufficient documentation

## 2022-07-26 DIAGNOSIS — K760 Fatty (change of) liver, not elsewhere classified: Secondary | ICD-10-CM | POA: Diagnosis not present

## 2022-07-26 DIAGNOSIS — I700 Atherosclerosis of aorta: Secondary | ICD-10-CM | POA: Diagnosis not present

## 2022-07-26 LAB — CBC WITH DIFFERENTIAL/PLATELET
Abs Immature Granulocytes: 0.03 10*3/uL (ref 0.00–0.07)
Basophils Absolute: 0 10*3/uL (ref 0.0–0.1)
Basophils Relative: 1 %
Eosinophils Absolute: 0.1 10*3/uL (ref 0.0–0.5)
Eosinophils Relative: 1 %
HCT: 41.7 % (ref 36.0–46.0)
Hemoglobin: 12.8 g/dL (ref 12.0–15.0)
Immature Granulocytes: 1 %
Lymphocytes Relative: 22 %
Lymphs Abs: 1.4 10*3/uL (ref 0.7–4.0)
MCH: 24.3 pg — ABNORMAL LOW (ref 26.0–34.0)
MCHC: 30.7 g/dL (ref 30.0–36.0)
MCV: 79.1 fL — ABNORMAL LOW (ref 80.0–100.0)
Monocytes Absolute: 0.5 10*3/uL (ref 0.1–1.0)
Monocytes Relative: 8 %
Neutro Abs: 4.2 10*3/uL (ref 1.7–7.7)
Neutrophils Relative %: 67 %
Platelets: 334 10*3/uL (ref 150–400)
RBC: 5.27 MIL/uL — ABNORMAL HIGH (ref 3.87–5.11)
RDW: 23 % — ABNORMAL HIGH (ref 11.5–15.5)
Smear Review: NORMAL
WBC: 6.3 10*3/uL (ref 4.0–10.5)
nRBC: 0 % (ref 0.0–0.2)

## 2022-07-26 LAB — URINALYSIS, ROUTINE W REFLEX MICROSCOPIC
Bilirubin Urine: NEGATIVE
Glucose, UA: NEGATIVE mg/dL
Hgb urine dipstick: NEGATIVE
Ketones, ur: NEGATIVE mg/dL
Leukocytes,Ua: NEGATIVE
Nitrite: NEGATIVE
Protein, ur: NEGATIVE mg/dL
Specific Gravity, Urine: 1.009 (ref 1.005–1.030)
pH: 7 (ref 5.0–8.0)

## 2022-07-26 LAB — COMPREHENSIVE METABOLIC PANEL
ALT: 25 U/L (ref 0–44)
AST: 22 U/L (ref 15–41)
Albumin: 4.9 g/dL (ref 3.5–5.0)
Alkaline Phosphatase: 107 U/L (ref 38–126)
Anion gap: 8 (ref 5–15)
BUN: 19 mg/dL (ref 8–23)
CO2: 28 mmol/L (ref 22–32)
Calcium: 9.6 mg/dL (ref 8.9–10.3)
Chloride: 101 mmol/L (ref 98–111)
Creatinine, Ser: 0.7 mg/dL (ref 0.44–1.00)
GFR, Estimated: >60 mL/min (ref >60)
Glucose, Bld: 118 mg/dL — ABNORMAL HIGH (ref 70–99)
Potassium: 3.8 mmol/L (ref 3.5–5.1)
Sodium: 137 mmol/L (ref 135–145)
Total Bilirubin: 0.5 mg/dL (ref 0.3–1.2)
Total Protein: 7.5 g/dL (ref 6.5–8.1)

## 2022-07-26 LAB — TROPONIN I (HIGH SENSITIVITY)
Troponin I (High Sensitivity): 4 ng/L (ref <18)
Troponin I (High Sensitivity): 4 ng/L (ref <18)

## 2022-07-26 LAB — LIPASE, BLOOD: Lipase: 28 U/L (ref 11–51)

## 2022-07-26 MED ORDER — ONDANSETRON 4 MG PO TBDP: 4.0000 mg | ORAL_TABLET | Freq: Three times a day (TID) | ORAL | 0 refills | Status: DC | PRN

## 2022-07-26 MED ORDER — ONDANSETRON HCL 4 MG/2ML IJ SOLN
4.0000 mg | Freq: Once | INTRAMUSCULAR | Status: AC
  Administered 2022-07-26: 4 mg via INTRAVENOUS
  Filled 2022-07-26: qty 2

## 2022-07-26 MED ORDER — IOHEXOL 350 MG/ML SOLN
100.0000 mL | Freq: Once | INTRAVENOUS | Status: AC | PRN
  Administered 2022-07-26: 100 mL via INTRAVENOUS

## 2022-07-26 MED ORDER — FENTANYL CITRATE PF 50 MCG/ML IJ SOSY
50.0000 ug | PREFILLED_SYRINGE | Freq: Once | INTRAMUSCULAR | Status: AC
  Administered 2022-07-26: 50 ug via INTRAVENOUS
  Filled 2022-07-26: qty 1

## 2022-07-26 MED ORDER — SODIUM CHLORIDE 0.9 % IV BOLUS
1000.0000 mL | Freq: Once | INTRAVENOUS | Status: AC
  Administered 2022-07-26: 1000 mL via INTRAVENOUS

## 2022-07-26 MED ORDER — ONDANSETRON HCL 4 MG PO TABS: 4.0000 mg | ORAL_TABLET | Freq: Three times a day (TID) | ORAL | 0 refills | Status: DC | PRN

## 2022-07-26 NOTE — ED Notes (Signed)
See triage note; pt states hx of constipation chronically but BM's have been normal/baseline lately; states urination WDL; pt's skin dry, resp reg/unlabored and calmly laying on stretcher. Visitor at bedside.

## 2022-07-26 NOTE — ED Notes (Signed)
Pt back from restroom. Placed back on monitor. In NAD. Friend remains at bedside.

## 2022-07-26 NOTE — ED Notes (Signed)
Called lab about missing trop result. Barbarann Ehlers states it was still in a machine running another test earlier when this RN called so it hasn't been completed yet but they will run it now.

## 2022-07-26 NOTE — Chronic Care Management (AMB) (Signed)
   07/26/2022  NORENE MINCER Mar 30, 1940 [id number]   Encounter opened to change patient status from enrolled to previously enrolled.   Alto Denver RN, MSN, CCM RN Care Manager  Chronic Care Management Direct Number: [phone]

## 2022-07-26 NOTE — ED Notes (Signed)
At bedside to collect repeat trop; pt away at imaging. Will collect when pt back to room.

## 2022-07-26 NOTE — ED Provider Notes (Signed)
[facility] Provider Note    Event Date/Time   First MD Initiated Contact with Patient 07/26/22 1046     (approximate)   History   Abdominal Pain   HPI  Erin Petersen is a 82 y.o. female  with history of COPD, HTN, hyperlipidemia,CAD, and as listed in EMR presents to the emergency department for treatment and evaluation due to 4 days of right upper quadrant abdominal pain with nausea and vomiting. Pain radiates into her back. No known fever.    Physical Exam   Triage Vital Signs: ED Triage Vitals  Enc Vitals Group     BP 07/26/22 1005 (!) 186/78     Pulse Rate 07/26/22 1005 85     Resp 07/26/22 1005 20     Temp 07/26/22 1005 98.2 F (36.8 C)     Temp Source 07/26/22 1005 Oral     SpO2 07/26/22 1005 97 %     Weight --      Height --      Head Circumference --      Peak Flow --      Pain Score 07/26/22 1009 8     Pain Loc --      Pain Edu? --      Excl. in GC? --     Most recent vital signs: Vitals:   07/26/22 1426 07/26/22 1438  BP:  (!) 156/71  Pulse:  75  Resp:    Temp:    SpO2: 99% 97%    General: Awake, no distress.  CV:  Good peripheral perfusion.  Resp:  Normal effort.  Abd:  No distention. RUQ tenderness to palpation Other:     ED Results / Procedures / Treatments   Labs (all labs ordered are listed, but only abnormal results are displayed) Labs Reviewed  COMPREHENSIVE METABOLIC PANEL - Abnormal; Notable for the following components:      Result Value   Glucose, Bld 118 (*)    All other components within normal limits  CBC WITH DIFFERENTIAL/PLATELET - Abnormal; Notable for the following components:   RBC 5.27 (*)    MCV 79.1 (*)    MCH 24.3 (*)    RDW 23.0 (*)    All other components within normal limits  URINALYSIS, ROUTINE W REFLEX MICROSCOPIC - Abnormal; Notable for the following components:   Color, Urine YELLOW (*)    APPearance CLEAR (*)    All other components within normal limits  LIPASE, BLOOD   TROPONIN I (HIGH SENSITIVITY)  TROPONIN I (HIGH SENSITIVITY)     EKG  Not indicated.   RADIOLOGY  Image and radiology report reviewed and interpreted by me. Radiology report consistent with the same.  Ultrasound is negative for acute findings/concern for gallbladder disease.  PROCEDURES:  Critical Care performed: No  Procedures   MEDICATIONS ORDERED IN ED:  Medications  sodium chloride 0.9 % bolus 1,000 mL (0 mLs Intravenous Stopped 07/26/22 1237)  ondansetron (ZOFRAN) injection 4 mg (4 mg Intravenous Given 07/26/22 1150)  fentaNYL (SUBLIMAZE) injection 50 mcg (50 mcg Intravenous Given 07/26/22 1151)  iohexol (OMNIPAQUE) 350 MG/ML injection 100 mL (100 mLs Intravenous Contrast Given 07/26/22 1349)  ondansetron (ZOFRAN) injection 4 mg (4 mg Intravenous Given 07/26/22 1434)  fentaNYL (SUBLIMAZE) injection 50 mcg (50 mcg Intravenous Given 07/26/22 1435)     IMPRESSION / MDM / ASSESSMENT AND PLAN / ED COURSE   I have reviewed the triage note.  Differential diagnosis includes, but is not limited to,  acute cholecystitis; cholelithiases; dissection; bowel obstruction  Patient's presentation is most consistent with acute presentation with potential threat to life or bodily function.  82 year old female presents to the ER for evaluation of abdominal pain. Symptoms started 4 days ago.   Patient reports poor appetite and hasn't been able to tolerate anything other than saltine crackers and ginger ale. She has not had anything to eat or drink today.  Plan will be to get labs, urinalysis, EKG, and RUQ ultrasound. Fluids, zofran, and fentanyl ordered as well.  Clinical Course as of 07/26/22 1525  Wed Jul 26, 2022  1246 [place] completed. Awaiting results. Patient reports pain and nausea have resolved after medications and fluids. [CT]  1329 [place] negative. Kidney function is normal. CTA chest, abdomen, pelvis ordered since pain is in the abdomen and radiates into her back. Vital signs are  stable at this time. [CT]  1518 CTA Chest, abdomen and pelvis are all negative. Serial troponins are negative as well. Results discussed with the patient who feels reassured. She is to follow up with PCP or GI if pain continues. ER return precautions discussed. She reports that she already has some pain medication from back surgery earlier this year. She was advised to take it as prescribed. Zofran will be sent to her pharmacy. [CT]    Clinical Course User Index [CT] Princesa Willig B, FNP     FINAL CLINICAL IMPRESSION(S) / ED DIAGNOSES   Final diagnoses:  Right upper quadrant abdominal pain  Nausea     Rx / DC Orders   ED Discharge Orders          Ordered    ondansetron (ZOFRAN-ODT) 4 MG disintegrating tablet  Every 8 hours PRN        07/26/22 1522             Note:  This document was prepared using Dragon voice recognition software and may include unintentional dictation errors.   Chinita Pester, FNP 07/26/22 1525    Willy Eddy, MD 07/26/22 [phone]

## 2022-07-26 NOTE — Discharge Instructions (Signed)
Please return to the ER for any symptom that changes or worsens.

## 2022-07-26 NOTE — ED Notes (Signed)
See triage note, pt reports generalized abd pain for 4 days with nausea.

## 2022-07-26 NOTE — ED Triage Notes (Signed)
Pt via POV from home. Pt c/o RUQ abd pain that started 4 days ago, also reports nausea. Pt has a hx of appendectomy and hysterectomy. Pt is A&Ox4 and NAD.

## 2022-07-26 NOTE — ED Notes (Addendum)
Called lab to add on trop. State they will soon. Provider CBT confirms original EKG reviewed and no need to capture repeat.

## 2022-07-28 ENCOUNTER — Ambulatory Visit
Admission: EM | Admit: 2022-07-28 | Discharge: 2022-07-28 | Disposition: A | Discharge status: Home / Self Care | Payer: Medicare Other

## 2022-07-28 ENCOUNTER — Encounter: Payer: Self-pay | Admitting: Family Medicine

## 2022-07-28 ENCOUNTER — Telehealth (INDEPENDENT_AMBULATORY_CARE_PROVIDER_SITE_OTHER): Payer: Medicare Other | Admitting: Family Medicine

## 2022-07-28 VITALS — BP 152/84 | HR 71 | Temp 97.7°F | Ht 59.0 in | Wt 172.0 lb

## 2022-07-28 DIAGNOSIS — H5789 Other specified disorders of eye and adnexa: Secondary | ICD-10-CM | POA: Insufficient documentation

## 2022-07-28 DIAGNOSIS — R600 Localized edema: Secondary | ICD-10-CM | POA: Diagnosis not present

## 2022-07-28 DIAGNOSIS — G4733 Obstructive sleep apnea (adult) (pediatric): Secondary | ICD-10-CM | POA: Diagnosis not present

## 2022-07-28 MED ORDER — PREDNISONE 20 MG PO TABS: ORAL_TABLET | ORAL | 0 refills | Status: DC

## 2022-07-28 NOTE — ED Provider Notes (Signed)
Renaldo Fiddler    CSN: [id number] Arrival date & time: 07/28/22  1510      History   Chief Complaint Chief Complaint  Patient presents with   Rash    HPI Erin Petersen is a 82 y.o. female.  Patient presents with 1 day history of bilateral eyelid swelling.  No trauma.  No fever, chills, rash, redness, bruising, eye pain, eye drainage, change in vision, or other symptoms.  Patient had a video visit with her PCP today and was prescribed prednisone for a diagnosis of bilateral eye swelling; The camera was not working for this appointment and patient states she was told to come here for in-person evaluation.  Patient was seen at [facility] ED on 07/26/2022; diagnosed with right upper quadrant abdominal pain and nausea; discharged with Zofran.  The history is provided by the patient and medical records.    Past Medical History:  Diagnosis Date   Anemia    Arthritis    Asthma    Bacteremia due to Gram-negative bacteria 06/04/2019   CAD (coronary artery disease) 07/21/2016   a.) LHC 07/21/2016: mild nonobstructive CAD   Colon polyps    Complication of anesthesia    a.) anesthesia awareness   Compression fracture of L1 lumbar vertebra (HCC)    COPD (chronic obstructive pulmonary disease) (HCC)    Diastolic dysfunction    a.) TTE 03/02/2021: EF >55%, mild LVH, mild LAE, triv MR, mild TR/PR, G1DD   Duodenal ulcer    Dyspnea    GERD (gastroesophageal reflux disease)    History of 2019 novel coronavirus disease (COVID-19) 01/19/2021   a.) tested (+) at [facility]   History of lumbar fusion    a.) T12-S1   Hyperlipidemia    Hypertension    Insomnia    a.) takes melatonin PRN   OSA on CPAP    Osteopenia    Peripheral neuropathy    Plantar fasciitis    Pneumonia    Pulmonary hypertension (HCC)    a.) TTE 06/04/2019: moderate (PASP 40.3 mmHg)   Right inguinal hernia    a.) s/p repair 12/2016   RLS (restless legs syndrome)    a.) on ropinirole   SOB (shortness of  breath)    T12 compression fracture (HCC)    Umbilical hernia    a.) s/p repair 12/2016   Urinary incontinence    Urinary tract infection    Wears hearing aid in both ears     Patient Active Problem List   Diagnosis Date Noted   Eye swelling, bilateral 07/28/2022   History of duodenal ulcer 06/15/2022   Pseudoarthrosis of thoracic spine after fusion procedure 04/17/2022   Other secondary kyphosis, thoracic region 04/17/2022   Compression fracture of T12 vertebra (HCC) 04/17/2022   Spinal instability, thoracic 04/17/2022   Abdominal wall bulge 01/31/2022   Iron deficiency anemia 01/31/2022   Constipation due to pain medication 12/28/2021   Post-operative pain 12/28/2021   Back pain at L4-L5 level 12/01/2021   Inguinal hernia of right side without obstruction or gangrene 11/15/2021   Umbilical hernia without obstruction or gangrene 11/15/2021   Coronary artery disease involving native coronary artery of native heart 10/07/2021   Other secondary scoliosis, lumbar region 10/04/2021   Postlaminectomy syndrome of lumbar region 10/04/2021   Pedal edema 07/26/2021   Mild pulmonary hypertension (HCC) 07/26/2021   Hyperlipidemia 11/17/2020   S/P TKR (total knee replacement) using cement, left 10/12/2020   Somnolence, daytime 09/09/2020   Rash 09/09/2020  Medicare annual wellness visit, subsequent 08/25/2020   Encounter for screening mammogram for breast cancer 08/25/2020   Encounter for general adult medical examination with abnormal findings 08/25/2020   GERD (gastroesophageal reflux disease) 08/25/2020   Dizzy spells 08/25/2020   Migraine 03/12/2020   IBS (irritable bowel syndrome) 03/12/2020   S/P TKR (total knee replacement) using cement, right 01/01/2020   Colonic polyp 10/22/2019   History of vaginal hysterectomy 06/11/2019   Estrogen deficiency 04/22/2019   History of skin cancer 04/22/2019   High-tone pelvic floor dysfunction 04/03/2019   IC (interstitial cystitis)  04/03/2019   Neuralgia of both pudendal nerves 04/03/2019   Pelvic pain in female 04/03/2019   Osteoporosis 02/12/2019   Asthma 01/31/2019   Adverse reaction to influenza vaccine, initial encounter 12/03/2018   Vitamin D deficiency 12/03/2018   Restless legs syndrome 11/28/2018   Small fiber neuropathy 11/28/2018   Allergic rhinitis 10/08/2018   Benign essential hypertension 10/08/2018   Chronic bladder pain 10/08/2018   Neuropathy 10/08/2018   Osteoarthritis 10/08/2018   S/P BSO (bilateral salpingo-oophorectomy) 01/07/2018   OSA on CPAP 12/29/2016   Chronic obstructive pulmonary disease (HCC) 12/29/2016   Chronic dyspnea 06/24/2012   Other and unspecified disc disorder of lumbar region 10/25/2000   Diverticulosis of colon 10/25/2000   Prediabetes 09/24/1998   Tinnitus of both ears 09/24/1983    Past Surgical History:  Procedure Laterality Date   ABDOMINAL HYSTERECTOMY  1972   APPENDECTOMY  1958   APPLICATION OF INTRAOPERATIVE CT SCAN N/A 04/17/2022   Procedure: APPLICATION OF INTRAOPERATIVE CT SCAN;  Surgeon: Venetia Night, MD;  Location: [facility] ORS;  Service: Neurosurgery;  Laterality: N/A;   BILATERAL SALPINGOOPHORECTOMY  01/07/2018   BIOPSY  04/15/2019   Procedure: BIOPSY;  Surgeon: Toney Reil, MD;  Location: [facility];  Service: Endoscopy;;   CARDIAC CATHETERIZATION Left 07/21/2016   COLONOSCOPY WITH PROPOFOL N/A 11/11/2018   Procedure: COLONOSCOPY WITH PROPOFOL;  Surgeon: Toney Reil, MD;  Location: [facility] ENDOSCOPY;  Service: Gastroenterology;  Laterality: N/A;   CYSTO WITH HYDRODISTENSION N/A 12/30/2018   Procedure: CYSTOSCOPY/HYDRODISTENSION WITH MARCAINE;  Surgeon: Alfredo Martinez, MD;  Location: [facility] ORS;  Service: Urology;  Laterality: N/A;   ESOPHAGOGASTRODUODENOSCOPY (EGD) WITH PROPOFOL N/A 04/15/2019   Procedure: ESOPHAGOGASTRODUODENOSCOPY (EGD) WITH PROPOFOL;  Surgeon: Toney Reil, MD;  Location: [facility];   Service: Endoscopy;  Laterality: N/A;  sleep apnea   ESOPHAGOGASTRODUODENOSCOPY (EGD) WITH PROPOFOL N/A 07/09/2019   Procedure: ESOPHAGOGASTRODUODENOSCOPY (EGD) WITH PROPOFOL;  Surgeon: Toney Reil, MD;  Location: [facility] ENDOSCOPY;  Service: Gastroenterology;  Laterality: N/A;   ESOPHAGOGASTRODUODENOSCOPY (EGD) WITH PROPOFOL N/A 06/15/2022   Procedure: ESOPHAGOGASTRODUODENOSCOPY (EGD) WITH PROPOFOL;  Surgeon: Toney Reil, MD;  Location: [facility];  Service: Endoscopy;  Laterality: N/A;  sleep apnea   EYE SURGERY Bilateral    INGUINAL HERNIA REPAIR Bilateral 01/05/2017   JOINT REPLACEMENT     LAPAROSCOPIC BILATERAL SALPINGO OOPHERECTOMY  2019   LUMBAR FUSION  2002   LUMBAR FUSION  11/14/2021   Dr Sharolyn Douglas   LUMBAR FUSION  12/14/2021   Dr Malachy Chamber   SPINAL FUSION  2002   TONSILLECTOMY AND ADENOIDECTOMY  1961   TOTAL KNEE ARTHROPLASTY Right 01/01/2020   Procedure: Right total knee arthroplasty - [place] Neighbor to Assist;  Surgeon: Kennedy Bucker, MD;  Location: [facility] ORS;  Service: Orthopedics;  Laterality: Right;   TOTAL KNEE ARTHROPLASTY Left 10/12/2020   Procedure: TOTAL KNEE ARTHROPLASTY;  Surgeon: Kennedy Bucker, MD;  Location: [facility]  ORS;  Service: Orthopedics;  Laterality: Left;   TUBAL LIGATION     UMBILICAL HERNIA REPAIR N/A 01/05/2017    OB History   No obstetric history on file.      Home Medications    Prior to Admission medications   Medication Sig Start Date End Date Taking? Authorizing Provider  Acetaminophen (TYLENOL PO) Take 650 mg by mouth 5 (five) times daily.    [provider]  albuterol (VENTOLIN HFA) 108 (90 Base) MCG/ACT inhaler Inhale 2 puffs into the lungs every 6 (six) hours as needed for wheezing or shortness of breath. 02/24/21   McDonough, Salomon Fick, PA-C  amitriptyline (ELAVIL) 25 MG tablet TAKE 1 TABLET AT BEDTIME 06/13/22   Tower, Blackgum A, MD  amLODipine (NORVASC) 5 MG tablet Take 1 tablet (5 mg total) by mouth daily.  03/14/21   Tower, Audrie Gallus, MD  aspirin 81 MG EC tablet Take 81 mg by mouth at bedtime.     [provider]  bisacodyl (DULCOLAX) 5 MG EC tablet Take 1-2 tablets (5-10 mg total) by mouth daily as needed for moderate constipation. 04/23/22   Venetia Night, MD  calcium carbonate (TUMS EX) 750 MG chewable tablet Chew 3 tablets by mouth daily.    [provider]  CALCIUM-VITAMIN D-VITAMIN K PO Take 750 mg by mouth daily.    [provider]  celecoxib (CELEBREX) 200 MG capsule Take 1 capsule (200 mg total) by mouth 2 (two) times daily. 07/06/22   Susanne Borders, PA  Cholecalciferol (VITAMIN D3) 50 MCG (2000 UT) TABS Take 2,000 Units by mouth daily.    [provider]  Cyanocobalamin (VITAMIN B-12) 1000 MCG SUBL Take 2,000 mcg by mouth daily.    [provider]  EPINEPHrine 0.3 mg/0.3 mL IJ SOAJ injection Inject 0.3 mg into the muscle as needed for anaphylaxis.    [provider]  fluticasone (FLONASE) 50 MCG/ACT nasal spray Place 2 sprays into both nostrils daily. qhs    [provider]  hydrochlorothiazide (HYDRODIURIL) 25 MG tablet TAKE 1 TABLET DAILY 03/28/22   Tower, Audrie Gallus, MD  Melatonin 10 MG CAPS Take 1 capsule by mouth as needed.    [provider]  methocarbamol (ROBAXIN) 500 MG tablet Take 1 tablet (500 mg total) by mouth every 6 (six) hours as needed for muscle spasms. 04/23/22   Venetia Night, MD  omeprazole (PRILOSEC) 40 MG capsule Take 1 capsule (40 mg total) by mouth 2 (two) times daily before a meal. 06/06/22   Vanga, Loel Dubonnet, MD  ondansetron (ZOFRAN-ODT) 4 MG disintegrating tablet Take 1 tablet (4 mg total) by mouth every 8 (eight) hours as needed for nausea or vomiting. 07/26/22   Triplett, Cari B, FNP  polyethylene glycol (MIRALAX / GLYCOLAX) 17 g packet Take 1 packet by mouth daily.     [provider]  predniSONE (DELTASONE) 20 MG tablet 3 tabs by mouth daily x 3 days, then 2 tabs by mouth daily  x 2 days then 1 tab by mouth daily x 2 days 07/28/22   Ermalene Searing, Amy E, MD  pregabalin (LYRICA) 300 MG capsule Take 300 mg by mouth 2 (two) times daily. 150 mg in am and 300 mg at bedtime 12/11/21   [provider]  rOPINIRole (REQUIP) 1 MG tablet Take 1 mg by mouth in the morning and at bedtime. 1 in am and 1 at 3pm    [provider]  rOPINIRole (REQUIP) 4 MG tablet TAKE 1  TABLET AT BEDTIME 03/08/21   Tower, Audrie Gallus, MD  simvastatin (ZOCOR) 20 MG tablet TAKE 1 TABLET AT BEDTIME 08/15/21   Tower, Audrie Gallus, MD  tiZANidine (ZANAFLEX) 4 MG tablet Take 4 mg by mouth 3 (three) times daily. 06/12/22   [provider]    Family History Family History  Problem Relation Age of Onset   Alcohol abuse Mother    Cancer Mother    Stroke Mother    Alcohol abuse Father    Arthritis Sister    Arthritis Daughter    COPD Daughter    Arthritis Paternal Grandmother    Asthma Paternal Grandmother    COPD Paternal Grandmother    Breast cancer Paternal Aunt     Social History Social History   Tobacco Use   Smoking status: Never   Smokeless tobacco: Never  Vaping Use   Vaping Use: Never used  Substance Use Topics   Alcohol use: Not Currently   Drug use: Never     Allergies   Bee venom, Fosamax [alendronate], Fusion plus [iron-fa-b cmp-c-biot-probiotic], Ciprofloxacin hcl, and Sulfa antibiotics   Review of Systems Review of Systems  Constitutional:  Negative for chills and fever.  HENT:  Negative for sore throat, trouble swallowing and voice change.   Eyes:  Negative for pain, discharge, redness, itching and visual disturbance.  Respiratory:  Negative for cough and shortness of breath.   Cardiovascular:  Negative for chest pain and palpitations.  Skin:  Negative for color change, rash and wound.     Physical Exam Triage Vital Signs ED Triage Vitals  Enc Vitals Group     BP 07/28/22 1542 (!) 147/81     Pulse Rate 07/28/22 1536 78     Resp 07/28/22 1536 18      Temp 07/28/22 1536 97.7 F (36.5 C)     Temp src --      SpO2 07/28/22 1536 95 %     Weight --      Height --      Head Circumference --      Peak Flow --      Pain Score 07/28/22 1538 0     Pain Loc --      Pain Edu? --      Excl. in GC? --    No data found.  Updated Vital Signs BP (!) 147/81   Pulse 78   Temp 97.7 F (36.5 C)   Resp 18   LMP  (LMP Unknown)   SpO2 95%   Visual Acuity Right Eye Distance:   Left Eye Distance:   Bilateral Distance:    Right Eye Near:   Left Eye Near:    Bilateral Near:     Physical Exam Vitals and nursing note reviewed.  Constitutional:      General: She is not in acute distress.    Appearance: Normal appearance. She is well-developed. She is not ill-appearing.  HENT:     Right Ear: Tympanic membrane normal.     Left Ear: Tympanic membrane normal.     Nose: Nose normal.     Mouth/Throat:     Mouth: Mucous membranes are moist.     Pharynx: Oropharynx is clear.  Eyes:     General: Vision grossly intact.        Right eye: No discharge.        Left eye: No discharge.     Extraocular Movements: Extraocular movements intact.     Conjunctiva/sclera: Conjunctivae normal.  Right eye: Right conjunctiva is not injected.     Left eye: Left conjunctiva is not injected.     Pupils: Pupils are equal, round, and reactive to light.     Comments: Mild bilateral upper and lower eyelid edema.  No erythema or wounds.  No eye drainage.  Cardiovascular:     Rate and Rhythm: Normal rate and regular rhythm.     Heart sounds: Normal heart sounds.  Pulmonary:     Effort: Pulmonary effort is normal. No respiratory distress.     Breath sounds: Normal breath sounds.  Musculoskeletal:     Cervical back: Neck supple.  Skin:    General: Skin is warm and dry.  Neurological:     Mental Status: She is alert.  Psychiatric:        Mood and Affect: Mood normal.        Behavior: Behavior normal.      UC Treatments / Results  Labs (all labs  ordered are listed, but only abnormal results are displayed) Labs Reviewed - No data to display  EKG   Radiology No results found.  Procedures Procedures (including critical care time)  Medications Ordered in UC Medications - No data to display  Initial Impression / Assessment and Plan / UC Course  I have reviewed the triage vital signs and the nursing notes.  Pertinent labs & imaging results that were available during my care of the patient were reviewed by me and considered in my medical decision making (see chart for details).    Mild periorbital edema of both eyes.  Afebrile and vital signs are stable.  No indication of infection.  Instructed patient to take the prednisone as directed by her doctor.  Also discussed Allegra or Zyrtec.  Instructed patient to follow up with her PCP if her symptoms are not improving.  She agrees to plan of care.    Final Clinical Impressions(s) / UC Diagnoses   Final diagnoses:  Periorbital edema of both eyes     Discharge Instructions      Take the prednisone as directed by your doctor.  Take Allegra or Zyrtec also.    Follow up with your primary care provider if your symptoms are not improving.        ED Prescriptions   None    PDMP not reviewed this encounter.   Mickie Bail, NP 07/28/22 [phone]

## 2022-07-28 NOTE — Progress Notes (Signed)
VIRTUAL VISIT A virtual visit is felt to be most appropriate for this patient at this time.   I connected with the patient on 07/28/22 at  2:00 PM EDT by virtual telehealth platform and verified that I am speaking with the correct person using two identifiers.   I discussed the limitations, risks, security and privacy concerns of performing an evaluation and management service by  virtual telehealth platform and the availability of in person appointments. I also discussed with the patient that there may be a patient responsible charge related to this service. The patient expressed understanding and agreed to proceed.  Patient location: Home Provider Location: [place] [facility] Participants: Kerby Nora and [name]   Chief Complaint  Patient presents with   Blister    Around eyes and cheek after having a ct scan and spreading.    History of Present Illness:  82 y.o. female patient of [place], Audrie Gallus, MD presents with  new onset rash around eye   She reports recent CT ANGIO chest/abdomen/pelvis scan 5/29 evaluation for  upper abdominal pain and to rule out dissection. Given fentanyl, zofran  Woke up yesterday morning , under eyebrow noted blisters, patches on upper cheek, clear fluid.  Spreading down cheek.   No fever, no swelling in oral airway.   No redness, no itching.   She has no further abdominal pain.   No flu like symptoms.     COVID 19 screen No recent travel or known exposure to COVID19 The patient denies respiratory symptoms of COVID 19 at this time.  The importance of social distancing was discussed today.   Review of Systems  Constitutional:  Negative for chills and fever.  HENT:  Negative for congestion and ear pain.   Eyes:  Negative for pain and redness.  Respiratory:  Negative for cough and shortness of breath.   Cardiovascular:  Negative for chest pain, palpitations and leg swelling.  Gastrointestinal:  Negative for abdominal pain, blood  in stool, constipation, diarrhea, nausea and vomiting.  Genitourinary:  Negative for dysuria.  Musculoskeletal:  Negative for falls and myalgias.  Skin:  Negative for rash.  Neurological:  Negative for dizziness.  Psychiatric/Behavioral:  Negative for depression. The patient is not nervous/anxious.       Past Medical History:  Diagnosis Date   Anemia    Arthritis    Asthma    Bacteremia due to Gram-negative bacteria 06/04/2019   CAD (coronary artery disease) 07/21/2016   a.) LHC 07/21/2016: mild nonobstructive CAD   Colon polyps    Complication of anesthesia    a.) anesthesia awareness   Compression fracture of L1 lumbar vertebra (HCC)    COPD (chronic obstructive pulmonary disease) (HCC)    Diastolic dysfunction    a.) TTE 03/02/2021: EF >55%, mild LVH, mild LAE, triv MR, mild TR/PR, G1DD   Duodenal ulcer    Dyspnea    GERD (gastroesophageal reflux disease)    History of 2019 novel coronavirus disease (COVID-19) 01/19/2021   a.) tested (+) at [facility]   History of lumbar fusion    a.) T12-S1   Hyperlipidemia    Hypertension    Insomnia    a.) takes melatonin PRN   OSA on CPAP    Osteopenia    Peripheral neuropathy    Plantar fasciitis    Pneumonia    Pulmonary hypertension (HCC)    a.) TTE 06/04/2019: moderate (PASP 40.3 mmHg)   Right inguinal hernia    a.) s/p  repair 12/2016   RLS (restless legs syndrome)    a.) on ropinirole   SOB (shortness of breath)    T12 compression fracture (HCC)    Umbilical hernia    a.) s/p repair 12/2016   Urinary incontinence    Urinary tract infection    Wears hearing aid in both ears     reports that she has never smoked. She has never used smokeless tobacco. She reports that she does not currently use alcohol. She reports that she does not use drugs.   Current Outpatient Medications:    albuterol (VENTOLIN HFA) 108 (90 Base) MCG/ACT inhaler, Inhale 2 puffs into the lungs every 6 (six) hours as needed for wheezing or shortness  of breath., Disp: 2 each, Rfl: 2   amitriptyline (ELAVIL) 25 MG tablet, TAKE 1 TABLET AT BEDTIME, Disp: 90 tablet, Rfl: 1   amLODipine (NORVASC) 5 MG tablet, Take 1 tablet (5 mg total) by mouth daily., Disp: 1 tablet, Rfl: 0   aspirin 81 MG EC tablet, Take 81 mg by mouth at bedtime. , Disp: , Rfl:    bisacodyl (DULCOLAX) 5 MG EC tablet, Take 1-2 tablets (5-10 mg total) by mouth daily as needed for moderate constipation., Disp: 30 tablet, Rfl: 0   calcium carbonate (TUMS EX) 750 MG chewable tablet, Chew 3 tablets by mouth daily., Disp: , Rfl:    CALCIUM-VITAMIN D-VITAMIN K PO, Take 750 mg by mouth daily., Disp: , Rfl:    celecoxib (CELEBREX) 200 MG capsule, Take 1 capsule (200 mg total) by mouth 2 (two) times daily., Disp: 180 capsule, Rfl: 0   Cholecalciferol (VITAMIN D3) 50 MCG (2000 UT) TABS, Take 2,000 Units by mouth daily., Disp: , Rfl:    Cyanocobalamin (VITAMIN B-12) 1000 MCG SUBL, Take 2,000 mcg by mouth daily., Disp: , Rfl:    EPINEPHrine 0.3 mg/0.3 mL IJ SOAJ injection, Inject 0.3 mg into the muscle as needed for anaphylaxis., Disp: , Rfl:    fluticasone (FLONASE) 50 MCG/ACT nasal spray, Place 2 sprays into both nostrils daily. qhs, Disp: , Rfl:    hydrochlorothiazide (HYDRODIURIL) 25 MG tablet, TAKE 1 TABLET DAILY, Disp: 90 tablet, Rfl: 1   Melatonin 10 MG CAPS, Take 1 capsule by mouth as needed., Disp: , Rfl:    methocarbamol (ROBAXIN) 500 MG tablet, Take 1 tablet (500 mg total) by mouth every 6 (six) hours as needed for muscle spasms., Disp: 120 tablet, Rfl: 0   omeprazole (PRILOSEC) 40 MG capsule, Take 1 capsule (40 mg total) by mouth 2 (two) times daily before a meal., Disp: 180 capsule, Rfl: 0   ondansetron (ZOFRAN-ODT) 4 MG disintegrating tablet, Take 1 tablet (4 mg total) by mouth every 8 (eight) hours as needed for nausea or vomiting., Disp: 20 tablet, Rfl: 0   polyethylene glycol (MIRALAX / GLYCOLAX) 17 g packet, Take 1 packet by mouth daily. , Disp: , Rfl:    predniSONE  (DELTASONE) 20 MG tablet, 3 tabs by mouth daily x 3 days, then 2 tabs by mouth daily x 2 days then 1 tab by mouth daily x 2 days, Disp: 15 tablet, Rfl: 0   pregabalin (LYRICA) 300 MG capsule, Take 300 mg by mouth 2 (two) times daily. 150 mg in am and 300 mg at bedtime, Disp: , Rfl:    rOPINIRole (REQUIP) 1 MG tablet, Take 1 mg by mouth in the morning and at bedtime. 1 in am and 1 at 3pm, Disp: , Rfl:    rOPINIRole (REQUIP) 4 MG  tablet, TAKE 1 TABLET AT BEDTIME, Disp: 90 tablet, Rfl: 3   simvastatin (ZOCOR) 20 MG tablet, TAKE 1 TABLET AT BEDTIME, Disp: 90 tablet, Rfl: 3   tiZANidine (ZANAFLEX) 4 MG tablet, Take 4 mg by mouth 3 (three) times daily., Disp: , Rfl:    Acetaminophen (TYLENOL PO), Take 650 mg by mouth 5 (five) times daily., Disp: , Rfl:    Observations/Objective: Blood pressure (!) 152/84, pulse 71, temperature 97.7 F (36.5 C), height 4\' 11"  (1.499 m), weight 172 lb (78 kg).  Physical Exam Constitutional:      General: The patient is not in acute distress. Pulmonary:     Effort: Pulmonary effort is normal. No respiratory distress.  Neurological:     Mental Status: The patient is alert and oriented to person, place, and time.  Psychiatric:        Mood and Affect: Mood normal.        Behavior: Behavior normal.       Assessment and Plan Eye swelling, bilateral Assessment & Plan:  Acute, possibly secondary to allergic reaction to contrast versus medication given at the emergency room versus fluid overload from IV fluids administered at emergency room.  No red flags or suggestion of airway compromise.  I did discuss with patient in detail that if she has any oral swelling or difficulty breathing to be seen at [facility] urgently.  We will try to treat for possible allergic reaction with prednisone taper and oral antihistamine like Zyrtec or Allegra.   Other orders -     predniSONE; 3 tabs by mouth daily x 3 days, then 2 tabs by mouth daily x 2 days then 1 tab by mouth  daily x 2 days  Dispense: 15 tablet; Refill: 0      I discussed the assessment and treatment plan with the patient. The patient was provided an opportunity to ask questions and all were answered. The patient agreed with the plan and demonstrated an understanding of the instructions.   The patient was advised to call back or seek an in-person evaluation if the symptoms worsen or if the condition fails to improve as anticipated.     Kerby Nora, MD

## 2022-07-28 NOTE — Telephone Encounter (Signed)
Noted  

## 2022-07-28 NOTE — Discharge Instructions (Signed)
Take the prednisone as directed by your doctor.  Take Allegra or Zyrtec also.    Follow up with your primary care provider if your symptoms are not improving.

## 2022-07-28 NOTE — Assessment & Plan Note (Addendum)
Acute, possibly secondary to allergic reaction to contrast versus medication given at the emergency room versus fluid overload from IV fluids administered at emergency room.  No red flags or suggestion of airway compromise.  I did discuss with patient in detail that if she has any oral swelling or difficulty breathing to be seen at [facility] urgently.  We will try to treat for possible allergic reaction with prednisone taper and oral antihistamine like Zyrtec or Allegra.

## 2022-07-28 NOTE — Telephone Encounter (Signed)
I spoke with pt; on 07/26/22 had CT with contrast and pt was wondering if contrast media would cause the swelling under eyes. The swelling started on 07/27/22.pt said under both eyes there are small pockets of fluid. No redness,no pain or itching and no burning sensation. Pt does not have any difficulty breathing,no cough and no swelling in mouth,tongue neck or throat. Pt denies being in any distress. Pt already has my chart vv with Dr Ermalene Searing 07/28/22 at 2 pm with UC & ED precautions given and pt voiced understanding,.sending note to Dr Ermalene Searing and [facility] pool (pt will have vital signs ready when CMA calls.)

## 2022-07-28 NOTE — ED Triage Notes (Signed)
Patient to Urgent Care with complaints of a rash present to her face. Reports waking up with symptoms yesterday. Describes fluid filled "bubbles" around her eyes and feels as though they are moving down her face. Denies any other symptoms.   Reports being seen in the ER on Wednesday. Reports she received fentanyl for the first time/ completing a CT with contrast. Concerned about possible allergy.

## 2022-07-28 NOTE — Telephone Encounter (Signed)
Can we Triage her? Checking for airway/breathing, other symptoms of anaphylaxis.   Depending on her answers, we may be able to have her seen virtually today.

## 2022-07-31 NOTE — Telephone Encounter (Signed)
Called and scheduled patient first available appt on 08/02/22. Patient was understanding.

## 2022-07-31 NOTE — Telephone Encounter (Signed)
There are no available appointments today at [facility].  Please advise.

## 2022-07-31 NOTE — Telephone Encounter (Signed)
Please put her in with first available this week for a check  Let her know I have been out of town-that is why I am full I do want her to check in with Dr Dola Factor what she had or would have caused this  Thanks for letting me know

## 2022-08-01 ENCOUNTER — Ambulatory Visit (INDEPENDENT_AMBULATORY_CARE_PROVIDER_SITE_OTHER): Payer: Medicare Other | Admitting: Neurosurgery

## 2022-08-01 ENCOUNTER — Encounter: Payer: Self-pay | Admitting: Neurosurgery

## 2022-08-01 ENCOUNTER — Encounter
Admission: RE | Admit: 2022-08-01 | Discharge: 2022-08-01 | Disposition: A | Discharge status: Home / Self Care | Payer: Medicare Other | Source: Ambulatory Visit | Attending: Gastroenterology | Admitting: Gastroenterology

## 2022-08-01 VITALS — BP 140/78 | Temp 97.8°F | Ht 59.0 in | Wt 168.6 lb

## 2022-08-01 DIAGNOSIS — M4325 Fusion of spine, thoracolumbar region: Secondary | ICD-10-CM | POA: Diagnosis not present

## 2022-08-01 DIAGNOSIS — R110 Nausea: Secondary | ICD-10-CM | POA: Diagnosis not present

## 2022-08-01 DIAGNOSIS — K219 Gastro-esophageal reflux disease without esophagitis: Secondary | ICD-10-CM | POA: Insufficient documentation

## 2022-08-01 DIAGNOSIS — K269 Duodenal ulcer, unspecified as acute or chronic, without hemorrhage or perforation: Secondary | ICD-10-CM | POA: Diagnosis not present

## 2022-08-01 DIAGNOSIS — R1011 Right upper quadrant pain: Secondary | ICD-10-CM | POA: Diagnosis not present

## 2022-08-01 DIAGNOSIS — R140 Abdominal distension (gaseous): Secondary | ICD-10-CM | POA: Insufficient documentation

## 2022-08-01 MED ORDER — TECHNETIUM TC 99M MEBROFENIN IV KIT
5.0000 | PACK | Freq: Once | INTRAVENOUS | Status: AC | PRN
  Administered 2022-08-01: 5.08 via INTRAVENOUS

## 2022-08-01 NOTE — Progress Notes (Signed)
REFERRING PHYSICIAN:  Judy Pimple, Md [address] [place],  [place] [address]  DOS:  T10- pelvis fusion. T12-L1 posterior column osteotomy 04/17/22   HISTORY OF PRESENT ILLNESS:   Erin Petersen is an 82 y.o presenting today for further evaluation of right-sided upper back pain with radiation into her right upper quadrant associated and nausea without vomiting she was seen in the ER regarding this on 07/26/2022.  At that time he did going on for about 4 days and radiated to her back.  Workup was largely normal and the ER provider recommended she follow-up with her PCP or GI should her symptoms continue.  She contacted our office for further evaluation and concerns that this is coming from her back. Today she reports ongoing nausea and right-sided abdominal pain since March without any particular cause.  She states that happens at night particularly with laying on her right side and improved some with changes in position.  Does not seem to correlate with meals.  While she does have some tenderness in her back her pain does not radiate from her back into her abdomen and in her opinion does not feel like nerve pain.  She states that this feels similar to when she had an ulcer previously.  She has follow-up with her primary doctor tomorrow.   LOV 07/06/22 Erin Petersen is approximately 3 months status post thoracolumbar fusion. she is doing well.  She continues to have some midthoracic back discomfort particularly when sitting back against a chair as well as some fatigue in her back muscles towards the end of the day.  She has been doing aqua therapy however she has some concerns about the facilities she is at would like to consider switching to [facility].  She would also like to continue with Celebrex.  LOV 06/06/22  Erin Petersen is status post thoracopelvic fusion.    She is doing very well.  She is taking Celebrex and muscle relaxants for pain control.  She still has stiffness and  some back discomfort but is much better than she was prior to surgery.  PHYSICAL EXAMINATION:  General: Patient is well developed, well nourished, calm, collected, and in no apparent distress.   NEUROLOGICAL:  General: In no acute distress.   Awake, alert, oriented to person, place, and time.  Pupils equal round and reactive to light.  Facial tone is symmetric.  Tongue protrusion is midline.  There is no pronator drift.   Strength:            Side Iliopsoas Quads Hamstring PF DF EHL  R 5 5 5 5 5 5   L 5 5 5 5 5 5    Incision well healed   ROS (Neurologic):  Negative except as noted above  IMAGING: 07/06/22 xrays Without evidence of complications  ASSESSMENT/PLAN:  Erin Petersen is doing well approximately 4 months after spinal fusion.  While she is having some degree of thoracic back pain which sounds like muscular nature, her symptoms do not seem to be consistent with thoracic radiculopathy.  Her CT scan done in the ER shows good hardware placement without any significant changes.  The HIDA scan she had completed today is pending.  I encouraged her to follow-up with GI.  We did discuss the possibility of obtaining a thoracic MRI versus attempting thoracic trigger point injections however she would like to hold off on this at this time.  We will keep her regularly scheduled follow-up.  She will  let Erin Petersen know should she fail to improve.  I spent a total of 32 minutes in both face-to-face and non-face-to-face activities for this visit on the date of this encounter.   Manning Charity PA-C Department of neurosurgery

## 2022-08-02 ENCOUNTER — Ambulatory Visit (INDEPENDENT_AMBULATORY_CARE_PROVIDER_SITE_OTHER): Payer: Medicare Other | Admitting: Family Medicine

## 2022-08-02 ENCOUNTER — Encounter: Payer: Self-pay | Admitting: Family Medicine

## 2022-08-02 VITALS — BP 160/78 | HR 69 | Temp 97.6°F | Ht 59.0 in | Wt 167.2 lb

## 2022-08-02 DIAGNOSIS — R519 Headache, unspecified: Secondary | ICD-10-CM | POA: Diagnosis not present

## 2022-08-02 DIAGNOSIS — S0082XS Blister (nonthermal) of other part of head, sequela: Secondary | ICD-10-CM

## 2022-08-02 DIAGNOSIS — R110 Nausea: Secondary | ICD-10-CM | POA: Diagnosis not present

## 2022-08-02 DIAGNOSIS — I10 Essential (primary) hypertension: Secondary | ICD-10-CM

## 2022-08-02 DIAGNOSIS — R1013 Epigastric pain: Secondary | ICD-10-CM | POA: Insufficient documentation

## 2022-08-02 DIAGNOSIS — K080 Exfoliation of teeth due to systemic causes: Secondary | ICD-10-CM | POA: Diagnosis not present

## 2022-08-02 DIAGNOSIS — G8929 Other chronic pain: Secondary | ICD-10-CM

## 2022-08-02 DIAGNOSIS — S0082XA Blister (nonthermal) of other part of head, initial encounter: Secondary | ICD-10-CM | POA: Insufficient documentation

## 2022-08-02 MED ORDER — SUCRALFATE 1 G PO TABS: 1.0000 g | ORAL_TABLET | Freq: Three times a day (TID) | ORAL | 0 refills | Status: DC

## 2022-08-02 NOTE — Progress Notes (Unsigned)
   Subjective:    Patient ID: Erin Petersen, female    DOB: 1941-01-30, 82 y.o.   MRN: [id number]  HPI Pt presents for ? Blister under eye  Also headache and nausea (at least 2 months)   Wt Readings from Last 3 Encounters:  08/02/22 167 lb 4 oz (75.9 kg)  08/01/22 168 lb 9.6 oz (76.5 kg)  07/28/22 172 lb (78 kg)   33.78 kg/m  Thursday bubbles on face/around eyes  They are improved   Seen on 5/31 at [facility] in [place]  Seen for peri orbital edema (eyelid swelling) Did not look infected ? If allergic Was px prednisone Discussed antihistamine   Stomach ache/ nausea -increased back in march  Thought it was her ulcer- had egd and that was ok (Dr Allegra Lai)  US GB last week-fine  HIDA was neg yesterday   No narcotic pain med (was on oxy for 3 weeks)  Did have some tramadol from 2021  Was taking celebrex- she stopped it and did not help (does not want to take off list)    Was on ppi  Stopped omeprazole 40 mg bid- was told to stop it  When she did / she got worse   Upper abd pain  Knawing sensation like it is empty   Is taking zofran for nausea  Helps just a little bit now   Some headaches Migraines in the past  Top of her head  Also around eye sockets and forehead  A lot of pnd  ? If adding to nausea   No green or yellow mucous right now   Has IBS with constipation  More loose bm lately     Review of Systems     Objective:   Physical Exam        Assessment & Plan:

## 2022-08-02 NOTE — Patient Instructions (Addendum)
Go back to omeprazole - take it daily instead of twice daily   Try carafate three times daily to see if it helps   I will review your chart and make a plan   If the facial blisters return let us know

## 2022-08-03 ENCOUNTER — Telehealth: Payer: Self-pay | Admitting: *Deleted

## 2022-08-03 DIAGNOSIS — I10 Essential (primary) hypertension: Secondary | ICD-10-CM

## 2022-08-03 DIAGNOSIS — R1013 Epigastric pain: Secondary | ICD-10-CM

## 2022-08-03 DIAGNOSIS — R519 Headache, unspecified: Secondary | ICD-10-CM

## 2022-08-03 DIAGNOSIS — R7303 Prediabetes: Secondary | ICD-10-CM

## 2022-08-03 DIAGNOSIS — R110 Nausea: Secondary | ICD-10-CM

## 2022-08-03 NOTE — Assessment & Plan Note (Signed)
Pt has eyelid swelling with some blister formation on face and was seen in [facility] on 5/31 Notes reviewed  Did improve on prednisone Suspect allergic rxn to fentanyl or IV contrast Encouraged to update if this does not continue to improve

## 2022-08-03 NOTE — Assessment & Plan Note (Signed)
Reviewed GI notes , colonoscopy, EGD, abd [place] and HIDA result today  Symptoms sound like gastritis but her EGD was reassuring Also CT of abd/pelvis from December  Does have hepatic steatosis  Instructed to return to ppi once daily  Added carafate today to see if helpful Has zofran for nausea-not very effective Recommend bland diet  May need f/u with GI

## 2022-08-03 NOTE — Telephone Encounter (Signed)
Thanks for letting me know , so sorry to hear that  I want to order MRI of head/brain- tell me if she is agreeable to that  She needs an appt to see GI in person again - (I was really hoping the carafate would help)  So sorry the blisters got worse - if this continues I (or first available) will need to see her in person (they were about resolved when I saw her)  I would also like to get some labs if possible -could she come in tomorrow for lab?

## 2022-08-03 NOTE — Telephone Encounter (Signed)
Pt notified of Dr. Royden Purl comments.  Pt had a few comments she wanted PCP to know.  [name] GI sxs are worse today. Pt has taken the GI meds as prescribed yesterday and today and her abd pain and nausea is worse today then it was yesterday. Pt said she has been on her muscle relaxer since Feb and it doesn't help her HA at all. And her HA is still there and she hasn't had any improvement in her HA sxs. Pt said the blisters PCP eval yesterday are worse today. Pt said when she woke up they are larger today and look more "full"

## 2022-08-03 NOTE — Assessment & Plan Note (Signed)
bp is up due to pain most likely today  BP Readings from Last 1 Encounters:  08/02/22 (!) 160/78   No changes needed Most recent labs reviewed  Disc lifstyle change with low sodium diet and exercise  Amlodipine 5 mg daily  hctz 25 mg daily

## 2022-08-03 NOTE — Assessment & Plan Note (Signed)
Related to her epigastric /RUQ pain  See a/p for that  Reviewed GI notes and studies and labs in detail Has zofran  Will try return to qd ppi and carafate

## 2022-08-03 NOTE — Telephone Encounter (Signed)
-----   Message from Judy Pimple, MD sent at 08/03/2022  2:29 PM EDT ----- Please let pt know I reviewed her records for GI problems and headache  I want to know if symptoms improve over the next week with the addition of carafate and daily reflux medicine (let me know) Also would like to consider MRI of head for the headache since it has worsened so much Also wonder if she is getting some rebound from the tylenol, has she tried any of her muscle relaxer medicines for the headache?      (Thanks)  Let me know and we will plan f/u

## 2022-08-03 NOTE — Assessment & Plan Note (Signed)
This is acute on chronic  ? If cervicogenic Taking tylenol- rebound is possible  On prednisone for another problem-this did not help  Bp is stable  Reviewed last neuro note-in march was offered MRI head but pt declined Will see if agreeable now  Has muscle relaxer and lyrica on med list

## 2022-08-04 ENCOUNTER — Other Ambulatory Visit (INDEPENDENT_AMBULATORY_CARE_PROVIDER_SITE_OTHER): Payer: Medicare Other

## 2022-08-04 ENCOUNTER — Encounter: Payer: Self-pay | Admitting: Family Medicine

## 2022-08-04 DIAGNOSIS — R519 Headache, unspecified: Secondary | ICD-10-CM | POA: Diagnosis not present

## 2022-08-04 DIAGNOSIS — R7303 Prediabetes: Secondary | ICD-10-CM | POA: Diagnosis not present

## 2022-08-04 DIAGNOSIS — R1013 Epigastric pain: Secondary | ICD-10-CM

## 2022-08-04 DIAGNOSIS — R110 Nausea: Secondary | ICD-10-CM | POA: Diagnosis not present

## 2022-08-04 LAB — CBC WITH DIFFERENTIAL/PLATELET
Eosinophils Absolute: 155 cells/uL (ref 15–500)
WBC: 11.1 10*3/uL — ABNORMAL HIGH (ref 3.8–10.8)

## 2022-08-04 MED ORDER — TRAMADOL HCL 50 MG PO TABS: 50.0000 mg | ORAL_TABLET | Freq: Two times a day (BID) | ORAL | 0 refills | Status: DC | PRN

## 2022-08-04 MED ORDER — ONDANSETRON 4 MG PO TBDP: 4.0000 mg | ORAL_TABLET | Freq: Three times a day (TID) | ORAL | 0 refills | Status: DC | PRN

## 2022-08-04 NOTE — Addendum Note (Signed)
Addended by: Eual Fines on: 08/04/2022 02:22 PM   Modules accepted: Orders

## 2022-08-04 NOTE — Addendum Note (Signed)
Addended by: Roxy Manns A on: 08/04/2022 10:41 AM   Modules accepted: Orders

## 2022-08-04 NOTE — Telephone Encounter (Signed)
Called patient reviewed all information and repeated back to me. Will call if any questions.  She will be in this afternoon for labs. Understands ED precaution. If she has not received appointment for MRI soon she will give [place] a call.

## 2022-08-04 NOTE — Telephone Encounter (Signed)
Added to open phone note and sent to provider to review.

## 2022-08-04 NOTE — Telephone Encounter (Signed)
Called patient she is ok with MRI would like in [place]  She sent below message about GI symptoms today and wanted you to review update.  I have set up for lab app in our office this afternoon.      Good Morning,   I started to improve around 9:00pm last night.   I woke up this morning with nausea and a headache in my eyes and eye sockets.  I took the Sucralfate and then Zofran a half hour later.  The nausea and headache are still there but tolerable as long as I stay medicated.    The Zofran that melts under my tongue works better than the tablets so I would appreciate a 90 day supply sent to [facility].  I have been taking 2 extra strength Tylenol a day for the headache without improvement.  I have 4 Tramadol (2021) tablets on hand.  May I take them for the headache instead of the Tylenol?  If so, Please send a prescription to [facility].  Eyes are the same.  Britta Mccreedy

## 2022-08-04 NOTE — Telephone Encounter (Signed)
I put the referral in for MRI as urgent since pt is symptomatic  If headache suddenly becomes severe do go to ER  I sent zofran I sent some Tramadol - use caution of sedation  - short term   Thanks for the heads up   Labs ordered

## 2022-08-05 ENCOUNTER — Ambulatory Visit
Admission: RE | Admit: 2022-08-05 | Discharge: 2022-08-05 | Disposition: A | Discharge status: Home / Self Care | Payer: Medicare Other | Source: Ambulatory Visit | Attending: Family Medicine | Admitting: Family Medicine

## 2022-08-05 DIAGNOSIS — H538 Other visual disturbances: Secondary | ICD-10-CM | POA: Diagnosis not present

## 2022-08-05 DIAGNOSIS — R519 Headache, unspecified: Secondary | ICD-10-CM

## 2022-08-05 LAB — CBC WITH DIFFERENTIAL/PLATELET
Absolute Monocytes: 977 cells/uL — ABNORMAL HIGH (ref 200–950)
Basophils Absolute: 67 cells/uL (ref 0–200)
Basophils Relative: 0.6 %
Eosinophils Relative: 1.4 %
HCT: 39.6 % (ref 35.0–45.0)
Hemoglobin: 12.8 g/dL (ref 11.7–15.5)
Lymphs Abs: 2398 cells/uL (ref 850–3900)
MCH: 24.7 pg — ABNORMAL LOW (ref 27.0–33.0)
MCHC: 32.3 g/dL (ref 32.0–36.0)
MCV: 76.4 fL — ABNORMAL LOW (ref 80.0–100.0)
MPV: 11.3 fL (ref 7.5–12.5)
Monocytes Relative: 8.8 %
Neutro Abs: 7504 cells/uL (ref 1500–7800)
Neutrophils Relative %: 67.6 %
Platelets: 375 10*3/uL (ref 140–400)
RBC: 5.18 10*6/uL — ABNORMAL HIGH (ref 3.80–5.10)
RDW: 22.3 % — ABNORMAL HIGH (ref 11.0–15.0)
Total Lymphocyte: 21.6 %

## 2022-08-05 LAB — HEPATIC FUNCTION PANEL
AG Ratio: 2 (calc) (ref 1.0–2.5)
ALT: 24 U/L (ref 6–29)
AST: 15 U/L (ref 10–35)
Albumin: 4.5 g/dL (ref 3.6–5.1)
Alkaline phosphatase (APISO): 93 U/L (ref 37–153)
Bilirubin, Direct: 0.1 mg/dL (ref 0.0–0.2)
Globulin: 2.3 g/dL (calc) (ref 1.9–3.7)
Indirect Bilirubin: 0.3 mg/dL (calc) (ref 0.2–1.2)
Total Bilirubin: 0.4 mg/dL (ref 0.2–1.2)
Total Protein: 6.8 g/dL (ref 6.1–8.1)

## 2022-08-05 LAB — BASIC METABOLIC PANEL
BUN: 20 mg/dL (ref 7–25)
CO2: 31 mmol/L (ref 20–32)
Calcium: 10 mg/dL (ref 8.6–10.4)
Chloride: 96 mmol/L — ABNORMAL LOW (ref 98–110)
Creat: 0.88 mg/dL (ref 0.60–0.95)
Glucose, Bld: 133 mg/dL — ABNORMAL HIGH (ref 65–99)
Potassium: 3.5 mmol/L (ref 3.5–5.3)
Sodium: 137 mmol/L (ref 135–146)

## 2022-08-05 LAB — HEMOGLOBIN A1C
Hgb A1c MFr Bld: 5.9 % of total Hgb — ABNORMAL HIGH (ref <5.7)
Mean Plasma Glucose: 123 mg/dL
eAG (mmol/L): 6.8 mmol/L

## 2022-08-05 LAB — SEDIMENTATION RATE: Sed Rate: 6 mm/h (ref 0–30)

## 2022-08-05 LAB — LIPASE: Lipase: 22 U/L (ref 7–60)

## 2022-08-05 LAB — C-REACTIVE PROTEIN: CRP: 3.3 mg/L (ref <8.0)

## 2022-08-07 ENCOUNTER — Ambulatory Visit: Payer: Medicare Other | Admitting: Physician Assistant

## 2022-08-07 VITALS — BP 140/82 | HR 78 | Temp 97.8°F | Resp 16 | Ht 59.0 in | Wt 169.0 lb

## 2022-08-07 DIAGNOSIS — J45990 Exercise induced bronchospasm: Secondary | ICD-10-CM | POA: Diagnosis not present

## 2022-08-07 DIAGNOSIS — Z7189 Other specified counseling: Secondary | ICD-10-CM

## 2022-08-07 DIAGNOSIS — G4733 Obstructive sleep apnea (adult) (pediatric): Secondary | ICD-10-CM | POA: Diagnosis not present

## 2022-08-07 NOTE — Progress Notes (Signed)
[facility] [address] [place], [place] [address]  Pulmonary Sleep Medicine   Office Visit Note  Patient Name: Erin Petersen DOB: 11-16-1940 MRN [id number]  Date of Service: 08/22/2022  Complaints/HPI: pt is here for routine pulmonary follow up. She was hoping to learn more about [facility]. Does already see an ENT, but unsure if they do the procedure. She has used cpap for years, but has been having a lof of headaches and pressures now and is becoming intolerable. Went a few nights without cpap and headache improved. Did have some imaging done and some possible sinus cysts seen. Last test only showed mild OSA and may pursue oral appliance as Earnest Bailey is for mod/severe OSA. Some wheezing and SOB only with exertion. Will use albuterol when needed. Thinks she tried advair and symbicort previously. Thinks she may have had an issue with advair before making her feel worse and has been doing ok without a daily inhaler. Has had several surgeries and is currently in a back brace.   ROS  General: (-) fever, (-) chills, (-) night sweats, (-) weakness Skin: (-) rashes, (-) itching,. Eyes: (-) visual changes, (-) redness, (-) itching. Nose and Sinuses: (-) nasal stuffiness or itchiness, (-) postnasal drip, (-) nosebleeds, (-) sinus trouble. Mouth and Throat: (-) sore throat, (-) hoarseness. Neck: (-) swollen glands, (-) enlarged thyroid, (-) neck pain. Respiratory: - cough, (-) bloody sputum, - shortness of breath, - wheezing. Cardiovascular: - ankle swelling, (-) chest pain. Lymphatic: (-) lymph node enlargement. Neurologic: (-) numbness, (-) tingling. Psychiatric: (-) anxiety, (-) depression   Current Medication: Outpatient Encounter Medications as of 08/07/2022  Medication Sig   albuterol (VENTOLIN HFA) 108 (90 Base) MCG/ACT inhaler Inhale 2 puffs into the lungs every 6 (six) hours as needed for wheezing or shortness of breath.   amitriptyline (ELAVIL) 25 MG tablet TAKE 1  TABLET AT BEDTIME   amLODipine (NORVASC) 5 MG tablet Take 1 tablet (5 mg total) by mouth daily.   aspirin 81 MG EC tablet Take 81 mg by mouth at bedtime.    bisacodyl (DULCOLAX) 5 MG EC tablet Take 1-2 tablets (5-10 mg total) by mouth daily as needed for moderate constipation.   calcium carbonate (TUMS EX) 750 MG chewable tablet Chew 3 tablets by mouth daily.   CALCIUM-VITAMIN D-VITAMIN K PO Take 750 mg by mouth daily.   celecoxib (CELEBREX) 200 MG capsule Take 1 capsule (200 mg total) by mouth 2 (two) times daily.   Cholecalciferol (VITAMIN D3) 50 MCG (2000 UT) TABS Take 2,000 Units by mouth daily.   Cyanocobalamin (VITAMIN B-12) 1000 MCG SUBL Take 2,000 mcg by mouth daily.   EPINEPHrine 0.3 mg/0.3 mL IJ SOAJ injection Inject 0.3 mg into the muscle as needed for anaphylaxis.   fluticasone (FLONASE) 50 MCG/ACT nasal spray Place 2 sprays into both nostrils daily. qhs   hydrochlorothiazide (HYDRODIURIL) 25 MG tablet TAKE 1 TABLET DAILY   Melatonin 10 MG CAPS Take 1 capsule by mouth as needed.   methocarbamol (ROBAXIN) 500 MG tablet Take 1 tablet (500 mg total) by mouth every 6 (six) hours as needed for muscle spasms.   omeprazole (PRILOSEC) 40 MG capsule Take 1 capsule (40 mg total) by mouth 2 (two) times daily before a meal. (Patient taking differently: Take 40 mg by mouth daily.)   ondansetron (ZOFRAN-ODT) 4 MG disintegrating tablet Take 1 tablet (4 mg total) by mouth every 8 (eight) hours as needed for nausea or vomiting.   polyethylene glycol (MIRALAX /  GLYCOLAX) 17 g packet Take 1 packet by mouth daily.    pregabalin (LYRICA) 300 MG capsule Take 300 mg by mouth 2 (two) times daily. 150 mg in am and 300 mg at bedtime   rOPINIRole (REQUIP) 1 MG tablet Take 1 mg by mouth in the morning and at bedtime. 1 in am and 1 at 3pm   rOPINIRole (REQUIP) 4 MG tablet TAKE 1 TABLET AT BEDTIME   simvastatin (ZOCOR) 20 MG tablet TAKE 1 TABLET AT BEDTIME   sucralfate (CARAFATE) 1 g tablet Take 1 tablet (1 g  total) by mouth 4 (four) times daily -  with meals and at bedtime.   tiZANidine (ZANAFLEX) 4 MG tablet Take 4 mg by mouth 3 (three) times daily.   traMADol (ULTRAM) 50 MG tablet Take 1 tablet (50 mg total) by mouth every 12 (twelve) hours as needed for severe pain.   No facility-administered encounter medications on file as of 08/07/2022.    Surgical History: Past Surgical History:  Procedure Laterality Date   ABDOMINAL HYSTERECTOMY  1972   APPENDECTOMY  1958   APPLICATION OF INTRAOPERATIVE CT SCAN N/A 04/17/2022   Procedure: APPLICATION OF INTRAOPERATIVE CT SCAN;  Surgeon: Venetia Night, MD;  Location: [facility] ORS;  Service: Neurosurgery;  Laterality: N/A;   BILATERAL SALPINGOOPHORECTOMY  01/07/2018   BIOPSY  04/15/2019   Procedure: BIOPSY;  Surgeon: Toney Reil, MD;  Location: [facility];  Service: Endoscopy;;   CARDIAC CATHETERIZATION Left 07/21/2016   COLONOSCOPY WITH PROPOFOL N/A 11/11/2018   Procedure: COLONOSCOPY WITH PROPOFOL;  Surgeon: Toney Reil, MD;  Location: [facility] ENDOSCOPY;  Service: Gastroenterology;  Laterality: N/A;   CYSTO WITH HYDRODISTENSION N/A 12/30/2018   Procedure: CYSTOSCOPY/HYDRODISTENSION WITH MARCAINE;  Surgeon: Alfredo Martinez, MD;  Location: [facility] ORS;  Service: Urology;  Laterality: N/A;   ESOPHAGOGASTRODUODENOSCOPY (EGD) WITH PROPOFOL N/A 04/15/2019   Procedure: ESOPHAGOGASTRODUODENOSCOPY (EGD) WITH PROPOFOL;  Surgeon: Toney Reil, MD;  Location: [facility];  Service: Endoscopy;  Laterality: N/A;  sleep apnea   ESOPHAGOGASTRODUODENOSCOPY (EGD) WITH PROPOFOL N/A 07/09/2019   Procedure: ESOPHAGOGASTRODUODENOSCOPY (EGD) WITH PROPOFOL;  Surgeon: Toney Reil, MD;  Location: [name] ENDOSCOPY;  Service: Gastroenterology;  Laterality: N/A;   ESOPHAGOGASTRODUODENOSCOPY (EGD) WITH PROPOFOL N/A 06/15/2022   Procedure: ESOPHAGOGASTRODUODENOSCOPY (EGD) WITH PROPOFOL;  Surgeon: Toney Reil, MD;  Location: [name]  SURGERY [facility];  Service: Endoscopy;  Laterality: N/A;  sleep apnea   EYE SURGERY Bilateral    INGUINAL HERNIA REPAIR Bilateral 01/05/2017   JOINT REPLACEMENT     LAPAROSCOPIC BILATERAL SALPINGO OOPHERECTOMY  2019   LUMBAR FUSION  2002   LUMBAR FUSION  11/14/2021   Dr Sharolyn Douglas   LUMBAR FUSION  12/14/2021   Dr Malachy Chamber   SPINAL FUSION  2002   TONSILLECTOMY AND ADENOIDECTOMY  1961   TOTAL KNEE ARTHROPLASTY Right 01/01/2020   Procedure: Right total knee arthroplasty - [place] Neighbor to Assist;  Surgeon: Kennedy Bucker, MD;  Location: [facility] ORS;  Service: Orthopedics;  Laterality: Right;   TOTAL KNEE ARTHROPLASTY Left 10/12/2020   Procedure: TOTAL KNEE ARTHROPLASTY;  Surgeon: Kennedy Bucker, MD;  Location: [facility] ORS;  Service: Orthopedics;  Laterality: Left;   TUBAL LIGATION     UMBILICAL HERNIA REPAIR N/A 01/05/2017    Medical History: Past Medical History:  Diagnosis Date   Anemia    Arthritis    Asthma    Bacteremia due to Gram-negative bacteria 06/04/2019   CAD (coronary artery disease) 07/21/2016   a.) LHC 07/21/2016: mild nonobstructive  CAD   Colon polyps    Complication of anesthesia    a.) anesthesia awareness   Compression fracture of L1 lumbar vertebra (HCC)    COPD (chronic obstructive pulmonary disease) (HCC)    Diastolic dysfunction    a.) TTE 03/02/2021: EF >55%, mild LVH, mild LAE, triv MR, mild TR/PR, G1DD   Duodenal ulcer    Dyspnea    GERD (gastroesophageal reflux disease)    History of 2019 novel coronavirus disease (COVID-19) 01/19/2021   a.) tested (+) at [facility]   History of lumbar fusion    a.) T12-S1   Hyperlipidemia    Hypertension    Insomnia    a.) takes melatonin PRN   OSA on CPAP    Osteopenia    Peripheral neuropathy    Plantar fasciitis    Pneumonia    Pulmonary hypertension (HCC)    a.) TTE 06/04/2019: moderate (PASP 40.3 mmHg)   Right inguinal hernia    a.) s/p repair 12/2016   RLS (restless legs syndrome)    a.) on ropinirole    SOB (shortness of breath)    T12 compression fracture (HCC)    Umbilical hernia    a.) s/p repair 12/2016   Urinary incontinence    Urinary tract infection    Wears hearing aid in both ears     Family History: Family History  Problem Relation Age of Onset   Alcohol abuse Mother    Cancer Mother    Stroke Mother    Alcohol abuse Father    Arthritis Sister    Arthritis Daughter    COPD Daughter    Arthritis Paternal Grandmother    Asthma Paternal Grandmother    COPD Paternal Grandmother    Breast cancer Paternal Aunt     Social History: Social History   Socioeconomic History   Marital status: Widowed    Spouse name: Not on file   Number of children: 3   Years of education: Not on file   Highest education level: Associate degree: academic program  Occupational History   Not on file  Tobacco Use   Smoking status: Never   Smokeless tobacco: Never  Vaping Use   Vaping Use: Never used  Substance and Sexual Activity   Alcohol use: Not Currently   Drug use: Never   Sexual activity: Not Currently  Other Topics Concern   Not on file  Social History Narrative   Moved from [place] 2020, lives near daughter. Retired. Enjoys walking. Lives alone   Social Determinants of Health   Financial Resource Strain: Medium Risk (08/01/2022)   Overall Financial Resource Strain (CARDIA)    Difficulty of Paying Living Expenses: Somewhat hard  Food Insecurity: No Food Insecurity (08/01/2022)   Hunger Vital Sign    Worried About Running Out of Food in the Last Year: Never true    Ran Out of Food in the Last Year: Never true  Transportation Needs: No Transportation Needs (08/01/2022)   PRAPARE - Administrator, Civil Service (Medical): No    Lack of Transportation (Non-Medical): No  Physical Activity: Inactive (08/01/2022)   Exercise Vital Sign    Days of Exercise per Week: 0 days    Minutes of Exercise per Session: 0 min  Stress: No Stress Concern Present (08/01/2022)   [employer] of Occupational Health - Occupational Stress Questionnaire    Feeling of Stress : Not at all  Social Connections: Moderately Integrated (08/01/2022)   Social Connection and Isolation Panel [  NHANES]    Frequency of Communication with Friends and Family: More than three times a week    Frequency of Social Gatherings with Friends and Family: More than three times a week    Attends Religious Services: More than 4 times per year    Active Member of [employer] or Organizations: Yes    Attends Banker Meetings: More than 4 times per year    Marital Status: Widowed  Intimate Partner Violence: Not At Risk (06/16/2022)   Humiliation, Afraid, Rape, and Kick questionnaire    Fear of Current or Ex-Partner: No    Emotionally Abused: No    Physically Abused: No    Sexually Abused: No    Vital Signs: Blood pressure (!) 140/82, pulse 78, temperature 97.8 F (36.6 C), resp. rate 16, height 4\' 11"  (1.499 m), weight 169 lb (76.7 kg), SpO2 93 %.  Examination: General Appearance: The patient is well-developed, well-nourished, and in no distress. Skin: Gross inspection of skin unremarkable. Head: normocephalic, no gross deformities. Eyes: no gross deformities noted. ENT: ears appear grossly normal no exudates. Neck: Supple. No thyromegaly. No LAD. Respiratory: Lungs clear on auscultation. Cardiovascular: Normal S1 and S2 without murmur or rub. Extremities: No cyanosis. pulses are equal. Neurologic: Alert and oriented. No involuntary movements.  LABS: Recent Results (from the past 2160 hour(s))  CBC     Status: Abnormal   Collection Time: 06/08/22  3:17 PM  Result Value Ref Range   WBC 8.2 3.4 - 10.8 x10E3/uL   RBC 4.19 3.77 - 5.28 x10E6/uL   Hemoglobin 9.6 (L) 11.1 - 15.9 g/dL   Hematocrit 16.1 (L) 09.6 - 46.6 %   MCV 73 (L) 79 - 97 fL   MCH 22.9 (L) 26.6 - 33.0 pg   MCHC 31.3 (L) 31.5 - 35.7 g/dL   RDW 04.5 (H) 40.9 - 81.1 %   Platelets 415 150 - 450 x10E3/uL  Fe+TIBC+Fer      Status: Abnormal   Collection Time: 06/08/22  3:17 PM  Result Value Ref Range   Total Iron Binding Capacity 409 250 - 450 ug/dL   UIBC 914 (H) 782 - 956 ug/dL   Iron 22 (L) 27 - 213 ug/dL   Iron Saturation 5 (LL) 15 - 55 %   Ferritin 16 15 - 150 ng/mL  B12 and Folate Panel     Status: None   Collection Time: 06/08/22  3:17 PM  Result Value Ref Range   Vitamin B-12 506 232 - 1,245 pg/mL   Folate 15.6 >3.0 ng/mL    Comment: A serum folate concentration of less than 3.1 ng/mL is considered to represent clinical deficiency.   Potassium     Status: None   Collection Time: 06/12/22 10:53 AM  Result Value Ref Range   Potassium 3.6 3.5 - 5.1 mmol/L    Comment: Performed at [facility], [address] [place], [place] [address]  Surgical pathology     Status: None   Collection Time: 06/15/22 11:13 AM  Result Value Ref Range   SURGICAL PATHOLOGY      SURGICAL PATHOLOGY CASE: [id number] PATIENT: Jeanie Sewer Surgical Pathology Report     Specimen Submitted: A. Duodenum; cbx B. Stomach, random; cbx  Clinical History: Duodenal ulcer K26.9.    DIAGNOSIS: A. DUODENUM; COLD BIOPSY: - ENTERIC MUCOSA WITH PRESERVED VILLOUS ARCHITECTURE AND NO SIGNIFICANT HISTOPATHOLOGIC CHANGE. - NEGATIVE FOR FEATURES OF CELIAC, DYSPLASIA, AND MALIGNANCY.  B. STOMACH, RANDOM; COLD BIOPSY: - GASTRIC ANTRAL MUCOSA WITH FEATURES  OF REACTIVE GASTROPATHY. - GASTRIC OXYNTIC MUCOSA WITH CHANGES SUGGESTIVE OF PPI EFFECT. - NEGATIVE FOR H. PYLORI, DYSPLASIA, AND MALIGNANCY.  GROSS DESCRIPTION: A. Labeled: Duodenum rule out iron anemia deficiency-cold biopsy Received: Formalin Collection time: 11:13 AM on 06/15/2022 Placed into formalin time: 11:13 AM on 06/15/2022 Tissue fragment(s): Multiple Size: Aggregate, 1.0 x 0.3 x 0.1 cm Description: Tan soft tissue fragments Entirely submitted in 1 cassette.  B. Labeled: Random stomac h gastric erythema-cold biopsy Received:  Formalin Collection time: 11:30 AM on 06/15/2022 Placed into formalin time: 11:30 AM on 06/15/2022 Tissue fragment(s): Multiple Size: Aggregate, 0.5 x 0.5 x 0.1 cm Description: Tan soft tissue fragments Entirely submitted in 1 cassette.  CM 06/16/2022  Final Diagnosis performed by Katherine Mantle, MD.   Electronically signed 06/19/2022 12:00:54PM The electronic signature indicates that the named Attending Pathologist has evaluated the specimen Technical component performed at [facility], [address] [place], [place] [address] Lab: [phone] Dir: Jolene Schimke, MD, MMM  Professional component performed at [facility], [facility], [address] [place], [place], [place] [address] Lab: [phone] Dir: Beryle Quant, MD   Comprehensive metabolic panel     Status: Abnormal   Collection Time: 07/26/22 10:10 AM  Result Value Ref Range   Sodium 137 135 - 145 mmol/L   Potassium 3.8 3.5 - 5.1 mmol/L   Chloride 101 98 - 111 mmol/L   CO2 28 22 - 32 mmol/L   Glucose, Bld 118 (H) 70 - 99 mg/dL    Comment: Glucose reference range applies only to samples taken after fasting for at least 8 hours.   BUN 19 8 - 23 mg/dL   Creatinine, Ser 5.78 0.44 - 1.00 mg/dL   Calcium 9.6 8.9 - 46.9 mg/dL   Total Protein 7.5 6.5 - 8.1 g/dL   Albumin 4.9 3.5 - 5.0 g/dL   AST 22 15 - 41 U/L   ALT 25 0 - 44 U/L   Alkaline Phosphatase 107 38 - 126 U/L   Total Bilirubin 0.5 0.3 - 1.2 mg/dL   GFR, Estimated >62 >95 mL/min    Comment: (NOTE) Calculated using the CKD-EPI Creatinine Equation (2021)    Anion gap 8 5 - 15    Comment: Performed at [facility], [address] [place], [place] [address]  CBC with Differential     Status: Abnormal   Collection Time: 07/26/22 10:10 AM  Result Value Ref Range   WBC 6.3 4.0 - 10.5 K/uL   RBC 5.27 (H) 3.87 - 5.11 MIL/uL   Hemoglobin 12.8 12.0 - 15.0 g/dL   HCT 24.4 01.0 - 27.2 %   MCV 79.1 (L) 80.0 - 100.0 fL   MCH 24.3 (L) 26.0 - 34.0 pg   MCHC  30.7 30.0 - 36.0 g/dL   RDW 53.6 (H) 64.4 - 03.4 %   Platelets 334 150 - 400 K/uL   nRBC 0.0 0.0 - 0.2 %   Neutrophils Relative % 67 %   Neutro Abs 4.2 1.7 - 7.7 K/uL   Lymphocytes Relative 22 %   Lymphs Abs 1.4 0.7 - 4.0 K/uL   Monocytes Relative 8 %   Monocytes Absolute 0.5 0.1 - 1.0 K/uL   Eosinophils Relative 1 %   Eosinophils Absolute 0.1 0.0 - 0.5 K/uL   Basophils Relative 1 %   Basophils Absolute 0.0 0.0 - 0.1 K/uL   WBC Morphology MORPHOLOGY UNREMARKABLE    Smear Review Normal platelet morphology    Immature Granulocytes 1 %   Abs  Immature Granulocytes 0.03 0.00 - 0.07 K/uL   Ovalocytes PRESENT     Comment: Performed at [facility], [address] [place], [place] [address]  Lipase, blood     Status: None   Collection Time: 07/26/22 10:10 AM  Result Value Ref Range   Lipase 28 11 - 51 U/L    Comment: Performed at [facility], [address] [place], [place] [address]  Urinalysis, Routine w reflex microscopic -Urine, Clean Catch     Status: Abnormal   Collection Time: 07/26/22 10:10 AM  Result Value Ref Range   Color, Urine YELLOW (A) YELLOW   APPearance CLEAR (A) CLEAR   Specific Gravity, Urine 1.009 1.005 - 1.030   pH 7.0 5.0 - 8.0   Glucose, UA NEGATIVE NEGATIVE mg/dL   Hgb urine dipstick NEGATIVE NEGATIVE   Bilirubin Urine NEGATIVE NEGATIVE   Ketones, ur NEGATIVE NEGATIVE mg/dL   Protein, ur NEGATIVE NEGATIVE mg/dL   Nitrite NEGATIVE NEGATIVE   Leukocytes,Ua NEGATIVE NEGATIVE    Comment: Performed at [facility], [address] [place], [place] [address]  Troponin I (High Sensitivity)     Status: None   Collection Time: 07/26/22 10:10 AM  Result Value Ref Range   Troponin I (High Sensitivity) 4 <18 ng/L    Comment: (NOTE) Elevated high sensitivity troponin I (hsTnI) values and significant  changes across serial measurements may suggest ACS but many other  chronic and acute conditions are known to elevate hsTnI results.   Refer to the "Links" section for chest pain algorithms and additional  guidance. Performed at [facility], [address] [place], [place] [address]   Troponin I (High Sensitivity)     Status: None   Collection Time: 07/26/22  2:26 PM  Result Value Ref Range   Troponin I (High Sensitivity) 4 <18 ng/L    Comment: (NOTE) Elevated high sensitivity troponin I (hsTnI) values and significant  changes across serial measurements may suggest ACS but many other  chronic and acute conditions are known to elevate hsTnI results.  Refer to the "Links" section for chest pain algorithms and additional  guidance. Performed at [facility], [address] [place], [place] [address]   CBC with Differential/Platelet     Status: Abnormal   Collection Time: 08/04/22  2:23 PM  Result Value Ref Range   WBC 11.1 (H) 3.8 - 10.8 Thousand/uL   RBC 5.18 (H) 3.80 - 5.10 Million/uL   Hemoglobin 12.8 11.7 - 15.5 g/dL   HCT 52.8 41.3 - 24.4 %   MCV 76.4 (L) 80.0 - 100.0 fL   MCH 24.7 (L) 27.0 - 33.0 pg   MCHC 32.3 32.0 - 36.0 g/dL   RDW 01.0 (H) 27.2 - 53.6 %   Platelets 375 140 - 400 Thousand/uL   MPV 11.3 7.5 - 12.5 fL   Neutro Abs 7,504 1,500 - 7,800 cells/uL   Lymphs Abs 2,398 850 - 3,900 cells/uL   Absolute Monocytes 977 (H) 200 - 950 cells/uL   Eosinophils Absolute 155 15 - 500 cells/uL   Basophils Absolute 67 0 - 200 cells/uL   Neutrophils Relative % 67.6 %   Total Lymphocyte 21.6 %   Monocytes Relative 8.8 %   Eosinophils Relative 1.4 %   Basophils Relative 0.6 %   Smear Review      Comment: . Red cell morphology appears unremarkable . Review of peripheral smear confirms automated results.   Basic metabolic panel     Status:  Abnormal   Collection Time: 08/04/22  2:23 PM  Result Value Ref Range   Glucose, Bld 133 (H) 65 - 99 mg/dL    Comment: .            Fasting reference interval . For someone without known diabetes, a glucose value >125 mg/dL indicates that  they may have diabetes and this should be confirmed with a follow-up test. .    BUN 20 7 - 25 mg/dL   Creat 8.65 7.84 - 6.96 mg/dL   BUN/Creatinine Ratio SEE NOTE: 6 - 22 (calc)    Comment:    Not Reported: BUN and Creatinine are within    reference range. .    Sodium 137 135 - 146 mmol/L   Potassium 3.5 3.5 - 5.3 mmol/L   Chloride 96 (L) 98 - 110 mmol/L   CO2 31 20 - 32 mmol/L   Calcium 10.0 8.6 - 10.4 mg/dL  Hepatic function panel     Status: None   Collection Time: 08/04/22  2:23 PM  Result Value Ref Range   Total Protein 6.8 6.1 - 8.1 g/dL   Albumin 4.5 3.6 - 5.1 g/dL   Globulin 2.3 1.9 - 3.7 g/dL (calc)   AG Ratio 2.0 1.0 - 2.5 (calc)   Total Bilirubin 0.4 0.2 - 1.2 mg/dL   Bilirubin, Direct 0.1 0.0 - 0.2 mg/dL   Indirect Bilirubin 0.3 0.2 - 1.2 mg/dL (calc)   Alkaline phosphatase (APISO) 93 37 - 153 U/L   AST 15 10 - 35 U/L   ALT 24 6 - 29 U/L  Sedimentation Rate     Status: None   Collection Time: 08/04/22  2:23 PM  Result Value Ref Range   Sed Rate 6 0 - 30 mm/h  C-reactive protein     Status: None   Collection Time: 08/04/22  2:23 PM  Result Value Ref Range   CRP 3.3 <8.0 mg/L  Lipase     Status: None   Collection Time: 08/04/22  2:23 PM  Result Value Ref Range   Lipase 22 7 - 60 U/L  Hemoglobin A1c     Status: Abnormal   Collection Time: 08/04/22  2:23 PM  Result Value Ref Range   Hgb A1c MFr Bld 5.9 (H) <5.7 % of total Hgb    Comment: For someone without known diabetes, a hemoglobin  A1c value between 5.7% and 6.4% is consistent with prediabetes and should be confirmed with a  follow-up test. . For someone with known diabetes, a value <7% indicates that their diabetes is well controlled. A1c targets should be individualized based on duration of diabetes, age, comorbid conditions, and other considerations. . This assay result is consistent with an increased risk of diabetes. . Currently, no consensus exists regarding use of hemoglobin A1c for  diagnosis of diabetes for children. .    Mean Plasma Glucose 123 mg/dL   eAG (mmol/L) 6.8 mmol/L    Comment: . This test was performed on the Roche cobas c503 platform. Effective 12/05/21, a change in test platforms from the Abbott Architect to the Roche cobas c503 may have shifted HbA1c results compared to historical results. Based on laboratory validation testing conducted at [facility], the Roche platform relative to the Abbott platform had an average increase in HbA1c value of < or = 0.3%. This difference is within accepted  variability established by the [facility]. Note that not all individuals will have had a shift in their results and direct comparisons  between historical and current results for testing conducted on different platforms is not recommended.     Radiology: MR Brain Wo Contrast  Result Date: 08/05/2022 CLINICAL DATA:  Provided history: Acute non-intractable headache, unspecified headache type. Headache, new onset. Headache, increasing frequency or severity. New persistent headache in 82 year old. Additional history provided by the scanning technologist: blurred vision for 2 months. EXAM: MRI HEAD WITHOUT CONTRAST TECHNIQUE: Multiplanar, multiecho pulse sequences of the brain and surrounding structures were obtained without intravenous contrast. COMPARISON:  Brain MRI 05/04/2020. FINDINGS: Brain: No age advanced or lobar predominant parenchymal atrophy. Redemonstrated small chronic infarct within the left corona radiata. Mild multifocal T2 FLAIR hyperintense signal abnormality elsewhere within the cerebral white matter, nonspecific but compatible with chronic small vessel ischemic disease. Redemonstrated small chronic infarct within the right cerebellar hemisphere. There is no acute infarct. No evidence of an intracranial mass. No chronic intracranial blood products. No extra-axial fluid collection. No midline shift. Vascular: Maintained  flow voids within the proximal large arterial vessels. Skull and upper cervical spine: No focal suspicious marrow lesion. Sinuses/Orbits: No mass or acute finding within the imaged orbits. Small mucous retention cysts within the bilateral maxillary sinuses. IMPRESSION: 1.  No evidence of an acute intracranial abnormality. 2. Redemonstrated small chronic infarcts within the left corona radiata and right cerebellar hemisphere. 3. Background mild cerebral white matter chronic small vessel ischemic disease, similar to the prior brain MRI of 05/04/2020. 4. Small mucous retention cysts within the maxillary sinuses. Electronically Signed   By: Jackey Loge D.O.   On: 08/05/2022 16:50    No results found.  DG Lumbar Spine 2-3 Views  Result Date: 08/18/2022 CLINICAL DATA:  Pain, recent fall EXAM: LUMBAR SPINE - 2-3 VIEW COMPARISON:  07/06/2022 FINDINGS: Spinal rods and fixating screws from T10 to the sacrum. Stable appearance of hardware. Chronic compression deformities at T12 and L1. Anterior fusion hardware at L2-L3 and L3-L4. Vertebra demonstrate normal stature IMPRESSION: Postsurgical changes. Chronic compression deformities at T12 and L1. Electronically Signed   By: Jasmine Pang M.D.   On: 08/18/2022 20:57   DG Thoracic Spine 2 View  Result Date: 08/18/2022 CLINICAL DATA:  Fall recent fusion EXAM: THORACIC SPINE 2 VIEWS COMPARISON:  07/06/2022 thoracic CT 03/28/2022 FINDINGS: Mild levoscoliosis of the upper thoracic spine.posterior spinal rods and fixating screws from T10. Similar compression deformity of T12. Chronic superior endplate compression at L1. Remaining vertebra demonstrate normal stature IMPRESSION: Posterior fusion from T10 with similar compression deformity of T12. Electronically Signed   By: Jasmine Pang M.D.   On: 08/18/2022 20:52   MR Brain Wo Contrast  Result Date: 08/05/2022 CLINICAL DATA:  Provided history: Acute non-intractable headache, unspecified headache type. Headache, new  onset. Headache, increasing frequency or severity. New persistent headache in 82 year old. Additional history provided by the scanning technologist: blurred vision for 2 months. EXAM: MRI HEAD WITHOUT CONTRAST TECHNIQUE: Multiplanar, multiecho pulse sequences of the brain and surrounding structures were obtained without intravenous contrast. COMPARISON:  Brain MRI 05/04/2020. FINDINGS: Brain: No age advanced or lobar predominant parenchymal atrophy. Redemonstrated small chronic infarct within the left corona radiata. Mild multifocal T2 FLAIR hyperintense signal abnormality elsewhere within the cerebral white matter, nonspecific but compatible with chronic small vessel ischemic disease. Redemonstrated small chronic infarct within the right cerebellar hemisphere. There is no acute infarct. No evidence of an intracranial mass. No chronic intracranial blood products. No extra-axial fluid collection. No midline shift. Vascular: Maintained flow voids within the proximal large arterial vessels. Skull and upper cervical  spine: No focal suspicious marrow lesion. Sinuses/Orbits: No mass or acute finding within the imaged orbits. Small mucous retention cysts within the bilateral maxillary sinuses. IMPRESSION: 1.  No evidence of an acute intracranial abnormality. 2. Redemonstrated small chronic infarcts within the left corona radiata and right cerebellar hemisphere. 3. Background mild cerebral white matter chronic small vessel ischemic disease, similar to the prior brain MRI of 05/04/2020. 4. Small mucous retention cysts within the maxillary sinuses. Electronically Signed   By: Jackey Loge D.O.   On: 08/05/2022 16:50   NM Hepato W/EF  Result Date: 08/01/2022 CLINICAL DATA:  Right upper quadrant abdominal pain. EXAM: NUCLEAR MEDICINE HEPATOBILIARY IMAGING WITH GALLBLADDER EF TECHNIQUE: Sequential images of the abdomen were obtained out to 60 minutes following intravenous administration of radiopharmaceutical. After oral  ingestion of Ensure, gallbladder ejection fraction was determined. At 60 min, normal ejection fraction is greater than 33%. RADIOPHARMACEUTICALS:  5.08 mCi Tc-24m  Choletec IV COMPARISON:  Ultrasound Jul 26, 2022 FINDINGS: Prompt uptake and biliary excretion of activity by the liver is seen. Gallbladder activity is visualized, consistent with patency of cystic duct. Biliary activity passes into small bowel, consistent with patent common bile duct. Calculated gallbladder ejection fraction is 86%. (Normal gallbladder ejection fraction with Ensure is greater than 33%.) IMPRESSION: 1.  Patent cystic and common bile ducts. 2.  Normal gallbladder ejection fraction. Electronically Signed   By: Maudry Mayhew M.D.   On: 08/01/2022 13:06   CT Angio Chest/Abd/Pel for Dissection W and/or Wo Contrast  Result Date: 07/26/2022 CLINICAL DATA:  Upper abdominal and back pain. EXAM: CT ANGIOGRAPHY CHEST, ABDOMEN AND PELVIS TECHNIQUE: Non-contrast CT of the chest was initially obtained. Multidetector CT imaging through the chest, abdomen and pelvis was performed using the standard protocol during bolus administration of intravenous contrast. Multiplanar reconstructed images and MIPs were obtained and reviewed to evaluate the vascular anatomy. RADIATION DOSE REDUCTION: This exam was performed according to the departmental dose-optimization program which includes automated exposure control, adjustment of the mA and/or kV according to patient size and/or use of iterative reconstruction technique. CONTRAST:  OMNIPAQUE IOHEXOL 350 MG/ML SOLN COMPARISON:  February 02, 2022. FINDINGS: CTA CHEST FINDINGS Cardiovascular: Atherosclerosis of thoracic aorta is noted without aneurysm or dissection. Great vessels are widely patent. Normal cardiac size. No pericardial effusion. Mediastinum/Nodes: No enlarged mediastinal, hilar, or axillary lymph nodes. Thyroid gland, trachea, and esophagus demonstrate no significant findings. Lungs/Pleura:  Lungs are clear. No pleural effusion or pneumothorax. Musculoskeletal: Status post kyphoplasty of T11 and T10 vertebral bodies. Surgical posterior fusion is seen extending from T10 to S1. No acute osseous abnormality is noted. Review of the MIP images confirms the above findings. CTA ABDOMEN AND PELVIS FINDINGS VASCULAR Aorta: Atherosclerosis of abdominal aorta is noted without aneurysm or dissection. Celiac: Patent without evidence of aneurysm, dissection, vasculitis or significant stenosis. SMA: Patent without evidence of aneurysm, dissection, vasculitis or significant stenosis. Renals: Both renal arteries are patent without evidence of aneurysm, dissection, vasculitis, fibromuscular dysplasia or significant stenosis. IMA: Patent without evidence of aneurysm, dissection, vasculitis or significant stenosis. Inflow: Patent without evidence of aneurysm, dissection, vasculitis or significant stenosis. Veins: No obvious venous abnormality within the limitations of this arterial phase study. Review of the MIP images confirms the above findings. NON-VASCULAR Hepatobiliary: Hepatic steatosis. No cholelithiasis or biliary dilatation is noted. Pancreas: Unremarkable. No pancreatic ductal dilatation or surrounding inflammatory changes. Spleen: Normal in size without focal abnormality. Adrenals/Urinary Tract: Adrenal glands are unremarkable. Kidneys are normal, without renal calculi, focal lesion,  or hydronephrosis. Bladder is unremarkable. Stomach/Bowel: Stomach is unremarkable. There is no evidence of bowel obstruction or inflammation. The appendix is not clearly visualized. Lymphatic: No adenopathy is noted. Reproductive: Status post hysterectomy. No adnexal masses. Other: No abdominal wall hernia or abnormality. No abdominopelvic ascites. Musculoskeletal: Status post surgical posterior fusion extending from lower thoracic spine into S1. No acute osseous abnormality is noted. Review of the MIP images confirms the above  findings. IMPRESSION: No evidence of thoracic or abdominal aortic dissection or aneurysm. Status post surgical posterior fusion extending from T10-S1. Hepatic steatosis. Aortic Atherosclerosis (ICD10-I70.0). Electronically Signed   By: Lupita Raider M.D.   On: 07/26/2022 14:32   US Abdomen Limited RUQ (LIVER/GB)  Result Date: 07/26/2022 CLINICAL DATA:  [id number] RUQ abdominal pain [id number] EXAM: ULTRASOUND ABDOMEN LIMITED RIGHT UPPER QUADRANT COMPARISON:  None Available. FINDINGS: Gallbladder: No gallstones or wall thickening visualized. No sonographic Murphy sign noted by sonographer. Common bile duct: Diameter: 5 mm, which is normal. Liver: No focal lesion identified. Increased parenchymal echogenicity. Portal vein is patent on color Doppler imaging with normal direction of blood flow towards the liver. Other: None. IMPRESSION: 1. No sonographic finding to explain right upper quadrant pain. 2. Findings of hepatic steatosis. Electronically Signed   By: Lorenza Cambridge M.D.   On: 07/26/2022 12:58      Assessment and Plan: Patient Active Problem List   Diagnosis Date Noted   Nausea 08/02/2022   Headache 08/02/2022   Blister, face 08/02/2022   Epigastric pain 08/02/2022   Eye swelling, bilateral 07/28/2022   History of duodenal ulcer 06/15/2022   Pseudoarthrosis of thoracic spine after fusion procedure 04/17/2022   Other secondary kyphosis, thoracic region 04/17/2022   Compression fracture of T12 vertebra (HCC) 04/17/2022   Spinal instability, thoracic 04/17/2022   Abdominal wall bulge 01/31/2022   Iron deficiency anemia 01/31/2022   Constipation due to pain medication 12/28/2021   Post-operative pain 12/28/2021   Back pain at L4-L5 level 12/01/2021   Inguinal hernia of right side without obstruction or gangrene 11/15/2021   Umbilical hernia without obstruction or gangrene 11/15/2021   Coronary artery disease involving native coronary artery of native heart 10/07/2021   Other secondary  scoliosis, lumbar region 10/04/2021   Postlaminectomy syndrome of lumbar region 10/04/2021   Pedal edema 07/26/2021   Mild pulmonary hypertension (HCC) 07/26/2021   Hyperlipidemia 11/17/2020   S/P TKR (total knee replacement) using cement, left 10/12/2020   Somnolence, daytime 09/09/2020   Rash 09/09/2020   Medicare annual wellness visit, subsequent 08/25/2020   Encounter for screening mammogram for breast cancer 08/25/2020   Encounter for general adult medical examination with abnormal findings 08/25/2020   GERD (gastroesophageal reflux disease) 08/25/2020   Dizzy spells 08/25/2020   Migraine 03/12/2020   IBS (irritable bowel syndrome) 03/12/2020   S/P TKR (total knee replacement) using cement, right 01/01/2020   Colonic polyp 10/22/2019   History of vaginal hysterectomy 06/11/2019   Estrogen deficiency 04/22/2019   History of skin cancer 04/22/2019   High-tone pelvic floor dysfunction 04/03/2019   IC (interstitial cystitis) 04/03/2019   Neuralgia of both pudendal nerves 04/03/2019   Pelvic pain in female 04/03/2019   Osteoporosis 02/12/2019   Asthma 01/31/2019   Adverse reaction to influenza vaccine, initial encounter 12/03/2018   Vitamin D deficiency 12/03/2018   Restless legs syndrome 11/28/2018   Small fiber neuropathy 11/28/2018   Allergic rhinitis 10/08/2018   Benign essential hypertension 10/08/2018   Chronic bladder pain 10/08/2018   Neuropathy 10/08/2018  Osteoarthritis 10/08/2018   S/P BSO (bilateral salpingo-oophorectomy) 01/07/2018   OSA on CPAP 12/29/2016   Chronic obstructive pulmonary disease (HCC) 12/29/2016   Chronic dyspnea 06/24/2012   Other and unspecified disc disorder of lumbar region 10/25/2000   Diverticulosis of colon 10/25/2000   Prediabetes 09/24/1998   Tinnitus of both ears 09/24/1983    1. OSA on CPAP May look into oral appliance alternative due to mild OSA with cpap difficulty  2. CPAP use counseling CPAP couseling-Discussed importance  of adequate CPAP use as well as proper care and cleaning techniques of machine and all supplies.  3. Exercise-induced asthma Continue inhaler as prescribed   General Counseling: I have discussed the findings of the evaluation and examination with Erin Petersen.  I have also discussed any further diagnostic evaluation thatmay be needed or ordered today. Erin Petersen verbalizes understanding of the findings of todays visit. We also reviewed her medications today and discussed drug interactions and side effects including but not limited excessive drowsiness and altered mental states. We also discussed that there is always a risk not just to her but also people around her. she has been encouraged to call the office with any questions or concerns that should arise related to todays visit.  No orders of the defined types were placed in this encounter.    Time spent: 30  I have personally obtained a history, examined the patient, evaluated laboratory and imaging results, formulated the assessment and plan and placed orders. This patient was seen by Lynn Ito, PA-C in collaboration with Dr. Freda Munro as a part of collaborative care agreement.     Yevonne Pax, MD [facility] Pulmonary and Critical Care Sleep medicine

## 2022-08-07 NOTE — Addendum Note (Signed)
Addended by: Roxy Manns A on: 08/07/2022 09:42 PM   Modules accepted: Orders

## 2022-08-08 ENCOUNTER — Ambulatory Visit: Payer: Medicare Other | Admitting: Internal Medicine

## 2022-08-09 ENCOUNTER — Encounter: Payer: Self-pay | Admitting: *Deleted

## 2022-08-09 DIAGNOSIS — I10 Essential (primary) hypertension: Secondary | ICD-10-CM | POA: Diagnosis not present

## 2022-08-09 DIAGNOSIS — I517 Cardiomegaly: Secondary | ICD-10-CM | POA: Diagnosis not present

## 2022-08-09 DIAGNOSIS — Z7982 Long term (current) use of aspirin: Secondary | ICD-10-CM | POA: Diagnosis not present

## 2022-08-09 DIAGNOSIS — E785 Hyperlipidemia, unspecified: Secondary | ICD-10-CM | POA: Diagnosis not present

## 2022-08-14 ENCOUNTER — Ambulatory Visit
Admission: RE | Admit: 2022-08-14 | Discharge: 2022-08-14 | Disposition: A | Discharge status: Home / Self Care | Payer: Medicare Other | Attending: Orthopedic Surgery | Admitting: Orthopedic Surgery

## 2022-08-14 ENCOUNTER — Ambulatory Visit
Admission: RE | Admit: 2022-08-14 | Discharge: 2022-08-14 | Disposition: A | Discharge status: Home / Self Care | Payer: Medicare Other | Source: Ambulatory Visit | Attending: Orthopedic Surgery | Admitting: Orthopedic Surgery

## 2022-08-14 DIAGNOSIS — M4325 Fusion of spine, thoracolumbar region: Secondary | ICD-10-CM

## 2022-08-14 DIAGNOSIS — M4854XA Collapsed vertebra, not elsewhere classified, thoracic region, initial encounter for fracture: Secondary | ICD-10-CM | POA: Diagnosis not present

## 2022-08-14 DIAGNOSIS — W19XXXA Unspecified fall, initial encounter: Secondary | ICD-10-CM

## 2022-08-14 DIAGNOSIS — Z043 Encounter for examination and observation following other accident: Secondary | ICD-10-CM | POA: Diagnosis not present

## 2022-08-14 DIAGNOSIS — Z981 Arthrodesis status: Secondary | ICD-10-CM | POA: Diagnosis not present

## 2022-08-14 DIAGNOSIS — M545 Low back pain, unspecified: Secondary | ICD-10-CM | POA: Diagnosis not present

## 2022-08-17 DIAGNOSIS — Z961 Presence of intraocular lens: Secondary | ICD-10-CM | POA: Diagnosis not present

## 2022-08-17 DIAGNOSIS — H532 Diplopia: Secondary | ICD-10-CM | POA: Diagnosis not present

## 2022-08-17 DIAGNOSIS — R519 Headache, unspecified: Secondary | ICD-10-CM | POA: Diagnosis not present

## 2022-08-17 DIAGNOSIS — H5000 Unspecified esotropia: Secondary | ICD-10-CM | POA: Diagnosis not present

## 2022-08-20 ENCOUNTER — Other Ambulatory Visit: Payer: Self-pay | Admitting: Family Medicine

## 2022-08-21 NOTE — Telephone Encounter (Signed)
So sorry- I was confused about how she is taking this  Does she take 300 mg bid or 150 in am and 300 in pm ?   She got 90 of the 150 mg pills on 6/14 with would in that case be a 30 day supply if she takes 150 in am and 300 (two 150 mg pills in pm)   Thanks, let me know

## 2022-08-21 NOTE — Telephone Encounter (Signed)
Looking at past refills, PCP has not filled this med before. It's on med list as a historical entry. Last OV was for eye problems on 08/02/22

## 2022-08-22 ENCOUNTER — Other Ambulatory Visit: Payer: Self-pay

## 2022-08-22 MED ORDER — PREGABALIN 150 MG PO CAPS
ORAL_CAPSULE | ORAL | 0 refills | Status: DC
Start: 2022-08-22 — End: 2022-08-25

## 2022-08-22 MED ORDER — SUCRALFATE 1 G PO TABS: 1.0000 g | ORAL_TABLET | Freq: Three times a day (TID) | ORAL | 1 refills | Status: DC

## 2022-08-22 NOTE — Telephone Encounter (Signed)
She takes 150mg  in the morning and the 300mg  at bedtime.  She would like to receive [facility] day supply at [facility] and 90 day supply sent via [facility].   Please fill, if appropriate.

## 2022-08-22 NOTE — Telephone Encounter (Signed)
The best way to accomplish this is in 150s Take 1 in am and 2 at bedtime   If we send in 2 different strengths then ins will likely not cover both   I will go ahead and send the 150s to local pharmacy first   Let me know if this is ok and make sure ins covers it   Then send back and I will send to mail order   Thanks

## 2022-08-22 NOTE — Telephone Encounter (Signed)
Pt advised of medication recommendations, she will give [place] a call back once she goes to the pharmacy.

## 2022-08-24 ENCOUNTER — Encounter: Payer: Self-pay | Admitting: Family Medicine

## 2022-08-24 NOTE — Procedures (Signed)
[facility] [address] [place], [address]    Complete Pulmonary Function Testing Interpretation:  FINDINGS:  Forced vital capacity is mildly decreased.  FEV1 is 1.21 L which is 75% of predicted and is mildly decreased.  The FEV1 FVC ratio is mildly decreased.  Postbronchodilator there is no significant change in FEV1 clinical improvement may still occur in the absence of spirometric improvement.  Total lung capacity is normal residual volume is increased residual on total lung capacity ratio is increased.  FRC is normal.  DLCO is increased.  IMPRESSION:  This pulmonary function study is suggestive of mild obstructive lung disease.  Clinical correlation is recommended  Yevonne Pax, MD [facility] Pulmonary Critical Care Medicine Sleep Medicine

## 2022-08-25 ENCOUNTER — Telehealth: Payer: Self-pay | Admitting: *Deleted

## 2022-08-25 MED ORDER — PREGABALIN 150 MG PO CAPS: ORAL_CAPSULE | ORAL | 0 refills | Status: DC

## 2022-08-25 NOTE — Telephone Encounter (Signed)
I sent in the [facility] Does she still want a 90 day [facility]?

## 2022-08-25 NOTE — Telephone Encounter (Signed)
Sent message asking pt 

## 2022-08-25 NOTE — Telephone Encounter (Signed)
Pt sent a message because she picked up her Lyrica and the directions say take one tab in am and 2 tabs in PM but only 30 caps were sent so it wasn't enough for the full month.  [facility] [address]

## 2022-08-28 LAB — PULMONARY FUNCTION TEST

## 2022-09-12 ENCOUNTER — Encounter: Payer: Self-pay | Admitting: Pharmacist

## 2022-09-12 NOTE — Progress Notes (Signed)
Patient previously followed by [facility] pharmacist. Per clinical review, no pharmacist appointment needed at this time. Care guide directed to contact patient and cancel appointment and notify pharmacy team of any patient concerns.  

## 2022-09-13 DIAGNOSIS — N390 Urinary tract infection, site not specified: Secondary | ICD-10-CM | POA: Diagnosis not present

## 2022-09-14 ENCOUNTER — Encounter: Payer: Medicare Other | Admitting: Pharmacist

## 2022-09-29 ENCOUNTER — Telehealth: Payer: Self-pay | Admitting: Neurosurgery

## 2022-09-29 DIAGNOSIS — M960 Pseudarthrosis after fusion or arthrodesis: Secondary | ICD-10-CM

## 2022-09-29 DIAGNOSIS — M4325 Fusion of spine, thoracolumbar region: Secondary | ICD-10-CM

## 2022-09-29 NOTE — Telephone Encounter (Signed)
  Media Information  Document Information  I spoke to the patient this morning to get clarification on what exactly she was asking for. She states that aqua therapy did not work for her like she was expecting. The pool was too deep and cold. She would like to do regular PT at [facility] on [address]. Please send new order.

## 2022-09-29 NOTE — Telephone Encounter (Signed)
PT order previously placed by Duwayne Heck for aquatic therapy. Pt has requested to switch to regular physical therapy. Has follow up appointment with Dr Myer Haff 10/12/22. Referral placed.

## 2022-09-29 NOTE — Telephone Encounter (Signed)
Referral has been sent to [address]

## 2022-09-30 ENCOUNTER — Encounter: Payer: Self-pay | Admitting: Physician Assistant

## 2022-10-01 ENCOUNTER — Encounter: Payer: Self-pay | Admitting: Oncology

## 2022-10-02 ENCOUNTER — Other Ambulatory Visit: Payer: Medicare Other

## 2022-10-02 ENCOUNTER — Other Ambulatory Visit: Payer: Self-pay

## 2022-10-02 ENCOUNTER — Ambulatory Visit (INDEPENDENT_AMBULATORY_CARE_PROVIDER_SITE_OTHER): Payer: Medicare Other

## 2022-10-02 VITALS — Ht <= 58 in | Wt 164.0 lb

## 2022-10-02 DIAGNOSIS — M4325 Fusion of spine, thoracolumbar region: Secondary | ICD-10-CM

## 2022-10-02 DIAGNOSIS — Z0000 Encounter for general adult medical examination without abnormal findings: Secondary | ICD-10-CM | POA: Diagnosis not present

## 2022-10-02 DIAGNOSIS — M960 Pseudarthrosis after fusion or arthrodesis: Secondary | ICD-10-CM

## 2022-10-02 DIAGNOSIS — Z1231 Encounter for screening mammogram for malignant neoplasm of breast: Secondary | ICD-10-CM

## 2022-10-02 NOTE — Patient Instructions (Addendum)
Erin Petersen , Thank you for taking time to come for your Medicare Wellness Visit. I appreciate your ongoing commitment to your health goals. Please review the following plan we discussed and let me know if I can assist you in the future.   Referrals/Orders/Follow-Ups/Clinician Recommendations: Aim for 30 minutes of exercise or brisk walking, 6-8 glasses of  water, and 5 servings of fruits and vegetables each day.   You have an order for:  []   2D Mammogram  [x]   3D Mammogram  []   Bone Density     Please call for appointment:  [facility]  [address] [place] [address] [phone]  [facility] [address] [place], [place] [address] [phone]  [facility] [address] Geanie Logan [place], [place] [address] [phone]    Make sure to wear two-piece clothing.  No lotions, powders, or deodorants the day of the appointment. Make sure to bring picture ID and insurance card.  Bring list of medications you are currently taking including any supplements.   Schedule your [place] screening mammogram through MyChart!   Log into your MyChart account.  Go to 'Visit' (or 'Appointments' if on mobile App) --> Schedule an Appointment  Under 'Select a Reason for Visit' choose the Mammogram Screening option.  Complete the pre-visit questions and select the time and place that best fits your schedule.    ASSESSMENT:  This is a list of the screening recommended for you and due dates:  Health Maintenance  Topic Date Due   Yearly kidney health urinalysis for diabetes  Never done   Zoster (Shingles) Vaccine (1 of 2) Never done   DTaP/Tdap/Td vaccine (3 - Td or Tdap) 11/02/2011   Complete foot exam   08/25/2021   Mammogram  09/20/2021   COVID-19 Vaccine (5 - 2023-24 season) 10/28/2021   Eye exam for diabetics  11/04/2021   Medicare Annual Wellness  Visit  08/27/2022   Flu Shot  09/28/2022   Hemoglobin A1C  02/03/2023   Yearly kidney function blood test for diabetes  08/04/2023   Colon Cancer Screening  11/11/2023   Pneumonia Vaccine  Completed   DEXA scan (bone density measurement)  Completed   HPV Vaccine  Aged Out    Advanced directives: (Copy Requested) Please bring a copy of your health care power of attorney and living will to the office to be added to your chart at your convenience.  Next Medicare Annual Wellness Visit scheduled for next year: Yes  Preventive Care 67 Years and Older, Female Preventive care refers to lifestyle choices and visits with your health care provider that can promote health and wellness. What does preventive care include? A yearly physical exam. This is also called an annual well check. Dental exams once or twice a year. Routine eye exams. Ask your health care provider how often you should have your eyes checked. Personal lifestyle choices, including: Daily care of your teeth and gums. Regular physical activity. Eating a healthy diet. Avoiding tobacco and drug use. Limiting alcohol use. Practicing safe sex. Taking low-dose aspirin every day. Taking vitamin and mineral supplements as recommended by your health care provider. What happens during an annual well check? The services and screenings done by your health care provider during your annual well check will depend on your age, overall health, lifestyle risk factors, and family history of disease. Counseling  Your health care provider  may ask you questions about your: Alcohol use. Tobacco use. Drug use. Emotional well-being. Home and relationship well-being. Sexual activity. Eating habits. History of falls. Memory and ability to understand (cognition). Work and work Astronomer. Reproductive health. Screening  You may have the following tests or measurements: Height, weight, and BMI. Blood pressure. Lipid and cholesterol levels.  These may be checked every 5 years, or more frequently if you are over 11 years old. Skin check. Lung cancer screening. You may have this screening every year starting at age 44 if you have a 30-pack-year history of smoking and currently smoke or have quit within the past 15 years. Fecal occult blood test (FOBT) of the stool. You may have this test every year starting at age 29. Flexible sigmoidoscopy or colonoscopy. You may have a sigmoidoscopy every 5 years or a colonoscopy every 10 years starting at age 40. Hepatitis C blood test. Hepatitis B blood test. Sexually transmitted disease (STD) testing. Diabetes screening. This is done by checking your blood sugar (glucose) after you have not eaten for a while (fasting). You may have this done every 1-3 years. Bone density scan. This is done to screen for osteoporosis. You may have this done starting at age 9. Mammogram. This may be done every 1-2 years. Talk to your health care provider about how often you should have regular mammograms. Talk with your health care provider about your test results, treatment options, and if necessary, the need for more tests. Vaccines  Your health care provider may recommend certain vaccines, such as: Influenza vaccine. This is recommended every year. Tetanus, diphtheria, and acellular pertussis (Tdap, Td) vaccine. You may need a Td booster every 10 years. Zoster vaccine. You may need this after age 23. Pneumococcal 13-valent conjugate (PCV13) vaccine. One dose is recommended after age 75. Pneumococcal polysaccharide (PPSV23) vaccine. One dose is recommended after age 75. Talk to your health care provider about which screenings and vaccines you need and how often you need them. This information is not intended to replace advice given to you by your health care provider. Make sure you discuss any questions you have with your health care provider. Document Released: 03/12/2015 Document Revised: 11/03/2015 Document  Reviewed: 12/15/2014 Elsevier Interactive Patient Education  2017 [employer].  Fall Prevention in the Home Falls can cause injuries. They can happen to people of all ages. There are many things you can do to make your home safe and to help prevent falls. What can I do on the outside of my home? Regularly fix the edges of walkways and driveways and fix any cracks. Remove anything that might make you trip as you walk through a door, such as a raised step or threshold. Trim any bushes or trees on the path to your home. Use bright outdoor lighting. Clear any walking paths of anything that might make someone trip, such as rocks or tools. Regularly check to see if handrails are loose or broken. Make sure that both sides of any steps have handrails. Any raised decks and porches should have guardrails on the edges. Have any leaves, snow, or ice cleared regularly. Use sand or salt on walking paths during winter. Clean up any spills in your garage right away. This includes oil or grease spills. What can I do in the bathroom? Use night lights. Install grab bars by the toilet and in the tub and shower. Do not use towel bars as grab bars. Use non-skid mats or decals in the tub or shower. If you  need to sit down in the shower, use a plastic, non-slip stool. Keep the floor dry. Clean up any water that spills on the floor as soon as it happens. Remove soap buildup in the tub or shower regularly. Attach bath mats securely with double-sided non-slip rug tape. Do not have throw rugs and other things on the floor that can make you trip. What can I do in the bedroom? Use night lights. Make sure that you have a light by your bed that is easy to reach. Do not use any sheets or blankets that are too big for your bed. They should not hang down onto the floor. Have a firm chair that has side arms. You can use this for support while you get dressed. Do not have throw rugs and other things on the floor that can  make you trip. What can I do in the kitchen? Clean up any spills right away. Avoid walking on wet floors. Keep items that you use a lot in easy-to-reach places. If you need to reach something above you, use a strong step stool that has a grab bar. Keep electrical cords out of the way. Do not use floor polish or wax that makes floors slippery. If you must use wax, use non-skid floor wax. Do not have throw rugs and other things on the floor that can make you trip. What can I do with my stairs? Do not leave any items on the stairs. Make sure that there are handrails on both sides of the stairs and use them. Fix handrails that are broken or loose. Make sure that handrails are as long as the stairways. Check any carpeting to make sure that it is firmly attached to the stairs. Fix any carpet that is loose or worn. Avoid having throw rugs at the top or bottom of the stairs. If you do have throw rugs, attach them to the floor with carpet tape. Make sure that you have a light switch at the top of the stairs and the bottom of the stairs. If you do not have them, ask someone to add them for you. What else can I do to help prevent falls? Wear shoes that: Do not have high heels. Have rubber bottoms. Are comfortable and fit you well. Are closed at the toe. Do not wear sandals. If you use a stepladder: Make sure that it is fully opened. Do not climb a closed stepladder. Make sure that both sides of the stepladder are locked into place. Ask someone to hold it for you, if possible. Clearly mark and make sure that you can see: Any grab bars or handrails. First and last steps. Where the edge of each step is. Use tools that help you move around (mobility aids) if they are needed. These include: Canes. Walkers. Scooters. Crutches. Turn on the lights when you go into a dark area. Replace any light bulbs as soon as they burn out. Set up your furniture so you have a clear path. Avoid moving your furniture  around. If any of your floors are uneven, fix them. If there are any pets around you, be aware of where they are. Review your medicines with your doctor. Some medicines can make you feel dizzy. This can increase your chance of falling. Ask your doctor what other things that you can do to help prevent falls. This information is not intended to replace advice given to you by your health care provider. Make sure you discuss any questions you have with your  health care provider. Document Released: 12/10/2008 Document Revised: 07/22/2015 Document Reviewed: 03/20/2014 Elsevier Interactive Patient Education  2017 [employer].

## 2022-10-02 NOTE — Progress Notes (Signed)
Subjective:   Erin Petersen is a 82 y.o. female who presents for Medicare Annual (Subsequent) preventive examination.  Visit Complete: Virtual  I connected with  Erin Petersen on 10/02/22 by a audio enabled telemedicine application and verified that I am speaking with the correct person using two identifiers.  Patient Location: Home  Provider Location: Office/Clinic  I discussed the limitations of evaluation and management by telemedicine. The patient expressed understanding and agreed to proceed.  Patient Medicare AWV questionnaire was completed by the patient on 10/01/22; I have confirmed that all information answered by patient is correct and no changes since this date.  Vital Signs: Unable to obtain new vitals due to this being a telehealth visit.   Review of Systems      Cardiac Risk Factors include: advanced age (>74men, >27 women);diabetes mellitus;dyslipidemia;hypertension;sedentary lifestyle     Objective:    Today's Vitals   10/01/22 0836  Weight: 164 lb (74.4 kg)  Height: 4\' 10"  (1.473 m)  PainSc: 5    Body mass index is 34.28 kg/m.     10/02/2022    9:25 AM 07/28/2022    3:42 PM 07/26/2022   10:10 AM 06/30/2022    1:09 PM 06/28/2022    1:00 PM 06/16/2022    1:23 PM 06/15/2022    9:40 AM  Advanced Directives  Does Patient Have a Medical Advance Directive? Yes Yes Yes Yes Yes Yes Yes  Type of Estate agent of [place];Living will Healthcare Power of [place];Living will Healthcare Power of [place];Living will Healthcare Power of [place];Living will Healthcare Power of [place];Living will Healthcare Power of [place];Living will Healthcare Power of [place];Living will  Does patient want to make changes to medical advance directive?    No - Patient declined No - Patient declined    Copy of Healthcare Power of Attorney in Chart? No - copy requested    No - copy requested      Current Medications (verified) Outpatient Encounter  Medications as of 10/02/2022  Medication Sig   albuterol (VENTOLIN HFA) 108 (90 Base) MCG/ACT inhaler Inhale 2 puffs into the lungs every 6 (six) hours as needed for wheezing or shortness of breath.   amitriptyline (ELAVIL) 25 MG tablet TAKE 1 TABLET AT BEDTIME   amLODipine (NORVASC) 5 MG tablet Take 1 tablet (5 mg total) by mouth daily.   aspirin 81 MG EC tablet Take 81 mg by mouth at bedtime.    bisacodyl (DULCOLAX) 5 MG EC tablet Take 1-2 tablets (5-10 mg total) by mouth daily as needed for moderate constipation.   calcium carbonate (TUMS EX) 750 MG chewable tablet Chew 3 tablets by mouth daily.   Cholecalciferol (VITAMIN D3) 50 MCG (2000 UT) TABS Take 2,000 Units by mouth daily.   Cyanocobalamin (VITAMIN B-12) 1000 MCG SUBL Take 2,000 mcg by mouth daily.   EPINEPHrine 0.3 mg/0.3 mL IJ SOAJ injection Inject 0.3 mg into the muscle as needed for anaphylaxis.   fluticasone (FLONASE) 50 MCG/ACT nasal spray Place 2 sprays into both nostrils daily. qhs   hydrochlorothiazide (HYDRODIURIL) 25 MG tablet TAKE 1 TABLET DAILY   Melatonin 10 MG CAPS Take 1 capsule by mouth as needed.   methocarbamol (ROBAXIN) 500 MG tablet Take 1 tablet (500 mg total) by mouth every 6 (six) hours as needed for muscle spasms.   omeprazole (PRILOSEC) 40 MG capsule Take 1 capsule (40 mg total) by mouth 2 (two) times daily before a meal. (Patient taking differently: Take 40 mg by mouth  daily.)   polyethylene glycol (MIRALAX / GLYCOLAX) 17 g packet Take 1 packet by mouth daily.    pregabalin (LYRICA) 150 MG capsule Take one by mouth in am and 2 at bedtime   rOPINIRole (REQUIP) 1 MG tablet Take 1 mg by mouth in the morning and at bedtime. 1 in am and 1 at 3pm   rOPINIRole (REQUIP) 4 MG tablet TAKE 1 TABLET AT BEDTIME   simvastatin (ZOCOR) 20 MG tablet TAKE 1 TABLET AT BEDTIME   CALCIUM-VITAMIN D-VITAMIN K PO Take 750 mg by mouth daily. (Patient not taking: Reported on 10/02/2022)   celecoxib (CELEBREX) 200 MG capsule Take 1  capsule (200 mg total) by mouth 2 (two) times daily. (Patient not taking: Reported on 10/02/2022)   ondansetron (ZOFRAN-ODT) 4 MG disintegrating tablet Take 1 tablet (4 mg total) by mouth every 8 (eight) hours as needed for nausea or vomiting. (Patient not taking: Reported on 10/02/2022)   sucralfate (CARAFATE) 1 g tablet Take 1 tablet (1 g total) by mouth 4 (four) times daily -  with meals and at bedtime. (Patient not taking: Reported on 10/02/2022)   tiZANidine (ZANAFLEX) 4 MG tablet Take 4 mg by mouth 3 (three) times daily. (Patient not taking: Reported on 10/02/2022)   traMADol (ULTRAM) 50 MG tablet Take 1 tablet (50 mg total) by mouth every 12 (twelve) hours as needed for severe pain. (Patient not taking: Reported on 10/02/2022)   No facility-administered encounter medications on file as of 10/02/2022.    Allergies (verified) Bee venom, Fosamax [alendronate], Fusion plus [iron-fa-b cmp-c-biot-probiotic], Ciprofloxacin hcl, and Sulfa antibiotics   History: Past Medical History:  Diagnosis Date   Anemia    Arthritis    Asthma    Bacteremia due to Gram-negative bacteria 06/04/2019   CAD (coronary artery disease) 07/21/2016   a.) LHC 07/21/2016: mild nonobstructive CAD   Colon polyps    Complication of anesthesia    a.) anesthesia awareness   Compression fracture of L1 lumbar vertebra (HCC)    COPD (chronic obstructive pulmonary disease) (HCC)    Diastolic dysfunction    a.) TTE 03/02/2021: EF >55%, mild LVH, mild LAE, triv MR, mild TR/PR, G1DD   Duodenal ulcer    Dyspnea    GERD (gastroesophageal reflux disease)    History of 2019 novel coronavirus disease (COVID-19) 01/19/2021   a.) tested (+) at [facility]   History of lumbar fusion    a.) T12-S1   Hyperlipidemia    Hypertension    Insomnia    a.) takes melatonin PRN   OSA on CPAP    Osteopenia    Peripheral neuropathy    Plantar fasciitis    Pneumonia    Pulmonary hypertension (HCC)    a.) TTE 06/04/2019: moderate (PASP 40.3  mmHg)   Right inguinal hernia    a.) s/p repair 12/2016   RLS (restless legs syndrome)    a.) on ropinirole   SOB (shortness of breath)    T12 compression fracture (HCC)    Umbilical hernia    a.) s/p repair 12/2016   Urinary incontinence    Urinary tract infection    Wears hearing aid in both ears    Past Surgical History:  Procedure Laterality Date   ABDOMINAL HYSTERECTOMY  1972   APPENDECTOMY  1958   APPLICATION OF INTRAOPERATIVE CT SCAN N/A 04/17/2022   Procedure: APPLICATION OF INTRAOPERATIVE CT SCAN;  Surgeon: Venetia Night, MD;  Location: [facility];  Service: Neurosurgery;  Laterality: N/A;   BILATERAL SALPINGOOPHORECTOMY  01/07/2018   BIOPSY  04/15/2019   Procedure: BIOPSY;  Surgeon: Toney Reil, MD;  Location: [facility];  Service: Endoscopy;;   CARDIAC CATHETERIZATION Left 07/21/2016   COLONOSCOPY WITH PROPOFOL N/A 11/11/2018   Procedure: COLONOSCOPY WITH PROPOFOL;  Surgeon: Toney Reil, MD;  Location: [facility];  Service: Gastroenterology;  Laterality: N/A;   CYSTO WITH HYDRODISTENSION N/A 12/30/2018   Procedure: CYSTOSCOPY/HYDRODISTENSION WITH MARCAINE;  Surgeon: Alfredo Martinez, MD;  Location: [facility];  Service: Urology;  Laterality: N/A;   ESOPHAGOGASTRODUODENOSCOPY (EGD) WITH PROPOFOL N/A 04/15/2019   Procedure: ESOPHAGOGASTRODUODENOSCOPY (EGD) WITH PROPOFOL;  Surgeon: Toney Reil, MD;  Location: [facility];  Service: Endoscopy;  Laterality: N/A;  sleep apnea   ESOPHAGOGASTRODUODENOSCOPY (EGD) WITH PROPOFOL N/A 07/09/2019   Procedure: ESOPHAGOGASTRODUODENOSCOPY (EGD) WITH PROPOFOL;  Surgeon: Toney Reil, MD;  Location: [facility];  Service: Gastroenterology;  Laterality: N/A;   ESOPHAGOGASTRODUODENOSCOPY (EGD) WITH PROPOFOL N/A 06/15/2022   Procedure: ESOPHAGOGASTRODUODENOSCOPY (EGD) WITH PROPOFOL;  Surgeon: Toney Reil, MD;  Location: [facility];  Service: Endoscopy;  Laterality: N/A;   sleep apnea   EYE SURGERY Bilateral    INGUINAL HERNIA REPAIR Bilateral 01/05/2017   JOINT REPLACEMENT     LAPAROSCOPIC BILATERAL SALPINGO OOPHERECTOMY  2019   LUMBAR FUSION  2002   LUMBAR FUSION  11/14/2021   Dr Sharolyn Douglas   LUMBAR FUSION  12/14/2021   Dr Malachy Chamber   SPINAL FUSION  2002   TONSILLECTOMY AND ADENOIDECTOMY  1961   TOTAL KNEE ARTHROPLASTY Right 01/01/2020   Procedure: Right total knee arthroplasty - [place] Neighbor to Assist;  Surgeon: Kennedy Bucker, MD;  Location: [facility];  Service: Orthopedics;  Laterality: Right;   TOTAL KNEE ARTHROPLASTY Left 10/12/2020   Procedure: TOTAL KNEE ARTHROPLASTY;  Surgeon: Kennedy Bucker, MD;  Location: [facility];  Service: Orthopedics;  Laterality: Left;   TUBAL LIGATION     UMBILICAL HERNIA REPAIR N/A 01/05/2017   Family History  Problem Relation Age of Onset   Alcohol abuse Mother    Cancer Mother    Stroke Mother    Alcohol abuse Father    Arthritis Sister    Arthritis Daughter    COPD Daughter    Arthritis Paternal Grandmother    Asthma Paternal Grandmother    COPD Paternal Grandmother    Breast cancer Paternal Aunt    Social History   Socioeconomic History   Marital status: Widowed    Spouse name: Not on file   Number of children: 3   Years of education: Not on file   Highest education level: Associate degree: academic program  Occupational History   Not on file  Tobacco Use   Smoking status: Never   Smokeless tobacco: Never  Vaping Use   Vaping status: Never Used  Substance and Sexual Activity   Alcohol use: Not Currently   Drug use: Never   Sexual activity: Not Currently  Other Topics Concern   Not on file  Social History Narrative   Moved from [place] 2020, lives near daughter. Retired. Enjoys walking. Lives alone   Social Determinants of Health   Financial Resource Strain: Medium Risk (10/02/2022)   Overall Financial Resource Strain (CARDIA)    Difficulty of Paying Living Expenses: Somewhat hard  Food  Insecurity: No Food Insecurity (10/02/2022)   Hunger Vital Sign    Worried About Running Out of Food in the Last Year: Never true    Ran Out of Food in the Last Year: Never true  Transportation Needs: No Transportation Needs (10/02/2022)   PRAPARE - Administrator, Civil Service (Medical): No    Lack of Transportation (Non-Medical): No  Physical Activity: Insufficiently Active (10/02/2022)   Exercise Vital Sign    Days of Exercise per Week: 5 days    Minutes of Exercise per Session: 10 min  Stress: No Stress Concern Present (10/02/2022)   [employer] of Occupational Health - Occupational Stress Questionnaire    Feeling of Stress : Not at all  Social Connections: Moderately Integrated (10/02/2022)   Social Connection and Isolation Panel [NHANES]    Frequency of Communication with Friends and Family: More than three times a week    Frequency of Social Gatherings with Friends and Family: More than three times a week    Attends Religious Services: More than 4 times per year    Active Member of [employer] or Organizations: Yes    Attends Banker Meetings: More than 4 times per year    Marital Status: Widowed    Tobacco Counseling Counseling given: Not Answered   Clinical Intake:  Pre-visit preparation completed: Yes  Pain : 0-10 Pain Score: 5  Pain Type: Chronic pain Pain Location: Back Pain Orientation: Lower, Medial Pain Descriptors / Indicators: Aching Pain Onset: More than a month ago     BMI - recorded: 34.28 Nutritional Status: BMI > 30  Obese Nutritional Risks: None Diabetes: No (not diabetic per pt) CBG done?: No Did pt. bring in CBG monitor from home?: No  How often do you need to have someone help you when you read instructions, pamphlets, or other written materials from your doctor or pharmacy?: 1 - Never  Interpreter Needed?: No  Information entered by :: C.Latavion Halls lpn   Activities of Daily Living    10/01/2022    8:36 AM 06/15/2022     9:27 AM  In your present state of health, do you have any difficulty performing the following activities:  Hearing? 1 0  Comment aids   Vision? 1 0  Comment reading is difficult   Difficulty concentrating or making decisions? 0 0  Walking or climbing stairs? 1 0  Comment due to knees   Dressing or bathing? 0 0  Doing errands, shopping? 0   Preparing Food and eating ? N   Using the Toilet? N   In the past six months, have you accidently leaked urine? Y   Comment Occasionally at night   Do you have problems with loss of bowel control? N   Managing your Medications? N   Managing your Finances? N   Housekeeping or managing your Housekeeping? Y   Comment has someone come to help clean.     Patient Care Team: [place], Audrie Gallus, MD as PCP - General (Family Medicine) Kathyrn Sheriff, [facility] (Inactive) as Pharmacist (Pharmacist)  Indicate any recent Medical Services you may have received from other than [facility] providers in the past year (date may be approximate).     Assessment:   This is a routine wellness examination for [place].  Hearing/Vision screen Hearing Screening - Comments:: Wears hearing aids Vision Screening - Comments:: Glasses - Has new prescription to get filled. But can see ok with current rx except reading is a bit difficult - [facility] - UTD on eye exams.  Dietary issues and exercise activities discussed:     Goals Addressed             This Visit's Progress    Patient Stated  Lose weight.       Depression Screen    10/02/2022    9:25 AM 08/02/2022   12:49 PM 06/16/2022    2:12 PM 08/26/2021   12:41 PM 09/10/2020   11:20 AM 08/25/2020    9:34 AM 04/08/2020   10:52 AM  PHQ 2/9 Scores  PHQ - 2 Score 0 1 1 0 0 0 0  PHQ- 9 Score  5         Fall Risk    10/01/2022    8:36 AM 08/02/2022   12:48 PM 08/26/2021   12:48 PM 08/25/2021    3:55 PM 09/10/2020   11:20 AM  Fall Risk   Falls in the past year? 1 1 1 1 1   Number falls in past yr: 1 0 0 0 0   Comment fell out of bed after back surgery, Tripped over beach bag second time.      Injury with Fall? 0 0 0 0 0  Comment   No injury or medical attention needed    Risk for fall due to : No Fall Risks No Fall Risks No Fall Risks    Follow up Falls evaluation completed;Education provided;Falls prevention discussed Falls evaluation completed       MEDICARE RISK AT HOME:  Medicare Risk at Home - 10/01/22 0836     If so, are there any without handrails? No             TIMED UP AND GO:  Was the test performed?  No    Cognitive Function:    04/17/2019    9:30 AM  MMSE - Mini Mental State Exam  Orientation to time 5  Orientation to Place 5  Registration 3  Attention/ Calculation 5  Recall 3  Language- repeat 1        10/02/2022    9:34 AM 08/26/2021   12:50 PM  6CIT Screen  What Year? 0 points 0 points  What month? 0 points 0 points  What time? 0 points 0 points  Count back from 20 0 points 0 points  Months in reverse 0 points 0 points  Repeat phrase 0 points 0 points  Total Score 0 points 0 points    Immunizations Immunization History  Administered Date(s) Administered   Fluad Quad(high Dose 65+) 12/03/2018   Influenza Split 02/22/2001   Influenza,inj,Quad PF,6+ Mos 03/12/2020, 11/17/2020   Influenza-Unspecified 11/27/2017   PFIZER(Purple Top)SARS-COV-2 Vaccination 03/06/2019, 03/29/2019, 10/31/2019   PNEUMOCOCCAL CONJUGATE-20 08/25/2020   PPD Test 02/27/2014   Pfizer Covid-19 Vaccine Bivalent Booster 45yrs & up 08/25/2020   Pneumococcal Polysaccharide-23 10/26/1999   Td 11/01/2001   Tdap 11/01/2001    TDAP status: Due, Education has been provided regarding the importance of this vaccine. Advised may receive this vaccine at local pharmacy or Health Dept. Aware to provide a copy of the vaccination record if obtained from local pharmacy or Health Dept. Verbalized acceptance and understanding.  Flu Vaccine status: Due, Education has been provided regarding the  importance of this vaccine. Advised may receive this vaccine at local pharmacy or Health Dept. Aware to provide a copy of the vaccination record if obtained from local pharmacy or Health Dept. Verbalized acceptance and understanding.  Pneumococcal vaccine status: Up to date  Covid-19 vaccine status: Information provided on how to obtain vaccines.   Qualifies for Shingles Vaccine? Yes   Zostavax completed Yes   Shingrix Completed?: No.    Education has been provided regarding the importance of this vaccine.  Patient has been advised to call insurance company to determine out of pocket expense if they have not yet received this vaccine. Advised may also receive vaccine at local pharmacy or Health Dept. Verbalized acceptance and understanding.  Screening Tests Health Maintenance  Topic Date Due   Diabetic kidney evaluation - Urine ACR  Never done   Zoster Vaccines- Shingrix (1 of 2) Never done   DTaP/Tdap/Td (3 - Td or Tdap) 11/02/2011   FOOT EXAM  08/25/2021   MAMMOGRAM  09/20/2021   COVID-19 Vaccine (5 - 2023-24 season) 10/28/2021   OPHTHALMOLOGY EXAM  11/04/2021   INFLUENZA VACCINE  09/28/2022   HEMOGLOBIN A1C  02/03/2023   Diabetic kidney evaluation - eGFR measurement  08/04/2023   Medicare Annual Wellness (AWV)  10/02/2023   Colonoscopy  11/11/2023   Pneumonia Vaccine 48+ Years old  Completed   DEXA SCAN  Completed   HPV VACCINES  Aged Out    Health Maintenance  Health Maintenance Due  Topic Date Due   Diabetic kidney evaluation - Urine ACR  Never done   Zoster Vaccines- Shingrix (1 of 2) Never done   DTaP/Tdap/Td (3 - Td or Tdap) 11/02/2011   FOOT EXAM  08/25/2021   MAMMOGRAM  09/20/2021   COVID-19 Vaccine (5 - 2023-24 season) 10/28/2021   OPHTHALMOLOGY EXAM  11/04/2021   INFLUENZA VACCINE  09/28/2022    Colorectal cancer screening: No longer required.   Mammogram status: Ordered 10/02/22. Pt provided with contact info and advised to call to schedule appt.   Bone  Density - Declined  Lung Cancer Screening: (Low Dose CT Chest recommended if Age 60-80 years, 20 pack-year currently smoking OR have quit w/in 15years.) does not qualify.   Lung Cancer Screening Referral: no  Additional Screening:  Hepatitis C Screening: does not qualify; Completed no  Vision Screening: Recommended annual ophthalmology exams for early detection of glaucoma and other disorders of the eye. Is the patient up to date with their annual eye exam?  Yes  Who is the provider or what is the name of the office in which the patient attends annual eye exams? [facility] If pt is not established with a provider, would they like to be referred to a provider to establish care? Yes .   Dental Screening: Recommended annual dental exams for proper oral hygiene   Community Resource Referral / Chronic Care Management: CRR required this visit?  No   CCM required this visit?  No     Plan:     I have personally reviewed and noted the following in the patient's chart:   Medical and social history Use of alcohol, tobacco or illicit drugs  Current medications and supplements including opioid prescriptions. Patient is not currently taking opioid prescriptions. Functional ability and status Nutritional status Physical activity Advanced directives List of other physicians Hospitalizations, surgeries, and ER visits in previous 12 months Vitals Screenings to include cognitive, depression, and falls Referrals and appointments  In addition, I have reviewed and discussed with patient certain preventive protocols, quality metrics, and best practice recommendations. A written personalized care plan for preventive services as well as general preventive health recommendations were provided to patient.     Maryan Puls, LPN   03/04/1094   After Visit Summary: (MyChart) Due to this being a telephonic visit, the after visit summary with patients personalized plan was offered to patient  via MyChart   Nurse Notes: None

## 2022-10-04 ENCOUNTER — Inpatient Hospital Stay: Payer: Medicare Other | Attending: Oncology

## 2022-10-04 DIAGNOSIS — D509 Iron deficiency anemia, unspecified: Secondary | ICD-10-CM | POA: Insufficient documentation

## 2022-10-04 LAB — IRON AND TIBC
Iron: 63 ug/dL (ref 28–170)
Saturation Ratios: 17 % (ref 10.4–31.8)
TIBC: 363 ug/dL (ref 250–450)
UIBC: 300 ug/dL

## 2022-10-04 LAB — CBC
HCT: 42.4 % (ref 36.0–46.0)
Hemoglobin: 13.8 g/dL (ref 12.0–15.0)
MCH: 25.7 pg — ABNORMAL LOW (ref 26.0–34.0)
MCHC: 32.5 g/dL (ref 30.0–36.0)
MCV: 79.1 fL — ABNORMAL LOW (ref 80.0–100.0)
Platelets: 298 10*3/uL (ref 150–400)
RBC: 5.36 MIL/uL — ABNORMAL HIGH (ref 3.87–5.11)
RDW: 17.2 % — ABNORMAL HIGH (ref 11.5–15.5)
WBC: 5.4 10*3/uL (ref 4.0–10.5)
nRBC: 0 % (ref 0.0–0.2)

## 2022-10-04 LAB — FERRITIN: Ferritin: 44 ng/mL (ref 11–307)

## 2022-10-05 ENCOUNTER — Encounter: Payer: Self-pay | Admitting: Physician Assistant

## 2022-10-05 ENCOUNTER — Ambulatory Visit: Payer: Medicare Other | Admitting: Physician Assistant

## 2022-10-05 VITALS — BP 139/68 | HR 67 | Temp 97.8°F | Resp 16 | Ht 59.0 in | Wt 167.0 lb

## 2022-10-05 DIAGNOSIS — G4733 Obstructive sleep apnea (adult) (pediatric): Secondary | ICD-10-CM

## 2022-10-05 DIAGNOSIS — Z7189 Other specified counseling: Secondary | ICD-10-CM

## 2022-10-05 NOTE — Progress Notes (Signed)
[facility] [address] [place], [place] [address]  Pulmonary Sleep Medicine   Office Visit Note  Patient Name: Erin Petersen DOB: [birth year] MRN [id number]  Date of Service: 10/10/2022  Complaints/HPI: Pt is here for routine follow up for OSA. Had stopped cpap due to headaches after use and did well for about 3 weeks, but then started having symptoms again. Going to be restarting CPAP now. Had looked into oral appliance but costly and reviews not great and would rather start again with cpap as she has used previously without a problem. Will have download prior to follow up to ensure apnea controlled.  ROS  General: (-) fever, (-) chills, (-) night sweats, (-) weakness Skin: (-) rashes, (-) itching,. Eyes: (-) visual changes, (-) redness, (-) itching. Nose and Sinuses: (-) nasal stuffiness or itchiness, (-) postnasal drip, (-) nosebleeds, (-) sinus trouble. Mouth and Throat: (-) sore throat, (-) hoarseness. Neck: (-) swollen glands, (-) enlarged thyroid, (-) neck pain. Respiratory: - cough, (-) bloody sputum, - shortness of breath, - wheezing. Cardiovascular: - ankle swelling, (-) chest pain. Lymphatic: (-) lymph node enlargement. Neurologic: (-) numbness, (-) tingling. Psychiatric: (-) anxiety, (-) depression   Current Medication: Outpatient Encounter Medications as of 10/05/2022  Medication Sig   albuterol (VENTOLIN HFA) 108 (90 Base) MCG/ACT inhaler Inhale 2 puffs into the lungs every 6 (six) hours as needed for wheezing or shortness of breath.   amitriptyline (ELAVIL) 25 MG tablet TAKE 1 TABLET AT BEDTIME   amLODipine (NORVASC) 5 MG tablet Take 1 tablet (5 mg total) by mouth daily.   aspirin 81 MG EC tablet Take 81 mg by mouth at bedtime.    bisacodyl (DULCOLAX) 5 MG EC tablet Take 1-2 tablets (5-10 mg total) by mouth daily as needed for moderate constipation.   CALCIUM-VITAMIN D-VITAMIN K PO Take 750 mg by mouth daily.   Cholecalciferol (VITAMIN D3) 50 MCG  (2000 UT) TABS Take 2,000 Units by mouth daily.   Cyanocobalamin (VITAMIN B-12) 1000 MCG SUBL Take 2,000 mcg by mouth daily.   EPINEPHrine 0.3 mg/0.3 mL IJ SOAJ injection Inject 0.3 mg into the muscle as needed for anaphylaxis.   fluticasone (FLONASE) 50 MCG/ACT nasal spray Place 2 sprays into both nostrils daily. qhs   hydrochlorothiazide (HYDRODIURIL) 25 MG tablet TAKE 1 TABLET DAILY   Melatonin 10 MG CAPS Take 1 capsule by mouth as needed.   methocarbamol (ROBAXIN) 500 MG tablet Take 1 tablet (500 mg total) by mouth every 6 (six) hours as needed for muscle spasms.   omeprazole (PRILOSEC) 40 MG capsule Take 1 capsule (40 mg total) by mouth 2 (two) times daily before a meal. (Patient taking differently: Take 40 mg by mouth daily.)   ondansetron (ZOFRAN-ODT) 4 MG disintegrating tablet Take 1 tablet (4 mg total) by mouth every 8 (eight) hours as needed for nausea or vomiting.   polyethylene glycol (MIRALAX / GLYCOLAX) 17 g packet Take 1 packet by mouth daily.    pregabalin (LYRICA) 150 MG capsule Take one by mouth in am and 2 at bedtime   rOPINIRole (REQUIP) 1 MG tablet Take 1 mg by mouth in the morning and at bedtime. 1 in am and 1 at 3pm   rOPINIRole (REQUIP) 4 MG tablet TAKE 1 TABLET AT BEDTIME   simvastatin (ZOCOR) 20 MG tablet TAKE 1 TABLET AT BEDTIME   sucralfate (CARAFATE) 1 g tablet Take 1 tablet (1 g total) by mouth 4 (four) times daily -  with meals and at  bedtime.   tiZANidine (ZANAFLEX) 4 MG tablet Take 4 mg by mouth 3 (three) times daily.   traMADol (ULTRAM) 50 MG tablet Take 1 tablet (50 mg total) by mouth every 12 (twelve) hours as needed for severe pain.   [DISCONTINUED] calcium carbonate (TUMS EX) 750 MG chewable tablet Chew 3 tablets by mouth daily.   [DISCONTINUED] celecoxib (CELEBREX) 200 MG capsule Take 1 capsule (200 mg total) by mouth 2 (two) times daily. (Patient not taking: Reported on 10/02/2022)   No facility-administered encounter medications on file as of 10/05/2022.     Surgical History: Past Surgical History:  Procedure Laterality Date   ABDOMINAL HYSTERECTOMY  1972   APPENDECTOMY  1958   APPLICATION OF INTRAOPERATIVE CT SCAN N/A 04/17/2022   Procedure: APPLICATION OF INTRAOPERATIVE CT SCAN;  Surgeon: Venetia Night, MD;  Location: [facility] ORS;  Service: Neurosurgery;  Laterality: N/A;   BILATERAL SALPINGOOPHORECTOMY  01/07/2018   BIOPSY  04/15/2019   Procedure: BIOPSY;  Surgeon: Toney Reil, MD;  Location: [facility];  Service: Endoscopy;;   CARDIAC CATHETERIZATION Left 07/21/2016   COLONOSCOPY WITH PROPOFOL N/A 11/11/2018   Procedure: COLONOSCOPY WITH PROPOFOL;  Surgeon: Toney Reil, MD;  Location: [facility] END[facility]COPY;  Service: Gastroenterology;  Laterality: N/A;   CYSTO WITH HYDRODISTENSION N/A 12/30/2018   Procedure: CYSTOSCOPY/HYDRODISTENSION WITH MARCAINE;  Surgeon: Alfredo Martinez, MD;  Location: [facility] ORS;  Service: Urology;  Laterality: N/A;   ESOPHAGOGASTRODUODENOSCOPY (EGD) WITH PROPOFOL N/A 04/15/2019   Procedure: ESOPHAGOGASTRODUODENOSCOPY (EGD) WITH PROPOFOL;  Surgeon: Toney Reil, MD;  Location: [facility];  Service: Endoscopy;  Laterality: N/A;  sleep apnea   ESOPHAGOGASTRODUODENOSCOPY (EGD) WITH PROPOFOL N/A 07/09/2019   Procedure: ESOPHAGOGASTRODUODENOSCOPY (EGD) WITH PROPOFOL;  Surgeon: Toney Reil, MD;  Location: [facility] ENDOSCOPY;  Service: Gastroenterology;  Laterality: N/A;   ESOPHAGOGASTRODUODENOSCOPY (EGD) WITH PROPOFOL N/A 06/15/2022   Procedure: ESOPHAGOGASTRODUODENOSCOPY (EGD) WITH PROPOFOL;  Surgeon: Toney Reil, MD;  Location: [facility];  Service: Endoscopy;  Laterality: N/A;  sleep apnea   EYE SURGERY Bilateral    INGUINAL HERNIA REPAIR Bilateral 01/05/2017   JOINT REPLACEMENT     LAPAROSCOPIC BILATERAL SALPINGO OOPHERECTOMY  2019   LUMBAR FUSION  2002   LUMBAR FUSION  11/14/2021   Dr Sharolyn Douglas   LUMBAR FUSION  12/14/2021   Dr Malachy Chamber   SPINAL  FUSION  2002   TONSILLECTOMY AND ADENOIDECTOMY  1961   TOTAL KNEE ARTHROPLASTY Right 01/01/2020   Procedure: Right total knee arthroplasty - [place] Neighbor to Assist;  Surgeon: Kennedy Bucker, MD;  Location: [facility] OR[facility];  Service: Orthopedics;  Laterality: Right;   TOTAL KNEE ARTHROPLASTY Left 10/12/2020   Procedure: TOTAL KNEE ARTHROPLASTY;  Surgeon: Kennedy Bucker, MD;  Location: [facility] OR[facility];  Service: Orthopedics;  Laterality: Left;   TUBAL LIGATION     UMBILICAL HERNIA REPAIR N/A 01/05/2017    Medical History: Past Medical History:  Diagnosis Date   Anemia    Arthritis    Asthma    Bacteremia due to Gram-negative bacteria 06/04/2019   CAD (coronary artery disease) 07/21/2016   a.) LHC 07/21/2016: mild nonobstructive CAD   Colon polyps    Complication of anesthesia    a.) anesthesia awareness   Compression fracture of L1 lumbar vertebra (HCC)    COPD (chronic obstructive pulmonary disease) (HCC)    Diastolic dysfunction    a.) TTE 03/02/2021: EF >55%, mild LVH, mild LAE, triv MR, mild TR/PR, G1DD   Duodenal ulcer    Dyspnea  GERD (gastroesophageal reflux disease)    History of 2019 novel coronavirus disease (COVID-19) 01/19/2021   a.) tested (+) at [facility]   History of lumbar fusion    a.) T12-S1   Hyperlipidemia    Hypertension    Insomnia    a.) takes melatonin PRN   OSA on CPAP    Osteopenia    Peripheral neuropathy    Plantar fasciitis    Pneumonia    Pulmonary hypertension (HCC)    a.) TTE 06/04/2019: moderate (PASP 40.3 mmHg)   Right inguinal hernia    a.) s/p repair 12/2016   RLS (restless legs syndrome)    a.) on ropinirole   SOB (shortness of breath)    T12 compression fracture (HCC)    Umbilical hernia    a.) s/p repair 12/2016   Urinary incontinence    Urinary tract infection    Wears hearing aid in both ears     Family History: Family History  Problem Relation Age of Onset   Alcohol abuse Mother    Cancer Mother    Stroke Mother    Alcohol abuse  Father    Arthritis Sister    Arthritis Daughter    COPD Daughter    Arthritis Paternal Grandmother    Asthma Paternal Grandmother    COPD Paternal Grandmother    Breast cancer Paternal Aunt     Social History: Social History   Socioeconomic History   Marital status: Widowed    Spouse name: Not on file   Number of children: 3   Years of education: Not on file   Highest education level: Associate degree: academic program  Occupational History   Not on file  Tobacco Use   Smoking status: Never   Smokeless tobacco: Never  Vaping Use   Vaping status: Never Used  Substance and Sexual Activity   Alcohol use: Not Currently   Drug use: Never   Sexual activity: Not Currently  Other Topics Concern   Not on file  Social History Narrative   Moved from [place] 2020, lives near daughter. Retired. Enjoys walking. Lives alone   Social Determinants of Health   Financial Resource Strain: Medium Risk (10/02/2022)   Overall Financial Resource Strain (CARDIA)    Difficulty of Paying Living Expenses: Somewhat hard  Food Insecurity: No Food Insecurity (10/02/2022)   Hunger Vital Sign    Worried About Running Out of Food in the Last Year: Never true    Ran Out of Food in the Last Year: Never true  Transportation Needs: No Transportation Needs (10/02/2022)   PRAPARE - Administrator, Civil Service (Medical): No    Lack of Transportation (Non-Medical): No  Physical Activity: Insufficiently Active (10/02/2022)   Exercise Vital Sign    Days of Exercise per Week: 5 days    Minutes of Exercise per Session: 10 min  Stress: No Stress Concern Present (10/02/2022)   [employer] of Occupational Health - Occupational Stress Questionnaire    Feeling of Stress : Not at all  Social Connections: Moderately Integrated (10/02/2022)   Social Connection and Isolation Panel [NHANES]    Frequency of Communication with Friends and Family: More than three times a week    Frequency of Social Gatherings  with Friends and Family: More than three times a week    Attends Religious Services: More than 4 times per year    Active Member of [employer] or Organizations: Yes    Attends Banker Meetings: More than 4  times per year    Marital Status: Widowed  Intimate Partner Violence: Not At Risk (10/02/2022)   Humiliation, Afraid, Rape, and Kick questionnaire    Fear of Current or Ex-Partner: No    Emotionally Abused: No    Physically Abused: No    Sexually Abused: No    Vital Signs: Blood pressure 139/68, pulse 67, temperature 97.8 F (36.6 C), resp. rate 16, height 4\' 11"  (1.499 m), weight 167 lb (75.8 kg), SpO2 96%.  Examination: General Appearance: The patient is well-developed, well-nourished, and in no distress. Skin: Gross inspection of skin unremarkable. Head: normocephalic, no gross deformities. Eyes: no gross deformities noted. ENT: ears appear grossly normal no exudates. Neck: Supple. No thyromegaly. No LAD. Respiratory: Lungs clear to auscultation. Cardiovascular: Normal S1 and S2 without murmur or rub. Extremities: No cyanosis. pulses are equal. Neurologic: Alert and oriented. No involuntary movements.  LABS: Recent Results (from the past 2160 hour(s))  Comprehensive metabolic panel     Status: Abnormal   Collection Time: 07/26/22 10:10 AM  Result Value Ref Range   Sodium 137 135 - 145 mmol/L   Potassium 3.8 3.5 - 5.1 mmol/L   Chloride 101 98 - 111 mmol/L   CO2 28 22 - 32 mmol/L   Glucose, Bld 118 (H) 70 - 99 mg/dL    Comment: Glucose reference range applies only to samples taken after fasting for at least 8 hours.   BUN 19 8 - 23 mg/dL   Creatinine, Ser 1.61 0.44 - 1.00 mg/dL   Calcium 9.6 8.9 - 09.6 mg/dL   Total Protein 7.5 6.5 - 8.1 g/dL   Albumin 4.9 3.5 - 5.0 g/dL   AST 22 15 - 41 U/L   ALT 25 0 - 44 U/L   Alkaline Phosphatase 107 38 - 126 U/L   Total Bilirubin 0.5 0.3 - 1.2 mg/dL   GFR, Estimated >04 >54 mL/min    Comment: (NOTE) Calculated using  the CKD-EPI Creatinine Equation (2021)    Anion gap 8 5 - 15    Comment: Performed at [facility], [address] [place], [place] [address]  CBC with Differential     Status: Abnormal   Collection Time: 07/26/22 10:10 AM  Result Value Ref Range   WBC 6.3 4.0 - 10.5 K/uL   RBC 5.27 (H) 3.87 - 5.11 MIL/uL   Hemoglobin 12.8 12.0 - 15.0 g/dL   HCT 91.4 78.2 - 95.6 %   MCV 79.1 (L) 80.0 - 100.0 fL   MCH 24.3 (L) 26.0 - 34.0 pg   MCHC 30.7 30.0 - 36.0 g/dL   RDW 21.3 (H) 08.6 - 57.8 %   Platelets 334 150 - 400 K/uL   nRBC 0.0 0.0 - 0.2 %   Neutrophils Relative % 67 %   Neutro Abs 4.2 1.7 - 7.7 K/uL   Lymphocytes Relative 22 %   Lymphs Abs 1.4 0.7 - 4.0 K/uL   Monocytes Relative 8 %   Monocytes Absolute 0.5 0.1 - 1.0 K/uL   Eosinophils Relative 1 %   Eosinophils Absolute 0.1 0.0 - 0.5 K/uL   Basophils Relative 1 %   Basophils Absolute 0.0 0.0 - 0.1 K/uL   WBC Morphology MORPHOLOGY UNREMARKABLE    Smear Review Normal platelet morphology    Immature Granulocytes 1 %   Abs Immature Granulocytes 0.03 0.00 - 0.07 K/uL   Ovalocytes PRESENT     Comment: Performed at [facility], [address], [place], [place] [address]  Lipase,  blood     Status: None   Collection Time: 07/26/22 10:10 AM  Result Value Ref Range   Lipase 28 11 - 51 U/L    Comment: Performed at [facility], [address] [place], [place] [address]  Urinalysis, Routine w reflex microscopic -Urine, Clean Catch     Status: Abnormal   Collection Time: 07/26/22 10:10 AM  Result Value Ref Range   Color, Urine YELLOW (A) YELLOW   APPearance CLEAR (A) CLEAR   Specific Gravity, Urine 1.009 1.005 - 1.030   pH 7.0 5.0 - 8.0   Glucose, UA NEGATIVE NEGATIVE mg/dL   Hgb urine dipstick NEGATIVE NEGATIVE   Bilirubin Urine NEGATIVE NEGATIVE   Ketones, ur NEGATIVE NEGATIVE mg/dL   Protein, ur NEGATIVE NEGATIVE mg/dL   Nitrite NEGATIVE NEGATIVE   Leukocytes,Ua NEGATIVE NEGATIVE    Comment:  Performed at [facility], [address], [place], [place] [address]  Troponin I (High Sensitivity)     Status: None   Collection Time: 07/26/22 10:10 AM  Result Value Ref Range   Troponin I (High Sensitivity) 4 <18 ng/L    Comment: (NOTE) Elevated high sensitivity troponin I (hsTnI) values and significant  changes across serial measurements may suggest ACS but many other  chronic and acute conditions are known to elevate hsTnI results.  Refer to the "Links" section for chest pain algorithms and additional  guidance. Performed at [facility], [address] [place], [place] [address]   Troponin I (High Sensitivity)     Status: None   Collection Time: 07/26/22  2:26 PM  Result Value Ref Range   Troponin I (High Sensitivity) 4 <18 ng/L    Comment: (NOTE) Elevated high sensitivity troponin I (hsTnI) values and significant  changes across serial measurements may suggest ACS but many other  chronic and acute conditions are known to elevate hsTnI results.  Refer to the "Links" section for chest pain algorithms and additional  guidance. Performed at [facility], [address] [place], [place] [address]   CBC with Differential/Platelet     Status: Abnormal   Collection Time: 08/04/22  2:23 PM  Result Value Ref Range   WBC 11.1 (H) 3.8 - 10.8 Thousand/uL   RBC 5.18 (H) 3.80 - 5.10 Million/uL   Hemoglobin 12.8 11.7 - 15.5 g/dL   HCT 63.8 75.6 - 43.3 %   MCV 76.4 (L) 80.0 - 100.0 fL   MCH 24.7 (L) 27.0 - 33.0 pg   MCHC 32.3 32.0 - 36.0 g/dL   RDW 29.5 (H) 18.8 - 41.6 %   Platelets 375 140 - 400 Thousand/uL   MPV 11.3 7.5 - 12.5 fL   Neutro Abs 7,504 1,500 - 7,800 cells/uL   Lymphs Abs 2,398 850 - 3,900 cells/uL   Absolute Monocytes 977 (H) 200 - 950 cells/uL   Eosinophils Absolute 155 15 - 500 cells/uL   Basophils Absolute 67 0 - 200 cells/uL   Neutrophils Relative % 67.6 %   Total Lymphocyte 21.6 %   Monocytes Relative 8.8 %   Eosinophils  Relative 1.4 %   Basophils Relative 0.6 %   Smear Review      Comment: . Red cell morphology appears unremarkable . Review of peripheral smear confirms automated results.   Basic metabolic panel     Status: Abnormal   Collection Time: 08/04/22  2:23 PM  Result Value Ref Range   Glucose, Bld 133 (H) 65 - 99 mg/dL    Comment: .  Fasting reference interval . For someone without known diabetes, a glucose value >125 mg/dL indicates that they may have diabetes and this should be confirmed with a follow-up test. .    BUN 20 7 - 25 mg/dL   Creat 2.83 1.51 - 7.61 mg/dL   BUN/Creatinine Ratio SEE NOTE: 6 - 22 (calc)    Comment:    Not Reported: BUN and Creatinine are within    reference range. .    Sodium 137 135 - 146 mmol/L   Potassium 3.5 3.5 - 5.3 mmol/L   Chloride 96 (L) 98 - 110 mmol/L   CO2 31 20 - 32 mmol/L   Calcium 10.0 8.6 - 10.4 mg/dL  Hepatic function panel     Status: None   Collection Time: 08/04/22  2:23 PM  Result Value Ref Range   Total Protein 6.8 6.1 - 8.1 g/dL   Albumin 4.5 3.6 - 5.1 g/dL   Globulin 2.3 1.9 - 3.7 g/dL (calc)   AG Ratio 2.0 1.0 - 2.5 (calc)   Total Bilirubin 0.4 0.2 - 1.2 mg/dL   Bilirubin, Direct 0.1 0.0 - 0.2 mg/dL   Indirect Bilirubin 0.3 0.2 - 1.2 mg/dL (calc)   Alkaline phosphatase (APISO) 93 37 - 153 U/L   AST 15 10 - 35 U/L   ALT 24 6 - 29 U/L  Sedimentation Rate     Status: None   Collection Time: 08/04/22  2:23 PM  Result Value Ref Range   Sed Rate 6 0 - 30 mm/h  C-reactive protein     Status: None   Collection Time: 08/04/22  2:23 PM  Result Value Ref Range   CRP 3.3 <8.0 mg/L  Lipase     Status: None   Collection Time: 08/04/22  2:23 PM  Result Value Ref Range   Lipase 22 7 - 60 U/L  Hemoglobin A1c     Status: Abnormal   Collection Time: 08/04/22  2:23 PM  Result Value Ref Range   Hgb A1c MFr Bld 5.9 (H) <5.7 % of total Hgb    Comment: For someone without known diabetes, a hemoglobin  A1c value between  5.7% and 6.4% is consistent with prediabetes and should be confirmed with a  follow-up test. . For someone with known diabetes, a value <7% indicates that their diabetes is well controlled. A1c targets should be individualized based on duration of diabetes, age, comorbid conditions, and other considerations. . This assay result is consistent with an increased risk of diabetes. . Currently, no consensus exists regarding use of hemoglobin A1c for diagnosis of diabetes for children. .    Mean Plasma Glucose 123 mg/dL   eAG (mmol/L) 6.8 mmol/L    Comment: . This test was performed on the Roche cobas c503 platform. Effective 12/05/21, a change in test platforms from the Abbott Architect to the Roche cobas c503 may have shifted HbA1c results compared to historical results. Based on laboratory validation testing conducted at [facility], the Roche platform relative to the Abbott platform had an average increase in HbA1c value of < or = 0.3%. This difference is within accepted  variability established by the [facility]. Note that not all individuals will have had a shift in their results and direct comparisons between historical and current results for testing conducted on different platforms is not recommended.   Pulmonary Function Test     Status: None   Collection Time: 08/28/22  8:17 AM  Result Value Ref Range   FEV1  FVC     FEV1/FVC     TLC     DLCO    Iron and TIBC     Status: None   Collection Time: 10/04/22  8:50 AM  Result Value Ref Range   Iron 63 28 - 170 ug/dL   TIBC 161 096 - 045 ug/dL   Saturation Ratios 17 10.4 - 31.8 %   UIBC 300 ug/dL    Comment: Performed at [facility], [address] [place], [place] [address]  Ferritin     Status: None   Collection Time: 10/04/22  8:50 AM  Result Value Ref Range   Ferritin 44 11 - 307 ng/mL    Comment: Performed at [facility], [address]  [place], [place] [address]  CBC     Status: Abnormal   Collection Time: 10/04/22  8:50 AM  Result Value Ref Range   WBC 5.4 4.0 - 10.5 K/uL   RBC 5.36 (H) 3.87 - 5.11 MIL/uL   Hemoglobin 13.8 12.0 - 15.0 g/dL   HCT 82.9 56.2 - 13.0 %   MCV 79.1 (L) 80.0 - 100.0 fL   MCH 25.7 (L) 26.0 - 34.0 pg   MCHC 32.5 30.0 - 36.0 g/dL   RDW 86.5 (H) 78.4 - 69.6 %   Platelets 298 150 - 400 K/uL   nRBC 0.0 0.0 - 0.2 %    Comment: Performed at [facility], [address] [place], [place] [address]    Radiology: DG Lumbar Spine 2-3 Views  Result Date: 08/18/2022 CLINICAL DATA:  Pain, recent fall EXAM: LUMBAR SPINE - 2-3 VIEW COMPARISON:  07/06/2022 FINDINGS: Spinal rods and fixating screws from T10 to the sacrum. Stable appearance of hardware. Chronic compression deformities at T12 and L1. Anterior fusion hardware at L2-L3 and L3-L4. Vertebra demonstrate normal stature IMPRESSION: Postsurgical changes. Chronic compression deformities at T12 and L1. Electronically Signed   By: Jasmine Pang M.D.   On: 08/18/2022 20:57   DG Thoracic Spine 2 View  Result Date: 08/18/2022 CLINICAL DATA:  Fall recent fusion EXAM: THORACIC SPINE 2 VIEWS COMPARISON:  07/06/2022 thoracic CT 03/28/2022 FINDINGS: Mild levoscoliosis of the upper thoracic spine.posterior spinal rods and fixating screws from T10. Similar compression deformity of T12. Chronic superior endplate compression at L1. Remaining vertebra demonstrate normal stature IMPRESSION: Posterior fusion from T10 with similar compression deformity of T12. Electronically Signed   By: Jasmine Pang M.D.   On: 08/18/2022 20:52    No results found.  No results found.    Assessment and Plan: Patient Active Problem List   Diagnosis Date Noted   Nausea 08/02/2022   Headache 08/02/2022   Blister, face 08/02/2022   Epigastric pain 08/02/2022   Eye swelling, bilateral 07/28/2022   History of duodenal ulcer 06/15/2022   Pseudoarthrosis of thoracic spine after  fusion procedure 04/17/2022   Other secondary kyphosis, thoracic region 04/17/2022   Compression fracture of T12 vertebra (HCC) 04/17/2022   Spinal instability, thoracic 04/17/2022   Abdominal wall bulge 01/31/2022   Iron deficiency anemia 01/31/2022   Constipation due to pain medication 12/28/2021   Post-operative pain 12/28/2021   Back pain at L4-L5 level 12/01/2021   Inguinal hernia of right side without obstruction or gangrene 11/15/2021   Umbilical hernia without obstruction or gangrene 11/15/2021   Coronary artery disease involving native coronary artery of native heart 10/07/2021   Other secondary scoliosis, lumbar region 10/04/2021   Postlaminectomy syndrome of lumbar region 10/04/2021   Pedal edema  07/26/2021   Mild pulmonary hypertension (HCC) 07/26/2021   Hyperlipidemia 11/17/2020   S/P TKR (total knee replacement) using cement, left 10/12/2020   Somnolence, daytime 09/09/2020   Rash 09/09/2020   Medicare annual wellness visit, subsequent 08/25/2020   Encounter for screening mammogram for breast cancer 08/25/2020   Encounter for general adult medical examination with abnormal findings 08/25/2020   GERD (gastroesophageal reflux disease) 08/25/2020   Dizzy spells 08/25/2020   Migraine 03/12/2020   IBS (irritable bowel syndrome) 03/12/2020   S/P TKR (total knee replacement) using cement, right 01/01/2020   Colonic polyp 10/22/2019   History of vaginal hysterectomy 06/11/2019   Estrogen deficiency 04/22/2019   History of skin cancer 04/22/2019   High-tone pelvic floor dysfunction 04/03/2019   IC (interstitial cystitis) 04/03/2019   Neuralgia of both pudendal nerves 04/03/2019   Pelvic pain in female 04/03/2019   Osteoporosis 02/12/2019   Asthma 01/31/2019   Adverse reaction to influenza vaccine, initial encounter 12/03/2018   Vitamin D deficiency 12/03/2018   Restless legs syndrome 11/28/2018   Small fiber neuropathy 11/28/2018   Allergic rhinitis 10/08/2018   Benign  essential hypertension 10/08/2018   Chronic bladder pain 10/08/2018   Neuropathy 10/08/2018   Osteoarthritis 10/08/2018   S/P BSO (bilateral salpingo-oophorectomy) 01/07/2018   OSA on CPAP 12/29/2016   Chronic obstructive pulmonary disease (HCC) 12/29/2016   Chronic dyspnea 06/24/2012   Other and unspecified disc disorder of lumbar region 10/25/2000   Diverticulosis of colon 10/25/2000   Prediabetes 09/24/1998   Tinnitus of both ears 09/24/1983    1. OSA on CPAP Restart nightly use  2. CPAP use counseling CPAP couseling-Discussed importance of adequate CPAP use as well as proper care and cleaning techniques of machine and all supplies.    General Counseling: I have discussed the findings of the evaluation and examination with Erin Petersen.  I have also discussed any further diagnostic evaluation thatmay be needed or ordered today. Erin Petersen verbalizes understanding of the findings of todays visit. We also reviewed her medications today and discussed drug interactions and side effects including but not limited excessive drowsiness and altered mental states. We also discussed that there is always a risk not just to her but also people around her. she has been encouraged to call the office with any questions or concerns that should arise related to todays visit.  No orders of the defined types were placed in this encounter.    Time spent: 30  I have personally obtained a history, examined the patient, evaluated laboratory and imaging results, formulated the assessment and plan and placed orders. This patient was seen by Lynn Ito, PA-C in collaboration with Dr. Freda Munro as a part of collaborative care agreement.     Yevonne Pax, MD [facility] Pulmonary and Critical Care Sleep medicine

## 2022-10-06 ENCOUNTER — Encounter: Payer: Self-pay | Admitting: Oncology

## 2022-10-06 ENCOUNTER — Inpatient Hospital Stay: Payer: Medicare Other | Admitting: Oncology

## 2022-10-06 DIAGNOSIS — D509 Iron deficiency anemia, unspecified: Secondary | ICD-10-CM | POA: Diagnosis not present

## 2022-10-07 ENCOUNTER — Encounter: Payer: Self-pay | Admitting: Oncology

## 2022-10-07 NOTE — Progress Notes (Signed)
I connected with Erin Petersen on 10/07/22 at  3:15 PM EDT by video enabled telemedicine visit and verified that I am speaking with the correct person using two identifiers.   I discussed the limitations, risks, security and privacy concerns of performing an evaluation and management service by telemedicine and the availability of in-person appointments. I also discussed with the patient that there may be a patient responsible charge related to this service. The patient expressed understanding and agreed to proceed.  Other persons participating in the visit and their role in the encounter:  none  Patient's location:  home Provider's location:  work  Stage manager Complaint:  iron deficiency anemia follow up  History of present illness: Patient is a 82 year old female with a past medical history significant for coronary artery disease, hypertension hyperlipidemia, COPD obstructive sleep apnea among other medical problems.  She has been referred for iron deficiency anemia.  Most recent CBC from 06/08/2022 showed white cell count of 8.2, H&H of 9.6/30.7 with an MCV of 73 and a platelet count of 415.  Iron studies showed ferritin of 16 with an iron saturation of 5% and low serum iron as well.  B12 and folate levels were normal.  Her baseline hemoglobin runs around 12 which was the case up on August 2022 and subsequently it started drifting down further.  It went down to 6.4 in February 2024.  She was seen by Dr. Allegra Lai for her iron deficiency upper endoscopy from 06/15/2022 showed normal duodenal bulb.  Erythematous mucosa noted in the antrum which was biopsied and is currently pending.  Normal-appearing gastric fundus body as well as GE junction and esophagus.  Her last colonoscopy was in 2020 which showed polyps in the descending and ascending colon and repeat colonoscopy was recommended in 5 years.  She has been referred for consideration of IV iron.  She has a prior history of duodenal ulcer.Patient had EGD with  Dr. Allegra Lai in April 2024 which showed erythematous mucosa in the antrum which was biopsied and was negative for malignancy.  No active bleeding noted   Patient had revision back surgery in feb 2024    Interval history patient did report improvement in her fatigue after receiving IV iron which lasted for some time but she is back to feeling some fatigue again.  Denies any blood loss in her stool or urine.   Review of Systems  Constitutional:  Positive for malaise/fatigue. Negative for chills, fever and weight loss.  HENT:  Negative for congestion, ear discharge and nosebleeds.   Eyes:  Negative for blurred vision.  Respiratory:  Negative for cough, hemoptysis, sputum production, shortness of breath and wheezing.   Cardiovascular:  Negative for chest pain, palpitations, orthopnea and claudication.  Gastrointestinal:  Negative for abdominal pain, blood in stool, constipation, diarrhea, heartburn, melena, nausea and vomiting.  Genitourinary:  Negative for dysuria, flank pain, frequency, hematuria and urgency.  Musculoskeletal:  Negative for back pain, joint pain and myalgias.  Skin:  Negative for rash.  Neurological:  Negative for dizziness, tingling, focal weakness, seizures, weakness and headaches.  Endo/Heme/Allergies:  Does not bruise/bleed easily.  Psychiatric/Behavioral:  Negative for depression and suicidal ideas. The patient does not have insomnia.     Allergies  Allergen Reactions   Bee Venom Anaphylaxis and Hives   Fosamax [Alendronate] Nausea Only    Stomach pain   Fusion Plus [Iron-Fa-B Cmp-C-Biot-Probiotic] Nausea Only    Stomach pain   Ciprofloxacin Hcl Rash    Mild rash after long term use  Sulfa Antibiotics Rash    Bactrim    Past Medical History:  Diagnosis Date   Anemia    Arthritis    Asthma    Bacteremia due to Gram-negative bacteria 06/04/2019   CAD (coronary artery disease) 07/21/2016   a.) LHC 07/21/2016: mild nonobstructive CAD   Colon polyps     Complication of anesthesia    a.) anesthesia awareness   Compression fracture of L1 lumbar vertebra (HCC)    COPD (chronic obstructive pulmonary disease) (HCC)    Diastolic dysfunction    a.) TTE 03/02/2021: EF >55%, mild LVH, mild LAE, triv MR, mild TR/PR, G1DD   Duodenal ulcer    Dyspnea    GERD (gastroesophageal reflux disease)    History of 2019 novel coronavirus disease (COVID-19) 01/19/2021   a.) tested (+) at [facility]   History of lumbar fusion    a.) T12-S1   Hyperlipidemia    Hypertension    Insomnia    a.) takes melatonin PRN   OSA on CPAP    Osteopenia    Peripheral neuropathy    Plantar fasciitis    Pneumonia    Pulmonary hypertension (HCC)    a.) TTE 06/04/2019: moderate (PASP 40.3 mmHg)   Right inguinal hernia    a.) s/p repair 12/2016   RLS (restless legs syndrome)    a.) on ropinirole   SOB (shortness of breath)    T12 compression fracture (HCC)    Umbilical hernia    a.) s/p repair 12/2016   Urinary incontinence    Urinary tract infection    Wears hearing aid in both ears     Past Surgical History:  Procedure Laterality Date   ABDOMINAL HYSTERECTOMY  1972   APPENDECTOMY  1958   APPLICATION OF INTRAOPERATIVE CT SCAN N/A 04/17/2022   Procedure: APPLICATION OF INTRAOPERATIVE CT SCAN;  Surgeon: Venetia Night, MD;  Location: [facility] OR[facility];  Service: Neurosurgery;  Laterality: N/A;   BILATERAL SALPINGOOPHORECTOMY  01/07/2018   BIOPSY  04/15/2019   Procedure: BIOPSY;  Surgeon: Toney Reil, MD;  Location: [facility];  Service: Endoscopy;;   CARDIAC CATHETERIZATION Left 07/21/2016   COLONOSCOPY WITH PROPOFOL N/A 11/11/2018   Procedure: COLONOSCOPY WITH PROPOFOL;  Surgeon: Toney Reil, MD;  Location: [facility];  Service: Gastroenterology;  Laterality: N/A;   CYSTO WITH HYDRODISTENSION N/A 12/30/2018   Procedure: CYSTOSCOPY/HYDRODISTENSION WITH MARCAINE;  Surgeon: Alfredo Martinez, MD;  Location: [facility];  Service: Urology;   Laterality: N/A;   ESOPHAGOGASTRODUODENOSCOPY (EGD) WITH PROPOFOL N/A 04/15/2019   Procedure: ESOPHAGOGASTRODUODENOSCOPY (EGD) WITH PROPOFOL;  Surgeon: Toney Reil, MD;  Location: [facility];  Service: Endoscopy;  Laterality: N/A;  sleep apnea   ESOPHAGOGASTRODUODENOSCOPY (EGD) WITH PROPOFOL N/A 07/09/2019   Procedure: ESOPHAGOGASTRODUODENOSCOPY (EGD) WITH PROPOFOL;  Surgeon: Toney Reil, MD;  Location: [facility] END[facility]COPY;  Service: Gastroenterology;  Laterality: N/A;   ESOPHAGOGASTRODUODENOSCOPY (EGD) WITH PROPOFOL N/A 06/15/2022   Procedure: ESOPHAGOGASTRODUODENOSCOPY (EGD) WITH PROPOFOL;  Surgeon: Toney Reil, MD;  Location: [facility];  Service: Endoscopy;  Laterality: N/A;  sleep apnea   EYE SURGERY Bilateral    INGUINAL HERNIA REPAIR Bilateral 01/05/2017   JOINT REPLACEMENT     LAPAROSCOPIC BILATERAL SALPINGO OOPHERECTOMY  2019   LUMBAR FUSION  2002   LUMBAR FUSION  11/14/2021   Dr Sharolyn Douglas   LUMBAR FUSION  12/14/2021   Dr Malachy Chamber   SPINAL FUSION  2002   TONSILLECTOMY AND ADENOIDECTOMY  1961   TOTAL KNEE ARTHROPLASTY Right 01/01/2020  Procedure: Right total knee arthroplasty - [place] Neighbor to Assist;  Surgeon: Kennedy Bucker, MD;  Location: [facility];  Service: Orthopedics;  Laterality: Right;   TOTAL KNEE ARTHROPLASTY Left 10/12/2020   Procedure: TOTAL KNEE ARTHROPLASTY;  Surgeon: Kennedy Bucker, MD;  Location: [facility];  Service: Orthopedics;  Laterality: Left;   TUBAL LIGATION     UMBILICAL HERNIA REPAIR N/A 01/05/2017    Social History   Socioeconomic History   Marital status: Widowed    Spouse name: Not on file   Number of children: 3   Years of education: Not on file   Highest education level: Associate degree: academic program  Occupational History   Not on file  Tobacco Use   Smoking status: Never   Smokeless tobacco: Never  Vaping Use   Vaping status: Never Used  Substance and Sexual Activity   Alcohol use: Not  Currently   Drug use: Never   Sexual activity: Not Currently  Other Topics Concern   Not on file  Social History Narrative   Moved from [place] 2020, lives near daughter. Retired. Enjoys walking. Lives alone   Social Determinants of Health   Financial Resource Strain: Medium Risk (10/02/2022)   Overall Financial Resource Strain (CARDIA)    Difficulty of Paying Living Expenses: Somewhat hard  Food Insecurity: No Food Insecurity (10/02/2022)   Hunger Vital Sign    Worried About Running Out of Food in the Last Year: Never true    Ran Out of Food in the Last Year: Never true  Transportation Needs: No Transportation Needs (10/02/2022)   PRAPARE - Administrator, Civil Service (Medical): No    Lack of Transportation (Non-Medical): No  Physical Activity: Insufficiently Active (10/02/2022)   Exercise Vital Sign    Days of Exercise per Week: 5 days    Minutes of Exercise per Session: 10 min  Stress: No Stress Concern Present (10/02/2022)   [employer] of Occupational Health - Occupational Stress Questionnaire    Feeling of Stress : Not at all  Social Connections: Moderately Integrated (10/02/2022)   Social Connection and Isolation Panel [NHANES]    Frequency of Communication with Friends and Family: More than three times a week    Frequency of Social Gatherings with Friends and Family: More than three times a week    Attends Religious Services: More than 4 times per year    Active Member of [employer] or Organizations: Yes    Attends Banker Meetings: More than 4 times per year    Marital Status: Widowed  Intimate Partner Violence: Not At Risk (10/02/2022)   Humiliation, Afraid, Rape, and Kick questionnaire    Fear of Current or Ex-Partner: No    Emotionally Abused: No    Physically Abused: No    Sexually Abused: No    Family History  Problem Relation Age of Onset   Alcohol abuse Mother    Cancer Mother    Stroke Mother    Alcohol abuse Father    Arthritis Sister     Arthritis Daughter    COPD Daughter    Arthritis Paternal Grandmother    Asthma Paternal Grandmother    COPD Paternal Grandmother    Breast cancer Paternal Aunt      Current Outpatient Medications:    albuterol (VENTOLIN HFA) 108 (90 Base) MCG/ACT inhaler, Inhale 2 puffs into the lungs every 6 (six) hours as needed for wheezing or shortness of breath., Disp: 2 each, Rfl: 2   amitriptyline (ELAVIL)  25 MG tablet, TAKE 1 TABLET AT BEDTIME, Disp: 90 tablet, Rfl: 1   amLODipine (NORVASC) 5 MG tablet, Take 1 tablet (5 mg total) by mouth daily., Disp: 1 tablet, Rfl: 0   aspirin 81 MG EC tablet, Take 81 mg by mouth at bedtime. , Disp: , Rfl:    bisacodyl (DULCOLAX) 5 MG EC tablet, Take 1-2 tablets (5-10 mg total) by mouth daily as needed for moderate constipation., Disp: 30 tablet, Rfl: 0   CALCIUM-VITAMIN D-VITAMIN K PO, Take 750 mg by mouth daily., Disp: , Rfl:    Cholecalciferol (VITAMIN D3) 50 MCG (2000 UT) TABS, Take 2,000 Units by mouth daily., Disp: , Rfl:    Cyanocobalamin (VITAMIN B-12) 1000 MCG SUBL, Take 2,000 mcg by mouth daily., Disp: , Rfl:    EPINEPHrine 0.3 mg/0.3 mL IJ SOAJ injection, Inject 0.3 mg into the muscle as needed for anaphylaxis., Disp: , Rfl:    fluticasone (FLONASE) 50 MCG/ACT nasal spray, Place 2 sprays into both nostrils daily. qhs, Disp: , Rfl:    hydrochlorothiazide (HYDRODIURIL) 25 MG tablet, TAKE 1 TABLET DAILY, Disp: 90 tablet, Rfl: 1   Melatonin 10 MG CAPS, Take 1 capsule by mouth as needed., Disp: , Rfl:    methocarbamol (ROBAXIN) 500 MG tablet, Take 1 tablet (500 mg total) by mouth every 6 (six) hours as needed for muscle spasms., Disp: 120 tablet, Rfl: 0   omeprazole (PRILOSEC) 40 MG capsule, Take 1 capsule (40 mg total) by mouth 2 (two) times daily before a meal. (Patient taking differently: Take 40 mg by mouth daily.), Disp: 180 capsule, Rfl: 0   ondansetron (ZOFRAN-ODT) 4 MG disintegrating tablet, Take 1 tablet (4 mg total) by mouth every 8 (eight) hours as  needed for nausea or vomiting., Disp: 270 tablet, Rfl: 0   polyethylene glycol (MIRALAX / GLYCOLAX) 17 g packet, Take 1 packet by mouth daily. , Disp: , Rfl:    pregabalin (LYRICA) 150 MG capsule, Take one by mouth in am and 2 at bedtime, Disp: 90 capsule, Rfl: 0   rOPINIRole (REQUIP) 1 MG tablet, Take 1 mg by mouth in the morning and at bedtime. 1 in am and 1 at 3pm, Disp: , Rfl:    rOPINIRole (REQUIP) 4 MG tablet, TAKE 1 TABLET AT BEDTIME, Disp: 90 tablet, Rfl: 3   simvastatin (ZOCOR) 20 MG tablet, TAKE 1 TABLET AT BEDTIME, Disp: 90 tablet, Rfl: 3   sucralfate (CARAFATE) 1 g tablet, Take 1 tablet (1 g total) by mouth 4 (four) times daily -  with meals and at bedtime., Disp: 30 tablet, Rfl: 1   tiZANidine (ZANAFLEX) 4 MG tablet, Take 4 mg by mouth 3 (three) times daily., Disp: , Rfl:    traMADol (ULTRAM) 50 MG tablet, Take 1 tablet (50 mg total) by mouth every 12 (twelve) hours as needed for severe pain., Disp: 15 tablet, Rfl: 0  No results found.  No images are attached to the encounter.      Latest Ref Rng & Units 08/04/2022    2:23 PM  CMP  Glucose 65 - 99 mg/dL 893   BUN 7 - 25 mg/dL 20   Creatinine 8.10 - 0.95 mg/dL 1.75   Sodium 102 - 585 mmol/L 137   Potassium 3.5 - 5.3 mmol/L 3.5   Chloride 98 - 110 mmol/L 96   CO2 20 - 32 mmol/L 31   Calcium 8.6 - 10.4 mg/dL 27.7   Total Protein 6.1 - 8.1 g/dL 6.8   Total Bilirubin 0.2 -  1.2 mg/dL 0.4   AST 10 - 35 U/L 15   ALT 6 - 29 U/L 24       Latest Ref Rng & Units 10/04/2022    8:50 AM  CBC  WBC 4.0 - 10.5 K/uL 5.4   Hemoglobin 12.0 - 15.0 g/dL 40.9   Hematocrit 81.1 - 46.0 % 42.4   Platelets 150 - 400 K/uL 298      Observation/objective: Appears in no acute distress over video visit today.  Breathing is nonlabored  Assessment and plan: Patient is a 82 year old female and this is a routine follow-up visit for iron deficiency anemia   patient's hemoglobin improved from 9.6 in April 2024 to presently to 13.8.  She is not  presently anemic however her ferritin levels are borderline at 44 with an iron saturation of 17%.  There may be a mild element of iron deficiency especially given that she has still some degree of microcytosis with an MCV of 79.  We will proceed with 3 doses of Venofer at this time but I have explained to the patient that it is possible that her fatigue may not improve by correcting her iron given that she is not significantly anemic.  Patient understands and would like to proceed withIV iron  Follow-up instructions: CBC ferritin iron studies in 3 and 6 months and I will see her back in 6 months.  I will also check B12 and folate in 6 months  I discussed the assessment and treatment plan with the patient. The patient was provided an opportunity to ask questions and all were answered. The patient agreed with the plan and demonstrated an understanding of the instructions.   The patient was advised to call back or seek an in-person evaluation if the symptoms worsen or if the condition fails to improve as anticipated.  I provided 12 minutes of face-to-face video visit time during this encounter, and > 50% was spent counseling as documented under my assessment & plan.  Visit Diagnosis: 1. Iron deficiency anemia, unspecified iron deficiency anemia type     Dr. Owens Shark, MD, MPH [facility] Tel- [phone] 10/07/2022 9:59 AM

## 2022-10-11 ENCOUNTER — Inpatient Hospital Stay: Payer: Medicare Other

## 2022-10-12 ENCOUNTER — Ambulatory Visit
Admission: RE | Admit: 2022-10-12 | Discharge: 2022-10-12 | Disposition: A | Discharge status: Home / Self Care | Payer: Medicare Other | Source: Ambulatory Visit | Attending: Neurosurgery | Admitting: Neurosurgery

## 2022-10-12 ENCOUNTER — Ambulatory Visit
Admission: RE | Admit: 2022-10-12 | Discharge: 2022-10-12 | Disposition: A | Discharge status: Home / Self Care | Payer: Medicare Other | Attending: Neurosurgery | Admitting: Neurosurgery

## 2022-10-12 ENCOUNTER — Ambulatory Visit: Payer: Medicare Other | Admitting: Neurosurgery

## 2022-10-12 ENCOUNTER — Encounter: Payer: Self-pay | Admitting: Neurosurgery

## 2022-10-12 VITALS — BP 130/74 | Ht <= 58 in | Wt 167.0 lb

## 2022-10-12 DIAGNOSIS — Z09 Encounter for follow-up examination after completed treatment for conditions other than malignant neoplasm: Secondary | ICD-10-CM

## 2022-10-12 DIAGNOSIS — M960 Pseudarthrosis after fusion or arthrodesis: Secondary | ICD-10-CM | POA: Diagnosis not present

## 2022-10-12 DIAGNOSIS — M4325 Fusion of spine, thoracolumbar region: Secondary | ICD-10-CM | POA: Diagnosis not present

## 2022-10-12 DIAGNOSIS — M546 Pain in thoracic spine: Secondary | ICD-10-CM | POA: Diagnosis not present

## 2022-10-12 DIAGNOSIS — M40204 Unspecified kyphosis, thoracic region: Secondary | ICD-10-CM | POA: Diagnosis not present

## 2022-10-12 DIAGNOSIS — M545 Low back pain, unspecified: Secondary | ICD-10-CM | POA: Diagnosis not present

## 2022-10-12 DIAGNOSIS — M4855XD Collapsed vertebra, not elsewhere classified, thoracolumbar region, subsequent encounter for fracture with routine healing: Secondary | ICD-10-CM | POA: Diagnosis not present

## 2022-10-12 NOTE — Progress Notes (Signed)
   REFERRING PHYSICIAN:  Judy Pimple, Md [address] [place],  [place] [address]  DOS: T10- pelvis fusion. T12-L1 posterior column osteotomy 04/17/22  HISTORY OF PRESENT ILLNESS: Erin Petersen is status post thoracopelvic fusion.  She is just doing okay.  She is having some back discomfort particular when she walks and and later in the day.  She feels that she cannot stand up straight without her brace on.  She does not have any pain or numbness down her legs that is different, but she is frustrated about her lack of improvement and back pain.   PHYSICAL EXAMINATION:  General: Patient is well developed, well nourished, calm, collected, and in no apparent distress.   NEUROLOGICAL:  General: In no acute distress.   Awake, alert, oriented to person, place, and time.  Pupils equal round and reactive to light.  Facial tone is symmetric.  Tongue protrusion is midline.  There is no pronator drift.   Strength:            Side Iliopsoas Quads Hamstring PF DF EHL  R 5 5 5 5 5 5   L 5 5 5 5 5 5    Incision c/d/I and healing well   ROS (Neurologic):  Negative except as noted above  IMAGING: No complications noted  ASSESSMENT/PLAN:  Erin Petersen is doing fair after spinal fusion.    She is starting physical therapy next week.  I am hopeful that restrengthen her core and back muscles may help.  I have approved her to proceed with acupuncture or massage therapy if she would like.  If she does not see improvements, we will have to consider CT scan and possible MRI scan to determine whether there are findings that are not apparent on her x-rays.  On her radiographs, her alignment is preserved and there is no evidence of pseudoarthrosis.  I will see her back in 2 months.  She does not need x-rays at that time.  Venetia Night MD Department of neurosurgery

## 2022-10-16 ENCOUNTER — Other Ambulatory Visit: Payer: Self-pay | Admitting: Family Medicine

## 2022-10-16 DIAGNOSIS — R7303 Prediabetes: Secondary | ICD-10-CM

## 2022-10-16 DIAGNOSIS — E7800 Pure hypercholesterolemia, unspecified: Secondary | ICD-10-CM

## 2022-10-16 DIAGNOSIS — H524 Presbyopia: Secondary | ICD-10-CM | POA: Diagnosis not present

## 2022-10-17 DIAGNOSIS — L538 Other specified erythematous conditions: Secondary | ICD-10-CM | POA: Diagnosis not present

## 2022-10-17 DIAGNOSIS — X32XXXA Exposure to sunlight, initial encounter: Secondary | ICD-10-CM | POA: Diagnosis not present

## 2022-10-17 DIAGNOSIS — D2262 Melanocytic nevi of left upper limb, including shoulder: Secondary | ICD-10-CM | POA: Diagnosis not present

## 2022-10-17 DIAGNOSIS — D225 Melanocytic nevi of trunk: Secondary | ICD-10-CM | POA: Diagnosis not present

## 2022-10-17 DIAGNOSIS — L570 Actinic keratosis: Secondary | ICD-10-CM | POA: Diagnosis not present

## 2022-10-17 DIAGNOSIS — L820 Inflamed seborrheic keratosis: Secondary | ICD-10-CM | POA: Diagnosis not present

## 2022-10-17 DIAGNOSIS — L578 Other skin changes due to chronic exposure to nonionizing radiation: Secondary | ICD-10-CM | POA: Diagnosis not present

## 2022-10-17 NOTE — Telephone Encounter (Signed)
Please schedule a HTN follow up with fasting labs prior (lab orders are in)

## 2022-10-17 NOTE — Telephone Encounter (Signed)
Last lipid lab was 08/10/20 and no future appts., please advise

## 2022-10-18 ENCOUNTER — Encounter: Payer: Self-pay | Admitting: Family Medicine

## 2022-10-18 ENCOUNTER — Inpatient Hospital Stay: Payer: Medicare Other

## 2022-10-18 ENCOUNTER — Other Ambulatory Visit: Payer: Self-pay | Admitting: Oncology

## 2022-10-18 VITALS — BP 129/60 | HR 62 | Temp 97.8°F | Resp 20

## 2022-10-18 DIAGNOSIS — D509 Iron deficiency anemia, unspecified: Secondary | ICD-10-CM | POA: Diagnosis not present

## 2022-10-18 DIAGNOSIS — D508 Other iron deficiency anemias: Secondary | ICD-10-CM

## 2022-10-18 MED ORDER — SODIUM CHLORIDE 0.9 % IV SOLN
Freq: Once | INTRAVENOUS | Status: AC
  Filled 2022-10-18: qty 250

## 2022-10-18 MED ORDER — ONDANSETRON HCL 4 MG PO TABS
4.0000 mg | ORAL_TABLET | Freq: Once | ORAL | Status: AC
  Administered 2022-10-18: 4 mg via ORAL
  Filled 2022-10-18: qty 1

## 2022-10-18 MED ORDER — ONDANSETRON HCL 4 MG PO TABS
4.0000 mg | ORAL_TABLET | ORAL | Status: DC
  Filled 2022-10-18: qty 1

## 2022-10-18 MED ORDER — SODIUM CHLORIDE 0.9 % IV SOLN
200.0000 mg | Freq: Once | INTRAVENOUS | Status: AC
  Administered 2022-10-18: 200 mg via INTRAVENOUS
  Filled 2022-10-18: qty 200

## 2022-10-18 NOTE — Patient Instructions (Signed)
 Iron Sucrose Injection What is this medication? IRON SUCROSE (EYE ern SOO krose) treats low levels of iron (iron deficiency anemia) in people with kidney disease. Iron is a mineral that plays an important role in making red blood cells, which carry oxygen from your lungs to the rest of your body. This medicine may be used for other purposes; ask your health care provider or pharmacist if you have questions. COMMON BRAND NAME(S): Venofer What should I tell my care team before I take this medication? They need to know if you have any of these conditions: Anemia not caused by low iron levels Heart disease High levels of iron in the blood Kidney disease Liver disease An unusual or allergic reaction to iron, other medications, foods, dyes, or preservatives Pregnant or trying to get pregnant Breastfeeding How should I use this medication? This medication is for infusion into a vein. It is given in a hospital or clinic setting. Talk to your care team about the use of this medication in children. While this medication may be prescribed for children as young as 2 years for selected conditions, precautions do apply. Overdosage: If you think you have taken too much of this medicine contact a poison control center or emergency room at once. NOTE: This medicine is only for you. Do not share this medicine with others. What if I miss a dose? Keep appointments for follow-up doses. It is important not to miss your dose. Call your care team if you are unable to keep an appointment. What may interact with this medication? Do not take this medication with any of the following: Deferoxamine Dimercaprol Other iron products This medication may also interact with the following: Chloramphenicol Deferasirox This list may not describe all possible interactions. Give your health care provider a list of all the medicines, herbs, non-prescription drugs, or dietary supplements you use. Also tell them if you smoke,  drink alcohol, or use illegal drugs. Some items may interact with your medicine. What should I watch for while using this medication? Visit your care team regularly. Tell your care team if your symptoms do not start to get better or if they get worse. You may need blood work done while you are taking this medication. You may need to follow a special diet. Talk to your care team. Foods that contain iron include: whole grains/cereals, dried fruits, beans, or peas, leafy green vegetables, and organ meats (liver, kidney). What side effects may I notice from receiving this medication? Side effects that you should report to your care team as soon as possible: Allergic reactions--skin rash, itching, hives, swelling of the face, lips, tongue, or throat Low blood pressure--dizziness, feeling faint or lightheaded, blurry vision Shortness of breath Side effects that usually do not require medical attention (report to your care team if they continue or are bothersome): Flushing Headache Joint pain Muscle pain Nausea Pain, redness, or irritation at injection site This list may not describe all possible side effects. Call your doctor for medical advice about side effects. You may report side effects to FDA at 1-800-FDA-1088. Where should I keep my medication? This medication is given in a hospital or clinic. It will not be stored at home. NOTE: This sheet is a summary. It may not cover all possible information. If you have questions about this medicine, talk to your doctor, pharmacist, or health care provider.  2024 Elsevier/Gold Standard (2022-07-21 00:00:00)

## 2022-10-18 NOTE — Telephone Encounter (Signed)
PATIENT SCHEDULED  °

## 2022-10-19 DIAGNOSIS — M960 Pseudarthrosis after fusion or arthrodesis: Secondary | ICD-10-CM | POA: Diagnosis not present

## 2022-10-19 DIAGNOSIS — M4325 Fusion of spine, thoracolumbar region: Secondary | ICD-10-CM | POA: Diagnosis not present

## 2022-10-20 ENCOUNTER — Other Ambulatory Visit: Payer: Medicare Other

## 2022-10-23 DIAGNOSIS — M960 Pseudarthrosis after fusion or arthrodesis: Secondary | ICD-10-CM | POA: Diagnosis not present

## 2022-10-23 DIAGNOSIS — M4325 Fusion of spine, thoracolumbar region: Secondary | ICD-10-CM | POA: Diagnosis not present

## 2022-10-24 ENCOUNTER — Encounter: Payer: Self-pay | Admitting: Family Medicine

## 2022-10-24 ENCOUNTER — Ambulatory Visit (INDEPENDENT_AMBULATORY_CARE_PROVIDER_SITE_OTHER): Payer: Medicare Other | Admitting: Family Medicine

## 2022-10-24 VITALS — BP 136/82 | HR 86 | Temp 97.6°F | Ht <= 58 in | Wt 167.5 lb

## 2022-10-24 DIAGNOSIS — G629 Polyneuropathy, unspecified: Secondary | ICD-10-CM | POA: Diagnosis not present

## 2022-10-24 DIAGNOSIS — J069 Acute upper respiratory infection, unspecified: Secondary | ICD-10-CM

## 2022-10-24 DIAGNOSIS — J020 Streptococcal pharyngitis: Secondary | ICD-10-CM | POA: Diagnosis not present

## 2022-10-24 DIAGNOSIS — E7800 Pure hypercholesterolemia, unspecified: Secondary | ICD-10-CM

## 2022-10-24 DIAGNOSIS — K581 Irritable bowel syndrome with constipation: Secondary | ICD-10-CM

## 2022-10-24 DIAGNOSIS — J209 Acute bronchitis, unspecified: Secondary | ICD-10-CM

## 2022-10-24 DIAGNOSIS — R7303 Prediabetes: Secondary | ICD-10-CM

## 2022-10-24 DIAGNOSIS — J449 Chronic obstructive pulmonary disease, unspecified: Secondary | ICD-10-CM

## 2022-10-24 DIAGNOSIS — I10 Essential (primary) hypertension: Secondary | ICD-10-CM

## 2022-10-24 DIAGNOSIS — J029 Acute pharyngitis, unspecified: Secondary | ICD-10-CM

## 2022-10-24 DIAGNOSIS — D500 Iron deficiency anemia secondary to blood loss (chronic): Secondary | ICD-10-CM

## 2022-10-24 DIAGNOSIS — I272 Pulmonary hypertension, unspecified: Secondary | ICD-10-CM

## 2022-10-24 LAB — COMPREHENSIVE METABOLIC PANEL
ALT: 28 U/L (ref 0–35)
AST: 22 U/L (ref 0–37)
Albumin: 4.3 g/dL (ref 3.5–5.2)
Alkaline Phosphatase: 100 U/L (ref 39–117)
BUN: 15 mg/dL (ref 6–23)
CO2: 32 mEq/L (ref 19–32)
Calcium: 9.4 mg/dL (ref 8.4–10.5)
Chloride: 96 mEq/L (ref 96–112)
Creatinine, Ser: 0.74 mg/dL (ref 0.40–1.20)
GFR: 76.06 mL/min (ref >60.00)
Glucose, Bld: 114 mg/dL — ABNORMAL HIGH (ref 70–99)
Potassium: 3.5 mEq/L (ref 3.5–5.1)
Sodium: 137 mEq/L (ref 135–145)
Total Bilirubin: 0.3 mg/dL (ref 0.2–1.2)
Total Protein: 7.1 g/dL (ref 6.0–8.3)

## 2022-10-24 LAB — LIPID PANEL
Cholesterol: 139 mg/dL (ref 0–200)
HDL: 36.5 mg/dL — ABNORMAL LOW (ref >39.00)
LDL Cholesterol: 81 mg/dL (ref 0–99)
NonHDL: 102.22
Total CHOL/HDL Ratio: 4
Triglycerides: 107 mg/dL (ref 0.0–149.0)
VLDL: 21.4 mg/dL (ref 0.0–40.0)

## 2022-10-24 LAB — POCT RAPID STREP A (OFFICE): Rapid Strep A Screen: POSITIVE — AB

## 2022-10-24 LAB — POC COVID19 BINAXNOW: SARS Coronavirus 2 Ag: NEGATIVE

## 2022-10-24 MED ORDER — PREDNISONE 20 MG PO TABS: 20.0000 mg | ORAL_TABLET | Freq: Every day | ORAL | 0 refills | Status: DC

## 2022-10-24 MED ORDER — AMOXICILLIN 500 MG PO CAPS: 500.0000 mg | ORAL_CAPSULE | Freq: Two times a day (BID) | ORAL | 0 refills | Status: AC

## 2022-10-24 MED ORDER — ALBUTEROL SULFATE HFA 108 (90 BASE) MCG/ACT IN AERS: 2.0000 | INHALATION_SPRAY | Freq: Four times a day (QID) | RESPIRATORY_TRACT | 5 refills | Status: AC | PRN

## 2022-10-24 MED ORDER — PREGABALIN 150 MG PO CAPS: 150.0000 mg | ORAL_CAPSULE | Freq: Two times a day (BID) | ORAL | 0 refills | Status: DC

## 2022-10-24 MED ORDER — PREGABALIN 150 MG PO CAPS: 150.0000 mg | ORAL_CAPSULE | Freq: Two times a day (BID) | ORAL | 1 refills | Status: DC

## 2022-10-24 MED ORDER — BENZONATATE 200 MG PO CAPS: 200.0000 mg | ORAL_CAPSULE | Freq: Three times a day (TID) | ORAL | 1 refills | Status: DC | PRN

## 2022-10-24 NOTE — Assessment & Plan Note (Signed)
Getting iron infusions Will cancel tomorrow's treatment due to strep throat

## 2022-10-24 NOTE — Assessment & Plan Note (Signed)
Chronic constipation is bothersome  Tried linzess years ago at highest dose and it helped but not affordable  This is generic now-encouraged pt to check on coverage and let her GI doctor know   In meantime keep up good fluids and fiber

## 2022-10-24 NOTE — Assessment & Plan Note (Signed)
Strep test positive Treatment with amoxicillin   Also has viral bronchitis- prednisone prescription as well   Handout given See AVS for symptom care recommended   Update if not starting to improve in a week or if worsening  Call back and Er precautions noted in detail today

## 2022-10-24 NOTE — Assessment & Plan Note (Signed)
With mild reactive airways Suspect viral (also has strep throat right now)  Prescription prednisone 20 mg daily 5 d- discussed side effects Over the counter DM prn/fluids /symptoms care See AVS  Update if not starting to improve in a week or if worsening  Call back and Er precautions noted in detail today

## 2022-10-24 NOTE — Assessment & Plan Note (Signed)
History of this Refilled albuterol  She has acute viral bronchitis currently

## 2022-10-24 NOTE — Progress Notes (Signed)
Subjective:    Patient ID: Erin Petersen, female    DOB: 01/23/1941, 82 y.o.   MRN: [id number]  HPI  Wt Readings from Last 3 Encounters:  10/24/22 167 lb 8 oz (76 kg)  10/12/22 167 lb (75.8 kg)  10/05/22 167 lb (75.8 kg)   36.25 kg/m  Vitals:   10/24/22 1050  BP: 136/82  Pulse: 86  Temp: 97.6 F (36.4 C)  SpO2: 96%     Pt presents for HTN and also cough/ congestion and ST Also mentions that her IBS constipation is worse  In past was treatment with linzess - higher dose (too $$)   Symptoms started Sunday night No know sick contacts  ST with some hoarseness  Cough - dry and hacky  Occational shortness of breath during cough fit  A little wheezing (history of asthma)  Tight chest     Tested her 02 last night - lowest 93%    Past stomach pain and nausea is gone  Gets a knawing feeling occationally   Results for orders placed or performed in visit on 10/24/22  POC COVID-19 BinaxNow  Result Value Ref Range   SARS Coronavirus 2 Ag Negative Negative  Rapid Strep A  Result Value Ref Range   Rapid Strep A Screen Positive (A) Negative      bp is stable today  No cp or palpitations or headaches or edema  No side effects to medicines  BP Readings from Last 3 Encounters:  10/24/22 136/82  10/18/22 129/60  10/12/22 130/74   Amlodipine 5 mg daily  Hydrochlorothiazide 25 mg daily   Lab Results  Component Value Date   NA 137 08/04/2022   K 3.5 08/04/2022   CO2 31 08/04/2022   GLUCOSE 133 (H) 08/04/2022   BUN 20 08/04/2022   CREATININE 0.88 08/04/2022   CALCIUM 10.0 08/04/2022   GFR 80.34 01/31/2022   GFRNONAA >60 07/26/2022     Getting iron infusions   Hyperlipidemia  Lab Results  Component Value Date   CHOL 163 08/10/2020   HDL 36.20 (L) 08/10/2020   LDLCALC 67 03/12/2020   LDLDIRECT 90.0 08/10/2020   TRIG 368.0 (H) 08/10/2020   CHOLHDL 5 08/10/2020   Due for labs   Needs refill lyrica to local and her mail order   Past history of  DM- resolved  Lab Results  Component Value Date   HGBA1C 5.9 (H) 08/04/2022     Patient Active Problem List   Diagnosis Date Noted   URI with cough and congestion 10/24/2022   Acute bronchitis 10/24/2022   Strep throat 10/24/2022   Headache 08/02/2022   Epigastric pain 08/02/2022   History of duodenal ulcer 06/15/2022   Pseudoarthrosis of thoracic spine after fusion procedure 04/17/2022   Other secondary kyphosis, thoracic region 04/17/2022   Compression fracture of T12 vertebra (HCC) 04/17/2022   Spinal instability, thoracic 04/17/2022   Abdominal wall bulge 01/31/2022   Iron deficiency anemia 01/31/2022   Constipation due to pain medication 12/28/2021   Post-operative pain 12/28/2021   Back pain at L4-L5 level 12/01/2021   Inguinal hernia of right side without obstruction or gangrene 11/15/2021   Umbilical hernia without obstruction or gangrene 11/15/2021   Coronary artery disease involving native coronary artery of native heart 10/07/2021   Other secondary scoliosis, lumbar region 10/04/2021   Postlaminectomy syndrome of lumbar region 10/04/2021   Pedal edema 07/26/2021   Mild pulmonary hypertension (HCC) 07/26/2021   Hyperlipidemia 11/17/2020   S/P TKR (total knee  replacement) using cement, left 10/12/2020   Somnolence, daytime 09/09/2020   Encounter for screening mammogram for breast cancer 08/25/2020   Encounter for general adult medical examination with abnormal findings 08/25/2020   GERD (gastroesophageal reflux disease) 08/25/2020   Dizzy spells 08/25/2020   Migraine 03/12/2020   IBS (irritable bowel syndrome) 03/12/2020   S/P TKR (total knee replacement) using cement, right 01/01/2020   Colonic polyp 10/22/2019   History of vaginal hysterectomy 06/11/2019   Estrogen deficiency 04/22/2019   History of skin cancer 04/22/2019   High-tone pelvic floor dysfunction 04/03/2019   IC (interstitial cystitis) 04/03/2019   Neuralgia of both pudendal nerves 04/03/2019    Pelvic pain in female 04/03/2019   Osteoporosis 02/12/2019   Asthma 01/31/2019   Adverse reaction to influenza vaccine, initial encounter 12/03/2018   Vitamin D deficiency 12/03/2018   Restless legs syndrome 11/28/2018   Allergic rhinitis 10/08/2018   Benign essential hypertension 10/08/2018   Chronic bladder pain 10/08/2018   Neuropathy 10/08/2018   Osteoarthritis 10/08/2018   S/P BSO (bilateral salpingo-oophorectomy) 01/07/2018   OSA on CPAP 12/29/2016   Chronic obstructive pulmonary disease (HCC) 12/29/2016   Chronic dyspnea 06/24/2012   Other and unspecified disc disorder of lumbar region 10/25/2000   Diverticulosis of colon 10/25/2000   Prediabetes 09/24/1998   Tinnitus of both ears 09/24/1983   Past Medical History:  Diagnosis Date   Anemia    Arthritis    Asthma    Bacteremia due to Gram-negative bacteria 06/04/2019   CAD (coronary artery disease) 07/21/2016   a.) LHC 07/21/2016: mild nonobstructive CAD   Colon polyps    Complication of anesthesia    a.) anesthesia awareness   Compression fracture of L1 lumbar vertebra (HCC)    COPD (chronic obstructive pulmonary disease) (HCC)    Diastolic dysfunction    a.) TTE 03/02/2021: EF >55%, mild LVH, mild LAE, triv MR, mild TR/PR, G1DD   Duodenal ulcer    Dyspnea    GERD (gastroesophageal reflux disease)    History of 2019 novel coronavirus disease (COVID-19) 01/19/2021   a.) tested (+) at [facility]   History of lumbar fusion    a.) T12-S1   Hyperlipidemia    Hypertension    Insomnia    a.) takes melatonin PRN   OSA on CPAP    Osteopenia    Peripheral neuropathy    Plantar fasciitis    Pneumonia    Pulmonary hypertension (HCC)    a.) TTE 06/04/2019: moderate (PASP 40.3 mmHg)   Right inguinal hernia    a.) s/p repair 12/2016   RLS (restless legs syndrome)    a.) on ropinirole   SOB (shortness of breath)    T12 compression fracture (HCC)    Umbilical hernia    a.) s/p repair 12/2016   Urinary incontinence     Urinary tract infection    Wears hearing aid in both ears    Past Surgical History:  Procedure Laterality Date   ABDOMINAL HYSTERECTOMY  1972   APPENDECTOMY  1958   APPLICATION OF INTRAOPERATIVE CT SCAN N/A 04/17/2022   Procedure: APPLICATION OF INTRAOPERATIVE CT SCAN;  Surgeon: Venetia Night, MD;  Location: [facility];  Service: Neurosurgery;  Laterality: N/A;   BILATERAL SALPINGOOPHORECTOMY  01/07/2018   BIOPSY  04/15/2019   Procedure: BIOPSY;  Surgeon: Toney Reil, MD;  Location: [facility];  Service: Endoscopy;;   CARDIAC CATHETERIZATION Left 07/21/2016   COLONOSCOPY WITH PROPOFOL N/A 11/11/2018   Procedure: COLONOSCOPY WITH PROPOFOL;  Surgeon:  Toney Reil, MD;  Location: [facility];  Service: Gastroenterology;  Laterality: N/A;   CYSTO WITH HYDRODISTENSION N/A 12/30/2018   Procedure: CYSTOSCOPY/HYDRODISTENSION WITH MARCAINE;  Surgeon: Alfredo Martinez, MD;  Location: [facility];  Service: Urology;  Laterality: N/A;   ESOPHAGOGASTRODUODENOSCOPY (EGD) WITH PROPOFOL N/A 04/15/2019   Procedure: ESOPHAGOGASTRODUODENOSCOPY (EGD) WITH PROPOFOL;  Surgeon: Toney Reil, MD;  Location: [facility];  Service: Endoscopy;  Laterality: N/A;  sleep apnea   ESOPHAGOGASTRODUODENOSCOPY (EGD) WITH PROPOFOL N/A 07/09/2019   Procedure: ESOPHAGOGASTRODUODENOSCOPY (EGD) WITH PROPOFOL;  Surgeon: Toney Reil, MD;  Location: [facility];  Service: Gastroenterology;  Laterality: N/A;   ESOPHAGOGASTRODUODENOSCOPY (EGD) WITH PROPOFOL N/A 06/15/2022   Procedure: ESOPHAGOGASTRODUODENOSCOPY (EGD) WITH PROPOFOL;  Surgeon: Toney Reil, MD;  Location: [facility];  Service: Endoscopy;  Laterality: N/A;  sleep apnea   EYE SURGERY Bilateral    INGUINAL HERNIA REPAIR Bilateral 01/05/2017   JOINT REPLACEMENT     LAPAROSCOPIC BILATERAL SALPINGO OOPHERECTOMY  2019   LUMBAR FUSION  2002   LUMBAR FUSION  11/14/2021   Dr Sharolyn Douglas   LUMBAR FUSION   12/14/2021   Dr Malachy Chamber   SPINAL FUSION  2002   TONSILLECTOMY AND ADENOIDECTOMY  1961   TOTAL KNEE ARTHROPLASTY Right 01/01/2020   Procedure: Right total knee arthroplasty - [place] Neighbor to Assist;  Surgeon: Kennedy Bucker, MD;  Location: [facility];  Service: Orthopedics;  Laterality: Right;   TOTAL KNEE ARTHROPLASTY Left 10/12/2020   Procedure: TOTAL KNEE ARTHROPLASTY;  Surgeon: Kennedy Bucker, MD;  Location: [facility];  Service: Orthopedics;  Laterality: Left;   TUBAL LIGATION     UMBILICAL HERNIA REPAIR N/A 01/05/2017   Social History   Tobacco Use   Smoking status: Never   Smokeless tobacco: Never  Vaping Use   Vaping status: Never Used  Substance Use Topics   Alcohol use: Not Currently   Drug use: Never   Family History  Problem Relation Age of Onset   Alcohol abuse Mother    Cancer Mother    Stroke Mother    Alcohol abuse Father    Arthritis Sister    Arthritis Daughter    COPD Daughter    Arthritis Paternal Grandmother    Asthma Paternal Grandmother    COPD Paternal Grandmother    Breast cancer Paternal Aunt    Allergies  Allergen Reactions   Bee Venom Anaphylaxis and Hives   Fosamax [Alendronate] Nausea Only    Stomach pain   Fusion Plus [Iron-Fa-B Cmp-C-Biot-Probiotic] Nausea Only    Stomach pain   Ciprofloxacin Hcl Rash    Mild rash after long term use   Sulfa Antibiotics Rash    Bactrim   Current Outpatient Medications on File Prior to Visit  Medication Sig Dispense Refill   albuterol (VENTOLIN HFA) 108 (90 Base) MCG/ACT inhaler Inhale 2 puffs into the lungs every 6 (six) hours as needed for wheezing or shortness of breath. 2 each 2   amitriptyline (ELAVIL) 25 MG tablet TAKE 1 TABLET AT BEDTIME 90 tablet 1   amLODipine (NORVASC) 5 MG tablet Take 1 tablet (5 mg total) by mouth daily. 1 tablet 0   aspirin 81 MG EC tablet Take 81 mg by mouth at bedtime.      bisacodyl (DULCOLAX) 5 MG EC tablet Take 1-2 tablets (5-10 mg total) by mouth daily as needed  for moderate constipation. 30 tablet 0   CALCIUM-VITAMIN D-VITAMIN K PO Take 750 mg by mouth daily.  Cholecalciferol (VITAMIN D3) 50 MCG (2000 UT) TABS Take 2,000 Units by mouth daily.     Cyanocobalamin (VITAMIN B-12) 1000 MCG SUBL Take 2,000 mcg by mouth daily.     EPINEPHrine 0.3 mg/0.3 mL IJ SOAJ injection Inject 0.3 mg into the muscle as needed for anaphylaxis.     fluticasone (FLONASE) 50 MCG/ACT nasal spray Place 2 sprays into both nostrils daily. qhs     hydrochlorothiazide (HYDRODIURIL) 25 MG tablet TAKE 1 TABLET DAILY 90 tablet 1   Melatonin 10 MG CAPS Take 1 capsule by mouth as needed.     methocarbamol (ROBAXIN) 500 MG tablet Take 1 tablet (500 mg total) by mouth every 6 (six) hours as needed for muscle spasms. 120 tablet 0   omeprazole (PRILOSEC) 40 MG capsule Take 1 capsule (40 mg total) by mouth 2 (two) times daily before a meal. (Patient taking differently: Take 40 mg by mouth daily.) 180 capsule 0   ondansetron (ZOFRAN-ODT) 4 MG disintegrating tablet Take 1 tablet (4 mg total) by mouth every 8 (eight) hours as needed for nausea or vomiting. 270 tablet 0   polyethylene glycol (MIRALAX / GLYCOLAX) 17 g packet Take 1 packet by mouth daily.      rOPINIRole (REQUIP) 1 MG tablet Take 1 mg by mouth in the morning and at bedtime. 1 in am and 1 at 3pm     rOPINIRole (REQUIP) 4 MG tablet TAKE 1 TABLET AT BEDTIME 90 tablet 3   simvastatin (ZOCOR) 20 MG tablet TAKE 1 TABLET AT BEDTIME 90 tablet 0   sucralfate (CARAFATE) 1 g tablet Take 1 tablet (1 g total) by mouth 4 (four) times daily -  with meals and at bedtime. 30 tablet 1   tiZANidine (ZANAFLEX) 4 MG tablet Take 4 mg by mouth 3 (three) times daily.     traMADol (ULTRAM) 50 MG tablet Take 1 tablet (50 mg total) by mouth every 12 (twelve) hours as needed for severe pain. 15 tablet 0   No current facility-administered medications on file prior to visit.    Review of Systems  Constitutional:  Negative for activity change, appetite  change, fatigue, fever and unexpected weight change.  HENT:  Positive for postnasal drip, sneezing and sore throat. Negative for congestion, ear pain, rhinorrhea and sinus pressure.   Eyes:  Negative for pain, discharge, redness and visual disturbance.  Respiratory:  Positive for cough, shortness of breath and wheezing. Negative for stridor.   Cardiovascular:  Negative for chest pain and palpitations.  Gastrointestinal:  Positive for constipation. Negative for abdominal pain, blood in stool, diarrhea, nausea and vomiting.  Endocrine: Negative for polydipsia and polyuria.  Genitourinary:  Negative for dysuria, frequency, hematuria and urgency.  Musculoskeletal:  Negative for arthralgias, back pain and myalgias.  Skin:  Negative for pallor and rash.  Allergic/Immunologic: Negative for environmental allergies.  Neurological:  Negative for dizziness, syncope, weakness, light-headedness and headaches.       Neuropathy   Hematological:  Negative for adenopathy. Does not bruise/bleed easily.  Psychiatric/Behavioral:  Negative for confusion, decreased concentration and dysphoric mood. The patient is not nervous/anxious.        Objective:   Physical Exam Constitutional:      General: She is not in acute distress.    Appearance: She is well-developed. She is obese. She is not ill-appearing or diaphoretic.  HENT:     Head: Normocephalic and atraumatic.     Right Ear: Tympanic membrane and ear canal normal.     Left  Ear: Tympanic membrane and ear canal normal.     Nose: Congestion and rhinorrhea present.     Mouth/Throat:     Mouth: Mucous membranes are moist.     Pharynx: Posterior oropharyngeal erythema present.     Comments: Posterior erythema  No lesions  No swelling Some mild clear pnd  Eyes:     General:        Right eye: No discharge.        Left eye: No discharge.     Conjunctiva/sclera: Conjunctivae normal.     Pupils: Pupils are equal, round, and reactive to light.  Neck:      Thyroid: No thyromegaly.     Vascular: No carotid bruit or JVD.  Cardiovascular:     Rate and Rhythm: Normal rate and regular rhythm.     Heart sounds: Normal heart sounds.     No gallop.  Pulmonary:     Effort: Pulmonary effort is normal. No respiratory distress.     Breath sounds: No stridor. Wheezing and rhonchi present. No rales.     Comments: Scattered rhonchi Some end exp wheeze No crackles or rales  Not shortness of breath  Abdominal:     General: There is no distension or abdominal bruit.     Palpations: Abdomen is soft.  Musculoskeletal:     Cervical back: Normal range of motion and neck supple.     Right lower leg: No edema.     Left lower leg: No edema.  Lymphadenopathy:     Cervical: No cervical adenopathy.  Skin:    General: Skin is warm and dry.     Coloration: Skin is not pale.     Findings: No rash.  Neurological:     Mental Status: She is alert.     Cranial Nerves: No cranial nerve deficit.     Coordination: Coordination normal.     Deep Tendon Reflexes: Reflexes are normal and symmetric. Reflexes normal.  Psychiatric:        Mood and Affect: Mood normal.           Assessment & Plan:   Problem List Items Addressed This Visit       Cardiovascular and Mediastinum   Benign essential hypertension    bp in fair control at this time  BP Readings from Last 1 Encounters:  10/24/22 136/82   No changes needed Most recent labs reviewed  Disc lifstyle change with low sodium diet and exercise  Plan to continue Amlofipine 5 mg daily Hydrochlorothiazide 25 mg daily   Labs ordered       Relevant Orders   Comprehensive metabolic panel   Lipid panel   Mild pulmonary hypertension (HCC)    Has had cardiology care for this and CAD History of copd in past         Respiratory   Chronic obstructive pulmonary disease (HCC) (Chronic)    History of this Refilled albuterol  She has acute viral bronchitis currently      Relevant Medications    predniSONE (DELTASONE) 20 MG tablet   benzonatate (TESSALON) 200 MG capsule   albuterol (VENTOLIN HFA) 108 (90 Base) MCG/ACT inhaler   Acute bronchitis    With mild reactive airways Suspect viral (also has strep throat right now)  Prescription prednisone 20 mg daily 5 d- discussed side effects Over the counter DM prn/fluids /symptoms care See AVS  Update if not starting to improve in a week or if worsening  Call back and Er  precautions noted in detail today        Strep throat - Primary    Strep test positive Treatment with amoxicillin   Also has viral bronchitis- prednisone prescription as well   Handout given See AVS for symptom care recommended   Update if not starting to improve in a week or if worsening  Call back and Er precautions noted in detail today        URI with cough and congestion    Positive strep test Neg covid  Suspect viral bronchitis-see a/p for that      Relevant Orders   POC COVID-19 BinaxNow (Completed)     Digestive   IBS (irritable bowel syndrome)    Chronic constipation is bothersome  Tried linzess years ago at highest dose and it helped but not affordable  This is generic now-encouraged pt to check on coverage and let her GI doctor know   In meantime keep up good fluids and fiber         Nervous and Auditory   Neuropathy    Pt is able to get by with lyrical 150 mg bid  Sent 30 pills to pharmacy to get while waiting on mail order   Sent 3 mo supply to mail order pharm   Pt is doing well overall  Tolerates medicine well        Other   Hyperlipidemia    Disc goals for lipids and reasons to control them Rev last labs with pt Rev low sat fat diet in detail Has history of CAD Due for labs today Takes simvastatin 20 mg daily       Iron deficiency anemia    Getting iron infusions Will cancel tomorrow's treatment due to strep throat       Prediabetes    Lab Results  Component Value Date   HGBA1C 5.9 (H) 08/04/2022    disc imp of low glycemic diet and wt loss to prevent DM2   No longer diabetic       Other Visit Diagnoses     Sore throat       Relevant Orders   Rapid Strep A (Completed)   Type 2 diabetes mellitus with hyperglycemia, without long-term current use of insulin (HCC)   (Chronic)

## 2022-10-24 NOTE — Assessment & Plan Note (Signed)
bp in fair control at this time  BP Readings from Last 1 Encounters:  10/24/22 136/82   No changes needed Most recent labs reviewed  Disc lifstyle change with low sodium diet and exercise  Plan to continue Amlofipine 5 mg daily Hydrochlorothiazide 25 mg daily   Labs ordered

## 2022-10-24 NOTE — Assessment & Plan Note (Signed)
Has had cardiology care for this and CAD History of copd in past

## 2022-10-24 NOTE — Patient Instructions (Addendum)
Check with your insurance re: cost of linzess  If it is covered give your GI doctor a call   Drink fluids Take amoxicillin for strep throat Take prednisone for bronchitis (may make you feel hyper and hungry)   Continue your current over the counter medicines -make sure no ingredients are repeated   Use albuterol inhaler as needed   Update if not starting to improve in a week or if worsening    Labs today

## 2022-10-24 NOTE — Assessment & Plan Note (Signed)
Disc goals for lipids and reasons to control them Rev last labs with pt Rev low sat fat diet in detail Has history of CAD Due for labs today Takes simvastatin 20 mg daily

## 2022-10-24 NOTE — Assessment & Plan Note (Signed)
Pt is able to get by with lyrical 150 mg bid  Sent 30 pills to pharmacy to get while waiting on mail order   Sent 3 mo supply to mail order pharm   Pt is doing well overall  Tolerates medicine well

## 2022-10-24 NOTE — Assessment & Plan Note (Signed)
Positive strep test Neg covid  Suspect viral bronchitis-see a/p for that

## 2022-10-24 NOTE — Assessment & Plan Note (Signed)
Lab Results  Component Value Date   HGBA1C 5.9 (H) 08/04/2022   disc imp of low glycemic diet and wt loss to prevent DM2   No longer diabetic

## 2022-10-25 ENCOUNTER — Inpatient Hospital Stay: Payer: Medicare Other

## 2022-10-26 DIAGNOSIS — G4733 Obstructive sleep apnea (adult) (pediatric): Secondary | ICD-10-CM | POA: Diagnosis not present

## 2022-10-27 ENCOUNTER — Inpatient Hospital Stay: Payer: Medicare Other

## 2022-10-31 DIAGNOSIS — M960 Pseudarthrosis after fusion or arthrodesis: Secondary | ICD-10-CM | POA: Diagnosis not present

## 2022-10-31 DIAGNOSIS — M4325 Fusion of spine, thoracolumbar region: Secondary | ICD-10-CM | POA: Diagnosis not present

## 2022-11-01 ENCOUNTER — Inpatient Hospital Stay: Payer: Medicare Other | Attending: Oncology

## 2022-11-01 VITALS — BP 123/63 | HR 66 | Temp 97.4°F | Resp 18

## 2022-11-01 DIAGNOSIS — D508 Other iron deficiency anemias: Secondary | ICD-10-CM

## 2022-11-01 DIAGNOSIS — D509 Iron deficiency anemia, unspecified: Secondary | ICD-10-CM | POA: Diagnosis not present

## 2022-11-01 MED ORDER — ONDANSETRON HCL 4 MG PO TABS
4.0000 mg | ORAL_TABLET | ORAL | Status: DC
  Filled 2022-11-01: qty 1

## 2022-11-01 MED ORDER — SODIUM CHLORIDE 0.9 % IV SOLN
200.0000 mg | Freq: Once | INTRAVENOUS | Status: AC
  Administered 2022-11-01: 200 mg via INTRAVENOUS
  Filled 2022-11-01: qty 200

## 2022-11-01 MED ORDER — SODIUM CHLORIDE 0.9 % IV SOLN
Freq: Once | INTRAVENOUS | Status: AC
  Filled 2022-11-01: qty 250

## 2022-11-01 NOTE — Patient Instructions (Signed)
[facility]  Discharge Instructions: Thank you for choosing [facility] to provide your oncology and hematology care.  If you have a lab appointment with the Cancer Center, please go directly to the Cancer Center and check in at the registration area.  Wear comfortable clothing and clothing appropriate for easy access to any Portacath or PICC line.   We strive to give you quality time with your provider. You may need to reschedule your appointment if you arrive late (15 or more minutes).  Arriving late affects you and other patients whose appointments are after yours.  Also, if you miss three or more appointments without notifying the office, you may be dismissed from the clinic at the provider's discretion.      For prescription refill requests, have your pharmacy contact our office and allow 72 hours for refills to be completed.    Today you received the following chemotherapy and/or immunotherapy agents venofer      To help prevent nausea and vomiting after your treatment, we encourage you to take your nausea medication as directed.  BELOW ARE SYMPTOMS THAT SHOULD BE REPORTED IMMEDIATELY: *FEVER GREATER THAN 100.4 F (38 C) OR HIGHER *CHILLS OR SWEATING *NAUSEA AND VOMITING THAT IS NOT CONTROLLED WITH YOUR NAUSEA MEDICATION *UNUSUAL SHORTNESS OF BREATH *UNUSUAL BRUISING OR BLEEDING *URINARY PROBLEMS (pain or burning when urinating, or frequent urination) *BOWEL PROBLEMS (unusual diarrhea, constipation, pain near the anus) TENDERNESS IN MOUTH AND THROAT WITH OR WITHOUT PRESENCE OF ULCERS (sore throat, sores in mouth, or a toothache) UNUSUAL RASH, SWELLING OR PAIN  UNUSUAL VAGINAL DISCHARGE OR ITCHING   Items with * indicate a potential emergency and should be followed up as soon as possible or go to the Emergency Department if any problems should occur.  Please show the CHEMOTHERAPY ALERT CARD or IMMUNOTHERAPY ALERT CARD at check-in to  the Emergency Department and triage nurse.  Should you have questions after your visit or need to cancel or reschedule your appointment, please contact [facility]  [phone] and follow the prompts.  Office hours are 8:00 a.m. to 4:30 p.m. Monday - Friday. Please note that voicemails left after 4:00 p.m. may not be returned until the following business day.  We are closed weekends and major holidays. You have access to a nurse at all times for urgent questions. Please call the main number to the clinic [phone] and follow the prompts.  For any non-urgent questions, you may also contact your provider using MyChart. We now offer e-Visits for anyone 18 and older to request care online for non-urgent symptoms. For details visit [email or web address].   Also download the MyChart app! Go to the app store, search "MyChart", open the app, select [facility], and log in with your MyChart username and password.    

## 2022-11-02 DIAGNOSIS — M960 Pseudarthrosis after fusion or arthrodesis: Secondary | ICD-10-CM | POA: Diagnosis not present

## 2022-11-02 DIAGNOSIS — M4325 Fusion of spine, thoracolumbar region: Secondary | ICD-10-CM | POA: Diagnosis not present

## 2022-11-03 ENCOUNTER — Inpatient Hospital Stay: Payer: Medicare Other

## 2022-11-03 ENCOUNTER — Encounter: Payer: Self-pay | Admitting: Family Medicine

## 2022-11-03 VITALS — BP 115/55 | HR 68 | Temp 97.8°F

## 2022-11-03 DIAGNOSIS — D508 Other iron deficiency anemias: Secondary | ICD-10-CM

## 2022-11-03 DIAGNOSIS — D509 Iron deficiency anemia, unspecified: Secondary | ICD-10-CM | POA: Diagnosis not present

## 2022-11-03 MED ORDER — SODIUM CHLORIDE 0.9 % IV SOLN
Freq: Once | INTRAVENOUS | Status: AC
  Filled 2022-11-03: qty 250

## 2022-11-03 MED ORDER — SODIUM CHLORIDE 0.9 % IV SOLN
200.0000 mg | Freq: Once | INTRAVENOUS | Status: AC
  Administered 2022-11-03: 200 mg via INTRAVENOUS
  Filled 2022-11-03: qty 200

## 2022-11-03 NOTE — Patient Instructions (Signed)
Iron Sucrose Injection What is this medication? IRON SUCROSE (EYE ern SOO krose) treats low levels of iron (iron deficiency anemia) in people with kidney disease. Iron is a mineral that plays an important role in making red blood cells, which carry oxygen from your lungs to the rest of your body. This medicine may be used for other purposes; ask your health care provider or pharmacist if you have questions. COMMON BRAND NAME(S): Venofer What should I tell my care team before I take this medication? They need to know if you have any of these conditions: Anemia not caused by low iron levels Heart disease High levels of iron in the blood Kidney disease Liver disease An unusual or allergic reaction to iron, other medications, foods, dyes, or preservatives Pregnant or trying to get pregnant Breastfeeding How should I use this medication? This medication is for infusion into a vein. It is given in a hospital or clinic setting. Talk to your care team about the use of this medication in children. While this medication may be prescribed for children as young as 2 years for selected conditions, precautions do apply. Overdosage: If you think you have taken too much of this medicine contact a poison control center or emergency room at once. NOTE: This medicine is only for you. Do not share this medicine with others. What if I miss a dose? Keep appointments for follow-up doses. It is important not to miss your dose. Call your care team if you are unable to keep an appointment. What may interact with this medication? Do not take this medication with any of the following: Deferoxamine Dimercaprol Other iron products This medication may also interact with the following: Chloramphenicol Deferasirox This list may not describe all possible interactions. Give your health care provider a list of all the medicines, herbs, non-prescription drugs, or dietary supplements you use. Also tell them if you smoke,  drink alcohol, or use illegal drugs. Some items may interact with your medicine. What should I watch for while using this medication? Visit your care team regularly. Tell your care team if your symptoms do not start to get better or if they get worse. You may need blood work done while you are taking this medication. You may need to follow a special diet. Talk to your care team. Foods that contain iron include: whole grains/cereals, dried fruits, beans, or peas, leafy green vegetables, and organ meats (liver, kidney). What side effects may I notice from receiving this medication? Side effects that you should report to your care team as soon as possible: Allergic reactions--skin rash, itching, hives, swelling of the face, lips, tongue, or throat Low blood pressure--dizziness, feeling faint or lightheaded, blurry vision Shortness of breath Side effects that usually do not require medical attention (report to your care team if they continue or are bothersome): Flushing Headache Joint pain Muscle pain Nausea Pain, redness, or irritation at injection site This list may not describe all possible side effects. Call your doctor for medical advice about side effects. You may report side effects to FDA at 1-800-FDA-1088. Where should I keep my medication? This medication is given in a hospital or clinic. It will not be stored at home. NOTE: This sheet is a summary. It may not cover all possible information. If you have questions about this medicine, talk to your doctor, pharmacist, or health care provider.  2024 Elsevier/Gold Standard (2022-07-21 00:00:00)

## 2022-11-06 ENCOUNTER — Ambulatory Visit
Admission: RE | Admit: 2022-11-06 | Discharge: 2022-11-06 | Disposition: A | Discharge status: Home / Self Care | Payer: Medicare Other | Source: Ambulatory Visit | Attending: Family Medicine | Admitting: Family Medicine

## 2022-11-06 DIAGNOSIS — Z1231 Encounter for screening mammogram for malignant neoplasm of breast: Secondary | ICD-10-CM | POA: Diagnosis not present

## 2022-11-06 MED ORDER — PREGABALIN 150 MG PO CAPS: ORAL_CAPSULE | ORAL | 0 refills | Status: DC

## 2022-11-06 NOTE — Addendum Note (Signed)
Addended by: Roxy Manns A on: 11/06/2022 09:02 PM   Modules accepted: Orders

## 2022-11-07 ENCOUNTER — Telehealth: Payer: Self-pay | Admitting: Family Medicine

## 2022-11-07 ENCOUNTER — Encounter: Payer: Self-pay | Admitting: Family Medicine

## 2022-11-07 DIAGNOSIS — M4325 Fusion of spine, thoracolumbar region: Secondary | ICD-10-CM | POA: Diagnosis not present

## 2022-11-07 DIAGNOSIS — M960 Pseudarthrosis after fusion or arthrodesis: Secondary | ICD-10-CM | POA: Diagnosis not present

## 2022-11-07 MED ORDER — EPINEPHRINE 0.3 MG/0.3ML IJ SOAJ: 0.3000 mg | INTRAMUSCULAR | 0 refills | Status: AC | PRN

## 2022-11-07 NOTE — Telephone Encounter (Signed)
Needs appointment to re check throat and check wound on hand and give a tetanus shot  Thanks for letting us know

## 2022-11-07 NOTE — Telephone Encounter (Signed)
Prescription Request  11/07/2022  LOV: 10/24/2022  What is the name of the medication or equipment? EPINEPHrine 0.3 mg/0.3 mL IJ SOAJ injection   Have you contacted your pharmacy to request a refill? Yes   Which pharmacy would you like this sent to?   [facility] - [place], [place] - [address] [place] [address] Phone: [phone] Fax: [phone]    Patient notified that their request is being sent to the clinical staff for review and that they should receive a response within 2 business days.   Please advise at Mobile [phone] (mobile)

## 2022-11-08 NOTE — Telephone Encounter (Signed)
Called patient Dr. Milinda Antis did not have anything this week. Have set up with  Dr. Alphonsus Sias tomorrow.

## 2022-11-09 ENCOUNTER — Ambulatory Visit (INDEPENDENT_AMBULATORY_CARE_PROVIDER_SITE_OTHER): Payer: Medicare Other | Admitting: Internal Medicine

## 2022-11-09 ENCOUNTER — Encounter: Payer: Self-pay | Admitting: Internal Medicine

## 2022-11-09 VITALS — BP 138/70 | HR 70 | Temp 97.5°F | Ht 59.0 in

## 2022-11-09 DIAGNOSIS — J209 Acute bronchitis, unspecified: Secondary | ICD-10-CM

## 2022-11-09 DIAGNOSIS — Z23 Encounter for immunization: Secondary | ICD-10-CM | POA: Diagnosis not present

## 2022-11-09 DIAGNOSIS — S61412A Laceration without foreign body of left hand, initial encounter: Secondary | ICD-10-CM | POA: Diagnosis not present

## 2022-11-09 MED ORDER — DOXYCYCLINE HYCLATE 100 MG PO TABS: 100.0000 mg | ORAL_TABLET | Freq: Two times a day (BID) | ORAL | 0 refills | Status: DC

## 2022-11-09 MED ORDER — MUPIROCIN 2 % EX OINT: TOPICAL_OINTMENT | Freq: Two times a day (BID) | CUTANEOUS | 0 refills | Status: DC

## 2022-11-09 NOTE — Assessment & Plan Note (Addendum)
Has not healed due to tough location Will have to granulate and heal over time Now with mild secondary infection---will give mupirocin and doxycycline 100 bid x 7 days Td update

## 2022-11-09 NOTE — Telephone Encounter (Signed)
Okay---I will check her today

## 2022-11-09 NOTE — Assessment & Plan Note (Signed)
Partial treatment of asthmatic bronchitis--some symptoms persist Will give the doxy for cellulitis and should help this also Not tight--can just use the albtuerol inhaler prn

## 2022-11-09 NOTE — Addendum Note (Signed)
Addended by: Eual Fines on: 11/09/2022 12:30 PM   Modules accepted: Orders

## 2022-11-09 NOTE — Progress Notes (Signed)
Subjective:    Patient ID: Erin Petersen, female    DOB: 16-Feb-1941, 82 y.o.   MRN: [id number]  HPI Here due to cut on hand and ongoing sore throat  Had strep and bronchitis 2 weeks ago Did improve somewhat but not the whole Still has sore throat--and voice is scratchy by the end of the day Some cough--using benzonatate at bedtime still No fever or trouble swallowing  Cut left hand---decorative glass broke and she was bringing it to curb 2 days ago Right on palm Keeps reopening when she uses her hand  Has albuterol HFA--but generally forgets to take it  Current Outpatient Medications on File Prior to Visit  Medication Sig Dispense Refill   albuterol (VENTOLIN HFA) 108 (90 Base) MCG/ACT inhaler Inhale 2 puffs into the lungs every 6 (six) hours as needed for wheezing or shortness of breath. 8 g 5   amitriptyline (ELAVIL) 25 MG tablet TAKE 1 TABLET AT BEDTIME 90 tablet 1   amLODipine (NORVASC) 5 MG tablet Take 1 tablet (5 mg total) by mouth daily. 1 tablet 0   aspirin 81 MG EC tablet Take 81 mg by mouth at bedtime.      benzonatate (TESSALON) 200 MG capsule Take 1 capsule (200 mg total) by mouth 3 (three) times daily as needed for cough. Swallow whole 30 capsule 1   bisacodyl (DULCOLAX) 5 MG EC tablet Take 1-2 tablets (5-10 mg total) by mouth daily as needed for moderate constipation. 30 tablet 0   CALCIUM-VITAMIN D-VITAMIN K PO Take 750 mg by mouth daily.     Cholecalciferol (VITAMIN D3) 50 MCG (2000 UT) TABS Take 2,000 Units by mouth daily.     Cyanocobalamin (VITAMIN B-12) 1000 MCG SUBL Take 2,000 mcg by mouth daily.     EPINEPHrine 0.3 mg/0.3 mL IJ SOAJ injection Inject 0.3 mg into the muscle as needed for anaphylaxis. 2 each 0   fluticasone (FLONASE) 50 MCG/ACT nasal spray Place 2 sprays into both nostrils daily. qhs     hydrochlorothiazide (HYDRODIURIL) 25 MG tablet TAKE 1 TABLET DAILY 90 tablet 1   Melatonin 10 MG CAPS Take 1 capsule by mouth as needed.     methocarbamol  (ROBAXIN) 500 MG tablet Take 1 tablet (500 mg total) by mouth every 6 (six) hours as needed for muscle spasms. 120 tablet 0   omeprazole (PRILOSEC) 40 MG capsule Take 1 capsule (40 mg total) by mouth 2 (two) times daily before a meal. (Patient taking differently: Take 40 mg by mouth daily.) 180 capsule 0   ondansetron (ZOFRAN-ODT) 4 MG disintegrating tablet Take 1 tablet (4 mg total) by mouth every 8 (eight) hours as needed for nausea or vomiting. 270 tablet 0   polyethylene glycol (MIRALAX / GLYCOLAX) 17 g packet Take 1 packet by mouth daily.      predniSONE (DELTASONE) 20 MG tablet Take 1 tablet (20 mg total) by mouth daily with breakfast. 5 tablet 0   pregabalin (LYRICA) 150 MG capsule Take one pill by mouth in the am and 2 in the pm 270 capsule 0   rOPINIRole (REQUIP) 1 MG tablet Take 1 mg by mouth in the morning and at bedtime. 1 in am and 1 at 3pm     rOPINIRole (REQUIP) 4 MG tablet TAKE 1 TABLET AT BEDTIME 90 tablet 3   simvastatin (ZOCOR) 20 MG tablet TAKE 1 TABLET AT BEDTIME 90 tablet 0   sucralfate (CARAFATE) 1 g tablet Take 1 tablet (1 g total) by mouth  4 (four) times daily -  with meals and at bedtime. 30 tablet 1   tiZANidine (ZANAFLEX) 4 MG tablet Take 4 mg by mouth 3 (three) times daily.     traMADol (ULTRAM) 50 MG tablet Take 1 tablet (50 mg total) by mouth every 12 (twelve) hours as needed for severe pain. 15 tablet 0   No current facility-administered medications on file prior to visit.    Allergies  Allergen Reactions   Bee Venom Anaphylaxis and Hives   Fosamax [Alendronate] Nausea Only    Stomach pain   Fusion Plus [Iron-Fa-B Cmp-C-Biot-Probiotic] Nausea Only    Stomach pain   Ciprofloxacin Hcl Rash    Mild rash after long term use   Sulfa Antibiotics Rash    Bactrim    Past Medical History:  Diagnosis Date   Anemia    Arthritis    Asthma    Bacteremia due to Gram-negative bacteria 06/04/2019   CAD (coronary artery disease) 07/21/2016   a.) LHC 07/21/2016: mild  nonobstructive CAD   Colon polyps    Complication of anesthesia    a.) anesthesia awareness   Compression fracture of L1 lumbar vertebra (HCC)    COPD (chronic obstructive pulmonary disease) (HCC)    Diastolic dysfunction    a.) TTE 03/02/2021: EF >55%, mild LVH, mild LAE, triv MR, mild TR/PR, G1DD   Duodenal ulcer    Dyspnea    GERD (gastroesophageal reflux disease)    History of 2019 novel coronavirus disease (COVID-19) 01/19/2021   a.) tested (+) at [facility]   History of lumbar fusion    a.) T12-S1   Hyperlipidemia    Hypertension    Insomnia    a.) takes melatonin PRN   OSA on CPAP    Osteopenia    Peripheral neuropathy    Plantar fasciitis    Pneumonia    Pulmonary hypertension (HCC)    a.) TTE 06/04/2019: moderate (PASP 40.3 mmHg)   Right inguinal hernia    a.) s/p repair 12/2016   RLS (restless legs syndrome)    a.) on ropinirole   SOB (shortness of breath)    T12 compression fracture (HCC)    Umbilical hernia    a.) s/p repair 12/2016   Urinary incontinence    Urinary tract infection    Wears hearing aid in both ears     Past Surgical History:  Procedure Laterality Date   ABDOMINAL HYSTERECTOMY  1972   APPENDECTOMY  1958   APPLICATION OF INTRAOPERATIVE CT SCAN N/A 04/17/2022   Procedure: APPLICATION OF INTRAOPERATIVE CT SCAN;  Surgeon: Venetia Night, MD;  Location: [facility];  Service: Neurosurgery;  Laterality: N/A;   BILATERAL SALPINGOOPHORECTOMY  01/07/2018   BIOPSY  04/15/2019   Procedure: BIOPSY;  Surgeon: Toney Reil, MD;  Location: [facility];  Service: Endoscopy;;   CARDIAC CATHETERIZATION Left 07/21/2016   COLONOSCOPY WITH PROPOFOL N/A 11/11/2018   Procedure: COLONOSCOPY WITH PROPOFOL;  Surgeon: Toney Reil, MD;  Location: [facility];  Service: Gastroenterology;  Laterality: N/A;   CYSTO WITH HYDRODISTENSION N/A 12/30/2018   Procedure: CYSTOSCOPY/HYDRODISTENSION WITH MARCAINE;  Surgeon: Alfredo Martinez, MD;   Location: [facility];  Service: Urology;  Laterality: N/A;   ESOPHAGOGASTRODUODENOSCOPY (EGD) WITH PROPOFOL N/A 04/15/2019   Procedure: ESOPHAGOGASTRODUODENOSCOPY (EGD) WITH PROPOFOL;  Surgeon: Toney Reil, MD;  Location: [facility];  Service: Endoscopy;  Laterality: N/A;  sleep apnea   ESOPHAGOGASTRODUODENOSCOPY (EGD) WITH PROPOFOL N/A 07/09/2019   Procedure: ESOPHAGOGASTRODUODENOSCOPY (EGD) WITH PROPOFOL;  Surgeon:  Toney Reil, MD;  Location: [facility];  Service: Gastroenterology;  Laterality: N/A;   ESOPHAGOGASTRODUODENOSCOPY (EGD) WITH PROPOFOL N/A 06/15/2022   Procedure: ESOPHAGOGASTRODUODENOSCOPY (EGD) WITH PROPOFOL;  Surgeon: Toney Reil, MD;  Location: [facility];  Service: Endoscopy;  Laterality: N/A;  sleep apnea   EYE SURGERY Bilateral    INGUINAL HERNIA REPAIR Bilateral 01/05/2017   JOINT REPLACEMENT     LAPAROSCOPIC BILATERAL SALPINGO OOPHERECTOMY  2019   LUMBAR FUSION  2002   LUMBAR FUSION  11/14/2021   Dr Sharolyn Douglas   LUMBAR FUSION  12/14/2021   Dr Malachy Chamber   SPINAL FUSION  2002   TONSILLECTOMY AND ADENOIDECTOMY  1961   TOTAL KNEE ARTHROPLASTY Right 01/01/2020   Procedure: Right total knee arthroplasty - [place] Neighbor to Assist;  Surgeon: Kennedy Bucker, MD;  Location: [facility];  Service: Orthopedics;  Laterality: Right;   TOTAL KNEE ARTHROPLASTY Left 10/12/2020   Procedure: TOTAL KNEE ARTHROPLASTY;  Surgeon: Kennedy Bucker, MD;  Location: [facility];  Service: Orthopedics;  Laterality: Left;   TUBAL LIGATION     UMBILICAL HERNIA REPAIR N/A 01/05/2017    Family History  Problem Relation Age of Onset   Alcohol abuse Mother    Cancer Mother    Stroke Mother    Alcohol abuse Father    Arthritis Sister    Arthritis Daughter    COPD Daughter    Arthritis Paternal Grandmother    Asthma Paternal Grandmother    COPD Paternal Grandmother    Breast cancer Paternal Aunt     Social History   Socioeconomic History   Marital  status: Widowed    Spouse name: Not on file   Number of children: 3   Years of education: Not on file   Highest education level: Associate degree: academic program  Occupational History   Not on file  Tobacco Use   Smoking status: Never   Smokeless tobacco: Never  Vaping Use   Vaping status: Never Used  Substance and Sexual Activity   Alcohol use: Not Currently   Drug use: Never   Sexual activity: Not Currently  Other Topics Concern   Not on file  Social History Narrative   Moved from [place] 2020, lives near daughter. Retired. Enjoys walking. Lives alone   Social Determinants of Health   Financial Resource Strain: Medium Risk (10/02/2022)   Overall Financial Resource Strain (CARDIA)    Difficulty of Paying Living Expenses: Somewhat hard  Food Insecurity: No Food Insecurity (10/02/2022)   Hunger Vital Sign    Worried About Running Out of Food in the Last Year: Never true    Ran Out of Food in the Last Year: Never true  Transportation Needs: No Transportation Needs (10/02/2022)   PRAPARE - Administrator, Civil Service (Medical): No    Lack of Transportation (Non-Medical): No  Physical Activity: Insufficiently Active (10/02/2022)   Exercise Vital Sign    Days of Exercise per Week: 5 days    Minutes of Exercise per Session: 10 min  Stress: No Stress Concern Present (10/02/2022)   [employer] of Occupational Health - Occupational Stress Questionnaire    Feeling of Stress : Not at all  Social Connections: Moderately Integrated (10/02/2022)   Social Connection and Isolation Panel [NHANES]    Frequency of Communication with Friends and Family: More than three times a week    Frequency of Social Gatherings with Friends and Family: More than three times a week    Attends Religious  Services: More than 4 times per year    Active Member of Clubs or Organizations: Yes    Attends Banker Meetings: More than 4 times per year    Marital Status: Widowed  Intimate  Partner Violence: Not At Risk (10/02/2022)   Humiliation, Afraid, Rape, and Kick questionnaire    Fear of Current or Ex-Partner: No    Emotionally Abused: No    Physically Abused: No    Sexually Abused: No   Review of Systems Hears some noise in chest at night Eating okay     Objective:   Physical Exam Constitutional:      Appearance: She is well-developed.  HENT:     Mouth/Throat:     Pharynx: No oropharyngeal exudate or posterior oropharyngeal erythema.  Pulmonary:     Effort: Pulmonary effort is normal.     Breath sounds: Normal breath sounds. No wheezing or rales.  Musculoskeletal:     Cervical back: Neck supple.  Lymphadenopathy:     Cervical: No cervical adenopathy.  Skin:    Comments: 2 small lacerations and a small ulcer (injury) on left palm Surrounding tenderness and slight red/warmth  Neurological:     Mental Status: She is alert.            Assessment & Plan:

## 2022-11-10 DIAGNOSIS — M960 Pseudarthrosis after fusion or arthrodesis: Secondary | ICD-10-CM | POA: Diagnosis not present

## 2022-11-10 DIAGNOSIS — M4325 Fusion of spine, thoracolumbar region: Secondary | ICD-10-CM | POA: Diagnosis not present

## 2022-11-14 DIAGNOSIS — M4325 Fusion of spine, thoracolumbar region: Secondary | ICD-10-CM | POA: Diagnosis not present

## 2022-11-14 DIAGNOSIS — M792 Neuralgia and neuritis, unspecified: Secondary | ICD-10-CM | POA: Diagnosis not present

## 2022-11-14 DIAGNOSIS — M960 Pseudarthrosis after fusion or arthrodesis: Secondary | ICD-10-CM | POA: Diagnosis not present

## 2022-11-14 DIAGNOSIS — R4189 Other symptoms and signs involving cognitive functions and awareness: Secondary | ICD-10-CM | POA: Diagnosis not present

## 2022-11-17 DIAGNOSIS — M4325 Fusion of spine, thoracolumbar region: Secondary | ICD-10-CM | POA: Diagnosis not present

## 2022-11-17 DIAGNOSIS — M960 Pseudarthrosis after fusion or arthrodesis: Secondary | ICD-10-CM | POA: Diagnosis not present

## 2022-11-22 DIAGNOSIS — M4325 Fusion of spine, thoracolumbar region: Secondary | ICD-10-CM | POA: Diagnosis not present

## 2022-11-22 DIAGNOSIS — M960 Pseudarthrosis after fusion or arthrodesis: Secondary | ICD-10-CM | POA: Diagnosis not present

## 2022-11-24 DIAGNOSIS — M4325 Fusion of spine, thoracolumbar region: Secondary | ICD-10-CM | POA: Diagnosis not present

## 2022-11-24 DIAGNOSIS — M960 Pseudarthrosis after fusion or arthrodesis: Secondary | ICD-10-CM | POA: Diagnosis not present

## 2022-11-27 ENCOUNTER — Other Ambulatory Visit: Payer: Self-pay | Admitting: Family Medicine

## 2022-11-28 DIAGNOSIS — M960 Pseudarthrosis after fusion or arthrodesis: Secondary | ICD-10-CM | POA: Diagnosis not present

## 2022-11-28 DIAGNOSIS — M4325 Fusion of spine, thoracolumbar region: Secondary | ICD-10-CM | POA: Diagnosis not present

## 2022-11-30 DIAGNOSIS — M960 Pseudarthrosis after fusion or arthrodesis: Secondary | ICD-10-CM | POA: Diagnosis not present

## 2022-11-30 DIAGNOSIS — M4325 Fusion of spine, thoracolumbar region: Secondary | ICD-10-CM | POA: Diagnosis not present

## 2022-12-05 DIAGNOSIS — M960 Pseudarthrosis after fusion or arthrodesis: Secondary | ICD-10-CM | POA: Diagnosis not present

## 2022-12-05 DIAGNOSIS — M4325 Fusion of spine, thoracolumbar region: Secondary | ICD-10-CM | POA: Diagnosis not present

## 2022-12-07 DIAGNOSIS — M960 Pseudarthrosis after fusion or arthrodesis: Secondary | ICD-10-CM | POA: Diagnosis not present

## 2022-12-07 DIAGNOSIS — M4325 Fusion of spine, thoracolumbar region: Secondary | ICD-10-CM | POA: Diagnosis not present

## 2022-12-12 ENCOUNTER — Encounter: Payer: Self-pay | Admitting: Neurosurgery

## 2022-12-12 ENCOUNTER — Ambulatory Visit: Payer: Medicare Other | Admitting: Neurosurgery

## 2022-12-12 VITALS — BP 140/72 | Ht 58.25 in | Wt 172.0 lb

## 2022-12-12 DIAGNOSIS — M419 Scoliosis, unspecified: Secondary | ICD-10-CM

## 2022-12-12 DIAGNOSIS — M4325 Fusion of spine, thoracolumbar region: Secondary | ICD-10-CM | POA: Diagnosis not present

## 2022-12-12 NOTE — Progress Notes (Signed)
   REFERRING PHYSICIAN:  Judy Pimple, Md [address] [place],  [place] [address]  DOS: T10- pelvis fusion. T12-L1 posterior column osteotomy 04/17/22  HISTORY OF PRESENT ILLNESS: Erin Petersen is status post thoracopelvic fusion.    She is doing somewhat better than the last time I saw her.  However, she continues to have pain in her mid back when she changes positions prickly rolling from front to back.  She has some trouble standing up straight.  She does better when she walks with walking sticks.  She has continued to do physical therapy.  PHYSICAL EXAMINATION:  General: Patient is well developed, well nourished, calm, collected, and in no apparent distress.   NEUROLOGICAL:  General: In no acute distress.   Awake, alert, oriented to person, place, and time.  Pupils equal round and reactive to light.  Facial tone is symmetric.  Tongue protrusion is midline.  There is no pronator drift.   Strength:            Side Iliopsoas Quads Hamstring PF DF EHL  R 5 5 5 5 5 5   L 5 5 5 5 5 5    Incision c/d/I and healing well   ROS (Neurologic):  Negative except as noted above  IMAGING: No complications noted on x-rays from August 2024  ASSESSMENT/PLAN:  Erin Petersen is doing fair after spinal fusion.  She has made some improvements over the past 2 months.  I will see her back in 3 months with x-rays.  She is off all restrictions.  I think she should consider physical therapy.  We reviewed that she has improvement in her alignment compared to prior to surgery, but that the optimal surgery for realignment (pedicle subtraction osteotomy) is not a great option for her given its risks.  I will see her back in 3 months with x-rays and we will discuss any further options at that time.  I spent a total of 15 minutes in this patient's care today. This time was spent reviewing pertinent records including imaging studies, obtaining and confirming history, performing a directed  evaluation, formulating and discussing my recommendations, and documenting the visit within the medical record.   Venetia Night MD Department of neurosurgery

## 2022-12-14 DIAGNOSIS — M960 Pseudarthrosis after fusion or arthrodesis: Secondary | ICD-10-CM | POA: Diagnosis not present

## 2022-12-14 DIAGNOSIS — M4325 Fusion of spine, thoracolumbar region: Secondary | ICD-10-CM | POA: Diagnosis not present

## 2023-01-01 ENCOUNTER — Other Ambulatory Visit: Payer: Self-pay | Admitting: Family Medicine

## 2023-01-05 ENCOUNTER — Other Ambulatory Visit: Payer: Self-pay | Admitting: *Deleted

## 2023-01-05 ENCOUNTER — Inpatient Hospital Stay: Payer: Medicare Other | Attending: Oncology

## 2023-01-05 DIAGNOSIS — D509 Iron deficiency anemia, unspecified: Secondary | ICD-10-CM | POA: Diagnosis not present

## 2023-01-05 LAB — CBC
HCT: 43.9 % (ref 36.0–46.0)
Hemoglobin: 14.4 g/dL (ref 12.0–15.0)
MCH: 27.7 pg (ref 26.0–34.0)
MCHC: 32.8 g/dL (ref 30.0–36.0)
MCV: 84.6 fL (ref 80.0–100.0)
Platelets: 278 10*3/uL (ref 150–400)
RBC: 5.19 MIL/uL — ABNORMAL HIGH (ref 3.87–5.11)
RDW: 14.4 % (ref 11.5–15.5)
WBC: 9.1 10*3/uL (ref 4.0–10.5)
nRBC: 0 % (ref 0.0–0.2)

## 2023-01-05 LAB — IRON AND TIBC
Iron: 73 ug/dL (ref 28–170)
Saturation Ratios: 20 % (ref 10.4–31.8)
TIBC: 364 ug/dL (ref 250–450)
UIBC: 291 ug/dL

## 2023-01-05 LAB — FERRITIN: Ferritin: 122 ng/mL (ref 11–307)

## 2023-01-08 ENCOUNTER — Other Ambulatory Visit: Payer: Self-pay | Admitting: Neurosurgery

## 2023-01-08 ENCOUNTER — Other Ambulatory Visit: Payer: Self-pay | Admitting: Gastroenterology

## 2023-01-08 NOTE — Telephone Encounter (Signed)
Refill for celebrex.   Per chart, she is not taking this. Refill denied.

## 2023-01-08 NOTE — Telephone Encounter (Signed)
Yes she requested the refill.

## 2023-01-10 DIAGNOSIS — M960 Pseudarthrosis after fusion or arthrodesis: Secondary | ICD-10-CM | POA: Diagnosis not present

## 2023-01-10 DIAGNOSIS — M4325 Fusion of spine, thoracolumbar region: Secondary | ICD-10-CM | POA: Diagnosis not present

## 2023-01-10 MED ORDER — OMEPRAZOLE 40 MG PO CPDR: 40.0000 mg | DELAYED_RELEASE_CAPSULE | Freq: Two times a day (BID) | ORAL | 0 refills | Status: DC

## 2023-01-10 NOTE — Addendum Note (Signed)
Addended by: Radene Knee L on: 01/10/2023 11:58 AM   Modules accepted: Orders

## 2023-01-16 ENCOUNTER — Other Ambulatory Visit: Payer: Self-pay | Admitting: Family Medicine

## 2023-01-16 DIAGNOSIS — M960 Pseudarthrosis after fusion or arthrodesis: Secondary | ICD-10-CM | POA: Diagnosis not present

## 2023-01-16 DIAGNOSIS — E7800 Pure hypercholesterolemia, unspecified: Secondary | ICD-10-CM

## 2023-01-16 DIAGNOSIS — M4325 Fusion of spine, thoracolumbar region: Secondary | ICD-10-CM | POA: Diagnosis not present

## 2023-01-17 ENCOUNTER — Ambulatory Visit: Payer: Medicare Other

## 2023-01-17 DIAGNOSIS — M4325 Fusion of spine, thoracolumbar region: Secondary | ICD-10-CM

## 2023-01-17 DIAGNOSIS — M419 Scoliosis, unspecified: Secondary | ICD-10-CM

## 2023-01-17 MED ORDER — CELECOXIB 200 MG PO CAPS: 200.0000 mg | ORAL_CAPSULE | Freq: Every day | ORAL | 0 refills | Status: DC

## 2023-01-17 NOTE — Telephone Encounter (Signed)
T10- pelvis fusion. T12-L1 posterior column osteotomy 04/17/22   Last seen 12/12/22 and PT recommended. Has f/u with Myer Haff in 3 months.   Can do celebrex 200mg  one a day. Prescription sent to pharmacy and she was sent a message.   Allergy noted to sulfa. She has been taking celebrex since her surgery with no side effects.

## 2023-01-22 DIAGNOSIS — M4325 Fusion of spine, thoracolumbar region: Secondary | ICD-10-CM | POA: Diagnosis not present

## 2023-01-22 DIAGNOSIS — M960 Pseudarthrosis after fusion or arthrodesis: Secondary | ICD-10-CM | POA: Diagnosis not present

## 2023-01-29 DIAGNOSIS — M4325 Fusion of spine, thoracolumbar region: Secondary | ICD-10-CM | POA: Diagnosis not present

## 2023-01-29 DIAGNOSIS — M960 Pseudarthrosis after fusion or arthrodesis: Secondary | ICD-10-CM | POA: Diagnosis not present

## 2023-02-04 ENCOUNTER — Encounter: Payer: Self-pay | Admitting: Family Medicine

## 2023-02-05 ENCOUNTER — Ambulatory Visit: Payer: Medicare Other | Admitting: Physician Assistant

## 2023-02-05 ENCOUNTER — Encounter: Payer: Self-pay | Admitting: Physician Assistant

## 2023-02-05 VITALS — BP 130/73 | HR 83 | Temp 98.5°F | Resp 16 | Ht 59.0 in | Wt 172.0 lb

## 2023-02-05 DIAGNOSIS — G4733 Obstructive sleep apnea (adult) (pediatric): Secondary | ICD-10-CM

## 2023-02-05 DIAGNOSIS — J45990 Exercise induced bronchospasm: Secondary | ICD-10-CM | POA: Diagnosis not present

## 2023-02-05 DIAGNOSIS — Z7189 Other specified counseling: Secondary | ICD-10-CM

## 2023-02-05 MED ORDER — AMLODIPINE BESYLATE 5 MG PO TABS: 5.0000 mg | ORAL_TABLET | Freq: Every day | ORAL | 2 refills | Status: AC

## 2023-02-05 NOTE — Telephone Encounter (Signed)
Amlodipine isn't on pt's med list anymore, ? If pt is suppose to be on Amlodipine

## 2023-02-05 NOTE — Progress Notes (Signed)
[facility] [address] [place], [place] [address]  Pulmonary Sleep Medicine   Office Visit Note  Patient Name: Erin Petersen DOB: 1940-12-07 MRN [id number]  Date of Service: 02/05/2023  Complaints/HPI: Pt is here for routine follow up for OSA on CPAP. Has been using CPAP again and is feeling better overall and benefiting from use.  Does have some dryness  and wears a nasal mask and will add chin strap at times when needed. Has had a little wheezing and SOB and is using albuterol daily now with weather changes. She does monitor her oxygen and is staying the 90s. Has been on maintenance inhalers previously, but actually would feel worse on these and is hesitant to start another at this time. States she has a record of these and will let office know in case we need to try alternative in future. Advised to let office know if any changes.  Compliance Download: Settings: APAP 4-20cm H2O >4 hours: 97% AHI: 0.8 Avg use: 7.5 hours  ROS  General: (-) fever, (-) chills, (-) night sweats, (-) weakness Skin: (-) rashes, (-) itching,. Eyes: (-) visual changes, (-) redness, (-) itching. Nose and Sinuses: (-) nasal stuffiness or itchiness, (-) postnasal drip, (-) nosebleeds, (-) sinus trouble. Mouth and Throat: (-) sore throat, (-) hoarseness. Neck: (-) swollen glands, (-) enlarged thyroid, (-) neck pain. Respiratory: - cough, (-) bloody sputum, + shortness of breath, + wheezing. Cardiovascular: - ankle swelling, (-) chest pain. Lymphatic: (-) lymph node enlargement. Neurologic: (-) numbness, (-) tingling. Psychiatric: (-) anxiety, (-) depression   Current Medication: Outpatient Encounter Medications as of 02/05/2023  Medication Sig   albuterol (VENTOLIN HFA) 108 (90 Base) MCG/ACT inhaler Inhale 2 puffs into the lungs every 6 (six) hours as needed for wheezing or shortness of breath.   amitriptyline (ELAVIL) 25 MG tablet TAKE 1 TABLET AT BEDTIME   amLODipine (NORVASC) 5 MG  tablet Take 1 tablet (5 mg total) by mouth daily.   aspirin 81 MG EC tablet Take 81 mg by mouth at bedtime.    celecoxib (CELEBREX) 200 MG capsule Take 1 capsule (200 mg total) by mouth daily.   Cholecalciferol (VITAMIN D3) 50 MCG (2000 UT) TABS Take 2,000 Units by mouth daily.   Cyanocobalamin (VITAMIN B-12) 1000 MCG SUBL Take 2,000 mcg by mouth daily.   EPINEPHrine 0.3 mg/0.3 mL IJ SOAJ injection Inject 0.3 mg into the muscle as needed for anaphylaxis.   fluticasone (FLONASE) 50 MCG/ACT nasal spray Place 2 sprays into both nostrils daily. qhs   hydrochlorothiazide (HYDRODIURIL) 25 MG tablet TAKE 1 TABLET DAILY   Melatonin 10 MG CAPS Take 1 capsule by mouth as needed.   methocarbamol (ROBAXIN) 500 MG tablet Take 1 tablet (500 mg total) by mouth every 6 (six) hours as needed for muscle spasms.   mupirocin ointment (BACTROBAN) 2 % Apply topically 2 (two) times daily.   omeprazole (PRILOSEC) 40 MG capsule Take 1 capsule (40 mg total) by mouth 2 (two) times daily before a meal.   ondansetron (ZOFRAN-ODT) 4 MG disintegrating tablet Take 1 tablet (4 mg total) by mouth every 8 (eight) hours as needed for nausea or vomiting.   polyethylene glycol (MIRALAX / GLYCOLAX) 17 g packet Take 1 packet by mouth daily.    pregabalin (LYRICA) 150 MG capsule Take one pill by mouth in the am and 2 in the pm   rOPINIRole (REQUIP) 1 MG tablet Take 1 mg by mouth in the morning and at bedtime. 1 in  am and 1 at 3pm   rOPINIRole (REQUIP) 4 MG tablet TAKE 1 TABLET AT BEDTIME   simvastatin (ZOCOR) 20 MG tablet TAKE 1 TABLET AT BEDTIME   tiZANidine (ZANAFLEX) 4 MG tablet Take 4 mg by mouth 3 (three) times daily.   traMADol (ULTRAM) 50 MG tablet Take 1 tablet (50 mg total) by mouth every 12 (twelve) hours as needed for severe pain.   sucralfate (CARAFATE) 1 g tablet Take 1 tablet (1 g total) by mouth 4 (four) times daily -  with meals and at bedtime. (Patient not taking: Reported on 12/12/2022)   No facility-administered  encounter medications on file as of 02/05/2023.    Surgical History: Past Surgical History:  Procedure Laterality Date   ABDOMINAL HYSTERECTOMY  1972   APPENDECTOMY  1958   APPLICATION OF INTRAOPERATIVE CT SCAN N/A 04/17/2022   Procedure: APPLICATION OF INTRAOPERATIVE CT SCAN;  Surgeon: Venetia Night, MD;  Location: [facility] ORS;  Service: Neurosurgery;  Laterality: N/A;   BILATERAL SALPINGOOPHORECTOMY  01/07/2018   BIOPSY  04/15/2019   Procedure: BIOPSY;  Surgeon: Toney Reil, MD;  Location: [facility];  Service: Endoscopy;;   CARDIAC CATHETERIZATION Left 07/21/2016   COLONOSCOPY WITH PROPOFOL N/A 11/11/2018   Procedure: COLONOSCOPY WITH PROPOFOL;  Surgeon: Toney Reil, MD;  Location: [facility] ENDOSCOPY;  Service: Gastroenterology;  Laterality: N/A;   CYSTO WITH HYDRODISTENSION N/A 12/30/2018   Procedure: CYSTOSCOPY/HYDRODISTENSION WITH MARCAINE;  Surgeon: Alfredo Martinez, MD;  Location: [facility] OR[facility];  Service: Urology;  Laterality: N/A;   ESOPHAGOGASTRODUODENOSCOPY (EGD) WITH PROPOFOL N/A 04/15/2019   Procedure: ESOPHAGOGASTRODUODENOSCOPY (EGD) WITH PROPOFOL;  Surgeon: Toney Reil, MD;  Location: [facility];  Service: Endoscopy;  Laterality: N/A;  sleep apnea   ESOPHAGOGASTRODUODENOSCOPY (EGD) WITH PROPOFOL N/A 07/09/2019   Procedure: ESOPHAGOGASTRODUODENOSCOPY (EGD) WITH PROPOFOL;  Surgeon: Toney Reil, MD;  Location: [facility] ENDOSCOPY;  Service: Gastroenterology;  Laterality: N/A;   ESOPHAGOGASTRODUODENOSCOPY (EGD) WITH PROPOFOL N/A 06/15/2022   Procedure: ESOPHAGOGASTRODUODENOSCOPY (EGD) WITH PROPOFOL;  Surgeon: Toney Reil, MD;  Location: [facility];  Service: Endoscopy;  Laterality: N/A;  sleep apnea   EYE SURGERY Bilateral    INGUINAL HERNIA REPAIR Bilateral 01/05/2017   JOINT REPLACEMENT     LAPAROSCOPIC BILATERAL SALPINGO OOPHERECTOMY  2019   LUMBAR FUSION  2002   LUMBAR FUSION  11/14/2021   Dr Sharolyn Douglas   LUMBAR FUSION   12/14/2021   Dr Malachy Chamber   SPINAL FUSION  2002   TONSILLECTOMY AND ADENOIDECTOMY  1961   TOTAL KNEE ARTHROPLASTY Right 01/01/2020   Procedure: Right total knee arthroplasty - [place] Neighbor to Assist;  Surgeon: Kennedy Bucker, MD;  Location: [facility] ORS;  Service: Orthopedics;  Laterality: Right;   TOTAL KNEE ARTHROPLASTY Left 10/12/2020   Procedure: TOTAL KNEE ARTHROPLASTY;  Surgeon: Kennedy Bucker, MD;  Location: [facility] OR[facility];  Service: Orthopedics;  Laterality: Left;   TUBAL LIGATION     UMBILICAL HERNIA REPAIR N/A 01/05/2017    Medical History: Past Medical History:  Diagnosis Date   Anemia    Arthritis    Asthma    Bacteremia due to Gram-negative bacteria 06/04/2019   CAD (coronary artery disease) 07/21/2016   a.) LHC 07/21/2016: mild nonobstructive CAD   Colon polyps    Complication of anesthesia    a.) anesthesia awareness   Compression fracture of L1 lumbar vertebra (HCC)    COPD (chronic obstructive pulmonary disease) (HCC)    Diastolic dysfunction    a.) TTE 03/02/2021: EF >55%, mild LVH,  mild LAE, triv MR, mild TR/PR, G1DD   Duodenal ulcer    Dyspnea    GERD (gastroesophageal reflux disease)    History of 2019 novel coronavirus disease (COVID-19) 01/19/2021   a.) tested (+) at [facility]   History of lumbar fusion    a.) T12-S1   Hyperlipidemia    Hypertension    Insomnia    a.) takes melatonin PRN   OSA on CPAP    Osteopenia    Peripheral neuropathy    Plantar fasciitis    Pneumonia    Pulmonary hypertension (HCC)    a.) TTE 06/04/2019: moderate (PASP 40.3 mmHg)   Right inguinal hernia    a.) s/p repair 12/2016   RLS (restless legs syndrome)    a.) on ropinirole   SOB (shortness of breath)    T12 compression fracture (HCC)    Umbilical hernia    a.) s/p repair 12/2016   Urinary incontinence    Urinary tract infection    Wears hearing aid in both ears     Family History: Family History  Problem Relation Age of Onset   Alcohol abuse Mother    Cancer  Mother    Stroke Mother    Alcohol abuse Father    Arthritis Sister    Arthritis Daughter    COPD Daughter    Arthritis Paternal Grandmother    Asthma Paternal Grandmother    COPD Paternal Grandmother    Breast cancer Paternal Aunt     Social History: Social History   Socioeconomic History   Marital status: Widowed    Spouse name: Not on file   Number of children: 3   Years of education: Not on file   Highest education level: Associate degree: academic program  Occupational History   Not on file  Tobacco Use   Smoking status: Never   Smokeless tobacco: Never  Vaping Use   Vaping status: Never Used  Substance and Sexual Activity   Alcohol use: Not Currently   Drug use: Never   Sexual activity: Not Currently  Other Topics Concern   Not on file  Social History Narrative   Moved from [place] 2020, lives near daughter. Retired. Enjoys walking. Lives alone   Social Determinants of Health   Financial Resource Strain: Medium Risk (10/02/2022)   Overall Financial Resource Strain (CARDIA)    Difficulty of Paying Living Expenses: Somewhat hard  Food Insecurity: No Food Insecurity (10/02/2022)   Hunger Vital Sign    Worried About Running Out of Food in the Last Year: Never true    Ran Out of Food in the Last Year: Never true  Transportation Needs: No Transportation Needs (10/02/2022)   PRAPARE - Administrator, Civil Service (Medical): No    Lack of Transportation (Non-Medical): No  Physical Activity: Insufficiently Active (10/02/2022)   Exercise Vital Sign    Days of Exercise per Week: 5 days    Minutes of Exercise per Session: 10 min  Stress: No Stress Concern Present (10/02/2022)   [employer] of Occupational Health - Occupational Stress Questionnaire    Feeling of Stress : Not at all  Social Connections: Moderately Integrated (10/02/2022)   Social Connection and Isolation Panel [NHANES]    Frequency of Communication with Friends and Family: More than three times  a week    Frequency of Social Gatherings with Friends and Family: More than three times a week    Attends Religious Services: More than 4 times per year  Active Member of Clubs or Organizations: Yes    Attends Banker Meetings: More than 4 times per year    Marital Status: Widowed  Intimate Partner Violence: Not At Risk (10/02/2022)   Humiliation, Afraid, Rape, and Kick questionnaire    Fear of Current or Ex-Partner: No    Emotionally Abused: No    Physically Abused: No    Sexually Abused: No    Vital Signs: Blood pressure 130/73, pulse 83, temperature 98.5 F (36.9 C), resp. rate 16, height 4\' 11"  (1.499 m), weight 172 lb (78 kg), SpO2 96%.  Examination: General Appearance: The patient is well-developed, well-nourished, and in no distress. Skin: Gross inspection of skin unremarkable. Head: normocephalic, no gross deformities. Eyes: no gross deformities noted. ENT: ears appear grossly normal no exudates. Neck: Supple. No thyromegaly. No LAD. Respiratory: Lungs clear to auscultation. Cardiovascular: Normal S1 and S2 without murmur or rub. Extremities: No cyanosis. pulses are equal. Neurologic: Alert and oriented. No involuntary movements.  LABS: Recent Results (from the past 2160 hour(s))  Iron and TIBC     Status: None   Collection Time: 01/05/23  1:48 PM  Result Value Ref Range   Iron 73 28 - 170 ug/dL   TIBC 528 413 - 244 ug/dL   Saturation Ratios 20 10.4 - 31.8 %   UIBC 291 ug/dL    Comment: Performed at [facility], [address] [place], [place] [address]  Ferritin     Status: None   Collection Time: 01/05/23  1:48 PM  Result Value Ref Range   Ferritin 122 11 - 307 ng/mL    Comment: Performed at [facility], [address] [place], [place] [address]  CBC     Status: Abnormal   Collection Time: 01/05/23  1:48 PM  Result Value Ref Range   WBC 9.1 4.0 - 10.5 K/uL   RBC 5.19 (H) 3.87 - 5.11 MIL/uL   Hemoglobin 14.4 12.0 - 15.0  g/dL   HCT 44.0 34.7 - 42.5 %   MCV 84.6 80.0 - 100.0 fL   MCH 27.7 26.0 - 34.0 pg   MCHC 32.8 30.0 - 36.0 g/dL   RDW 95.6 38.7 - 56.4 %   Platelets 278 150 - 400 K/uL   nRBC 0.0 0.0 - 0.2 %    Comment: Performed at [facility], [address] [place], [place] [address]    Radiology: MM 3D SCREENING MAMMOGRAM BILATERAL BREAST  Result Date: 11/07/2022 CLINICAL DATA:  Screening. EXAM: DIGITAL SCREENING BILATERAL MAMMOGRAM WITH TOMOSYNTHESIS AND CAD TECHNIQUE: Bilateral screening digital craniocaudal and mediolateral oblique mammograms were obtained. Bilateral screening digital breast tomosynthesis was performed. The images were evaluated with computer-aided detection. COMPARISON:  Previous exam(s). ACR Breast Density Category b: There are scattered areas of fibroglandular density. FINDINGS: There are no findings suspicious for malignancy. IMPRESSION: No mammographic evidence of malignancy. A result letter of this screening mammogram will be mailed directly to the patient. RECOMMENDATION: Screening mammogram in one year. (Code:SM-B-01Y) BI-RADS CATEGORY  1: Negative. Electronically Signed   By: Harmon Pier M.D.   On: 11/07/2022 15:20    No results found.  No results found.    Assessment and Plan: Patient Active Problem List   Diagnosis Date Noted   Laceration of left hand 11/09/2022   URI with cough and congestion 10/24/2022   Acute bronchitis 10/24/2022   Strep throat 10/24/2022   Headache 08/02/2022   Epigastric pain 08/02/2022   History of duodenal ulcer 06/15/2022  Pseudoarthrosis of thoracic spine after fusion procedure 04/17/2022   Other secondary kyphosis, thoracic region 04/17/2022   Compression fracture of T12 vertebra (HCC) 04/17/2022   Spinal instability, thoracic 04/17/2022   Abdominal wall bulge 01/31/2022   Iron deficiency anemia 01/31/2022   Constipation due to pain medication 12/28/2021   Post-operative pain 12/28/2021   Back pain at L4-L5 level  12/01/2021   Inguinal hernia of right side without obstruction or gangrene 11/15/2021   Umbilical hernia without obstruction or gangrene 11/15/2021   Coronary artery disease involving native coronary artery of native heart 10/07/2021   Other secondary scoliosis, lumbar region 10/04/2021   Postlaminectomy syndrome of lumbar region 10/04/2021   Pedal edema 07/26/2021   Mild pulmonary hypertension (HCC) 07/26/2021   Hyperlipidemia 11/17/2020   S/P TKR (total knee replacement) using cement, left 10/12/2020   Somnolence, daytime 09/09/2020   Encounter for screening mammogram for breast cancer 08/25/2020   Encounter for general adult medical examination with abnormal findings 08/25/2020   GERD (gastroesophageal reflux disease) 08/25/2020   Dizzy spells 08/25/2020   Migraine 03/12/2020   IBS (irritable bowel syndrome) 03/12/2020   S/P TKR (total knee replacement) using cement, right 01/01/2020   Colonic polyp 10/22/2019   History of vaginal hysterectomy 06/11/2019   Estrogen deficiency 04/22/2019   History of skin cancer 04/22/2019   High-tone pelvic floor dysfunction 04/03/2019   IC (interstitial cystitis) 04/03/2019   Neuralgia of both pudendal nerves 04/03/2019   Pelvic pain in female 04/03/2019   Osteoporosis 02/12/2019   Asthma 01/31/2019   Adverse reaction to influenza vaccine, initial encounter 12/03/2018   Vitamin D deficiency 12/03/2018   Restless legs syndrome 11/28/2018   Allergic rhinitis 10/08/2018   Benign essential hypertension 10/08/2018   Chronic bladder pain 10/08/2018   Neuropathy 10/08/2018   Osteoarthritis 10/08/2018   S/P BSO (bilateral salpingo-oophorectomy) 01/07/2018   OSA on CPAP 12/29/2016   Chronic obstructive pulmonary disease (HCC) 12/29/2016   Chronic dyspnea 06/24/2012   Other and unspecified disc disorder of lumbar region 10/25/2000   Diverticulosis of colon 10/25/2000   Prediabetes 09/24/1998   Tinnitus of both ears 09/24/1983   1. OSA on  CPAP Continue excellent compliance  2. CPAP use counseling CPAP couseling-Discussed importance of adequate CPAP use as well as proper care and cleaning techniques of machine and all supplies.  3. Exercise-induced asthma May continue albuterol prn, call office if any new or worsening symptoms. May need to consider maintenance inhaler in future   General Counseling: I have discussed the findings of the evaluation and examination with Erin Petersen.  I have also discussed any further diagnostic evaluation thatmay be needed or ordered today. Erin Petersen verbalizes understanding of the findings of todays visit. We also reviewed her medications today and discussed drug interactions and side effects including but not limited excessive drowsiness and altered mental states. We also discussed that there is always a risk not just to her but also people around her. she has been encouraged to call the office with any questions or concerns that should arise related to todays visit.  No orders of the defined types were placed in this encounter.    Time spent: 30  I have personally obtained a history, examined the patient, evaluated laboratory and imaging results, formulated the assessment and plan and placed orders. This patient was seen by Lynn Ito, PA-C in collaboration with Dr. Freda Munro as a part of collaborative care agreement.     Yevonne Pax, MD [facility] Pulmonary and Critical Care Sleep  medicine

## 2023-02-06 DIAGNOSIS — M4325 Fusion of spine, thoracolumbar region: Secondary | ICD-10-CM | POA: Diagnosis not present

## 2023-02-06 DIAGNOSIS — M960 Pseudarthrosis after fusion or arthrodesis: Secondary | ICD-10-CM | POA: Diagnosis not present

## 2023-02-12 DIAGNOSIS — K080 Exfoliation of teeth due to systemic causes: Secondary | ICD-10-CM | POA: Diagnosis not present

## 2023-02-14 ENCOUNTER — Encounter: Payer: Self-pay | Admitting: Oncology

## 2023-02-15 DIAGNOSIS — G2581 Restless legs syndrome: Secondary | ICD-10-CM | POA: Diagnosis not present

## 2023-02-15 DIAGNOSIS — K219 Gastro-esophageal reflux disease without esophagitis: Secondary | ICD-10-CM | POA: Diagnosis not present

## 2023-02-26 DIAGNOSIS — M25362 Other instability, left knee: Secondary | ICD-10-CM | POA: Diagnosis not present

## 2023-02-26 DIAGNOSIS — M170 Bilateral primary osteoarthritis of knee: Secondary | ICD-10-CM | POA: Diagnosis not present

## 2023-02-26 DIAGNOSIS — M25361 Other instability, right knee: Secondary | ICD-10-CM | POA: Diagnosis not present

## 2023-02-26 DIAGNOSIS — Z96653 Presence of artificial knee joint, bilateral: Secondary | ICD-10-CM | POA: Diagnosis not present

## 2023-02-27 ENCOUNTER — Other Ambulatory Visit: Payer: Self-pay

## 2023-02-27 ENCOUNTER — Other Ambulatory Visit: Payer: Self-pay | Admitting: Family Medicine

## 2023-02-27 MED ORDER — PREGABALIN 150 MG PO CAPS: ORAL_CAPSULE | ORAL | 0 refills | Status: AC

## 2023-02-27 NOTE — Telephone Encounter (Signed)
Last filled on 11/06/22 # 270 caps/ 0 refills   Last OV with PCP was a f/u on 10/24/22

## 2023-02-28 NOTE — Progress Notes (Signed)
 Erin Console, PA-C [address]  [address]  [place], [place] [address]  Main: [phone]  Fax: [phone]   Primary Care Physician: Tower, Laine LABOR, MD  Primary Gastroenterologist:  Erin Console, PA-C / Dr. Corinn Brooklyn    CC: Follow-up duodenal ulcer, constipation, IDA, GERD  HPI: Erin Petersen is a 83 y.o. female, established patient Dr. Brooklyn, returns for follow-up of duodenal ulcer, chronic constipation, GERD and iron  deficiency anemia.  She last saw Dr. Brooklyn for follow-up office visit 01/2022.  She takes MiraLAX  2 capfuls daily for constipation.  Tried Linzess  in the past which was cost prohibitive.  MiraLAX  helps, however she has small thin stools.  Denies rectal bleeding or abdominal pain.  Has history of IBS-C for 40 years.  Takes omeprazole  40 Mg once daily for GERD.  She tried weaning off omeprazole , however had a flareup of severe acid reflux off PPI.  She is requesting to continue omeprazole .  History of duodenal ulcer and she is currently weaning off Celebrex .  Denies any other NSAID use.  EGD 03/2019: One 15 mm duodenal ulcer.  Mild gastritis.  Biopsies negative for H. pylori and dysplasia.  EGD 05/2022: Mild gastritis, otherwise normal.  Previous ulcer healed.  Normal duodenum and esophagus.  Biopsies negative for celiac and H. pylori.  01/05/2023 labs: Normal hemoglobin 14.4.  Normal iron  73, iron  saturation 20%, ferritin 122.  She cannot tolerate oral iron .  She has received IV iron  infusion with benefit.  Has history of adenomatous colon polyps.   Last colonoscopy 10/2018: 4 small (2mm - 5mm) adenomatous polyps removed. 5 year repeat Surveillance colonoscopy will be due 10/2023.  Current Outpatient Medications  Medication Sig Dispense Refill   albuterol  (VENTOLIN  HFA) 108 (90 Base) MCG/ACT inhaler Inhale 2 puffs into the lungs every 6 (six) hours as needed for wheezing or shortness of breath. 8 g 5   amitriptyline  (ELAVIL ) 25 MG tablet TAKE 1 TABLET AT  BEDTIME 90 tablet 1   amLODipine  (NORVASC ) 5 MG tablet Take 1 tablet (5 mg total) by mouth daily. 90 tablet 2   aspirin  81 MG EC tablet Take 81 mg by mouth at bedtime.      celecoxib  (CELEBREX ) 200 MG capsule Take 1 capsule (200 mg total) by mouth daily. 90 capsule 0   Cholecalciferol  (VITAMIN D3) 50 MCG (2000 UT) TABS Take 2,000 Units by mouth daily.     Cyanocobalamin  (VITAMIN B-12) 1000 MCG SUBL Take 2,000 mcg by mouth daily.     EPINEPHrine  0.3 mg/0.3 mL IJ SOAJ injection Inject 0.3 mg into the muscle as needed for anaphylaxis. 2 each 0   fluticasone  (FLONASE ) 50 MCG/ACT nasal spray Place 2 sprays into both nostrils daily. qhs     hydrochlorothiazide  (HYDRODIURIL ) 25 MG tablet TAKE 1 TABLET DAILY 90 tablet 3   Melatonin 10 MG CAPS Take 1 capsule by mouth as needed.     methocarbamol  (ROBAXIN ) 500 MG tablet Take 1 tablet (500 mg total) by mouth every 6 (six) hours as needed for muscle spasms. 120 tablet 0   mupirocin  ointment (BACTROBAN ) 2 % Apply topically 2 (two) times daily. 22 g 0   ondansetron  (ZOFRAN -ODT) 4 MG disintegrating tablet Take 1 tablet (4 mg total) by mouth every 8 (eight) hours as needed for nausea or vomiting. 270 tablet 0   pregabalin  (LYRICA ) 150 MG capsule Take one pill by mouth in the am and 2 in the pm 270 capsule 0   rOPINIRole  (REQUIP ) 1 MG  tablet Take 1 mg by mouth in the morning and at bedtime. 1 in am and 1 at 3pm     rOPINIRole  (REQUIP ) 4 MG tablet TAKE 1 TABLET AT BEDTIME 90 tablet 3   simvastatin  (ZOCOR ) 20 MG tablet TAKE 1 TABLET AT BEDTIME 90 tablet 3   tiZANidine  (ZANAFLEX ) 4 MG tablet Take 4 mg by mouth 3 (three) times daily.     traMADol  (ULTRAM ) 50 MG tablet Take 1 tablet (50 mg total) by mouth every 12 (twelve) hours as needed for severe pain. 15 tablet 0   omeprazole  (PRILOSEC) 40 MG capsule Take 1 capsule (40 mg total) by mouth daily. 90 capsule 3   polyethylene glycol (MIRALAX  / GLYCOLAX ) 17 g packet Take 17 g by mouth daily.     No current  facility-administered medications for this visit.    Allergies as of 03/01/2023 - Review Complete 03/01/2023  Allergen Reaction Noted   Bee venom Anaphylaxis and Hives 12/19/2009   Fosamax  [alendronate ] Nausea Only 03/13/2020   Fusion plus [iron -fa-b cmp-c-biot-probiotic] Nausea Only 04/06/2022   Ciprofloxacin hcl Rash 12/29/2016   Sulfa antibiotics Rash 06/10/2012    Past Medical History:  Diagnosis Date   Anemia    Arthritis    Asthma    Bacteremia due to Gram-negative bacteria 06/04/2019   CAD (coronary artery disease) 07/21/2016   a.) LHC 07/21/2016: mild nonobstructive CAD   Colon polyps    Complication of anesthesia    a.) anesthesia awareness   Compression fracture of L1 lumbar vertebra (HCC)    COPD (chronic obstructive pulmonary disease) (HCC)    Diastolic dysfunction    a.) TTE 03/02/2021: EF >55%, mild LVH, mild LAE, triv MR, mild TR/PR, G1DD   Duodenal ulcer    Dyspnea    GERD (gastroesophageal reflux disease)    History of 2019 novel coronavirus disease (COVID-19) 01/19/2021   a.) tested (+) [facility]   History of lumbar fusion    a.) T12-S1   Hyperlipidemia    Hypertension    Insomnia    a.) takes melatonin PRN   OSA on CPAP    Osteopenia    Peripheral neuropathy    Plantar fasciitis    Pneumonia    Pulmonary hypertension (HCC)    a.) TTE 06/04/2019: moderate (PASP 40.3 mmHg)   Right inguinal hernia    a.) s/p repair 12/2016   RLS (restless legs syndrome)    a.) on ropinirole    SOB (shortness of breath)    T12 compression fracture (HCC)    Umbilical hernia    a.) s/p repair 12/2016   Urinary incontinence    Urinary tract infection    Wears hearing aid in both ears     Past Surgical History:  Procedure Laterality Date   ABDOMINAL HYSTERECTOMY  1972   APPENDECTOMY  1958   APPLICATION OF INTRAOPERATIVE CT SCAN N/A 04/17/2022   Procedure: APPLICATION OF INTRAOPERATIVE CT SCAN;  Surgeon: Clois Fret, MD;  Location: [facility] OR[facility];  Service:  Neurosurgery;  Laterality: N/A;   BILATERAL SALPINGOOPHORECTOMY  01/07/2018   BIOPSY  04/15/2019   Procedure: BIOPSY;  Surgeon: Unk Corinn Skiff, MD;  Location[facility];  Service: Endoscopy;;   CARDIAC CATHETERIZATION Left 07/21/2016   COLONOSCOPY WITH PROPOFOL  N/A 11/11/2018   Procedure: COLONOSCOPY WITH PROPOFOL ;  Surgeon: Unk Corinn Skiff, MD;  Location: [facility] END[facility];  Service: Gastroenterology;  Laterality: N/A;   CYSTO WITH HYDRODISTENSION N/A 12/30/2018   Procedure: CYSTOSCOPY/HYDRODISTENSION WITH MARCAINE ;  Surgeon: Gaston Hamilton, MD;  Location: [facility];  Service: Urology;  Laterality: N/A;   ESOPHAGOGASTRODUODENOSCOPY (EGD) WITH PROPOFOL  N/A 04/15/2019   Procedure: ESOPHAGOGASTRODUODENOSCOPY (EGD) WITH PROPOFOL ;  Surgeon: Unk Corinn Skiff, MD;  Location: [facility];  Service: Endoscopy;  Laterality: N/A;  sleep apnea   ESOPHAGOGASTRODUODENOSCOPY (EGD) WITH PROPOFOL  N/A 07/09/2019   Procedure: ESOPHAGOGASTRODUODENOSCOPY (EGD) WITH PROPOFOL ;  Surgeon: Unk Corinn Skiff, MD;  Location: [facility] ENDOS[facility];  Service: Gastroenterology;  Laterality: N/A;   ESOPHAGOGASTRODUODENOSCOPY (EGD) WITH PROPOFOL  N/A 06/15/2022   Procedure: ESOPHAGOGASTRODUODENOSCOPY (EGD) WITH PROPOFOL ;  Surgeon: Unk Corinn Skiff, MD;  Location: [facility];  Service: Endoscopy;  Laterality: N/A;  sleep apnea   EYE SURGERY Bilateral    INGUINAL HERNIA REPAIR Bilateral 01/05/2017   JOINT REPLACEMENT     LAPAROSCOPIC BILATERAL SALPINGO OOPHERECTOMY  2019   LUMBAR FUSION  2002   LUMBAR FUSION  11/14/2021   Dr Royden Schneider   LUMBAR FUSION  12/14/2021   Dr Donnajean Eddy   SPINAL FUSION  2002   TONSILLECTOMY AND ADENOIDECTOMY  1961   TOTAL KNEE ARTHROPLASTY Right 01/01/2020   Procedure: Right total knee arthroplasty - Medford Amber to Assist;  Surgeon: Kathlynn Sharper, MD;  Location: [facility];  Service: Orthopedics;  Laterality: Right;   TOTAL KNEE ARTHROPLASTY Left 10/12/2020    Procedure: TOTAL KNEE ARTHROPLASTY;  Surgeon: Kathlynn Sharper, MD;  Location: [facility];  Service: Orthopedics;  Laterality: Left;   TUBAL LIGATION     UMBILICAL HERNIA REPAIR N/A 01/05/2017    Review of Systems:    All systems reviewed and negative except where noted in HPI.   Physical Examination:   BP (!) 140/65   Pulse 80   Temp 98.2 F (36.8 C)   Ht 4' 11 (1.499 m)   Wt 178 lb (80.7 kg)   LMP  (LMP Unknown)   BMI 35.95 kg/m   General: Well-nourished, well-developed in no acute distress.  Lungs: Clear to auscultation bilaterally. Non-labored. Heart: Regular rate and rhythm, no murmurs rubs or gallops.  Abdomen: Bowel sounds are normal; Abdomen is Soft; No hepatosplenomegaly, masses or hernias;  No Abdominal Tenderness; No guarding or rebound tenderness. Neuro: Alert and oriented x 3.  Grossly intact.  Psych: Alert and cooperative, normal mood and affect.   Imaging Studies: No results found.  Assessment and Plan:   MOESHA SARCHET is a 83 y.o. y/o female returns for f/u:  Chronic GERD without esophagitis.  Continue Rx Omeprazole  40mg  once daily, #90, 3 RF.  We discussed adverse side effects of PPIs to include vitamin deficiencies, osteoporosis, renal insufficiency, dementia and increased risk of C. Difficile.  Recommend take lowest effective dose of PPI necessary to control acid reflux.  OK to add H2RB (Pepcid 20mg  daily) or antiacid if needed for breakthrough acid reflux.   Chronic Idiopathic Constipation Continue MiraLAX  2 capfuls in a drink once daily. Add OTC Metamucil or Benefiber daily. Recommend high-fiber diet, 30 g of fiber daily and drink 64 ounces of water  fluids daily.  IBS-C for 40 years  Hx Duodenal Ulcer - Resolved / healed on EGD done 05/2022.  Patient is instructed to avoid all NSAIDs.  She is in process of weaning off Celebrex  per Ortho.  5.   Hx Iron  Deficiency Anemia - Resolved post IV Iron  infusion.  Continue monitoring labs through PCP.  No  iron  is needed at this time.  6.  History of adenomatous colon polyps  5-year repeat colonoscopy will be due 10/2023, pending health status.   Erin Console,  PA-C  Follow up as needed based on GI symptoms.

## 2023-03-01 ENCOUNTER — Encounter: Payer: Self-pay | Admitting: Family Medicine

## 2023-03-01 ENCOUNTER — Ambulatory Visit: Payer: Medicare Other | Admitting: Physician Assistant

## 2023-03-01 ENCOUNTER — Encounter: Payer: Self-pay | Admitting: Physician Assistant

## 2023-03-01 VITALS — BP 140/65 | HR 80 | Temp 98.2°F | Ht 59.0 in | Wt 178.0 lb

## 2023-03-01 DIAGNOSIS — K219 Gastro-esophageal reflux disease without esophagitis: Secondary | ICD-10-CM

## 2023-03-01 DIAGNOSIS — K581 Irritable bowel syndrome with constipation: Secondary | ICD-10-CM | POA: Diagnosis not present

## 2023-03-01 DIAGNOSIS — K5909 Other constipation: Secondary | ICD-10-CM

## 2023-03-01 DIAGNOSIS — Z860101 Personal history of adenomatous and serrated colon polyps: Secondary | ICD-10-CM

## 2023-03-01 DIAGNOSIS — K5904 Chronic idiopathic constipation: Secondary | ICD-10-CM | POA: Diagnosis not present

## 2023-03-01 MED ORDER — OMEPRAZOLE 40 MG PO CPDR: 40.0000 mg | DELAYED_RELEASE_CAPSULE | Freq: Every day | ORAL | 3 refills | Status: DC

## 2023-03-01 MED ORDER — POLYETHYLENE GLYCOL 3350 17 G PO PACK: 1.0000 | PACK | Freq: Every day | ORAL | Status: AC

## 2023-03-01 NOTE — Patient Instructions (Signed)
   Start OTC Benefiber Powder. Mix 1 - 2 Tablespoons in 6 - 8 ounces of a Drink Once Daily. Drink 64 ounces of water / fluids Daily.

## 2023-03-15 ENCOUNTER — Ambulatory Visit: Payer: Medicare Other | Admitting: Neurosurgery

## 2023-03-20 ENCOUNTER — Other Ambulatory Visit: Payer: Self-pay

## 2023-03-20 ENCOUNTER — Encounter: Payer: Self-pay | Admitting: Neurosurgery

## 2023-03-20 ENCOUNTER — Encounter: Payer: Self-pay | Admitting: Family Medicine

## 2023-03-20 MED ORDER — FLUTICASONE PROPIONATE 50 MCG/ACT NA SUSP: 2.0000 | Freq: Every day | NASAL | 0 refills | Status: AC

## 2023-03-21 ENCOUNTER — Ambulatory Visit
Admission: RE | Admit: 2023-03-21 | Discharge: 2023-03-21 | Disposition: A | Discharge status: Home / Self Care | Payer: Medicare Other | Source: Ambulatory Visit | Attending: Neurosurgery | Admitting: Neurosurgery

## 2023-03-21 ENCOUNTER — Ambulatory Visit
Admission: RE | Admit: 2023-03-21 | Discharge: 2023-03-21 | Disposition: A | Discharge status: Home / Self Care | Payer: Medicare Other | Attending: Neurosurgery | Admitting: Neurosurgery

## 2023-03-21 DIAGNOSIS — Z981 Arthrodesis status: Secondary | ICD-10-CM | POA: Diagnosis not present

## 2023-03-21 DIAGNOSIS — M4855XD Collapsed vertebra, not elsewhere classified, thoracolumbar region, subsequent encounter for fracture with routine healing: Secondary | ICD-10-CM | POA: Diagnosis not present

## 2023-03-21 DIAGNOSIS — M419 Scoliosis, unspecified: Secondary | ICD-10-CM

## 2023-03-21 DIAGNOSIS — M438X4 Other specified deforming dorsopathies, thoracic region: Secondary | ICD-10-CM | POA: Diagnosis not present

## 2023-03-21 DIAGNOSIS — S22089A Unspecified fracture of T11-T12 vertebra, initial encounter for closed fracture: Secondary | ICD-10-CM | POA: Diagnosis not present

## 2023-03-21 DIAGNOSIS — M546 Pain in thoracic spine: Secondary | ICD-10-CM | POA: Diagnosis not present

## 2023-03-21 DIAGNOSIS — I700 Atherosclerosis of aorta: Secondary | ICD-10-CM | POA: Diagnosis not present

## 2023-03-22 ENCOUNTER — Encounter: Payer: Self-pay | Admitting: Neurosurgery

## 2023-03-22 ENCOUNTER — Ambulatory Visit: Payer: Medicare Other | Admitting: Neurosurgery

## 2023-03-22 DIAGNOSIS — M25562 Pain in left knee: Secondary | ICD-10-CM

## 2023-03-22 DIAGNOSIS — M419 Scoliosis, unspecified: Secondary | ICD-10-CM

## 2023-03-22 DIAGNOSIS — M25561 Pain in right knee: Secondary | ICD-10-CM | POA: Diagnosis not present

## 2023-03-22 DIAGNOSIS — M4325 Fusion of spine, thoracolumbar region: Secondary | ICD-10-CM

## 2023-03-22 MED ORDER — CELECOXIB 200 MG PO CAPS: 200.0000 mg | ORAL_CAPSULE | Freq: Every day | ORAL | 3 refills | Status: DC

## 2023-03-22 MED ORDER — METHOCARBAMOL 500 MG PO TABS: 500.0000 mg | ORAL_TABLET | Freq: Four times a day (QID) | ORAL | 0 refills | Status: DC | PRN

## 2023-03-22 NOTE — Progress Notes (Signed)
   REFERRING PHYSICIAN:  Judy Pimple, Md [address] [place],  [place] [address]  DOS: T10- pelvis fusion. T12-L1 posterior column osteotomy 04/17/22  HISTORY OF PRESENT ILLNESS: Erin Petersen is status post thoracopelvic fusion.    She continues to slowly improve.  She does have pain that bothers her in her lower back with some activities.  Her pain helpers to 23 and 7 out of 10.   PHYSICAL EXAMINATION:  General: Patient is well developed, well nourished, calm, collected, and in no apparent distress.   NEUROLOGICAL:  General: In no acute distress.   Awake, alert, oriented to person, place, and time.  Pupils equal round and reactive to light.  Facial tone is symmetric.  Tongue protrusion is midline.  There is no pronator drift.   Strength:            Side Iliopsoas Quads Hamstring PF DF EHL  R 5 5 5 5 5 5   L 5 5 5 5 5 5    Incision c/d/I and healing well   ROS (Neurologic):  Negative except as noted above  IMAGING: No complications noted on x-rays from August 2024 or Jan 2025  ASSESSMENT/PLAN:  Erin Petersen is doing better after spinal fusion.  She continues to slowly improve.  She is having some bilateral knee pain.  She is in braces for this.  She asked me about basivertebral nerve ablation.  I will contact my colleague in pain management to see if he performs this service.  I think her other option is spinal cord stimulation, but that would require a percutaneous placement which may be difficult with how much hardware she has in place.  I do not think it is a good idea to perform an open placement right above her T10 to the pelvis.    I will see her back as needed.    I have refilled her meds.  I think it is appropriate for her to continue Celebrex and methocarbamol as needed for now.  Venetia Night MD Department of neurosurgery

## 2023-04-02 ENCOUNTER — Ambulatory Visit (INDEPENDENT_AMBULATORY_CARE_PROVIDER_SITE_OTHER): Payer: Medicare Other | Admitting: Family Medicine

## 2023-04-02 ENCOUNTER — Encounter: Payer: Self-pay | Admitting: Neurosurgery

## 2023-04-02 ENCOUNTER — Encounter: Payer: Self-pay | Admitting: Family Medicine

## 2023-04-02 ENCOUNTER — Ambulatory Visit (INDEPENDENT_AMBULATORY_CARE_PROVIDER_SITE_OTHER)
Admission: RE | Admit: 2023-04-02 | Discharge: 2023-04-02 | Disposition: A | Discharge status: Home / Self Care | Payer: Medicare Other | Source: Ambulatory Visit | Attending: Family Medicine | Admitting: Family Medicine

## 2023-04-02 VITALS — BP 131/70 | HR 64 | Temp 97.3°F | Ht <= 58 in | Wt 176.1 lb

## 2023-04-02 DIAGNOSIS — S99911A Unspecified injury of right ankle, initial encounter: Secondary | ICD-10-CM | POA: Diagnosis not present

## 2023-04-02 DIAGNOSIS — M25571 Pain in right ankle and joints of right foot: Secondary | ICD-10-CM

## 2023-04-02 DIAGNOSIS — M25471 Effusion, right ankle: Secondary | ICD-10-CM | POA: Diagnosis not present

## 2023-04-02 DIAGNOSIS — M79671 Pain in right foot: Secondary | ICD-10-CM | POA: Diagnosis not present

## 2023-04-02 DIAGNOSIS — S99921A Unspecified injury of right foot, initial encounter: Secondary | ICD-10-CM | POA: Diagnosis not present

## 2023-04-02 NOTE — Patient Instructions (Addendum)
Elevate foot /ankle when you sit   Use cold compress when you can  You can try an ace wrap or ankle compression to see if this helps   Let's get an xray today  We will contact you with result and plan when it is read

## 2023-04-02 NOTE — Assessment & Plan Note (Signed)
After fall 6 weeks ago  Worse initially  Pain with weight bearing , swelling at end of day  Also has neuropathy of feet  Some lateral ankle tenderness on exam with normal rom  No acute fracture or dislocation on imaging  Takes celebrex Encouraged use of ice/cold compress and can try compression ankle sleeve if helpful  Consider sport med visit if symptoms persist

## 2023-04-02 NOTE — Progress Notes (Signed)
Subjective:    Patient ID: Erin Petersen, female    DOB: [birth year], 83 y.o.   MRN: [id number]  HPI  Wt Readings from Last 3 Encounters:  04/02/23 176 lb 2 oz (79.9 kg)  03/22/23 175 lb 12.8 oz (79.7 kg)  03/01/23 178 lb (80.7 kg)   38.11 kg/m  Vitals:   04/02/23 1028 04/02/23 1050  BP: (!) 148/80 131/70  Pulse: 64   Temp: (!) 97.3 F (36.3 C)   SpO2: 98%      Pt presents for right ankle pain after injury  Happened about 6 weeks ago   Larey Seat and twisted her right knee and foot    Foot got very swollen and bruised Hard to wear a shoe for 2 weeks  Now a lot of pain with standing still / a little less with walking   Is lateral to anterior ankle / proximal foot  Is dull and achey    In evening right ankle is swollen    Both of her feet tend to burn especially at night /neuropathy  Does have history of foot fracture in past - many years ago- ? This foot     Saw Dr Rosita Kea for knee and did not discuss foot Is hyperextending knee  Has had knee replacement   Celebrex is on med list  Lab Results  Component Value Date   NA 137 10/24/2022   K 3.5 10/24/2022   CO2 32 10/24/2022   GLUCOSE 114 (H) 10/24/2022   BUN 15 10/24/2022   CREATININE 0.74 10/24/2022   CALCIUM 9.4 10/24/2022   GFR 76.06 10/24/2022   GFRNONAA >60 07/26/2022   Imaging today  IMPRESSION: 1. No acute fracture. 2. Mild hallux valgus. 3. Mild second tarsometatarsal osteoarthritis.   Patient Active Problem List   Diagnosis Date Noted   Pain and swelling of right ankle 04/02/2023   Laceration of left hand 11/09/2022   Headache 08/02/2022   Epigastric pain 08/02/2022   History of duodenal ulcer 06/15/2022   Pseudoarthrosis of thoracic spine after fusion procedure 04/17/2022   Other secondary kyphosis, thoracic region 04/17/2022   Compression fracture of T12 vertebra (HCC) 04/17/2022   Spinal instability, thoracic 04/17/2022   Abdominal wall bulge 01/31/2022   Iron deficiency anemia  01/31/2022   Constipation due to pain medication 12/28/2021   Post-operative pain 12/28/2021   Back pain at L4-L5 level 12/01/2021   Inguinal hernia of right side without obstruction or gangrene 11/15/2021   Umbilical hernia without obstruction or gangrene 11/15/2021   Other secondary scoliosis, lumbar region 10/04/2021   Postlaminectomy syndrome of lumbar region 10/04/2021   Pedal edema 07/26/2021   Mild pulmonary hypertension (HCC) 07/26/2021   Hyperlipidemia 11/17/2020   S/P TKR (total knee replacement) using cement, left 10/12/2020   Somnolence, daytime 09/09/2020   Encounter for screening mammogram for breast cancer 08/25/2020   Encounter for general adult medical examination with abnormal findings 08/25/2020   GERD (gastroesophageal reflux disease) 08/25/2020   Dizzy spells 08/25/2020   Migraine 03/12/2020   IBS (irritable bowel syndrome) 03/12/2020   S/P TKR (total knee replacement) using cement, right 01/01/2020   Colonic polyp 10/22/2019   History of vaginal hysterectomy 06/11/2019   Estrogen deficiency 04/22/2019   History of skin cancer 04/22/2019   High-tone pelvic floor dysfunction 04/03/2019   IC (interstitial cystitis) 04/03/2019   Neuralgia of both pudendal nerves 04/03/2019   Pelvic pain in female 04/03/2019   Osteoporosis 02/12/2019   Asthma 01/31/2019   Adverse reaction  to influenza vaccine, initial encounter 12/03/2018   Vitamin D deficiency 12/03/2018   Restless legs syndrome 11/28/2018   Allergic rhinitis 10/08/2018   Benign essential hypertension 10/08/2018   Chronic bladder pain 10/08/2018   Neuropathy 10/08/2018   Osteoarthritis 10/08/2018   S/P BSO (bilateral salpingo-oophorectomy) 01/07/2018   OSA on CPAP 12/29/2016   Chronic obstructive pulmonary disease (HCC) 12/29/2016   Chronic dyspnea 06/24/2012   Other and unspecified disc disorder of lumbar region 10/25/2000   Diverticulosis of colon 10/25/2000   Prediabetes 09/24/1998   Tinnitus of both  ears 09/24/1983   Past Medical History:  Diagnosis Date   Anemia    Arthritis    Asthma    Bacteremia due to Gram-negative bacteria 06/04/2019   CAD (coronary artery disease) 07/21/2016   a.) LHC 07/21/2016: mild nonobstructive CAD   Colon polyps    Complication of anesthesia    a.) anesthesia awareness   Compression fracture of L1 lumbar vertebra (HCC)    COPD (chronic obstructive pulmonary disease) (HCC)    Diastolic dysfunction    a.) TTE 03/02/2021: EF >55%, mild LVH, mild LAE, triv MR, mild TR/PR, G1DD   Duodenal ulcer    Dyspnea    GERD (gastroesophageal reflux disease)    History of 2019 novel coronavirus disease (COVID-19) 01/19/2021   a.) tested (+) at [facility]   History of lumbar fusion    a.) T12-S1   Hyperlipidemia    Hypertension    Insomnia    a.) takes melatonin PRN   OSA on CPAP    Osteopenia    Peripheral neuropathy    Plantar fasciitis    Pneumonia    Pulmonary hypertension (HCC)    a.) TTE 06/04/2019: moderate (PASP 40.3 mmHg)   Right inguinal hernia    a.) s/p repair 12/2016   RLS (restless legs syndrome)    a.) on ropinirole   SOB (shortness of breath)    T12 compression fracture (HCC)    Umbilical hernia    a.) s/p repair 12/2016   Urinary incontinence    Urinary tract infection    Wears hearing aid in both ears    Past Surgical History:  Procedure Laterality Date   ABDOMINAL HYSTERECTOMY  1972   APPENDECTOMY  1958   APPLICATION OF INTRAOPERATIVE CT SCAN N/A 04/17/2022   Procedure: APPLICATION OF INTRAOPERATIVE CT SCAN;  Surgeon: Venetia Night, MD;  Location: [facility];  Service: Neurosurgery;  Laterality: N/A;   BILATERAL SALPINGOOPHORECTOMY  01/07/2018   BIOPSY  04/15/2019   Procedure: BIOPSY;  Surgeon: Toney Reil, MD;  Location: [facility];  Service: Endoscopy;;   CARDIAC CATHETERIZATION Left 07/21/2016   COLONOSCOPY WITH PROPOFOL N/A 11/11/2018   Procedure: COLONOSCOPY WITH PROPOFOL;  Surgeon: Toney Reil, MD;  Location: [facility];  Service: Gastroenterology;  Laterality: N/A;   CYSTO WITH HYDRODISTENSION N/A 12/30/2018   Procedure: CYSTOSCOPY/HYDRODISTENSION WITH MARCAINE;  Surgeon: Alfredo Martinez, MD;  Location: [facility];  Service: Urology;  Laterality: N/A;   ESOPHAGOGASTRODUODENOSCOPY (EGD) WITH PROPOFOL N/A 04/15/2019   Procedure: ESOPHAGOGASTRODUODENOSCOPY (EGD) WITH PROPOFOL;  Surgeon: Toney Reil, MD;  Location: [facility];  Service: Endoscopy;  Laterality: N/A;  sleep apnea   ESOPHAGOGASTRODUODENOSCOPY (EGD) WITH PROPOFOL N/A 07/09/2019   Procedure: ESOPHAGOGASTRODUODENOSCOPY (EGD) WITH PROPOFOL;  Surgeon: Toney Reil, MD;  Location: [facility] END[facility];  Service: Gastroenterology;  Laterality: N/A;   ESOPHAGOGASTRODUODENOSCOPY (EGD) WITH PROPOFOL N/A 06/15/2022   Procedure: ESOPHAGOGASTRODUODENOSCOPY (EGD) WITH PROPOFOL;  Surgeon: Toney Reil, MD;  Location: [facility];  Service: Endoscopy;  Laterality: N/A;  sleep apnea   EYE SURGERY Bilateral    INGUINAL HERNIA REPAIR Bilateral 01/05/2017   JOINT REPLACEMENT     LAPAROSCOPIC BILATERAL SALPINGO OOPHERECTOMY  2019   LUMBAR FUSION  2002   LUMBAR FUSION  11/14/2021   Dr Sharolyn Douglas   LUMBAR FUSION  12/14/2021   Dr Malachy Chamber   SPINAL FUSION  2002   TONSILLECTOMY AND ADENOIDECTOMY  1961   TOTAL KNEE ARTHROPLASTY Right 01/01/2020   Procedure: Right total knee arthroplasty - [place] Neighbor to Assist;  Surgeon: Kennedy Bucker, MD;  Location: [facility];  Service: Orthopedics;  Laterality: Right;   TOTAL KNEE ARTHROPLASTY Left 10/12/2020   Procedure: TOTAL KNEE ARTHROPLASTY;  Surgeon: Kennedy Bucker, MD;  Location: [facility];  Service: Orthopedics;  Laterality: Left;   TUBAL LIGATION     UMBILICAL HERNIA REPAIR N/A 01/05/2017   Social History   Tobacco Use   Smoking status: Never   Smokeless tobacco: Never  Vaping Use   Vaping status: Never Used  Substance Use Topics   Alcohol use:  Not Currently   Drug use: Never   Family History  Problem Relation Age of Onset   Alcohol abuse Mother    Cancer Mother    Stroke Mother    Alcohol abuse Father    Arthritis Sister    Arthritis Daughter    COPD Daughter    Arthritis Paternal Grandmother    Asthma Paternal Grandmother    COPD Paternal Grandmother    Breast cancer Paternal Aunt    Allergies  Allergen Reactions   Bee Venom Anaphylaxis and Hives   Fosamax [Alendronate] Nausea Only    Stomach pain   Fusion Plus [Iron-Fa-B Cmp-C-Biot-Probiotic] Nausea Only    Stomach pain   Ciprofloxacin Hcl Rash    Mild rash after long term use   Sulfa Antibiotics Rash    Bactrim   Current Outpatient Medications on File Prior to Visit  Medication Sig Dispense Refill   albuterol (VENTOLIN HFA) 108 (90 Base) MCG/ACT inhaler Inhale 2 puffs into the lungs every 6 (six) hours as needed for wheezing or shortness of breath. 8 g 5   amitriptyline (ELAVIL) 25 MG tablet TAKE 1 TABLET AT BEDTIME 90 tablet 1   amLODipine (NORVASC) 5 MG tablet Take 1 tablet (5 mg total) by mouth daily. 90 tablet 2   aspirin 81 MG EC tablet Take 81 mg by mouth at bedtime.      celecoxib (CELEBREX) 200 MG capsule Take 1 capsule (200 mg total) by mouth daily. 90 capsule 3   Cholecalciferol (VITAMIN D3) 50 MCG (2000 UT) TABS Take 2,000 Units by mouth daily.     Cyanocobalamin (VITAMIN B-12) 1000 MCG SUBL Take 2,000 mcg by mouth daily.     EPINEPHrine 0.3 mg/0.3 mL IJ SOAJ injection Inject 0.3 mg into the muscle as needed for anaphylaxis. 2 each 0   fluticasone (FLONASE) 50 MCG/ACT nasal spray Place 2 sprays into both nostrils daily. qhs 48 g 0   hydrochlorothiazide (HYDRODIURIL) 25 MG tablet TAKE 1 TABLET DAILY 90 tablet 3   Melatonin 10 MG CAPS Take 1 capsule by mouth as needed.     methocarbamol (ROBAXIN) 500 MG tablet Take 1 tablet (500 mg total) by mouth every 6 (six) hours as needed for muscle spasms. 180 tablet 0   mupirocin ointment (BACTROBAN) 2 % Apply  topically 2 (two) times daily. 22 g 0   omeprazole (PRILOSEC) 40  MG capsule Take 1 capsule (40 mg total) by mouth daily. 90 capsule 3   ondansetron (ZOFRAN-ODT) 4 MG disintegrating tablet Take 1 tablet (4 mg total) by mouth every 8 (eight) hours as needed for nausea or vomiting. 270 tablet 0   polyethylene glycol (MIRALAX / GLYCOLAX) 17 g packet Take 17 g by mouth daily.     pregabalin (LYRICA) 150 MG capsule Take one pill by mouth in the am and 2 in the pm 270 capsule 0   rOPINIRole (REQUIP) 1 MG tablet Take 1 mg by mouth in the morning and at bedtime. 1 in am and 1 at 3pm     rOPINIRole (REQUIP) 4 MG tablet TAKE 1 TABLET AT BEDTIME 90 tablet 3   simvastatin (ZOCOR) 20 MG tablet TAKE 1 TABLET AT BEDTIME 90 tablet 3   tiZANidine (ZANAFLEX) 4 MG tablet Take 4 mg by mouth 3 (three) times daily.     traMADol (ULTRAM) 50 MG tablet Take 1 tablet (50 mg total) by mouth every 12 (twelve) hours as needed for severe pain. 15 tablet 0   No current facility-administered medications on file prior to visit.    Review of Systems  Constitutional:  Negative for activity change, appetite change, fatigue, fever and unexpected weight change.  HENT:  Negative for congestion, ear pain, rhinorrhea, sinus pressure and sore throat.   Eyes:  Negative for pain, redness and visual disturbance.  Respiratory:  Negative for cough, shortness of breath and wheezing.   Cardiovascular:  Negative for chest pain and palpitations.  Gastrointestinal:  Negative for abdominal pain, blood in stool, constipation and diarrhea.  Endocrine: Negative for polydipsia and polyuria.  Genitourinary:  Negative for dysuria, frequency and urgency.  Musculoskeletal:  Positive for arthralgias and back pain. Negative for myalgias.  Skin:  Negative for pallor and rash.  Allergic/Immunologic: Negative for environmental allergies.  Neurological:  Negative for dizziness, syncope and headaches.  Hematological:  Negative for adenopathy. Does not  bruise/bleed easily.  Psychiatric/Behavioral:  Negative for decreased concentration and dysphoric mood. The patient is not nervous/anxious.        Objective:   Physical Exam Constitutional:      Appearance: Normal appearance. She is obese.  HENT:     Head: Normocephalic and atraumatic.  Eyes:     Conjunctiva/sclera: Conjunctivae normal.     Pupils: Pupils are equal, round, and reactive to light.  Cardiovascular:     Rate and Rhythm: Normal rate and regular rhythm.  Pulmonary:     Effort: No respiratory distress.     Breath sounds: Normal breath sounds. No wheezing.  Musculoskeletal:     Right ankle: No swelling or deformity. Tenderness present over the lateral malleolus, ATF ligament, posterior TF ligament and base of 5th metatarsal. Normal range of motion. Anterior drawer test negative. Normal pulse.     Right Achilles Tendon: Normal. No tenderness or defects. Thompson's test negative.     Right foot: Normal range of motion. No prominent metatarsal heads, tenderness, bony tenderness or crepitus.     Comments: Some tenderness around malleolus of right ankle  Normal rom  Not unstable No skin changes  No crepitus    Neurological:     Mental Status: She is alert.           Assessment & Plan:   Problem List Items Addressed This Visit       Other   Pain and swelling of right ankle - Primary   After fall 6 weeks ago  Worse initially  Pain with weight bearing , swelling at end of day  Also has neuropathy of feet  Some lateral ankle tenderness on exam with normal rom  No acute fracture or dislocation on imaging  Takes celebrex Encouraged use of ice/cold compress and can try compression ankle sleeve if helpful  Consider sport med visit if symptoms persist        Relevant Orders   DG Ankle Complete Right (Completed)   DG Foot Complete Right (Completed)

## 2023-04-08 NOTE — Progress Notes (Addendum)
 Erin Petersen T. Nafeesah Lapaglia, MD, CAQ Sports Medicine [facility] at [facility] [address] [place], [address]  Phone: [phone]  FAX: [phone]  Erin Petersen - 83 y.o. female  MRN [id number]  Date of Birth: February 02, 1941  Date: 04/09/2023  PCP: Judy Pimple, MD  Referral: Judy Pimple, MD  Chief Complaint  Patient presents with   Foot Pain    Right x 2 month Fell 2 months ago   Subjective:   Erin Petersen is a 83 y.o. very pleasant female patient with Body mass index is 38.19 kg/m. who presents with the following:  Patient is here for evaluation of right-sided ankle pain.  She actually saw my partner Dr. Milinda Antis last week who recommended that she follow-up with me today.  Initially, almost 2 months ago, the patient had an inversion injury on the right side.  She developed some extensive swelling and bruising, and the bruising went away in roughly 3 weeks.  She also had plain x-rays of the right foot and ankle last week, and they are essentially normal with some evidence of osteoarthritic change.  Roughly 2 months ago, she fell and she landed on her right side.  At that point, she also injured and sprained her knee.  She previously saw my commands at [facility] clinic, and he felt as if she had some knee sprain as well as some arthritic changes of both knees.  Right now, she is having some severe pain in the right foot and ankle.  Recently, she tried to drive to [facility], and she had such severe pain after this that she was essentially unable to walk.  She has diffuse pain in almost all aspects and around the ankle.  She has trouble standing.  At the end of the day, it really is hurting a lot.  When goes to bed at night, it will hurt.   3 recent spine surgeries, 2 initially from spine and scoliosis in [place], and Dr. Marcell Barlow did her third successful surgery.  R ankle hurts everywhere Lateral Medial  Calcaneous  R Achilles Peronal Tibilais ATFL Calc F lig    Review of Systems is noted in the HPI, as appropriate  Objective:   BP (!) 140/68 (BP Location: Left Arm, Patient Position: Sitting, Cuff Size: Large)   Pulse 74   Temp 98.5 F (36.9 C) (Temporal)   Ht 4\' 9"  (1.448 m)   Wt 176 lb 8 oz (80.1 kg)   LMP  (LMP Unknown)   SpO2 97%   BMI 38.19 kg/m   GEN: No acute distress; alert,appropriate. PULM: Breathing comfortably in no respiratory distress PSYCH: Normally interactive.   On the left side the patient has no tenderness along the tibia and fibula.  She has some mild tenderness at the calcaneus medially and laterally.  She also has some pain about the posterior tibialis tendon and peroneal tendons. No tenderness at the Achilles, plantar fascia, calcaneus, talus, navicular, the remainder of the midfoot, or forefoot. She does have some mild restriction in range of motion at the first MTP on the left.  The right area is the place of maximal tenderness and pain. No significant tenderness along the tibia and fibula, but she does have notable bony tenderness at the medial and lateral malleolus She has tenderness at the ATFL and CFL ligaments Tenderness at the talus Tenderness along the posterior tibialis and peroneal tendons Tenderness at the calcaneus medially and laterally Notable tenderness at the Achilles Nontender  at the plantar fascia The remainder of the midfoot is nontender She has some mild restriction of motion at the first MTP joint, however she is nontender in all metatarsals and MTP joints.  Laboratory and Imaging Data: DG Ankle Complete Right Result Date: 04/02/2023 CLINICAL DATA:  Lateral ankle and foot pain since injury 6 days ago. Swelling. EXAM: RIGHT FOOT COMPLETE - 3+ VIEW; RIGHT ANKLE - COMPLETE 3+ VIEW COMPARISON:  None Available. FINDINGS: Right ankle: The ankle mortise is symmetric and intact. Mild distal medial malleolar degenerative spurring. Minimal chronic  enthesopathic change at the Achilles tendon insertion on the calcaneus, seen as a 4 mm chronic ossicle just proximal to the distal tendon insertion. No acute fracture or dislocation. Right foot: Mild hallux valgus. Minimal degenerative spurring at the peripheral aspect of the great toe metatarsophalangeal joint without joint space narrowing. Mild second tarsometatarsal joint space narrowing and subchondral sclerosis. There is chronic foreshortening of the distal aspect of the proximal phalanx of the fifth toe, possibly the sequela of remote trauma or prior surgery. No acute fracture or dislocation. IMPRESSION: 1. No acute fracture. 2. Mild hallux valgus. 3. Mild second tarsometatarsal osteoarthritis. Electronically Signed   By: Neita Garnet M.D.   On: 04/02/2023 11:27   DG Foot Complete Right Result Date: 04/02/2023 CLINICAL DATA:  Lateral ankle and foot pain since injury 6 days ago. Swelling. EXAM: RIGHT FOOT COMPLETE - 3+ VIEW; RIGHT ANKLE - COMPLETE 3+ VIEW COMPARISON:  None Available. FINDINGS: Right ankle: The ankle mortise is symmetric and intact. Mild distal medial malleolar degenerative spurring. Minimal chronic enthesopathic change at the Achilles tendon insertion on the calcaneus, seen as a 4 mm chronic ossicle just proximal to the distal tendon insertion. No acute fracture or dislocation. Right foot: Mild hallux valgus. Minimal degenerative spurring at the peripheral aspect of the great toe metatarsophalangeal joint without joint space narrowing. Mild second tarsometatarsal joint space narrowing and subchondral sclerosis. There is chronic foreshortening of the distal aspect of the proximal phalanx of the fifth toe, possibly the sequela of remote trauma or prior surgery. No acute fracture or dislocation. IMPRESSION: 1. No acute fracture. 2. Mild hallux valgus. 3. Mild second tarsometatarsal osteoarthritis. Electronically Signed   By: Neita Garnet M.D.   On: 04/02/2023 11:27     Assessment and Plan:      ICD-10-CM   1. Acute right ankle pain  M25.571 MR ANKLE RIGHT WO CONTRAST    2. Chronic foot pain, unspecified laterality  M79.673 MR ANKLE RIGHT WO CONTRAST   G89.29     3. Tibialis tendinitis of both lower extremities  M76.821    M76.822     4. Achilles tendinitis, right leg  M76.61     5. Peroneal tendinitis of both lower legs  M76.71    M76.72     6. Sprain of anterior talofibular ligament of right ankle, initial encounter  S93.491A     7. Sprain of calcaneofibular ligament of right ankle, initial encounter  S93.411A     8. Pain of right heel  M79.671 MR ANKLE RIGHT WO CONTRAST     Initially, I suspect that she had a severe ATFL and CFL ligament sprain on the right side.  Right now, she has a complicated multipart pain syndrome involving multiple parts of the anatomy of the foot and ankle on the right and she also has some symptoms on the left.  On the right side, she has pain at the medial malleolus, lateral malleolus, talus, Achilles tendon  insertion, peroneal tendon, posterior tibialis tendon, ATFL, CFL, and the calcaneus.  On the left side, she has tenderness at the peroneal and posterior tibialis tendons.  This is a challenging case and that she has pain relatively diffusely throughout the foot and ankle.  We talked about various treatment options, and for now I am going to put her in a pneumatic cam walker boot for the next 4 weeks.  If she is not better at follow-up, obtain an MRI of the ankle.  Addendum: 04/22/23 1:27 PM  The patient has sent a number of communications to our office alerting [place] that she is doing poorly.  Significant difficulties bearing weight and walking despite being immobilized in a CAM walker boot.  I have advised her to d/c the CAM boot but use a walker to take weight off of the effected limb.  Obtain an MRI of the R ankle to evaluate for insufficiency or stress fracture of the ankle, midfoot, or calcaneous.  Medication Management during  today's office visit: No orders of the defined types were placed in this encounter.  There are no discontinued medications.  Orders placed today for conditions managed today: Orders Placed This Encounter  Procedures   MR ANKLE RIGHT WO CONTRAST    Disposition: Return in about 4 weeks (around 05/07/2023) for Dr. Patsy Lager, R ankle follow-up.  [employer] One speech-to-text software was used for transcription in this dictation.  Possible transcriptional errors can occur using Animal nutritionist.   Signed,  Elpidio Galea. Camdan Burdi, MD   Outpatient Encounter Medications as of 04/09/2023  Medication Sig   albuterol (VENTOLIN HFA) 108 (90 Base) MCG/ACT inhaler Inhale 2 puffs into the lungs every 6 (six) hours as needed for wheezing or shortness of breath.   amitriptyline (ELAVIL) 25 MG tablet TAKE 1 TABLET AT BEDTIME   amLODipine (NORVASC) 5 MG tablet Take 1 tablet (5 mg total) by mouth daily.   aspirin 81 MG EC tablet Take 81 mg by mouth at bedtime.    celecoxib (CELEBREX) 200 MG capsule Take 1 capsule (200 mg total) by mouth daily.   Cholecalciferol (VITAMIN D3) 50 MCG (2000 UT) TABS Take 2,000 Units by mouth daily.   Cyanocobalamin (VITAMIN B-12) 1000 MCG SUBL Take 2,000 mcg by mouth daily.   EPINEPHrine 0.3 mg/0.3 mL IJ SOAJ injection Inject 0.3 mg into the muscle as needed for anaphylaxis.   fluticasone (FLONASE) 50 MCG/ACT nasal spray Place 2 sprays into both nostrils daily. qhs   hydrochlorothiazide (HYDRODIURIL) 25 MG tablet TAKE 1 TABLET DAILY   Melatonin 10 MG CAPS Take 1 capsule by mouth as needed.   methocarbamol (ROBAXIN) 500 MG tablet Take 1 tablet (500 mg total) by mouth every 6 (six) hours as needed for muscle spasms.   mupirocin ointment (BACTROBAN) 2 % Apply topically 2 (two) times daily.   omeprazole (PRILOSEC) 40 MG capsule Take 1 capsule (40 mg total) by mouth daily.   ondansetron (ZOFRAN-ODT) 4 MG disintegrating tablet Take 1 tablet (4 mg total) by mouth every 8 (eight)  hours as needed for nausea or vomiting.   polyethylene glycol (MIRALAX / GLYCOLAX) 17 g packet Take 17 g by mouth daily.   pregabalin (LYRICA) 150 MG capsule Take one pill by mouth in the am and 2 in the pm   rOPINIRole (REQUIP) 1 MG tablet Take 1 mg by mouth in the morning and at bedtime. 1 in am and 1 at 3pm   rOPINIRole (REQUIP) 4 MG tablet TAKE 1 TABLET AT BEDTIME  simvastatin (ZOCOR) 20 MG tablet TAKE 1 TABLET AT BEDTIME   tiZANidine (ZANAFLEX) 4 MG tablet Take 4 mg by mouth 3 (three) times daily.   traMADol (ULTRAM) 50 MG tablet Take 1 tablet (50 mg total) by mouth every 12 (twelve) hours as needed for severe pain.   No facility-administered encounter medications on file as of 04/09/2023.

## 2023-04-09 ENCOUNTER — Ambulatory Visit: Payer: Medicare Other | Admitting: Oncology

## 2023-04-09 ENCOUNTER — Other Ambulatory Visit: Payer: Self-pay | Admitting: *Deleted

## 2023-04-09 ENCOUNTER — Ambulatory Visit (INDEPENDENT_AMBULATORY_CARE_PROVIDER_SITE_OTHER): Payer: Medicare Other | Admitting: Family Medicine

## 2023-04-09 ENCOUNTER — Other Ambulatory Visit: Payer: Medicare Other

## 2023-04-09 ENCOUNTER — Encounter: Payer: Self-pay | Admitting: Family Medicine

## 2023-04-09 VITALS — BP 140/68 | HR 74 | Temp 98.5°F | Ht <= 58 in | Wt 176.5 lb

## 2023-04-09 DIAGNOSIS — M79671 Pain in right foot: Secondary | ICD-10-CM

## 2023-04-09 DIAGNOSIS — M76821 Posterior tibial tendinitis, right leg: Secondary | ICD-10-CM | POA: Diagnosis not present

## 2023-04-09 DIAGNOSIS — M25571 Pain in right ankle and joints of right foot: Secondary | ICD-10-CM | POA: Diagnosis not present

## 2023-04-09 DIAGNOSIS — M76822 Posterior tibial tendinitis, left leg: Secondary | ICD-10-CM

## 2023-04-09 DIAGNOSIS — M7661 Achilles tendinitis, right leg: Secondary | ICD-10-CM

## 2023-04-09 DIAGNOSIS — G8929 Other chronic pain: Secondary | ICD-10-CM

## 2023-04-09 DIAGNOSIS — D509 Iron deficiency anemia, unspecified: Secondary | ICD-10-CM

## 2023-04-09 DIAGNOSIS — M7672 Peroneal tendinitis, left leg: Secondary | ICD-10-CM

## 2023-04-09 DIAGNOSIS — M79673 Pain in unspecified foot: Secondary | ICD-10-CM | POA: Diagnosis not present

## 2023-04-09 DIAGNOSIS — M7671 Peroneal tendinitis, right leg: Secondary | ICD-10-CM

## 2023-04-09 DIAGNOSIS — S93411A Sprain of calcaneofibular ligament of right ankle, initial encounter: Secondary | ICD-10-CM

## 2023-04-09 DIAGNOSIS — S93491A Sprain of other ligament of right ankle, initial encounter: Secondary | ICD-10-CM

## 2023-04-09 NOTE — Patient Instructions (Signed)
 Take Tylenol /Acetaminophen  ES (500mg ) 2 tabs by mouth three times a day max as needed.   Voltaren 1% gel, over the counter You can apply up to 4 times a day  This can be applied to any joint: knee, wrist, fingers, elbows, shoulders, feet and ankles. Can apply to any tendon: tennis elbow, achilles, tendon, rotator cuff or any other tendon.  Minimal is absorbed in the bloodstream: ok with oral anti-inflammatory or a blood thinner.  Cost is about 9 dollars

## 2023-04-10 ENCOUNTER — Inpatient Hospital Stay: Payer: Medicare Other | Admitting: Oncology

## 2023-04-10 ENCOUNTER — Encounter: Payer: Self-pay | Admitting: Oncology

## 2023-04-10 ENCOUNTER — Inpatient Hospital Stay: Payer: Medicare Other | Attending: Oncology

## 2023-04-10 VITALS — BP 133/63 | HR 78 | Temp 97.6°F | Resp 19 | Wt 178.2 lb

## 2023-04-10 DIAGNOSIS — D509 Iron deficiency anemia, unspecified: Secondary | ICD-10-CM | POA: Diagnosis not present

## 2023-04-10 LAB — IRON AND TIBC
Iron: 70 ug/dL (ref 28–170)
Saturation Ratios: 19 % (ref 10.4–31.8)
TIBC: 361 ug/dL (ref 250–450)
UIBC: 291 ug/dL

## 2023-04-10 LAB — CBC WITH DIFFERENTIAL/PLATELET
Abs Immature Granulocytes: 0.03 10*3/uL (ref 0.00–0.07)
Basophils Absolute: 0 10*3/uL (ref 0.0–0.1)
Basophils Relative: 1 %
Eosinophils Absolute: 0.3 10*3/uL (ref 0.0–0.5)
Eosinophils Relative: 4 %
HCT: 43.7 % (ref 36.0–46.0)
Hemoglobin: 14.1 g/dL (ref 12.0–15.0)
Immature Granulocytes: 0 %
Lymphocytes Relative: 27 %
Lymphs Abs: 2 10*3/uL (ref 0.7–4.0)
MCH: 27.4 pg (ref 26.0–34.0)
MCHC: 32.3 g/dL (ref 30.0–36.0)
MCV: 85 fL (ref 80.0–100.0)
Monocytes Absolute: 0.7 10*3/uL (ref 0.1–1.0)
Monocytes Relative: 9 %
Neutro Abs: 4.5 10*3/uL (ref 1.7–7.7)
Neutrophils Relative %: 59 %
Platelets: 265 10*3/uL (ref 150–400)
RBC: 5.14 MIL/uL — ABNORMAL HIGH (ref 3.87–5.11)
RDW: 13.9 % (ref 11.5–15.5)
WBC: 7.5 10*3/uL (ref 4.0–10.5)
nRBC: 0 % (ref 0.0–0.2)

## 2023-04-10 LAB — VITAMIN B12: Vitamin B-12: 242 pg/mL (ref 180–914)

## 2023-04-10 LAB — FERRITIN: Ferritin: 95 ng/mL (ref 11–307)

## 2023-04-10 LAB — FOLATE: Folate: 12.1 ng/mL (ref >5.9)

## 2023-04-10 NOTE — Progress Notes (Signed)
Hematology/Oncology Consult note [facility]  Telephone:([phone] [phone]  Patient Care Team: Tower, Audrie Gallus, MD as PCP - General (Family Medicine) Kathyrn Sheriff, [facility] (Inactive) as Pharmacist (Pharmacist) Creig Hines, MD as Consulting Physician (Oncology)   Name of the patient: Kathlean Cinco  [id number]  1940/12/09   Date of visit: 04/10/23  Diagnosis-iron deficiency anemia  Chief complaint/ Reason for visit-routine follow-up of iron deficiency anemia  Heme/Onc history: Patient is a 83 year old female with a past medical history significant for coronary artery disease, hypertension hyperlipidemia, COPD obstructive sleep apnea among other medical problems.  She has been referred for iron deficiency anemia.  Most recent CBC from 06/08/2022 showed white cell count of 8.2, H&H of 9.6/30.7 with an MCV of 73 and a platelet count of 415.  Iron studies showed ferritin of 16 with an iron saturation of 5% and low serum iron as well.  B12 and folate levels were normal.  Her baseline hemoglobin runs around 12 which was the case up on August 2022 and subsequently it started drifting down further.  It went down to 6.4 in February 2024.  She was seen by Dr. Allegra Lai for her iron deficiency upper endoscopy from 06/15/2022 showed normal duodenal bulb.  Erythematous mucosa noted in the antrum which was biopsied and is currently pending.  Normal-appearing gastric fundus body as well as GE junction and esophagus.  Her last colonoscopy was in 2020 which showed polyps in the descending and ascending colon and repeat colonoscopy was recommended in 5 years.  She has been referred for consideration of IV iron.  She has a prior history of duodenal ulcer.Patient had EGD with Dr. Allegra Lai in April 2024 which showed erythematous mucosa in the antrum which was biopsied and was negative for malignancy.  No active bleeding noted     Interval history-patient has sustained right  ankle sprin and is currently in a boot.  Otherwise she is doing well overall and denies any complaints at this time  ECOG PS- 1 Pain scale- 0   Review of systems- Review of Systems  Constitutional:  Negative for chills, fever, malaise/fatigue and weight loss.  HENT:  Negative for congestion, ear discharge and nosebleeds.   Eyes:  Negative for blurred vision.  Respiratory:  Negative for cough, hemoptysis, sputum production, shortness of breath and wheezing.   Cardiovascular:  Negative for chest pain, palpitations, orthopnea and claudication.  Gastrointestinal:  Negative for abdominal pain, blood in stool, constipation, diarrhea, heartburn, melena, nausea and vomiting.  Genitourinary:  Negative for dysuria, flank pain, frequency, hematuria and urgency.  Musculoskeletal:  Negative for back pain, joint pain and myalgias.  Skin:  Negative for rash.  Neurological:  Negative for dizziness, tingling, focal weakness, seizures, weakness and headaches.  Endo/Heme/Allergies:  Does not bruise/bleed easily.  Psychiatric/Behavioral:  Negative for depression and suicidal ideas. The patient does not have insomnia.       Allergies  Allergen Reactions   Bee Venom Anaphylaxis and Hives   Fosamax [Alendronate] Nausea Only    Stomach pain   Fusion Plus [Iron-Fa-B Cmp-C-Biot-Probiotic] Nausea Only    Stomach pain   Ciprofloxacin Hcl Rash    Mild rash after long term use   Sulfa Antibiotics Rash    Bactrim     Past Medical History:  Diagnosis Date   Anemia    Arthritis    Asthma    Bacteremia due to Gram-negative bacteria 06/04/2019   CAD (coronary artery disease) 07/21/2016  a.) LHC 07/21/2016: mild nonobstructive CAD   Colon polyps    Complication of anesthesia    a.) anesthesia awareness   Compression fracture of L1 lumbar vertebra (HCC)    COPD (chronic obstructive pulmonary disease) (HCC)    Diastolic dysfunction    a.) TTE 03/02/2021: EF >55%, mild LVH, mild LAE, triv MR, mild TR/PR,  G1DD   Duodenal ulcer    Dyspnea    GERD (gastroesophageal reflux disease)    History of 2019 novel coronavirus disease (COVID-19) 01/19/2021   a.) tested (+) at [facility]   History of lumbar fusion    a.) T12-S1   Hyperlipidemia    Hypertension    Insomnia    a.) takes melatonin PRN   OSA on CPAP    Osteopenia    Peripheral neuropathy    Plantar fasciitis    Pneumonia    Pulmonary hypertension (HCC)    a.) TTE 06/04/2019: moderate (PASP 40.3 mmHg)   Right inguinal hernia    a.) s/p repair 12/2016   RLS (restless legs syndrome)    a.) on ropinirole   SOB (shortness of breath)    T12 compression fracture (HCC)    Umbilical hernia    a.) s/p repair 12/2016   Urinary incontinence    Urinary tract infection    Wears hearing aid in both ears      Past Surgical History:  Procedure Laterality Date   ABDOMINAL HYSTERECTOMY  1972   APPENDECTOMY  1958   APPLICATION OF INTRAOPERATIVE CT SCAN N/A 04/17/2022   Procedure: APPLICATION OF INTRAOPERATIVE CT SCAN;  Surgeon: Venetia Night, MD;  Location: [facility];  Service: Neurosurgery;  Laterality: N/A;   BILATERAL SALPINGOOPHORECTOMY  01/07/2018   BIOPSY  04/15/2019   Procedure: BIOPSY;  Surgeon: Toney Reil, MD;  Location: [facility];  Service: Endoscopy;;   CARDIAC CATHETERIZATION Left 07/21/2016   COLONOSCOPY WITH PROPOFOL N/A 11/11/2018   Procedure: COLONOSCOPY WITH PROPOFOL;  Surgeon: Toney Reil, MD;  Location: [facility];  Service: Gastroenterology;  Laterality: N/A;   CYSTO WITH HYDRODISTENSION N/A 12/30/2018   Procedure: CYSTOSCOPY/HYDRODISTENSION WITH MARCAINE;  Surgeon: Alfredo Martinez, MD;  Location: [facility];  Service: Urology;  Laterality: N/A;   ESOPHAGOGASTRODUODENOSCOPY (EGD) WITH PROPOFOL N/A 04/15/2019   Procedure: ESOPHAGOGASTRODUODENOSCOPY (EGD) WITH PROPOFOL;  Surgeon: Toney Reil, MD;  Location: [facility];  Service: Endoscopy;  Laterality: N/A;  sleep apnea    ESOPHAGOGASTRODUODENOSCOPY (EGD) WITH PROPOFOL N/A 07/09/2019   Procedure: ESOPHAGOGASTRODUODENOSCOPY (EGD) WITH PROPOFOL;  Surgeon: Toney Reil, MD;  Location: [facility] ENDOSCOPY;  Service: Gastroenterology;  Laterality: N/A;   ESOPHAGOGASTRODUODENOSCOPY (EGD) WITH PROPOFOL N/A 06/15/2022   Procedure: ESOPHAGOGASTRODUODENOSCOPY (EGD) WITH PROPOFOL;  Surgeon: Toney Reil, MD;  Location: [facility];  Service: Endoscopy;  Laterality: N/A;  sleep apnea   EYE SURGERY Bilateral    INGUINAL HERNIA REPAIR Bilateral 01/05/2017   JOINT REPLACEMENT     LAPAROSCOPIC BILATERAL SALPINGO OOPHERECTOMY  2019   LUMBAR FUSION  2002   LUMBAR FUSION  11/14/2021   Dr Sharolyn Douglas   LUMBAR FUSION  12/14/2021   Dr Malachy Chamber   SPINAL FUSION  2002   TONSILLECTOMY AND ADENOIDECTOMY  1961   TOTAL KNEE ARTHROPLASTY Right 01/01/2020   Procedure: Right total knee arthroplasty - [place] Neighbor to Assist;  Surgeon: Kennedy Bucker, MD;  Location: [facility];  Service: Orthopedics;  Laterality: Right;   TOTAL KNEE ARTHROPLASTY Left 10/12/2020   Procedure: TOTAL KNEE ARTHROPLASTY;  Surgeon: Kennedy Bucker,  MD;  Location: [facility];  Service: Orthopedics;  Laterality: Left;   TUBAL LIGATION     UMBILICAL HERNIA REPAIR N/A 01/05/2017    Social History   Socioeconomic History   Marital status: Widowed    Spouse name: Not on file   Number of children: 3   Years of education: Not on file   Highest education level: Associate degree: academic program  Occupational History   Not on file  Tobacco Use   Smoking status: Never   Smokeless tobacco: Never  Vaping Use   Vaping status: Never Used  Substance and Sexual Activity   Alcohol use: Not Currently   Drug use: Never   Sexual activity: Not Currently  Other Topics Concern   Not on file  Social History Narrative   Moved from [place] 2020, lives near daughter. Retired. Enjoys walking. Lives alone   Social Drivers of Health   Financial Resource Strain:  Medium Risk (04/02/2023)   Overall Financial Resource Strain (CARDIA)    Difficulty of Paying Living Expenses: Somewhat hard  Food Insecurity: No Food Insecurity (04/02/2023)   Hunger Vital Sign    Worried About Running Out of Food in the Last Year: Never true    Ran Out of Food in the Last Year: Never true  Transportation Needs: No Transportation Needs (04/02/2023)   PRAPARE - Administrator, Civil Service (Medical): No    Lack of Transportation (Non-Medical): No  Physical Activity: Inactive (04/02/2023)   Exercise Vital Sign    Days of Exercise per Week: 0 days    Minutes of Exercise per Session: 10 min  Stress: No Stress Concern Present (04/02/2023)   [employer] of Occupational Health - Occupational Stress Questionnaire    Feeling of Stress : Only a little  Social Connections: Moderately Integrated (04/02/2023)   Social Connection and Isolation Panel [NHANES]    Frequency of Communication with Friends and Family: More than three times a week    Frequency of Social Gatherings with Friends and Family: More than three times a week    Attends Religious Services: More than 4 times per year    Active Member of [employer] or Organizations: Yes    Attends Banker Meetings: More than 4 times per year    Marital Status: Widowed  Intimate Partner Violence: Not At Risk (10/02/2022)   Humiliation, Afraid, Rape, and Kick questionnaire    Fear of Current or Ex-Partner: No    Emotionally Abused: No    Physically Abused: No    Sexually Abused: No    Family History  Problem Relation Age of Onset   Alcohol abuse Mother    Cancer Mother    Stroke Mother    Alcohol abuse Father    Arthritis Sister    Arthritis Daughter    COPD Daughter    Arthritis Paternal Grandmother    Asthma Paternal Grandmother    COPD Paternal Grandmother    Breast cancer Paternal Aunt      Current Outpatient Medications:    albuterol (VENTOLIN HFA) 108 (90 Base) MCG/ACT inhaler, Inhale 2 puffs into  the lungs every 6 (six) hours as needed for wheezing or shortness of breath., Disp: 8 g, Rfl: 5   amitriptyline (ELAVIL) 25 MG tablet, TAKE 1 TABLET AT BEDTIME, Disp: 90 tablet, Rfl: 1   amLODipine (NORVASC) 5 MG tablet, Take 1 tablet (5 mg total) by mouth daily., Disp: 90 tablet, Rfl: 2   aspirin 81 MG EC tablet, Take 81  mg by mouth at bedtime. , Disp: , Rfl:    celecoxib (CELEBREX) 200 MG capsule, Take 1 capsule (200 mg total) by mouth daily., Disp: 90 capsule, Rfl: 3   Cholecalciferol (VITAMIN D3) 50 MCG (2000 UT) TABS, Take 2,000 Units by mouth daily., Disp: , Rfl:    Cyanocobalamin (VITAMIN B-12) 1000 MCG SUBL, Take 2,000 mcg by mouth daily., Disp: , Rfl:    EPINEPHrine 0.3 mg/0.3 mL IJ SOAJ injection, Inject 0.3 mg into the muscle as needed for anaphylaxis., Disp: 2 each, Rfl: 0   fluticasone (FLONASE) 50 MCG/ACT nasal spray, Place 2 sprays into both nostrils daily. qhs, Disp: 48 g, Rfl: 0   hydrochlorothiazide (HYDRODIURIL) 25 MG tablet, TAKE 1 TABLET DAILY, Disp: 90 tablet, Rfl: 3   Melatonin 10 MG CAPS, Take 1 capsule by mouth as needed., Disp: , Rfl:    methocarbamol (ROBAXIN) 500 MG tablet, Take 1 tablet (500 mg total) by mouth every 6 (six) hours as needed for muscle spasms., Disp: 180 tablet, Rfl: 0   mupirocin ointment (BACTROBAN) 2 %, Apply topically 2 (two) times daily., Disp: 22 g, Rfl: 0   omeprazole (PRILOSEC) 40 MG capsule, Take 1 capsule (40 mg total) by mouth daily., Disp: 90 capsule, Rfl: 3   ondansetron (ZOFRAN-ODT) 4 MG disintegrating tablet, Take 1 tablet (4 mg total) by mouth every 8 (eight) hours as needed for nausea or vomiting., Disp: 270 tablet, Rfl: 0   polyethylene glycol (MIRALAX / GLYCOLAX) 17 g packet, Take 17 g by mouth daily., Disp: , Rfl:    pregabalin (LYRICA) 150 MG capsule, Take one pill by mouth in the am and 2 in the pm, Disp: 270 capsule, Rfl: 0   rOPINIRole (REQUIP) 1 MG tablet, Take 1 mg by mouth in the morning and at bedtime. 1 in am and 1 at 3pm,  Disp: , Rfl:    rOPINIRole (REQUIP) 4 MG tablet, TAKE 1 TABLET AT BEDTIME, Disp: 90 tablet, Rfl: 3   simvastatin (ZOCOR) 20 MG tablet, TAKE 1 TABLET AT BEDTIME, Disp: 90 tablet, Rfl: 3   tiZANidine (ZANAFLEX) 4 MG tablet, Take 4 mg by mouth 3 (three) times daily., Disp: , Rfl:    traMADol (ULTRAM) 50 MG tablet, Take 1 tablet (50 mg total) by mouth every 12 (twelve) hours as needed for severe pain., Disp: 15 tablet, Rfl: 0  Physical exam:  Vitals:   04/10/23 1330  Weight: 178 lb 3.2 oz (80.8 kg)   Physical Exam Cardiovascular:     Rate and Rhythm: Normal rate and regular rhythm.     Heart sounds: Normal heart sounds.  Pulmonary:     Effort: Pulmonary effort is normal.     Breath sounds: Normal breath sounds.  Abdominal:     General: Bowel sounds are normal.     Palpations: Abdomen is soft.  Musculoskeletal:     Comments: Right lower extremity in the boot  Skin:    General: Skin is warm and dry.  Neurological:     Mental Status: She is alert and oriented to person, place, and time.         Latest Ref Rng & Units 10/24/2022   11:29 AM  CMP  Glucose 70 - 99 mg/dL 161   BUN 6 - 23 mg/dL 15   Creatinine 0.96 - 1.20 mg/dL 0.45   Sodium 409 - 811 mEq/L 137   Potassium 3.5 - 5.1 mEq/L 3.5   Chloride 96 - 112 mEq/L 96   CO2 19 -  32 mEq/L 32   Calcium 8.4 - 10.5 mg/dL 9.4   Total Protein 6.0 - 8.3 g/dL 7.1   Total Bilirubin 0.2 - 1.2 mg/dL 0.3   Alkaline Phos 39 - 117 U/L 100   AST 0 - 37 U/L 22   ALT 0 - 35 U/L 28       Latest Ref Rng & Units 04/10/2023    1:19 PM  CBC  WBC 4.0 - 10.5 K/uL 7.5   Hemoglobin 12.0 - 15.0 g/dL 40.9   Hematocrit 81.1 - 46.0 % 43.7   Platelets 150 - 400 K/uL 265     No images are attached to the encounter.  DG Ankle Complete Right Result Date: 04/02/2023 CLINICAL DATA:  Lateral ankle and foot pain since injury 6 days ago. Swelling. EXAM: RIGHT FOOT COMPLETE - 3+ VIEW; RIGHT ANKLE - COMPLETE 3+ VIEW COMPARISON:  None Available. FINDINGS:  Right ankle: The ankle mortise is symmetric and intact. Mild distal medial malleolar degenerative spurring. Minimal chronic enthesopathic change at the Achilles tendon insertion on the calcaneus, seen as a 4 mm chronic ossicle just proximal to the distal tendon insertion. No acute fracture or dislocation. Right foot: Mild hallux valgus. Minimal degenerative spurring at the peripheral aspect of the great toe metatarsophalangeal joint without joint space narrowing. Mild second tarsometatarsal joint space narrowing and subchondral sclerosis. There is chronic foreshortening of the distal aspect of the proximal phalanx of the fifth toe, possibly the sequela of remote trauma or prior surgery. No acute fracture or dislocation. IMPRESSION: 1. No acute fracture. 2. Mild hallux valgus. 3. Mild second tarsometatarsal osteoarthritis. Electronically Signed   By: Neita Garnet M.D.   On: 04/02/2023 11:27   DG Foot Complete Right Result Date: 04/02/2023 CLINICAL DATA:  Lateral ankle and foot pain since injury 6 days ago. Swelling. EXAM: RIGHT FOOT COMPLETE - 3+ VIEW; RIGHT ANKLE - COMPLETE 3+ VIEW COMPARISON:  None Available. FINDINGS: Right ankle: The ankle mortise is symmetric and intact. Mild distal medial malleolar degenerative spurring. Minimal chronic enthesopathic change at the Achilles tendon insertion on the calcaneus, seen as a 4 mm chronic ossicle just proximal to the distal tendon insertion. No acute fracture or dislocation. Right foot: Mild hallux valgus. Minimal degenerative spurring at the peripheral aspect of the great toe metatarsophalangeal joint without joint space narrowing. Mild second tarsometatarsal joint space narrowing and subchondral sclerosis. There is chronic foreshortening of the distal aspect of the proximal phalanx of the fifth toe, possibly the sequela of remote trauma or prior surgery. No acute fracture or dislocation. IMPRESSION: 1. No acute fracture. 2. Mild hallux valgus. 3. Mild second  tarsometatarsal osteoarthritis. Electronically Signed   By: Neita Garnet M.D.   On: 04/02/2023 11:27   DG Lumbar Spine 2-3 Views Result Date: 03/26/2023 CLINICAL DATA:  Postop thoracolumbar fusion. EXAM: LUMBAR SPINE - 2-3 VIEW COMPARISON:  10/12/2022. FINDINGS: Previous posterior fusion has been performed from T10 through the sacrum. There are pedicle screws bilaterally at T10, T11 L1, L2, L3, L4 and S1 with a left-sided pedicle screw at L5. There are also screws extending across both SI joints. Interconnecting rods extend from T10 through S1 with additional interconnecting rod on from L4 through S1, and another on the right from L4 to the SI joint fusion screw. Lateral fixation plate noted at L3 and L4 on the left. Intervertebral cage is are present at L3-L4 and L4-L5. All hardware appears intact, well positioned and stable without evidence of loosening. Chronic fractures of T12  and L1. No other lumbar spine fractures. No bone lesions. Stable alignment. Skeletal structures are demineralized. Aortic atherosclerotic calcifications. IMPRESSION: 1. No acute findings. 2. Chronic compression fractures of T12 and L1. 3. Stable appearance of the long thoracolumbar fusion as detailed. No evidence of hardware failure. Electronically Signed   By: Amie Portland M.D.   On: 03/26/2023 11:55   DG Thoracic Spine 2 View Result Date: 03/26/2023 CLINICAL DATA:  Follow-up thoracic spine fusion. Lower midline back pain. EXAM: THORACIC SPINE 2 VIEWS COMPARISON:  10/12/2022. FINDINGS: Status post posterior fusion from T10 through the lumbar spine and below the included field of view. There are bilateral pedicle screws with associated fixation cement at T10 and T11. Intact interconnecting rods extend from T10 to the lower lumbar spine. Hardware appears well positioned and seated without evidence of loosening. Mild wedge-shaped deformity of T9 is unchanged and may reflect an old fracture or be degenerative. Fracture of T12 is  stable. No new fractures. No bone lesions. No spondylolisthesis. Mild curvature of the lower thoracic spine, convex to the right. Skeletal structures are demineralized. Soft tissues are unremarkable. IMPRESSION: 1. No acute fracture or acute finding. 2. Well positioned and seated orthopedic hardware. No evidence of loosening. 3. Chronic T12 fracture. Electronically Signed   By: Amie Portland M.D.   On: 03/26/2023 11:50     Assessment and plan- Patient is a 83 y.o. female here for routine follow-up of iron deficiency anemia  Patient is not presently anemic with an H&H of 14.1/43.7.  Ferritin and iron studies B12 and folate levels are pending.  She last received IV iron in August 2024.  Repeat CBC ferritin and iron studies in 4 and 8 months and I will see her back in 8 months   Visit Diagnosis 1. Iron deficiency anemia, unspecified iron deficiency anemia type      Dr. Owens Shark, MD, MPH [facility] at [facility] [phone] 04/10/2023 1:31 PM

## 2023-04-17 DIAGNOSIS — G4733 Obstructive sleep apnea (adult) (pediatric): Secondary | ICD-10-CM | POA: Diagnosis not present

## 2023-04-19 ENCOUNTER — Encounter: Payer: Self-pay | Admitting: Family Medicine

## 2023-04-20 ENCOUNTER — Ambulatory Visit: Payer: Medicare Other | Admitting: Podiatry

## 2023-04-20 ENCOUNTER — Encounter: Payer: Self-pay | Admitting: Podiatry

## 2023-04-20 DIAGNOSIS — L600 Ingrowing nail: Secondary | ICD-10-CM

## 2023-04-20 NOTE — Progress Notes (Signed)
Chief Complaint  Patient presents with   Nail Problem    "It's the toenails on both big toes." N - toenails L - hallux bilateral D - 3 years O - gradually worse C - they dig in, throb  A - shoes that press on big toe T - buy bigger shoes, try to cut the toenail when it is really digging in.    Subjective: Patient presents today for evaluation of pain to the medial and lateral border of the right great toe. Patient is concerned for possible ingrown nail.  It is very sensitive to touch.  Patient presents today for further treatment and evaluation.  Past Medical History:  Diagnosis Date   Anemia    Arthritis    Asthma    Bacteremia due to Gram-negative bacteria 06/04/2019   CAD (coronary artery disease) 07/21/2016   a.) LHC 07/21/2016: mild nonobstructive CAD   Colon polyps    Complication of anesthesia    a.) anesthesia awareness   Compression fracture of L1 lumbar vertebra (HCC)    COPD (chronic obstructive pulmonary disease) (HCC)    Diastolic dysfunction    a.) TTE 03/02/2021: EF >55%, mild LVH, mild LAE, triv MR, mild TR/PR, G1DD   Duodenal ulcer    Dyspnea    GERD (gastroesophageal reflux disease)    History of 2019 novel coronavirus disease (COVID-19) 01/19/2021   a.) tested (+) at [facility]   History of lumbar fusion    a.) T12-S1   Hyperlipidemia    Hypertension    Insomnia    a.) takes melatonin PRN   OSA on CPAP    Osteopenia    Peripheral neuropathy    Plantar fasciitis    Pneumonia    Pulmonary hypertension (HCC)    a.) TTE 06/04/2019: moderate (PASP 40.3 mmHg)   Right inguinal hernia    a.) s/p repair 12/2016   RLS (restless legs syndrome)    a.) on ropinirole   SOB (shortness of breath)    T12 compression fracture (HCC)    Umbilical hernia    a.) s/p repair 12/2016   Urinary incontinence    Urinary tract infection    Wears hearing aid in both ears     Past Surgical History:  Procedure Laterality Date   ABDOMINAL HYSTERECTOMY  1972    APPENDECTOMY  1958   APPLICATION OF INTRAOPERATIVE CT SCAN N/A 04/17/2022   Procedure: APPLICATION OF INTRAOPERATIVE CT SCAN;  Surgeon: Venetia Night, MD;  Location: [facility];  Service: Neurosurgery;  Laterality: N/A;   BILATERAL SALPINGOOPHORECTOMY  01/07/2018   BIOPSY  04/15/2019   Procedure: BIOPSY;  Surgeon: Toney Reil, MD;  Location: [facility];  Service: Endoscopy;;   CARDIAC CATHETERIZATION Left 07/21/2016   COLONOSCOPY WITH PROPOFOL N/A 11/11/2018   Procedure: COLONOSCOPY WITH PROPOFOL;  Surgeon: Toney Reil, MD;  Location: [facility];  Service: Gastroenterology;  Laterality: N/A;   CYSTO WITH HYDRODISTENSION N/A 12/30/2018   Procedure: CYSTOSCOPY/HYDRODISTENSION WITH MARCAINE;  Surgeon: Alfredo Martinez, MD;  Location: [facility];  Service: Urology;  Laterality: N/A;   ESOPHAGOGASTRODUODENOSCOPY (EGD) WITH PROPOFOL N/A 04/15/2019   Procedure: ESOPHAGOGASTRODUODENOSCOPY (EGD) WITH PROPOFOL;  Surgeon: Toney Reil, MD;  Location: [facility];  Service: Endoscopy;  Laterality: N/A;  sleep apnea   ESOPHAGOGASTRODUODENOSCOPY (EGD) WITH PROPOFOL N/A 07/09/2019   Procedure: ESOPHAGOGASTRODUODENOSCOPY (EGD) WITH PROPOFOL;  Surgeon: Toney Reil, MD;  Location: [facility] END[facility]COPY;  Service: Gastroenterology;  Laterality: N/A;   ESOPHAGOGASTRODUODENOSCOPY (EGD) WITH PROPOFOL N/A 06/15/2022  Procedure: ESOPHAGOGASTRODUODENOSCOPY (EGD) WITH PROPOFOL;  Surgeon: Toney Reil, MD;  Location: [facility];  Service: Endoscopy;  Laterality: N/A;  sleep apnea   EYE SURGERY Bilateral    INGUINAL HERNIA REPAIR Bilateral 01/05/2017   JOINT REPLACEMENT     LAPAROSCOPIC BILATERAL SALPINGO OOPHERECTOMY  2019   LUMBAR FUSION  2002   LUMBAR FUSION  11/14/2021   Dr Sharolyn Douglas   LUMBAR FUSION  12/14/2021   Dr Malachy Chamber   SPINAL FUSION  2002   TONSILLECTOMY AND ADENOIDECTOMY  1961   TOTAL KNEE ARTHROPLASTY Right 01/01/2020   Procedure:  Right total knee arthroplasty - [place] Neighbor to Assist;  Surgeon: Kennedy Bucker, MD;  Location: [facility];  Service: Orthopedics;  Laterality: Right;   TOTAL KNEE ARTHROPLASTY Left 10/12/2020   Procedure: TOTAL KNEE ARTHROPLASTY;  Surgeon: Kennedy Bucker, MD;  Location: [facility];  Service: Orthopedics;  Laterality: Left;   TUBAL LIGATION     UMBILICAL HERNIA REPAIR N/A 01/05/2017    Allergies  Allergen Reactions   Bee Venom Anaphylaxis and Hives   Fosamax [Alendronate] Nausea Only    Stomach pain   Fusion Plus [Iron-Fa-B Cmp-C-Biot-Probiotic] Nausea Only    Stomach pain   Ciprofloxacin Hcl Rash    Mild rash after long term use   Sulfa Antibiotics Rash    Bactrim    Objective:  General: Well developed, nourished, in no acute distress, alert and oriented x3   Dermatology: Skin is warm, dry and supple bilateral.  Medial and lateral border right great toe is tender with evidence of an ingrowing nail. Pain on palpation noted to the border of the nail fold. The remaining nails appear unremarkable at this time.   Vascular: DP and PT pulses palpable.  No clinical evidence of vascular compromise  Neruologic: Grossly intact via light touch bilateral.  Musculoskeletal: No pedal deformity noted  Assesement: #1 Paronychia with ingrowing nail medial and lateral border right great toe  Plan of Care:  -Patient evaluated.  -Discussed treatment alternatives and plan of care. Explained nail avulsion procedure and post procedure course to patient. -Patient opted for permanent partial nail avulsion of the ingrown portion of the nail.  -Prior to procedure, local anesthesia infiltration utilized using 3 ml of a 50:50 mixture of 2% plain lidocaine and 0.5% plain marcaine in a normal hallux block fashion and a betadine prep performed.  -Partial permanent nail avulsion with chemical matrixectomy performed using 3x30sec applications of phenol followed by alcohol flush.  -Light dressing applied.  Post care  instructions provided -Return to clinic 3 weeks  Felecia Shelling, DPM [facility]  Dr. Felecia Shelling, DPM    [address] [place], [place] [address]                Office [phone]  Fax [phone]

## 2023-04-22 NOTE — Addendum Note (Signed)
 Addended by: Hannah Beat on: 04/22/2023 01:28 PM   Modules accepted: Orders

## 2023-04-23 ENCOUNTER — Encounter: Payer: Self-pay | Admitting: Family Medicine

## 2023-04-27 NOTE — Telephone Encounter (Signed)
 Unable to get FOOT MRI approved given there is already an approval on file for the Ankle MRI.   I tried several times and was unable to get it approved.    The patient will need to contact [facility] and cancel the Foot MRI. If that's what you want her to do.    I have notated on the appt desk that MRI Foot unable to get approved.

## 2023-04-27 NOTE — Addendum Note (Signed)
 Addended by: Hannah Beat on: 04/27/2023 09:15 AM   Modules accepted: Orders

## 2023-04-30 ENCOUNTER — Encounter: Payer: Self-pay | Admitting: Podiatry

## 2023-05-01 ENCOUNTER — Observation Stay
Admission: EM | Admit: 2023-05-01 | Discharge: 2023-05-02 | Disposition: A | Discharge status: Home Health | Attending: Internal Medicine | Admitting: Internal Medicine

## 2023-05-01 ENCOUNTER — Encounter: Payer: Self-pay | Admitting: Family Medicine

## 2023-05-01 ENCOUNTER — Observation Stay

## 2023-05-01 ENCOUNTER — Emergency Department

## 2023-05-01 ENCOUNTER — Other Ambulatory Visit: Payer: Self-pay

## 2023-05-01 DIAGNOSIS — G4733 Obstructive sleep apnea (adult) (pediatric): Secondary | ICD-10-CM | POA: Diagnosis not present

## 2023-05-01 DIAGNOSIS — E785 Hyperlipidemia, unspecified: Secondary | ICD-10-CM | POA: Insufficient documentation

## 2023-05-01 DIAGNOSIS — I672 Cerebral atherosclerosis: Secondary | ICD-10-CM | POA: Diagnosis not present

## 2023-05-01 DIAGNOSIS — K219 Gastro-esophageal reflux disease without esophagitis: Secondary | ICD-10-CM | POA: Diagnosis not present

## 2023-05-01 DIAGNOSIS — I2510 Atherosclerotic heart disease of native coronary artery without angina pectoris: Secondary | ICD-10-CM | POA: Diagnosis not present

## 2023-05-01 DIAGNOSIS — Z9889 Other specified postprocedural states: Secondary | ICD-10-CM | POA: Diagnosis not present

## 2023-05-01 DIAGNOSIS — R4701 Aphasia: Secondary | ICD-10-CM | POA: Diagnosis not present

## 2023-05-01 DIAGNOSIS — I272 Pulmonary hypertension, unspecified: Secondary | ICD-10-CM | POA: Insufficient documentation

## 2023-05-01 DIAGNOSIS — Z96652 Presence of left artificial knee joint: Secondary | ICD-10-CM

## 2023-05-01 DIAGNOSIS — M79674 Pain in right toe(s): Secondary | ICD-10-CM | POA: Insufficient documentation

## 2023-05-01 DIAGNOSIS — I10 Essential (primary) hypertension: Secondary | ICD-10-CM | POA: Insufficient documentation

## 2023-05-01 DIAGNOSIS — J45909 Unspecified asthma, uncomplicated: Secondary | ICD-10-CM | POA: Diagnosis not present

## 2023-05-01 DIAGNOSIS — Z96651 Presence of right artificial knee joint: Secondary | ICD-10-CM

## 2023-05-01 DIAGNOSIS — G629 Polyneuropathy, unspecified: Secondary | ICD-10-CM | POA: Insufficient documentation

## 2023-05-01 DIAGNOSIS — J449 Chronic obstructive pulmonary disease, unspecified: Secondary | ICD-10-CM | POA: Insufficient documentation

## 2023-05-01 DIAGNOSIS — I6523 Occlusion and stenosis of bilateral carotid arteries: Secondary | ICD-10-CM | POA: Diagnosis not present

## 2023-05-01 DIAGNOSIS — Z96653 Presence of artificial knee joint, bilateral: Secondary | ICD-10-CM | POA: Diagnosis not present

## 2023-05-01 DIAGNOSIS — Z7982 Long term (current) use of aspirin: Secondary | ICD-10-CM | POA: Insufficient documentation

## 2023-05-01 DIAGNOSIS — G2581 Restless legs syndrome: Secondary | ICD-10-CM | POA: Insufficient documentation

## 2023-05-01 DIAGNOSIS — Z79899 Other long term (current) drug therapy: Secondary | ICD-10-CM | POA: Diagnosis not present

## 2023-05-01 DIAGNOSIS — Z90722 Acquired absence of ovaries, bilateral: Secondary | ICD-10-CM | POA: Insufficient documentation

## 2023-05-01 DIAGNOSIS — R400 Somnolence: Secondary | ICD-10-CM | POA: Diagnosis not present

## 2023-05-01 DIAGNOSIS — R2990 Unspecified symptoms and signs involving the nervous system: Secondary | ICD-10-CM | POA: Diagnosis present

## 2023-05-01 DIAGNOSIS — M01X71 Direct infection of right ankle and foot in infectious and parasitic diseases classified elsewhere: Secondary | ICD-10-CM | POA: Insufficient documentation

## 2023-05-01 DIAGNOSIS — E559 Vitamin D deficiency, unspecified: Secondary | ICD-10-CM | POA: Insufficient documentation

## 2023-05-01 DIAGNOSIS — M19071 Primary osteoarthritis, right ankle and foot: Secondary | ICD-10-CM | POA: Diagnosis not present

## 2023-05-01 DIAGNOSIS — M009 Pyogenic arthritis, unspecified: Secondary | ICD-10-CM | POA: Insufficient documentation

## 2023-05-01 DIAGNOSIS — R2689 Other abnormalities of gait and mobility: Secondary | ICD-10-CM

## 2023-05-01 DIAGNOSIS — R29818 Other symptoms and signs involving the nervous system: Principal | ICD-10-CM | POA: Insufficient documentation

## 2023-05-01 DIAGNOSIS — L03039 Cellulitis of unspecified toe: Secondary | ICD-10-CM | POA: Diagnosis not present

## 2023-05-01 DIAGNOSIS — R4789 Other speech disturbances: Principal | ICD-10-CM

## 2023-05-01 DIAGNOSIS — Z8616 Personal history of COVID-19: Secondary | ICD-10-CM | POA: Diagnosis not present

## 2023-05-01 DIAGNOSIS — K589 Irritable bowel syndrome without diarrhea: Secondary | ICD-10-CM | POA: Insufficient documentation

## 2023-05-01 DIAGNOSIS — H538 Other visual disturbances: Secondary | ICD-10-CM

## 2023-05-01 HISTORY — DX: Transient cerebral ischemic attack, unspecified: G45.9

## 2023-05-01 LAB — CBC
HCT: 42.4 % (ref 36.0–46.0)
Hemoglobin: 14.1 g/dL (ref 12.0–15.0)
MCH: 27.9 pg (ref 26.0–34.0)
MCHC: 33.3 g/dL (ref 30.0–36.0)
MCV: 84 fL (ref 80.0–100.0)
Platelets: 270 10*3/uL (ref 150–400)
RBC: 5.05 MIL/uL (ref 3.87–5.11)
RDW: 14 % (ref 11.5–15.5)
WBC: 8.6 10*3/uL (ref 4.0–10.5)
nRBC: 0 % (ref 0.0–0.2)

## 2023-05-01 LAB — DIFFERENTIAL
Abs Immature Granulocytes: 0.05 10*3/uL (ref 0.00–0.07)
Basophils Absolute: 0.1 10*3/uL (ref 0.0–0.1)
Basophils Relative: 1 %
Eosinophils Absolute: 0.2 10*3/uL (ref 0.0–0.5)
Eosinophils Relative: 2 %
Immature Granulocytes: 1 %
Lymphocytes Relative: 22 %
Lymphs Abs: 1.9 10*3/uL (ref 0.7–4.0)
Monocytes Absolute: 0.9 10*3/uL (ref 0.1–1.0)
Monocytes Relative: 10 %
Neutro Abs: 5.6 10*3/uL (ref 1.7–7.7)
Neutrophils Relative %: 64 %

## 2023-05-01 LAB — COMPREHENSIVE METABOLIC PANEL
ALT: 50 U/L — ABNORMAL HIGH (ref 0–44)
AST: 29 U/L (ref 15–41)
Albumin: 4.5 g/dL (ref 3.5–5.0)
Alkaline Phosphatase: 84 U/L (ref 38–126)
Anion gap: 10 (ref 5–15)
BUN: 22 mg/dL (ref 8–23)
CO2: 28 mmol/L (ref 22–32)
Calcium: 9.5 mg/dL (ref 8.9–10.3)
Chloride: 99 mmol/L (ref 98–111)
Creatinine, Ser: 0.84 mg/dL (ref 0.44–1.00)
GFR, Estimated: >60 mL/min (ref >60)
Glucose, Bld: 119 mg/dL — ABNORMAL HIGH (ref 70–99)
Potassium: 3.5 mmol/L (ref 3.5–5.1)
Sodium: 137 mmol/L (ref 135–145)
Total Bilirubin: 0.5 mg/dL (ref 0.0–1.2)
Total Protein: 7.4 g/dL (ref 6.5–8.1)

## 2023-05-01 LAB — ETHANOL: Alcohol, Ethyl (B): <10 mg/dL (ref <10)

## 2023-05-01 LAB — CBG MONITORING, ED: Glucose-Capillary: 106 mg/dL — ABNORMAL HIGH (ref 70–99)

## 2023-05-01 LAB — PROTIME-INR
INR: 1 (ref 0.8–1.2)
Prothrombin Time: 13.4 s (ref 11.4–15.2)

## 2023-05-01 LAB — APTT: aPTT: 28 s (ref 24–36)

## 2023-05-01 MED ORDER — SODIUM CHLORIDE 0.9% FLUSH: 3.0000 mL | INTRAVENOUS | Status: DC | PRN

## 2023-05-01 MED ORDER — ASPIRIN 81 MG PO TBEC
81.0000 mg | DELAYED_RELEASE_TABLET | Freq: Every day | ORAL | Status: DC
  Administered 2023-05-01: 81 mg via ORAL
  Filled 2023-05-01: qty 1

## 2023-05-01 MED ORDER — ACETAMINOPHEN 325 MG PO TABS
650.0000 mg | ORAL_TABLET | ORAL | Status: DC | PRN
  Administered 2023-05-02: 650 mg via ORAL
  Filled 2023-05-01: qty 2

## 2023-05-01 MED ORDER — SODIUM CHLORIDE 0.9% FLUSH: 3.0000 mL | Freq: Once | INTRAVENOUS | Status: DC

## 2023-05-01 MED ORDER — ACETAMINOPHEN 650 MG RE SUPP: 650.0000 mg | RECTAL | Status: DC | PRN

## 2023-05-01 MED ORDER — SODIUM CHLORIDE 0.9% FLUSH
3.0000 mL | Freq: Two times a day (BID) | INTRAVENOUS | Status: DC
  Administered 2023-05-01 – 2023-05-02 (×2): 3 mL via INTRAVENOUS

## 2023-05-01 MED ORDER — SODIUM CHLORIDE 0.9 % IV SOLN
2.0000 g | INTRAVENOUS | Status: DC
  Administered 2023-05-01: 2 g via INTRAVENOUS
  Filled 2023-05-01: qty 20

## 2023-05-01 MED ORDER — ALBUTEROL SULFATE (2.5 MG/3ML) 0.083% IN NEBU: 2.5000 mg | INHALATION_SOLUTION | Freq: Four times a day (QID) | RESPIRATORY_TRACT | Status: DC | PRN

## 2023-05-01 MED ORDER — STROKE: EARLY STAGES OF RECOVERY BOOK: Freq: Once | Status: AC

## 2023-05-01 MED ORDER — SENNOSIDES-DOCUSATE SODIUM 8.6-50 MG PO TABS: 1.0000 | ORAL_TABLET | Freq: Every evening | ORAL | Status: DC | PRN

## 2023-05-01 MED ORDER — ROPINIROLE HCL 1 MG PO TABS
4.0000 mg | ORAL_TABLET | Freq: Every day | ORAL | Status: DC
  Administered 2023-05-01: 4 mg via ORAL
  Filled 2023-05-01: qty 4

## 2023-05-01 MED ORDER — ONDANSETRON HCL 4 MG/2ML IJ SOLN
4.0000 mg | Freq: Four times a day (QID) | INTRAMUSCULAR | Status: DC | PRN
  Administered 2023-05-02: 4 mg via INTRAVENOUS
  Filled 2023-05-01: qty 2

## 2023-05-01 MED ORDER — HYDRALAZINE HCL 20 MG/ML IJ SOLN: 5.0000 mg | Freq: Four times a day (QID) | INTRAMUSCULAR | Status: DC | PRN

## 2023-05-01 MED ORDER — ACETAMINOPHEN 160 MG/5ML PO SOLN: 650.0000 mg | ORAL | Status: DC | PRN

## 2023-05-01 MED ORDER — PREGABALIN 75 MG PO CAPS
300.0000 mg | ORAL_CAPSULE | Freq: Every day | ORAL | Status: DC
  Administered 2023-05-01: 300 mg via ORAL
  Filled 2023-05-01: qty 4

## 2023-05-01 NOTE — Assessment & Plan Note (Signed)
 -  Does not appear to be in acute exacerbation

## 2023-05-01 NOTE — ED Triage Notes (Signed)
 Pt to ED POV for possible TIA. Pt saw PCP at 3pm today for routine appt and told to come here. States at 1pm she had episode of difficulty finding words, bilateral blurry vision, felt "groggy", and felt off balance which lasted for 15-20 minutes.  Hx TIAs seen on MRI.   No arm drift, dysarthria, no facial droop or trouble finding words at this time in triage. Does not take blood thinners. Takes 81mg  ASA nightly.  CBG 106.

## 2023-05-01 NOTE — Assessment & Plan Note (Signed)
 Does not appear to be in acute exacerbation Albuterol nebulizer every 6 hours as needed for shortness of breath and wheezing, 3 days ordered

## 2023-05-01 NOTE — Assessment & Plan Note (Addendum)
     X-ray 2 view of the right foot ordered If needed, a.m. team to consider MRI and consult podiatry as appropriate Ceftriaxone 2 g IV daily, 5 days ordered on admission

## 2023-05-01 NOTE — Assessment & Plan Note (Signed)
 MRI of the brain ordered, if MR is positive for stroke, neurology will be consulted Fasting lipid and A1c ordered Permissive hypertension hydralazine 5 mg IV every 6 hours as needed for SBP greater than 180, 48 hours ordered Frequent neuro vascular checks N.p.o. pending swallow study PT, OT, SLP Fall precaution

## 2023-05-01 NOTE — Assessment & Plan Note (Signed)
 Lyrica 300 mg nightly resumed Pending further med reconciliation in the a.m.

## 2023-05-01 NOTE — H&P (Signed)
 History and Physical   Erin Petersen ZHY:[id number] DOB: [birth year] DOA: 05/01/2023  PCP: Judy Pimple, MD  Outpatient Specialists: Dr. Myer Haff, neurosurgery Patient coming from: Outpatient neurology clinic  I have personally briefly reviewed patient's old medical records in [facility] EMR.  Chief Concern: Word finding, balance, blurred vision  HPI: [name] is a 83 year old female with history of COPD, CAD, restless leg syndrome, hyperlipidemia, neuropathy, history of iron deficiency anemia, hypertension, history of multiple TIAs, who presents to emergency for chief concerns of  word finding difficulty, balance issue and blurred vision.    Patient was sent from outpatient neurology, Dr. Sherryll Burger, for chief concerns of possible TIA versus stroke workup.  Vitals in the ED showed temperature of 98, respiration of 18, heart rate 71, blood pressure 129/83, SpO2 96% on room air.  Serum sodium is 137, potassium 3.5, chloride 99, bicarb 28, BUN 22, serum creatinine of 0.84, eGFR greater than 60, nonfasting blood glucose 119, WBC 8.6, hemoglobin 14.1, platelets of 270.  ED treatment: None --------------------------------- At bedside, patient able to tell me her first and last name, age, location, current calendar year.  She reports that she had difficulty finding words, balance issue, difficulty focusing her vision and towards the end of these symptoms, she was seen by her neurologist because she had an outpatient appointment.  She was then sent to the ED.  She reports these symptoms are similar to prior episodes of TIA however this time is more mild.  She reports this lasted approximately 3 to 4 hours.  She endorses taking aspirin 81 mg every evening and is compliant.  She also reports right toe pain with concerns of infection and she is pending appointment with podiatry.  She denies fever, chills, chest pain, dysuria, hematuria, diarrhea, blood in her stool.  Social history: Lives  at home on her own.  She denies tobacco, EtOH, recreational drug use.  ROS: Constitutional: no weight change, no fever ENT/Mouth: no sore throat, no rhinorrhea Eyes: no eye pain, + vision changes, + difficulty focusing Cardiovascular: no chest pain, no dyspnea,  no edema, no palpitations Respiratory: no cough, no sputum, no wheezing Gastrointestinal: no nausea, no vomiting, no diarrhea, no constipation Genitourinary: no urinary incontinence, no dysuria, no hematuria Musculoskeletal: no arthralgias, no myalgias Skin: no skin lesions, no pruritus, Neuro: + weakness, no loss of consciousness, no syncope Psych: no anxiety, no depression, + decrease appetite Heme/Lymph: no bruising, no bleeding  ED Course: Discussed with EDP, patient requiring hospitalization for chief concerns of strokelike symptoms.  Assessment/Plan  Principal Problem:   Stroke-like symptom Active Problems:   Benign essential hypertension   Neuropathy   OSA on CPAP   Restless legs syndrome   Vitamin D deficiency   Asthma   S/P BSO (bilateral salpingo-oophorectomy)   S/P TKR (total knee replacement) using cement, right   IBS (irritable bowel syndrome)   GERD (gastroesophageal reflux disease)   Somnolence, daytime   S/P TKR (total knee replacement) using cement, left   Hyperlipidemia   Mild pulmonary hypertension (HCC)   Chronic obstructive pulmonary disease (HCC)   Pain in right toe(s)   Inflammation of interphalangeal joint of right toe due to infection (HCC)   Assessment and Plan:  * Stroke-like symptom MRI of the brain ordered, if MR is positive for stroke, neurology will be consulted Fasting lipid and A1c ordered Permissive hypertension hydralazine 5 mg IV every 6 hours as needed for SBP greater than 180, 48 hours ordered Frequent neuro vascular  checks N.p.o. pending swallow study PT, OT, SLP Fall precaution  Inflammation of interphalangeal joint of right toe due to infection (HCC)     X-ray 2  view of the right foot ordered If needed, a.m. team to consider MRI and consult podiatry as appropriate Ceftriaxone 2 g IV daily, 5 days ordered on admission  Pain in right toe(s) With concerns for abscess infection X-ray of the right foot, 2 views ordered on admission  Chronic obstructive pulmonary disease (HCC) Does not appear to be in acute exacerbation Albuterol nebulizer every 6 hours as needed for shortness of breath and wheezing, 3 days ordered  Asthma Does not appear to be in acute exacerbation  OSA on CPAP CPAP nightly ordered  Neuropathy Lyrica 300 mg nightly resumed Pending further med reconciliation in the a.m.  Chart reviewed.   DVT prophylaxis: TED hose Code Status: Full code Diet: per RN via secure chat, patient passed bedside swallow exam and is requesting food Family Communication: A phone call has been offered, patient declined stating that her daughter already knows she is being admitted to the hospital Disposition Plan: Pending clinical course; MRI Consults called: None at this time; AM team to consult neurology pending MRI Admission status: Telemetry medical, observation  Past Medical History:  Diagnosis Date   Anemia    Arthritis    Asthma    Bacteremia due to Gram-negative bacteria 06/04/2019   CAD (coronary artery disease) 07/21/2016   a.) LHC 07/21/2016: mild nonobstructive CAD   Colon polyps    Complication of anesthesia    a.) anesthesia awareness   Compression fracture of L1 lumbar vertebra (HCC)    COPD (chronic obstructive pulmonary disease) (HCC)    Diastolic dysfunction    a.) TTE 03/02/2021: EF >55%, mild LVH, mild LAE, triv MR, mild TR/PR, G1DD   Duodenal ulcer    Dyspnea    GERD (gastroesophageal reflux disease)    History of 2019 novel coronavirus disease (COVID-19) 01/19/2021   a.) tested (+) at [facility]   History of lumbar fusion    a.) T12-S1   Hyperlipidemia    Hypertension    Insomnia    a.) takes melatonin PRN   OSA  on CPAP    Osteopenia    Peripheral neuropathy    Plantar fasciitis    Pneumonia    Pulmonary hypertension (HCC)    a.) TTE 06/04/2019: moderate (PASP 40.3 mmHg)   Right inguinal hernia    a.) s/p repair 12/2016   RLS (restless legs syndrome)    a.) on ropinirole   SOB (shortness of breath)    T12 compression fracture (HCC)    TIA (transient ischemic attack)    pt unsure how many, had MRIs here showing this   Umbilical hernia    a.) s/p repair 12/2016   Urinary incontinence    Urinary tract infection    Wears hearing aid in both ears    Past Surgical History:  Procedure Laterality Date   ABDOMINAL HYSTERECTOMY  1972   APPENDECTOMY  1958   APPLICATION OF INTRAOPERATIVE CT SCAN N/A 04/17/2022   Procedure: APPLICATION OF INTRAOPERATIVE CT SCAN;  Surgeon: Venetia Night, MD;  Location: [facility] OR[facility];  Service: Neurosurgery;  Laterality: N/A;   BILATERAL SALPINGOOPHORECTOMY  01/07/2018   BIOPSY  04/15/2019   Procedure: BIOPSY;  Surgeon: Toney Reil, MD;  Location: [facility];  Service: Endoscopy;;   CARDIAC CATHETERIZATION Left 07/21/2016   COLONOSCOPY WITH PROPOFOL N/A 11/11/2018   Procedure: COLONOSCOPY WITH PROPOFOL;  Surgeon: Toney Reil, MD;  Location[facility];  Service: Gastroenterology;  Laterality: N/A;   CYSTO WITH HYDRODISTENSION N/A 12/30/2018   Procedure: CYSTOSCOPY/HYDRODISTENSION WITH MARCAINE;  Surgeon: Alfredo Martinez, MD;  Location: [facility];  Service: Urology;  Laterality: N/A;   ESOPHAGOGASTRODUODENOSCOPY (EGD) WITH PROPOFOL N/A 04/15/2019   Procedure: ESOPHAGOGASTRODUODENOSCOPY (EGD) WITH PROPOFOL;  Surgeon: Toney Reil, MD;  Location: [facility];  Service: Endoscopy;  Laterality: N/A;  sleep apnea   ESOPHAGOGASTRODUODENOSCOPY (EGD) WITH PROPOFOL N/A 07/09/2019   Procedure: ESOPHAGOGASTRODUODENOSCOPY (EGD) WITH PROPOFOL;  Surgeon: Toney Reil, MD;  Location: [facility] ENDOSCOPY;  Service: Gastroenterology;   Laterality: N/A;   ESOPHAGOGASTRODUODENOSCOPY (EGD) WITH PROPOFOL N/A 06/15/2022   Procedure: ESOPHAGOGASTRODUODENOSCOPY (EGD) WITH PROPOFOL;  Surgeon: Toney Reil, MD;  Location: [facility];  Service: Endoscopy;  Laterality: N/A;  sleep apnea   EYE SURGERY Bilateral    INGUINAL HERNIA REPAIR Bilateral 01/05/2017   JOINT REPLACEMENT     LAPAROSCOPIC BILATERAL SALPINGO OOPHERECTOMY  2019   LUMBAR FUSION  2002   LUMBAR FUSION  11/14/2021   Dr Sharolyn Douglas   LUMBAR FUSION  12/14/2021   Dr Malachy Chamber   SPINAL FUSION  2002   TONSILLECTOMY AND ADENOIDECTOMY  1961   TOTAL KNEE ARTHROPLASTY Right 01/01/2020   Procedure: Right total knee arthroplasty - [place] Neighbor to Assist;  Surgeon: Kennedy Bucker, MD;  Location: [facility];  Service: Orthopedics;  Laterality: Right;   TOTAL KNEE ARTHROPLASTY Left 10/12/2020   Procedure: TOTAL KNEE ARTHROPLASTY;  Surgeon: Kennedy Bucker, MD;  Location: [facility];  Service: Orthopedics;  Laterality: Left;   TUBAL LIGATION     UMBILICAL HERNIA REPAIR N/A 01/05/2017   Social History:  reports that she has never smoked. She has never used smokeless tobacco. She reports that she does not currently use alcohol. She reports that she does not use drugs.  Allergies  Allergen Reactions   Bee Venom Anaphylaxis and Hives   Fosamax [Alendronate] Nausea Only    Stomach pain   Fusion Plus [Iron-Fa-B Cmp-C-Biot-Probiotic] Nausea Only    Stomach pain   Ciprofloxacin Hcl Rash    Mild rash after long term use   Sulfa Antibiotics Rash    Bactrim   Family History  Problem Relation Age of Onset   Alcohol abuse Mother    Cancer Mother    Stroke Mother    Alcohol abuse Father    Arthritis Sister    Arthritis Daughter    COPD Daughter    Arthritis Paternal Grandmother    Asthma Paternal Grandmother    COPD Paternal Grandmother    Breast cancer Paternal Aunt    Family history: Family history reviewed and not pertinent.  Prior to Admission medications    Medication Sig Start Date End Date Taking? Authorizing Provider  albuterol (VENTOLIN HFA) 108 (90 Base) MCG/ACT inhaler Inhale 2 puffs into the lungs every 6 (six) hours as needed for wheezing or shortness of breath. 10/24/22   Tower, Audrie Gallus, MD  amitriptyline (ELAVIL) 25 MG tablet TAKE 1 TABLET AT BEDTIME 01/01/23   Tower, Marne A, MD  amLODipine (NORVASC) 5 MG tablet Take 1 tablet (5 mg total) by mouth daily. 02/05/23   Tower, Audrie Gallus, MD  aspirin 81 MG EC tablet Take 81 mg by mouth at bedtime.     [provider]  celecoxib (CELEBREX) 200 MG capsule Take 1 capsule (200 mg total) by mouth daily. 03/22/23 03/21/24  Venetia Night, MD  Cholecalciferol (VITAMIN D3) 50  MCG (2000 UT) TABS Take 2,000 Units by mouth daily.    [provider]  Cyanocobalamin (VITAMIN B-12) 1000 MCG SUBL Take 2,000 mcg by mouth daily.    [provider]  EPINEPHrine 0.3 mg/0.3 mL IJ SOAJ injection Inject 0.3 mg into the muscle as needed for anaphylaxis. 11/07/22   Tower, Audrie Gallus, MD  fluticasone (FLONASE) 50 MCG/ACT nasal spray Place 2 sprays into both nostrils daily. qhs 03/20/23   Tower, Audrie Gallus, MD  hydrochlorothiazide (HYDRODIURIL) 25 MG tablet TAKE 1 TABLET DAILY 11/27/22   Tower, Audrie Gallus, MD  Melatonin 10 MG CAPS Take 1 capsule by mouth as needed.    [provider]  methocarbamol (ROBAXIN) 500 MG tablet Take 1 tablet (500 mg total) by mouth every 6 (six) hours as needed for muscle spasms. 03/22/23   Venetia Night, MD  mupirocin ointment (BACTROBAN) 2 % Apply topically 2 (two) times daily. 11/09/22   Karie Schwalbe, MD  omeprazole (PRILOSEC) 40 MG capsule Take 1 capsule (40 mg total) by mouth daily. 03/01/23 02/24/24  Celso Sameena Artus, PA-C  ondansetron (ZOFRAN-ODT) 4 MG disintegrating tablet Take 1 tablet (4 mg total) by mouth every 8 (eight) hours as needed for nausea or vomiting. 08/04/22   Tower, Audrie Gallus, MD  polyethylene glycol (MIRALAX / GLYCOLAX) 17 g packet Take 17 g by mouth  daily. 03/01/23   Celso Domnique Vantine, PA-C  pregabalin (LYRICA) 150 MG capsule Take one pill by mouth in the am and 2 in the pm 02/27/23   Tower, Audrie Gallus, MD  rOPINIRole (REQUIP) 1 MG tablet Take 1 mg by mouth in the morning and at bedtime. 1 in am and 1 at 3pm    [provider]  rOPINIRole (REQUIP) 4 MG tablet TAKE 1 TABLET AT BEDTIME 03/08/21   Tower, Audrie Gallus, MD  simvastatin (ZOCOR) 20 MG tablet TAKE 1 TABLET AT BEDTIME 01/16/23   Tower, Marne A, MD  tiZANidine (ZANAFLEX) 4 MG tablet Take 4 mg by mouth 3 (three) times daily. 06/12/22   [provider]  traMADol (ULTRAM) 50 MG tablet Take 1 tablet (50 mg total) by mouth every 12 (twelve) hours as needed for severe pain. 08/04/22   Judy Pimple, MD   Physical Exam: Vitals:   05/01/23 1641 05/01/23 1850 05/01/23 1930 05/01/23 2051  BP:  (!) 162/67 (!) 166/69 (!) 192/81  Pulse:  70 68 70  Resp:  16 13 16   Temp:   98.1 F (36.7 C) 97.9 F (36.6 C)  TempSrc:   Oral   SpO2:  99% 96% 100%  Weight: 80.7 kg     Height: 4\' 11"  (1.499 m)      Constitutional: appears age-appropriate, NAD, calm Eyes: PERRL, lids and conjunctivae normal ENMT: Mucous membranes are moist. Posterior pharynx clear of any exudate or lesions. Age-appropriate dentition. Hearing appropriate Neck: normal, supple, no masses, no thyromegaly Respiratory: clear to auscultation bilaterally, no wheezing, no crackles. Normal respiratory effort. No accessory muscle use.  Cardiovascular: Regular rate and rhythm, no murmurs / rubs / gallops. No extremity edema. 2+ pedal pulses. No carotid bruits.  Abdomen: Obese abdomen, no tenderness, no masses palpated, no hepatosplenomegaly. Bowel sounds positive.  Musculoskeletal: no clubbing / cyanosis. No joint deformity upper and lower extremities. Good ROM, no contractures, no atrophy. Normal muscle tone.  Skin: no rashes, lesions, ulcers. No induration      Neurologic: Sensation intact. Strength 5/5 in all 4.  Psychiatric:  Normal judgment and insight. Alert and oriented  x 3. Normal mood.   EKG: independently reviewed, showing sinus rhythm with rate of 70, QTc 440  Chest x-ray on Admission: I personally reviewed and I agree with radiologist reading as below.  CT HEAD WO CONTRAST Result Date: 05/01/2023 CLINICAL DATA:  Transient ischemic attack EXAM: CT HEAD WITHOUT CONTRAST TECHNIQUE: Contiguous axial images were obtained from the base of the skull through the vertex without intravenous contrast. RADIATION DOSE REDUCTION: This exam was performed according to the departmental dose-optimization program which includes automated exposure control, adjustment of the mA and/or kV according to patient size and/or use of iterative reconstruction technique. COMPARISON:  None Available. FINDINGS: Brain: No mass,hemorrhage or extra-axial collection. Normal appearance of the parenchyma and CSF spaces. Vascular: Atherosclerotic calcification of the vertebral and internal carotid arteries at the skull base. No abnormal hyperdensity of the major intracranial arteries or dural venous sinuses. Skull: The visualized skull base, calvarium and extracranial soft tissues are normal. Sinuses/Orbits: No fluid levels or advanced mucosal thickening of the visualized paranasal sinuses. No mastoid or middle ear effusion. Normal orbits. Other: None. IMPRESSION: No acute intracranial abnormality. Electronically Signed   By: Deatra Robinson M.D.   On: 05/01/2023 18:28   Labs on Admission: I have personally reviewed following labs  CBC: Recent Labs  Lab 05/01/23 1641  WBC 8.6  NEUTROABS 5.6  HGB 14.1  HCT 42.4  MCV 84.0  PLT 270   Basic Metabolic Panel: Recent Labs  Lab 05/01/23 1641  NA 137  K 3.5  CL 99  CO2 28  GLUCOSE 119*  BUN 22  CREATININE 0.84  CALCIUM 9.5   GFR: Estimated Creatinine Clearance: 48.3 mL/min (by C-G formula based on SCr of 0.84 mg/dL).  Liver Function Tests: Recent Labs  Lab 05/01/23 1641  AST 29  ALT 50*   ALKPHOS 84  BILITOT 0.5  PROT 7.4  ALBUMIN 4.5   Coagulation Profile: Recent Labs  Lab 05/01/23 1845  INR 1.0   CBG: Recent Labs  Lab 05/01/23 1639  GLUCAP 106*   Urine analysis:    Component Value Date/Time   COLORURINE YELLOW (A) 07/26/2022 1010   APPEARANCEUR CLEAR (A) 07/26/2022 1010   APPEARANCEUR Hazy (A) 02/10/2019 1335   LABSPEC 1.009 07/26/2022 1010   PHURINE 7.0 07/26/2022 1010   GLUCOSEU NEGATIVE 07/26/2022 1010   HGBUR NEGATIVE 07/26/2022 1010   BILIRUBINUR NEGATIVE 07/26/2022 1010   BILIRUBINUR Negative 02/10/2019 1335   KETONESUR NEGATIVE 07/26/2022 1010   PROTEINUR NEGATIVE 07/26/2022 1010   NITRITE NEGATIVE 07/26/2022 1010   LEUKOCYTESUR NEGATIVE 07/26/2022 1010   This document was prepared using Dragon Voice Recognition software and may include unintentional dictation errors.  Dr. Sedalia Muta [facility]  If 7PM-7AM, please contact overnight-coverage provider If 7AM-7PM, please contact day attending provider [email or web address]  05/01/2023, 10:01 PM

## 2023-05-01 NOTE — Assessment & Plan Note (Signed)
 CPAP nightly ordered

## 2023-05-01 NOTE — Hospital Course (Addendum)
 Ms. Olena Willy is a 83 year old female with history of COPD, CAD, restless leg syndrome, hyperlipidemia, neuropathy, history of iron deficiency anemia, hypertension, history of multiple TIAs, who presents to emergency for chief concerns of  word finding difficulty, balance issue and blurred vision.    Patient was sent from outpatient neurology, Dr. Sherryll Burger, for chief concerns of possible TIA versus stroke workup.  Vitals in the ED showed temperature of 98, respiration of 18, heart rate 71, blood pressure 129/83, SpO2 96% on room air.  Serum sodium is 137, potassium 3.5, chloride 99, bicarb 28, BUN 22, serum creatinine of 0.84, eGFR greater than 60, nonfasting blood glucose 119, WBC 8.6, hemoglobin 14.1, platelets of 270.  ED treatment: None

## 2023-05-01 NOTE — Assessment & Plan Note (Signed)
 With concerns for abscess infection X-ray of the right foot, 2 views ordered on admission

## 2023-05-01 NOTE — ED Provider Notes (Signed)
 [facility] Provider Note   Event Date/Time   First MD Initiated Contact with Patient 05/01/23 1842     (approximate) History  Transient Ischemic Attack  HPI Erin Petersen is a 83 y.o. female with a past medical history of multiple TIAs, COPD, hyperlipidemia, and hypertension who presents from her neurologist's office after symptoms beginning at noon today of difficulty word finding, blurred vision in the left eye, and balance instability.  Patient states that she initially felt the symptoms but was able to go to sleep however when she woke up she continued to have the symptoms including when she presented to the neurologist office today.  Dr. Sherryll Burger recommended she be evaluated further given her history of TIAs in the past.  ROS: Patient currently denies any vision changes, tinnitus, difficulty speaking, facial droop, sore throat, chest pain, shortness of breath, abdominal pain, nausea/vomiting/diarrhea, dysuria, or weakness/numbness/paresthesias in any extremity   Physical Exam  Triage Vital Signs: ED Triage Vitals  Encounter Vitals Group     BP 05/01/23 1639 129/83     Systolic BP Percentile --      Diastolic BP Percentile --      Pulse Rate 05/01/23 1639 71     Resp 05/01/23 1639 18     Temp 05/01/23 1639 98 F (36.7 C)     Temp Source 05/01/23 1639 Oral     SpO2 05/01/23 1639 96 %     Weight 05/01/23 1641 178 lb (80.7 kg)     Height 05/01/23 1641 4\' 11"  (1.499 m)     Head Circumference --      Peak Flow --      Pain Score 05/01/23 1637 0     Pain Loc --      Pain Education --      Exclude from Growth Chart --    Most recent vital signs: Vitals:   05/01/23 1639 05/01/23 1850  BP: 129/83 (!) 162/67  Pulse: 71 70  Resp: 18 16  Temp: 98 F (36.7 C)   SpO2: 96% 99%   General: Awake, oriented x4. CV:  Good peripheral perfusion.  Resp:  Normal effort.  Abd:  No distention.  Other:  Elderly obese Caucasian female resting comfortably in no acute  distress.  NIHSS 0 ED Results / Procedures / Treatments  Labs (all labs ordered are listed, but only abnormal results are displayed) Labs Reviewed  COMPREHENSIVE METABOLIC PANEL - Abnormal; Notable for the following components:      Result Value   Glucose, Bld 119 (*)    ALT 50 (*)    All other components within normal limits  CBG MONITORING, ED - Abnormal; Notable for the following components:   Glucose-Capillary 106 (*)    All other components within normal limits  CBC  DIFFERENTIAL  ETHANOL  PROTIME-INR  APTT   EKG ED ECG REPORT I, Merwyn Katos, the attending physician, personally viewed and interpreted this ECG. Date: 05/01/2023 EKG Time: 1635 Rate: 70 Rhythm: normal sinus rhythm QRS Axis: normal Intervals: normal ST/T Wave abnormalities: normal Narrative Interpretation: no evidence of acute ischemia RADIOLOGY ED MD interpretation: CT of the head without contrast interpreted by me shows no evidence of acute abnormalities including no intracerebral hemorrhage, obvious masses, or significant edema -Agree with radiology assessment Official radiology report(s): CT HEAD WO CONTRAST Result Date: 05/01/2023 CLINICAL DATA:  Transient ischemic attack EXAM: CT HEAD WITHOUT CONTRAST TECHNIQUE: Contiguous axial images were obtained from the base of the skull  through the vertex without intravenous contrast. RADIATION DOSE REDUCTION: This exam was performed according to the departmental dose-optimization program which includes automated exposure control, adjustment of the mA and/or kV according to patient size and/or use of iterative reconstruction technique. COMPARISON:  None Available. FINDINGS: Brain: No mass,hemorrhage or extra-axial collection. Normal appearance of the parenchyma and CSF spaces. Vascular: Atherosclerotic calcification of the vertebral and internal carotid arteries at the skull base. No abnormal hyperdensity of the major intracranial arteries or dural venous sinuses.  Skull: The visualized skull base, calvarium and extracranial soft tissues are normal. Sinuses/Orbits: No fluid levels or advanced mucosal thickening of the visualized paranasal sinuses. No mastoid or middle ear effusion. Normal orbits. Other: None. IMPRESSION: No acute intracranial abnormality. Electronically Signed   By: Deatra Robinson M.D.   On: 05/01/2023 18:28   PROCEDURES: Critical Care performed: No .1-3 Lead EKG Interpretation  Performed by: Merwyn Katos, MD Authorized by: Merwyn Katos, MD     Interpretation: normal     ECG rate:  71   ECG rate assessment: normal     Rhythm: sinus rhythm     Ectopy: none     Conduction: normal    MEDICATIONS ORDERED IN ED: Medications  sodium chloride flush (NS) 0.9 % injection 3 mL (3 mLs Intravenous Not Given 05/01/23 1839)   IMPRESSION / MDM / ASSESSMENT AND PLAN / ED COURSE  I reviewed the triage vital signs and the nursing notes.                             The patient is on the cardiac monitor to evaluate for evidence of arrhythmia and/or significant heart rate changes. Patient's presentation is most consistent with acute presentation with potential threat to life or bodily function. Patient is a 83 year old female who presents with symptoms concerning for TIA PMH risk factors: Hypercholesterolemia, hypertension, previous TIAs Neurologic Deficits: Blurred vision, aphasia, balance instability Last known Well Time: 1230 NIH Stroke Score: Currently 0 Given History and Exam I have lower suspicion for infectious etiology, neurologic changes secondary to toxicologic ingestion, seizure, complex migraine. Presentation concerning for possible stroke requiring workup.  Workup: Labs: POC glucose, CBC, BMP, LFTs, Troponin, PT/INR, PTT, Type and Screen Other Diagnostics: ECG, CXR, non-contrast head CT followed by CTA brain and neck (to r/o large vessel occlusion amenable to thrombectomy) Interventions: Patient's not eligible for TPA due to  resolution of symptoms prior to evaluation  Consult: hospitalist Disposition: Admit   FINAL CLINICAL IMPRESSION(S) / ED DIAGNOSES   Final diagnoses:  Word finding difficulty  Balance problem  Blurred vision   Rx / DC Orders   ED Discharge Orders     None      Note:  This document was prepared using Dragon voice recognition software and may include unintentional dictation errors.   Merwyn Katos, MD 05/01/23 [id number]

## 2023-05-02 ENCOUNTER — Other Ambulatory Visit: Payer: Medicare Other

## 2023-05-02 ENCOUNTER — Ambulatory Visit: Payer: Medicare Other | Admitting: Family Medicine

## 2023-05-02 ENCOUNTER — Observation Stay

## 2023-05-02 DIAGNOSIS — I639 Cerebral infarction, unspecified: Secondary | ICD-10-CM | POA: Diagnosis not present

## 2023-05-02 DIAGNOSIS — R2990 Unspecified symptoms and signs involving the nervous system: Secondary | ICD-10-CM | POA: Diagnosis not present

## 2023-05-02 DIAGNOSIS — R29818 Other symptoms and signs involving the nervous system: Secondary | ICD-10-CM | POA: Diagnosis not present

## 2023-05-02 LAB — LIPID PANEL
Cholesterol: 125 mg/dL (ref 0–200)
HDL: 34 mg/dL — ABNORMAL LOW (ref >40)
LDL Cholesterol: 68 mg/dL (ref 0–99)
Total CHOL/HDL Ratio: 3.7 ratio
Triglycerides: 113 mg/dL (ref <150)
VLDL: 23 mg/dL (ref 0–40)

## 2023-05-02 LAB — HEMOGLOBIN A1C
Hgb A1c MFr Bld: 6.1 % — ABNORMAL HIGH (ref 4.8–5.6)
Mean Plasma Glucose: 128.37 mg/dL

## 2023-05-02 MED ORDER — SIMVASTATIN 20 MG PO TABS: 20.0000 mg | ORAL_TABLET | Freq: Every day | ORAL | Status: DC

## 2023-05-02 MED ORDER — AMOXICILLIN-POT CLAVULANATE 875-125 MG PO TABS: 1.0000 | ORAL_TABLET | Freq: Two times a day (BID) | ORAL | 0 refills | Status: DC

## 2023-05-02 MED ORDER — TIZANIDINE HCL 4 MG PO TABS
4.0000 mg | ORAL_TABLET | Freq: Three times a day (TID) | ORAL | Status: DC
  Filled 2023-05-02: qty 1

## 2023-05-02 MED ORDER — AMLODIPINE BESYLATE 5 MG PO TABS: 5.0000 mg | ORAL_TABLET | Freq: Every day | ORAL | Status: DC

## 2023-05-02 MED ORDER — PREGABALIN 75 MG PO CAPS: 150.0000 mg | ORAL_CAPSULE | Freq: Two times a day (BID) | ORAL | Status: DC

## 2023-05-02 MED ORDER — ALBUTEROL SULFATE (2.5 MG/3ML) 0.083% IN NEBU: 3.0000 mL | INHALATION_SOLUTION | Freq: Four times a day (QID) | RESPIRATORY_TRACT | Status: DC | PRN

## 2023-05-02 MED ORDER — PREGABALIN 75 MG PO CAPS: 300.0000 mg | ORAL_CAPSULE | Freq: Every evening | ORAL | Status: DC

## 2023-05-02 MED ORDER — HYDROCHLOROTHIAZIDE 25 MG PO TABS: 25.0000 mg | ORAL_TABLET | Freq: Every day | ORAL | Status: DC

## 2023-05-02 MED ORDER — ROPINIROLE HCL 1 MG PO TABS: 1.0000 mg | ORAL_TABLET | Freq: Two times a day (BID) | ORAL | Status: DC

## 2023-05-02 MED ORDER — PREGABALIN 75 MG PO CAPS: 150.0000 mg | ORAL_CAPSULE | Freq: Every morning | ORAL | Status: DC

## 2023-05-02 MED ORDER — AMITRIPTYLINE HCL 25 MG PO TABS: 25.0000 mg | ORAL_TABLET | Freq: Every day | ORAL | Status: DC

## 2023-05-02 NOTE — Discharge Summary (Signed)
 Physician Discharge Summary  Erin Petersen:[id number] DOB: 1941-01-06 DOA: 05/01/2023  PCP: Judy Pimple, MD  Admit date: 05/01/2023 Discharge date: 05/02/2023  Admitted From: Home Disposition: Home (home health PT and OT ordered, patient declined)  Recommendations for Outpatient Follow-up:  Follow up with PCP in 1-2 weeks Follow-up neurology as outpatient  Home Health: No Equipment/Devices: None  Discharge Condition: Stable CODE STATUS: Full Diet recommendation: Regular  Brief/Interim Summary:  83 year old female with history of COPD, CAD, restless leg syndrome, hyperlipidemia, neuropathy, history of iron deficiency anemia, hypertension, history of multiple TIAs, who presents to emergency for chief concerns of  word finding difficulty, balance issue and blurred vision.     Patient was sent from outpatient neurology, Dr. Sherryll Burger, for chief concerns of possible TIA versus stroke workup.  Stroke workup unrevealing.  Suspect possible TIA.  Patient back to baseline at time of discharge.  Home health PT recommended however patient declined at this time.  She can follow-up with her outpatient physicians for home services later should she choose to.    Discharge Diagnoses:  Principal Problem:   Stroke-like symptom Active Problems:   Benign essential hypertension   Neuropathy   OSA on CPAP   Restless legs syndrome   Vitamin D deficiency   Asthma   S/P BSO (bilateral salpingo-oophorectomy)   S/P TKR (total knee replacement) using cement, right   IBS (irritable bowel syndrome)   GERD (gastroesophageal reflux disease)   Somnolence, daytime   S/P TKR (total knee replacement) using cement, left   Hyperlipidemia   Mild pulmonary hypertension (HCC)   Chronic obstructive pulmonary disease (HCC)   Pain in right toe(s)   Inflammation of interphalangeal joint of right toe due to infection (HCC)  Strokelike symptoms Unclear etiology.  Possible TIA.  Patient on aspirin 81 mg daily.   Will continue.  Normotensive blood pressure goal.  Stable for discharge.  Will follow-up outpatient neurology and PCP.  Soft tissue infection right foot Low suspicion for deep tissue infection or osteomyelitis.  Plain film x-rays are reassuring.  Will recommend short course of oral antibiotics and follow-up outpatient with podiatry.   Discharge Instructions  Discharge Instructions     Diet - low sodium heart healthy   Complete by: As directed    Increase activity slowly   Complete by: As directed       Allergies as of 05/02/2023       Reactions   Bee Venom Anaphylaxis, Hives   Fosamax [alendronate] Nausea Only   Stomach pain   Fusion Plus [iron-fa-b Cmp-c-biot-probiotic] Nausea Only   Stomach pain   Ciprofloxacin Hcl Rash   Mild rash after long term use   Sulfa Antibiotics Rash   Bactrim        Medication List     STOP taking these medications    celecoxib 200 MG capsule Commonly known as: CeleBREX   methocarbamol 500 MG tablet Commonly known as: ROBAXIN   omeprazole 40 MG capsule Commonly known as: PRILOSEC   ondansetron 4 MG disintegrating tablet Commonly known as: ZOFRAN-ODT   traMADol 50 MG tablet Commonly known as: ULTRAM       TAKE these medications    albuterol 108 (90 Base) MCG/ACT inhaler Commonly known as: VENTOLIN HFA Inhale 2 puffs into the lungs every 6 (six) hours as needed for wheezing or shortness of breath.   amitriptyline 25 MG tablet Commonly known as: ELAVIL TAKE 1 TABLET AT BEDTIME   amLODipine 5 MG tablet Commonly known as:  NORVASC Take 1 tablet (5 mg total) by mouth daily.   amoxicillin-clavulanate 875-125 MG tablet Commonly known as: AUGMENTIN Take 1 tablet by mouth 2 (two) times daily for 5 days.   aspirin EC 81 MG tablet Take 81 mg by mouth at bedtime.   EPINEPHrine 0.3 mg/0.3 mL Soaj injection Commonly known as: EPI-PEN Inject 0.3 mg into the muscle as needed for anaphylaxis.   fluticasone 50 MCG/ACT nasal  spray Commonly known as: FLONASE Place 2 sprays into both nostrils daily. qhs   hydrochlorothiazide 25 MG tablet Commonly known as: HYDRODIURIL TAKE 1 TABLET DAILY   Melatonin 10 MG Caps Take 1 capsule by mouth as needed.   mupirocin ointment 2 % Commonly known as: BACTROBAN Apply topically 2 (two) times daily.   polyethylene glycol 17 g packet Commonly known as: MIRALAX / GLYCOLAX Take 17 g by mouth daily.   pregabalin 150 MG capsule Commonly known as: Lyrica Take one pill by mouth in the am and 2 in the pm   rOPINIRole 1 MG tablet Commonly known as: REQUIP Take 1 mg by mouth in the morning and at bedtime. 1 in am and 1 at 3pm   rOPINIRole 4 MG tablet Commonly known as: REQUIP TAKE 1 TABLET AT BEDTIME   simvastatin 20 MG tablet Commonly known as: ZOCOR TAKE 1 TABLET AT BEDTIME   tiZANidine 4 MG tablet Commonly known as: ZANAFLEX Take 4 mg by mouth 3 (three) times daily.   Vitamin B-12 1000 MCG Subl Take 2,000 mcg by mouth daily.   Vitamin D3 50 MCG (2000 UT) Tabs Take 2,000 Units by mouth daily.        Allergies  Allergen Reactions   Bee Venom Anaphylaxis and Hives   Fosamax [Alendronate] Nausea Only    Stomach pain   Fusion Plus [Iron-Fa-B Cmp-C-Biot-Probiotic] Nausea Only    Stomach pain   Ciprofloxacin Hcl Rash    Mild rash after long term use   Sulfa Antibiotics Rash    Bactrim    Consultations: None   Procedures/Studies: MR BRAIN WO CONTRAST Result Date: 05/02/2023 CLINICAL DATA:  Neuro deficit, acute, stroke suspected EXAM: MRI HEAD WITHOUT CONTRAST TECHNIQUE: Multiplanar, multiecho pulse sequences of the brain and surrounding structures were obtained without intravenous contrast. COMPARISON:  CT head May 01, 2023. MRI head August 05, 2022. FINDINGS: Brain: No acute infarction, hemorrhage, hydrocephalus, extra-axial collection or mass lesion. Small remote infarct in the left corona radiata. Vascular: Major arterial flow voids are maintained at  the skull base. Skull and upper cervical spine: Normal marrow signal. Sinuses/Orbits: Clear sinuses.  No acute orbital findings. Other: No mastoid effusions. IMPRESSION: No evidence of acute intracranial abnormality. Electronically Signed   By: Feliberto Harts M.D.   On: 05/02/2023 03:42   DG Foot 2 Views Right Result Date: 05/02/2023 CLINICAL DATA:  Great toe infection EXAM: RIGHT FOOT - 2 VIEW COMPARISON:  04/02/2023 FINDINGS: No fracture or malalignment. No soft tissue emphysema. Negative for osseous destructive change. Mild degenerative change at the first MTP joint. IMPRESSION: No acute osseous abnormality Electronically Signed   By: Jasmine Pang M.D.   On: 05/02/2023 01:35   CT HEAD WO CONTRAST Result Date: 05/01/2023 CLINICAL DATA:  Transient ischemic attack EXAM: CT HEAD WITHOUT CONTRAST TECHNIQUE: Contiguous axial images were obtained from the base of the skull through the vertex without intravenous contrast. RADIATION DOSE REDUCTION: This exam was performed according to the departmental dose-optimization program which includes automated exposure control, adjustment of the mA and/or kV  according to patient size and/or use of iterative reconstruction technique. COMPARISON:  None Available. FINDINGS: Brain: No mass,hemorrhage or extra-axial collection. Normal appearance of the parenchyma and CSF spaces. Vascular: Atherosclerotic calcification of the vertebral and internal carotid arteries at the skull base. No abnormal hyperdensity of the major intracranial arteries or dural venous sinuses. Skull: The visualized skull base, calvarium and extracranial soft tissues are normal. Sinuses/Orbits: No fluid levels or advanced mucosal thickening of the visualized paranasal sinuses. No mastoid or middle ear effusion. Normal orbits. Other: None. IMPRESSION: No acute intracranial abnormality. Electronically Signed   By: Deatra Robinson M.D.   On: 05/01/2023 18:28      Subjective: Seen and examined on the day  of discharge.  Stable no distress.  Back to baseline level of functioning.  Stable for discharge home.  Discharge Exam: Vitals:   05/02/23 0841 05/02/23 1139  BP: 139/70 (!) 147/68  Pulse: 75 65  Resp: 16 16  Temp: 98.3 F (36.8 C) 98.1 F (36.7 C)  SpO2: 98% 97%   Vitals:   05/02/23 0003 05/02/23 0417 05/02/23 0841 05/02/23 1139  BP:  (!) 146/66 139/70 (!) 147/68  Pulse:  63 75 65  Resp:  16 16 16   Temp: 98.1 F (36.7 C) 98 F (36.7 C) 98.3 F (36.8 C) 98.1 F (36.7 C)  TempSrc: Oral Oral  Oral  SpO2:  99% 98% 97%  Weight:      Height:        General: Pt is alert, awake, not in acute distress Cardiovascular: RRR, S1/S2 +, no rubs, no gallops Respiratory: CTA bilaterally, no wheezing, no rhonchi Abdominal: Soft, NT, ND, bowel sounds + Extremities: no edema, no cyanosis    The results of significant diagnostics from this hospitalization (including imaging, microbiology, ancillary and laboratory) are listed below for reference.     Microbiology: No results found for this or any previous visit (from the past 240 hours).   Labs: BNP (last 3 results) No results for input(s): "BNP" in the last 8760 hours. Basic Metabolic Panel: Recent Labs  Lab 05/01/23 1641  NA 137  K 3.5  CL 99  CO2 28  GLUCOSE 119*  BUN 22  CREATININE 0.84  CALCIUM 9.5   Liver Function Tests: Recent Labs  Lab 05/01/23 1641  AST 29  ALT 50*  ALKPHOS 84  BILITOT 0.5  PROT 7.4  ALBUMIN 4.5   No results for input(s): "LIPASE", "AMYLASE" in the last 168 hours. No results for input(s): "AMMONIA" in the last 168 hours. CBC: Recent Labs  Lab 05/01/23 1641  WBC 8.6  NEUTROABS 5.6  HGB 14.1  HCT 42.4  MCV 84.0  PLT 270   Cardiac Enzymes: No results for input(s): "CKTOTAL", "CKMB", "CKMBINDEX", "TROPONINI" in the last 168 hours. BNP: Invalid input(s): "POCBNP" CBG: Recent Labs  Lab 05/01/23 1639  GLUCAP 106*   D-Dimer No results for input(s): "DDIMER" in the last 72  hours. Hgb A1c Recent Labs    05/02/23 0521  HGBA1C 6.1*   Lipid Profile Recent Labs    05/02/23 0521  CHOL 125  HDL 34*  LDLCALC 68  TRIG 161  CHOLHDL 3.7   Thyroid function studies No results for input(s): "TSH", "T4TOTAL", "T3FREE", "THYROIDAB" in the last 72 hours.  Invalid input(s): "FREET3" Anemia work up No results for input(s): "VITAMINB12", "FOLATE", "FERRITIN", "TIBC", "IRON", "RETICCTPCT" in the last 72 hours. Urinalysis    Component Value Date/Time   COLORURINE YELLOW (A) 07/26/2022 1010   APPEARANCEUR CLEAR (  A) 07/26/2022 1010   APPEARANCEUR Hazy (A) 02/10/2019 1335   LABSPEC 1.009 07/26/2022 1010   PHURINE 7.0 07/26/2022 1010   GLUCOSEU NEGATIVE 07/26/2022 1010   HGBUR NEGATIVE 07/26/2022 1010   BILIRUBINUR NEGATIVE 07/26/2022 1010   BILIRUBINUR Negative 02/10/2019 1335   KETONESUR NEGATIVE 07/26/2022 1010   PROTEINUR NEGATIVE 07/26/2022 1010   NITRITE NEGATIVE 07/26/2022 1010   LEUKOCYTESUR NEGATIVE 07/26/2022 1010   Sepsis Labs Recent Labs  Lab 05/01/23 1641  WBC 8.6   Microbiology No results found for this or any previous visit (from the past 240 hours).   Time coordinating discharge: Over 30 minutes  SIGNED:   Tresa Moore, MD  [facility] 05/02/2023, 3:50 PM Pager   If 7PM-7AM, please contact night-coverage

## 2023-05-02 NOTE — Plan of Care (Signed)
  Problem: Education: Goal: Knowledge of disease or condition will improve Outcome: Progressing   Problem: Ischemic Stroke/TIA Tissue Perfusion: Goal: Complications of ischemic stroke/TIA will be minimized Outcome: Progressing   Problem: Health Behavior/Discharge Planning: Goal: Goals will be collaboratively established with patient/family Outcome: Progressing   Problem: Education: Goal: Knowledge of General Education information will improve Description: Including pain rating scale, medication(s)/side effects and non-pharmacologic comfort measures Outcome: Progressing

## 2023-05-02 NOTE — Evaluation (Signed)
 Physical Therapy Evaluation Patient Details Name: Erin Petersen MRN: [id number] DOB: 02/23/1941 Today's Date: 05/02/2023  History of Present Illness  Erin Petersen is a 83 year old female with history of COPD, CAD, restless leg syndrome, hyperlipidemia, neuropathy, history of iron deficiency anemia, hypertension, history of multiple TIAs, who presents to emergency for chief concerns of  word finding difficulty, balance issue and blurred vision.  Clinical Impression  Pt showed good effort and was able to perform most tasks w/o issue.  She did have some mild limp/unsteadiness due to ongoing R ankle and toe issues, was safe to ambulate longer distance with walker (did some shorter walking w/o AD).  Pt reports feeling close to her baseline and did relatively well, however displayed some hesitancy and need for increased time/cuing, etc.  Pt reports she has had multiple falls in the last 6 month, will benefit from continued PT to address safety and functional issues.        If plan is discharge home, recommend the following: Assist for transportation;Assistance with cooking/housework   Can travel by private vehicle        Equipment Recommendations None recommended by PT  Recommendations for Other Services       Functional Status Assessment Patient has had a recent decline in their functional status and demonstrates the ability to make significant improvements in function in a reasonable and predictable amount of time.     Precautions / Restrictions Precautions Precautions: Fall Recall of Precautions/Restrictions: Intact Restrictions Weight Bearing Restrictions Per Provider Order: No      Mobility  Bed Mobility Overal bed mobility: Independent                  Transfers Overall transfer level: Modified independent Equipment used: None, Rolling walker (2 wheels)               General transfer comment: Pt able to rise to standing with and w/o AD, maintains balance  with good relative confidence    Ambulation/Gait Ambulation/Gait assistance: Supervision Gait Distance (Feet): 250 Feet Assistive device: Rolling walker (2 wheels), None         General Gait Details: Pt with mild (recent baseline) limp on R due to ongoing ankle issue but was safe and confident with prolonged bout of ambulation.  No LOBs or overt safety issues, not overly reliant on the walker and able to do some safe ambulation w/o AD, however PT encouraging continued walker use.  Stairs            Wheelchair Mobility     Tilt Bed    Modified Rankin (Stroke Patients Only)       Balance Overall balance assessment: Needs assistance Sitting-balance support: No upper extremity supported, Feet supported Sitting balance-Leahy Scale: Good     Standing balance support: No upper extremity supported, Single extremity supported Standing balance-Leahy Scale: Good Standing balance comment: Pt was able to maintain static and minimal challenge dynamic balance tasks without c/o dizziness, unsteadiness or other concerns                             Pertinent Vitals/Pain Pain Assessment Pain Assessment: No/denies pain    Home Living Family/patient expects to be discharged to:: Private residence Living Arrangements: Alone Available Help at Discharge: Family Type of Home: House Home Access: Level entry       Home Layout: One level Home Equipment: Rollator (4 wheels);Cane - single point;Shower seat - built in  Prior Function Prior Level of Function : Independent/Modified Independent;Driving             Mobility Comments: Amb with rollator PRN ADLs Comments: ModI/I in all ADLs/IADLs, driving     Extremity/Trunk Assessment   Upper Extremity Assessment Upper Extremity Assessment: Overall WFL for tasks assessed;Generalized weakness    Lower Extremity Assessment Lower Extremity Assessment: Overall WFL for tasks assessed;Generalized weakness        Communication   Communication Communication: No apparent difficulties    Cognition Arousal: Alert Behavior During Therapy: WFL for tasks assessed/performed                             Following commands: Intact       Cueing Cueing Techniques: Verbal cues     General Comments General comments (skin integrity, edema, etc.): Pt reporting consistently that she feels essentially back to her recent baseline    Exercises     Assessment/Plan    PT Assessment Patient needs continued PT services  PT Problem List Decreased strength;Decreased balance;Decreased activity tolerance;Decreased safety awareness;Decreased knowledge of use of DME;Pain       PT Treatment Interventions Gait training;Functional mobility training;Therapeutic activities;Therapeutic exercise;Balance training;Neuromuscular re-education;Patient/family education    PT Goals (Current goals can be found in the Care Plan section)  Acute Rehab PT Goals Patient Stated Goal: Go home PT Goal Formulation: With patient Time For Goal Achievement: 05/15/23 Potential to Achieve Goals: Good    Frequency Min 1X/week     Co-evaluation               AM-PAC PT "6 Clicks" Mobility  Outcome Measure Help needed turning from your back to your side while in a flat bed without using bedrails?: None Help needed moving from lying on your back to sitting on the side of a flat bed without using bedrails?: None Help needed moving to and from a bed to a chair (including a wheelchair)?: None Help needed standing up from a chair using your arms (e.g., wheelchair or bedside chair)?: None Help needed to walk in hospital room?: None Help needed climbing 3-5 steps with a railing? : A Little 6 Click Score: 23    End of Session Equipment Utilized During Treatment: Gait belt Activity Tolerance: Patient tolerated treatment well Patient left: with chair alarm set;with call bell/phone within reach Nurse Communication:  Mobility status PT Visit Diagnosis: History of falling (Z91.81);Pain;Other symptoms and signs involving the nervous system (R29.898) Pain - Right/Left: Right Pain - part of body: Ankle and joints of foot    Time: 8657-8469 PT Time Calculation (min) (ACUTE ONLY): 20 min   Charges:   PT Evaluation $PT Eval Low Complexity: 1 Low PT Treatments $Gait Training: 8-22 mins PT General Charges $$ ACUTE PT VISIT: 1 Visit         Malachi Pro, DPT 05/02/2023, 11:36 AM

## 2023-05-02 NOTE — Progress Notes (Signed)
 Occupational Therapy Evaluation Patient Details Name: Erin Petersen MRN: [id number] DOB: Sep 10, 1940 Today's Date: 05/02/2023   History of Present Illness   [name] is a 83 year old female with history of COPD, CAD, restless leg syndrome, hyperlipidemia, neuropathy, history of iron deficiency anemia, hypertension, history of multiple TIAs, who presents to emergency for chief concerns of  word finding difficulty, balance issue and blurred vision.     Clinical Impressions Pt seen for OT evaluation this date. Pt lives alone, in a one level home with children living locally, able to assist at d/c PRN. Pt was reports MOD I/independent with mobility, ADL, and IADL prior to admission. Pt currently presents with some visual deficits (difficulty when focusing when looking far away, reporting unclear vision in peripheral vision). Unable to complete vision assessment during eval due to pt reporting increased dizziness and lightheadedness at rest and when sitting up. Will continue to assess visual deficits at next available date. Pt completed LB dressing at EOB, MOD physical assistance required to donn socks. Pt reported LB dressing is difficult at baseline. Pt reports no further changes from baseline. Deferred OOB mobility/ADLs to next available date when lightheadedness and dizziness subside. Pt strength, ROM, and sensation are at baseline/WFL. Pt would benefit from skilled OT services to address noted impairments and functional limitations (see below for any additional details) in order to maximize safety and independence while minimizing falls risk and caregiver burden. OT will follow acutely.    If plan is discharge home, recommend the following:   A little help with walking and/or transfers;A little help with bathing/dressing/bathroom;Help with stairs or ramp for entrance;Assistance with cooking/housework     Functional Status Assessment   Patient has had a recent decline in their  functional status and demonstrates the ability to make significant improvements in function in a reasonable and predictable amount of time.     Equipment Recommendations   None recommended by OT     Recommendations for Other Services         Precautions/Restrictions   Precautions Precautions: Fall Recall of Precautions/Restrictions: Intact Restrictions Weight Bearing Restrictions Per Provider Order: No     Mobility Bed Mobility Overal bed mobility: Independent                  Transfers Overall transfer level: Modified independent Equipment used: None               General transfer comment: OOB t/f limited due to dizziness, will continue to assess abilities at next date      Balance Overall balance assessment: Needs assistance Sitting-balance support: No upper extremity supported, Feet supported Sitting balance-Leahy Scale: Good Sitting balance - Comments: Seated on EOB for ~53mins for majority of eval with no LOB noted   Standing balance support:  (Will continue to assess at next date.)                               ADL either performed or assessed with clinical judgement   ADL Overall ADL's : Needs assistance/impaired (Will continue to assess OOB ADLs when pt dizziness subsides.)     Grooming: Brushing hair;Sitting;Supervision/safety (EOB)               Lower Body Dressing: Moderate assistance;Sit to/from stand               Functional mobility during ADLs:  (Pt OOB mobility limited on this date due to levels  of dizziness. Will continue to assess at next available date.) General ADL Comments: Pt OOB ADL participation limited on this date due to level of dizziness. Completed LB dressing while seated on EOB, pt required MOD assist to doff sock, stated usually completes dressing while seated on toilet becasue it is lower to the ground. Has LB dressing difficulties at baseline due to lower back deficits.     Vision  Baseline Vision/History: 1 Wears glasses Patient Visual Report: Other (comment) (Pt reported having difficulty focusing vision when looking far away. Able to read board but reported efforfull.) Vision Assessment?: Yes Eye Alignment: Within Functional Limits Additional Comments: Unable to complete vision assissment on this date, pt reports "too dizzy to follow finger." Will further assess vision at next available date.     Perception Perception: Not tested (Will continue to assess OOB mobility when pt dizziness subsides.)       Praxis Praxis: Not tested (Will continue to assess OOB mobility when pt dizziness subsides.)       Pertinent Vitals/Pain Pain Assessment Pain Assessment: No/denies pain (Pt reports feeling dizzy and lightheaded, flexing neck down feels better for pt when sitting up)     Extremity/Trunk Assessment Upper Extremity Assessment Upper Extremity Assessment: Generalized weakness   Lower Extremity Assessment Lower Extremity Assessment: Defer to PT evaluation;Generalized weakness (RLE anterior aspect of distal extermity affected by chronic neuropathy. Big toe on R foot, swollen and painfull at rest - RN aware)       Communication Communication Communication: No apparent difficulties   Cognition Arousal: Alert Behavior During Therapy: WFL for tasks assessed/performed Cognition: No apparent impairments             OT - Cognition Comments: Pt A/O x4 throughout session                 Following commands: Intact       Cueing  General Comments   Cueing Techniques: Verbal cues  Pt consistantly reports of dizziness, RN aware   Exercises Exercises: Other exercises Other Exercises Other Exercises: Edu: role of OT, safe ADL completion for LB dressing dispite lower back deficits   Shoulder Instructions      Home Living Family/patient expects to be discharged to:: Private residence Living Arrangements: Alone Available Help at Discharge: Family  (Children live local, able to assist PRN) Type of Home: House Home Access: Level entry     Home Layout: One level     Bathroom Shower/Tub: Producer, television/film/video: Standard Bathroom Accessibility: Yes How Accessible: Accessible via walker Home Equipment: Rollator (4 wheels);Cane - single point;Shower seat - built in          Prior Functioning/Environment Prior Level of Function : Independent/Modified Independent;Driving             Mobility Comments: Amb with rollator PRN ADLs Comments: ModI/I in all ADLs/IADLs, driving    OT Problem List: Impaired balance (sitting and/or standing);Decreased activity tolerance;Impaired vision/perception   OT Treatment/Interventions: Self-care/ADL training;Energy conservation;DME and/or AE instruction;Therapeutic activities;Visual/perceptual remediation/compensation;Patient/family education;Balance training      OT Goals(Current goals can be found in the care plan section)   Acute Rehab OT Goals Patient Stated Goal: to feel better OT Goal Formulation: With patient Time For Goal Achievement: 05/16/23 Potential to Achieve Goals: Good ADL Goals Pt Will Perform Grooming: with modified independence;standing Pt Will Perform Lower Body Dressing: with modified independence;with adaptive equipment;sit to/from stand Pt Will Transfer to Toilet: with modified independence;ambulating Pt Will Perform Toileting - Clothing  Manipulation and hygiene: with modified independence   OT Frequency:  Min 1X/week    Co-evaluation              AM-PAC OT "6 Clicks" Daily Activity     Outcome Measure Help from another person eating meals?: None Help from another person taking care of personal grooming?: A Little Help from another person toileting, which includes using toliet, bedpan, or urinal?: A Little Help from another person bathing (including washing, rinsing, drying)?: A Little Help from another person to put on and taking off  regular upper body clothing?: None Help from another person to put on and taking off regular lower body clothing?: A Lot 6 Click Score: 19   End of Session    Activity Tolerance:  (Treatment limited secondary to pt reports of dizziness and lightheadedness) Patient left: in bed;with call bell/phone within reach;with bed alarm set  OT Visit Diagnosis: Unsteadiness on feet (R26.81);Other abnormalities of gait and mobility (R26.89);Muscle weakness (generalized) (M62.81)                Time: 1610-9604 OT Time Calculation (min): 15 min Charges:  OT General Charges $OT Visit: 1 Visit OT Evaluation $OT Eval Low Complexity: 1 Low  Glenard Haring M.S. OTR/L  05/02/23, 12:27 PM

## 2023-05-02 NOTE — TOC Progression Note (Signed)
 Transition of Care [facility]) - Progression Note    Patient Details  Name: Erin Petersen MRN: [id number] Date of Birth: 1940-03-02  [facility] CM/SW Contact  Erin Sons, [place] Phone Number: 05/02/2023, 12:53 PM  Clinical Narrative:      Met with pt to discuss PT rec. Pt does not want HH or OPPT; states she feels okay without them. No DME recommended by PT/OT. RN and MD notified.       Expected Discharge Plan and Services         Expected Discharge Date: 05/02/23                                     Social Determinants of Health (SDOH) Interventions SDOH Screenings   Food Insecurity: No Food Insecurity (05/02/2023)  Housing: Low Risk  (05/02/2023)  Transportation Needs: No Transportation Needs (05/02/2023)  Utilities: Not At Risk (05/02/2023)  Alcohol Screen: Low Risk  (10/02/2022)  Depression (PHQ2-9): Low Risk  (10/24/2022)  Recent Concern: Depression (PHQ2-9) - Medium Risk (08/02/2022)  Financial Resource Strain: Medium Risk (04/02/2023)  Physical Activity: Inactive (04/02/2023)  Social Connections: Patient Declined (05/02/2023)  Stress: No Stress Concern Present (04/02/2023)  Tobacco Use: Low Risk  (05/01/2023)  Health Literacy: Adequate Health Literacy (10/02/2022)    Readmission Risk Interventions     No data to display

## 2023-05-02 NOTE — Progress Notes (Signed)
 SLP Cancellation Note  Patient Details Name: Erin BOHNSACK MRN: [id number] DOB: 1941/02/19   Cancelled treatment:       Reason Eval/Treat Not Completed: SLP screened, no needs identified, will sign off (SLP consult received and appreciated. Chart review completed. Work up, thus far, negative for stroke. Spoke with pt and daughter. Both deny any current SLP needs. Speech fluent, appropriate, and without s/sx dysarthria or anomia. SLP to sign off.)  Time spent with pt: [id number] Woodroe Chen 05/02/2023, 8:31 AM

## 2023-05-04 ENCOUNTER — Ambulatory Visit: Admitting: Family Medicine

## 2023-05-04 ENCOUNTER — Encounter: Payer: Self-pay | Admitting: Family Medicine

## 2023-05-04 ENCOUNTER — Encounter: Payer: Self-pay | Admitting: *Deleted

## 2023-05-04 VITALS — BP 130/70 | HR 83 | Temp 98.2°F | Ht 59.0 in | Wt 177.0 lb

## 2023-05-04 DIAGNOSIS — Z6835 Body mass index (BMI) 35.0-35.9, adult: Secondary | ICD-10-CM

## 2023-05-04 DIAGNOSIS — E6609 Other obesity due to excess calories: Secondary | ICD-10-CM | POA: Insufficient documentation

## 2023-05-04 DIAGNOSIS — E66812 Obesity, class 2: Secondary | ICD-10-CM

## 2023-05-04 DIAGNOSIS — L089 Local infection of the skin and subcutaneous tissue, unspecified: Secondary | ICD-10-CM | POA: Insufficient documentation

## 2023-05-04 DIAGNOSIS — I10 Essential (primary) hypertension: Secondary | ICD-10-CM

## 2023-05-04 DIAGNOSIS — G4733 Obstructive sleep apnea (adult) (pediatric): Secondary | ICD-10-CM | POA: Diagnosis not present

## 2023-05-04 DIAGNOSIS — R7303 Prediabetes: Secondary | ICD-10-CM | POA: Diagnosis not present

## 2023-05-04 DIAGNOSIS — G43909 Migraine, unspecified, not intractable, without status migrainosus: Secondary | ICD-10-CM

## 2023-05-04 DIAGNOSIS — K760 Fatty (change of) liver, not elsewhere classified: Secondary | ICD-10-CM

## 2023-05-04 DIAGNOSIS — G459 Transient cerebral ischemic attack, unspecified: Secondary | ICD-10-CM | POA: Insufficient documentation

## 2023-05-04 NOTE — Assessment & Plan Note (Signed)
 With OSA and prediabetes and fatty liver  Discussed how this problem influences overall health and the risks it imposes  Reviewed plan for weight loss with lower calorie diet (via better food choices (lower glycemic and portion control) along with exercise building up to or more than 30 minutes 5 days per week including some aerobic activity and strength training   Has failed diet and exercise  Discussed GLP-1 as option  Disc option of GLP medication including possible side effects like GI intolerance and risk of thyroid and endocrine cancer, pancreatitis and gallstones, kidney problems and diabetic retinopathy  Once current acute issues are resolved (TIA, toe infection) may try and see if we can get zepbound covered by ins

## 2023-05-04 NOTE — Assessment & Plan Note (Signed)
 bp in fair control at this time  BP Readings from Last 1 Encounters:  05/04/23 130/70  In setting of recent hosp for possible TIA  No changes needed Most recent labs reviewed  Disc lifstyle change with low sodium diet and exercise  Plan to continue Amlofipine 5 mg daily Hydrochlorothiazide 25 mg daily   Planning follow up with neuro

## 2023-05-04 NOTE — Assessment & Plan Note (Signed)
 Recent hosp for possible TIA Reviewed hospital records, lab results and studies in detail    No changes on imaging  Symptoms are resolved  On asa 81 mg daily  Needs carotid doppler likely and possible echo with neuro follow up Ref urgent to follow up with Dr Norm Salt neurologist   Call back and Er precautions noted in detail today   Strict ER precaution / 911 if symptoms return  Normal exam today, feels back to normal

## 2023-05-04 NOTE — Assessment & Plan Note (Signed)
 May be candidate for generic zepbound  Lab Results  Component Value Date   ALT 50 (H) 05/01/2023   AST 29 05/01/2023   ALKPHOS 84 05/01/2023   BILITOT 0.5 05/01/2023    Has worked on weight loss with diet and exercise   Once current acute health problems (? TIa and foot infx) are resolved may try to get it covered

## 2023-05-04 NOTE — Progress Notes (Signed)
 Subjective:    Patient ID: Erin Petersen, female    DOB: 1940-08-04, 83 y.o.   MRN: [id number]  HPI  Wt Readings from Last 3 Encounters:  05/04/23 177 lb (80.3 kg)  05/01/23 178 lb (80.7 kg)  04/10/23 178 lb 3.2 oz (80.8 kg)   35.75 kg/m  Vitals:   05/04/23 1151 05/04/23 1235  BP: (!) 146/84 130/70  Pulse: 83   Temp: 98.2 F (36.8 C)   SpO2: 98%    Pt presents for follow up of hospitalization  Also to discuss weight loss   Hosp from 3/4 to 3/5 for suspected TIA/ stroke like symptoms  Also had soft tissue infection of right foot - taking augmentin   Pt says she had episode 6 months ago  Dr Sherryll Burger sent her     Back to baseline at discharge  Placed on asa 81 mg daily   MR brain-normal  CT head normal  Foot xray right -no acute osseus abn  HTN bp is stable today  No cp or palpitations or headaches or edema  No side effects to medicines  BP Readings from Last 3 Encounters:  05/04/23 130/70  05/02/23 (!) 147/68  04/10/23 133/63    Amlodipine 5 mg daily  Hydrochlorothiazide 25 mg daily   Does have history of migraine  Does have history of mild pulm HTN  History of OSA  Lab Results  Component Value Date   HGBA1C 6.1 (H) 05/02/2023   HGBA1C 5.9 (H) 08/04/2022   HGBA1C 6.3 01/31/2022    Does not think toe is better   Lab Results  Component Value Date   NA 137 05/01/2023   K 3.5 05/01/2023   CO2 28 05/01/2023   GLUCOSE 119 (H) 05/01/2023   BUN 22 05/01/2023   CREATININE 0.84 05/01/2023   CALCIUM 9.5 05/01/2023   GFR 76.06 10/24/2022   GFRNONAA >60 05/01/2023   Lab Results  Component Value Date   ALT 50 (H) 05/01/2023   AST 29 05/01/2023   ALKPHOS 84 05/01/2023   BILITOT 0.5 05/01/2023   Lab Results  Component Value Date   WBC 8.6 05/01/2023   HGB 14.1 05/01/2023   HCT 42.4 05/01/2023   MCV 84.0 05/01/2023   PLT 270 05/01/2023   Lab Results  Component Value Date   CHOL 125 05/02/2023   HDL 34 (L) 05/02/2023   LDLCALC 68  05/02/2023   LDLDIRECT 90.0 08/10/2020   TRIG 113 05/02/2023   CHOLHDL 3.7 05/02/2023  Zocor 20    Obesity  Has tried  Low carb diet  Mediterranean diet  Weight watchers- gained  High protein diet with shakes   Exercises - floor work (in bed) Chair program  Exercise bands  Exercise ball  Toe raises  Walking Exercise bike 3 days per week   Most hungry at 9-10 pm  Portion control is very bad         Patient Active Problem List   Diagnosis Date Noted   Fatty liver 05/04/2023   Infection of great toe 05/04/2023   TIA (transient ischemic attack) 05/04/2023   Class 2 obesity due to excess calories with body mass index (BMI) of 35.0 to 35.9 in adult 05/04/2023   Stroke-like symptom 05/01/2023   Pain in right toe(s) 05/01/2023   Inflammation of interphalangeal joint of right toe due to infection (HCC) 05/01/2023   Pain and swelling of right ankle 04/02/2023   Laceration of left hand 11/09/2022   Headache 08/02/2022  Epigastric pain 08/02/2022   History of duodenal ulcer 06/15/2022   Pseudoarthrosis of thoracic spine after fusion procedure 04/17/2022   Other secondary kyphosis, thoracic region 04/17/2022   Compression fracture of T12 vertebra (HCC) 04/17/2022   Spinal instability, thoracic 04/17/2022   Abdominal wall bulge 01/31/2022   Iron deficiency anemia 01/31/2022   Constipation due to pain medication 12/28/2021   Post-operative pain 12/28/2021   Back pain at L4-L5 level 12/01/2021   Inguinal hernia of right side without obstruction or gangrene 11/15/2021   Umbilical hernia without obstruction or gangrene 11/15/2021   Other secondary scoliosis, lumbar region 10/04/2021   Postlaminectomy syndrome of lumbar region 10/04/2021   Pedal edema 07/26/2021   Mild pulmonary hypertension (HCC) 07/26/2021   Hyperlipidemia 11/17/2020   S/P TKR (total knee replacement) using cement, left 10/12/2020   Somnolence, daytime 09/09/2020   Encounter for screening mammogram for  breast cancer 08/25/2020   Encounter for general adult medical examination with abnormal findings 08/25/2020   GERD (gastroesophageal reflux disease) 08/25/2020   Dizzy spells 08/25/2020   Migraine 03/12/2020   IBS (irritable bowel syndrome) 03/12/2020   S/P TKR (total knee replacement) using cement, right 01/01/2020   Colonic polyp 10/22/2019   History of vaginal hysterectomy 06/11/2019   Estrogen deficiency 04/22/2019   History of skin cancer 04/22/2019   High-tone pelvic floor dysfunction 04/03/2019   IC (interstitial cystitis) 04/03/2019   Neuralgia of both pudendal nerves 04/03/2019   Pelvic pain in female 04/03/2019   Osteoporosis 02/12/2019   Asthma 01/31/2019   Adverse reaction to influenza vaccine, initial encounter 12/03/2018   Vitamin D deficiency 12/03/2018   Restless legs syndrome 11/28/2018   Allergic rhinitis 10/08/2018   Benign essential hypertension 10/08/2018   Neuropathy 10/08/2018   Osteoarthritis 10/08/2018   S/P BSO (bilateral salpingo-oophorectomy) 01/07/2018   OSA on CPAP 12/29/2016   Chronic obstructive pulmonary disease (HCC) 12/29/2016   Chronic dyspnea 06/24/2012   Other and unspecified disc disorder of lumbar region 10/25/2000   Diverticulosis of colon 10/25/2000   Prediabetes 09/24/1998   Tinnitus of both ears 09/24/1983   Past Medical History:  Diagnosis Date   Allergy 1956   Anemia    Arthritis    Asthma    Bacteremia due to Gram-negative bacteria 06/04/2019   Blood transfusion without reported diagnosis February 2024   CAD (coronary artery disease) 07/21/2016   a.) LHC 07/21/2016: mild nonobstructive CAD   Colon polyps    Complication of anesthesia    a.) anesthesia awareness   Compression fracture of L1 lumbar vertebra (HCC)    COPD (chronic obstructive pulmonary disease) (HCC)    Depression    Diastolic dysfunction    a.) TTE 03/02/2021: EF >55%, mild LVH, mild LAE, triv MR, mild TR/PR, G1DD   Duodenal ulcer    Dyspnea    GERD  (gastroesophageal reflux disease)    History of 2019 novel coronavirus disease (COVID-19) 01/19/2021   a.) tested (+) at [facility]   History of lumbar fusion    a.) T12-S1   Hyperlipidemia    Hypertension    Insomnia    a.) takes melatonin PRN   OSA on CPAP    Osteopenia    Peripheral neuropathy    Plantar fasciitis    Pneumonia    Pulmonary hypertension (HCC)    a.) TTE 06/04/2019: moderate (PASP 40.3 mmHg)   Right inguinal hernia    a.) s/p repair 12/2016   RLS (restless legs syndrome)    a.) on ropinirole  Sleep apnea 2009   SOB (shortness of breath)    T12 compression fracture (HCC)    TIA (transient ischemic attack)    pt unsure how many, had MRIs here showing this   Umbilical hernia    a.) s/p repair 12/2016   Urinary incontinence    Urinary tract infection    Wears hearing aid in both ears    Past Surgical History:  Procedure Laterality Date   ABDOMINAL HYSTERECTOMY  1972   APPENDECTOMY  1958   APPLICATION OF INTRAOPERATIVE CT SCAN N/A 04/17/2022   Procedure: APPLICATION OF INTRAOPERATIVE CT SCAN;  Surgeon: Venetia Night, MD;  Location: [facility] ORS;  Service: Neurosurgery;  Laterality: N/A;   BILATERAL SALPINGOOPHORECTOMY  01/07/2018   BIOPSY  04/15/2019   Procedure: BIOPSY;  Surgeon: Toney Reil, MD;  Location: [facility];  Service: Endoscopy;;   CARDIAC CATHETERIZATION Left 07/21/2016   COLONOSCOPY WITH PROPOFOL N/A 11/11/2018   Procedure: COLONOSCOPY WITH PROPOFOL;  Surgeon: Toney Reil, MD;  Location: [facility];  Service: Gastroenterology;  Laterality: N/A;   CYSTO WITH HYDRODISTENSION N/A 12/30/2018   Procedure: CYSTOSCOPY/HYDRODISTENSION WITH MARCAINE;  Surgeon: Alfredo Martinez, MD;  Location: [facility] OR[facility];  Service: Urology;  Laterality: N/A;   ESOPHAGOGASTRODUODENOSCOPY (EGD) WITH PROPOFOL N/A 04/15/2019   Procedure: ESOPHAGOGASTRODUODENOSCOPY (EGD) WITH PROPOFOL;  Surgeon: Toney Reil, MD;  Location: [facility];  Service: Endoscopy;  Laterality: N/A;  sleep apnea   ESOPHAGOGASTRODUODENOSCOPY (EGD) WITH PROPOFOL N/A 07/09/2019   Procedure: ESOPHAGOGASTRODUODENOSCOPY (EGD) WITH PROPOFOL;  Surgeon: Toney Reil, MD;  Location: [facility] ENDOSCOPY;  Service: Gastroenterology;  Laterality: N/A;   ESOPHAGOGASTRODUODENOSCOPY (EGD) WITH PROPOFOL N/A 06/15/2022   Procedure: ESOPHAGOGASTRODUODENOSCOPY (EGD) WITH PROPOFOL;  Surgeon: Toney Reil, MD;  Location: [facility];  Service: Endoscopy;  Laterality: N/A;  sleep apnea   EYE SURGERY Bilateral    INGUINAL HERNIA REPAIR Bilateral 01/05/2017   JOINT REPLACEMENT     LAPAROSCOPIC BILATERAL SALPINGO OOPHERECTOMY  2019   LUMBAR FUSION  2002   LUMBAR FUSION  11/14/2021   Dr Sharolyn Douglas   LUMBAR FUSION  12/14/2021   Dr Malachy Chamber   SPINAL FUSION  2002   TONSILLECTOMY AND ADENOIDECTOMY  1961   TOTAL KNEE ARTHROPLASTY Right 01/01/2020   Procedure: Right total knee arthroplasty - [place] Neighbor to Assist;  Surgeon: Kennedy Bucker, MD;  Location: [facility] ORS;  Service: Orthopedics;  Laterality: Right;   TOTAL KNEE ARTHROPLASTY Left 10/12/2020   Procedure: TOTAL KNEE ARTHROPLASTY;  Surgeon: Kennedy Bucker, MD;  Location: [facility] OR[facility];  Service: Orthopedics;  Laterality: Left;   TUBAL LIGATION     UMBILICAL HERNIA REPAIR N/A 01/05/2017   Social History   Tobacco Use   Smoking status: Never   Smokeless tobacco: Never  Vaping Use   Vaping status: Never Used  Substance Use Topics   Alcohol use: Not Currently   Drug use: Never   Family History  Problem Relation Age of Onset   Alcohol abuse Mother    Cancer Mother    Stroke Mother    Alcohol abuse Father    Cancer Father    Arthritis Sister    Arthritis Daughter    COPD Daughter    Arthritis Paternal Grandmother    Asthma Paternal Grandmother    COPD Paternal Grandmother    Breast cancer Paternal Aunt    Birth defects Daughter    Allergies  Allergen Reactions   Bee Venom Anaphylaxis  and Hives   Fosamax [Alendronate] Nausea Only  Stomach pain   Fusion Plus [Iron-Fa-B Cmp-C-Biot-Probiotic] Nausea Only    Stomach pain   Ciprofloxacin Hcl Rash    Mild rash after long term use   Sulfa Antibiotics Rash    Bactrim   Current Outpatient Medications on File Prior to Visit  Medication Sig Dispense Refill   albuterol (VENTOLIN HFA) 108 (90 Base) MCG/ACT inhaler Inhale 2 puffs into the lungs every 6 (six) hours as needed for wheezing or shortness of breath. 8 g 5   amitriptyline (ELAVIL) 25 MG tablet TAKE 1 TABLET AT BEDTIME 90 tablet 1   amLODipine (NORVASC) 5 MG tablet Take 1 tablet (5 mg total) by mouth daily. 90 tablet 2   amoxicillin-clavulanate (AUGMENTIN) 875-125 MG tablet Take 1 tablet by mouth 2 (two) times daily for 5 days. 10 tablet 0   aspirin 81 MG EC tablet Take 81 mg by mouth at bedtime.      Cholecalciferol (VITAMIN D3) 50 MCG (2000 UT) TABS Take 2,000 Units by mouth daily.     Cyanocobalamin (VITAMIN B-12) 1000 MCG SUBL Take 2,000 mcg by mouth daily.     EPINEPHrine 0.3 mg/0.3 mL IJ SOAJ injection Inject 0.3 mg into the muscle as needed for anaphylaxis. 2 each 0   fluticasone (FLONASE) 50 MCG/ACT nasal spray Place 2 sprays into both nostrils daily. qhs (Patient taking differently: Place 2 sprays into both nostrils daily as needed.) 48 g 0   hydrochlorothiazide (HYDRODIURIL) 25 MG tablet TAKE 1 TABLET DAILY 90 tablet 3   Melatonin 10 MG CAPS Take 1 capsule by mouth as needed.     polyethylene glycol (MIRALAX / GLYCOLAX) 17 g packet Take 17 g by mouth daily.     pregabalin (LYRICA) 150 MG capsule Take one pill by mouth in the am and 2 in the pm 270 capsule 0   rOPINIRole (REQUIP) 1 MG tablet Take 1 mg by mouth in the morning and at bedtime. 1 in am and 1 at 3pm     rOPINIRole (REQUIP) 4 MG tablet TAKE 1 TABLET AT BEDTIME 90 tablet 3   simvastatin (ZOCOR) 20 MG tablet TAKE 1 TABLET AT BEDTIME 90 tablet 3   tiZANidine (ZANAFLEX) 4 MG tablet Take 4 mg by mouth 3  (three) times daily.     No current facility-administered medications on file prior to visit.    Review of Systems  Constitutional:  Positive for fatigue. Negative for activity change, appetite change, fever and unexpected weight change.  HENT:  Negative for congestion, ear pain, rhinorrhea, sinus pressure and sore throat.   Eyes:  Negative for pain, redness and visual disturbance.  Respiratory:  Negative for cough, shortness of breath and wheezing.   Cardiovascular:  Negative for chest pain and palpitations.  Gastrointestinal:  Negative for abdominal pain, blood in stool, constipation and diarrhea.       Obese abdomen causes discomfort   Endocrine: Negative for polydipsia and polyuria.  Genitourinary:  Negative for dysuria, frequency and urgency.  Musculoskeletal:  Negative for arthralgias, back pain and myalgias.  Skin:  Negative for pallor and rash.  Allergic/Immunologic: Negative for environmental allergies.  Neurological:  Negative for dizziness, seizures, syncope, facial asymmetry, speech difficulty, light-headedness, numbness and headaches.  Hematological:  Negative for adenopathy. Does not bruise/bleed easily.  Psychiatric/Behavioral:  Negative for decreased concentration and dysphoric mood. The patient is not nervous/anxious.        Objective:   Physical Exam Constitutional:      General: She is not in acute distress.  Appearance: Normal appearance. She is well-developed. She is obese.  HENT:     Head: Normocephalic and atraumatic.     Mouth/Throat:     Mouth: Mucous membranes are moist.  Eyes:     Conjunctiva/sclera: Conjunctivae normal.     Pupils: Pupils are equal, round, and reactive to light.  Neck:     Thyroid: No thyromegaly.     Vascular: No carotid bruit or JVD.     Comments: No bruits  Cardiovascular:     Rate and Rhythm: Normal rate and regular rhythm.     Heart sounds: Normal heart sounds.     No gallop.  Pulmonary:     Effort: Pulmonary effort is  normal. No respiratory distress.     Breath sounds: Normal breath sounds. No wheezing or rales.  Abdominal:     General: There is no distension or abdominal bruit.     Palpations: Abdomen is soft.     Comments: Obese abdomen noted   Musculoskeletal:     Cervical back: Normal range of motion and neck supple.     Right lower leg: No edema.     Left lower leg: No edema.  Lymphadenopathy:     Cervical: No cervical adenopathy.  Skin:    General: Skin is warm and dry.     Coloration: Skin is not pale.     Findings: No rash.  Neurological:     General: No focal deficit present.     Mental Status: She is alert.     Cranial Nerves: No cranial nerve deficit.     Sensory: No sensory deficit.     Motor: No weakness.     Coordination: Coordination normal.     Gait: Gait normal.     Deep Tendon Reflexes: Reflexes are normal and symmetric. Reflexes normal.  Psychiatric:        Mood and Affect: Mood normal.           Assessment & Plan:   Problem List Items Addressed This Visit       Cardiovascular and Mediastinum   TIA (transient ischemic attack) - Primary   Recent hosp for possible TIA Reviewed hospital records, lab results and studies in detail    No changes on imaging  Symptoms are resolved  On asa 81 mg daily  Needs carotid doppler likely and possible echo with neuro follow up Ref urgent to follow up with Dr Norm Salt neurologist   Call back and Er precautions noted in detail today   Strict ER precaution / 911 if symptoms return  Normal exam today, feels back to normal      Relevant Orders   Ambulatory referral to Neurology   Migraine   Unsure if recent neuro changes could have been atypical migraine Reviewed hospital records, lab results and studies in detail   Will make sure she gets follow up with neurology      Benign essential hypertension   bp in fair control at this time  BP Readings from Last 1 Encounters:  05/04/23 130/70  In setting of recent hosp for  possible TIA  No changes needed Most recent labs reviewed  Disc lifstyle change with low sodium diet and exercise  Plan to continue Amlofipine 5 mg daily Hydrochlorothiazide 25 mg daily   Planning follow up with neuro         Respiratory   OSA on CPAP   Weight loss may help  Generic zepbound may be option in light of both osa  and fatty liver   Once her TIA and foot infection are worked up -may try to get this covered Disc option of GLP medication including possible side effects like GI intolerance and risk of thyroid and endocrine cancer, pancreatitis and gallstones, kidney problems and diabetic retinopathy         Digestive   Fatty liver   May be candidate for generic zepbound  Lab Results  Component Value Date   ALT 50 (H) 05/01/2023   AST 29 05/01/2023   ALKPHOS 84 05/01/2023   BILITOT 0.5 05/01/2023    Has worked on weight loss with diet and exercise   Once current acute health problems (? TIa and foot infx) are resolved may try to get it covered          Other   Prediabetes   Lab Results  Component Value Date   HGBA1C 6.1 (H) 05/02/2023   HGBA1C 5.9 (H) 08/04/2022   HGBA1C 6.3 01/31/2022   disc imp of low glycemic diet and wt loss to prevent DM2       Infection of great toe   Per pt -not much improved with augmentin  Seems to include nail  Reviewed hospital records, lab results and studies in detail    She will make appointment with podiatry asap and call if she needs referral  ? If she may need nail procedure   Call back and Er precautions noted in detail today        Class 2 obesity due to excess calories with body mass index (BMI) of 35.0 to 35.9 in adult   With OSA and prediabetes and fatty liver  Discussed how this problem influences overall health and the risks it imposes  Reviewed plan for weight loss with lower calorie diet (via better food choices (lower glycemic and portion control) along with exercise building up to or more than 30  minutes 5 days per week including some aerobic activity and strength training   Has failed diet and exercise  Discussed GLP-1 as option  Disc option of GLP medication including possible side effects like GI intolerance and risk of thyroid and endocrine cancer, pancreatitis and gallstones, kidney problems and diabetic retinopathy  Once current acute issues are resolved (TIA, toe infection) may try and see if we can get zepbound covered by ins

## 2023-05-04 NOTE — Assessment & Plan Note (Signed)
 Per pt -not much improved with augmentin  Seems to include nail  Reviewed hospital records, lab results and studies in detail    She will make appointment with podiatry asap and call if she needs referral  ? If she may need nail procedure   Call back and Er precautions noted in detail today

## 2023-05-04 NOTE — Patient Instructions (Addendum)
  If symptoms return - go to the ER  Continue the aspirin   Stay active Add some strength training to your routine, this is important for bone and brain health and can reduce your risk of falls and help your body use insulin properly and regulate weight  Light weights, exercise bands , and internet videos are a good way to start  Yoga (chair or regular), machines , floor exercises or a gym with machines are also good options   I put the referral in neurology  If they don't call you today , call their office Monday   Call podiatry about the toe today  If you need a referral let me know   Once toe is better and you have followed up with  neuro -let us know and then I want to try sending in generic for zepbound for weight

## 2023-05-04 NOTE — Assessment & Plan Note (Signed)
 Lab Results  Component Value Date   HGBA1C 6.1 (H) 05/02/2023   HGBA1C 5.9 (H) 08/04/2022   HGBA1C 6.3 01/31/2022   disc imp of low glycemic diet and wt loss to prevent DM2

## 2023-05-04 NOTE — Assessment & Plan Note (Signed)
 Weight loss may help  Generic zepbound may be option in light of both osa and fatty liver   Once her TIA and foot infection are worked up -may try to get this covered Disc option of GLP medication including possible side effects like GI intolerance and risk of thyroid and endocrine cancer, pancreatitis and gallstones, kidney problems and diabetic retinopathy

## 2023-05-04 NOTE — Assessment & Plan Note (Signed)
 Unsure if recent neuro changes could have been atypical migraine Reviewed hospital records, lab results and studies in detail   Will make sure she gets follow up with neurology

## 2023-05-05 ENCOUNTER — Ambulatory Visit
Admission: RE | Admit: 2023-05-05 | Discharge: 2023-05-05 | Disposition: A | Discharge status: Home / Self Care | Source: Ambulatory Visit | Attending: Family Medicine | Admitting: Family Medicine

## 2023-05-05 DIAGNOSIS — M25571 Pain in right ankle and joints of right foot: Secondary | ICD-10-CM

## 2023-05-05 DIAGNOSIS — G8929 Other chronic pain: Secondary | ICD-10-CM

## 2023-05-05 DIAGNOSIS — M7671 Peroneal tendinitis, right leg: Secondary | ICD-10-CM | POA: Diagnosis not present

## 2023-05-05 DIAGNOSIS — M79671 Pain in right foot: Secondary | ICD-10-CM

## 2023-05-05 DIAGNOSIS — S93491A Sprain of other ligament of right ankle, initial encounter: Secondary | ICD-10-CM | POA: Diagnosis not present

## 2023-05-06 ENCOUNTER — Other Ambulatory Visit: Payer: Medicare Other

## 2023-05-06 NOTE — Progress Notes (Unsigned)
   Justo Hengel T. Farhan Jean, MD, CAQ Sports Medicine [facility] [address] [place], [address]  Phone: [phone]  FAX: [phone]  Erin Petersen - 83 y.o. female  MRN [id number]  Date of Birth: May 16, 1940  Date: 05/07/2023  PCP: Judy Pimple, MD  Referral: Judy Pimple, MD  No chief complaint on file.  Subjective:   Erin Petersen is a 83 y.o. very pleasant female patient who presents with the following:  Patient is here for follow-up foot and ankle.  I did do an MRI of the ankle on May 05, 2023.  At the time of her loss office visit, she had seen me in previously a few months before she had had an inversion injury of the right ankle.  She previously was evaluated and treated by Dr. Rosita Kea.  At the time that I saw her, she was having relatively diffuse ankle pain and foot pain and was having minimal ability to walk and diffuse pain throughout most of the foot and ankle.  At that point, I felt that her multipart pain included pain at the medial malleolus, lateral malleolus, talus, Achilles tendon insertion,, peroneal tendon, posterior tibialis tendon, ATFL, CFL, and calcaneus.  I placed her in a cam walker boot.  She communicated with me a number of times through the computer system.  She was having difficulty bearing weight and walking and was really having a very difficult time tolerating the cam walker boot.  At that point I advised her to discontinue her cam walker boot and to become nonweightbearing.  She was having difficulty giving this.  She also again contacted [place] a number of other times with concerns including pain with the dorsum of her foot, her foot in general as well as worsening of pain with immobilization.  At that point, I obtained an MRI of the ankle.  She requested an MRI of the foot, we were unable to get this approved through her insurance company.    Review of Systems is noted in the HPI, as  appropriate  Objective:   LMP  (LMP Unknown)    Laboratory and Imaging Data:  Assessment and Plan:   ***

## 2023-05-07 ENCOUNTER — Other Ambulatory Visit: Payer: Self-pay | Admitting: Family Medicine

## 2023-05-07 ENCOUNTER — Encounter: Payer: Self-pay | Admitting: Neurosurgery

## 2023-05-07 ENCOUNTER — Ambulatory Visit: Admitting: Podiatry

## 2023-05-07 ENCOUNTER — Ambulatory Visit (INDEPENDENT_AMBULATORY_CARE_PROVIDER_SITE_OTHER): Payer: Medicare Other | Admitting: Family Medicine

## 2023-05-07 VITALS — BP 138/72 | HR 72 | Temp 97.0°F | Ht <= 58 in | Wt 177.0 lb

## 2023-05-07 DIAGNOSIS — L03031 Cellulitis of right toe: Secondary | ICD-10-CM

## 2023-05-07 DIAGNOSIS — M7672 Peroneal tendinitis, left leg: Secondary | ICD-10-CM

## 2023-05-07 DIAGNOSIS — M7661 Achilles tendinitis, right leg: Secondary | ICD-10-CM

## 2023-05-07 DIAGNOSIS — S93411A Sprain of calcaneofibular ligament of right ankle, initial encounter: Secondary | ICD-10-CM

## 2023-05-07 DIAGNOSIS — M76821 Posterior tibial tendinitis, right leg: Secondary | ICD-10-CM | POA: Diagnosis not present

## 2023-05-07 DIAGNOSIS — M4325 Fusion of spine, thoracolumbar region: Secondary | ICD-10-CM

## 2023-05-07 DIAGNOSIS — M25571 Pain in right ankle and joints of right foot: Secondary | ICD-10-CM

## 2023-05-07 DIAGNOSIS — M25471 Effusion, right ankle: Secondary | ICD-10-CM

## 2023-05-07 DIAGNOSIS — M76822 Posterior tibial tendinitis, left leg: Secondary | ICD-10-CM

## 2023-05-07 DIAGNOSIS — M7671 Peroneal tendinitis, right leg: Secondary | ICD-10-CM

## 2023-05-07 DIAGNOSIS — L600 Ingrowing nail: Secondary | ICD-10-CM | POA: Diagnosis not present

## 2023-05-07 DIAGNOSIS — M79673 Pain in unspecified foot: Secondary | ICD-10-CM | POA: Diagnosis not present

## 2023-05-07 DIAGNOSIS — M419 Scoliosis, unspecified: Secondary | ICD-10-CM

## 2023-05-07 DIAGNOSIS — G8929 Other chronic pain: Secondary | ICD-10-CM

## 2023-05-07 DIAGNOSIS — M79671 Pain in right foot: Secondary | ICD-10-CM

## 2023-05-07 DIAGNOSIS — S93491A Sprain of other ligament of right ankle, initial encounter: Secondary | ICD-10-CM

## 2023-05-07 MED ORDER — DOXYCYCLINE HYCLATE 100 MG PO CAPS: 100.0000 mg | ORAL_CAPSULE | Freq: Two times a day (BID) | ORAL | 0 refills | Status: AC

## 2023-05-07 NOTE — Progress Notes (Unsigned)
 Chief Complaint  Patient presents with   Nail Problem    " I had a very mild TIA on Wednesday and she was on Antibiotics and I finished them yesteryday and I am not sure if its any better,  I had sent a mychart message and it took dr Logan Bores almost a week to respond to  them message"    HPI: 83 y.o. female presents today for follow-up of right hallux bilateral border PNA by Dr. Logan Bores.  She recently was in the hospital due to a TIA and they noticed the toe and the post-procedure infection that she had.  They put her on IV antibiotics while in the hospital and then she was discharged with PO Augmentin.  She has finished that.  Her concern is that she is traveling to [place] later this week and knows she will swell in her legs and feet while she is traveling.  She also has been wearing a cam walker on that extremity from Ortho and will be getting an evaluation for possible ankle surgery.  She states the cam walker has been irritating the toe in the area of the procedure.  She had previously sent photos to Dr. Logan Bores through her MyChart account, in which the toe did appear infected when reviewing the photos today.  Upon discharge from the hospital, she states that the physician advised her to stop putting ointment on the toe and stop covering the toe so that the area can scab over and dry out.  Past Medical History:  Diagnosis Date   Allergy 1956   Anemia    Arthritis    Asthma    Bacteremia due to Gram-negative bacteria 06/04/2019   Blood transfusion without reported diagnosis February 2024   CAD (coronary artery disease) 07/21/2016   a.) LHC 07/21/2016: mild nonobstructive CAD   Colon polyps    Complication of anesthesia    a.) anesthesia awareness   Compression fracture of L1 lumbar vertebra (HCC)    COPD (chronic obstructive pulmonary disease) (HCC)    Depression    Diastolic dysfunction    a.) TTE 03/02/2021: EF >55%, mild LVH, mild LAE, triv MR, mild TR/PR, G1DD   Duodenal ulcer     Dyspnea    GERD (gastroesophageal reflux disease)    History of 2019 novel coronavirus disease (COVID-19) 01/19/2021   a.) tested (+) at [facility]   History of lumbar fusion    a.) T12-S1   Hyperlipidemia    Hypertension    Insomnia    a.) takes melatonin PRN   OSA on CPAP    Osteopenia    Peripheral neuropathy    Plantar fasciitis    Pneumonia    Pulmonary hypertension (HCC)    a.) TTE 06/04/2019: moderate (PASP 40.3 mmHg)   Right inguinal hernia    a.) s/p repair 12/2016   RLS (restless legs syndrome)    a.) on ropinirole   Sleep apnea 2009   SOB (shortness of breath)    T12 compression fracture (HCC)    TIA (transient ischemic attack)    pt unsure how many, had MRIs here showing this   Umbilical hernia    a.) s/p repair 12/2016   Urinary incontinence    Urinary tract infection    Wears hearing aid in both ears     Past Surgical History:  Procedure Laterality Date   ABDOMINAL HYSTERECTOMY  1972   APPENDECTOMY  1958   APPLICATION OF INTRAOPERATIVE CT SCAN N/A 04/17/2022  Procedure: APPLICATION OF INTRAOPERATIVE CT SCAN;  Surgeon: Venetia Night, MD;  Location: [facility] OR[facility];  Service: Neurosurgery;  Laterality: N/A;   BILATERAL SALPINGOOPHORECTOMY  01/07/2018   BIOPSY  04/15/2019   Procedure: BIOPSY;  Surgeon: Toney Reil, MD;  Location: [facility];  Service: Endoscopy;;   CARDIAC CATHETERIZATION Left 07/21/2016   COLONOSCOPY WITH PROPOFOL N/A 11/11/2018   Procedure: COLONOSCOPY WITH PROPOFOL;  Surgeon: Toney Reil, MD;  Location: [facility] END[facility];  Service: Gastroenterology;  Laterality: N/A;   CYSTO WITH HYDRODISTENSION N/A 12/30/2018   Procedure: CYSTOSCOPY/HYDRODISTENSION WITH MARCAINE;  Surgeon: Alfredo Martinez, MD;  Location: [facility];  Service: Urology;  Laterality: N/A;   ESOPHAGOGASTRODUODENOSCOPY (EGD) WITH PROPOFOL N/A 04/15/2019   Procedure: ESOPHAGOGASTRODUODENOSCOPY (EGD) WITH PROPOFOL;  Surgeon: Toney Reil, MD;   Location: [facility];  Service: Endoscopy;  Laterality: N/A;  sleep apnea   ESOPHAGOGASTRODUODENOSCOPY (EGD) WITH PROPOFOL N/A 07/09/2019   Procedure: ESOPHAGOGASTRODUODENOSCOPY (EGD) WITH PROPOFOL;  Surgeon: Toney Reil, MD;  Location: [facility] ENDOSCOPY;  Service: Gastroenterology;  Laterality: N/A;   ESOPHAGOGASTRODUODENOSCOPY (EGD) WITH PROPOFOL N/A 06/15/2022   Procedure: ESOPHAGOGASTRODUODENOSCOPY (EGD) WITH PROPOFOL;  Surgeon: Toney Reil, MD;  Location: [facility];  Service: Endoscopy;  Laterality: N/A;  sleep apnea   EYE SURGERY Bilateral    INGUINAL HERNIA REPAIR Bilateral 01/05/2017   JOINT REPLACEMENT     LAPAROSCOPIC BILATERAL SALPINGO OOPHERECTOMY  2019   LUMBAR FUSION  2002   LUMBAR FUSION  11/14/2021   Dr Sharolyn Douglas   LUMBAR FUSION  12/14/2021   Dr Malachy Chamber   SPINAL FUSION  2002   TONSILLECTOMY AND ADENOIDECTOMY  1961   TOTAL KNEE ARTHROPLASTY Right 01/01/2020   Procedure: Right total knee arthroplasty - [place] Neighbor to Assist;  Surgeon: Kennedy Bucker, MD;  Location: [facility];  Service: Orthopedics;  Laterality: Right;   TOTAL KNEE ARTHROPLASTY Left 10/12/2020   Procedure: TOTAL KNEE ARTHROPLASTY;  Surgeon: Kennedy Bucker, MD;  Location: [facility];  Service: Orthopedics;  Laterality: Left;   TUBAL LIGATION     UMBILICAL HERNIA REPAIR N/A 01/05/2017    Allergies  Allergen Reactions   Bee Venom Anaphylaxis and Hives   Fosamax [Alendronate] Nausea Only    Stomach pain   Fusion Plus [Iron-Fa-B Cmp-C-Biot-Probiotic] Nausea Only    Stomach pain   Ciprofloxacin Hcl Rash    Mild rash after long term use   Sulfa Antibiotics Rash    Bactrim    Physical Exam: There are palpable pedal pulses on the right foot.  The right hallux along the medial and lateral borders where the procedures were performed has improvement of erythema and edema and a dry eschar is starting to form but there still is erythema present with minimal  pain.  Assessment/Plan of Care: 1. Cellulitis of right toe   2. Ingrown toenail of right foot      Meds ordered this encounter  Medications   doxycycline (VIBRAMYCIN) 100 MG capsule    Sig: Take 1 capsule (100 mg total) by mouth 2 (two) times daily for 7 days.    Dispense:  14 capsule    Refill:  0   Discussed clinical findings with patient today.  The upcoming travel would be concerning for increased swelling as this may try to find an outlet for drainage in the toe where the procedures were performed.  She will wear compression socks/stockings to try to keep the swelling under control.  She may continue with Epsom salt soaks once a day  as needed.  She does note that this is a soothing comfort that the soaks provide.  She was given a prescription for doxycycline 100 mg twice daily to go ahead and fill and take on her trip.  If she gets to her destination and the swelling is severe, this could cause some drainage/seepage to come out from the PNA sites.  If the area gets swollen, macerated, red, and warm she is instructed to go ahead and take all of the prescribed doxycycline and follow-up with Dr. Logan Bores when she returns to the area.   Clerance Lav, DPM, FACFAS [facility]     [address] [place], [place] [address]                Office [phone]  Fax [phone]

## 2023-05-08 ENCOUNTER — Encounter: Payer: Self-pay | Admitting: Family Medicine

## 2023-05-18 ENCOUNTER — Encounter: Payer: Self-pay | Admitting: Podiatry

## 2023-05-18 ENCOUNTER — Encounter: Payer: Self-pay | Admitting: Family Medicine

## 2023-05-18 ENCOUNTER — Ambulatory Visit: Payer: Medicare Other | Admitting: Podiatry

## 2023-05-18 DIAGNOSIS — L600 Ingrowing nail: Secondary | ICD-10-CM | POA: Diagnosis not present

## 2023-05-18 NOTE — Progress Notes (Signed)
   Chief Complaint  Patient presents with   Nail Problem    "It doing a little better.  It's still red."    Subjective: 83 y.o. female presents today status post permanent nail avulsion procedure of the medial and lateral border of the right great toe that was performed on 04/20/2023.  Patient doing well.  Significant improvement.  She did complete a round of oral antibiotics after the procedure..   Past Medical History:  Diagnosis Date   Allergy 1955   Anemia    Arthritis    Asthma    Bacteremia due to Gram-negative bacteria 06/04/2019   Blood transfusion without reported diagnosis February 2024   CAD (coronary artery disease) 07/21/2016   a.) LHC 07/21/2016: mild nonobstructive CAD   Colon polyps    Complication of anesthesia    a.) anesthesia awareness   Compression fracture of L1 lumbar vertebra (HCC)    COPD (chronic obstructive pulmonary disease) (HCC)    Depression    Diastolic dysfunction    a.) TTE 03/02/2021: EF >55%, mild LVH, mild LAE, triv MR, mild TR/PR, G1DD   Duodenal ulcer    Dyspnea    GERD (gastroesophageal reflux disease)    History of 2019 novel coronavirus disease (COVID-19) 01/19/2021   a.) tested (+) at [facility]   History of lumbar fusion    a.) T12-S1   Hyperlipidemia    Hypertension    Insomnia    a.) takes melatonin PRN   OSA on CPAP    Osteopenia    Peripheral neuropathy    Plantar fasciitis    Pneumonia    Pulmonary hypertension (HCC)    a.) TTE 06/04/2019: moderate (PASP 40.3 mmHg)   Right inguinal hernia    a.) s/p repair 12/2016   RLS (restless legs syndrome)    a.) on ropinirole   Sleep apnea 2009   SOB (shortness of breath)    T12 compression fracture (HCC)    TIA (transient ischemic attack)    pt unsure how many, had MRIs here showing this   Umbilical hernia    a.) s/p repair 12/2016   Urinary incontinence    Urinary tract infection    Wears hearing aid in both ears     Objective: Neurovascular status intact.  Skin is  warm, dry and supple. Nail and respective nail fold appears to be healing appropriately.   Assessment: #1 s/p partial permanent nail matrixectomy medial and lateral border right great toe.  04/20/2023   Plan of care: #1 patient was evaluated  #2 light debridement of the periungual debris was performed to the border of the respective toe and nail plate using a tissue nipper. #3 patient is to return to clinic on a PRN basis.   Felecia Shelling, DPM [facility]  Dr. Felecia Shelling, DPM    [address] [place], [place] [address]                Office [phone]  Fax [phone]

## 2023-05-23 DIAGNOSIS — M7661 Achilles tendinitis, right leg: Secondary | ICD-10-CM | POA: Diagnosis not present

## 2023-05-23 DIAGNOSIS — M7671 Peroneal tendinitis, right leg: Secondary | ICD-10-CM | POA: Diagnosis not present

## 2023-05-28 ENCOUNTER — Other Ambulatory Visit: Payer: Self-pay | Admitting: Family Medicine

## 2023-05-29 MED ORDER — TIZANIDINE HCL 4 MG PO TABS: 4.0000 mg | ORAL_TABLET | Freq: Three times a day (TID) | ORAL | 1 refills | Status: DC | PRN

## 2023-05-29 NOTE — Telephone Encounter (Signed)
 Pt requested this med to be sent to mail order via mychart. I did look at past refills and PCP hasn't refilled med before. Last OV was a hospital f/u on 05/04/23

## 2023-06-04 DIAGNOSIS — G629 Polyneuropathy, unspecified: Secondary | ICD-10-CM | POA: Diagnosis not present

## 2023-06-04 DIAGNOSIS — Z9889 Other specified postprocedural states: Secondary | ICD-10-CM | POA: Diagnosis not present

## 2023-06-04 DIAGNOSIS — G5712 Meralgia paresthetica, left lower limb: Secondary | ICD-10-CM | POA: Diagnosis not present

## 2023-06-04 DIAGNOSIS — G2581 Restless legs syndrome: Secondary | ICD-10-CM | POA: Diagnosis not present

## 2023-06-06 ENCOUNTER — Encounter: Payer: Self-pay | Admitting: Family Medicine

## 2023-06-07 ENCOUNTER — Encounter: Payer: Self-pay | Admitting: Student in an Organized Health Care Education/Training Program

## 2023-06-07 ENCOUNTER — Ambulatory Visit
Attending: Student in an Organized Health Care Education/Training Program | Admitting: Student in an Organized Health Care Education/Training Program

## 2023-06-07 ENCOUNTER — Ambulatory Visit
Admission: RE | Admit: 2023-06-07 | Discharge: 2023-06-07 | Disposition: A | Discharge status: Home / Self Care | Source: Ambulatory Visit | Attending: Student in an Organized Health Care Education/Training Program | Admitting: Student in an Organized Health Care Education/Training Program

## 2023-06-07 VITALS — BP 139/78 | HR 75 | Temp 97.2°F | Resp 18 | Ht <= 58 in | Wt 175.0 lb

## 2023-06-07 DIAGNOSIS — G894 Chronic pain syndrome: Secondary | ICD-10-CM | POA: Insufficient documentation

## 2023-06-07 DIAGNOSIS — M961 Postlaminectomy syndrome, not elsewhere classified: Secondary | ICD-10-CM | POA: Diagnosis not present

## 2023-06-07 DIAGNOSIS — M5416 Radiculopathy, lumbar region: Secondary | ICD-10-CM

## 2023-06-07 DIAGNOSIS — G8929 Other chronic pain: Secondary | ICD-10-CM | POA: Diagnosis not present

## 2023-06-07 DIAGNOSIS — Z981 Arthrodesis status: Secondary | ICD-10-CM | POA: Diagnosis not present

## 2023-06-07 MED ORDER — BUPRENORPHINE 10 MCG/HR TD PTWK: 1.0000 | MEDICATED_PATCH | TRANSDERMAL | 0 refills | Status: AC

## 2023-06-07 MED ORDER — BUPRENORPHINE 7.5 MCG/HR TD PTWK: 1.0000 | MEDICATED_PATCH | TRANSDERMAL | 0 refills | Status: AC

## 2023-06-07 NOTE — Progress Notes (Signed)
 PROVIDER NOTE: Interpretation of information contained herein should be left to medically-trained personnel. Specific patient instructions are provided elsewhere under "Patient Instructions" section of medical record. This document was created in part using AI and STT-dictation technology, any transcriptional errors that may result from this process are unintentional.  Patient: Erin Petersen  Service: E/M Encounter  PCP: Judy Pimple, MD  DOB: [birth year]  DOS: 06/07/2023  Provider: Edward Jolly, MD  MRN: [id number]  Delivery: Face-to-face  Specialty: Interventional Pain Management  Type: New Patient  Setting: Ambulatory outpatient facility  Specialty designation: 09  Referring Prov.: Venetia Night, MD  Location: Outpatient office facility     Primary Reason(s) for Visit: Encounter for initial evaluation of one or more chronic problems (new to examiner) potentially causing chronic pain, and posing a threat to normal musculoskeletal function. (Level of risk: High) CC: Back Pain (lower)  HPI  Erin Petersen is a 83 y.o. year old, female patient, who comes for the first time to our practice referred by Venetia Night, MD for our initial evaluation of her chronic pain. She has Allergic rhinitis; Benign essential hypertension; Neuropathy; OSA on CPAP; Osteoarthritis; Other and unspecified disc disorder of lumbar region; Adverse reaction to influenza vaccine, initial encounter; Prediabetes; Restless legs syndrome; Chronic dyspnea; Tinnitus of both ears; Vitamin D deficiency; Asthma; Osteoporosis; High-tone pelvic floor dysfunction; IC (interstitial cystitis); Neuralgia of both pudendal nerves; Pelvic pain in female; Estrogen deficiency; History of skin cancer; History of vaginal hysterectomy; S/P BSO (bilateral salpingo-oophorectomy); S/P TKR (total knee replacement) using cement, right; Migraine; IBS (irritable bowel syndrome); Encounter for screening mammogram for breast cancer; Encounter for general  adult medical examination with abnormal findings; GERD (gastroesophageal reflux disease); Dizzy spells; Somnolence, daytime; S/P TKR (total knee replacement) using cement, left; Hyperlipidemia; Pedal edema; Mild pulmonary hypertension (HCC); Constipation due to pain medication; Failed back surgical syndrome; Back pain at L4-L5 level; Colonic polyp; Chronic obstructive pulmonary disease (HCC); Diverticulosis of colon; Inguinal hernia of right side without obstruction or gangrene; Other secondary scoliosis, lumbar region; Umbilical hernia without obstruction or gangrene; Lumbar post-laminectomy syndrome; Abdominal wall bulge; Iron deficiency anemia; Pseudoarthrosis of thoracic spine after fusion procedure; Other secondary kyphosis, thoracic region; Compression fracture of T12 vertebra (HCC); Spinal instability, thoracic; History of duodenal ulcer; Headache; Epigastric pain; Laceration of left hand; Pain and swelling of right ankle; Stroke-like symptom; Pain in right toe(s); Inflammation of interphalangeal joint of right toe due to infection (HCC); Fatty liver; Infection of great toe; TIA (transient ischemic attack); Class 2 obesity due to excess calories with body mass index (BMI) of 35.0 to 35.9 in adult; Chronic radicular lumbar pain; Status post lumbar spinal fusion; History of thoracic spinal fusion; and Chronic pain syndrome on their problem list. Today she comes in for evaluation of her Back Pain (lower)  Pain Assessment: Location: Lower Back Radiating: denies Onset: More than a month ago Duration: Chronic pain Quality: Other (Comment) (breathtaking) Severity: 5 /10 (subjective, self-reported pain score)  Effect on ADL: difficulty performing daily activiities Timing: Intermittent Modifying factors: sitting in recliner, ice, lying down on right side with pillow between legs BP: 139/78  HR: 75  Onset and Duration: Present longer than 3 months Cause of pain:  not noted Severity: Getting worse,  NAS-11 at its worse: 7/10, NAS-11 at its best: 3/10, NAS-11 now: 3/10, and NAS-11 on the average: 4/10 Timing: Morning, Afternoon, Night, During activity or exercise, and After activity or exercise Aggravating Factors: Bending, Kneeling, Lifiting, Motion, Prolonged standing, Stooping , Twisting, Walking,  Walking uphill, and Working Alleviating Factors: Lying down, Resting, Sitting, Sleeping, and Warm showers or baths Associated Problems: Numbness, Spasms, and Tingling Quality of Pain: Aching, Burning, Deep, Disabling, Dreadful, Exhausting, Sharp, Shooting, Stabbing, Throbbing, Tingling, and Tiring Previous Examinations or Tests: CT scan, MRI scan, and Orthopedic evaluation Previous Treatments: Stretching exercises  Erin Petersen is being evaluated for possible interventional pain management therapies for the treatment of her chronic pain.   Discussed the use of AI scribe software for clinical note transcription with the patient, who gave verbal consent to proceed.  History of Present Illness   Erin Petersen is an 83 year old female with a history of extensive spinal fusion who presents with persistent back pain. She was referred by neurosurgeons for evaluation of her persistent back pain post-surgery.  She experiences persistent back pain, particularly after standing for extended periods. The pain is described as 'agony' after a busy day involving a lot of standing. Sitting in a recliner provides some relief, but this is not a sustainable lifestyle for her.  She has undergone three back surgeries within a six-month period, with the last surgery performed over a year ago. The surgeries included removal and reconstruction of her spine. After the second surgery, a bone fracture, collapse, and dislodged screws necessitated further surgical intervention. The extensive hardware in her spine limits additional surgical options.  She has a T10 to sacrum fusion.  She manages her pain with over-the-counter  medications, specifically taking two extra strength Tylenol in the morning, two in the afternoon, and two at bedtime. She has not been on any other pain management regimen since her surgeries.  Socially, she was very active in the past, including skiing at the age of 33. Her current condition has significantly impacted her mobility, and she now struggles with walking.       Historic Controlled Substance Pharmacotherapy Review   Historical Monitoring: The patient  reports no history of drug use. List of prior UDS Testing: Lab Results  Component Value Date   [facility] <10 05/01/2023   Historical Background Evaluation: [place] PMP: PDMP reviewed during this encounter. Review of the past 20-months conducted.             [place] Department of public safety, offender search: Engineer, mining Information) Non-contributory Risk Assessment Profile: Aberrant behavior: None observed or detected today Risk factors for fatal opioid overdose: None identified today Fatal overdose hazard ratio (HR): Calculation deferred Non-fatal overdose hazard ratio (HR): Calculation deferred Risk of opioid abuse or dependence: 0.7-3.0% with doses <= 36 MME/day and 6.1-26% with doses >= 120 MME/day. Substance use disorder (SUD) risk level: See below Personal History of Substance Abuse (SUD-Substance use disorder):  Alcohol: Negative  Illegal Drugs: Negative  Rx Drugs: Negative  ORT Risk Level calculation: Low Risk  Opioid Risk Tool - 06/07/23 1411       Family History of Substance Abuse   Alcohol Positive Female    Illegal Drugs Negative    Rx Drugs Negative      Personal History of Substance Abuse   Alcohol Negative    Illegal Drugs Negative    Rx Drugs Negative      Age   Age between 16-45 years  No      History of Preadolescent Sexual Abuse   History of Preadolescent Sexual Abuse Negative or Female      Psychological Disease   Psychological Disease Negative    Depression Negative      Total Score   Opioid Risk  Tool Scoring 1    Opioid Risk Interpretation Low Risk            ORT Scoring interpretation table:  Score <3 = Low Risk for SUD  Score between 4-7 = Moderate Risk for SUD  Score >8 = High Risk for Opioid Abuse   PHQ-2 Depression Scale:  Total score: 0  PHQ-2 Scoring interpretation table: (Score and probability of major depressive disorder)  Score 0 = No depression  Score 1 = 15.4% Probability  Score 2 = 21.1% Probability  Score 3 = 38.4% Probability  Score 4 = 45.5% Probability  Score 5 = 56.4% Probability  Score 6 = 78.6% Probability   PHQ-9 Depression Scale:  Total score: 0  PHQ-9 Scoring interpretation table:  Score 0-4 = No depression  Score 5-9 = Mild depression  Score 10-14 = Moderate depression  Score 15-19 = Moderately severe depression  Score 20-27 = Severe depression (2.4 times higher risk of SUD and 2.89 times higher risk of overuse)    Meds   Current Outpatient Medications:    albuterol (VENTOLIN HFA) 108 (90 Base) MCG/ACT inhaler, Inhale 2 puffs into the lungs every 6 (six) hours as needed for wheezing or shortness of breath., Disp: 8 g, Rfl: 5   amitriptyline (ELAVIL) 25 MG tablet, TAKE 1 TABLET AT BEDTIME, Disp: 90 tablet, Rfl: 1   amLODipine (NORVASC) 5 MG tablet, Take 1 tablet (5 mg total) by mouth daily., Disp: 90 tablet, Rfl: 2   aspirin 81 MG EC tablet, Take 81 mg by mouth at bedtime. , Disp: , Rfl:    [START ON 07/05/2023] buprenorphine (BUTRANS) 10 MCG/HR PTWK, Place 1 patch onto the skin once a week for 28 days., Disp: 4 patch, Rfl: 0   buprenorphine (BUTRANS) 7.5 MCG/HR, Place 1 patch onto the skin once a week for 28 days., Disp: 4 patch, Rfl: 0   Cholecalciferol (VITAMIN D3) 50 MCG (2000 UT) TABS, Take 2,000 Units by mouth daily., Disp: , Rfl:    Cyanocobalamin (VITAMIN B-12) 1000 MCG SUBL, Take 2,000 mcg by mouth daily., Disp: , Rfl:    EPINEPHrine 0.3 mg/0.3 mL IJ SOAJ injection, Inject 0.3 mg into the muscle as needed for anaphylaxis., Disp: 2  each, Rfl: 0   fluticasone (FLONASE) 50 MCG/ACT nasal spray, Place 2 sprays into both nostrils daily. qhs (Patient taking differently: Place 2 sprays into both nostrils daily as needed.), Disp: 48 g, Rfl: 0   hydrochlorothiazide (HYDRODIURIL) 25 MG tablet, TAKE 1 TABLET DAILY, Disp: 90 tablet, Rfl: 3   Melatonin 10 MG CAPS, Take 1 capsule by mouth as needed., Disp: , Rfl:    polyethylene glycol (MIRALAX / GLYCOLAX) 17 g packet, Take 17 g by mouth daily., Disp: , Rfl:    pregabalin (LYRICA) 150 MG capsule, Take one pill by mouth in the am and 2 in the pm (Patient taking differently: 100 mg 2 (two) times daily. Two in a.m.  , three at bedtime), Disp: 270 capsule, Rfl: 0   rOPINIRole (REQUIP) 1 MG tablet, Take 1 mg by mouth in the morning and at bedtime. 1 in am and 1 at 3pm, Disp: , Rfl:    rOPINIRole (REQUIP) 4 MG tablet, TAKE 1 TABLET AT BEDTIME, Disp: 90 tablet, Rfl: 3   simvastatin (ZOCOR) 20 MG tablet, TAKE 1 TABLET AT BEDTIME, Disp: 90 tablet, Rfl: 3   tiZANidine (ZANAFLEX) 4 MG tablet, Take 1 tablet (4 mg total) by mouth 3 (three) times daily as needed for muscle  spasms., Disp: 90 tablet, Rfl: 1  Imaging Review  CT THORACIC SPINE WO CONTRAST  Narrative CLINICAL DATA:  Constant thoracic spine pain. The patient underwent revision of T12-S1 posterior fusion 12/14/2021.  EXAM: CT THORACIC SPINE WITHOUT CONTRAST  TECHNIQUE: Multidetector CT images of the thoracic were obtained using the standard protocol without intravenous contrast.  RADIATION DOSE REDUCTION: This exam was performed according to the departmental dose-optimization program which includes automated exposure control, adjustment of the mA and/or kV according to patient size and/or use of iterative reconstruction technique.  COMPARISON:  Plain films lumbar spine 12/15/2021 and 03/14/2022. MRI thoracic spine 08/19/2021  FINDINGS: Alignment: Maintained.  Vertebrae: There is partial visualization of T12-S1 fusion which  is seen on the prior plain films. As on the most recent plain films, the patient has a T12 superior endplate compression fracture with vertebral body height loss of approximately 50%. The fracture is new since the 12/15/2021 exam. There is extensive lucency about both pedicle screws in T12 and the screws penetrate the superior endplate of T12 extending into the disc interspace. No other acute fracture is identified.  Remote L1 compression fracture with vertebral body height loss up to 80%, worse to the right, is unchanged. There is a single pedicle screw on the left in L1. Bilateral pedicle screws are in place in L2.  Paraspinal and other soft tissues: No paraspinous fluid collection is identified. Aortic atherosclerosis is noted.  Disc levels: As visualized by CT scan, the T1-2 to T10-11 levels appear patent with scattered mild disc bulging most notable at T10-11.  T11-12: Mild bony retropulsion is seen off the superior endplate of T12 but the central canal and foramina appear open.  L1-2: Bony retropulsion off the superior endplate of L1 causes mild central canal narrowing. The foramina appear open.  L1-2: Negative.  IMPRESSION: Patient status post T12-S1 fusion. Since plain films 12/15/2021, the patient has suffered a superior endplate compression fracture of T12 with vertebral body height loss of approximately 50%. There is extensive lucency about both pedicle screws in T12 consistent with loosening. The screws extend into the T11-12 interspace.  Remote superior endplate compression fracture of L1. There is mild central canal stenosis at T12-L1 due to bony retropulsion.   Electronically Signed By: Drusilla Kanner M.D. On: 03/29/2022 10:47    DG Thoracic Spine 2 View  Narrative CLINICAL DATA:  Follow-up thoracic spine fusion. Lower midline back pain.  EXAM: THORACIC SPINE 2 VIEWS  COMPARISON:  10/12/2022.  FINDINGS: Status post posterior fusion from T10  through the lumbar spine and below the included field of view. There are bilateral pedicle screws with associated fixation cement at T10 and T11. Intact interconnecting rods extend from T10 to the lower lumbar spine. Hardware appears well positioned and seated without evidence of loosening.  Mild wedge-shaped deformity of T9 is unchanged and may reflect an old fracture or be degenerative. Fracture of T12 is stable. No new fractures. No bone lesions. No spondylolisthesis.  Mild curvature of the lower thoracic spine, convex to the right.  Skeletal structures are demineralized.  Soft tissues are unremarkable.  IMPRESSION: 1. No acute fracture or acute finding. 2. Well positioned and seated orthopedic hardware. No evidence of loosening. 3. Chronic T12 fracture.   Electronically Signed By: Amie Portland M.D. On: 03/26/2023 11:50    MR LUMBAR SPINE WO CONTRAST  Narrative CLINICAL DATA:  Chronic low back pain. Bilateral leg pain, tingling, numbness and weakness.  EXAM: MRI LUMBAR SPINE WITHOUT CONTRAST  TECHNIQUE: Multiplanar,  multisequence MR imaging of the lumbar spine was performed. No intravenous contrast was administered.  COMPARISON:  CT abdomen pelvis examination dated June 03, 2019  FINDINGS: Segmentation:  Standard.  Alignment:  Loss of normal cervical lordosis.  Vertebrae: Chronic compression deformity of L1 vertebral body with approximately 50% vertebral body height loss. Posterior spinal and interbody fusion at L4-S1 with intact hardware.  Conus medullaris and cauda equina: Conus extends to the L1-L2 level. Conus and cauda equina appear normal.  Paraspinal and other soft tissues: Negative.  Disc spaces:  T12-L1: Compression deformity of the L1 with retropulsion of the superior endplate and disc into the spinal canal with mild spinal canal stenosis. No significant neural foraminal narrowing.  L1-L2: Mild asymmetric disc bulge to the left with  narrowing of the left lateral recess. Ligamentum flavum hypertrophy. No significant neural foraminal narrowing.  L2-L3: Broad-based disc protrusion and ligamentum flavum hypertrophy. Marked narrowing of the spinal canal and bilateral subarticular lateral recesses. Facet joint arthropathy, left greater than the right. Mild right and moderate left neural foraminal narrowing.  L3-L4: Large disc protrusion and ligamentum flavum hypertrophy. Moderate narrowing of the spinal canal. Narrowing of the bilateral subarticular lateral recesses and prominent facet joint arthropathy, left greater than the right. Mild right and moderate left neural foraminal narrowing.  L4-L5: Laminectomy and interbody fusion. No significant spinal canal or neural foraminal narrowing.  L5-S1: Postsurgical changes with posterior and interbody fusion. No spinal canal or neural foraminal narrowing.  IMPRESSION: 1. Posterior spinal and interbody fusion at L4-S1 with intact hardware.  2. Interval progression of degenerative disc disease at L2-L3 and L3-L4 with spinal canal narrowing and encroachment of the nerve roots in the lateral recesses. Advanced facet joint arthropathy with mild right and moderate left neural foraminal stenosis at L2-L3 and L3-L4.  3. Chronic compression deformity of the L1 vertebral body with retropulsion of the superior endplate and disc protrusion with narrowing of spinal canal. No significant neural foraminal narrowing.  4.  No acute fracture or subluxation.   Electronically Signed By: Larose Hires D.O. On: 05/10/2021 14:44  Narrative CLINICAL DATA:  Postop thoracolumbar fusion.  EXAM: LUMBAR SPINE - 2-3 VIEW  COMPARISON:  10/12/2022.  FINDINGS: Previous posterior fusion has been performed from T10 through the sacrum. There are pedicle screws bilaterally at T10, T11 L1, L2, L3, L4 and S1 with a left-sided pedicle screw at L5. There are also screws extending across both  SI joints. Interconnecting rods extend from T10 through S1 with additional interconnecting rod on from L4 through S1, and another on the right from L4 to the SI joint fusion screw. Lateral fixation plate noted at L3 and L4 on the left. Intervertebral cage is are present at L3-L4 and L4-L5. All hardware appears intact, well positioned and stable without evidence of loosening.  Chronic fractures of T12 and L1. No other lumbar spine fractures. No bone lesions. Stable alignment.  Skeletal structures are demineralized.  Aortic atherosclerotic calcifications.  IMPRESSION: 1. No acute findings. 2. Chronic compression fractures of T12 and L1. 3. Stable appearance of the long thoracolumbar fusion as detailed. No evidence of hardware failure.   Electronically Signed By: Amie Portland M.D. On: 03/26/2023 11:55   DG HIP UNILAT WITH PELVIS 2-3 VIEWS RIGHT  Narrative CLINICAL DATA:  Hip pain  EXAM: DG HIP (WITH OR WITHOUT PELVIS) 2-3V RIGHT  COMPARISON:  None Available.  FINDINGS: There is no evidence of acute fracture. Alignment is normal. Minimal degenerative changes of the hips. Partially visualized  lumbosacral fixation hardware with fusion through the SI joints.  IMPRESSION: No evidence of acute osseous abnormality involving the right hip.   Electronically Signed By: Caprice Renshaw M.D. On: 04/21/2022 13:34   Narrative CLINICAL DATA:  Chronic left knee pain since last December. No injury or prior surgery.  EXAM: MRI OF THE LEFT KNEE WITHOUT CONTRAST  TECHNIQUE: Multiplanar, multisequence MR imaging of the knee was performed. No intravenous contrast was administered.  COMPARISON:  None.  FINDINGS: MENISCI  Medial meniscus:  Intact.  Lateral meniscus:  Radial tears of the anterior horn and midbody.  LIGAMENTS  Cruciates:  Intact ACL and PCL.  Collaterals: Medial collateral ligament is intact. Lateral collateral ligament complex is  intact.  CARTILAGE  Patellofemoral:  Mild diffuse cartilage thinning.  Medial: Diffuse cartilage thinning. High-grade partial-thickness cartilage loss over the central and posterior weight-bearing medial femoral condyle.  Lateral:  Mild diffuse cartilage thinning.  Joint:  Small joint effusion.  Normal Hoffa's fat.  Popliteal Fossa:  Tiny Baker cyst.  Intact popliteus tendon.  Extensor Mechanism: Intact quadriceps tendon and patellar tendon. Intact medial and lateral patellar retinaculum. Intact MPFL.  Bones: No focal marrow signal abnormality. No fracture or dislocation. Small tricompartmental marginal osteophytes.  Other: None.  IMPRESSION: 1. Radial tears of the lateral meniscus anterior horn and midbody. 2. Mild tricompartmental osteoarthritis. 3. Small joint effusion. Tiny Baker cyst.   Electronically Signed By: Obie Dredge M.D. On: 08/26/2020 15:19  MR KNEE RIGHT WO CONTRAST  Narrative CLINICAL DATA:  Medial and anterior knee pain since falling 4 months ago. No previous relevant surgery.  EXAM: MRI OF THE RIGHT KNEE WITHOUT CONTRAST  TECHNIQUE: Multiplanar, multisequence MR imaging of the knee was performed. No intravenous contrast was administered.  COMPARISON:  None.  FINDINGS: MENISCI  Medial meniscus: There is diffuse degenerative tearing of the meniscal body, best seen on the coronal images. There is a small oblique tear in the posterior horn, extending to both articular surfaces. The meniscal root is intact. No centrally displaced meniscal fragments are seen.  Lateral meniscus: Horizontal signal throughout the lateral meniscus without discrete tear.  LIGAMENTS  Cruciates:  Intact.  Collaterals: Intact. There is diffuse degenerative buckling of the medial collateral ligament related to medial osteophytes.  CARTILAGE  Patellofemoral:  Preserved.  Medial: Diffuse chondral thinning with prominent peripheral osteophytes and  subchondral edema in the medial femoral condyle and medial tibial plateau. No discrete subchondral insufficiency fracture.  Lateral:  Relatively preserved.  Small marginal osteophytes.  MISCELLANEOUS  Joint:  Small joint effusion.  Popliteal Fossa: There is a small typical Baker's cyst. In addition, there are additional synovial cysts posterior to the distal femur and prominent surrounding soft tissue edema. This edema may relate to synovial cyst rupture.  Extensor Mechanism: Intact. There is focal fluid anterior to the distal patellar tendon consistent with mild prepatellar bursitis.  Bones:  No acute or significant extra-articular osseous findings.  Other: No other significant periarticular soft tissue findings.  IMPRESSION: 1. Diffuse degenerative tearing of the body and posterior horn of the medial meniscus. 2. Underlying tricompartmental degenerative changes, most advanced in the medial compartment. No acute osseous findings. 3. The lateral meniscus, cruciate and collateral ligaments are intact. 4. Small joint effusion and small typical Baker's cyst. There are additional synovial cysts posterior to the distal femur and prominent surrounding soft tissue edema, likely related to cyst rupture.   Electronically Signed By: Carey Bullocks M.D. On: 07/20/2019 17:50   DG Knee 1-2 Views Right  Narrative CLINICAL DATA:  Post knee replacement  EXAM: RIGHT KNEE - 1-2 VIEW  COMPARISON:  None.  FINDINGS: Postoperative changes of right total knee arthroplasty. There is postoperative soft tissue swelling and air. No acute fracture or malalignment.  IMPRESSION: Standard postoperative appearance of right total knee arthroplasty.   Electronically Signed By: Guadlupe Spanish M.D. On: 01/01/2020 10:05   DG Knee 1-2 Views Left  Narrative CLINICAL DATA:  Postop pain  EXAM: LEFT KNEE - 1-2 VIEW  COMPARISON:  None.  FINDINGS: Left total knee arthroplasty.  Postoperative soft tissue swelling and air. Skin staples. No evidence of complication.  IMPRESSION: Standard appearance of left total knee arthroplasty.   Electronically Signed By: Guadlupe Spanish M.D. On: 10/12/2020 09:58  Ankle Imaging: Ankle-R DG Complete: Results for orders placed in visit on 04/02/23  DG Ankle Complete Right  Narrative CLINICAL DATA:  Lateral ankle and foot pain since injury 6 days ago. Swelling.  EXAM: RIGHT FOOT COMPLETE - 3+ VIEW; RIGHT ANKLE - COMPLETE 3+ VIEW  COMPARISON:  None Available.  FINDINGS: Right ankle:  The ankle mortise is symmetric and intact. Mild distal medial malleolar degenerative spurring. Minimal chronic enthesopathic change at the Achilles tendon insertion on the calcaneus, seen as a 4 mm chronic ossicle just proximal to the distal tendon insertion. No acute fracture or dislocation.  Right foot:  Mild hallux valgus. Minimal degenerative spurring at the peripheral aspect of the great toe metatarsophalangeal joint without joint space narrowing. Mild second tarsometatarsal joint space narrowing and subchondral sclerosis. There is chronic foreshortening of the distal aspect of the proximal phalanx of the fifth toe, possibly the sequela of remote trauma or prior surgery. No acute fracture or dislocation.  IMPRESSION: 1. No acute fracture. 2. Mild hallux valgus. 3. Mild second tarsometatarsal osteoarthritis.   Electronically Signed By: Neita Garnet M.D. On: 04/02/2023 11:27  Ankle-L DG Complete: No results found for this or any previous visit.   Foot Imaging: Foot-R DG Complete: Results for orders placed in visit on 04/02/23  DG Foot Complete Right  Narrative CLINICAL DATA:  Lateral ankle and foot pain since injury 6 days ago. Swelling.  EXAM: RIGHT FOOT COMPLETE - 3+ VIEW; RIGHT ANKLE - COMPLETE 3+ VIEW  COMPARISON:  None Available.  FINDINGS: Right ankle:  The ankle mortise is symmetric and intact.  Mild distal medial malleolar degenerative spurring. Minimal chronic enthesopathic change at the Achilles tendon insertion on the calcaneus, seen as a 4 mm chronic ossicle just proximal to the distal tendon insertion. No acute fracture or dislocation.  Right foot:  Mild hallux valgus. Minimal degenerative spurring at the peripheral aspect of the great toe metatarsophalangeal joint without joint space narrowing. Mild second tarsometatarsal joint space narrowing and subchondral sclerosis. There is chronic foreshortening of the distal aspect of the proximal phalanx of the fifth toe, possibly the sequela of remote trauma or prior surgery. No acute fracture or dislocation.  IMPRESSION: 1. No acute fracture. 2. Mild hallux valgus. 3. Mild second tarsometatarsal osteoarthritis.   Electronically Signed By: Neita Garnet M.D. On: 04/02/2023 11:27    Complexity Note: Imaging results reviewed.                         ROS  Cardiovascular: Daily Aspirin intake and High blood pressure Pulmonary or Respiratory: Shortness of breath and Temporary stoppage of breathing during sleep Neurological: incontinence Psychological-Psychiatric: No reported psychological or psychiatric signs or symptoms such as difficulty sleeping, anxiety, depression,  delusions or hallucinations (schizophrenial), mood swings (bipolar disorders) or suicidal ideations or attempts Gastrointestinal: No reported gastrointestinal signs or symptoms such as vomiting or evacuating blood, reflux, heartburn, alternating episodes of diarrhea and constipation, inflamed or scarred liver, or pancreas or irrregular and/or infrequent bowel movements Genitourinary: No reported renal or genitourinary signs or symptoms such as difficulty voiding or producing urine, peeing blood, non-functioning kidney, kidney stones, difficulty emptying the bladder, difficulty controlling the flow of urine, or chronic kidney disease Hematological: Weakness due  to low blood hemoglobin or red blood cell count (Anemia) Endocrine: No reported endocrine signs or symptoms such as high or low blood sugar, rapid heart rate due to high thyroid levels, obesity or weight gain due to slow thyroid or thyroid disease Rheumatologic: Joint aches and or swelling due to excess weight (Osteoarthritis) and Generalized muscle aches (Fibromyalgia) Musculoskeletal: Negative for myasthenia gravis, muscular dystrophy, multiple sclerosis or malignant hyperthermia Work History: Retired  Allergies  Erin Petersen is allergic to bee venom, fosamax [alendronate], fusion plus [iron-fa-b cmp-c-biot-probiotic], ibuprofen, ciprofloxacin hcl, and sulfa antibiotics.  Laboratory Chemistry Profile   Renal Lab Results  Component Value Date   BUN 22 05/01/2023   CREATININE 0.84 05/01/2023   BCR SEE NOTE: 08/04/2022   GFR 76.06 10/24/2022   GFRAA >60 06/07/2019   GFRNONAA >60 05/01/2023   SPECGRAV 1.020 02/10/2019   PHUR 6.0 02/10/2019   PROTEINUR NEGATIVE 07/26/2022     Electrolytes Lab Results  Component Value Date   NA 137 05/01/2023   K 3.5 05/01/2023   CL 99 05/01/2023   CALCIUM 9.5 05/01/2023   MG 2.3 06/07/2019   PHOS 2.8 06/07/2019     Hepatic Lab Results  Component Value Date   AST 29 05/01/2023   ALT 50 (H) 05/01/2023   ALBUMIN 4.5 05/01/2023   ALKPHOS 84 05/01/2023   LIPASE 22 08/04/2022     ID Lab Results  Component Value Date   SARSCOV2NAA NEGATIVE 10/08/2020   STAPHAUREUS NEGATIVE 04/06/2022   MRSAPCR NEGATIVE 04/06/2022     Bone Lab Results  Component Value Date   VD25OH 37.9 01/03/2022     Endocrine Lab Results  Component Value Date   GLUCOSE 119 (H) 05/01/2023   GLUCOSEU NEGATIVE 07/26/2022   HGBA1C 6.1 (H) 05/02/2023   TSH 1.51 08/10/2020     Neuropathy Lab Results  Component Value Date   VITAMINB12 242 04/10/2023   FOLATE 12.1 04/10/2023   HGBA1C 6.1 (H) 05/02/2023     CNS No results found for: "COLORCSF", "APPEARCSF",  "RBCCOUNTCSF", "WBCCSF", "POLYSCSF", "LYMPHSCSF", "EOSCSF", "PROTEINCSF", "GLUCCSF", "JCVIRUS", "CSFOLI", "IGGCSF", "LABACHR", "ACETBL"   Inflammation (CRP: Acute  ESR: Chronic) Lab Results  Component Value Date   CRP 3.3 08/04/2022   ESRSEDRATE 6 08/04/2022   LATICACIDVEN 0.9 06/03/2019     Rheumatology No results found for: "RF", "ANA", "LABURIC", "URICUR", "LYMEIGGIGMAB", "LYMEABIGMQN", "HLAB27"   Coagulation Lab Results  Component Value Date   INR 1.0 05/01/2023   LABPROT 13.4 05/01/2023   APTT 28 05/01/2023   PLT 270 05/01/2023     Cardiovascular Lab Results  Component Value Date   BNP 72.1 02/17/2021   HGB 14.1 05/01/2023   HCT 42.4 05/01/2023     Screening Lab Results  Component Value Date   SARSCOV2NAA NEGATIVE 10/08/2020   STAPHAUREUS NEGATIVE 04/06/2022   MRSAPCR NEGATIVE 04/06/2022     Cancer No results found for: "CEA", "CA125", "LABCA2"   Allergens No results found for: "ALMOND", "APPLE", "ASPARAGUS", "AVOCADO", "BANANA", "BARLEY", "BASIL", "BAYLEAF", "GREENBEAN", "LIMABEAN", "  WHITEBEAN", "BEEFIGE", "REDBEET", "BLUEBERRY", "BROCCOLI", "CABBAGE", "MELON", "CARROT", "CASEIN", "CASHEWNUT", "CAULIFLOWER", "CELERY"     Note: Lab results reviewed.  PFSH  Drug: Erin Petersen  reports no history of drug use. Alcohol:  reports current alcohol use. Tobacco:  reports that she has never smoked. She has never used smokeless tobacco. Medical:  has a past medical history of Allergy (1955), Anemia, Arthritis, Asthma, Bacteremia due to Gram-negative bacteria (06/04/2019), Blood transfusion without reported diagnosis (February 2024), CAD (coronary artery disease) (07/21/2016), Colon polyps, Complication of anesthesia, Compression fracture of L1 lumbar vertebra (HCC), COPD (chronic obstructive pulmonary disease) (HCC), Depression, Diastolic dysfunction, Duodenal ulcer, Dyspnea, GERD (gastroesophageal reflux disease), History of 2019 novel coronavirus disease (COVID-19)  (01/19/2021), History of lumbar fusion, Hyperlipidemia, Hypertension, Insomnia, OSA on CPAP, Osteopenia, Peripheral neuropathy, Plantar fasciitis, Pneumonia, Pulmonary hypertension (HCC), Right inguinal hernia, RLS (restless legs syndrome), Sleep apnea (2009), SOB (shortness of breath), T12 compression fracture (HCC), TIA (transient ischemic attack), Umbilical hernia, Urinary incontinence, Urinary tract infection, and Wears hearing aid in both ears. Family: family history includes Alcohol abuse in her father and mother; Arthritis in her daughter, paternal grandmother, and sister; Asthma in her paternal grandmother; Birth defects in her daughter; Breast cancer in her paternal aunt; COPD in her daughter and paternal grandmother; Cancer in her father and mother; Stroke in her mother.  Past Surgical History:  Procedure Laterality Date   ABDOMINAL HYSTERECTOMY  1972   APPENDECTOMY  1958   APPLICATION OF INTRAOPERATIVE CT SCAN N/A 04/17/2022   Procedure: APPLICATION OF INTRAOPERATIVE CT SCAN;  Surgeon: Venetia Night, MD;  Location: [facility];  Service: Neurosurgery;  Laterality: N/A;   BILATERAL SALPINGOOPHORECTOMY  01/07/2018   BIOPSY  04/15/2019   Procedure: BIOPSY;  Surgeon: Toney Reil, MD;  Location: [facility];  Service: Endoscopy;;   CARDIAC CATHETERIZATION Left 07/21/2016   COLONOSCOPY WITH PROPOFOL N/A 11/11/2018   Procedure: COLONOSCOPY WITH PROPOFOL;  Surgeon: Toney Reil, MD;  Location: [facility];  Service: Gastroenterology;  Laterality: N/A;   CYSTO WITH HYDRODISTENSION N/A 12/30/2018   Procedure: CYSTOSCOPY/HYDRODISTENSION WITH MARCAINE;  Surgeon: Alfredo Martinez, MD;  Location: [facility];  Service: Urology;  Laterality: N/A;   ESOPHAGOGASTRODUODENOSCOPY (EGD) WITH PROPOFOL N/A 04/15/2019   Procedure: ESOPHAGOGASTRODUODENOSCOPY (EGD) WITH PROPOFOL;  Surgeon: Toney Reil, MD;  Location: [facility];  Service: Endoscopy;  Laterality: N/A;   sleep apnea   ESOPHAGOGASTRODUODENOSCOPY (EGD) WITH PROPOFOL N/A 07/09/2019   Procedure: ESOPHAGOGASTRODUODENOSCOPY (EGD) WITH PROPOFOL;  Surgeon: Toney Reil, MD;  Location: [facility];  Service: Gastroenterology;  Laterality: N/A;   ESOPHAGOGASTRODUODENOSCOPY (EGD) WITH PROPOFOL N/A 06/15/2022   Procedure: ESOPHAGOGASTRODUODENOSCOPY (EGD) WITH PROPOFOL;  Surgeon: Toney Reil, MD;  Location: [facility];  Service: Endoscopy;  Laterality: N/A;  sleep apnea   EYE SURGERY Bilateral    INGUINAL HERNIA REPAIR Bilateral 01/05/2017   JOINT REPLACEMENT     LAPAROSCOPIC BILATERAL SALPINGO OOPHERECTOMY  2019   LUMBAR FUSION  2002   LUMBAR FUSION  11/14/2021   Dr Sharolyn Douglas   LUMBAR FUSION  12/14/2021   Dr Malachy Chamber   SPINAL FUSION  2002   TONSILLECTOMY AND ADENOIDECTOMY  1961   TOTAL KNEE ARTHROPLASTY Right 01/01/2020   Procedure: Right total knee arthroplasty - [place] Neighbor to Assist;  Surgeon: Kennedy Bucker, MD;  Location: [facility];  Service: Orthopedics;  Laterality: Right;   TOTAL KNEE ARTHROPLASTY Left 10/12/2020   Procedure: TOTAL KNEE ARTHROPLASTY;  Surgeon: Kennedy Bucker, MD;  Location: [facility];  Service: Orthopedics;  Laterality: Left;   TUBAL LIGATION     UMBILICAL HERNIA REPAIR N/A 01/05/2017   Active Ambulatory Problems    Diagnosis Date Noted   Allergic rhinitis 10/08/2018   Benign essential hypertension 10/08/2018   Neuropathy 10/08/2018   OSA on CPAP 12/29/2016   Osteoarthritis 10/08/2018   Other and unspecified disc disorder of lumbar region 10/25/2000   Adverse reaction to influenza vaccine, initial encounter 12/03/2018   Prediabetes 09/24/1998   Restless legs syndrome 11/28/2018   Chronic dyspnea 06/24/2012   Tinnitus of both ears 09/24/1983   Vitamin D deficiency 12/03/2018   Asthma 01/31/2019   Osteoporosis 02/12/2019   High-tone pelvic floor dysfunction 04/03/2019   IC (interstitial cystitis) 04/03/2019   Neuralgia of both pudendal  nerves 04/03/2019   Pelvic pain in female 04/03/2019   Estrogen deficiency 04/22/2019   History of skin cancer 04/22/2019   History of vaginal hysterectomy 06/11/2019   S/P BSO (bilateral salpingo-oophorectomy) 01/07/2018   S/P TKR (total knee replacement) using cement, right 01/01/2020   Migraine 03/12/2020   IBS (irritable bowel syndrome) 03/12/2020   Encounter for screening mammogram for breast cancer 08/25/2020   Encounter for general adult medical examination with abnormal findings 08/25/2020   GERD (gastroesophageal reflux disease) 08/25/2020   Dizzy spells 08/25/2020   Somnolence, daytime 09/09/2020   S/P TKR (total knee replacement) using cement, left 10/12/2020   Hyperlipidemia 11/17/2020   Pedal edema 07/26/2021   Mild pulmonary hypertension (HCC) 07/26/2021   Constipation due to pain medication 12/28/2021   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   Back pain at L4-L5 level 12/01/2021   Colonic polyp 10/22/2019   Chronic obstructive pulmonary disease (HCC) 12/29/2016   Diverticulosis of colon 10/25/2000   Inguinal hernia of right side without obstruction or gangrene 11/15/2021   Other secondary scoliosis, lumbar region 10/04/2021   Umbilical hernia without obstruction or gangrene 11/15/2021   Lumbar post-laminectomy syndrome 10/04/2021   Abdominal wall bulge 01/31/2022   Iron deficiency anemia 01/31/2022   Pseudoarthrosis of thoracic spine after fusion procedure 04/17/2022   Other secondary kyphosis, thoracic region 04/17/2022   Compression fracture of T12 vertebra (HCC) 04/17/2022   Spinal instability, thoracic 04/17/2022   History of duodenal ulcer 06/15/2022   Headache 08/02/2022   Epigastric pain 08/02/2022   Laceration of left hand 11/09/2022   Pain and swelling of right ankle 04/02/2023   Stroke-like symptom 05/01/2023   Pain in right toe(s) 05/01/2023   Inflammation of interphalangeal joint of right toe due to infection (HCC) 05/01/2023   Fatty liver  05/04/2023   Infection of great toe 05/04/2023   TIA (transient ischemic attack) 05/04/2023   Class 2 obesity due to excess calories with body mass index (BMI) of 35.0 to 35.9 in adult 05/04/2023   Chronic radicular lumbar pain 06/07/2023   Status post lumbar spinal fusion 06/07/2023   History of thoracic spinal fusion 06/07/2023   Chronic pain syndrome 06/07/2023   Resolved Ambulatory Problems    Diagnosis Date Noted   Chronic bladder pain 10/08/2018   RLQ abdominal pain    Small fiber neuropathy 11/28/2018   Duodenal ulcer    DM type 2 (diabetes mellitus, type 2) (HCC) 04/22/2019   Urinary frequency 04/22/2019   Pyelonephritis of right kidney 06/03/2019   Gastroenteritis 06/03/2019   Hypokalemia 06/03/2019   Hyponatremia 06/03/2019   Pyelonephritis 06/04/2019   Bacteremia due to Gram-negative bacteria 06/04/2019   Medicare annual wellness visit, subsequent 08/25/2020   Rash 09/09/2020   Coronary artery disease involving  native coronary artery of native heart 10/07/2021   Hypertension 11/15/2021   Eye swelling, bilateral 07/28/2022   Nausea 08/02/2022   Blister, face 08/02/2022   URI with cough and congestion 10/24/2022   Acute bronchitis 10/24/2022   Strep throat 10/24/2022   Past Medical History:  Diagnosis Date   Allergy 1956   Anemia    Arthritis    Blood transfusion without reported diagnosis February 2024   CAD (coronary artery disease) 07/21/2016   Colon polyps    Complication of anesthesia    Compression fracture of L1 lumbar vertebra (HCC)    COPD (chronic obstructive pulmonary disease) (HCC)    Depression    Diastolic dysfunction    Dyspnea    History of 2019 novel coronavirus disease (COVID-19) 01/19/2021   History of lumbar fusion    Insomnia    Osteopenia    Peripheral neuropathy    Plantar fasciitis    Pneumonia    Pulmonary hypertension (HCC)    Right inguinal hernia    RLS (restless legs syndrome)    Sleep apnea 2009   SOB (shortness of  breath)    T12 compression fracture (HCC)    Umbilical hernia    Urinary incontinence    Urinary tract infection    Wears hearing aid in both ears    Constitutional Exam  General appearance: Well nourished, well developed, and well hydrated. In no apparent acute distress Vitals:   06/07/23 1404  BP: 139/78  Pulse: 75  Resp: 18  Temp: (!) 97.2 F (36.2 C)  TempSrc: Temporal  SpO2: 99%  Weight: 175 lb (79.4 kg)  Height: 4\' 9"  (1.448 m)   BMI Assessment: Estimated body mass index is 37.87 kg/m as calculated from the following:   Height as of this encounter: 4\' 9"  (1.448 m).   Weight as of this encounter: 175 lb (79.4 kg).  BMI interpretation table: BMI level Category Range association with higher incidence of chronic pain  <18 kg/m2 Underweight   18.5-24.9 kg/m2 Ideal body weight   25-29.9 kg/m2 Overweight Increased incidence by 20%  30-34.9 kg/m2 Obese (Class I) Increased incidence by 68%  35-39.9 kg/m2 Severe obesity (Class II) Increased incidence by 136%  >40 kg/m2 Extreme obesity (Class III) Increased incidence by 254%   Patient's current BMI Ideal Body weight  Body mass index is 37.87 kg/m. Patient must be at least 60 in tall to calculate ideal body weight   BMI Readings from Last 4 Encounters:  06/07/23 37.87 kg/m  05/07/23 38.30 kg/m  05/04/23 35.75 kg/m  05/01/23 35.95 kg/m   Wt Readings from Last 4 Encounters:  06/07/23 175 lb (79.4 kg)  05/07/23 177 lb (80.3 kg)  05/04/23 177 lb (80.3 kg)  05/01/23 178 lb (80.7 kg)    Psych/Mental status: Alert, oriented x 3 (person, place, & time)       Eyes: PERLA Respiratory: No evidence of acute respiratory distress  Thoracic Spine Area Exam  Skin & Axial Inspection: Well healed scar from previous spine surgery detected Alignment: Symmetrical Functional ROM: Pain restricted ROM Stability: No instability detected Muscle Tone/Strength: Functionally intact. No obvious neuro-muscular anomalies detected. Sensory  (Neurological): Neurogenic pain pattern Muscle strength & Tone: No palpable anomalies Lumbar Spine Area Exam  Skin & Axial Inspection: Well healed scar from previous spine surgery detected Alignment: Symmetrical Functional ROM: Pain restricted ROM affecting both sides Stability: No instability detected Muscle Tone/Strength: Functionally intact. No obvious neuro-muscular anomalies detected. Sensory (Neurological): Dermatomal pain pattern Palpation: No palpable  anomalies       Provocative Tests: Hyperextension/rotation test: Unable to perform due to fusion restriction.  Gait & Posture Assessment  Ambulation: Limited Gait: Antalgic Posture: Difficulty standing up straight, due to pain  Lower Extremity Exam    Side: Right lower extremity  Side: Left lower extremity  Stability: No instability observed          Stability: No instability observed          Skin & Extremity Inspection: Skin color, temperature, and hair growth are WNL. No peripheral edema or cyanosis. No masses, redness, swelling, asymmetry, or associated skin lesions. No contractures.  Skin & Extremity Inspection: Skin color, temperature, and hair growth are WNL. No peripheral edema or cyanosis. No masses, redness, swelling, asymmetry, or associated skin lesions. No contractures.  Functional ROM: Pain restricted ROM for hip and knee joints          Functional ROM: Pain restricted ROM for hip and knee joints          Muscle Tone/Strength: Functionally intact. No obvious neuro-muscular anomalies detected.  Muscle Tone/Strength: Functionally intact. No obvious neuro-muscular anomalies detected.  Sensory (Neurological): Neurogenic pain pattern        Sensory (Neurological): Neurogenic pain pattern        DTR: Patellar: deferred today Achilles: deferred today Plantar: deferred today  DTR: Patellar: deferred today Achilles: deferred today Plantar: deferred today  Palpation: No palpable anomalies  Palpation: No palpable anomalies     Assessment  Primary Diagnosis & Pertinent Problem List: The primary encounter diagnosis was Chronic radicular lumbar pain. Diagnoses of Failed back surgical syndrome, Lumbar post-laminectomy syndrome, Status post lumbar spinal fusion, History of thoracic spinal fusion, and Chronic pain syndrome were also pertinent to this visit.  Visit Diagnosis (New problems to examiner): 1. Chronic radicular lumbar pain   2. Failed back surgical syndrome   3. Lumbar post-laminectomy syndrome   4. Status post lumbar spinal fusion   5. History of thoracic spinal fusion   6. Chronic pain syndrome    Plan of Care (Initial workup plan)  Assessment and Plan    Chronic back pain post spinal fusion   She has undergone three back surgeries in the past year, including a complex spinal fusion from T10 to the sacrum, and continues to experience significant chronic back pain, especially after standing or being active. Extensive fusion hardware complicates potential interventions such as intraceptive procedure or spinal cord stimulator trials, which are high risk and technically challenging due to the hardware and scar tissue. Conservative management with medication is the primary option available. The Butrans patch (buprenorphine) is considered a safer alternative to opioids like fentanyl, oxycodone, or morphine, with less side effect potential. Initiate Butrans patch at 7.5 mcg/hour for one month, then increase to 10 mcg/hour in the second month. Send prescription to [facility] pharmacy. Advise her to alternate patch sites weekly and avoid using a heating pad over the patch.        Imaging Orders         DG PAIN CLINIC C-ARM 1-60 MIN NO REPORT     Pharmacotherapy (current): Medications ordered:  Meds ordered this encounter  Medications   buprenorphine (BUTRANS) 7.5 MCG/HR    Sig: Place 1 patch onto the skin once a week for 28 days.    Dispense:  4 patch    Refill:  0    Chronic Pain: STOP Act (Not  applicable) Fill 1 day early if closed on refill date. Avoid benzodiazepines  within 8 hours of opioids   buprenorphine (BUTRANS) 10 MCG/HR PTWK    Sig: Place 1 patch onto the skin once a week for 28 days.    Dispense:  4 patch    Refill:  0    Chronic Pain: STOP Act (Not applicable) Fill 1 day early if closed on refill date. Avoid benzodiazepines within 8 hours of opioids   Medications administered during this visit: Erin Petersen had no medications administered during this visit.  Provider-requested follow-up: Return in about 8 weeks (around 08/02/2023) for MM, F2F.  Future Appointments  Date Time Provider Department Center  08/08/2023  9:00 AM Freda Munro A, MD [facility] None  08/09/2023  1:00 PM [facility] None  08/16/2023  9:40 AM McDonough, Salomon Fick, PA-C [facility] None  08/23/2023  9:40 AM Edward Jolly, MD [facility] None  10/04/2023  9:30 AM [facility] ANNUAL WELLNESS VISIT 1 [facility]  12/07/2023  1:00 PM [facility] None  12/07/2023  1:15 PM Creig Hines, MD [facility] None   I discussed the assessment and treatment plan with the patient. The patient was provided an opportunity to ask questions and all were answered. The patient agreed with the plan and demonstrated an understanding of the instructions.  Patient advised to call back or seek an in-person evaluation if the symptoms or condition worsens.  Duration of encounter:30minutes.  Total time on encounter, as per AMA guidelines included both the face-to-face and non-face-to-face time personally spent by the physician and/or other qualified health care professional(s) on the day of the encounter (includes time in activities that require the physician or other qualified health care professional and does not include time in activities normally performed by clinical staff). Physician's time may include the following activities when performed: Preparing to see the patient (e.g., pre-charting review of records,  searching for previously ordered imaging, lab work, and nerve conduction tests) Review of prior analgesic pharmacotherapies. Reviewing PMP Interpreting ordered tests (e.g., lab work, imaging, nerve conduction tests) Performing post-procedure evaluations, including interpretation of diagnostic procedures Obtaining and/or reviewing separately obtained history Performing a medically appropriate examination and/or evaluation Counseling and educating the patient/family/caregiver Ordering medications, tests, or procedures Referring and communicating with other health care professionals (when not separately reported) Documenting clinical information in the electronic or other health record Independently interpreting results (not separately reported) and communicating results to the patient/ family/caregiver Care coordination (not separately reported)  Note by: Edward Jolly, MD (TTS and AI technology used. I apologize for any typographical errors that were not detected and corrected.) Date: 06/07/2023; Time: 3:17 PM

## 2023-06-08 MED ORDER — TIRZEPATIDE-WEIGHT MANAGEMENT 2.5 MG/0.5ML ~~LOC~~ SOLN: 2.5000 mg | SUBCUTANEOUS | 0 refills | Status: DC

## 2023-06-11 ENCOUNTER — Telehealth: Payer: Self-pay

## 2023-06-11 ENCOUNTER — Other Ambulatory Visit (HOSPITAL_COMMUNITY): Payer: Self-pay

## 2023-06-11 NOTE — Telephone Encounter (Signed)
 Will route to PA dpt

## 2023-06-11 NOTE — Telephone Encounter (Signed)
 Pharmacy Patient Advocate Encounter   Received notification from Onbase that prior authorization for Zepbound 2.5MG /0.5ML solution is required/requested.   Insurance verification completed.   The patient is insured through [facility] .   Per test claim: PA required; PA submitted to above mentioned insurance via CoverMyMeds Key/confirmation #/EOC [id number] Status is pending

## 2023-06-12 ENCOUNTER — Telehealth: Payer: Self-pay | Admitting: Family Medicine

## 2023-06-12 ENCOUNTER — Other Ambulatory Visit (HOSPITAL_COMMUNITY): Payer: Self-pay

## 2023-06-12 DIAGNOSIS — M25571 Pain in right ankle and joints of right foot: Secondary | ICD-10-CM | POA: Diagnosis not present

## 2023-06-12 DIAGNOSIS — R2681 Unsteadiness on feet: Secondary | ICD-10-CM | POA: Diagnosis not present

## 2023-06-12 DIAGNOSIS — R2689 Other abnormalities of gait and mobility: Secondary | ICD-10-CM | POA: Diagnosis not present

## 2023-06-12 MED ORDER — TIRZEPATIDE-WEIGHT MANAGEMENT 2.5 MG/0.5ML ~~LOC~~ SOAJ: 2.5000 mg | SUBCUTANEOUS | 0 refills | Status: DC

## 2023-06-12 NOTE — Telephone Encounter (Signed)
 Pharmacy Patient Advocate Encounter  Received notification from [facility] Medicare that Prior Authorization for Zepbound 2.5MG /0.5ML solution has been APPROVED from 06/11/23 to 06/10/24. Ran test claim, Copay is $635.48. This test claim was processed through [facility]- copay amounts may vary at other pharmacies due to pharmacy/plan contracts, or as the patient moves through the different stages of their insurance plan.   PA #/Case ID/Reference #:  [id number]

## 2023-06-12 NOTE — Telephone Encounter (Signed)
 Sent mychart letting pt know PA was approved

## 2023-06-12 NOTE — Telephone Encounter (Signed)
 Pt advised.

## 2023-06-12 NOTE — Addendum Note (Signed)
 Addended by: Deri Fleet A on: 06/12/2023 08:41 PM   Modules accepted: Orders

## 2023-06-12 NOTE — Telephone Encounter (Signed)
 Copied from CRM [id number]. Topic: General - Other >> Jun 12, 2023  9:14 AM Jenice Mitts wrote: Reason for CRM: Sherline Distel from [facility] is calling to let us  know zepbound has been approved for patient effective 06/11/2023-06/10/2024

## 2023-06-15 ENCOUNTER — Telehealth: Payer: Self-pay | Admitting: *Deleted

## 2023-06-15 ENCOUNTER — Encounter: Payer: Self-pay | Admitting: Student in an Organized Health Care Education/Training Program

## 2023-06-15 NOTE — Telephone Encounter (Signed)
 Spoke with patient after reading message from patient advise message.  She has not received her Rx from [facility] and is needing to reschedule her f/up appt.  I am going to try and figure out what they need in order to fill Rx and let the patient know what I find.

## 2023-06-18 ENCOUNTER — Telehealth: Payer: Self-pay

## 2023-06-18 NOTE — Telephone Encounter (Signed)
 [employer] needs more clinical information to approve the patches. They need formal consultation evaluation, DX,  medical history, previous and current pharmacological treatment. You can call them at  [phone] option 5.

## 2023-06-18 NOTE — Telephone Encounter (Signed)
 Notes sent to [facility] with information that they were requesting.

## 2023-06-19 DIAGNOSIS — M25571 Pain in right ankle and joints of right foot: Secondary | ICD-10-CM | POA: Diagnosis not present

## 2023-06-19 DIAGNOSIS — L821 Other seborrheic keratosis: Secondary | ICD-10-CM | POA: Diagnosis not present

## 2023-06-19 DIAGNOSIS — R2681 Unsteadiness on feet: Secondary | ICD-10-CM | POA: Diagnosis not present

## 2023-06-19 DIAGNOSIS — R2689 Other abnormalities of gait and mobility: Secondary | ICD-10-CM | POA: Diagnosis not present

## 2023-06-20 ENCOUNTER — Encounter: Payer: Self-pay | Admitting: Student in an Organized Health Care Education/Training Program

## 2023-06-21 DIAGNOSIS — R2689 Other abnormalities of gait and mobility: Secondary | ICD-10-CM | POA: Diagnosis not present

## 2023-06-21 DIAGNOSIS — M25571 Pain in right ankle and joints of right foot: Secondary | ICD-10-CM | POA: Diagnosis not present

## 2023-06-21 DIAGNOSIS — R2681 Unsteadiness on feet: Secondary | ICD-10-CM | POA: Diagnosis not present

## 2023-06-22 ENCOUNTER — Telehealth: Payer: Self-pay | Admitting: Student in an Organized Health Care Education/Training Program

## 2023-06-22 NOTE — Telephone Encounter (Signed)
 Called and spoke to [facility] and answered questions along with sending notes by fax.

## 2023-06-22 NOTE — Telephone Encounter (Signed)
 [employer] phone [phone]  fax [phone] Called stating they need more info to approve Bupronorphine

## 2023-06-26 ENCOUNTER — Telehealth: Payer: Self-pay

## 2023-06-26 DIAGNOSIS — R2681 Unsteadiness on feet: Secondary | ICD-10-CM | POA: Diagnosis not present

## 2023-06-26 DIAGNOSIS — M25571 Pain in right ankle and joints of right foot: Secondary | ICD-10-CM | POA: Diagnosis not present

## 2023-06-26 DIAGNOSIS — R2689 Other abnormalities of gait and mobility: Secondary | ICD-10-CM | POA: Diagnosis not present

## 2023-06-26 NOTE — Telephone Encounter (Addendum)
 They are filling the butrane patches, but patient is also requesting the Lyrica  ordered by another doctor. There is a slight interaction so they want to know if Dr. Rhesa Celeste is ok with this. Please call [phone] Ref [phone]

## 2023-06-26 NOTE — Telephone Encounter (Signed)
 Pharmacy notified.

## 2023-06-28 DIAGNOSIS — R2689 Other abnormalities of gait and mobility: Secondary | ICD-10-CM | POA: Diagnosis not present

## 2023-06-28 DIAGNOSIS — R2681 Unsteadiness on feet: Secondary | ICD-10-CM | POA: Diagnosis not present

## 2023-06-28 DIAGNOSIS — M25571 Pain in right ankle and joints of right foot: Secondary | ICD-10-CM | POA: Diagnosis not present

## 2023-07-02 ENCOUNTER — Other Ambulatory Visit: Payer: Self-pay | Admitting: Family Medicine

## 2023-07-05 ENCOUNTER — Encounter: Payer: Self-pay | Admitting: Family Medicine

## 2023-07-05 ENCOUNTER — Ambulatory Visit: Admitting: Family Medicine

## 2023-07-05 ENCOUNTER — Telehealth: Payer: Self-pay

## 2023-07-05 ENCOUNTER — Ambulatory Visit (INDEPENDENT_AMBULATORY_CARE_PROVIDER_SITE_OTHER): Admitting: Family Medicine

## 2023-07-05 ENCOUNTER — Other Ambulatory Visit (HOSPITAL_COMMUNITY): Payer: Self-pay

## 2023-07-05 VITALS — BP 130/70 | HR 68 | Temp 97.6°F | Ht <= 58 in | Wt 176.0 lb

## 2023-07-05 DIAGNOSIS — G4733 Obstructive sleep apnea (adult) (pediatric): Secondary | ICD-10-CM | POA: Diagnosis not present

## 2023-07-05 DIAGNOSIS — Z6838 Body mass index (BMI) 38.0-38.9, adult: Secondary | ICD-10-CM

## 2023-07-05 DIAGNOSIS — G894 Chronic pain syndrome: Secondary | ICD-10-CM

## 2023-07-05 DIAGNOSIS — E66812 Obesity, class 2: Secondary | ICD-10-CM

## 2023-07-05 DIAGNOSIS — I10 Essential (primary) hypertension: Secondary | ICD-10-CM

## 2023-07-05 DIAGNOSIS — I272 Pulmonary hypertension, unspecified: Secondary | ICD-10-CM

## 2023-07-05 DIAGNOSIS — R7303 Prediabetes: Secondary | ICD-10-CM | POA: Diagnosis not present

## 2023-07-05 MED ORDER — ZEPBOUND 5 MG/0.5ML ~~LOC~~ SOAJ
5.0000 mg | SUBCUTANEOUS | 0 refills | Status: DC
Start: 2023-07-05 — End: 2023-07-16

## 2023-07-05 NOTE — Assessment & Plan Note (Signed)
 Working on weight loss with diet/exercise and zepbound 

## 2023-07-05 NOTE — Assessment & Plan Note (Signed)
 Being treated for osa now as well

## 2023-07-05 NOTE — Progress Notes (Signed)
 Subjective:    Patient ID: Bartholomew Boros, female    DOB: [birth year], 83 y.o.   MRN: [id number]  HPI  Wt Readings from Last 3 Encounters:  07/05/23 176 lb (79.8 kg)  06/07/23 175 lb (79.4 kg)  05/07/23 177 lb (80.3 kg)   38.09 kg/m  Highest weight 178 lb in January  Vitals:   07/05/23 1119  BP: 130/70  Pulse: 68  Temp: 97.6 F (36.4 C)  SpO2: 98%    Pt presents for follow up of obesity with co morbidities  treatment/taking zepbound    Bmi of 38.09 Has OSA , prediabetes and fatty liver as well as HTN    Started glp-1 / zepbound  in march  2.5 mg weekly (last prescription 4/15)   No side effects at all  No nausea or vomiting  Is starting to feel more full when eating  Is eating less   Lost 2 lb per her scale   Hard to eat well when traveling    Planning to travel  2 grandsons are graduating - going to [place] -gone until June 23 Has 4th injection today   Leaves tomorrow      Lab Results  Component Value Date   NA 137 05/01/2023   K 3.5 05/01/2023   CO2 28 05/01/2023   GLUCOSE 119 (H) 05/01/2023   BUN 22 05/01/2023   CREATININE 0.84 05/01/2023   CALCIUM 9.5 05/01/2023   GFR 76.06 10/24/2022   GFRNONAA >60 05/01/2023   Lab Results  Component Value Date   ALT 50 (H) 05/01/2023   AST 29 05/01/2023   ALKPHOS 84 05/01/2023   BILITOT 0.5 05/01/2023   Lab Results  Component Value Date   HGBA1C 6.1 (H) 05/02/2023   Lab Results  Component Value Date   CHOL 125 05/02/2023   HDL 34 (L) 05/02/2023   LDLCALC 68 05/02/2023   LDLDIRECT 90.0 08/10/2020   TRIG 113 05/02/2023   CHOLHDL 3.7 05/02/2023   Going to pain management now  Will be starting buprenorphine  patch   Will be changing the pregabalin  dose soon to 200 mg in am      Patient Active Problem List   Diagnosis Date Noted   Chronic radicular lumbar pain 06/07/2023   Status post lumbar spinal fusion 06/07/2023   History of thoracic spinal fusion 06/07/2023   Chronic pain syndrome  06/07/2023   Fatty liver 05/04/2023   Infection of great toe 05/04/2023   TIA (transient ischemic attack) 05/04/2023   Class 2 severe obesity due to excess calories with serious comorbidity and body mass index (BMI) of 38.0 to 38.9 in adult (HCC) 05/04/2023   Stroke-like symptom 05/01/2023   Pain in right toe(s) 05/01/2023   Inflammation of interphalangeal joint of right toe due to infection (HCC) 05/01/2023   Pain and swelling of right ankle 04/02/2023   Laceration of left hand 11/09/2022   Headache 08/02/2022   Epigastric pain 08/02/2022   History of duodenal ulcer 06/15/2022   Pseudoarthrosis of thoracic spine after fusion procedure 04/17/2022   Other secondary kyphosis, thoracic region 04/17/2022   Compression fracture of T12 vertebra (HCC) 04/17/2022   Spinal instability, thoracic 04/17/2022   Abdominal wall bulge 01/31/2022   Iron  deficiency anemia 01/31/2022   Constipation due to pain medication 12/28/2021   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   Back pain at L4-L5 level 12/01/2021   Inguinal hernia of right side without obstruction or gangrene 11/15/2021   Umbilical hernia without obstruction or gangrene 11/15/2021  Other secondary scoliosis, lumbar region 10/04/2021   Lumbar post-laminectomy syndrome 10/04/2021   Pedal edema 07/26/2021   Mild pulmonary hypertension (HCC) 07/26/2021   Hyperlipidemia 11/17/2020   S/P TKR (total knee replacement) using cement, left 10/12/2020   Somnolence, daytime 09/09/2020   Encounter for screening mammogram for breast cancer 08/25/2020   Encounter for general adult medical examination with abnormal findings 08/25/2020   GERD (gastroesophageal reflux disease) 08/25/2020   Dizzy spells 08/25/2020   Migraine 03/12/2020   IBS (irritable bowel syndrome) 03/12/2020   S/P TKR (total knee replacement) using cement, right 01/01/2020   Colonic polyp 10/22/2019   History of vaginal hysterectomy 06/11/2019   Estrogen deficiency 04/22/2019    History of skin cancer 04/22/2019   High-tone pelvic floor dysfunction 04/03/2019   IC (interstitial cystitis) 04/03/2019   Neuralgia of both pudendal nerves 04/03/2019   Pelvic pain in female 04/03/2019   Osteoporosis 02/12/2019   Asthma 01/31/2019   Adverse reaction to influenza vaccine, initial encounter 12/03/2018   Vitamin D  deficiency 12/03/2018   Restless legs syndrome 11/28/2018   Allergic rhinitis 10/08/2018   Benign essential hypertension 10/08/2018   Neuropathy 10/08/2018   Osteoarthritis 10/08/2018   S/P BSO (bilateral salpingo-oophorectomy) 01/07/2018   OSA on CPAP 12/29/2016   Chronic obstructive pulmonary disease (HCC) 12/29/2016   Chronic dyspnea 06/24/2012   Other and unspecified disc disorder of lumbar region 10/25/2000   Diverticulosis of colon 10/25/2000   Prediabetes 09/24/1998   Tinnitus of both ears 09/24/1983   Past Medical History:  Diagnosis Date   Allergy 1956   Anemia 2022   Arthritis 2002   Asthma    Bacteremia due to Gram-negative bacteria 06/04/2019   Blood transfusion without reported diagnosis February 2024   CAD (coronary artery disease) 07/21/2016   a.) LHC 07/21/2016: mild nonobstructive CAD   Colon polyps    Complication of anesthesia    a.) anesthesia awareness   Compression fracture of L1 lumbar vertebra (HCC)    COPD (chronic obstructive pulmonary disease) (HCC)    Depression    Diastolic dysfunction    a.) TTE 03/02/2021: EF >55%, mild LVH, mild LAE, triv MR, mild TR/PR, G1DD   Duodenal ulcer    Dyspnea    GERD (gastroesophageal reflux disease) 2002   History of 2019 novel coronavirus disease (COVID-19) 01/19/2021   a.) tested (+) at [facility]   History of lumbar fusion    a.) T12-S1   Hyperlipidemia    Hypertension 1984   Insomnia    a.) takes melatonin PRN   OSA on CPAP    Osteopenia    Peripheral neuropathy    Plantar fasciitis    Pneumonia    Pulmonary hypertension (HCC)    a.) TTE 06/04/2019: moderate (PASP 40.3  mmHg)   Right inguinal hernia    a.) s/p repair 12/2016   RLS (restless legs syndrome)    a.) on ropinirole    Sleep apnea 2009   SOB (shortness of breath)    T12 compression fracture (HCC)    TIA (transient ischemic attack)    pt unsure how many, had MRIs here showing this   Umbilical hernia    a.) s/p repair 12/2016   Urinary incontinence    Urinary tract infection    Wears hearing aid in both ears    Past Surgical History:  Procedure Laterality Date   ABDOMINAL HYSTERECTOMY  1972   APPENDECTOMY  1958   APPLICATION OF INTRAOPERATIVE CT SCAN N/A 04/17/2022   Procedure: APPLICATION OF  INTRAOPERATIVE CT SCAN;  Surgeon: Jodeen Munch, MD;  Location: [facility];  Service: Neurosurgery;  Laterality: N/A;   BILATERAL SALPINGOOPHORECTOMY  01/07/2018   BIOPSY  04/15/2019   Procedure: BIOPSY;  Surgeon: Selena Daily, MD;  Location: [facility];  Service: Endoscopy;;   CARDIAC CATHETERIZATION Left 07/21/2016   COLONOSCOPY WITH PROPOFOL  N/A 11/11/2018   Procedure: COLONOSCOPY WITH PROPOFOL ;  Surgeon: Selena Daily, MD;  Location: [facility];  Service: Gastroenterology;  Laterality: N/A;   CYSTO WITH HYDRODISTENSION N/A 12/30/2018   Procedure: CYSTOSCOPY/HYDRODISTENSION WITH MARCAINE ;  Surgeon: Erman Hayward, MD;  Location: [facility];  Service: Urology;  Laterality: N/A;   ESOPHAGOGASTRODUODENOSCOPY (EGD) WITH PROPOFOL  N/A 04/15/2019   Procedure: ESOPHAGOGASTRODUODENOSCOPY (EGD) WITH PROPOFOL ;  Surgeon: Selena Daily, MD;  Location: [facility];  Service: Endoscopy;  Laterality: N/A;  sleep apnea   ESOPHAGOGASTRODUODENOSCOPY (EGD) WITH PROPOFOL  N/A 07/09/2019   Procedure: ESOPHAGOGASTRODUODENOSCOPY (EGD) WITH PROPOFOL ;  Surgeon: Selena Daily, MD;  Location: [facility] END[facility]COPY;  Service: Gastroenterology;  Laterality: N/A;   ESOPHAGOGASTRODUODENOSCOPY (EGD) WITH PROPOFOL  N/A 06/15/2022   Procedure: ESOPHAGOGASTRODUODENOSCOPY (EGD) WITH PROPOFOL ;   Surgeon: Selena Daily, MD;  Location: [facility];  Service: Endoscopy;  Laterality: N/A;  sleep apnea   EYE SURGERY Bilateral 2018   Cataract   INGUINAL HERNIA REPAIR Bilateral 01/05/2017   JOINT REPLACEMENT  2021-2022   Both knees   LAPAROSCOPIC BILATERAL SALPINGO OOPHERECTOMY  2019   LUMBAR FUSION  2002   LUMBAR FUSION  11/14/2021   Dr Jaquita Merl   LUMBAR FUSION  12/14/2021   Dr Ruthell Cowboy   SPINAL FUSION  2002   TONSILLECTOMY AND ADENOIDECTOMY  1961   TOTAL KNEE ARTHROPLASTY Right 01/01/2020   Procedure: Right total knee arthroplasty - Thomos Flies to Assist;  Surgeon: Molli Angelucci, MD;  Location: [facility];  Service: Orthopedics;  Laterality: Right;   TOTAL KNEE ARTHROPLASTY Left 10/12/2020   Procedure: TOTAL KNEE ARTHROPLASTY;  Surgeon: Molli Angelucci, MD;  Location: [facility];  Service: Orthopedics;  Laterality: Left;   TUBAL LIGATION  1969   UMBILICAL HERNIA REPAIR N/A 01/05/2017   Social History   Tobacco Use   Smoking status: Never   Smokeless tobacco: Never  Vaping Use   Vaping status: Never Used  Substance Use Topics   Alcohol use: Yes    Comment: very little   Drug use: Never   Family History  Problem Relation Age of Onset   Alcohol abuse Mother    Cancer Mother    Stroke Mother    Alcohol abuse Father    Cancer Father    Arthritis Sister    Arthritis Daughter    COPD Daughter    Arthritis Paternal Grandmother    Asthma Paternal Grandmother    COPD Paternal Grandmother    Breast cancer Paternal Aunt    Birth defects Daughter    Allergies  Allergen Reactions   Bee Venom Anaphylaxis and Hives   Fosamax  [Alendronate ] Nausea Only    Stomach pain   Fusion Plus [Iron -Fa-B Cmp-C-Biot-Probiotic] Nausea Only    Stomach pain   Ibuprofen Other (See Comments)    Abdominal pain   Ciprofloxacin Hcl Rash    Mild rash after long term use   Sulfa Antibiotics Rash    Bactrim   Current Outpatient Medications on File Prior to Visit  Medication  Sig Dispense Refill   albuterol  (VENTOLIN  HFA) 108 (90 Base) MCG/ACT inhaler Inhale 2 puffs into the lungs every 6 (six) hours as  needed for wheezing or shortness of breath. 8 g 5   amitriptyline  (ELAVIL ) 25 MG tablet TAKE 1 TABLET AT BEDTIME 90 tablet 0   amLODipine  (NORVASC ) 5 MG tablet Take 1 tablet (5 mg total) by mouth daily. 90 tablet 2   aspirin  81 MG EC tablet Take 81 mg by mouth at bedtime.      buprenorphine  (BUTRANS ) 10 MCG/HR PTWK Place 1 patch onto the skin once a week for 28 days. 4 patch 0   buprenorphine  (BUTRANS ) 7.5 MCG/HR Place 1 patch onto the skin once a week for 28 days. 4 patch 0   cetirizine (ZYRTEC) 10 MG tablet Take 10 mg by mouth daily as needed for allergies.     Cholecalciferol  (VITAMIN D3) 50 MCG (2000 UT) TABS Take 2,000 Units by mouth daily.     Cyanocobalamin  (VITAMIN B-12) 1000 MCG SUBL Take 2,000 mcg by mouth daily.     EPINEPHrine  0.3 mg/0.3 mL IJ SOAJ injection Inject 0.3 mg into the muscle as needed for anaphylaxis. 2 each 0   fluticasone  (FLONASE ) 50 MCG/ACT nasal spray Place 2 sprays into both nostrils daily. qhs (Patient taking differently: Place 2 sprays into both nostrils daily as needed.) 48 g 0   hydrochlorothiazide  (HYDRODIURIL ) 25 MG tablet TAKE 1 TABLET DAILY 90 tablet 3   Melatonin 10 MG CAPS Take 1 capsule by mouth as needed.     polyethylene glycol (MIRALAX  / GLYCOLAX ) 17 g packet Take 17 g by mouth daily.     pregabalin  (LYRICA ) 150 MG capsule Take one pill by mouth in the am and 2 in the pm (Patient taking differently: 100 mg 2 (two) times daily. Two in a.m.  , three at bedtime) 270 capsule 0   rOPINIRole  (REQUIP ) 1 MG tablet Take 1 mg by mouth in the morning and at bedtime. 1 in am and 1 at 3pm     rOPINIRole  (REQUIP ) 4 MG tablet TAKE 1 TABLET AT BEDTIME 90 tablet 3   simvastatin  (ZOCOR ) 20 MG tablet TAKE 1 TABLET AT BEDTIME 90 tablet 3   tirzepatide  (ZEPBOUND ) 2.5 MG/0.5ML Pen Inject 2.5 mg into the skin once a week. 2 mL 0   tiZANidine   (ZANAFLEX ) 4 MG tablet Take 1 tablet (4 mg total) by mouth 3 (three) times daily as needed for muscle spasms. 90 tablet 1   No current facility-administered medications on file prior to visit.    Review of Systems  Constitutional:  Negative for activity change, appetite change, fatigue, fever and unexpected weight change.  HENT:  Negative for congestion, ear pain, rhinorrhea, sinus pressure and sore throat.   Eyes:  Negative for pain, redness and visual disturbance.  Respiratory:  Negative for cough, shortness of breath and wheezing.   Cardiovascular:  Negative for chest pain and palpitations.  Gastrointestinal:  Negative for abdominal pain, blood in stool, constipation and diarrhea.  Endocrine: Negative for polydipsia and polyuria.  Genitourinary:  Negative for dysuria, frequency and urgency.  Musculoskeletal:  Positive for arthralgias and back pain. Negative for myalgias.  Skin:  Negative for pallor and rash.  Allergic/Immunologic: Negative for environmental allergies.  Neurological:  Negative for dizziness, syncope and headaches.  Hematological:  Negative for adenopathy. Does not bruise/bleed easily.  Psychiatric/Behavioral:  Negative for decreased concentration and dysphoric mood. The patient is not nervous/anxious.        Objective:   Physical Exam Constitutional:      General: She is not in acute distress.    Appearance: Normal appearance. She  is well-developed. She is obese. She is not ill-appearing or diaphoretic.  HENT:     Head: Normocephalic and atraumatic.  Eyes:     Conjunctiva/sclera: Conjunctivae normal.     Pupils: Pupils are equal, round, and reactive to light.  Neck:     Thyroid : No thyromegaly.     Vascular: No carotid bruit or JVD.  Cardiovascular:     Rate and Rhythm: Normal rate and regular rhythm.     Heart sounds: Normal heart sounds.     No gallop.  Pulmonary:     Effort: Pulmonary effort is normal. No respiratory distress.     Breath sounds: Normal  breath sounds. No wheezing or rales.  Abdominal:     General: There is no distension or abdominal bruit.     Palpations: Abdomen is soft.  Musculoskeletal:     Cervical back: Normal range of motion and neck supple.     Right lower leg: No edema.     Left lower leg: No edema.  Lymphadenopathy:     Cervical: No cervical adenopathy.  Skin:    General: Skin is warm and dry.     Coloration: Skin is not pale.     Findings: No rash.  Neurological:     Mental Status: She is alert.     Coordination: Coordination normal.     Deep Tendon Reflexes: Reflexes are normal and symmetric. Reflexes normal.  Psychiatric:        Mood and Affect: Mood normal.           Assessment & Plan:   Problem List Items Addressed This Visit       Cardiovascular and Mediastinum   Mild pulmonary hypertension (HCC)   Being treated for osa now as well      Benign essential hypertension   bp in fair control at this time  BP Readings from Last 1 Encounters:  07/05/23 130/70  Working on weight loss  Most recent labs reviewed  Disc lifstyle change with low sodium diet and exercise  Plan to continue Amlofipine 5 mg daily Hydrochlorothiazide  25 mg daily           Respiratory   OSA on CPAP   Working on weight loss with diet/exercise and zepbound          Other   Prediabetes   Lab Results  Component Value Date   HGBA1C 6.1 (H) 05/02/2023   HGBA1C 5.9 (H) 08/04/2022   HGBA1C 6.3 01/31/2022   disc imp of low glycemic diet and wt loss to prevent DM2  Taking zepbound  now and titrating up as tolerated       Class 2 severe obesity due to excess calories with serious comorbidity and body mass index (BMI) of 38.0 to 38.9 in adult [facility]) - Primary   Discussed how this problem influences overall health and the risks it imposes  Reviewed plan for weight loss with lower calorie diet (via better food choices (lower glycemic and portion control) along with exercise building up to or more than 30 minutes 5  days per week including some aerobic activity and strength training   Working on this  Finishing first mo of zepbound  at 2.5 mg weekly  Will go up to 5 mg (will be out of state, printed) and then titrate up as tolerated  Reiterated importance of strength training to prevent muscle loss   Also discussed ways to deal with emotional eating  She is currently looking for a counselor  Relevant Medications   tirzepatide  (ZEPBOUND ) 5 MG/0.5ML Pen   Chronic pain syndrome   In pain management  About to start buprenorphine  patch Takes pregabalin  as well

## 2023-07-05 NOTE — Assessment & Plan Note (Signed)
 bp in fair control at this time  BP Readings from Last 1 Encounters:  07/05/23 130/70  Working on weight loss  Most recent labs reviewed  Disc lifstyle change with low sodium diet and exercise  Plan to continue Amlofipine 5 mg daily Hydrochlorothiazide  25 mg daily

## 2023-07-05 NOTE — Patient Instructions (Addendum)
 You will need to figure out what kind of strength/muscle building exercise you can do   Stay active- cardio (pedal/ swim/ walk)  We need resistance training also  Add some strength training to your routine, this is important for bone and brain health and can reduce your risk of falls and help your body use insulin  properly and regulate weight  Light weights, exercise bands , and internet videos are a good way to start  Yoga (chair or regular), machines , floor exercises or a gym with machines are also good options    Here is the prescription for zepbound  5 mg to start next -you can get if filled out of town  Let us  know in a month when you are ready for a refill   Try to get most of your carbohydrates from produce (with the exception of white potatoes) and whole grains Eat less bread/pasta/rice/snack foods/cereals/sweets and other items from the middle of the grocery store (processed carbs)  Try and resist emotional eating  It helps to use a journal   Have a good trip and stay safe   Follow up here in about 3 months

## 2023-07-05 NOTE — Telephone Encounter (Signed)
 Pharmacy Patient Advocate Encounter   Received notification from Patient Pharmacy that prior authorization for Zepbound  is required/requested.   Insurance verification completed.   The patient is insured through [employer] .   Per test claim: The current 28 day co-pay is, $452.72.  No PA needed at this time. This test claim was processed through [facility]- copay amounts may vary at other pharmacies due to pharmacy/plan contracts, or as the patient moves through the different stages of their insurance plan.

## 2023-07-05 NOTE — Assessment & Plan Note (Signed)
 Lab Results  Component Value Date   HGBA1C 6.1 (H) 05/02/2023   HGBA1C 5.9 (H) 08/04/2022   HGBA1C 6.3 01/31/2022   disc imp of low glycemic diet and wt loss to prevent DM2  Taking zepbound  now and titrating up as tolerated

## 2023-07-05 NOTE — Assessment & Plan Note (Addendum)
 Discussed how this problem influences overall health and the risks it imposes  Reviewed plan for weight loss with lower calorie diet (via better food choices (lower glycemic and portion control) along with exercise building up to or more than 30 minutes 5 days per week including some aerobic activity and strength training   Working on this  Finishing first mo of zepbound  at 2.5 mg weekly  Will go up to 5 mg (will be out of state, printed) and then titrate up as tolerated  Reiterated importance of strength training to prevent muscle loss   Also discussed ways to deal with emotional eating  She is currently looking for a counselor

## 2023-07-05 NOTE — Assessment & Plan Note (Signed)
 In pain management  About to start buprenorphine  patch Takes pregabalin  as well

## 2023-07-06 ENCOUNTER — Ambulatory Visit: Admitting: Family Medicine

## 2023-07-12 ENCOUNTER — Ambulatory Visit: Admitting: Family Medicine

## 2023-07-13 ENCOUNTER — Encounter: Payer: Self-pay | Admitting: Student in an Organized Health Care Education/Training Program

## 2023-07-16 ENCOUNTER — Encounter: Payer: Self-pay | Admitting: *Deleted

## 2023-07-16 ENCOUNTER — Encounter: Payer: Self-pay | Admitting: Family Medicine

## 2023-07-16 MED ORDER — TIRZEPATIDE-WEIGHT MANAGEMENT 7.5 MG/0.5ML ~~LOC~~ SOLN: 7.5000 mg | SUBCUTANEOUS | 0 refills | Status: AC

## 2023-07-16 NOTE — Telephone Encounter (Signed)
 Pharmacy updated. Pt is out of state right now

## 2023-07-17 ENCOUNTER — Encounter: Payer: Self-pay | Admitting: *Deleted

## 2023-07-24 ENCOUNTER — Other Ambulatory Visit: Payer: Self-pay | Admitting: Family Medicine

## 2023-07-24 ENCOUNTER — Other Ambulatory Visit: Payer: Self-pay | Admitting: Student in an Organized Health Care Education/Training Program

## 2023-07-24 DIAGNOSIS — Z981 Arthrodesis status: Secondary | ICD-10-CM

## 2023-07-24 DIAGNOSIS — M961 Postlaminectomy syndrome, not elsewhere classified: Secondary | ICD-10-CM

## 2023-07-24 DIAGNOSIS — G8929 Other chronic pain: Secondary | ICD-10-CM

## 2023-07-24 DIAGNOSIS — G894 Chronic pain syndrome: Secondary | ICD-10-CM

## 2023-07-24 NOTE — Telephone Encounter (Signed)
 Last filled on 05/29/23 #90 tabs/ 1 refill  F/u scheduled on 08/28/23

## 2023-08-03 ENCOUNTER — Telehealth: Payer: Self-pay

## 2023-08-03 ENCOUNTER — Other Ambulatory Visit (HOSPITAL_COMMUNITY): Payer: Self-pay

## 2023-08-03 MED ORDER — TIRZEPATIDE 10 MG/0.5ML ~~LOC~~ SOAJ: 10.0000 mg | SUBCUTANEOUS | 0 refills | Status: DC

## 2023-08-03 NOTE — Telephone Encounter (Signed)
 Ozempic/Mounjaro  is approved exclusively as an adjunct to diet and exercise to improve glycemic  control in adults with type 2 diabetes mellitus. A review of patient's medical chart reveals no  documented diagnosis of type 2 diabetes or an A1C indicative of diabetes. Therefore, they do not  currently meet the criteria for prior authorization of this medication. If clinically appropriate, alternative  options such as Saxenda, Zepbound , or Frederik Jansky may be considered for this patient.   Patient has been on Zepbound  and last filled 07/25/23

## 2023-08-03 NOTE — Addendum Note (Signed)
 Addended by: Deri Fleet A on: 08/03/2023 10:46 AM   Modules accepted: Orders

## 2023-08-08 ENCOUNTER — Encounter: Payer: Medicare Other | Admitting: Internal Medicine

## 2023-08-09 ENCOUNTER — Other Ambulatory Visit: Payer: Medicare Other

## 2023-08-09 ENCOUNTER — Encounter: Payer: Self-pay | Admitting: Family Medicine

## 2023-08-10 ENCOUNTER — Other Ambulatory Visit (HOSPITAL_COMMUNITY): Payer: Self-pay

## 2023-08-10 ENCOUNTER — Other Ambulatory Visit: Payer: Self-pay | Admitting: Family Medicine

## 2023-08-10 MED ORDER — ZEPBOUND 10 MG/0.5ML ~~LOC~~ SOAJ: 10.0000 mg | SUBCUTANEOUS | 0 refills | Status: AC

## 2023-08-10 NOTE — Telephone Encounter (Signed)
 Pt sent a message asking about PA on med saying injection is due today. Please review prev message from Prior auth team. ? If pt needs to stay on zepbound  since Mounjaro  was declined

## 2023-08-10 NOTE — Telephone Encounter (Signed)
 I sent it as zepbound    I sent to [facility] as [facility] -if she needs a different pharmacy please re send I am currently out of the office

## 2023-08-10 NOTE — Telephone Encounter (Signed)
 Will route to PA dpt. Per pt Mounjaro  needs a PA also

## 2023-08-10 NOTE — Telephone Encounter (Signed)
 Sent mychart letting pt know  ?

## 2023-08-10 NOTE — Addendum Note (Signed)
 Addended by: Deri Fleet A on: 08/10/2023 09:30 AM   Modules accepted: Orders

## 2023-08-15 ENCOUNTER — Other Ambulatory Visit

## 2023-08-16 ENCOUNTER — Ambulatory Visit: Payer: Medicare Other | Admitting: Physician Assistant

## 2023-08-21 ENCOUNTER — Ambulatory Visit: Admitting: Family Medicine

## 2023-08-22 ENCOUNTER — Encounter: Admitting: Internal Medicine

## 2023-08-22 ENCOUNTER — Encounter: Payer: Self-pay | Admitting: Oncology

## 2023-08-22 ENCOUNTER — Inpatient Hospital Stay: Attending: Oncology

## 2023-08-22 DIAGNOSIS — D509 Iron deficiency anemia, unspecified: Secondary | ICD-10-CM | POA: Diagnosis not present

## 2023-08-22 LAB — IRON AND TIBC
Iron: 47 ug/dL (ref 28–170)
Saturation Ratios: 16 % (ref 10.4–31.8)
TIBC: 302 ug/dL (ref 250–450)
UIBC: 255 ug/dL

## 2023-08-22 LAB — CBC (CANCER CENTER ONLY)
HCT: 44.2 % (ref 36.0–46.0)
Hemoglobin: 14.8 g/dL (ref 12.0–15.0)
MCH: 27.2 pg (ref 26.0–34.0)
MCHC: 33.5 g/dL (ref 30.0–36.0)
MCV: 81.3 fL (ref 80.0–100.0)
Platelet Count: 306 10*3/uL (ref 150–400)
RBC: 5.44 MIL/uL — ABNORMAL HIGH (ref 3.87–5.11)
RDW: 13.2 % (ref 11.5–15.5)
WBC Count: 5.8 10*3/uL (ref 4.0–10.5)
nRBC: 0 % (ref 0.0–0.2)

## 2023-08-22 LAB — FERRITIN: Ferritin: 129 ng/mL (ref 11–307)

## 2023-08-23 ENCOUNTER — Encounter: Admitting: Student in an Organized Health Care Education/Training Program

## 2023-08-24 ENCOUNTER — Ambulatory Visit: Admitting: Physician Assistant

## 2023-08-28 ENCOUNTER — Ambulatory Visit: Admitting: Family Medicine

## 2023-09-04 DIAGNOSIS — G4733 Obstructive sleep apnea (adult) (pediatric): Secondary | ICD-10-CM | POA: Diagnosis not present

## 2023-10-02 ENCOUNTER — Other Ambulatory Visit: Payer: Self-pay | Admitting: Family Medicine

## 2023-11-06 ENCOUNTER — Other Ambulatory Visit: Payer: Self-pay | Admitting: Family Medicine

## 2023-11-06 NOTE — Telephone Encounter (Signed)
 Last filled on 07/24/23 #90 tab/ 2 refills   Last OV was 07/05/23 no future appt

## 2023-11-07 ENCOUNTER — Encounter: Payer: Self-pay | Admitting: Oncology

## 2023-11-30 ENCOUNTER — Telehealth: Payer: Self-pay | Admitting: Oncology

## 2023-11-30 NOTE — Telephone Encounter (Signed)
 Pt moved to [place]  and canceled all future appts here.

## 2023-12-07 ENCOUNTER — Ambulatory Visit: Payer: Medicare Other | Admitting: Oncology

## 2023-12-07 ENCOUNTER — Other Ambulatory Visit: Payer: Medicare Other

## 2024-01-17 ENCOUNTER — Telehealth: Payer: Self-pay | Admitting: Family Medicine

## 2024-01-17 NOTE — Telephone Encounter (Signed)
 Contacted Erin Petersen to schedule their annual wellness visit. Patient declined to schedule AWV at this time. Patient has moved to [place] , she has transferred care there.  [facility] Guide [facility] AWV TEAM Direct Dial: [phone]

## 2024-02-07 NOTE — Telephone Encounter (Signed)
 PCP removed.
